# Patient Record
Sex: Male | Born: 1945
Health system: Southern US, Community
[De-identification: ages and names within clinical notes are randomized; demographics above are authoritative.]

## PROBLEM LIST (undated history)

## (undated) DIAGNOSIS — R51 Headache: Secondary | ICD-10-CM

## (undated) DIAGNOSIS — Z951 Presence of aortocoronary bypass graft: Secondary | ICD-10-CM

## (undated) DIAGNOSIS — I480 Paroxysmal atrial fibrillation: Secondary | ICD-10-CM

## (undated) DIAGNOSIS — R0602 Shortness of breath: Secondary | ICD-10-CM

## (undated) DIAGNOSIS — I5030 Unspecified diastolic (congestive) heart failure: Secondary | ICD-10-CM

## (undated) DIAGNOSIS — Z5189 Encounter for other specified aftercare: Secondary | ICD-10-CM

## (undated) DIAGNOSIS — J40 Bronchitis, not specified as acute or chronic: Secondary | ICD-10-CM

## (undated) DIAGNOSIS — IMO0001 Reserved for inherently not codable concepts without codable children: Secondary | ICD-10-CM

## (undated) DIAGNOSIS — IMO0002 Reserved for concepts with insufficient information to code with codable children: Secondary | ICD-10-CM

## (undated) DIAGNOSIS — K228 Other specified diseases of esophagus: Secondary | ICD-10-CM

## (undated) DIAGNOSIS — I4510 Unspecified right bundle-branch block: Secondary | ICD-10-CM

## (undated) DIAGNOSIS — I350 Nonrheumatic aortic (valve) stenosis: Secondary | ICD-10-CM

## (undated) DIAGNOSIS — I4891 Unspecified atrial fibrillation: Secondary | ICD-10-CM

## (undated) DIAGNOSIS — D649 Anemia, unspecified: Secondary | ICD-10-CM

## (undated) DIAGNOSIS — I499 Cardiac arrhythmia, unspecified: Secondary | ICD-10-CM

## (undated) DIAGNOSIS — K222 Esophageal obstruction: Secondary | ICD-10-CM

## (undated) DIAGNOSIS — G8929 Other chronic pain: Secondary | ICD-10-CM

## (undated) DIAGNOSIS — I219 Acute myocardial infarction, unspecified: Secondary | ICD-10-CM

## (undated) DIAGNOSIS — K449 Diaphragmatic hernia without obstruction or gangrene: Secondary | ICD-10-CM

## (undated) DIAGNOSIS — I2510 Atherosclerotic heart disease of native coronary artery without angina pectoris: Secondary | ICD-10-CM

## (undated) DIAGNOSIS — E785 Hyperlipidemia, unspecified: Secondary | ICD-10-CM

## (undated) DIAGNOSIS — K2289 Other specified disease of esophagus: Secondary | ICD-10-CM

## (undated) DIAGNOSIS — K219 Gastro-esophageal reflux disease without esophagitis: Secondary | ICD-10-CM

## (undated) DIAGNOSIS — T4145XA Adverse effect of unspecified anesthetic, initial encounter: Secondary | ICD-10-CM

## (undated) DIAGNOSIS — I701 Atherosclerosis of renal artery: Secondary | ICD-10-CM

## (undated) DIAGNOSIS — I771 Stricture of artery: Secondary | ICD-10-CM

## (undated) DIAGNOSIS — I10 Essential (primary) hypertension: Secondary | ICD-10-CM

## (undated) DIAGNOSIS — N189 Chronic kidney disease, unspecified: Secondary | ICD-10-CM

## (undated) DIAGNOSIS — K311 Adult hypertrophic pyloric stenosis: Secondary | ICD-10-CM

## (undated) DIAGNOSIS — K635 Polyp of colon: Secondary | ICD-10-CM

## (undated) DIAGNOSIS — R011 Cardiac murmur, unspecified: Secondary | ICD-10-CM

## (undated) DIAGNOSIS — M549 Dorsalgia, unspecified: Secondary | ICD-10-CM

## (undated) DIAGNOSIS — I509 Heart failure, unspecified: Secondary | ICD-10-CM

## (undated) DIAGNOSIS — I209 Angina pectoris, unspecified: Secondary | ICD-10-CM

## (undated) HISTORY — DX: Esophageal obstruction: K22.2

## (undated) HISTORY — DX: Paroxysmal atrial fibrillation: I48.0

## (undated) HISTORY — DX: Unspecified atrial fibrillation: I48.91

## (undated) HISTORY — DX: Polyp of colon: K63.5

## (undated) HISTORY — PX: ADENOIDECTOMY: SUR15

## (undated) HISTORY — DX: Atherosclerosis of renal artery: I70.1

## (undated) HISTORY — PX: OTHER SURGICAL HISTORY: SHX169

## (undated) HISTORY — DX: Presence of aortocoronary bypass graft: Z95.1

## (undated) HISTORY — DX: Unspecified right bundle-branch block: I45.10

## (undated) HISTORY — DX: Adult hypertrophic pyloric stenosis: K31.1

---
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
## 1970-03-07 HISTORY — PX: TONSILLECTOMY: SUR1361

## 1982-03-07 HISTORY — PX: CORONARY ARTERY BYPASS GRAFT: SHX141

## 1989-03-07 DIAGNOSIS — I219 Acute myocardial infarction, unspecified: Secondary | ICD-10-CM

## 1989-03-07 HISTORY — DX: Acute myocardial infarction, unspecified: I21.9

## 1994-03-07 HISTORY — PX: CAROTID ENDARTERECTOMY: SUR193

## 1994-03-07 HISTORY — PX: CORONARY ARTERY BYPASS GRAFT: SHX141

## 1996-02-13 ENCOUNTER — Encounter: Payer: Self-pay | Admitting: Internal Medicine

## 1998-03-09 ENCOUNTER — Encounter (INDEPENDENT_AMBULATORY_CARE_PROVIDER_SITE_OTHER): Payer: Self-pay | Admitting: *Deleted

## 1998-03-19 ENCOUNTER — Encounter (INDEPENDENT_AMBULATORY_CARE_PROVIDER_SITE_OTHER): Payer: Self-pay | Admitting: *Deleted

## 1998-04-07 ENCOUNTER — Encounter: Payer: Self-pay | Admitting: Internal Medicine

## 1998-04-07 ENCOUNTER — Ambulatory Visit (HOSPITAL_COMMUNITY): Admission: RE | Admit: 1998-04-07 | Discharge: 1998-04-07 | Payer: Self-pay | Admitting: Internal Medicine

## 1998-04-28 ENCOUNTER — Ambulatory Visit (HOSPITAL_COMMUNITY): Admission: RE | Admit: 1998-04-28 | Discharge: 1998-04-28 | Payer: Self-pay | Admitting: Internal Medicine

## 1998-04-28 ENCOUNTER — Encounter: Payer: Self-pay | Admitting: Internal Medicine

## 1998-05-26 ENCOUNTER — Encounter: Payer: Self-pay | Admitting: Internal Medicine

## 1998-05-26 ENCOUNTER — Ambulatory Visit (HOSPITAL_COMMUNITY): Admission: RE | Admit: 1998-05-26 | Discharge: 1998-05-26 | Payer: Self-pay | Admitting: Internal Medicine

## 1998-06-11 ENCOUNTER — Observation Stay (HOSPITAL_COMMUNITY): Admission: RE | Admit: 1998-06-11 | Discharge: 1998-06-12 | Payer: Self-pay | Admitting: Cardiovascular Disease

## 1998-06-11 ENCOUNTER — Encounter: Payer: Self-pay | Admitting: Cardiovascular Disease

## 1998-07-22 ENCOUNTER — Encounter: Payer: Self-pay | Admitting: Internal Medicine

## 1998-09-01 ENCOUNTER — Encounter: Payer: Self-pay | Admitting: Internal Medicine

## 1998-09-01 ENCOUNTER — Ambulatory Visit (HOSPITAL_COMMUNITY): Admission: RE | Admit: 1998-09-01 | Discharge: 1998-09-01 | Payer: Self-pay | Admitting: Internal Medicine

## 2001-12-24 ENCOUNTER — Encounter: Payer: Self-pay | Admitting: Internal Medicine

## 2002-01-02 ENCOUNTER — Encounter: Payer: Self-pay | Admitting: Internal Medicine

## 2002-01-14 ENCOUNTER — Encounter: Payer: Self-pay | Admitting: Internal Medicine

## 2002-01-14 ENCOUNTER — Ambulatory Visit (HOSPITAL_COMMUNITY): Admission: RE | Admit: 2002-01-14 | Discharge: 2002-01-14 | Payer: Self-pay | Admitting: Internal Medicine

## 2002-02-22 ENCOUNTER — Ambulatory Visit (HOSPITAL_COMMUNITY): Admission: RE | Admit: 2002-02-22 | Discharge: 2002-02-22 | Payer: Self-pay | Admitting: Internal Medicine

## 2002-02-22 ENCOUNTER — Encounter: Payer: Self-pay | Admitting: Internal Medicine

## 2003-06-10 ENCOUNTER — Encounter: Payer: Self-pay | Admitting: Internal Medicine

## 2003-06-23 ENCOUNTER — Ambulatory Visit (HOSPITAL_COMMUNITY): Admission: RE | Admit: 2003-06-23 | Discharge: 2003-06-23 | Payer: Self-pay | Admitting: Internal Medicine

## 2003-06-23 ENCOUNTER — Encounter: Payer: Self-pay | Admitting: Internal Medicine

## 2003-06-25 ENCOUNTER — Inpatient Hospital Stay (HOSPITAL_COMMUNITY): Admission: AD | Admit: 2003-06-25 | Discharge: 2003-06-26 | Payer: Self-pay | Admitting: Internal Medicine

## 2003-07-11 ENCOUNTER — Encounter (INDEPENDENT_AMBULATORY_CARE_PROVIDER_SITE_OTHER): Payer: Self-pay | Admitting: *Deleted

## 2003-07-11 ENCOUNTER — Ambulatory Visit (HOSPITAL_COMMUNITY): Admission: RE | Admit: 2003-07-11 | Discharge: 2003-07-11 | Payer: Self-pay | Admitting: Internal Medicine

## 2003-08-03 ENCOUNTER — Inpatient Hospital Stay (HOSPITAL_COMMUNITY): Admission: AD | Admit: 2003-08-03 | Discharge: 2003-08-06 | Payer: Self-pay | Admitting: Cardiology

## 2003-12-10 ENCOUNTER — Inpatient Hospital Stay (HOSPITAL_COMMUNITY): Admission: AD | Admit: 2003-12-10 | Discharge: 2003-12-18 | Payer: Self-pay | Admitting: Cardiology

## 2004-11-10 ENCOUNTER — Ambulatory Visit: Payer: Self-pay | Admitting: Internal Medicine

## 2005-12-29 ENCOUNTER — Ambulatory Visit: Payer: Self-pay | Admitting: Internal Medicine

## 2007-01-18 ENCOUNTER — Ambulatory Visit: Payer: Self-pay | Admitting: Internal Medicine

## 2007-03-08 HISTORY — PX: EYE SURGERY: SHX253

## 2007-04-08 HISTORY — PX: RETINAL DETACHMENT SURGERY: SHX105

## 2007-04-24 DIAGNOSIS — K219 Gastro-esophageal reflux disease without esophagitis: Secondary | ICD-10-CM

## 2007-04-24 DIAGNOSIS — I10 Essential (primary) hypertension: Secondary | ICD-10-CM

## 2007-04-24 DIAGNOSIS — K649 Unspecified hemorrhoids: Secondary | ICD-10-CM | POA: Insufficient documentation

## 2007-04-24 DIAGNOSIS — K319 Disease of stomach and duodenum, unspecified: Secondary | ICD-10-CM

## 2007-04-24 DIAGNOSIS — Z951 Presence of aortocoronary bypass graft: Secondary | ICD-10-CM

## 2007-04-24 DIAGNOSIS — K208 Other esophagitis: Secondary | ICD-10-CM

## 2007-04-24 DIAGNOSIS — E785 Hyperlipidemia, unspecified: Secondary | ICD-10-CM

## 2007-07-20 ENCOUNTER — Encounter: Payer: Self-pay | Admitting: Internal Medicine

## 2008-01-14 ENCOUNTER — Telehealth: Payer: Self-pay | Admitting: Internal Medicine

## 2008-01-30 ENCOUNTER — Ambulatory Visit: Payer: Self-pay | Admitting: Internal Medicine

## 2008-01-30 DIAGNOSIS — K2210 Ulcer of esophagus without bleeding: Secondary | ICD-10-CM | POA: Insufficient documentation

## 2008-01-30 DIAGNOSIS — K222 Esophageal obstruction: Secondary | ICD-10-CM

## 2008-06-26 ENCOUNTER — Telehealth: Payer: Self-pay | Admitting: Internal Medicine

## 2008-07-03 ENCOUNTER — Encounter: Payer: Self-pay | Admitting: Internal Medicine

## 2009-03-12 ENCOUNTER — Ambulatory Visit: Payer: Self-pay | Admitting: Internal Medicine

## 2009-03-12 DIAGNOSIS — R141 Gas pain: Secondary | ICD-10-CM

## 2009-03-12 DIAGNOSIS — R143 Flatulence: Secondary | ICD-10-CM

## 2009-03-12 DIAGNOSIS — R142 Eructation: Secondary | ICD-10-CM

## 2009-03-12 DIAGNOSIS — K4090 Unilateral inguinal hernia, without obstruction or gangrene, not specified as recurrent: Secondary | ICD-10-CM | POA: Insufficient documentation

## 2009-06-15 ENCOUNTER — Encounter: Payer: Self-pay | Admitting: Internal Medicine

## 2009-07-27 ENCOUNTER — Telehealth: Payer: Self-pay | Admitting: Internal Medicine

## 2009-07-29 ENCOUNTER — Encounter: Payer: Self-pay | Admitting: Internal Medicine

## 2009-08-06 ENCOUNTER — Telehealth: Payer: Self-pay | Admitting: Internal Medicine

## 2009-08-06 ENCOUNTER — Ambulatory Visit: Payer: Self-pay | Admitting: Internal Medicine

## 2009-08-06 DIAGNOSIS — R110 Nausea: Secondary | ICD-10-CM

## 2009-08-06 DIAGNOSIS — R1084 Generalized abdominal pain: Secondary | ICD-10-CM | POA: Insufficient documentation

## 2009-08-06 DIAGNOSIS — R109 Unspecified abdominal pain: Secondary | ICD-10-CM

## 2009-08-07 ENCOUNTER — Ambulatory Visit: Payer: Self-pay | Admitting: Cardiovascular Disease

## 2009-08-07 LAB — CONVERTED CEMR LAB
ALT: 17 units/L (ref 0–53)
AST: 19 units/L (ref 0–37)
Alkaline Phosphatase: 81 units/L (ref 39–117)
Amylase: 86 units/L (ref 27–131)
Basophils Absolute: 0 10*3/uL (ref 0.0–0.1)
Creatinine, Ser: 1.2 mg/dL (ref 0.4–1.5)
Eosinophils Absolute: 0.1 10*3/uL (ref 0.0–0.7)
Eosinophils Relative: 2.4 % (ref 0.0–5.0)
HCT: 39 % (ref 39.0–52.0)
Hemoglobin: 13.7 g/dL (ref 13.0–17.0)
Lipase: 10 units/L — ABNORMAL LOW (ref 11.0–59.0)
Lymphs Abs: 1.5 10*3/uL (ref 0.7–4.0)
MCV: 98.1 fL (ref 78.0–100.0)
Platelets: 155 10*3/uL (ref 150.0–400.0)
RDW: 13 % (ref 11.5–14.6)
Total Bilirubin: 0.6 mg/dL (ref 0.3–1.2)
Total Protein: 6.7 g/dL (ref 6.0–8.3)

## 2009-08-20 ENCOUNTER — Encounter: Payer: Self-pay | Admitting: Internal Medicine

## 2009-09-24 ENCOUNTER — Ambulatory Visit: Payer: Self-pay | Admitting: Internal Medicine

## 2010-01-19 ENCOUNTER — Telehealth: Payer: Self-pay | Admitting: Internal Medicine

## 2010-01-25 ENCOUNTER — Ambulatory Visit: Payer: Self-pay | Admitting: Internal Medicine

## 2010-01-25 DIAGNOSIS — K5900 Constipation, unspecified: Secondary | ICD-10-CM | POA: Insufficient documentation

## 2010-01-26 ENCOUNTER — Telehealth: Payer: Self-pay | Admitting: Internal Medicine

## 2010-01-27 ENCOUNTER — Encounter: Payer: Self-pay | Admitting: Internal Medicine

## 2010-02-04 ENCOUNTER — Ambulatory Visit: Payer: Self-pay | Admitting: Internal Medicine

## 2010-02-08 ENCOUNTER — Encounter: Payer: Self-pay | Admitting: Internal Medicine

## 2010-03-28 ENCOUNTER — Encounter: Payer: Self-pay | Admitting: Internal Medicine

## 2010-03-30 ENCOUNTER — Telehealth (INDEPENDENT_AMBULATORY_CARE_PROVIDER_SITE_OTHER): Payer: Self-pay | Admitting: *Deleted

## 2010-03-30 ENCOUNTER — Ambulatory Visit
Admission: RE | Admit: 2010-03-30 | Discharge: 2010-03-30 | Payer: Self-pay | Source: Home / Self Care | Attending: Internal Medicine | Admitting: Internal Medicine

## 2010-04-06 NOTE — Procedures (Signed)
Summary: [facility]  [facility]   Imported By: Phillis Knack 03/13/2009 08:32:33  _____________________________________________________________________  External Attachment:    Type:   Image     Comment:   External Document

## 2010-04-06 NOTE — Assessment & Plan Note (Signed)
Summary: Followup abd pain constipation   History of Present Illness Visit Type: Follow-up Visit Primary GI MD: Scarlette Shorts MD Primary Provider: Dewitt Hoes, MD Chief Complaint: abdominal pain is better, as is constipation.  Pt is having soft, formed stools. History of Present Illness:   65 year old white male with multiple medical problems including hyperlipidemia, hypertension, coronary artery disease status post CABG, status post enter coronary stent placement, GERD complicated by peptic stricture, erosive esophagitis, and hemorrhoids. He presents today after a recent evaluation for abdominal pain. As part of his workup he underwent multiple laboratories which were unremarkable. Also, a CT scan of the abdomen and pelvis which revealed vascular calcification and atheromatous changes as well as questionable narrowing at the takeoff of the celiac axis. He was also noted to have increased stool burden and complaint of constipation. Post CT scan 7 weeks ago, he has had no abdominal pain. He states he had multiple bowel movements after his CT contrast. He has been using MiraLax with good results. No other complaints or issues. His reflux is under good control with b.i.d. Nexium. No dysphagia.   GI Review of Systems    Reports abdominal pain.     Location of  Abdominal pain: lower abdomen.    Denies acid reflux, belching, bloating, chest pain, dysphagia with liquids, dysphagia with solids, heartburn, loss of appetite, nausea, vomiting, vomiting blood, weight loss, and  weight gain.      Reports constipation.     Denies anal fissure, black tarry stools, change in bowel habit, diarrhea, diverticulosis, fecal incontinence, heme positive stool, hemorrhoids, irritable bowel syndrome, jaundice, light color stool, liver problems, rectal bleeding, and  rectal pain.    Current Medications (verified): 1)  Nexium 40 Mg Cpdr (Esomeprazole Magnesium) .... Take 1 Tablet By Mouth Two Times A Day 2)  Atenolol 25  Mg Tabs (Atenolol) .... Take 1 Tablet Every Morning and 1/2 Tablet By Mouth Every Night. 3)  Nitro Pump Spray 0.4 Mg .... Take 1 Spray As Needed 4)  Plavix 75 Mg Tabs (Clopidogrel Bisulfate) .... Take 1 Tablet By Mouth Once A Day 5)  Aspirin 81 Mg Tbec (Aspirin) .... Take 1 Tablet By Mouth Once A Day 6)  Fiorinal 50-325-40 Mg Caps (Butalbital-Aspirin-Caffeine) .... Take 1 Tablet By Mouth As Needed 7)  Multivitamins  Tabs (Multiple Vitamin) .... Take 1 Tablet By Mouth Once A Day 8)  Vitamin E 200 Unit Caps (Vitamin E) .... Take 1 Tablet By Mouth Once A Day 9)  Lovastatin 40 Mg Tabs (Lovastatin) .... Take 1 Tablet By Mouth Once A Day 10)  Norvasc 2.5 Mg Tabs (Amlodipine Besylate) .... Take 1 Tablet By Mouth Two Times A Day 11)  Promethazine Hcl 25 Mg Tabs (Promethazine Hcl) .... Take 1/2 To 1 Tab  Every 6 Hours As Needed For Nausea 12)  Carafate 1 Gm/76ml Susp (Sucralfate) .... Take 1 Gram Between Meals and At Bedtime  Allergies (verified): 1)  ! Sulfa  Past History:  Past Medical History: Reviewed history from 08/06/2009 and no changes required. Current Problems:  HYPERLIPIDEMIA (ICD-272.4) HYPERTENSION (ICD-401.9) HEMORRHOIDS (ICD-455.6) CORONARY ARTERY DISEASE (ICD-414.00)-S/P CABG, AND MULTIPLE STENTS MYOCARDIAL INFARCTION, HX OF (ICD-412) PEPTIC STRICTURE (ICD-537.89) EROSIVE ESOPHAGITIS (ICD-530.19) GASTROESOPHAGEAL REFLUX DISEASE (ICD-530.81)  Past Surgical History: Reviewed history from 03/12/2009 and no changes required. coronary artery stenting (7 stents) quadruple bypass x 2 (1984, 1996) appendectomy pylondial cyst removal   Family History: No FH of Colon Cancer Family History of Heart Disease: father Family History of Diabetes: Sister  Social History: Married, 2 boys Retired Patient is a former smoker. -stopped 2006 Alcohol Use - no Illicit Drug Use - no Patient gets regular exercise.  Review of Systems  The patient denies allergy/sinus, anemia,  anxiety-new, arthritis/joint pain, back pain, blood in urine, breast changes/lumps, confusion, cough, coughing up blood, depression-new, fainting, fatigue, fever, headaches-new, hearing problems, heart murmur, heart rhythm changes, itching, muscle pains/cramps, night sweats, nosebleeds, shortness of breath, skin rash, sleeping problems, sore throat, swelling of feet/legs, swollen lymph glands, thirst - excessive, urination - excessive, urination changes/pain, urine leakage, vision changes, and voice change.    Vital Signs:  Patient profile:   65 year old male Height:      67 inches Weight:      152 pounds BMI:     23.89 Pulse rate:   56 / minute Pulse rhythm:   regular BP sitting:   120 / 64  (right arm) Cuff size:   regular  Vitals Entered By: Abelino Derrick CMA Deborra Medina) (September 24, 2009 9:47 AM)  Physical Exam  General:  Well developed, well nourished, no acute distress. Head:  Normocephalic and atraumatic. Eyes:  PERRLA, no icterus. Mouth:  no thrush Neck:  Supple; no masses or thyromegaly. Lungs:  Clear throughout to auscultation. Heart:  Regular rate and rhythm; no murmurs, rubs,  or bruits. Abdomen:  Soft, nontender and nondistended. No masses, hepatosplenomegaly or hernias noted. Normal bowel sounds. Pulses:  Normal pulses noted. Extremities:  no edema Neurologic:  alert and oriented Skin:  no jaundice Psych:  Alert and cooperative. Normal mood and affect.   Impression & Recommendations:  Problem # 1:  ABDOMINAL PAIN-MULTIPLE SITES (ICD-789.09) problems with abdominal pain seemingly secondary to constipation. Problem resolved with bowel regulation from MiraLax. Negative extensive workup otherwise.  Plan: #1. Continue MiraLax. Titrate to need #2. Followup p.r.n. recurrent pain  Problem # 2:  GASTROESOPHAGEAL REFLUX DISEASE (123456) GERD complicated by erosive esophagitis and peptic stricture. Asymptomatic post dilation on Nexium.   Plan: #1. Continue Nexium 40  mg b.i.d. #2. Repeat esophageal dilation p.r.n. recurrent dysphagia #3. Routine office followup in about one year  Patient Instructions: 1)  Please schedule a follow-up appointment in 1 year. 2)  The medication list was reviewed and reconciled.  All changed / newly prescribed medications were explained.  A complete medication list was provided to the patient / caregiver. 3)  Copy: Dr. Dewitt Hoes

## 2010-04-06 NOTE — Letter (Signed)
Summary: [facility]   Imported By: Phillis Knack 03/13/2009 08:55:56  _____________________________________________________________________  External Attachment:    Type:   Image     Comment:   External Document

## 2010-04-06 NOTE — Procedures (Signed)
Summary: [facility]  [facility]   Imported By: Phillis Knack 03/13/2009 08:35:19  _____________________________________________________________________  External Attachment:    Type:   Image     Comment:   External Document

## 2010-04-06 NOTE — Op Note (Signed)
Summary: [facility]   Imported By: Phillis Knack 03/13/2009 08:46:36  _____________________________________________________________________  External Attachment:    Type:   Image     Comment:   External Document

## 2010-04-06 NOTE — Assessment & Plan Note (Signed)
Summary: abdominal pain/constipation/GERD/[place]   History of Present Illness Visit Type: Follow-up Visit Primary GI MD: Scarlette Shorts MD Primary Provider: Dewitt Hoes, MD Chief Complaint: abdominal pain, constipation & GERD History of Present Illness:    PLEASANT 65 YO MALE  KNOWN TO DR. PERRY WITH HX OF CHRONIC GERD/ESOPHAGEAL STRICTURE. HE HAS HAD SEVERAL DILATIONS IN THE PAST. HE HAD A COLONOSCOPY  IN 2003 WHICH WAS NORMAL. HE HAS SIGNIFICANT VASCULAR DISEASE-IS S/P CABG, AND STENTS X 7-MAINTAINED ON ASA/PLAVIX. HE HAS RECENTLY SEEN DR. INGRAM FOR A RIGHT INGUINAL HERNIA,FOUND ALSO TO HAVE A SMALL LEFT INGUINAL HERNIA AND IS TO HAVE REPAIR SOON. HE COMES IN TODAY AFTER ONSET OF ABDOMINAL PAIN 9 DAYS AGO. THIS OCCURRED AFTER EATING-DEVELOPED A PAIN IN THE MID ABDOMEN, THEN DIARRHEA. HE HAD CHURNING ABDOMINAL DISCOMFORT AFTER THAT,DEVELOPED BURNING AND HES BEEN HAVING BURNING UPPER ABDOMINAL PAIN EVER SINCE WHICH IS REFRACTORY TO HIS two times a day NEXIUM,TUMS,MAALOX ETC. AFTER THAT EPISODE HE CONTINUED TO HURT, ALSO SORE IN THE LOWE ABDOMEN AND DID NOT HAVE A BM FOR SEVERAL DAYS. HE TOOK MAG CITRATE SUNDAY-HAD 7 BMS TO THE POINT OF DIARRHEA, AND HASNT HAD ANOTHER BM SINCE.HE FEELS WORSE ON AN EMPTY STOMACH OR AFTER EATING. NO FEVER/CHILLS, NO MELENA/HEME.FEELS PRESSURE UNDER HIS RIBS AS WELL.   GI Review of Systems    Reports abdominal pain, acid reflux, and  bloating.     Location of  Abdominal pain: epigastric area.    Denies belching, chest pain, dysphagia with liquids, dysphagia with solids, heartburn, loss of appetite, nausea, vomiting, vomiting blood, weight loss, and  weight gain.      Reports constipation.     Denies anal fissure, black tarry stools, change in bowel habit, diarrhea, diverticulosis, fecal incontinence, heme positive stool, hemorrhoids, irritable bowel syndrome, jaundice, light color stool, liver problems, rectal bleeding, and  rectal pain.    Current Medications  (verified): 1)  Nexium 40 Mg Cpdr (Esomeprazole Magnesium) .... Take 1 Tablet By Mouth Two Times A Day 2)  Atenolol 25 Mg Tabs (Atenolol) .... Take 1 Tablet Every Morning and 1/2 Tablet By Mouth Every Night. 3)  Nitro Pump Spray 0.4 Mg .... Take 1 Spray As Needed 4)  Plavix 75 Mg Tabs (Clopidogrel Bisulfate) .... Take 1 Tablet By Mouth Once A Day 5)  Aspirin 81 Mg Tbec (Aspirin) .... Take 1 Tablet By Mouth Once A Day 6)  Fiorinal 50-325-40 Mg Caps (Butalbital-Aspirin-Caffeine) .... Take 1 Tablet By Mouth As Needed 7)  Multivitamins  Tabs (Multiple Vitamin) .... Take 1 Tablet By Mouth Once A Day 8)  Vitamin E 200 Unit Caps (Vitamin E) .... Take 1 Tablet By Mouth Once A Day 9)  Lovastatin 40 Mg Tabs (Lovastatin) .... Take 1 Tablet By Mouth Once A Day 10)  Norvasc 2.5 Mg Tabs (Amlodipine Besylate) .... Take 1 Tablet By Mouth Two Times A Day  Allergies (verified): 1)  ! Sulfa  Past History:  Past Medical History: Current Problems:  HYPERLIPIDEMIA (ICD-272.4) HYPERTENSION (ICD-401.9) HEMORRHOIDS (ICD-455.6) CORONARY ARTERY DISEASE (ICD-414.00)-S/P CABG, AND MULTIPLE STENTS MYOCARDIAL INFARCTION, HX OF (ICD-412) PEPTIC STRICTURE (ICD-537.89) EROSIVE ESOPHAGITIS (ICD-530.19) GASTROESOPHAGEAL REFLUX DISEASE (ICD-530.81)  Past Surgical History: Reviewed history from 03/12/2009 and no changes required. coronary artery stenting (7 stents) quadruple bypass x 2 (1984, 1996) appendectomy pylondial cyst removal   Family History: Reviewed history from 03/12/2009 and no changes required. No FH of Colon Cancer Family History of Heart Disease: father  Social History: Reviewed history from 01/30/2008 and no changes  required. Patient is a former smoker. -stopped 2006 Alcohol Use - no Illicit Drug Use - no Patient gets regular exercise.  Review of Systems       The patient complains of allergy/sinus, back pain, cough, headaches-new, muscle pains/cramps, and swelling of feet/legs.  The  patient denies anemia, anxiety-new, arthritis/joint pain, blood in urine, breast changes/lumps, change in vision, confusion, coughing up blood, depression-new, fainting, fatigue, fever, hearing problems, heart murmur, heart rhythm changes, itching, menstrual pain, night sweats, nosebleeds, pregnancy symptoms, shortness of breath, skin rash, sleeping problems, sore throat, swollen lymph glands, thirst - excessive, urination - excessive, urination changes/pain, urine leakage, vision changes, and voice change.         ROS OTHERWISE AS IN HPI  Vital Signs:  Patient profile:   65 year old male Height:      67 inches Weight:      151.25 pounds BMI:     23.77 Pulse rate:   72 / minute Pulse rhythm:   regular BP sitting:   110 / 54  (right arm) Cuff size:   regular  Vitals Entered By: June McMurray [place] Deborra Medina) (August 06, 2009 3:21 PM)  Physical Exam  General:  Well developed, well nourished, no acute distress. Head:  Normocephalic and atraumatic. Eyes:  PERRLA, no icterus. Lungs:  Clear throughout to auscultation. Heart:  Regular rate and rhythm; no murmurs, rubs,  or bruits. Abdomen:  SOFT, PROMINENT AORTIC PULSATION IN UPPER ABDOMEN, TENDER ACROSS UPPER ABDOMEN,BS+, NO PALP MASS OR HSM, NN FOCAL TENDERNESS IN LOWER ABD. Rectal:  BROWN HEME NEGATIVE STOOL Extremities:  No clubbing, cyanosis, edema or deformities noted. Neurologic:  Alert and  oriented x4;  grossly normal neurologically. Psych:  Alert and cooperative. Normal mood and affect.   Impression & Recommendations:  Problem # 1:  ABDOMINAL PAIN, GENERALIZED (ICD-789.07) Assessment New 65 YO MALE WITH 9 DAYS HX OF BURNING UPPER ABDOMINAL PAIN, NAUSEA,ALTERED BOWEL HABITS AND LOWER ABDOMINAL PAIN  BILATERALLY ETIOLOGY UNCLEAR.,IN PT ON CHRONIC two times a day NEXIUM WITH VASCULAR DISEASE  LABS AS BELOW ADD CARAFATE LIQUID 1 GM BETWEEN MEALS AND BEDTIME CONTINUE NEXIUM 40 MG TWICE DAILY PHENERGAN 12.5-25 MG Q 6 HOURS AS NEEDED FOR  NAUSEA SCHEDULE FOR CT ABD/[facility]LVIS TOMORROW-FURTHER PLANS PENDING RESULTS OF ABOVE   Orders: TLB-CMP (Comprehensive Metabolic Pnl) (0000000) TLB-Amylase (82150-AMYL) TLB-CBC Platelet - w/Differential (85025-CBCD) TLB-Lipase (83690-LIPASE) CT Abdomen/Pelvis with Contrast (CT Abd/Pelvis w/con)  Problem # 2:  INGUINAL HERNIA (ICD-550.90) Assessment: Comment Only BILATERAL  Problem # 3:  CORONARY ARTERY DISEASE (ICD-414.00) Assessment: Comment Only  Problem # 4:  HYPERTENSION (ICD-401.9) Assessment: Comment Only  Patient Instructions: 1)  We sent a perscriptionf or Phenergan for nausea and [facility] [place]. 2)  We scheduled the CT scan at [place], [address] for tomorrow, 08-07-09 at [employer].  Directions and contrast given. 3)  cc: Dewitt Hoes MD 4)  The medication list was reviewed and reconciled.  All changed / newly prescribed medications were explained.  A complete medication list was provided to the patient / caregiver. Prescriptions: CARAFATE 1 GM/10ML SUSP (SUCRALFATE) Take 1 gram between meals and at bedtime  #40 grams x 1   Entered by:   Marisue Humble NCMA   Authorized by:   Alfredia Ferguson PA-c   Signed by:   Marisue Humble NCMA on 08/06/2009   Method used:   [facility]        [facility] [id number]  [address], [place]  [address]       Ph: [phone] or [phone]       Fax: [phone]   RxID:   [id number] CARAFATE 1 GM/10ML SUSP (SUCRALFATE) Take 1 gram between meals and at bedtime  #120 grams x 1   Entered by:   Marisue Humble NCMA   Authorized by:   Alfredia Ferguson PA-c   Signed by:   Marisue Humble NCMA on 08/06/2009   Method used:   [facility]        [facility] [id number]       [address], [place]  [address]       Ph: [phone] or [phone]       Fax: [phone]   RxID:   [id number] PROMETHAZINE HCL 25 MG TABS (PROMETHAZINE HCL) Take 1/2 to 1 tab   every 6 hours as needed for nausea  #45 x 0   Entered by:   Marisue Humble NCMA   Authorized by:   Alfredia Ferguson PA-c   Signed by:   Marisue Humble NCMA on 08/06/2009   Method used:   [facility]        [facility] [id number]       [address], [place]  [address]       Ph: [phone] or [phone]       Fax: [phone]   RxID:   [id number]  Revised the Carafate perscription for 10 days.

## 2010-04-06 NOTE — Letter (Signed)
Summary: [facility]   Imported By: Bubba Hales 09/04/2009 07:57:54  _____________________________________________________________________  External Attachment:    Type:   Image     Comment:   External Document

## 2010-04-06 NOTE — Progress Notes (Signed)
----   Converted from flag ---- ---- 01/25/2010 4:05 PM, Randye Lobo NCMA wrote:   ---- 01/25/2010 4:04 PM, Randye Lobo NCMA wrote: Check with [name] office regarding Plavix. Procedure is 02/04/10 ------------------------------  Phone Note Outgoing Call   Call placed by: Randye Lobo [place],  January 26, 2010 12:58 PM Call placed to: Patient Summary of Call: called Dr Lowella Fairy office to see if holding Plavix is ok prior to his procedure on 02/04/10.  The office will call me back with answer.  Initial call taken by: Randye Lobo [place],  January 26, 2010 12:59 PM  Follow-up for Phone Call        Pt. notified.   Follow-up by: Randye Lobo NCMA,  January 27, 2010 8:37 AM

## 2010-04-06 NOTE — Letter (Signed)
Summary: [facility]   Imported By: Phillis Knack 07/06/2009 11:51:09  _____________________________________________________________________  External Attachment:    Type:   Image     Comment:   External Document

## 2010-04-06 NOTE — Assessment & Plan Note (Signed)
Summary: Abdominal pain followup   History of Present Illness Visit Type: Follow-up Visit Primary GI MD: Scarlette Shorts MD Primary Yoshie Kosel: Garwin Brothers, MD Chief Complaint: abdominal pain since June History of Present Illness:   65 year old white male with multiple medical problems including hyperlipidemia, hypertension, coronary artery disease status post coronary artery bypass grafting, status post coronary artery stent placement for which she is on aspirin and Plavix therapy, GERD complicated by erosive esophagitis and peptic stricture for which he is on Nexium b.i.d., chronic intermittent abdominal complaints with tendency toward constipation, and hemorrhoids. He presents today with ongoing complaints of abdominal discomfort. He was evaluated by the extender in June and again by myself in July. At that time, his problem was seemingly related to constipation and improved with bowel regulation and MiraLax. More recently contacted the office complaining of mid to upper abdominal discomfort exacerbated by movements. He was seen in the emergency room at [facility]. Review of outside records shows unremarkable CBC, liver function tests, and lipase. Negative plain films. His discomfort was felt to be musculoskeletal. He follows up at this time. He seems to be complaining of bloating discomfort. Also discomfort with direct palpation. Not clear that his discomfort is exacerbated by meals. She does have some mesenteric vascular calcification. He denies food avoidance. His weight has been stable (down 2 pounds in 6 months). Still with some intermittent constipation. Last colonoscopy in 2003.   GI Review of Systems    Reports abdominal pain and  bloating.     Location of  Abdominal pain: generalized abdominal pain.    Denies belching, chest pain, dysphagia with liquids, dysphagia with solids, heartburn, loss of appetite, nausea, vomiting, vomiting blood, weight loss, and  weight gain.      Reports  constipation.     Denies anal fissure, black tarry stools, change in bowel habit, diarrhea, diverticulosis, fecal incontinence, heme positive stool, hemorrhoids, irritable bowel syndrome, jaundice, light color stool, liver problems, rectal bleeding, and  rectal pain.    Current Medications (verified): 1)  Nexium 40 Mg Cpdr (Esomeprazole Magnesium) .... Take 1 Tablet By Mouth Two Times A Day 2)  Atenolol 25 Mg Tabs (Atenolol) .... Take 1 Tablet Every Morning and 1/2 Tablet By Mouth Every Night. 3)  Nitro Pump Spray 0.4 Mg .... Take 1 Spray As Needed 4)  Plavix 75 Mg Tabs (Clopidogrel Bisulfate) .... Take 1 Tablet By Mouth Once A Day 5)  Aspirin 81 Mg Tbec (Aspirin) .... Take 1 Tablet By Mouth Once A Day 6)  Fiorinal 50-325-40 Mg Caps (Butalbital-Aspirin-Caffeine) .... Take 1 Tablet By Mouth As Needed 7)  Multivitamins  Tabs (Multiple Vitamin) .... Take 1 Tablet By Mouth Once A Day 8)  Vitamin E 200 Unit Caps (Vitamin E) .... Take 1 Tablet By Mouth Once A Day 9)  Lovastatin 40 Mg Tabs (Lovastatin) .... Take 1 Tablet By Mouth Once A Day 10)  Norvasc 2.5 Mg Tabs (Amlodipine Besylate) .... Take 1 Tablet By Mouth Two Times A Day 11)  Promethazine Hcl 25 Mg Tabs (Promethazine Hcl) .... Take 1/2 To 1 Tab  Every 6 Hours As Needed For Nausea  Allergies (verified): 1)  ! Sulfa  Past History:  Past Medical History: Reviewed history from 08/06/2009 and no changes required. Current Problems:  HYPERLIPIDEMIA (ICD-272.4) HYPERTENSION (ICD-401.9) HEMORRHOIDS (ICD-455.6) CORONARY ARTERY DISEASE (ICD-414.00)-S/P CABG, AND MULTIPLE STENTS MYOCARDIAL INFARCTION, HX OF (ICD-412) PEPTIC STRICTURE (ICD-537.89) EROSIVE ESOPHAGITIS (ICD-530.19) GASTROESOPHAGEAL REFLUX DISEASE (ICD-530.81)  Past Surgical History: Reviewed history from 03/12/2009 and  no changes required. coronary artery stenting (7 stents) quadruple bypass x 2 (1984, 1996) appendectomy pylondial cyst removal   Family History: Reviewed  history from 09/24/2009 and no changes required. No FH of Colon Cancer Family History of Heart Disease: father Family History of Diabetes: Sister  Social History: Reviewed history from 09/24/2009 and no changes required. Married, 2 boys Retired Patient is a former smoker. -stopped 2006 Alcohol Use - no Illicit Drug Use - no Patient gets regular exercise.  Review of Systems       The patient complains of back pain.  The patient denies allergy/sinus, anemia, anxiety-new, arthritis/joint pain, blood in urine, breast changes/lumps, change in vision, confusion, cough, coughing up blood, depression-new, fainting, fatigue, fever, headaches-new, hearing problems, heart murmur, heart rhythm changes, itching, menstrual pain, muscle pains/cramps, night sweats, nosebleeds, pregnancy symptoms, shortness of breath, skin rash, sleeping problems, sore throat, swelling of feet/legs, swollen lymph glands, thirst - excessive , urination - excessive , urination changes/pain, urine leakage, vision changes, and voice change.    Vital Signs:  Patient profile:   65 year old male Height:      67 inches Weight:      148 pounds BMI:     23.26 Pulse rate:   56 / minute Pulse rhythm:   regular BP sitting:   122 / 64  (right arm) Cuff size:   regular  Vitals Entered By: Abelino Derrick CMA Deborra Medina) (January 25, 2010 1:31 PM)  Physical Exam  General:  Well developed, well nourished, no acute distress. Head:  Normocephalic and atraumatic. Eyes:  anicteric Mouth:  No deformity or lesions. Neck:  Supple; no masses or thyromegaly. Lungs:  Clear throughout to auscultation. Heart:  Regular rate and rhythm; no murmurs, rubs,  or bruits. Abdomen:  Soft, nontender and nondistended. No masses, hepatosplenomegaly noted. Normal bowel sounds. Rectal:  deferred Msk:  Symmetrical with no gross deformities. Normal posture. Pulses:  Normal pulses noted. Extremities:  No clubbing, cyanosis, edema or deformities  noted. Neurologic:  Alert and  oriented x4. Skin:  Intact without significant lesions or rashes. Psych:  Alert and cooperative. Normal mood and affect.   Impression & Recommendations:  Problem # 1:  ABDOMINAL PAIN, GENERALIZED (ICD-789.07) ongoing complaints of abdominal pain/discomfort. Initially felt to be related to constipation. May have an element of irritable bowel. Story not great for intestinal angina, though he is at significant risk and has abnormal mesenteric vascular calcification noted on CT.  Plan: #1. Colonoscopy. The nature of the procedure as well as the risks, benefits, and alternatives were reviewed. He understood and agreed to proceed. #2. Movi prep prescribed. The patient instructed on its use #3. Upper endoscopy. #4. We will check with the patient's cardiologist, Dr. Rollene Fare, regarding the feasibility of holding Plavix therapy but maintaining aspirin therapy for the patient's procedures. Letter sent. Potential risks of continuing Plavix as well as holding Plavix for a short while discussed with the patient. He understands the issues. He prefers Dr. Rollene Fare decide on whether to hold therapy.  Problem # 2:  SCREENING COLORECTAL-CANCER (ICD-V76.51) last exam in 2003. Negative.  Problem # 3:  GASTROESOPHAGEAL REFLUX DISEASE (123456) GERD complicated by peptic stricture. No active GERD symptoms were recurrent dysphagia at this time. However, plan upper endoscopy, as noted, to evaluate ongoing pain  Problem # 4:  PEPTIC STRICTURE (ICD-537.89) Assessment: Comment Only  Problem # 5:  CORONARY ARTERY DISEASE (ICD-414.00) on Plavix therapy. Will discuss with Dr. Rollene Fare the feasibility of holding Plavix 5 days prior  to the exam.  Other Orders: Colon/Endo (Colon/Endo)  Patient Instructions: 1)  Colon/endo [place] 02/04/10 11:30 am  2)  Movi prep instructions given to patient.\par 3)  Movi prep Rx. sent to pharmacy. 4)  Colonoscopy and Flexible Sigmoidoscopy brochure  given.  5)  Upper Endoscopy brochure given.  6)  A Letter will be Sent to Dr. Rollene Fare for approval to hold your Plavix.  Our office will call you with instructions. 7)  Copy sent to : Garwin Brothers, MD 8)  The medication list was reviewed and reconciled.  All changed / newly prescribed medications were explained.  A complete medication list was provided to the patient / caregiver. Prescriptions: MOVIPREP 100 GM  SOLR (PEG-KCL-NACL-NASULF-NA ASC-C) As per prep instructions.  #1 x 0   Entered by:   Randye Lobo NCMA   Authorized by:   Irene Shipper MD   Signed by:   Randye Lobo NCMA on 01/25/2010   Method used:   [facility]ron[facility]        [id number]       [address], [place]  [address]       Ph: [phone] or [phone]       Fax: [phone]   RxID:   [id number]

## 2010-04-06 NOTE — Procedures (Signed)
Summary: [facility]  [facility]   Imported By: Phillis Knack 03/13/2009 08:44:30  _____________________________________________________________________  External Attachment:    Type:   Image     Comment:   External Document

## 2010-04-06 NOTE — Progress Notes (Signed)
Summary: Nexium prior auth  Phone Note Call from Patient Call back at [facility] Phone [phone]   Call For: Dr Henrene Pastor Reason for Call: Refill Medication Summary of Call: [facility] [address] [facility] [place] told him they have faxed [place] twice for the yearly pre auth for his Nexium and we have not responded. Initial call taken by: Irwin Brakeman [facility],  Jul 27, 2009 3:58 PM  Follow-up for Phone Call        Called patient and informed him that I received the P.A. fax on 07-27-09 and that I am now in process of getting this approved.  I will contact him when it is complete.  Randye Lobo NCMA  Jul 29, 2009 9:05 AM

## 2010-04-06 NOTE — Letter (Signed)
Summary: Patient Notice- Polyp Results  [facility]  [address] [place], [place] [address]   Phone: [phone]  Fax: [phone]        February 08, 2010 MRN: [id number]    [facility] [address] [place], [place]  [address]    Dear Mr. JUHASZ,  I am pleased to inform you that the colon polyp(s) removed during your recent colonoscopy was (were) found to be benign (no cancer detected) upon pathologic examination.  I recommend you have a repeat colonoscopy examination in 5 years to look for recurrent polyps, as having colon polyps increases your risk for having recurrent polyps or even colon cancer in the future.  Should you develop new or worsening symptoms of abdominal pain, bowel habit changes or bleeding from the rectum or bowels, please schedule an evaluation with either your primary care physician or with me.  Additional information/recommendations:  __ No further action with gastroenterology is needed at this time. Please      follow-up with your primary care physician for your other healthcare      needs.    Please call us if you are having persistent problems or have questions about your condition that have not been fully answered at this time.  Sincerely,  Irene Shipper MD  This letter has been electronically signed by your physician.  Appended Document: Patient Notice- Polyp Results Letter mailed

## 2010-04-06 NOTE — Progress Notes (Signed)
Summary: [name]   Imported By: Phillis Knack 03/13/2009 08:51:05  _____________________________________________________________________  External Attachment:    Type:   Image     Comment:   External Document

## 2010-04-06 NOTE — Assessment & Plan Note (Signed)
Summary: YEARLY F-UP, GAS, BULGE & NEXIUM REFILL   History of Present Illness Visit Type: Follow-up Visit Primary GI MD: Scarlette Shorts MD Primary Provider: Dewitt Hoes, MD Chief Complaint: Patient here for yearly follow up on his gerd, no complaints at this time.  History of Present Illness:   65 year old white male with multiple significant medical problems including coronary artery disease status post myocardial infarction, status post coronary artery bypass grafting, status post coronary artery stent placement, hypertension, hyperlipidemia, and erosive esophagitis with peptic stricture. He presents today for evaluation of multiple issues. First, routine followup regarding his GERD with esophagitis and peptic stricture. He continues to take Nexium 40 mg b.i.d. He requires this dosage to control symptoms as well as maintain esophageal healing. He occasionally has breakthrough symptoms with dietary indiscretion. He states that he only has mild intermittent dysphagia which is rare and has not changed over the past year. He does not feel that it warrants repeat dilation of present. He is on aspirin Plavix. Next, he has a new complaint of increased intestinal gas. He reports that when he awakes in the morning and stands up he passes flatus. Thereafter no additional problems. He denies any change in diet. No, pain or weight loss. No obvious relieving or exacerbating factors. Next, he complains about a bulge in the right lower quadrant the is occasionally uncomfortable but not painful. He wishes evaluation. Finally, he is checking him regarding followup screening colonoscopy. His initial colonoscopy was in October 2003. Examination was entirely normal. There is no family history of colon cancer. He denies lower GI complaints including bleeding.Marland Kitchen He reports that his chronic medical problems are all stable.   GI Review of Systems    Reports acid reflux and  dysphagia with solids.      Denies abdominal pain,  belching, bloating, chest pain, dysphagia with liquids, heartburn, loss of appetite, nausea, vomiting, vomiting blood, weight loss, and  weight gain.        Denies anal fissure, black tarry stools, change in bowel habit, constipation, diarrhea, diverticulosis, fecal incontinence, heme positive stool, hemorrhoids, irritable bowel syndrome, jaundice, light color stool, liver problems, rectal bleeding, and  rectal pain.    Current Medications (verified): 1)  Nexium 40 Mg Cpdr (Esomeprazole Magnesium) .... Take 1 Tablet By Mouth Two Times A Day 2)  Atenolol 25 Mg Tabs (Atenolol) .... Take 1 Tablet Every Morning and 1/2 Tablet By Mouth Every Night. 3)  Nitro Pump Spray 0.4 Mg .... Take 1 Spray As Needed 4)  Plavix 75 Mg Tabs (Clopidogrel Bisulfate) .... Take 1 Tablet By Mouth Once A Day 5)  Aspirin 81 Mg Tbec (Aspirin) .... Take 1 Tablet By Mouth Once A Day 6)  Fiorinal 50-325-40 Mg Caps (Butalbital-Aspirin-Caffeine) .... Take 1 Tablet By Mouth As Needed 7)  Multivitamins  Tabs (Multiple Vitamin) .... Take 1 Tablet By Mouth Once A Day 8)  Vitamin E 200 Unit Caps (Vitamin E) .... Take 1 Tablet By Mouth Once A Day 9)  Lovastatin 40 Mg Tabs (Lovastatin) .... Take 1 Tablet By Mouth Once A Day 10)  Norvasc 2.5 Mg Tabs (Amlodipine Besylate) .... Take 1 Tablet By Mouth Two Times A Day  Allergies (verified): 1)  ! Sulfa  Past History:  Past Medical History: Reviewed history from 04/24/2007 and no changes required. Current Problems:  HYPERLIPIDEMIA (ICD-272.4) HYPERTENSION (ICD-401.9) HEMORRHOIDS (ICD-455.6) CORONARY ARTERY DISEASE (ICD-414.00) MYOCARDIAL INFARCTION, HX OF (ICD-412) PEPTIC STRICTURE (ICD-537.89) EROSIVE ESOPHAGITIS (ICD-530.19) GASTROESOPHAGEAL REFLUX DISEASE (ICD-530.81)  Past Surgical History: coronary  artery stenting (7 stents) quadruple bypass x 2 (1984, 1996) appendectomy pylondial cyst removal   Family History: No FH of Colon Cancer Family History of Heart Disease:  father  Social History: Reviewed history from 01/30/2008 and no changes required. Patient is a former smoker. -stopped 2006 Alcohol Use - no Illicit Drug Use - no Patient gets regular exercise.  Review of Systems       entire non-GI review of systems is negative.  Vital Signs:  Patient profile:   65 year old male Height:      67 inches Weight:      155.8 pounds BMI:     24.49 Pulse rate:   64 / minute Pulse rhythm:   regular BP sitting:   124 / 62  (right arm) Cuff size:   regular  Vitals Entered By: Bernita Buffy CMA Deborra Medina) (March 12, 2009 10:04 AM)  Physical Exam  General:  Well developed, well nourished, no acute distress. Head:  Normocephalic and atraumatic. Eyes:  PERRLA, no icterus. Nose:  No deformity, discharge,  or lesions. Mouth:  No deformity or lesions, poor dentition  Neck:  Supple; no masses or thyromegaly. Lungs:  Clear throughout to auscultation. Heart:  Regular rate and rhythm; no murmurs, rubs,  or bruits. Abdomen:  Soft, nontender and nondistended. No masses, hepatosplenomegaly or hernias noted. Normal bowel sounds. Msk:  Symmetrical with no gross deformities. Normal posture. Pulses:  Normal pulses noted. Extremities:  No clubbing, cyanosis, edema or deformities noted. Neurologic:  Alert and  oriented x4;  grossly normal neurologically. Skin:  Intact without significant lesions or rashes. Psych:  Alert and cooperative. Normal mood and affect.   Impression & Recommendations:  Problem # 1:  GASTROESOPHAGEAL REFLUX DISEASE (ICD-530.81) GERD. Symptoms fairly well controlled with b.i.d. Nexium. History of severe erosive esophagitis.  Plan: #1. Continue Nexium 40 mg b.i.d. Previous prescription with multiple refills electronically submitted #2. Reflux precautions #3. Antacids p.r.n. for breakthrough  Problem # 2:  ESOPHAGEAL STRICTURE (ICD-530.3) significant stricturing in past requiring dilation. Minimal dysphagia unchanged for at least one  year.  Plan: #1. Continue Nexium #2. Esophageal dilation as needed for worsening dysphagia #3. chew food well given poor dentition  Problem # 3:  INGUINAL HERNIA (ICD-550.90) physical exam reveals reducible right inguinal hernia.  Plan:  #1. Surgical referral for evaluation. The patient sees Dr. Sherald Hess in [place]. He prefers to make the appointment. He was advised to proceed to the emergency room if he were to develop severe pain.  Problem # 4:  SCREENING COLORECTAL-CANCER (ICD-V76.51) I reviewed the patient's previous colonoscopy. Exam was normal. He is at baseline risk. He is asymptomatic. No family history. As such, we have changed his colonoscopy anniversary date to 10 years from the index exam based on current guidelines. His followup is now scheduled for around November 2013 unless otherwise clinically indicated.  Problem # 5:  FLATULENCE-GAS-BLOATING (ICD-787.3) new complains of increased intestinal gas manifested by early morning flatus. No other features are worrisome aspects.  Plan: #1. Discussion on the pathophysiology of intestinal gas  #2. Provide a brochure on intestinal gas #3. Gas and flatulence dietary sheet #4. Prescribed align one p.o. q. day x2 weeks. Samples given. #5. Plan routine GI followup in one year  Patient Instructions: 1)  Samples of Align take 1 by mouth once daily x 2 weeks 2)  Gas Brochure given with prevention diet. 3)  Please schedule a follow-up appointment in 1 year. 4)  The medication list was reviewed and  reconciled.  All changed / newly prescribed medications were explained.  A complete medication list was provided to the patient / caregiver. 5)  Copy: Dr. Dewitt Hoes

## 2010-04-06 NOTE — Procedures (Signed)
Summary: [facility]  [facility]   Imported By: Phillis Knack 03/13/2009 08:42:12  _____________________________________________________________________  External Attachment:    Type:   Image     Comment:   External Document

## 2010-04-06 NOTE — Procedures (Signed)
Summary: Upper Endoscopy/[facility]  Upper Endoscopy/[facility]   Imported By: Phillis Knack 03/13/2009 08:30:48  _____________________________________________________________________  External Attachment:    Type:   Image     Comment:   External Document

## 2010-04-06 NOTE — Procedures (Signed)
Summary: [facility]  [facility]   Imported By: Phillis Knack 03/13/2009 08:38:12  _____________________________________________________________________  External Attachment:    Type:   Image     Comment:   External Document

## 2010-04-06 NOTE — Procedures (Signed)
Summary: [facility]   Imported By: Phillis Knack 03/13/2009 08:57:42  _____________________________________________________________________  External Attachment:    Type:   Image     Comment:   External Document

## 2010-04-06 NOTE — Letter (Signed)
Summary: EGD Instructions  [facility]  [place], [place] [address]   Phone: [phone]  Fax: [phone]       [name]    Apr 20, 1945    MRN: [id number]       Procedure Day /Date:FRIDAY, 04/09/10     Arrival Time: 7:30 AM     Procedure Time:8:00 AM     Location of Procedure:                    [facility] (4th Floor) REPARATION FOR ENDOSCOPY   On FRIDAY  04/09/10 THE DAY OF THE PROCEDURE:  1.   No solid foods, milk or milk products are allowed after midnight the night before your procedure.  2.   Do not drink anything colored red or purple.  Avoid juices with pulp.  No orange juice.  3.  You may drink clear liquids until 6:00 AM, which is 2 hours before your procedure.                                                                                                CLEAR LIQUIDS INCLUDE: Water Jello Ice Popsicles Tea (sugar ok, no milk/cream) Powdered fruit flavored drinks Coffee (sugar ok, no milk/cream) Gatorade Juice: apple, white grape, white cranberry  Lemonade Clear bullion, consomm, broth Carbonated beverages (any kind) Strained chicken noodle soup Hard Candy   MEDICATION INSTRUCTIONS  Unless otherwise instructed, you should take regular prescription medications with a small sip of water as early as possible the morning of your procedure.  STAY ON PLAVIX PER DR. PERRY.          OTHER INSTRUCTIONS  You will need a responsible adult at least 67 years of age to accompany you and drive you home.   This person must remain in the waiting room during your procedure.  Wear loose fitting clothing that is easily removed.  Leave jewelry and other valuables at home.  However, you may wish to bring a book to read or an iPod/MP3 player to listen to music as you wait for your procedure to start.  Remove all body piercing jewelry and leave at home.  Total time from sign-in until discharge is approximately 2-3 hours.  You  should go home directly after your procedure and rest.  You can resume normal activities the day after your procedure.  The day of your procedure you should not:   Drive   Make legal decisions   Operate machinery   Drink alcohol   Return to work  You will receive specific instructions about eating, activities and medications before you leave.    The above instructions have been reviewed and explained to me by   _______________________    I fully understand and can verbalize these instructions _____________________________ Date _________

## 2010-04-06 NOTE — Letter (Signed)
Summary: [facility] Instructions  [facility]  [place], [place] [address]   Phone: [phone]  Fax: [phone]       [name]    1945-10-25    MRN: [id number]        Procedure Day /Date:THURSDAY, 02/04/10     Arrival Time:10:30 AM     Procedure Time:11:30 AM      Location of Procedure:                    X  [facility] (4th Floor)          PREPARATION FOR COLONOSCOPY WITH MOVIPREP/ENDO   Starting 5 days prior to your procedure 11/*26/11do not eat nuts, seeds, popcorn, corn, beans, peas,  salads, or any raw vegetables.  Do not take any fiber supplements (e.g. Metamucil, Citrucel, and Benefiber).  THE DAY BEFORE YOUR PROCEDURE         DATE:02/03/10 DAY: WEDNESDAY  1.  Drink clear liquids the entire day-NO SOLID FOOD  2.  Do not drink anything colored red or purple.  Avoid juices with pulp.  No orange juice.  3.  Drink at least 64 oz. (8 glasses) of fluid/clear liquids during the day to prevent dehydration and help the prep work efficiently.  CLEAR LIQUIDS INCLUDE: Water Jello Ice Popsicles Tea (sugar ok, no milk/cream) Powdered fruit flavored drinks Coffee (sugar ok, no milk/cream) Gatorade Juice: apple, white grape, white cranberry  Lemonade Clear bullion, consomm, broth Carbonated beverages (any kind) Strained chicken noodle soup Hard Candy                             4.  In the morning, mix first dose of MoviPrep solution:    Empty 1 Pouch A and 1 Pouch B into the disposable container    Add lukewarm drinking water to the top line of the container. Mix to dissolve    Refrigerate (mixed solution should be used within 24 hrs)  5.  Begin drinking the prep at 5:00 p.m. The MoviPrep container is divided by 4 marks.   Every 15 minutes drink the solution down to the next mark (approximately 8 oz) until the full liter is complete.   6.  Follow completed prep with 16 oz of clear liquid of your choice (Nothing red or  purple).  Continue to drink clear liquids until bedtime.  7.  Before going to bed, mix second dose of MoviPrep solution:    Empty 1 Pouch A and 1 Pouch B into the disposable container    Add lukewarm drinking water to the top line of the container. Mix to dissolve    Refrigerate  THE DAY OF YOUR PROCEDURE      DATE:02/04/10 DAY: THURSDAY  Beginning at 6:30 a.m. (5 hours before procedure):         1. Every 15 minutes, drink the solution down to the next mark (approx 8 oz) until the full liter is complete.  2. Follow completed prep with 16 oz. of clear liquid of your choice.    3. You may drink clear liquids until 9:30 AM (2 HOURS BEFORE PROCEDURE).   MEDICATION INSTRUCTIONS  Unless otherwise instructed, you should take regular prescription medications with a small sip of water   as early as possible the morning of your procedure.   We will contact you regarding your Plavix.  A letter will be sent to Dr. Rollene Fare for  approval.       OTHER INSTRUCTIONS  You will need a responsible adult at least 67 years of age to accompany you and drive you home.   This person must remain in the waiting room during your procedure.  Wear loose fitting clothing that is easily removed.  Leave jewelry and other valuables at home.  However, you may wish to bring a book to read or  an iPod/MP3 player to listen to music as you wait for your procedure to start.  Remove all body piercing jewelry and leave at home.  Total time from sign-in until discharge is approximately 2-3 hours.  You should go home directly after your procedure and rest.  You can resume normal activities the  day after your procedure.  The day of your procedure you should not:   Drive   Make legal decisions   Operate machinery   Drink alcohol   Return to work  You will receive specific instructions about eating, activities and medications before you leave.    The above instructions have been reviewed and  explained to me by   _______________________    I fully understand and can verbalize these instructions _____________________________ Date _________

## 2010-04-06 NOTE — Procedures (Signed)
Summary: Colonoscopy  Patient: [name] Note: All result statuses are Final unless otherwise noted.  Tests: (1) Colonoscopy (COL)   COL Colonoscopy           [facility].     [place], [place]  [address]           COLONOSCOPY PROCEDURE REPORT           PATIENT:  [name], [name]  MR#:  [id number]     BIRTHDATE:  14-Jul-1945, 41 yrs. old  GENDER:  male     ENDOSCOPIST:  Docia Chuck. Geri Seminole, MD     REF. BY:  Office     PROCEDURE DATE:  02/04/2010     PROCEDURE:  Colonoscopy with snare polypectomy x 2     ASA CLASS:  Class III     INDICATIONS:  Abdominal pain, constipation, Routine risk screening     ; negative colonoscopy 2003     MEDICATIONS:   Fentanyl 50 mcg IV, Versed 7 mg IV           DESCRIPTION OF PROCEDURE:   After the risks benefits and     alternatives of the procedure were thoroughly explained, informed     consent was obtained.  Digital rectal exam was performed and     revealed no abnormalities.   The LB 180AL [id number] endoscope was     introduced through the anus and advanced to the cecum, which was     identified by both the appendix and ileocecal valve, without     limitations.Time to cecum = 2:38 min.  The quality of the prep was     good, using MoviPrep.  The instrument was then slowly withdrawn     (time = 16:45 min) as the colon was fully examined.     <<PROCEDUREIMAGES>>           FINDINGS:  Two 67mm polyps were found in the ascending colon.     Polyps were snared without cautery. Retrieval was successful.     Normal colonoscopy without polyps, masses, vascular ectasias, or     inflammatory changes.   Retroflexed views in the rectum revealed     no abnormalities.    The scope was then withdrawn from the patient     and the procedure completed.           COMPLICATIONS:  None           ENDOSCOPIC IMPRESSION:     1) Two polyps in the ascending colon - removed     2) Normal colonoscopy otherwise           RECOMMENDATIONS:     1)  Repeat colonoscopy in 5 years if polyp adenomatous; otherwise     10 years     2) EGD today           ______________________________     Docia Chuck. Geri Seminole, MD           CC:  The Patient; Zonia Kief           n.     eSIGNED:   Docia Chuck. Geri Seminole at 02/04/2010 01:01 PM           [place], [id number]  Note: An exclamation mark (!) indicates a result that was not dispersed into the flowsheet. Document Creation Date: 02/04/2010 1:01 PM _______________________________________________________________________  (1) Order result status: Final Collection or  observation date-time: 02/04/2010 12:53 Requested date-time:  Receipt date-time:  Reported date-time:  Referring Physician:   Ordering Physician: Lavena Bullion [id number]) Specimen Source:  Source: Tawanna Cooler Order Number: [id number] Lab site:   Appended Document: Colonoscopy recall 5 yrs     Procedures Next Due Date:    Colonoscopy: 02/2015

## 2010-04-06 NOTE — Procedures (Signed)
Summary: Upper Endoscopy  Patient: [name] Note: All result statuses are Final unless otherwise noted.  Tests: (1) Upper Endoscopy (EGD)   EGD Upper Endoscopy       [facility].     [place], [place]  [address]           ENDOSCOPY PROCEDURE REPORT           PATIENT:  [name], [name]  MR#:  [id number]     BIRTHDATE:  1946-02-26, 62 yrs. old  GENDER:  male           ENDOSCOPIST:  Docia Chuck. Geri Seminole, MD     Referred by:  Office           PROCEDURE DATE:  02/04/2010     PROCEDURE:  EGD with biopsy, [id number]     ASA CLASS:  Class III     INDICATIONS:  abdominal pain           MEDICATIONS:   There was residual sedation effect present from     prior procedure., Fentanyl 25 mcg IV, Versed 3 mg IV     TOPICAL ANESTHETIC:  Exactacain Spray           DESCRIPTION OF PROCEDURE:   After the risks benefits and     alternatives of the procedure were thoroughly explained, informed     consent was obtained.  The LB GIF-H180 [id number] endoscope was     introduced through the mouth and advanced to the second portion of     the duodenum, without limitations.  The instrument was slowly     withdrawn as the mucosa was fully examined.     <<PROCEDUREIMAGES>>           A benign large caliber stricture was found in the distal     esophagus.  A 59mm shallow but friable ulcer was found pyloric     channel. The stomach was otherwise normal.CLO Bx taken.  The     duodenal bulb was normal in appearance, as was the postbulbar     duodenum.    Retroflexed views revealed a small hiatal hernia.     The scope was then withdrawn from the patient and the procedure     completed.           COMPLICATIONS:  None           ENDOSCOPIC IMPRESSION:     1) Benign Stricture in the distal esophagus     2) Small Ulcer in the pyloric channel     3) Normal duodenum           RECOMMENDATIONS:     1) Rx CLO if positive     2) Continue PPI (NEXIUM TWICE DAILY0     3) AVOID NSAIDS (OTHER THN  ASA)     4) RESUME PLAVIX TODAY     5) RPEAT EGD IN 8 WEEKS TO ASSESS ULCER HEALING (STAY ON PLAVIX     FOR THAT EXAM)           ______________________________     Docia Chuck. Geri Seminole, MD           CC:  The Patient; Zonia Kief           n.     eSIGNED:   Docia Chuck. Geri Seminole at 02/04/2010 01:20 PM           [place], [place],  [id number]  Note: An exclamation mark (!) indicates a result that was not dispersed into the flowsheet. Document Creation Date: 02/04/2010 1:20 PM _______________________________________________________________________  (1) Order result status: Final Collection or observation date-time: 02/04/2010 13:08 Requested date-time:  Receipt date-time:  Reported date-time:  Referring Physician:   Ordering Physician: Lavena Bullion [phone]) Specimen Source:  Source: Tawanna Cooler Order Number: [id number] Lab site:

## 2010-04-06 NOTE — Letter (Signed)
Summary: Pre Visit Letter Revised  [facility]  [place], [place] [address]   Phone: [phone]  Fax: [phone]        02/04/2010 MRN: [id number] [facility] [address] [place], [place]  [address]             Procedure Date:  04/09/10  Welcome to the Gastroenterology Division at [facility].    You are scheduled to see a nurse for your pre-procedure visit on 04/02/09 at 8:00 AM on the 3rd floor at [facility], [place] [employer].  We ask that you try to arrive at our office 15 minutes prior to your appointment time to allow for check-in.  Please take a minute to review the attached form.  If you answer "Yes" to one or more of the questions on the first page, we ask that you call the person listed at your earliest opportunity.  If you answer "No" to all of the questions, please complete the rest of the form and bring it to your appointment.    Your nurse visit will consist of discussing your medical and surgical history, your immediate family medical history, and your medications.   If you are unable to list all of your medications on the form, please bring the medication bottles to your appointment and we will list them.  We will need to be aware of both prescribed and over the counter drugs.  We will need to know exact dosage information as well.    Please be prepared to read and sign documents such as consent forms, a financial agreement, and acknowledgement forms.  If necessary, and with your consent, a friend or relative is welcome to sit-in on the nurse visit with you.  Please bring your insurance card so that we may make a copy of it.  If your insurance requires a referral to see a specialist, please bring your referral form from your primary care physician.  No co-pay is required for this nurse visit.     If you cannot keep your appointment, please call [phone] to cancel or reschedule prior to your appointment date.  This allows [place] the  opportunity to schedule an appointment for another patient in need of care.    Thank you for choosing [facility] for your medical needs.  We appreciate the opportunity to care for you.  Please visit [place] at our website  to learn more about our practice.  Sincerely, The Gastroenterology Division

## 2010-04-06 NOTE — Procedures (Signed)
Summary: [facility]  [facility]   Imported By: Phillis Knack 03/13/2009 08:36:37  _____________________________________________________________________  External Attachment:    Type:   Image     Comment:   External Document

## 2010-04-06 NOTE — Progress Notes (Signed)
Summary: [place]  [address]   Imported By: Phillis Knack 03/13/2009 08:49:05  _____________________________________________________________________  External Attachment:    Type:   Image     Comment:   External Document

## 2010-04-06 NOTE — Medication Information (Signed)
Summary: Prior [employer] Approved for Nexium/[employer]  Prior [employer] Approved for Nexium/[employer]   Imported By: Phillis Knack 08/05/2009 14:24:51  _____________________________________________________________________  External Attachment:    Type:   Image     Comment:   External Document

## 2010-04-06 NOTE — Progress Notes (Signed)
Summary: ABD PAIN  Phone Note Call from Patient Call back at Home Phone [phone]   Call For: Dr Henrene Pastor Summary of Call: Extreme abd pain- would like to be seen sooner than next available on 09-09-09 Initial call taken by: Irwin Brakeman [facility],  August 06, 2009 10:59 AM  Follow-up for Phone Call        9 day hx of constipation and stomach burning. He has been taking Nexium two times a day and using OTC antacids with no relief.  Constipation is causing him pain and requiring laxatives.  He will come in and see Dr Henrene Pastor today at 3:30 Follow-up by: Barb Merino RN, CGRN,  August 06, 2009 11:08 AM

## 2010-04-06 NOTE — Procedures (Signed)
Summary: [facility]  [facility]   Imported By: Phillis Knack 03/13/2009 08:41:03  _____________________________________________________________________  External Attachment:    Type:   Image     Comment:   External Document

## 2010-04-06 NOTE — Letter (Signed)
Summary: Anticoag  Anticoag   Imported By: Bubba Hales 02/02/2010 11:27:57  _____________________________________________________________________  External Attachment:    Type:   Image     Comment:   External Document

## 2010-04-06 NOTE — Miscellaneous (Signed)
Summary: Orders Update  Clinical Lists Changes  Orders: Added new Test order of TLB-H Pylori Screen Gastric Biopsy (83013-CLOTEST) - Signed 

## 2010-04-06 NOTE — Letter (Signed)
Summary: Anticoagulation Modification Letter  [facility]  [place], [place] [address]   Phone: [phone]  Fax: [phone]    January 25, 2010  Re:    [name] DOB:    Nov 27, 1945 MRN:    [id number]    Dear Rollene Fare:  We have scheduled the above patient for an Endo/Colon procedure. Our records show that  he is on anticoagulation therapy. Please advise as to how long the patient may come off their therapy of Plavix prior to the scheduled procedure(s) on 02/04/10.   Please fax back/or route the completed form to Randye Lobo, [place] at [phone].  Thank you for your help with this matter.  Sincerely,    Docia Chuck. Henrene Pastor, MD   Physician Recommendation:  Hold Plavix 7 days prior ________________  Stay on Aspirin

## 2010-04-06 NOTE — Progress Notes (Signed)
Summary: Triage / acute abdominal pain  Phone Note Call from Patient Call back at Home Phone [phone]   Caller: Patient Call For: Dr. Henrene Pastor Reason for Call: Talk to Nurse Summary of Call: wants to be worked in for abd pain Initial call taken by: Lucien Mons,  January 19, 2010 11:37 AM  Follow-up for Phone Call        Onset of new type of pain 2-3 inches above umbilicus since Sunday.Pain level is a  7 but can get some relief with positioing as pain increases with movement.Has had prior burning in that area in the past.Is on Nexium b.i.d.Occ. mild dysphagia with liquids or solids  but no increase since last r.o.v.Has not used Carafate all summer. Follow-up by: Abel Presto RN,  January 19, 2010 12:35 PM  Additional Follow-up for Phone Call Additional follow up Details #1::        with worsening pain to that level, probably best if he is seen at the emergency room where they can do more advanced testing. Also, he has a number of other significant medical problems including cardiac disease. Additional Follow-up by: Irene Shipper MD,  January 19, 2010 1:34 PM    Additional Follow-up for Phone Call Additional follow up Details #2::    Message left for pt. to call back.  Abel Presto RN  January 19, 2010 2:25 PM Message left again for pt. to call back.Marland KitchenMarland KitchenAbel Presto RN  January 19, 2010 2:42 PM Pt.ret'd. my call and he will have his wife bring him to [facility]. for evaluation as she gets home at 5:20 p.m. and the pain level currently is mild.Will go to [facility] if pain becomes intense. Follow-up by: Abel Presto RN,  January 19, 2010 4:28 PM  Additional Follow-up for Phone Call Additional follow up Details #3:: Details for Additional Follow-up Action Taken: ok good. followup with him after his evaluation so we stay in the loop if we're needed. Additional Follow-up by: Irene Shipper MD,  January 19, 2010 7:04 PM  Pt. returned call and went to [facility] ER last night was  treated. No cardiac issues. Would like to discuss. Webb Laws  January 20, 2010 3:10 PM    Patient went to [facility] ER they rlued out Cardiac causes of pain.  Advised him they thought he most likely had an abdominal wall muscle strain.  Dr Henrene Pastor he is c/o continued pain.  Mostly at the "base of my esophagus".  Dr Henrene Pastor please advise. Barb Merino RN, [facility]  January 20, 2010 3:41 PM   Appended Document: Triage / acute abdominal pain Have him see me next week  Appended Document: Triage / acute abdominal pain patient is scheduled for REV 11/21/114:00

## 2010-04-06 NOTE — Progress Notes (Signed)
Summary: [place]  [place]   Imported By: Phillis Knack 03/13/2009 08:52:54  _____________________________________________________________________  External Attachment:    Type:   Image     Comment:   External Document

## 2010-04-08 ENCOUNTER — Encounter: Payer: Self-pay | Admitting: Internal Medicine

## 2010-04-08 NOTE — Progress Notes (Signed)
Summary: Need for OV?  Phone Note Call from Patient   Summary of Call: Pt is scheduled for EGD for 2/3 to recheck pyloric ulcer.  His wife is recovering from surgery and will not be able to be w/ pt and drive him home.  He has no one else that can come w/ him.  Pt wants to cancel EGD until wife will be able to drive (in about a month).  Pt says that he is feeling better and is symptom free at this time.   I have cancelled EGD for 2/3.  Do you need to see him in the office  or do you want him to call and reschedule EGD when wife is able to be w/ pt? Initial call taken by: Ulice Dash RN,  March 30, 2010 11:22 AM  Follow-up for Phone Call        HE JUST NEEDS TO CALL AND RESCHEDULE EGD Follow-up by: Irene Shipper MD,  March 30, 2010 11:30 AM  Additional Follow-up for Phone Call Additional follow up Details #1::        Will call pt. and let him know to reshcedule EGD when wife can come w/ him Additional Follow-up by: Ulice Dash RN,  March 30, 2010 11:33 AM

## 2010-04-08 NOTE — Miscellaneous (Signed)
Summary: [facility]  Clinical Lists Changes   Pt does not have ride for procedure.  Wants to reschedule when ride is available.  Phone note sent to Dr. Henrene Pastor.

## 2010-04-09 ENCOUNTER — Ambulatory Visit: Admit: 2010-04-09 | Payer: Self-pay | Admitting: Internal Medicine

## 2010-04-09 ENCOUNTER — Other Ambulatory Visit: Payer: Self-pay | Admitting: Internal Medicine

## 2010-05-04 NOTE — Letter (Signed)
Summary: [facility] By: Rise Patience 04/27/2010 14:03:05  _____________________________________________________________________  External Attachment:    Type:   Image     Comment:   External Document

## 2010-07-20 NOTE — Assessment & Plan Note (Signed)
[name]                         GASTROENTEROLOGY OFFICE NOTE   [name], [name]                         MRN:          [id number]  DATE:01/18/2007                            DOB:          1945/12/15    HISTORY:  This is a 65 year old gentleman with a history of  gastroesophageal reflux disease complicated by erosive esophagitis and  peptic stricture which has required prior esophageal dilation.  He also  has coronary artery disease with prior myocardial infarction and  coronary artery stenting.  He was last evaluated in this office on  December 29, 2005.  See that dictation for details.  He has continued on  Nexium 40 mg b.i.d.  On that dosage of medication, he does well; with  any lesser dosage of medication, he has significant recurrent reflux  symptoms.  Fortunately, he has had no recurrent dysphagia since his last  dilation in 2005.  He also inquires about colon cancer screening.  His  last colonoscopy in October 2003, was normal.  However, his preparation  was noted to be fair and followup in 5 years recommended.  He has no  active lower GI complaints such as pain, change in bowel habits, or  bleeding.  He does think he might have had a hemorrhoid and wishes for  his buttock to be evaluated.   CURRENT MEDICATIONS:  Nexium, atenolol, Plavix, aspirin, fiorinal ,  multivitamin, lovastatin, and Norvasc.   ALLERGIES:  SULFA.   PHYSICAL EXAMINATION:  GENERAL:  Finds a well-appearing male in no acute  distress.  He is alert and oriented .  VITAL SIGNS:  Blood pressure is 110/60, heart rate 64, weight is 162.6  pounds.  HEENT:  Sclerae are anicteric.  Conjunctivae are pink.  Oral mucosa  intact.  LUNGS:  Clear.  HEART:  Regular.  ABDOMEN:  Soft without tenderness, mass, or hernia.  RECTAL:  Reveals external hemorrhoidal skin, otherwise normal.   IMPRESSION:  1. Gastroesophageal reflux disease complicated by erosive esophagitis      and  peptic stricture, currently asymptomatic post dilation on      Nexium 40 mg twice a day.  2. Colon cancer screening.  The patient currently has no symptoms.      Limited examination 5 years ago is discussed.  He is an appropriate      candidate for a repeat colonoscopy at his convenience.  The nature      of the procedure as well as the risks, benefits, and alternatives      were reviewed.  He understood and agrees to proceed.  He should      perform the examination on aspirin and Plavix given his cardiac      history.  3. Incidental hemorrhoidal tissue.  4. Significant cardiac disease.   RECOMMENDATIONS:  1. Refill Nexium.  2. The patient will schedule colonoscopy at his convenience.  3. Return to the general medical care of Dr. Keenan [name].     [name]. [name] Pastor, MD  Electronically Signed    [name]  DD: 01/18/2007  DT: 01/19/2007  Job #: [id number]   cc:  [name], M.D.

## 2010-07-23 NOTE — Op Note (Signed)
NAMEBASIM, STEMM                            ACCOUNT NO.:  [id number]   MEDICAL RECORD NO.:  [id number]                   PATIENT TYPE:  INP   LOCATION:  2040                                 FACILITY:  [facility]   PHYSICIAN:  Richard A. Rollene Fare, M.D.          DATE OF BIRTH:  1945/04/21   DATE OF PROCEDURE:  08/05/2003  DATE OF DISCHARGE:                                 OPERATIVE REPORT   PROCEDURE:  Retrograde abdominal aortic catheterization, selective right  renal angiogram by Judkins technique with limited dye, cutting balloon  atherectomy, high-grade right renal stent, in-stent restenosis (ISR),  subsequent upgraded 5.0-mm balloon dilatation.   OPERATOR:  Richard A. Rollene Fare, M.D.   DESCRIPTION OF PROCEDURE:  Please refer to complete PCI report of 08/05/2003  for details.   This patient had had renovascular hypertension and prior right renal artery  stenting 06/11/1998.  He has diffuse cardiovascular with peripheral and  cardiac disease.  He underwent staged PCI for medically refractory angina  and had culprit lesion, protected main left, GES, coronary stenting and  proximal-mid LAD PTCA earlier today.  He has known high-grade in-stent  restenosis of the right renal artery from his recent angiogram of  07/31/2003.  Because of significant hypertension with blood pressures in the  160-170 range, on maximal medical therapy and with recent successful  coronary intervention, in the setting of this patient who has difficult  access because of peripheral vascular disease, it was elected to proceed  with PTA.  He was on Aggrastat bolus plus infusion,  ACT was therapeutic, he  had received weight-adjusted heparin and is on chronic aspirin and Plavix.   Informed consent was obtained to proceed.   The right renal artery was intubated with a 6-French JR-4, short Cordis  guiding catheter through the previously placed left femoral artery 6-French,  short side-arm sheath.  The high-grade  in-stent restenosis of the right  renal artery was crossed with a stabilizer 0.014-inch guide wire.  The  lesion was then crossed with a 4 .0 x 12 coronary cutting balloon and the  high-grade, 90-95%, in-stent ostial and proximal restenosis was dilated at  10-31 and 8-23.  It was then, using exchange technique, dilated with a 5-mm  x 15-mm Cordis Aviator balloon at 11-30 and 12-20.  There was some elastic  recoil, so the lesion was re-crossed with the cutting balloon and the ostial  portion was dilated at 12-20.  Using exchange technique, the ISR was dilated  with the 5.0 x 15 Aviator balloon at 14-40.  The balloon was pulled back.  There was complete elimination of the transstenotic gradient from 50 mmHg to  0 with the guiding catheter across the lesion.  A final angiogram showed  stenosis reduction of 90-95% to less than 50% with no dissection, smooth,  with good flow.  The procedure was done with an additional 25-30 cc of dye  following the patient's  recent coronary intervention, as outlined.   CATHETERIZATION DIAGNOSES:  1. Prior right renal artery stenting 06/11/1998, for renovascular     hypertension.  2. Recurrent hypertension, high-grade right renal artery ostial and proximal     in-stent restenosis.  3. Successful cutting balloon atherectomy and percutaneous transluminal     angioplasty, right renal artery in-stent     restenosis.  4. Please see percutaneous coronary intervention report of 08/05/2003 and     catheterization report of 07/31/2003 for further diagnoses.                                               Richard A. Rollene Fare, M.D.    RAW/MEDQ  D:  08/05/2003  T:  08/05/2003  Job:  [id number]   cc:   Dewitt Hoes  [address].  [place]  [place] [address]  Fax: [phone]   Quay Burow, M.D.  FaxOZ:[phone]   Peripheral Vascular Lab

## 2010-07-23 NOTE — Assessment & Plan Note (Signed)
[facility]                           GASTROENTEROLOGY OFFICE NOTE   [name], [name]                         MRN:          [id number]  DATE:12/29/2005                            DOB:          [birth year]    HISTORY:  This is a 65 year old white male with a history of  gastroesophageal reflux disease, complicated by erosive esophagitis and  peptic stricture for which he has undergone prior esophageal dilation.  He  is on twice daily Nexium which is needed to control symptoms.  He also has a  history of coronary artery disease with prior myocardial infarction for  which he has undergone prior coronary artery stenting.  He was last  evaluated November 10, 2004 for abdominal bloating, discomfort, reflux  disease and dysphagia.  He presents now for follow up.  On Nexium twice  daily he is having no indigestion or heartburn.  No recurrent solid food  dysphagia.  He does describe a sensation of difficulty of swallowing in the  pharyngeal region with cold liquids on the first swallow only.   CHIEF COMPLAINT:  Is that of increasing intestinal gas as manifested by  rumbling in the abdomen and flatus.  His last upper endoscopy was performed  in April 2005.  His last colonoscopy, which was unremarkable, in October  2003.  Patient denies nausea, vomiting or bleeding.  His bowel habits are  unchanged. His weight is stable.  He also requests refill of Nexium.   CURRENT MEDICATIONS:  1. Multivitamin daily.  2. Aspirin 81 mg daily.  3. Nexium 40 mg b.i.d.  4. Vitamin E.  5. Atenolol 37.5 mg daily.  6. Plavix 75 mg daily.  7. Lovastatin 40 mg daily.  8. Norvasc 2.5 mg b.i.d.  9. Nitroglycerin pump spray as needed.   PHYSICAL EXAMINATION:  Finds a well-appearing male in no acute distress.  Blood pressure 120/66.  Heart rate 64.  Weight 154.4 pounds.  HEENT:  Sclerae anicteric.  Conjunctivae pink.  Oral mucosa intact.  No  adenopathy.  LUNGS:  Are clear.  HEART:  Is regular.  ABDOMEN:  Soft without tenderness, mass or hernia. Good bowel sounds heard.   IMPRESSION:  1. Gastroesophageal reflux disease, complicated by erosive esophagitis and      peptic stricture.  Stable on Nexium.  2. Probable esophageal spasm with cold liquids.  3. Increased intestinal gas.  Nonspecific and chronic.   RECOMMENDATIONS:  1. Refill Nexium 40 mg b.i.d.  2. Reflux precautions.  3. Discussion today regarding increased intestinal gas.  As well,      extensive literature provided on the topic.  4. Trial of Flora-Q 1 p.o. b.i.d. for 1 month.  5. Office follow up in 1 year unless interval questions or problems.    ______________________________  Docia Chuck. Geri Seminole., MD    JNP/MedQ  DD: 12/29/2005  DT: 12/30/2005  Job #: [id number]   cc:   Dewitt Hoes  Richard A. Rollene Fare, M.D.

## 2010-07-23 NOTE — Cardiovascular Report (Signed)
NAMEKONSTANTIN, HEIGES                            ACCOUNT NO.:  [id number]   MEDICAL RECORD NO.:  [id number]                   PATIENT TYPE:  INP   LOCATION:  6526                                 FACILITY:  [facility]   PHYSICIAN:  Richard A. Rollene Fare, M.D.          DATE OF BIRTH:  10-13-1945   DATE OF PROCEDURE:  08/05/2003  DATE OF DISCHARGE:                              CARDIAC CATHETERIZATION   PROCEDURES:  Retrograde central aortic catheterization, left coronary  angiography, pre- and post IC nitroglycerin administration, cutting balloon  atherectomy, high-grade culprit lesion protected main left coronary  stenosis, PTCA of high-grade mid left anterior descending stenosis, DES  stent, 2.5 x 13 DES CYPHER stent, post-dilatation 2.75 balloon and 3.0  balloon at ostia, Aggrastat bolus plus infusion, continued aspirin and  Plavix therapy, weight-adjusted heparin 4800 units.   BRIEF HISTORY:  Please refer to detailed cath report of 07/31/2003, for this  patient's history.  He has a long history of severe coronary artery disease  and peripheral vascular disease.  He has known total left subclavian  occlusion and moderately severe, mildly symptomatic right subclavian  stenosis.  Remote CABG in 1984 with all vein grafts occluded and re-do  CABG/[facility] 04/05/1994, PAD with bilateral SFA occlusion and right external  iliac stenosis, right renal artery PTA and stent for renovascular  hypertension 06/11/1998 with in-stent restenosis, status post RCA in 1996,  esophageal stricture with recent dilatation, hyperlipidemia intolerant to  all statins, continued cigarette abuse.   The patient underwent repeat catheterization for chronic, severe, medically  refractory angina with two recent hospitalizations on 07/31/2003.  He had a  patent free LIMA to the large DX-3 with some retrograde filling of the LAD  but compromised by an 85% LAD stenosis after a large septal perforator, at  high grade, greater than  95% (daylight) main left diffuse coronary stenosis  and proximal 85% LAD stenosis before DX-1 and DX-2.  DX-1 circumflex was  occluded just after PAVG branch.  The right was occluded in the midportion  and there were collaterals to the PDA through the DX-3.  The vein graft to  the OM was patent with 50% proximal segmental smooth narrowing and 40-50%  proximal-mid narrowing.  EF 45-42%.   At that time, we attempted to continue medical therapy, reserving any main  left intervention for recurrent angina.  We were going to consider EECP,  although he would be a poor candidate because of bilateral SFA occlusion and  Fontaine IIb claudication and peripheral vascular disease.   He was re-admitted to [facility] with unstable angina on 08/03/2003 without  myocardial infarction.   We were still going to continue medical therapy; however, he had recurrent  chest pain in the hospital.  The cineangiograms were reviewed with Christy Sartorius, M.D.  We felt that this patient was a candidate for culprit lesion  protected main left intervention, and possibly LAD, because of jeopardized  flow to the DX-1 and DX-2 as a possible cause for his medically refractory  angina.  Informed consent was obtained from the patient to proceed with  this.   The patient had Lovenox approximately five hours prior to the procedure.  He  was brought to the second floor CP lab in a post-absorptive state.  Five  milligrams of Valium p.o. was given as premedication, and he was given  intermittent Nubain and Versed for sedation in the lab.  Creatinine was 1.0.  He was hydrated preoperatively.   The main left was out of plane and it required a CLS 3.0, 6-French guiding  catheter for co-axial intubation.  Left coronary angiography was done pre  and post intracoronary nitroglycerin administration.  This revealed 95-99%  segmental, tandem-daylight, left main coronary stenosis ending before DX-1.  There was, however, disease between DX-1  and DX-2 and 90-95% stenosis of the  LAD beyond the large septal perforator and free LIMA graft at DX-3.  Initially we crossed with an [employer]ahi light 0.014-inch guide wire.  However, in  trying to manipulate this into the LAD, it pulled back.  We then were not  able to re-cross with this wire.  The wire was changed to a Choice PT 0.014-  inch guide wire and we were able to cross through the high-grade left main  stenosis and maintain guiding catheter position.  A 1.5 x 12-mm Voyager  balloon was used to dilate the main left at 12-29 at 14-17.  The balloon was  then upgraded to a 2.0 x 12 Voyager, and the main left was dilated at 14-47  and 15-27.  Cutting balloon atherectomy was then done of the left main with  a 2.5 x 10 cutting balloon at 6-50, 9-40 and 5-30.  We were then able to  manipulate the wire down across the LAD lesion into the distal LAD.  The LAD  lesion was dilated with a 2.5 x 12 Voyager (7-36).  This was then upgraded  to a 2.75 x 12 Voyager with inflations done from 7-9 atmospheres for 30-50  seconds.  Initial attempts at crossing the main left and into the proximal  LAD with a 2.5 CYPHER stent was unsuccessful.  The Voyager balloon was then  used to dilate between the DX-1 and DX-2 at 5-6 atmospheres with good  balloon inflation and the LAD just beyond the DX-3.  Despite this, we were  not able to pass either a cutting balloon or a CYPHER or a 2.5 chromium  cobalt Vision ACS stent to the LAD lesion.  The LAD lesion was then  redilated with a 2.75 balloon at 8-25.  This was felt to be an adequate PTCA  result of this area.   We then stented the left main with a 2.5 x 13 CYPHER which ended before the  DX-1 and covered the ostia protruding approximately 1-2 mm to the root.  It  was deployed at 13-33, post-dilated 18-49.  The balloon was then upgraded using the exchange technique to a 2.75 x 12 SciMed Quantum balloon and the  stent was dilated at 14-41 and 14-30.  The ostia was  then dilated using  exchange technique with the 3.0 x 8 SciMed Quantum balloon at 12-34.   Final injections were performed, which demonstrated good position of the  left main stent ending before DX-1.  There was some haziness near DX-1 and  DX-2 but good residual lumen.  The main left stenosis was reduced from 95-  99% to less than 10%.  The ostium appeared to be covered, and there was no  dissection and good flow to the diagonals.  The LAD lesion beyond the DX-3,  treated with PTCA alone, was reduced from 90-95% to approximately 40.  There  was some haziness but good flow into the LAD, which was smaller than the  bifurcating graft at DX-3.   The patient tolerated this procedure well.  He was pain-free, no EKG  changes, and making good urine.  Omnipaque dye was used throughout the  procedure.  The procedure was done through the [facility] which was entered using  modified Seldinger technique with an 18 thin-wall needle and done through a  6-French short Daig side-arm sheath via the left groin approach.  ACT's were  monitored.  The patient was given a total of 4500 units of heparin,  Aggrastat bolus plus infusion, and he was continued on aspirin and Plavix  which he had been on chronically.  His final ACT was 264 seconds.   CATHETERIZATION DIAGNOSES:  1. Successful cutting balloon atherectomy and subsequent drug-eluting     stenting with upgraded balloon dilatation of the main left coronary     artery and proximal left anterior descending artery for medically     refractory angina with multiple recent hospital admissions.  2. Successful percutaneous transluminal coronary angioplasty (POBA),     proximal-mid left anterior descending artery beyond diagonal-3 and large     septal perforator branch.  3. See above history and recent catheterization report 07/31/2003, for     further diagnoses.                                               Richard A. Rollene Fare, M.D.    RAW/MEDQ  D:   08/05/2003  T:  08/05/2003  Job:  [id number]   cc:   Quay Burow, M.D.  Fax: [place]. Rollene Fare, M.D.  [phone], [place] [address]  Fax: [phone]   CP Lab   Dewitt Hoes  [address]  [place] [address]  Fax: [phone]   [facility], [name] office

## 2010-07-23 NOTE — Discharge Summary (Signed)
NAMEDEMETRIAS, [name]                            ACCOUNT NO.:  [id number]   MEDICAL RECORD NO.:  [id number]                   PATIENT TYPE:  INP   LOCATION:  6526                                 FACILITY:  [facility]   PHYSICIAN:  Richard A. Rollene Fare, M.D.          DATE OF BIRTH:  16-Feb-1946   DATE OF ADMISSION:  08/03/2003  DATE OF DISCHARGE:  08/06/2003                                 DISCHARGE SUMMARY   SUMMARY:  [name] is a 65 year old white male patient of Dr. Terance Ice who presented to [facility] with chest pain.  He was then  transferred to [facility].  He was seen by Dr. Glade Lloyd on  admission.  It was decided that he should undergo cardiac catheterization  and this was performed on Aug 05, 2003.  He was found to have high-grade  blockage to his left main, his free LIMA to his diagonal 3 was patent.  It  was decided that he should undergo PCI.  He also had a 95% LAD stenosis.  He  underwent PTCA and CYPHER stenting to his left main 2.5 x 13 and a PTCA to  his LAD reduced from 95 to 40%.  He also was found to have 90-95% right  renal artery stenosis, he underwent cutting balloon PCI reduced from 95 to  50%, his left renal artery had about 20% blockage.  He was seen by Dr.  Terance Ice on August 06, 2003.  Left and right groins were okay.  He was  considered stable to be discharged home.   LABORATORIES:  Hemoglobin was 12, hematocrit 34.8, WBC 5.7, platelets 168.  Sodium 141, potassium 3.7, BUN was 8, creatinine 1, glucose 92.  CK-MB's  were negative.  He did have elevated troponins at 0.73, 0.77, and 0.54.  These were from Aug 03, 2003 through Aug 05, 2003.  Postprocedure CK-MB was  negative.  No chest x-ray in the chart at the time of this dictation.   VITALS:  His blood pressure was 128/58, his pulse was 60, respirations 22,  O2 saturations were 94% at the time of discharge.   DISCHARGE MEDICATIONS:  1. Nitroglycerin patch 0.2 mg/hr on in the  morning off at bedtime.  2. Tenormin 37.5 one and one half tabs a day.  3. Zetia 10 mg once per day.  4. Mevacor 40 mg at bedtime.  5. Nexium 40 mg twice per day.  6. Plavix 75 mg once per day.  7. Enteric-coated aspirin 81 mg once per day.  8. Nitroglycerin p.r.n.   ACTIVITIES:  No strenuous activity/lifting x4 days.  No driving x2 days.   DIET:  He should be on a low saturated fat diet.   FOLLOW UP:  He will follow up with Cecilie Kicks on August 18, 2003 at 9  o'clock with Dr. Rollene Fare.   DISCHARGE DIAGNOSES:  1. Unstable angina/acute coronary syndrome.  2. Status  post cardiac catheterization with a CYPHER stent placed to his     left main 2.5 x 13 and a cutting balloon to his left anterior descending.  3. Peripheral vascular disease with a cutting balloon percutaneous coronary     intervention to his right renal artery stenosis this admission reduced     from 95 to less than 50.  4. History of coronary artery bypass grafting 1984 with a redo in 1996 by     Dr. Redmond Pulling.  5. Hyperlipidemia - intolerance to multiple statins, we are trying Mevacor,     discharged home on Mevacor.  6. Tobacco abuse.  Smoking cessation again recommended.  7. Chronic obstructive pulmonary disease.  8. Gastroesophageal reflux disease.  9. History of right carotid endarterectomy 1996.      Cyndia Bent, N.P.                        Richard A. Rollene Fare, M.D.    BB/MEDQ  D:  08/06/2003  T:  08/06/2003  Job:  [id number]   cc:   Dewitt Hoes  [address]  [place] [address]  Fax: [phone]

## 2010-07-23 NOTE — Cardiovascular Report (Signed)
[name], [name]                  ACCOUNT NO.:  [id number]   MEDICAL RECORD NO.:  [id number]          PATIENT TYPE:  INP   LOCATION:  2923                         FACILITY:  [facility]   PHYSICIAN:  Richard A. Rollene Fare, M.D.DATE OF BIRTH:  10-10-1945   DATE OF PROCEDURE:  12/16/2003  DATE OF DISCHARGE:                              CARDIAC CATHETERIZATION   PROCEDURES:  1.  Retrograde central aortic catheterization, selective vein graft      angiography, pre- and post IC nitroglycerin administration, selective      left coronary angiography pre and post IC nitroglycerin administration,      PTA, high grade mid LAD stenosis post diagonal III (POBA) unable to      cross with DES or acromion cobalt stent.  2.  Right coronary angiogram, PTCA and subsequent DES 2.5/18 TAXUS stent,      mid high grade stenosis native vessel.  3.  Continued Integrillin, aspirin and Plavix.  4.  Intermittent bolus heparin monitoring ACT, total 5900 units.   COMPLICATIONS:  None.   BRIEF HISTORY:  Please refer to complete PCI report of December 11, 2003, for  this patient's complex history of severe chronic cardiovascular disease.  Essentially he is a 65 year old disabled married father of two with severe  diffuse cardiovascular disease, hyperlipidemia, intolerance to statins in  the past, currently on lovastatin, continued cigarette abuse, CABG,  multivessel [place], [place], 1984, subsequent all grafts  occlusion.  Redo CABG Dr. Redmond Pulling 1996, [place]. [facility].  He  subsequently had remote protected left main GR stenting in June of 1997.  He  presented.  He has also had right coronary artery PS stenting in June of  1997.  He developed progression of his coronary disease as well as his  peripheral arterial disease including RCA in 1996, total occlusion of his  left subclavian artery,  high grade right subclavian stenosis, severe right  renal artery stenosis with severe systemic  hypertension, requiring prior  renal artery stenting in April of 2000 and redilatation for in-stent  restenosis May 2005.   He had unstable angina, rehospitalized May 2005 and had protected left main  sandwiched DES stenting with a patent free RAMA to the large twin diagonal.  He had PTCA of the mid LAD lesion because of difficulty axis and inability  to cross with a stent.  He had residual high grade mid right coronary  disease before the total occlusion supplying a large RV branch which  supplied collaterals to the left.   The patient had recurrent chest pain as an outpatient, was continued on  medical therapy and had ECP with initially some benefit and no significant  long-term benefit.  He tolerated this despite known bilateral SFA occlusion.   He was admitted with unstable angina on December 10, 2003, after transfer from  [facility] and he underwent catheterization and PCI on December 11, 2003, with a new 95% ulcerative and tandem 90% lesion of the SVG to the OM  which supplied retrograde filling of the distal circumflex.  He also had 60  to 70% ostial stenosis.  DES stenting of the mid portion of the graft with a  3.0 20 TAXUS stent and DES stenting of the ostial portion of the graft with  a 3.0 12 TAXUS stent.  This was successful.  We elected to continue medical  therapy, however, the patient had recurrent episodes of chest pain requiring  him to stay in the CCU and requiring reinitiation of 2b3a inhibitor with  Integrillin, heparin and nitroglycerin.  There were no acute EKG changes and  initial elevated CPK post procedure which was related to his graft occlusion  before starting the PCI, was about 428 with an MB of 52 and that and  troponins came back to normal.  It was elected to bring him back to the  catheterization lab for evaluation of his recent PCI and consider  intervention for his LAD and possible right coronary with continued chest  pain.   Informed consent was  obtained from the patient and his wife to proceed with  the procedure.  He was known to be a high risk for any procedure because of  his severe diffuse cardiovascular disease and clinical history and limited  options as outlined above.  He was felt to ot be a candidate for a second  redo CABG because of comorbid conditions, lack of conduits, and severe  peripheral vascular disease.  He has asymptomatic high grade left carotid  stenosis as well and has had a remote RCEA without restenosis in 1996.   The patient brought to the second floor CP lab in the post absorptive state.  The right groin had a residual small hematoma and was not used.  There was  no oozing.  The left groin prepped, draped in the usual manner.  Xylocaine  1% was used for local anesthesia and the LCFA was entered with single  anterior puncture using an 18 thin wall needle and the 6 French short side  arm sheath Daig was inserted.  Because of prior experiences with different  guiding and diagnostic catheters, a right 3.5 JR catheter was used for  selective vein graft angiography of the SVG to the OM.  The stents were  visualized fluoroscopically.  Arterial pressures were monitored throughout  the procedure as were EKGs and he remained in sinus rhythm with blood  pressures ranging 150 to 150 mmHg treated with intermittent IV and IC  nitroglycerin.   Saphenous vein graft to the OM was widely patent with 0% ostial and proximal  stenosis and 0% mid stenosis.  There was TIMI-3 flow throughout the graft to  the OM and retrograde to the circumflex.  This was felt to be excellent  short term result.  We felt that the culprit lesion was probably either his  LAD or the high grade RCA before the large RV branch.  There were  collaterals from the circumflex to the distal RCA as well.   The left coronary was then intubated with a FL3.5 6 [place] guiding catheter.  An Asahi 0.01 four inch guidewire was used to cross the high grade  LAD stenosis  and position this beyond the diagonal III into the distal LAD.  We  were able to initially cross with 2.0/15 Voyager balloon and dilatation of  the high grade 90 to 95% mid LAD stenosis beyond the diagonal III and across  the large septal perforator branch was done at 5-40.  The associated area  was dilated at 8/20 and 10/20.  The balloon was pulled back.  It was then  upgraded to a 2.25 Maverick balloon and inflations at 6 to 7 atmospheres for  25 seconds were done beyond the diagonal II and across the diagonal III side  branch and covering the LAD lesion.  After this, we then tried to cross this  lesion with a 2.5/16 TAXUS stent, however, it would not cross through the  previously placed main left coronary stents.  The patient had a prior  Gianturco-Rubin stent of the main left from 1997 for protected high grade  disease and then a sandwich DES stent of the proximal main left placed May  2005.   Because of rigidity, calcification, tortuosity across the lesion and through  the stents, the stent was not able to pass so it was pulled back.  The LAD  was then reinflated with a 2.25/15 balloon from 4 to 8 atmospheres and 25 to  40 seconds.  We tried to cross with a CYPHER DES stent but this was also  unsuccessful for similar reasons and we were not able to essentially get  through the proximal LAD stent to even address approaching the lesion  beyond.  This was pulled back and dilatations were then done with a 2.5  Voyager balloon to try to further try to gain access.  We then tried a  chromium cobalt Guidant mini-vision 2.5 stent and this was likewise unable  to cross.  Actually there was some accordion and crimping of the stent  probably related to previous placed stent on pulling it back but we were  able to remove the stent intact with no adverse effects.  We then redilated  the LAD lesion with a 2.5 balloon at 6 and 8 atmospheres for 63 and 57  seconds.  The balloon was  pulled back and final injection showed stenosis  reduction from 90-95% to approximately 50% with last degree coil and  localized dissection.  There was, however, TIMI-3 flow to the distal LAD.  There was good flow to the large septal perforator branch and there was  flashback from the graft that supplied the diagonal III.  The diagonal I and  II remained intact and there was good flow through the main left coronary  stent.  It should also be noted that we did use a buddy wire with a more  supportive Asahi 0.014 inch Prowater wire across the LAD lesion and despite  this, we were still unable to cross with the stents.   The patient was stable and the right coronary was intubated with a JR-4 side  hole 6 [place] guide.  The right coronary lesion which was high grade 90 to  95% segmental in the mid portion with direct continuation to the large RV  marginal branch which supplied collaterals to the distal right.  It was  crossed with the Asahi 0.14 inch Prowater wire.  The right was dilated with a 2.5/15 Voyager balloon at 6-30 and 11-26.  It was then stented with the  previously opened TAXUS Express II 2.5/16 DES stent which was deployed at 34-  59 and post dilated at 18-42.  Final injection showed excellent angiographic  result with stenosis reduction down to essentially less than 10%.  Good  stent deployment and tapering into the RV branch and excellent improved  flow.  There was approximately 40% narrowing of the proximal right after the  ostium and it was elected not to treat this since it was noncritical and  there was excellent flow.   Dilatation system  was then removed.  The final ACT after receiving  intermittent bolus heparin (see log) during the procedure was 290 seconds.  He received IC nitroglycerin 200 mcg x3 during the procedure and received a  total of 5900 units of heparin, 4 mg of Versed and 8 mg of Nubain for  sedation.  The patient had some back discomfort, musculoskeletal  and chest  pain with initial LAD inflations but otherwise had no chest pain or  significant problems and tolerated complex procedure well.   He now has successful DES stenting of his right coronary to protect a  jeopardized large RV branch which also supplies collaterals to the distal  right.  He has widely patent SVG to the OM with proximal and ostial and mid  stents open from PCI of December 11, 2003.   He has widely patent main left and proximal LAD (protected) with remote GR  stent in 1997 and sandwiched DES stent May 2005, with no restenosis.  He has  had a suboptimal PTCA (POBA) result of his mid LAD and is at risk of another  restenosis of this area.  He does however, have a patent RIMA to the twin  diagonal III which does supply some flow retrograde to the LAD.  I do not  believe there are any good options left for treating this mid LAD other than  medical therapy.  I think the interventional options have been exhausted  outside of possible culprit lesion PTCA if that became necessary.  Continued  medical therapy, hopefully will now discontinue smoking and we can further  adjust his statins.   Renal function will be watched carefully postoperatively and he will be  continued on Integrillin for 18 hours with heparin discontinued at present.   CATHETERIZATION DIAGNOSES:  1.  Recurrent angina, status post complex percutaneous coronary intervention      December 11, 2003, with ostial and mid DES  stenting of saphenous vein      graft to obtuse marginal.  2.  High grade mid left anterior descending restenotic lesion from prior      percutaneous transluminal coronary angioplasty May 2005, treated with      percutaneous transluminal coronary angioplasty and unsuccessful attempt      at stenting at passing a stent, suboptimal but adequate result with good      flow.  3.  Successful DES stenting high grade segmental mid native right coronary      artery stenosis into large right  ventricular branch.  4.  Free right internal mammary artery to large twin diagonal III. 5.  Patent diagonal I and diagonal II and large septal perforator.  6.  Left to right collaterals to distal right coronary artery intact from      grafted diagonal III and left anterior descending and grafted obtuse      marginal.  7.  Ejection fraction 40 to 45% segmental wall motion abnormalities.  8.  Right renal artery stenosis treated with stenting 2000, redilatation May      2005 and redilatation October 2005, for second in-stent restenosis and      significant hypertension with normal renal function.  9.  Gastroesophageal reflux disease, past dilatations.  10. Continued cigarette abuse.  11. Severe hyperlipidemia, intolerant to statins currently tolerating      [place].  12. Remote protected main left GR stent 1997 and right coronary artery stent      1997.  63. Coronary artery bypass grafting [address], [place].  All      grafts occluded with redo coronary artery bypass grafting in [facility], Dr. Redmond Pulling.  14. Stable claudication with bilateral superficial femoral artery occlusion.  15. Status post right coronary artery 1996, no restenosis on Dopplers, mild      to moderate left internal carotid artery asymptomatic stenosis.  79. Old total left internal carotid artery stenosis occlusion and moderately      severe right internal carotid artery occlusion treated medically.       RAW/MEDQ  D:  12/16/2003  T:  12/16/2003  Job:  [id number]   cc:   Bryson Dames, M.D.  [address], [address] [place] [address]  Fax: [place]  [address]  [place] [address]  Fax: [phone]

## 2010-07-23 NOTE — H&P (Signed)
NAME:  [name], [name]                            ACCOUNT NO.:  [id number]   MEDICAL RECORD NO.:  [id number]                   PATIENT TYPE:  INP   LOCATION:  3729                                 FACILITY:  [facility]   PHYSICIAN:  Quay Burow, M.D.                DATE OF BIRTH:  1945-08-07   DATE OF ADMISSION:  06/25/2003  DATE OF DISCHARGE:                                HISTORY & PHYSICAL   CHIEF COMPLAINT:  Chest pain.   HISTORY OF PRESENT ILLNESS:  Mr. Shed is a 65 year old male with a long  history of coronary disease and peripheral vascular disease.  He had bypass  surgery at [facility] in 1984.  He had re-do bypass surgery in January of  1996 by Dr. Merleen Nicely.  In 1997, he had a stent placed to his native  RCA.  His last catheterization was in May of 1999, and, at that time, he had  a patent free LIMA to the third diagonal, a patent SVG to the OM, a patent  free RIMA to the PDA and PLA.  He had a Cardiolite study in December of this  year that showed no significant ischemic with an EF of 70%.  He has wide-  spread peripheral vascular disease, and has had previous right renal artery  PTA and stenting in April of 2000.  This has been stable since then.  He has  bilateral SFA occlusion with three vessel run-off.  Surgery has been  discussed, but the patient says his symptoms are not bad enough at this  time.  He also has had a right carotid endarterectomy in 1996, and left  carotid artery stenosis, which we are following by Doppler.  He has a left  subclavian occlusion, but has declined surgery.  He has actually done fairly  well from a cardiac standpoint.  This past Monday, he saw Dr. Henrene Pastor, his  gastroenterologist, for an endoscopy.  He says he did not have an esophageal  dilatation.  The day after his endoscopy, he developed substernal chest  burning and tachycardia at home.  He said his heart rate was over 120.  He  went to the emergency room at [facility], was  admitted, and is  transferred now for further evaluation.  His EKG there showed sinus rhythm.  His enzymes were negative x3 at [facility].  He says that  nitroglycerin did help his symptoms.   PAST MEDICAL HISTORY:  Remarkable for hyperlipidemia.  He has been  intolerant to statins in the past.  He has had a report appendectomy.  He  has esophageal reflux and esophageal stricture, and has had esophageal  dilatation 6 times in the past.   CURRENT MEDICATIONS:  1. Atenolol 12.5 mg daily.  2. Nexium 40 mg b.i.d.  3. Aspirin 81 mg daily.  4. Multivitamin one daily.  5. Zetia 10 mg daily.  6. Plavix  75 mg daily.   ALLERGIES:  1. He is allergic to SULFA.  2. Intolerant of STATINS, as noted above.   SOCIAL HISTORY:  He is married.  He has 2 children.  He is a half pack a day  smoker.   FAMILY HISTORY:  His mother died at 71 of a stroke.  Father died at 51 of an  abdominal aneurysm.  He has 1 sister with a history of diabetes.   REVIEW OF SYSTEMS:  Remarkable for claudication when he walks up hill or at  a fast pace.  Review of systems otherwise unremarkable, except as noted  above.   PHYSICAL EXAMINATION:  VITAL SIGNS:  Blood pressure 128/67, pulse 68,  temperature 99.  GENERAL:  He is a well-developed, well-nourished male in no acute distress.  HEENT:  Normocephalic.  Extraocular movements are intact.  Sclerae are  anicteric.  Conjunctivae within normal limits.  NECK:  Without JVD.  He has bilateral carotid bruits.  CHEST:  Clear to auscultation and percussion.  CARDIAC:  Regular rate and rhythm, with a 2/6 systolic murmur at the aortic  __________ and left sternal border with a normal S1 and S2.  ABDOMEN:  Nontender.  No hepatosplenomegaly was appreciated.  EXTREMITIES:  No edema.  He has bilateral femoral artery bruits and  diminished distal pulses in his left upper extremity and bilateral lower  extremities.  NEUROLOGIC:  __________ intact.  He is awake, alert,  oriented, cooperative,  and moves all extremities without obvious deficit.  SKIN:  Warm and dry.   EKG shows sinus rhythm with a right bundle branch block.  Chest x-ray shows  COPD.   LABORATORY DATA:  Sodium 141, potassium 3.9, BUN 13, creatinine 1.0, INR  1.1.  CK and troponins are negative x3.  White count 5.8, hemoglobin 14.1,  hematocrit 40.2, platelets 203.  His TSH is 0.19.   IMPRESSION:  1. Unstable angina.  2. Coronary disease, coronary artery bypass grafting in 1994, with re-do     surgery in 1996, and native right coronary artery stenting in 1997, and     negative Cardiolite study in December of 2004.  3. Good left ventricular function.  4. Peripheral vascular disease with prior right carotid endarterectomy,     prior right renal artery stenting, known occluded superficial femoral     arteries bilaterally, being treated medically, and known left subclavian     artery stenosis.  5. Hyperlipidemia, statin intolerant.  6. Gastroesophageal reflux disease with stricture.  The patient recently had     an endoscopy this past Monday.  7. Smoking and chronic obstructive pulmonary disease.  The patient continues     to smoke about a half a pack per day.  8. Palpitations with a low TSH, question of hyperthyroidism, and arrhythmia.   PLAN:  He will be admitted to telemetry.  Will continue to monitor.  Will go  ahead and check a TSH, free T4, and T3.  At this point, Dr. Gwenlyn Found does not  feel he needs to proceed to catheterization.      Erlene Quan, P.A.                      Quay Burow, M.D.    Meryl Dare  D:  06/25/2003  T:  06/25/2003  Job:  [id number]   cc:   Delfino Lovett A. Rollene Fare, M.D.  [phone] [address] [place] [address]  Fax: [phone]   Dewitt Hoes  [place] [address]  Fax: [phone]

## 2010-07-23 NOTE — Discharge Summary (Signed)
NAME:  [name], [name]                            ACCOUNT NO.:  [id number]   MEDICAL RECORD NO.:  [id number]                   PATIENT TYPE:  INP   LOCATION:  3729                                 FACILITY:  [facility]   PHYSICIAN:  Quay Burow, M.D.                DATE OF BIRTH:  1946-02-25   DATE OF ADMISSION:  06/25/2003  DATE OF DISCHARGE:  06/26/2003                                 DISCHARGE SUMMARY   DISCHARGE DIAGNOSES:  1. Chest burning, negative myocardial infarction.  2. Coronary artery disease, status post coronary artery bypass graft and     redo coronary artery bypass graft.  Negative Cardiolite December 2004.  3. Gastroesophageal reflux disease and esophageal stricture with recent     esophagogastroduodenoscopy by Dr. Henrene Pastor, on June 23, 2003, with     stricture.  4. Peripheral vascular disease with a right renal artery stent and left SCA     stenosis.  5. Hyperlipidemia with intolerance to statins.  6. History of tobacco, chronic obstructive pulmonary disease.  7. Tachycardia with palpitations.  8. Dizziness.   CONDITION ON DISCHARGE:  Improved.   PROCEDURES:  None.   DISCHARGE MEDICATIONS:  1. Enteric coated aspirin 81 mg daily.  2. Tenormin 25 mg one daily, up from 12.5 day.  3. Zetia 10 mg daily.  4. Nexium 40 mg one twice a day.  5. Plavix 75 mg daily.  6. Meclizine 25 mg one every eight hours x 2 days and as needed.  7. Guaifenesin 1200 mg twice a day.  8. Nitroglycerin 1:150 sublingual p.r.n. chest pain.   DISCHARGE INSTRUCTIONS:  1. No strenuous activity.  2. Low-fat, low-salt diet.  3. You will need a Persantine Cardiolite.  The office will call with date     and time.  You will followup with Dr. Rollene Fare, and again the office     will call with date and time.   HISTORY OF PRESENT ILLNESS:  A 65 year old white, married male with a  history of coronary disease and peripheral vascular disease with bypass  surgery at [facility] in 1984, a bypass  grafting and a redo bypass surgery,  in January 1996, by Dr. Redmond Pulling.  In 1997, the patient had a stent placed to  his native RCA, and his last cath was May 1999, and he had a patent free  LIMA to the third diagonal and a patent saphenous vein graft to the OM, a  patent free LIMA to the PDA and PLA.  Recently, a Cardiolite study was done,  in December 2004, that showed no significant ischemia, EF of 70%.  He was  recently admitted to [facility], after having chest burning.  He  had been having some discomfort in his chest and had an endoscopy with Dr.  Henrene Pastor, here at [facility].  He does have a stricture and did not  have dilatation.  Then he developed substernal chest burning and  tachycardia.  He states his heart rate was over 120.  He went to the  emergency room at [facility] and ruled out for myocardial infarction.  Pain was nitrate responsive.  He was brought to [facility] for  further evaluation.   PAST MEDICAL HISTORY:  1. Cardiac as stated.  2. Additionally, he has peripheral vascular disease with right renal artery     PTA and stenting in April 2000.  3. He has bilateral SFA occlusion with three vessel runoff.  Surgery has     been discussed, but the patient says his symptoms are not bad enough at     this time.  4. He also has had a history of right carotid endarterectomy in 1996, and he     still has left carotid stenosis which is followed by Doppler.  5. He has a history of left subclavian occlusion but has declined surgery.  Other history:  1. Hyperlipidemia, intolerant to statins.  2. He has esophageal strictures as stated.  He has a history of esophageal     dilatations x 6 in the past.   OUTPATIENT MEDICINES:  1. Atenolol 12.5 daily.  2. Nexium 40 b.i.d.  3. Aspirin 81 daily.  4. Multivitamin daily.  5. Zetia 10.  6. Plavix 75.   ALLERGIES:  1. SULFA.  2. STATINS.   Social history, family history, review of systems:  See  H&P.   PHYSICAL EXAMINATION:  At discharge:  VITAL SIGNS:  Blood pressure 100/52, on recheck lying 126/70, sitting  116/68, and standing 130/74.  Pulse 52 with respirations of 20, and temp  97.5.  Oxygen saturation room air 97%.  HEART:  S1 S2, regular, rate and rhythm.  LUNGS:  Clear.  ABDOMEN:  Soft, nontender.  Positive bowel sounds.  EXTREMITIES:  Without edema.   LABORATORY DATA:  Cardiac enzymes were negative for any infarction.  Hemoglobin 12.7, hematocrit 37, WBCs 6, platelets 179.  Sodium 141,  potassium 3.5.  He was given supplementation prior to discharge.  BUN 12,  creatinine 1.0, and glucose 102.  LFTs were normal and troponin I was 0.01,  CK was 110, MB 2.1.  TSH was slightly low at 0.193.  Free T3 was normal as  well as free T4.   HOSPITAL COURSE:  The patient was admitted from [facility] to [facility] for further evaluation.  He had no further discomfort, or  pain, or tachycardia.  He as monitored overnight.  Cardiac enzymes remained  negative.  His only complaint was dizziness when standing, felt to be  related to allergies.  The dizziness was clearly head positional, and he did  have some bilateral nystagmus.  The patient was seen by both Dr. Rex [name] and  Dr. Rollene Fare prior to discharge, felt okay to be discharged but there could  be some possible ischemia; therefore, we will repeat a Persantine Cardiolite  in the next 1-2 weeks and followup with Dr. Rollene Fare.  Symptoms certainly  could be related to the EGD and then now that they have resolved with the  negative enzymes, but we will certainly follow this up.   Please note:  The patient will need a TSH in one month for further followup.      Otilio Carpen. Dorene Ar, N.P.                     Quay Burow, M.D.    LRI/MEDQ  D:  06/26/2003  T:  06/29/2003  Job:  [id number]   cc:   Delfino Lovett A. Rollene Fare, M.D.  [phone] [address] [place] [address]  Fax: [place]  [address]  [place] [address]  Fax: [place] Henrene Pastor, M.D. [facility]

## 2010-07-23 NOTE — Discharge Summary (Signed)
[name], [name]                  ACCOUNT NO.:  [id number]   MEDICAL RECORD NO.:  [id number]          PATIENT TYPE:  INP   LOCATION:  3739                         FACILITY:  [facility]   PHYSICIAN:  [name], M.D.DATE OF BIRTH:  1945-10-04   DATE OF ADMISSION:  12/10/2003  DATE OF DISCHARGE:  12/18/2003                                 DISCHARGE SUMMARY   PRIMARY CARE PHYSICIAN:  Dr. Dewitt [name]   ADMITTING DIAGNOSES:  1.  Unstable angina.  2.  History of coronary artery disease.      1.  History of coronary artery bypass graft 1984, [place].      2.  Status post redo coronary artery bypass graft January 1996.      3.  Stent to the right coronary artery 1997.      4.  Stent to the left main Aug 05, 2003.      5.  Stent to the left anterior descending Aug 05, 2003.  3.  Peripheral vascular disease.      1.  PTA to the right renal artery April 2000.      2.  PTA right renal artery Aug 05, 2003.      3.  Left subclavian occlusion.      4.  History of right carotid endarterectomy.      5.  Known left internal carotid artery disease.      6.  Known bilateral superficial femoral artery occlusion with three          vessel runoff.  4.  Gastroesophageal reflux disease with history of EGD and dilatation by      Dr. Henrene [name].  5.  Chronic obstructive pulmonary disease with ongoing tobacco use.  6.  Hyperlipidemia with multiple Statin intolerances.  7.  History of thyroid disease.  8.  Last catheterization in May 2005 with PTCA and stent to the left main      and the proximal left anterior descending.  There was also PTCA of the      mid left anterior descending and large septal perforator.  9.  Status post EECP therapy without relief.   DISCHARGE DIAGNOSES:  1.  Unstable angina.  2.  History of coronary artery disease.      1.  History of coronary artery bypass graft 1984, [place].      2.  Status post redo coronary artery bypass graft January 1996.      3.  Stent to  the right coronary artery 1997.      4.  Stent to the left main Aug 05, 2003.      5.  Stent to the left anterior descending Aug 05, 2003.  3.  Peripheral vascular disease.      1.  PTA to the right renal artery April 2000.      2.  PTA right renal artery Aug 05, 2003.      3.  Left subclavian occlusion.      4.  History of right carotid endarterectomy.      5.  Known left internal carotid  artery disease.      6.  Known bilateral superficial femoral artery occlusion with three          vessel runoff.  4.  Gastroesophageal reflux disease with history of EGD and dilatation by      Dr. Henrene [name].  5.  Chronic obstructive pulmonary disease with ongoing tobacco use.  6.  Hyperlipidemia with multiple Statin intolerances.  7.  History of thyroid disease.  8.  Last catheterization May 2005 with PTCA and stent to the left main and      the proximal left anterior descending.  There was also PTCA of the mid      left anterior descending and large septal perforator.  9.  Status post EECP therapy without relief.  10. Status post cardiac catheterization by Dr. Terance [name] December 11, 2003.  He did intervention with intervention to the vein graft to the      obtuse marginal vessel.  See the dictated report for details.  11. On December 11, 2003 he underwent intervention to the right renal artery      for in-stent restenosis.  Again, see the dictated report.  12. Status post staged intervention on December 16, 2003 secondary to      recurrent chest pain.  At that point he underwent intervention to the      left anterior descending after the diagonal 3.  As well, he performed      intervention to the right coronary artery.  Again, see the report      dictation for details.   HISTORY OF PRESENT ILLNESS:  [name] is a 65 year old white male with an  extensive cardiovascular history.  He presented with complaints of chest  pain.  He had chest pain on the night prior to admission around 10:30 p.m.   It was relieved with a single spray of nitroglycerin.  He then had a second  episode at about 12:30 a.m.  He used three nitroglycerin sprays spaced five  minutes apart without any relief.  He called EMS and was taken to [facility].  The pain resolved about 20 minutes after the third spray of  nitroglycerin.  As well, he had experienced shortness of breath and  diaphoresis associated with the chest pain, but no nausea.  These episodes  were similar to his previous anginal attacks.  He had had no further pain  since he arrived to the ER at [facility].   In general, he reported that he had been experiencing chest pain with  activity, especially when his nitroglycerin patch resolves or near the end  of the time.  He would frequently use a second patch in the evening if he  had activities planned.  Most of his pain had recurred after Norvasc had  been discontinued.  Norvasc had been stopped and atenolol was decreased  because of hypotension.  He was evaluated at [facility] and then  transferred to [facility] for further evaluation and possible  cardiac catheterization.  At the time of our evaluation he was stable.   At that point his blood pressure was 130/50.  Saturations are good.  No  significant abnormalities on examination.  At this point he is seen and  evaluated by Dr. Quay [name].  Planned to admit him to telemetry, check  enzymes, rule out MI.  He is currently pain-free.  EKG is without changes.  Examination shows no significant problems.  He is planned for  IV heparin, IV  nitroglycerin, and probable catheterization in the morning.   HOSPITAL COURSE:  On December 11, 2003 [name] underwent cardiac  catheterization by Dr. Terance [name].  See his dictation for details.  He ultimately performed PTCA stent to the saphenous vein graft to the OM.  As well, on December 11, 2003 he performed intervention to the right renal artery secondary to in-stent  restenosis.  Again, see the dictation for the  findings.  It was noted that he did have significant residual disease of the  LAD and RCA.  He planned for medical therapy at this time.  We would do a  staged intervention if his symptoms recurred.   On December 12, 2003 he was seen by Dr. Rollene [name].  His groin site was stable,  __________ stable.  At that point he planned to ambulate, have smoking  cessation consult, adjust medications, and plan for discharge in the morning  if stable.  If he had any recurrent chest pain then we would need to do  staged intervention to the LAD.  As well, it was noted he would need  outpatient renal Dopplers given his renal artery intervention.   Later that day in the morning of December 12, 2003 he developed chest pain  2/10,  just with repositioning himself while trying to eat breakfast.  This  was about 30 minutes after the nitroglycerin had been turned off.  At that  point we resumed nitroglycerin.  It was felt that he would need repeat  catheterization and staged intervention on Monday.   The following morning on December 13, 2003 patient told Dr. Melvern Banker that he had  had some recurrent chest pain that morning at 4 a.m., but was without chest  pain at the time of our evaluation.  At that point he restarted heparin and  Aggrastat and planned for repeat catheterization Monday.   On December 14, 2003 groin site slightly swollen.  By the 10th it seemed to  have small hematoma and a bruit.  We obtained Doppler scan which shows no  evidence of pseudoaneurysm or AV fistula.   He was continued on medications through the weekend and was planned for  intervention on Monday.   On December 16, 2003 he underwent complex intervention by Dr. Rollene [name].  Please see the dictation for detail.  The saphenous vein graft to OM stent  was widely patent.  However, he has 95% segmental LAD after the diagonal 3  which was grafted.  He performed intervention to this area.  He also  had a  90% RCA which he then angioplastied.  He tolerated procedure well without  complications.  At that point it was felt by Dr. Rollene [name] that there were  no further interventional options available, given his anatomy.  We plan to  continue medical therapy for remainder of his disease.  It was felt that if  he developed LAD occlusion post PCI then this will be medically treated.   On December 17, 2003 Mr. Cubero is stable.  Left groin site stable.  The right  groin has a small hematoma.  At that point he was evaluated by Dr.  Rollene [name].  He plans to transfer to telemetry floor, ambulate, and plan for  discharge home in the morning if stable.   On December 18, 2003 Mr. Traw is doing well.  He is without complaints.  He  is afebrile at 98.1, pulse 83, blood pressure 132/82.  Laboratories are  stable.  Groin site stable.  At this point he is seen and evaluated by Dr.  Adrian Prows, who deems him stable for discharge home.   CONSULTS:  None.   PROCEDURES: 1.  Cardiac catheterization on December 11, 2003 by Dr. Terance [name].      See the dictated report for detail.  At that time he performed      intervention to the saphenous vein graft to obtuse marginal vessel.  As      well, he performed intervention to the right renal artery secondary to      in-stent restenosis.  2.  Catheterization and intervention by Dr. Rollene [name] on December 16, 2003.      At that time he performed intervention to the left anterior descending      after the diagonal 3 and he also performed intervention to the right      coronary artery.  Again, see the dictated report for detail.   LABORATORIES:  Upon admission on December 10, 2003 CBC at that point shows  white count 7.5, hemoglobin 13.3, hematocrit 38.2, platelet 160.  Cardiac  enzymes at that point show first set CK 109, MB 5.7, troponin 0.32.  TSH low  at 0.189.  Lipid profile shows total cholesterol 159, triglyceride 117, HDL  43, LDL 93.  Second set of enzymes  shows CK 97, MB 3.7, troponin 0.28.  Next  set of enzymes on October 8 shows CK 185, MB 5.8, troponin 2.85.  Next set  CK 196, MB 6.0, troponin 2.38.  Following set on October 9 showed CK 140, MB  3.8, troponin 2.43.  Magnesium is normal at 1.9.  By time of discharge home  on October 13 electrolytes showed sodium 137, potassium 4.1, glucose 91, BUN  12, creatinine 1.2.  CBC from December 17, 2003 with white count 7.3,  hemoglobin 11.9, hematocrit 32.0, platelet 150.   DISCHARGE MEDICATIONS:  1.  Aspirin 81 mg two tablets daily.  2.  Plavix 75 mg daily.  3.  Atenolol 25 mg daily.  4.  Colace 100 mg daily.  5.  Nitroglycerin patch 0.4 mg q.1h.  6.  Lovastatin 40 mg at night.  7.  Zetia 10 mg daily.  8.  Humibid LA 600 mg b.i.d.  9.  Norvasc 2.5 mg daily.  10. Nicotine patch 14 mg.  11. Nexium 40 mg daily.  12. Flomax 0.4 mg daily.   ACTIVITY:  No strenuous activity or lifting over 5 pounds, no sexual  activity for three days.   DISCHARGE DIET:  Low cholesterol diet.   WOUND CARE:  May gently wash groin site.   Call [phone] if any bleeding, __________ in the groin site.   FOLLOWUP:  Prior to him going home, I will schedule him for follow-up renal  Doppler and also an appointment to see Dr. Rollene [name].       MBE/MEDQ  D:  12/18/2003  T:  12/18/2003  Job:  [id number]   cc:   [name]  [address]  [place] [address]  Fax: [phone]

## 2010-07-23 NOTE — Cardiovascular Report (Signed)
[name], [name]                  ACCOUNT NO.:  [id number]   MEDICAL RECORD NO.:  [id number]          PATIENT TYPE:  INP   LOCATION:  2923                         FACILITY:  [facility]   PHYSICIAN:  Richard A. Rollene Fare, M.D.DATE OF BIRTH:  01-29-1946   DATE OF PROCEDURE:  12/11/2003  DATE OF DISCHARGE:                              CARDIAC CATHETERIZATION   PROCEDURES PERFORMED:  1.  Retrograde central aortic catheterization.  2.  Selective coronary angiography by Judkins technique.  3.  Left ventricular angiogram, right anterior oblique projection.  4.  Saphenous vein graft angiography.  5.  Selective right internal mammary artery, free graft.  6.  Abdominal aortic angiogram, midstream posterior anterior projection.  7.  Bilateral selective renal angiogram by hand injection.  8.  Aggrastat double bolus plus infusion.  9.  Continued Plavix and aspirin.  10. Percutaneous transluminal angioplasty and drug-eluting stent, high-grade      95%, tandem, midbody saphenous vein graft-obtuse marginal.  11. Percutaneous transluminal angioplasty and drug-eluting stent high-grade      ostial and proximal saphenous vein graft to obtuse marginal in the      setting of unstable angina, acute coronary syndrome with subtotal      stenosis and graft occlusion in laboratory.  12. Right renal artery cutting balloon atherectomy and subsequent      percutaneous transluminal angioplasty for high-grade in-stent restenosis      with jeopardized kidney, and severe systemic hypertension with bilateral      renal artery stenosis.   CARDIOLOGIST:  Richard A. Rollene Fare, M.D.   BRIEF HISTORY:  Please refer to the outpatient catheterization of Jul 31, 2003 and patient's last intervention of Aug 05, 2003 for further complete  details.   Essentially, [name] is a married father of two with severe diffuse  cardiovascular disease, continued cigarette abuse and intolerance to all  statins with associated  hyperlipidemia, CAD, CABG, redo CABG, multiple past  PCIs, and right renal stent.  He has a very complex anatomy and essentially  has bilateral iliac disease, moderately severe, with bilateral known  superficial femoral artery occlusion.  In addition, he has severe essential  hypertension, but we are unable to get accurate blood pressure recordings  because he has total left subclavian occlusion and high grade 70-80% right  subclavian stenosis redocumented at last catheterization.  He has also had  remote right renal artery PTA and stenting for severe renovascular  hypertension in April 2000 with redilatation for severe in-stent restenosis  in May 2005, with moderate left renal artery stenosis.  Ejection fraction  was approximately 45%.   The patient had remote CABG in 1985 at [facility].  All grafts were  occluded and he had redo CABG, April 05, 1994, at [facility] by Dr. Redmond Pulling with  free RIMA to the twin diagonal, SVG to obtuse marginal, and sequential SVG  to right coronary artery posterior descending artery.  Subsequent to that he  required PCI for unstable angina and had left main coronary artery protected  stenting with a GR-3.0/20 stent (August 22, 1995).  He had a  staged right  coronary artery PS stent after documented graft occlusion (September 04, 1995).  He has chronic stable angina and chronic claudication, which has been  stable.  He has been intolerant to all statins and continues to smoke.   The patient's last intervention was in the setting of positive Cardiolite,  recurrent unstable angina on Aug 05 2003.  He had a sandwiched DES, 2.5 x 13  Cypher stent, placed across the high grade restenosis of the main left and  proximal LAD because of jeopardized diagonal-1 and diagonal-2.  He had  balloon dilatation with a 2.75 balloon of the LAD beyond the diagonal-3 and  large septal perforator branch.  The free RIMA was patent to the diagonal-3  and there was some retrograde filling to the  LAD; and, there were Grade 3  collaterals from the diagonal-3 to the distal right coronary artery  posterior descending artery, total occlusion of the circumflex after a small  marginal and total occlusion of the right after a right ventricular branch  with high grade 80-90% segmental proximal RCA stenosis.   The patient improved clinically after that procedure, but then developed  recurrent angina several months later and had EECP therapy along with  continued medical therapy.  His angina was chronic, but stable, until he was  recently admitted to [facility] with unstable angina on December 09, 2003, and then subsequently transferred to our hospital on medical therapy  with myocardial infarction ruled out by serial enzymes and EKGs.  He was  referred for cardiac catheterization today in this setting.  He was on  heparin.  He has been on chronic aspirin and Plavix as an outpatient, but  once again has been intolerant to statins in the past, and was recently  tried on Pravachol, which he has thus far tolerated.  Most of this has been  from lower extremity muscular aches and pains that appear to be separate  from his chronic claudication.   DESCRIPTION OF PROCEDURE:  Informed consent was obtained to proceed with the  procedure.  We knew that ___________ because of multiple prior procedures  and past severe aortoiliac disease.   The right groin was prepped and draped on the usual manner.  One percent  Xylocaine was used for local anesthesia and the common right femoral artery  was entered with an anterior puncture using an 18 thin-wall needle.  We were  able to put a 6 [place] Daig sidearm sheath in.  Diagnostic coronary  angiography was done with a 6 French 4 cm taper right coronary catheter and  a 3.5 cm taper left coronary catheter because of the _________ aortic root.  Vein graft on CRMA catheterization was done with the right coronary catheter.  LV angiogram was done in the RAO  projection with 25 mL at 20 mL  per second with pullback pressure in the central aorta and no gradient  across the aortic valve.   The patient had severe central arterial hypertension with systolic blood  pressures ranging from [id number] and diastolic approximately 123456.  IC  nitroglycerin was instituted and intracoronary nitroglycerin was given in  the laboratory.  Selective right internal mammary artery free graft and  saphenous vein graft were done with the right coronary catheter.  The right  renal artery stent was easily visualized and because of severe hypertension  and access problems single hand injections were done with the right coronary  catheter of the right and left renal arteries.   Abdominal angiogram  was done above the iliac bifurcation with 25 mL at 20 mL  per second with runoff to the proximal superficial femoral arteries  bilaterally.  There was no gradient across the aortic valve on catheter  pullback.   The main left coronary artery extending into the proximal left anterior  descending showed a widely patent previously placed drug-eluting stent (Aug 05, 2003) with no significant restenosis (less than 20%).  There was good  flow to both the moderate-sized diagonal-1 and diagonal-2.  The diagonal-3  had competitive flow on antegrade injection, and the LAD beyond the diagonal-  3 had 95% segmental stenosis in the area of prior PTCA.  The LAD had diffuse  disease, but was patent to the apex with no single high-grade stenosis.  There was a large septal perforator proximal to the 90% LAD stenosis in the  midportion.   The circumflex artery was totally occluded after a small obtuse marginal-1  branch.   The right coronary artery showed 85-90% tandem segmental stenosis in the  proximal third.  It was totally occluded beyond a large RV branch that had  antegrade flow.  There was no antegrade flow to the distal right coronary  artery.  The distal right coronary artery  posterior descending artery filled  via grade 3 collaterals through the [place] to the diagonal-3.  It also filled  via collaterals from the saphenous vein graft to the obtuse marginal.  The  SVG to the right coronary artery was totally occluded, which is chronic.  The RIMA to the large twin diagonal-3 was widely patent and smooth with an  excellent anastomosis and good antegrade flow; and, there was some  retrograde filling of the left anterior descending; and, as mentioned grade  3 collaterals to the distal RCA through the twin third diagonal.   The saphenous vein graft to the obtuse marginal showed 70-80% ostial  stenosis and 60% segmental proximal stenosis.  There was a high-grade  complex 95-99% stenosis of the midbody of the graft with an associated  tandem 90% stenosis.  There was TIMI II-III flow initially, but there was  damping of the diagnostic and subsequent guiding catheters near the ostium.  LV angiogram demonstrated hypokinesis of the mid anterolateral wall and  akinesis of the mid and distal third of the inferior wall.  Estimated EF was  approximately 45%.  There was +1 mitral regurgitation.  LAO projection was  not performed.   The right renal artery had 90-95% ostial with in-stent restenosis and 30-40%  stenosis of the remainder.  The left renal artery had approximately 50% to  60% ostial and proximal narrowing.  There was a 70-80 mmHg gradient across  the right renal artery stenosis.   The lower abdominal aorta showed mild dilatation before the iliac  bifurcation.  There was diffuse disease of the iliacs bilaterally with some  mild aneurysmal dilatation of the left common iliac.  The hypogastrics were  intact bilaterally.  There was diffuse disease of the external iliacs of  about 40-50% bilaterally, but good flow.  The profunda were intact  bilaterally and the superficial femoral arteries were occluded proximally  bilaterally with no antegrade filling.   Right after  diagnostic angiography the patient began having chest pain and  PVCs, and reinjection of the SVG to the OM showed that this was occluded.  We then proceeded to urgent PCI in this setting.   Initially the saphenous vein graft was intubated with a JR-4 6 [place] Sci-  Med  guiding catheter and the lesion was crossed with a 0.014 inch Asahi soft  guidewire.  The patient had been given weight-adjusted heparin and Aggrastat  bolus plus infusion (25 mcg/kg) and was begun monitoring ACTs (see log sheet  for heparin dosages).  We tried to cross the wire initially with this and  then we tried to pass a Sci-Med EZ filter wire across the lesion, but  because of poor backup because of the ostial stenosis we were not able to  cross with the filter wire despite the buddy wire.  In order to restore flow  the mid SVG was dilated with a 2.0/15 Maverick balloon and then upgraded to  a 3.0/15 Maverick balloon.  Reattempts to cross both were done at seven to  eight atmospheres for 25-28 seconds.  Attempt to recross with the filter  wire was again unsuccessful because the backups of this was abandoned.  We  then initially tried to cross with a TAXUS Express-2 3.0 stent; however,  this would not cross because of poor backup, so this was removed.  Dilatation system had to be removed and we had to use several guiding  catheters in order to finally get one that would provide backup and seat  properly.  After several 6 [place] guides were tried, including AL-2,  multipurpose, hockey stick, and Cordis JR-4 we then went to a Sci-Med AL-  0.75 6 [place] guide.  There was damping at the ostium, but this was  controlled with catheter manipulation.  The lesion was recrossed with a  0.014 inch Asahi soft wire, which was free in the distal vessel.  We were  then able to stent the mid SVG body with a 3.0/20 TAXUS drug-eluting stent,  which was deployed at 12-28 and post dilated at 18-31.  The balloon was pulled back and  injections showed excellent results in the mid graft body  with 0% residual stenosis and good flow to the distal vessel with no  evidence of no reflow or distal emboli.  The ostium, however, was severely  disease at 70-80% with 60% proximal narrowing seen on multiple orthogonal  views.  It was elected to proceed with stenting of this, which was done with  a 3.0/12 drug-eluting stent TAXUS stent precisely positioned to just overlap  the ostium and cover it, deployed at 12-35 and post dilated at 20-33.  The  balloon was pulled back and final injections showed excellent angiographic  result.  There was about 20% residual narrowing near the ostium; beyond this  was zero and there was good TIMI III flow to the distal marginal and  retrograde distal circumflex branch.   Approximately 300-325 mL of dye had been used at that time.  Blood pressure  was still 200 despite high-dose IV nitroglycerin, so it was elected to  proceed with right renal artery intervention.  We felt that this could be  done with minimal dye and indications were the patient's severe  hypertension, severe atherosclerotic disease and access problems as outlined  above.   A short IMA catheter was used to intubate the right renal artery stent under  fluoroscopic control.  The lesion was crossed with a stabilizer, 0.014 inch  Cordis wire.  It was then crossed with a 4 mm x 10 mm cutting balloon and  inflations were done at eight to 10 atmospheres in the ostium and proximal  portion of the stent.  The balloon was then exchanged for a 6 mm x 15 Cordis  Aviator balloon  and inflation was done at 7-30 with good balloon contour.  The balloon was removed and final injection showed stenosis from  approximately 85-90% to 50-60% secondary to elastic recoil.  We did not want  to proceed any further at this time in view of dye constraints and limited  options.  This will need to be followed up and he may require further  stenting if he has  another restenosis and/or compassion to use drug-eluting  stent.   The patient had chest pain prior to opening up his SVG to the OM.  This was  relieved with opening this up, which was done promptly in the laboratory  initially with balloon dilatation and then subsequent  stenting as outlined  above.  He had no further chest pain during the procedure.  He was given  intermittent Nubain for sedation.   The dilatation systems were removed.  Sidearm sheath was flushed and the  final ACT was 264 seconds.  Hemoglobin was 10.9 and will be followed  closely; and, type and screen was sent to the laboratory.   The patient was transferred to the CCU for postoperative care, sheath  removal and pressure hemostasis.  His right groin was stable at the end of  the procedure.   The patient has severe atherosclerotic disease as outlined above with severe  diffuse cardiovascular disease, peripheral and coronary.  He had a successful culprit lesion intervention for a new mid SVG to OM tandem  subtotal stenosis and progression of the proximal SVG and ostial disease  since cath of May [id number], which was certainly concerning for rapid  progression.  This was clearly his culprit lesion for his angina, but he  also had restenosis of his LAD beyond the diagonal-3, and no restenosis of  his sandwiched left main coronary stent.  If he has recurrent pain he is a  potential candidate for redilatation of his mid LAD, possible stenting if  technically able and possible PCI of his RCA, which supplies a large RV  branch.  Continued medical therapy and absolute cigarette abstinence if  possible.   CATHETERIZATION DIAGNOSES:  1.  Unstable angina - high-grade saphenous vein graft to obtuse marginal      midbody stenosis with intraprocedure occlusion, post diagnostic study      and subsequent urgent percutaneous coronary intervention as outlined      above with percutaneous transluminal coronary angioplasty and  subsequent      drug-eluting stent  stenting of the mid and ostial lesions.  2.  Patent free right internal mammary artery to the large diagonal-3 with      collaterals to the posterior descending artery and some flow to the left      anterior descending.  3.  Ninety percent restenosis, mid left anterior descending beyond diagonal-      3.  4.  No restenosis, sandwiched drug-eluting stent, main lest and proximal      left anterior descending stent from Aug 05, 2003.  5.  Occluded saphenous vein graft to right coronary artery, old.  6.  Occluded circumflex beyond the obtuse marginal and occluded right      coronary artery beyond the right ventricle with proximal right coronary      artery disease.  7.  Ejection fraction approximately 45% with segmental wall motion      abnormalities.  8.  Severe renovascular hypertension with successful cutting balloon      atherectomy and percutaneous transluminal renal angioplasty of right  renal artery in-stent restenosis as outlined above.  9.  Fifty to sixty percent left renal artery stenosis.  10. Moderate left carotid stenosis, asymptomatic.  11. Old total occlusion, LSCA.  12. Moderately severe stenosis RSCA.  13. Remote coronary artery bypass graft, [facility], 1984; all grafts      occluded with redo coronary artery bypass graft, Dr. Redmond Pulling, April 05, 1994 [facility].  30. Known bilateral superficial femoral artery occlusion with stable      claudication.  15. Status post RCEA, 1996; no restenosis on Dopplers.  16. Esophageal stricture and gastroesophageal reflux disease.  71. Hyperlipidemia with intolerance to statins; currently on lovastatin and      tolerating.  18. Renovascular hypertension.  19. Continued cigarette abuse.  20. Remote protected left main GR stenting, August 22, 1995, subsequent      sandwiched drug-eluting stent new protected left main stenting, Aug 05, 2003. 21. Remote right coronary  artery stent, September 04, 1995 (P-S).       RAW/MEDQ  D:  12/11/2003  T:  12/12/2003  Job:  [id number]   cc:   [facility]   [address]  [address]  [place]  [place] [address]  Fax: [phone]   [facility]. Einar Gip, [place] [address], [address] [place] [address]  Fax: [place]. Rollene Fare, M.D.  [place]. Rollene Fare, M.D.  [employer]

## 2010-07-23 NOTE — H&P (Signed)
[name], STANDARD                  ACCOUNT NO.:  [id number]   MEDICAL RECORD NO.:  [id number]          PATIENT TYPE:  INP   LOCATION:  2908                         FACILITY:  [place]   PHYSICIAN:  Richard A. Rollene Fare, M.D.DATE OF BIRTH:  09/21/1945   DATE OF ADMISSION:  12/10/2003  DATE OF DISCHARGE:                                HISTORY & PHYSICAL   REFERRING PHYSICIAN:  Dr. Dewitt Hoes in [place].   PRIMARY CARDIOLOGIST:  Dr. Terance Ice.   CHIEF COMPLAINT:  Chest pain.   BRIEF HISTORY:  The patient is a 65 year old white male well known to  [facility].  He had chest pain last night around 10:30  p.m.  It was relieved with a single spray of nitroglycerin.  He then had a  second episode around 12:30 a.m.  He used three nitroglycerin sprays spaced  5 minutes apart without any relief.  He called EMS and was taken to [facility].  The pain resolved about 20 minutes after his third spray of  nitroglycerin.  He has shortness of breath and diaphoresis with his chest  pain; no nausea.  This episode was like his previous anginal attacks.  He  has had no further pain since he got to the ER at [facility].   The patient has chest pain with activity, especially if his nitroglycerin  patch is off or near the end of his time.  The patient will frequently use a  second patch in the p.m. if he has activities planned.  Most of the pain has  reoccurred after Norvasc was discontinued.  Norvasc was stopped and atenolol  was decreased secondary to hypotension.  The patient was transferred to  [facility] today with a plan for recatheterization tomorrow to see  if his grafts and stents are still patent.   PAST MEDICAL HISTORY:  1.  Coronary artery disease status post coronary bypass grafting in 1984 in      [place], redo coronary bypass grafting in January 1996 - Dr. Merleen Nicely at [facility].  History of stent to the RCA in  1997.      Aug 05, 2003 - cardiac catheterization, PCA, and stenting to the left      main and LAD after the third diagonal.  2.  Peripheral vascular occlusive disease.  PTA of the right renal artery      with stent - April 2000.  PTA right renal artery cutting balloon - Aug 05, 2003.  Left subclavian occlusion.  Status post right carotid      endarterectomy with left ICA disease.  Bilateral superficial femoral      artery occlusion with three-vessel distal runoff and history of      claudication.  3.  Gastroesophageal reflux disease status post EGD and dilatation by Dr.      Henrene Pastor.  4.  COPD with ongoing tobacco use, down to three cigarettes per day.  5.  Hyperlipidemia, statin intolerant.  6.  History of thyroid disease; will  check his TSH.   HOME MEDICATIONS:  1.  Atenolol 25 mg daily.  2.  Nexium 40 mg b.i.d.  3.  Zetia 10 mg daily.  4.  Plavix 75 mg daily.  5.  Aspirin 81 mg daily.  6.  Multivitamin one daily.  7.  Vitamin E one daily.   SOCIAL HISTORY:  He is married, two children.  His cigarettes are half-a-  pack a day reported in June 2005; he is down to three cigarettes a day now.  He smoked one-and-a-half packs a day on and off for the better part of 37  years.  He denies alcohol or drug use.   FAMILY HISTORY:  Mother died at 76 with a stroke.  Father died at 33 with an  abdominal aortic aneurysm.  He has one sister living with diabetes.   REVIEW OF SYSTEMS:  He complains of lightheadedness and balance problems  over a year.  PULMONARY:  He has shortness of breath with exertion.  He  sleeps in a recliner secondary to his GERD.  No PND.  He has chronic cough  and recent bronchitis, treated with doxycycline, Avelox, and Mucinex.  GI:  He has significant gastroesophageal reflux disease followed by Dr. Henrene Pastor.  No diarrhea or constipation.  GU:  Normal.  LOWER EXTREMITIES:  Positive  claudication.  He has occasional lower extremity edema which resolves   overnight.   PHYSICAL EXAMINATION:  GENERAL:  Well-nourished, well-developed white male  in no acute distress.  VITAL SIGNS:  Blood pressure is 130/50, saturations 99% on 2 L nasal  cannula, temperature is 98.4.  HEENT:  Head:  Normocephalic.  Eyes:  PERLA, EOMs intact.  Fundi not  visualized.  Ears, nose, throat, mouth:  Grossly within normal limits.  NECK:  Positive bruits, left greater than right.  There is no JVD and no  thyromegaly.  CHEST:  Clear to auscultation and percussion.  CARDIAC:  No murmurs or rubs.  ABDOMEN:  Soft, positive bowel sounds.  No hepatosplenomegaly, no bruits.  GENITOURINARY/RECTAL:  Not medically indicated.  EXTREMITIES:  No clubbing, cyanosis, or edema.  NEUROLOGIC:  No focal deficits.   IMPRESSION:  1.  Recurrent chest pain status post coronary artery bypass grafting in      1984, redo in 1996; status post percutaneous transluminal coronary      angioplasty and stent to the right coronary artery in 1997, percutaneous      transluminal coronary angioplasty and stent to the left main and left      anterior descending coronary artery Aug 05, 2003.  2.  Peripheral vascular occlusive disease status post percutaneous      transluminal angioplasty and stent to the right renal in April 2000,      percutaneous transluminal angioplasty right renal with cutting balloon      on Aug 05, 2003.  Bilateral carotid disease status post right carotid      endarterectomy, left subclavian occlusion, and bilateral superficial      femoral artery occlusion.  3.  Chronic obstructive pulmonary disease with ongoing tobacco use, recent      history of bronchitis.  4.  Gastroesophageal reflux disease.  5.  Hyperlipidemia.   PLAN:  1.  Rule out MI.  2.  Recatheterize tomorrow.       WDJ/MEDQ  D:  12/10/2003  T:  12/10/2003  Job:  [id number]   cc:   Dewitt Hoes  [address]  [place] [address]  Fax: [phone]

## 2010-08-26 ENCOUNTER — Other Ambulatory Visit: Payer: Self-pay | Admitting: Internal Medicine

## 2010-08-27 NOTE — Telephone Encounter (Signed)
Nexium approved until 08/26/2011.  Pharmacy and patient notified.

## 2010-11-09 ENCOUNTER — Other Ambulatory Visit: Payer: Self-pay | Admitting: Internal Medicine

## 2010-11-10 MED ORDER — ESOMEPRAZOLE MAGNESIUM 40 MG PO CPDR: 40.0000 mg | DELAYED_RELEASE_CAPSULE | Freq: Every day | ORAL | Status: DC

## 2010-11-10 NOTE — Telephone Encounter (Signed)
Refill of Nexium sent to pharmacy.

## 2010-11-22 ENCOUNTER — Telehealth: Payer: Self-pay | Admitting: Internal Medicine

## 2010-11-22 MED ORDER — ESOMEPRAZOLE MAGNESIUM 40 MG PO CPDR: 40.0000 mg | DELAYED_RELEASE_CAPSULE | Freq: Two times a day (BID) | ORAL | Status: DC

## 2010-11-22 NOTE — Telephone Encounter (Signed)
Rx. Sent for bid

## 2011-01-21 ENCOUNTER — Encounter (HOSPITAL_COMMUNITY): Payer: Self-pay | Admitting: Pharmacy Technician

## 2011-01-24 ENCOUNTER — Encounter (HOSPITAL_COMMUNITY): Payer: Self-pay | Admitting: General Practice

## 2011-01-24 ENCOUNTER — Encounter (HOSPITAL_COMMUNITY)
Admission: RE | Disposition: A | Discharge status: Home / Self Care | Payer: Self-pay | Source: Ambulatory Visit | Attending: Cardiovascular Disease

## 2011-01-24 ENCOUNTER — Other Ambulatory Visit: Payer: Self-pay

## 2011-01-24 ENCOUNTER — Inpatient Hospital Stay (HOSPITAL_COMMUNITY)
Admission: RE | Admit: 2011-01-24 | Discharge: 2011-01-26 | DRG: 854 | Disposition: A | Discharge status: Home / Self Care | Payer: BC Managed Care – PPO | Source: Ambulatory Visit | Attending: Cardiovascular Disease | Admitting: Cardiovascular Disease

## 2011-01-24 DIAGNOSIS — E785 Hyperlipidemia, unspecified: Secondary | ICD-10-CM | POA: Diagnosis present

## 2011-01-24 DIAGNOSIS — I10 Essential (primary) hypertension: Secondary | ICD-10-CM | POA: Diagnosis present

## 2011-01-24 DIAGNOSIS — F430 Acute stress reaction: Secondary | ICD-10-CM | POA: Diagnosis present

## 2011-01-24 DIAGNOSIS — Z95828 Presence of other vascular implants and grafts: Secondary | ICD-10-CM

## 2011-01-24 DIAGNOSIS — I452 Bifascicular block: Secondary | ICD-10-CM

## 2011-01-24 DIAGNOSIS — I25810 Atherosclerosis of coronary artery bypass graft(s) without angina pectoris: Principal | ICD-10-CM | POA: Diagnosis present

## 2011-01-24 DIAGNOSIS — I9589 Other hypotension: Secondary | ICD-10-CM | POA: Diagnosis present

## 2011-01-24 DIAGNOSIS — D649 Anemia, unspecified: Secondary | ICD-10-CM | POA: Diagnosis present

## 2011-01-24 DIAGNOSIS — I2510 Atherosclerotic heart disease of native coronary artery without angina pectoris: Secondary | ICD-10-CM

## 2011-01-24 DIAGNOSIS — I739 Peripheral vascular disease, unspecified: Secondary | ICD-10-CM | POA: Diagnosis present

## 2011-01-24 DIAGNOSIS — I4510 Unspecified right bundle-branch block: Secondary | ICD-10-CM | POA: Diagnosis present

## 2011-01-24 DIAGNOSIS — Z951 Presence of aortocoronary bypass graft: Secondary | ICD-10-CM | POA: Diagnosis present

## 2011-01-24 DIAGNOSIS — I200 Unstable angina: Secondary | ICD-10-CM | POA: Diagnosis present

## 2011-01-24 HISTORY — DX: Other specified diseases of esophagus: K22.8

## 2011-01-24 HISTORY — DX: Other specified disease of esophagus: K22.89

## 2011-01-24 HISTORY — DX: Other chronic pain: G89.29

## 2011-01-24 HISTORY — DX: Reserved for inherently not codable concepts without codable children: IMO0001

## 2011-01-24 HISTORY — DX: Angina pectoris, unspecified: I20.9

## 2011-01-24 HISTORY — DX: Cardiac arrhythmia, unspecified: I49.9

## 2011-01-24 HISTORY — DX: Atherosclerotic heart disease of native coronary artery without angina pectoris: I25.10

## 2011-01-24 HISTORY — DX: Acute myocardial infarction, unspecified: I21.9

## 2011-01-24 HISTORY — DX: Bronchitis, not specified as acute or chronic: J40

## 2011-01-24 HISTORY — DX: Reserved for concepts with insufficient information to code with codable children: IMO0002

## 2011-01-24 HISTORY — PX: LEFT HEART CATHETERIZATION WITH CORONARY/GRAFT ANGIOGRAM: SHX5450

## 2011-01-24 HISTORY — DX: Shortness of breath: R06.02

## 2011-01-24 HISTORY — DX: Headache: R51

## 2011-01-24 HISTORY — DX: Anemia, unspecified: D64.9

## 2011-01-24 HISTORY — DX: Diaphragmatic hernia without obstruction or gangrene: K44.9

## 2011-01-24 HISTORY — DX: Gastro-esophageal reflux disease without esophagitis: K21.9

## 2011-01-24 HISTORY — PX: CORONARY ANGIOPLASTY WITH STENT PLACEMENT: SHX49

## 2011-01-24 HISTORY — DX: Stricture of artery: I77.1

## 2011-01-24 HISTORY — DX: Adverse effect of unspecified anesthetic, initial encounter: T41.45XA

## 2011-01-24 HISTORY — DX: Encounter for other specified aftercare: Z51.89

## 2011-01-24 HISTORY — DX: Dorsalgia, unspecified: M54.9

## 2011-01-24 HISTORY — DX: Hyperlipidemia, unspecified: E78.5

## 2011-01-24 LAB — PROTIME-INR
INR: 1.08 (ref 0.00–1.49)
Prothrombin Time: 14.2 seconds (ref 11.6–15.2)

## 2011-01-24 LAB — POCT ACTIVATED CLOTTING TIME
Activated Clotting Time: 342 seconds
Activated Clotting Time: 138 seconds
Activated Clotting Time: 342 seconds

## 2011-01-24 SURGERY — LEFT HEART CATHETERIZATION WITH CORONARY/GRAFT ANGIOGRAM
Anesthesia: LOCAL

## 2011-01-24 MED ORDER — METOPROLOL TARTRATE 1 MG/ML IV SOLN
5.0000 mg | Freq: Once | INTRAVENOUS | Status: AC
  Administered 2011-01-24: 5 mg via INTRAVENOUS

## 2011-01-24 MED ORDER — METOPROLOL TARTRATE 1 MG/ML IV SOLN
INTRAVENOUS | Status: AC
  Filled 2011-01-24: qty 5

## 2011-01-24 MED ORDER — ATENOLOL 25 MG PO TABS
25.0000 mg | ORAL_TABLET | Freq: Two times a day (BID) | ORAL | Status: DC
  Administered 2011-01-24: 25 mg via ORAL
  Filled 2011-01-24 (×3): qty 1

## 2011-01-24 MED ORDER — BUTALBITAL-ASPIRIN-CAFFEINE 50-325-40 MG PO CAPS: 1.0000 | ORAL_CAPSULE | ORAL | Status: DC | PRN

## 2011-01-24 MED ORDER — ROSUVASTATIN CALCIUM 40 MG PO TABS
40.0000 mg | ORAL_TABLET | Freq: Every day | ORAL | Status: DC
  Administered 2011-01-25: 40 mg via ORAL
  Filled 2011-01-24 (×2): qty 1

## 2011-01-24 MED ORDER — ATROPINE SULFATE 0.1 MG/ML IJ SOLN
1.0000 mg | Freq: Once | INTRAMUSCULAR | Status: AC
  Administered 2011-01-24: 1 mg via INTRAVENOUS

## 2011-01-24 MED ORDER — SODIUM CHLORIDE 0.9 % IV SOLN
1.0000 mL/kg/h | INTRAVENOUS | Status: DC
  Administered 2011-01-24 (×2): 1 mL/kg/h via INTRAVENOUS

## 2011-01-24 MED ORDER — BUTALBITAL-APAP-CAFFEINE 50-325-40 MG PO TABS
1.0000 | ORAL_TABLET | ORAL | Status: DC | PRN
  Administered 2011-01-24: 1 via ORAL
  Filled 2011-01-24 (×3): qty 1

## 2011-01-24 MED ORDER — HEPARIN (PORCINE) IN NACL 2-0.9 UNIT/ML-% IJ SOLN
INTRAMUSCULAR | Status: AC
  Filled 2011-01-24: qty 2000

## 2011-01-24 MED ORDER — ACETAMINOPHEN 325 MG PO TABS: 650.0000 mg | ORAL_TABLET | ORAL | Status: DC | PRN

## 2011-01-24 MED ORDER — MORPHINE SULFATE 4 MG/ML IJ SOLN
3.0000 mg | Freq: Once | INTRAMUSCULAR | Status: AC
  Administered 2011-01-24: 3 mg via INTRAVENOUS

## 2011-01-24 MED ORDER — NITROGLYCERIN IN D5W 200-5 MCG/ML-% IV SOLN
INTRAVENOUS | Status: AC
  Filled 2011-01-24: qty 250

## 2011-01-24 MED ORDER — PANTOPRAZOLE SODIUM 40 MG PO TBEC
40.0000 mg | DELAYED_RELEASE_TABLET | Freq: Every day | ORAL | Status: DC
  Administered 2011-01-24: 40 mg via ORAL
  Filled 2011-01-24: qty 1

## 2011-01-24 MED ORDER — THERA M PLUS PO TABS
1.0000 | ORAL_TABLET | Freq: Every day | ORAL | Status: DC
  Administered 2011-01-25 – 2011-01-26 (×2): 1 via ORAL
  Filled 2011-01-24 (×2): qty 1

## 2011-01-24 MED ORDER — DIAZEPAM 5 MG PO TABS
5.0000 mg | ORAL_TABLET | ORAL | Status: AC
  Administered 2011-01-24: 5 mg via ORAL
  Filled 2011-01-24: qty 1

## 2011-01-24 MED ORDER — EPTIFIBATIDE 75 MG/100ML IV SOLN
2.0000 ug/kg/min | INTRAVENOUS | Status: DC
  Administered 2011-01-24: 2 ug/kg/min via INTRAVENOUS
  Filled 2011-01-24 (×2): qty 100

## 2011-01-24 MED ORDER — ATROPINE SULFATE 1 MG/ML IJ SOLN
INTRAMUSCULAR | Status: AC
  Filled 2011-01-24: qty 1

## 2011-01-24 MED ORDER — MORPHINE SULFATE 4 MG/ML IJ SOLN
INTRAMUSCULAR | Status: AC
  Filled 2011-01-24: qty 1

## 2011-01-24 MED ORDER — ASPIRIN EC 81 MG PO TBEC
81.0000 mg | DELAYED_RELEASE_TABLET | Freq: Every day | ORAL | Status: DC
  Administered 2011-01-24 – 2011-01-26 (×3): 81 mg via ORAL
  Filled 2011-01-24 (×3): qty 1

## 2011-01-24 MED ORDER — EPTIFIBATIDE 75 MG/100ML IV SOLN
INTRAVENOUS | Status: AC
  Filled 2011-01-24: qty 100

## 2011-01-24 MED ORDER — MORPHINE SULFATE 10 MG/ML IJ SOLN
INTRAMUSCULAR | Status: AC
  Filled 2011-01-24: qty 1

## 2011-01-24 MED ORDER — TICAGRELOR 90 MG PO TABS
ORAL_TABLET | ORAL | Status: AC
  Administered 2011-01-25: 90 mg via ORAL
  Filled 2011-01-24: qty 2

## 2011-01-24 MED ORDER — SODIUM CHLORIDE 0.9 % IV SOLN: INTRAVENOUS | Status: DC

## 2011-01-24 MED ORDER — MIDAZOLAM HCL 2 MG/2ML IJ SOLN
INTRAMUSCULAR | Status: AC
  Filled 2011-01-24: qty 2

## 2011-01-24 MED ORDER — SODIUM CHLORIDE 0.9 % IJ SOLN: 3.0000 mL | INTRAMUSCULAR | Status: DC | PRN

## 2011-01-24 MED ORDER — HEPARIN SODIUM (PORCINE) 1000 UNIT/ML IJ SOLN
INTRAMUSCULAR | Status: AC
  Filled 2011-01-24: qty 1

## 2011-01-24 MED ORDER — PHENOL 1.4 % MT LIQD
1.0000 | OROMUCOSAL | Status: DC | PRN
  Administered 2011-01-24: 1 via OROMUCOSAL
  Filled 2011-01-24: qty 177

## 2011-01-24 MED ORDER — NITROGLYCERIN 0.2 MG/ML ON CALL CATH LAB
INTRAVENOUS | Status: AC
  Filled 2011-01-24: qty 1

## 2011-01-24 MED ORDER — NITROGLYCERIN IN D5W 200-5 MCG/ML-% IV SOLN
2.0000 ug/min | INTRAVENOUS | Status: DC
  Filled 2011-01-24: qty 250

## 2011-01-24 MED ORDER — FENTANYL CITRATE 0.05 MG/ML IJ SOLN
INTRAMUSCULAR | Status: AC
  Filled 2011-01-24: qty 2

## 2011-01-24 MED ORDER — AMLODIPINE BESYLATE 5 MG PO TABS
5.0000 mg | ORAL_TABLET | Freq: Every day | ORAL | Status: DC
  Administered 2011-01-24 – 2011-01-26 (×2): 5 mg via ORAL
  Filled 2011-01-24 (×3): qty 1

## 2011-01-24 MED ORDER — BIVALIRUDIN 250 MG IV SOLR
INTRAVENOUS | Status: AC
  Filled 2011-01-24: qty 250

## 2011-01-24 MED ORDER — ASPIRIN 81 MG PO CHEW
324.0000 mg | CHEWABLE_TABLET | ORAL | Status: AC
  Administered 2011-01-24: 324 mg via ORAL
  Filled 2011-01-24: qty 4

## 2011-01-24 MED ORDER — ONDANSETRON HCL 4 MG/2ML IJ SOLN: 4.0000 mg | Freq: Four times a day (QID) | INTRAMUSCULAR | Status: DC | PRN

## 2011-01-24 MED ORDER — LIDOCAINE HCL (PF) 1 % IJ SOLN
INTRAMUSCULAR | Status: AC
  Filled 2011-01-24: qty 30

## 2011-01-24 MED ORDER — THERA M PLUS PO TABS
1.0000 | ORAL_TABLET | Freq: Every day | ORAL | Status: DC
  Administered 2011-01-24 – 2011-01-25 (×2): 1 via ORAL
  Filled 2011-01-24 (×3): qty 1

## 2011-01-24 MED ORDER — SODIUM CHLORIDE 0.9 % IV SOLN
INTRAVENOUS | Status: DC
  Administered 2011-01-24: 08:00:00 via INTRAVENOUS

## 2011-01-24 MED ORDER — SODIUM CHLORIDE 0.9 % IV SOLN
INTRAVENOUS | Status: DC
  Administered 2011-01-24: 12:00:00 via INTRAVENOUS

## 2011-01-24 MED ORDER — MORPHINE SULFATE 2 MG/ML IJ SOLN
2.0000 mg | INTRAMUSCULAR | Status: DC | PRN
  Administered 2011-01-24: 2 mg via INTRAVENOUS

## 2011-01-24 MED ORDER — TICAGRELOR 90 MG PO TABS
90.0000 mg | ORAL_TABLET | Freq: Two times a day (BID) | ORAL | Status: DC
  Administered 2011-01-24 – 2011-01-26 (×4): 90 mg via ORAL
  Filled 2011-01-24 (×5): qty 1

## 2011-01-24 MED ORDER — ONDANSETRON HCL 4 MG/2ML IJ SOLN
4.0000 mg | Freq: Four times a day (QID) | INTRAMUSCULAR | Status: DC | PRN
  Administered 2011-01-24: 4 mg via INTRAVENOUS

## 2011-01-24 MED ORDER — ZOLPIDEM TARTRATE 5 MG PO TABS: 5.0000 mg | ORAL_TABLET | Freq: Every evening | ORAL | Status: DC | PRN

## 2011-01-24 MED ORDER — ONDANSETRON HCL 4 MG/2ML IJ SOLN
INTRAMUSCULAR | Status: AC
  Filled 2011-01-24: qty 2

## 2011-01-24 NOTE — Progress Notes (Signed)
At Aproximately 1:45 pm his afternoon in the holding area, pt became vagal during sheath pull.  He became hypotensive requiring transient d/c of IV NTG.. Shortly, thereafter, HR increased to 125, and BP increased to 160/110 and he developed 9/10 chest pain.  ECG revealed ST with RBBB (old), but ST changes anterolaterally.  He was treated with increasing doses of IV NTG up to 50 Micrograms, and received IV Lopressor 5 mg x2.  HR normalized to 75.  Chest pain markeldly improved.  ECG showed T inversion in V5-6.  He was started on iv integrelin and was taken back to the cath lab for PCI of SVG. KELLY,THOMAS A 01/24/2011 3 pm

## 2011-01-24 NOTE — Brief Op Note (Signed)
Diagnostic cardiac catherization note:   SYMEON SCHEID, 65 y.o., male  DICTATION # [id number], [id number]  KELLY,THOMAS A, 01/24/2011, 6:23 PM

## 2011-01-24 NOTE — H&P (Signed)
Patient seen and examined. Agree with assessment and plan as outlined on [name] office note of 01/21/11.  Pt did experience mild recurrent cp this weekend while sweeping.  Plan cardiac catherization and possible PCI Sigmund Morera A 01/24/2011 9:41 AM

## 2011-01-24 NOTE — Progress Notes (Signed)
6 Fr sheath LFA. ACT 130. Sheath to be pulled. Level 0. Right groin. Level 0. Sammie Bench

## 2011-01-24 NOTE — Cardiovascular Report (Signed)
[name], [name]:  [id number]  MEDICAL RECORD NO.:  [id number]  LOCATION:  2501                         FACILITY:  [facility]  PHYSICIAN:  Shelva Majestic, M.D.     DATE OF BIRTH:  02/29/1946  DATE OF PROCEDURE:  01/24/2011 DATE OF DISCHARGE:                           CARDIAC CATHETERIZATION   TIME:  6 p.m.  INDICATIONS:  [name] is a 65 year old gentleman who is a patient of [name] and Dr. Lonell Grandchild.  He has a complex cardiovascular history.  Earlier today, he had undergone diagnostic cardiac catheterization.  Please refer to that catheterization report. Catheterization revealed a patent stent in the left main, evidence for occluded OM vessel at the origin of the circumflex with AV groove circumflex being patent, 80% proximal native right coronary artery with 90% stenosis for an anterior RV marginal branch followed by occlusion of the mid RCA at the marginal takeoff.  There was distal collateralization to the distal right coronary artery both via the native left injection as well as injections via the veins.  He had old occlusion of the graft to right coronary artery.  He had a free RIMA graft to the diagonal vessel which was patent.  He previously had undergone stenting to the vein graft of the circumflex vessel with a 3.0 x 12-mm TAXUS stent at the ostium and then a 3.0 x 20-mm TAXUS stent in the mid portion of the graft.  The patient now had diffuse 50% narrowing in the proximal stent followed by 80% stenosis and then had 95% stenosis in the portion of the vein graft between the 2 stents.  Initially, the plan was for the patient to be hydrated following his diagnostic cardiac catheterization due to dye load and also back discomfort.  He has significant peripheral vascular disease history and also has lumbar disk disease.  It was making it difficult for him to be on the back.  Consequently, the patient was in the holding area after his  diagnostic catheterization. Two hours later after Angiomax had been discontinued, the patient had a prolonged groin hold and developed a vagal response with bradycardia and hypotension.  This then followed with tachycardia with heart rates up to 125 beats per minute with blood pressure elevation at 160/110 since his nitroglycerin had been discontinued while he transiently became hypotensive.  He then developed 9/10 chest pain.  He was treated in the holding area with increasing doses of IV nitroglycerin titrated up to 50 mcg, Lopressor 5 mg IV x2.  Since this sheath had been removed prior to time to restart heparin, he was then given Integrilin.  ECG did show improvement with his heart rate from 125-75, but he did have anterolateral T-wave inversion that was new in V4, V6.  The patient, therefore, is now brought back to the catheterization laboratory for intervention to the vein graft later this afternoon.  PROCEDURE:  Upon arrival back to the catheterization suite, the patient was now pain free.  He had also received 3 of morphine in the holding area in addition to being on 50 mcg of IV nitroglycerin and having received a total of 10 mg of IV Lopressor.  His left groin was prepped and draped.  A 6-French sheath was inserted into the left femoral artery and this was able to be advanced much more easily than early in the morning when the right femoral artery was utilized.  Initially, a 6- [place] left bypass graft guide was used.  However, this resulted in significant catheter dampening with engagement.  This was then removed and exchanged for a 6-French bypass graft guide with side holes.  The patient received 3500 units of heparin and was still on Integrilin drip. ACT was documented to be therapeutic.  Initially, a filter wire was attempted to be advanced down into the vessel.  However, multiple attempts were made, but this was unable to cross the 95%-99% lesion. Consequently, a Prowater  wire was then inserted and this was able to cross the high-grade stenosis and advanced to the distal OM vessel. Dilatation was done initially with a 2.5 x 20-mm Emergent balloon with multiple dilatations made extending from the proximal portion of the mid stent to the ostium.  This was also used to help with sizing and it was felt that at least 40 mm were necessary to cover from the distal aspect of the proximal stent to the proximal portion of the mid stent, more was necessary for stent overlap.  At this point, a 3.0 x 38-mm Promus DES stent was attempted to be inserted, but backup was extremely poor and this was never able to get into the graft with it being hung up so that it touched the ostial stent.  The Luge wire was then inserted for buddy support.  However, this was still not successful when additional attempts were made to pass the stent.  At this point, a review of the old e-chart revealed that at least 17 guiding catheters had been used in the initial attempt when this vessel was stented.  An AL 0.75 guiding catheter was then inserted and used.  Unfortunately, there was not an AL 0.75 guiding catheter with side holes available.  Consequently, this did result in catheter dampening, but due to catheter manipulation, this was able to still be utilized throughout the intervention.  A ChoICE PT wire moderate support was then inserted into the graft.  Another attempt also was made to pass the stent over this, but again this proved unsuccessful.  A Luge was then inserted for buddy wire support with careful manipulation, balancing catheter dampening and coaxial ability. Ultimately, the 3.0 x 38-mm stent was able to be advanced into the graft and was positioned very carefully to just overlap the proximal portion of the previously placed mid TAXUS stent.  However, this proved to be several millimeters still short of overlapping the distal aspect of the proximal stent.  The buddy wire Luge  was then removed with the stent over the ChoICE PT wire.  The stent was then successfully deployed x2 up to 14 atmospheres.  The balloon dilatation catheter was then removed and a 3.0 x 8-mm Promus DES stent was then inserted to cover the gap and to overlap with the previous proximal stent and the stent that had just been inserted.  This was dilated x2 up to 14 atmospheres.  A 3.25 x 21- mm noncompliant Trek balloon was then used for post-stent dilatation. The entire area was dilated up to 3.25 mm.  There was an excellent angiographic result.  There was no evidence for dissection.  There was brisk TIMI-3 flow distally.  The patient tolerated the procedure well. The arterial sheath was  sutured in place with plans for continuation of Integrilin for 18 hours.  The patient did also receive 180 mg of Brilinta during the procedure and his Plavix will be discontinued.  He left the catheterization suite in stable condition with stable hemodynamics, chest pain free.  HEMODYNAMIC DATA:  Central aortic pressure 153/74.  ANGIOGRAPHIC DATA:  Please refer to the diagnostic catheterization from earlier today.  At the start of this intervention, the vein graft which supplies the circumflex marginal vessel seemed to be progressively more narrowed from earlier this morning.  There was now diffuse 50% in-stent restenosis in the proximal previously placed stent followed by an area of 80% stenosis.  In the gap between the proximal and mid previously placed stents, there now appeared to have 95%-99% stenosis.  Following successful PTCA and a very difficult, but ultimately successful interventional procedure with insertion of tandem 3.0 x 30 mm and 3.0 x 8-mm Promus Element DES stents placed in tandem extending from the proximal portion of the remotely placed mid TAXUS stent extending to the proximal TAXUS stent, the entirely stenotic region was reduced to 0%. There was brisk TIMI-3 flow, but there was no  evidence for dissection.  IMPRESSION:  Successful, but difficult complex percutaneous coronary intervention involving long segmental stenosis in the vein graft supplying the circumflex marginal vessel.  The graft which had previously been stented proximally with a 3.0 x 12-mm TAXUS stent in its mid segment 3.0 x 20-mm TAXUS stent, now with additional tandem stents of 3.0 x 38 mm and 3.0 x 8 mm placed with the segmental 50, 80, and 99% stenoses being reduced to 0%.  Integrilin/heparin/Brilinta/IC and IV nitroglycerin.          ______________________________ Shelva Majestic, M.D.     TK/MEDQ  D:  01/24/2011  T:  01/24/2011  Job:  [id number]  cc:   Delfino Lovett A. Rollene Fare, M.D. Dr. Alexandria Lodge

## 2011-01-24 NOTE — Brief Op Note (Signed)
PCI NOTE  Difficult but successful urgent PCI/stent to SVG to OM of LCX.  [name], 65 y.o., male  DICTATION # [id number], [id number]  KELLY,THOMAS A, 01/24/2011, 6:35 PM

## 2011-01-24 NOTE — Cardiovascular Report (Signed)
[name], [name] NO.:  [id number]  MEDICAL RECORD NO.:  [id number]  LOCATION:  2501                         FACILITY:  [facility]  PHYSICIAN:  Shelva Majestic, M.D.     DATE OF BIRTH:  1945/12/13  DATE OF PROCEDURE:  01/24/2011 DATE OF DISCHARGE:                           CARDIAC CATHETERIZATION   INDICATIONS:  [name] is a 65 year old gentleman who has a history of complex cardiovascular disease.  He apparently had initially undergone CABG revascularization surgery in [place] in 1984.  In 1996, he required redo CABG revascularization surgery after all grafts were found to be occluded.  He does have significant peripheral vascular disease and underwent right carotid endarterectomy at that time.  In 1998, he apparently underwent stenting of his left main coronary artery. He also has had stenting done to the vein graft supplying the marginal vessel.  The patient has a history of right renal artery stenosis and underwent initial stenting with re-dilatation in 2005.  He has documented chronic right bundle branch block.  He has significant peripheral vascular disease with bilateral SFA occlusion.  He has also known left subclavian artery occlusion with right subclavian stenosis. There was a remote tobacco history, history of hyperlipidemia as well as a history of hypertension.  Apparently, recently, he developed progressive episodes of chest pain.  He was seen by Dr. Rollene Fare on January 21, 2011, in the light of increasing symptomatology and repeat catheterization was recommended.  PROCEDURE:  After premedication with Versed 1 mg plus fentanyl 25 mcg initially, the patient was prepped and draped in usual fashion.  His right femoral artery was punctured anteriorly and a 5-French sheath was inserted.  A Glidewire was necessary to navigate up the iliac system. Diagnostic catheterization was done utilizing 5-French Judkins 4 left and right coronary catheters  into the native coronary arteries.  The right catheter was also used for selective angiography into the free RIMA graft which supplies the diagonal vessel, as well as in the vein graft which supplies a marginal vessel.  A right bypass graft catheter was necessary for selective angiography into the vein graft which had supplied the right coronary artery which was occluded.  This was also used and the left subclavian artery was again documented to be totally occluded.  A pigtail catheter was then inserted and biplane cine left ventriculography was performed.  Distal aortography was performed to evaluate his peripheral vascular disease.  At this time, I broke scrub. I reviewed the angiographic results with Dr. Rollene Fare by telephone.  I discussed options with the patient.  At this point, with the demonstration of high-grade graft disease in the graft supplying the marginal vessel with in-stent narrowing proximally followed by 95-99% stenosis in the proximal third of the graft before the mid placed stent, the decision was made to attempt potential intervention.  At this time, the patient had already received approximately 200 mL of contrast.  He also has significant lumbar back disease.  He was starting to complain of some back discomfort.  Initially, the patient preferred that attempt performing the intervention at this setting.  At this time, the right femoral artery 5-French sheath was upgraded  to a 6-French sheath. Bivalirudin was administered in anticipation of his intervention. However, it became very difficult for the Glidewire to be passed up the iliac system without the wire folding on itself.  Initially, a 6-French left bypass guide catheter was used for support, then this was changed to the diagnostic right catheter.  During this time, the patient continued to experience more back discomfort which he felt was due to his lumbar back disease.  He was not having chest pain.   Multiple additional attempts were made to pass the Glidewire up into the iliac system.  At this point, the decision was made in light of the patient's contrast load, his back discomfort, and potential prolonged additional time to do the intervention, it was decided to not perform the intervention at this setting with ultimate plans for stabilization, hydration, and stage intervention.  Consequently, the intervention was not done at this setting.  The patient left the catheterization laboratory with stable hemodynamics and painfree.  HEMODYNAMIC DATA:  Central aortic pressure was 176/75, left ventricular pressure 176/17, post A-wave 27.  ANGIOGRAPHIC DATA:  Left main coronary artery had a previously placed stent, which seemed to go beyond the circumflex origin, however, it was difficult to see beyond this to exactly where the stent stopped.  The stent however, was patent.  The remaining portion of the LAD was free of significant disease and extended to the apex.  It gave rise to diagonal septal perforating arteries.  The circumflex vessel visualized only in the AV groove portion, and the marginal vessel was not visualized, which had been supplied by the graft.  The native right coronary artery had ostial 80% stenosis extending proximally, and there was 90% stenosis before giving rise to an anterior RV marginal branch.  Just after the anterior RV marginal branch, the RCA was occluded.  There was extensive collateralization to the distal RCA via the left system both via the native injection and also via the grafts.  The most superior graft was the graft which had supplied the marginal vessel.  There were 2 stents placed in this graft, 1 stent proximally which is a 3.0 x12 mm Taxus stent and an additional 3.0 x 20 mm PROMUS stent in the mid portion.  There was in-stent narrowing in the proximal stent of approximately 50%.  There was 95% body stenosis in the portion between the 2  stents.  This graft supplied the obtuse marginal vessel. Distal to the anastomosis was free of significant disease.  The free RIMA graft was tortuous but patent and supplied the diagonal vessel.  The graft which had previously supplied the right coronary artery was totally occluded at its origin.  Selective angiography of the proximal subclavian artery revealed this to be totally occluded shortly after arising from the aorta.  Biplane cine ventriculography revealed mild LV dysfunction with mild anterolateral hypocontractility and severe hypo to akinesis in the mid to basal inferior wall on the RAO projection and on the LAO projection, there was a moderate hypocontractility involving the low to mid posterolateral wall.  Ejection fraction was approximately 45%.  Distal aortography revealed a stent present in the right renal artery, however, visualization was suboptimal.  There did appear to be diffuse narrowing in the right iliac artery.  IMPRESSION: 1. Significant native coronary artery disease with evidence for a     patent left main stent apparently inserted in 1998 by Dr.     Rollene Fare. 2. Occluded circumflex marginal vessels from the atrioventricular  groove circumflex. 3. High-grade 80% ostial right coronary artery stenosis with 90%     stenosis in the mid right coronary artery proximal to the right     ventricular marginal branch and total occlusion of the mid right     coronary artery. 4. Occluded vein graft supplying the right coronary artery, but     evidence for collateralization to the distal right coronary artery     via the left coronary injection and the injection of the grafts. 5. Vein graft supplying the circumflex marginal vessel with previously     placed ostial proximal 3.0 x 12 mm Taxus stent with in-stent     narrowing of 50%, followed by 95% stenosis in the body of the     proximal third of the vessel graft prior to the previously placed     mid graft  3.0 x 20 mm Taxus stent. 6. Free right internal mammary artery graft supplying a diagonal     vessel. 7. Occluded left subclavian artery. 8. Right renal artery stent question patency with evidence for diffuse     right iliac stenosis.  The patient will be hydrated following the     catheterizations with plans for heparinization, increased nitrate     therapy, increased medical therapy and planned percutaneous     coronary intervention to the vein graft to the circumflex vessel.          ______________________________ Shelva Majestic, M.D.     TK/MEDQ  D:  01/24/2011  T:  01/24/2011  Job:  [id number]  cc:   Delfino Lovett A. Rollene Fare, M.D. Dr. Reesa Chew

## 2011-01-24 NOTE — Progress Notes (Signed)
Received patient from Cath Lab. 6 Fr sheath in LFA. Level 0. Right groin level 0. Denies CP or any discomforts. 100% Sp02 on 2L/min O2 [place].  RR 12. HR 69. BP 126/67. Integrelin gtt at 57mcg/kg/min. NTG gtt at 47mcg/min. Lungs CTA. Sammie Bench

## 2011-01-25 ENCOUNTER — Encounter (HOSPITAL_COMMUNITY): Payer: Self-pay

## 2011-01-25 ENCOUNTER — Other Ambulatory Visit: Payer: Self-pay

## 2011-01-25 DIAGNOSIS — Z95828 Presence of other vascular implants and grafts: Secondary | ICD-10-CM

## 2011-01-25 DIAGNOSIS — I200 Unstable angina: Secondary | ICD-10-CM | POA: Diagnosis present

## 2011-01-25 LAB — CBC
HCT: 26 % — ABNORMAL LOW (ref 39.0–52.0)
MCH: 32.6 pg (ref 26.0–34.0)
MCV: 93.2 fL (ref 78.0–100.0)
RBC: 2.79 MIL/uL — ABNORMAL LOW (ref 4.22–5.81)
WBC: 6.8 10*3/uL (ref 4.0–10.5)

## 2011-01-25 LAB — BASIC METABOLIC PANEL
BUN: 14 mg/dL (ref 6–23)
CO2: 23 mEq/L (ref 19–32)
Calcium: 8 mg/dL — ABNORMAL LOW (ref 8.4–10.5)
Chloride: 106 mEq/L (ref 96–112)
Creatinine, Ser: 0.97 mg/dL (ref 0.50–1.35)
Glucose, Bld: 129 mg/dL — ABNORMAL HIGH (ref 70–99)

## 2011-01-25 LAB — CARDIAC PANEL(CRET KIN+CKTOT+MB+TROPI)
CK, MB: 4.7 ng/mL — ABNORMAL HIGH (ref 0.3–4.0)
Relative Index: 3.8 — ABNORMAL HIGH (ref 0.0–2.5)
Relative Index: 2.7 — ABNORMAL HIGH (ref 0.0–2.5)
Total CK: 148 U/L (ref 7–232)
Troponin I: 0.54 ng/mL (ref <0.30)
Troponin I: 0.43 ng/mL (ref <0.30)

## 2011-01-25 MED ORDER — GUAIFENESIN-DM 100-10 MG/5ML PO SYRP: 15.0000 mL | ORAL_SOLUTION | ORAL | Status: DC | PRN

## 2011-01-25 MED ORDER — MAGNESIUM HYDROXIDE 400 MG/5ML PO SUSP: 30.0000 mL | Freq: Every day | ORAL | Status: DC | PRN

## 2011-01-25 MED ORDER — PANTOPRAZOLE SODIUM 40 MG PO TBEC
40.0000 mg | DELAYED_RELEASE_TABLET | Freq: Two times a day (BID) | ORAL | Status: DC
  Administered 2011-01-25 – 2011-01-26 (×3): 40 mg via ORAL
  Filled 2011-01-25 (×4): qty 1

## 2011-01-25 MED ORDER — ALUM & MAG HYDROXIDE-SIMETH 200-200-20 MG/5ML PO SUSP
15.0000 mL | ORAL | Status: DC | PRN
  Administered 2011-01-25 (×2): 30 mL via ORAL
  Filled 2011-01-25 (×2): qty 30

## 2011-01-25 MED ORDER — SODIUM CHLORIDE 0.9 % IV SOLN
Freq: Once | INTRAVENOUS | Status: AC
  Administered 2011-01-25: 250 mL via INTRAVENOUS

## 2011-01-25 MED ORDER — ATENOLOL 25 MG PO TABS
25.0000 mg | ORAL_TABLET | Freq: Two times a day (BID) | ORAL | Status: DC
  Administered 2011-01-25 – 2011-01-26 (×2): 25 mg via ORAL
  Filled 2011-01-25 (×4): qty 1

## 2011-01-25 MED FILL — Dextrose Inj 5%: INTRAVENOUS | Qty: 50 | Status: AC

## 2011-01-25 NOTE — Progress Notes (Signed)
UR Completed.   [name] Jane 01/25/2011  

## 2011-01-25 NOTE — Progress Notes (Signed)
Subjective: "I do feel dizzy a little, no chest pain."  Also he c/o'd of epigastric pain and headache.  Objective: Vital signs in last 24 hours:  Temp:  [97.9 F (36.6 C)-98.1 F (36.7 C)] 97.9 F (36.6 C) (11/20 0353) Pulse Rate:  [59-77] 72  (11/20 0353) Resp:  [12-17] 17  (11/20 0353) BP: (96-115)/(50-71) 97/55 mmHg (11/20 0353) SpO2:  [98 %-100 %] 100 % (11/20 0353) Weight:  [66 kg (145 lb 8.1 oz)] 145 lb 8.1 oz (66 kg) (11/20 0000) Weight change:  Last BM Date: 01/23/11 Intake/Output from previous day:  -500 11/19 0701 - 11/20 0700 In: -  Out: 1500 [Urine:1500] Intake/Output this shift:    PE: GENERAL:  Alert, oriented X3.  Pale but [name] & d LUNGS:  Clear without rales, rhonchi or wheezes. ABD: soft, + bs, non tender except epigastric region. No masses palpated. EXT: groins at cath sites without hematoma, sl. Oozing from lt. Groin. Lt. Foot with mild edema.  Pedal pulses + with doppler.     Lab Results:  [facility] 01/25/11 0535  WBC 6.8  HGB 9.1*  HCT 26.0*  PLT 127*   BMET  Basename 01/25/11 0535  NA 135  K 3.7  CL 106  CO2 23  GLUCOSE 129*  BUN 14  CREATININE 0.97  CALCIUM 8.0*   No results found for this basename: TROPONINI:2,CK,MB:2 in the last 72 hours  No results found for this basename: CHOL, HDL, LDLCALC, LDLDIRECT, TRIG, CHOLHDL   No results found for this basename: HGBA1C     No results found for this basename: TSH    Hepatic Function Panel No results found for this basename: PROT,ALBUMIN,AST,ALT,ALKPHOS,BILITOT,BILIDIR,IBILI in the last 72 hours No results found for this basename: CHOL in the last 72 hours No results found for this basename: PROTIME in the last 72 hours    EKG: Orders placed during the hospital encounter of 01/24/11  . EKG 12-LEAD  . EKG 12-LEAD    Studies/Results: No results found.  Medications: I have reviewed the patient's current medications.  Assessment/Plan: Patient Active Problem List  Diagnoses  .  HYPERLIPIDEMIA  . HYPERTENSION  . MYOCARDIAL INFARCTION, HX OF  . CORONARY ARTERY DISEASE  . HEMORRHOIDS  . EROSIVE ESOPHAGITIS  . ESOPHAGEAL STRICTURE  . GASTROESOPHAGEAL REFLUX DISEASE  . PEPTIC STRICTURE  . INGUINAL HERNIA  . CONSTIPATION  . NAUSEA  . FLATULENCE-GAS-BLOATING  . ABDOMINAL PAIN, GENERALIZED  . ABDOMINAL PAIN-MULTIPLE SITES  . Crescendo angina  . Presence of stent of bypass graft   PLAN:  Pt electively brought in for cardiac cath for crescendo angina.Orignial plan was to do staged intervention but S/P cath pt. Had BP drop which lead to d/c NTG,  THEN pt developed significant chest pain and hypertension-went back to the cath lab & underwent PTCA/STENT DES X 2 to the VG to the LCX.  This am after walking, BP down to 85 syst. . H/H also with 4 gm drop.  D/C NTG, Integrelin check H/H now. hemoccult stools. No obvious source of blood loss on exam.    May transfer to tele once labs are back.   Will keep in hosp. Today to monitor plan d/c home tomorrow.  LOS: 1 day   INGOLD,[name] [name] 01/25/2011, 8:32 AM  I have seen and examined the patient along with [name] R, NP.  I have reviewed the chart, notes and new data.  I agree with [name]'s note.  Key new complaints: Hypotension overnite / this AM. No chest  pain after long SVG PCI procedure Key examination changes: Agree with above Key new findings / data: H/H drop as noted above  PLAN: Agree with plan to monitor o/n and follow H/H. If stable in AM, anticipate d/c - to f/u with Dr. Corky Downs  [name] [name] 01/25/2011, 5:35 PM

## 2011-01-25 NOTE — Progress Notes (Signed)
Cardiac Rehab 1450 - 1530 Pt education complete. Ok to refer to cardiac rehab @ [place].   [name]

## 2011-01-25 NOTE — Progress Notes (Signed)
Notified PA Cecilie Kicks for positive AM troponin she has ordered more labs for to continue to follow. Pt at this time denies any chest pain and is stable will continue to monitor.

## 2011-01-25 NOTE — Progress Notes (Signed)
CARDIAC REHAB PHASE I   PRE:  Rate/Rhythm: 72 sr  BP:  Supine:   Sitting: 95/47  Standing:    SaO2: 97 % ra  MODE:  Ambulation: 340 ft   POST:  Rate/Rhythem: 81  BP:  Supine:   Sitting: 86/40  Standing:    SaO2: 100 % ra  Pt ambulated unit with walker steady c/o chronic back pain. Pt states he goes to out pt rehab at [facility] and would like to return there when released by MD to do so.   [name]

## 2011-01-25 NOTE — Progress Notes (Signed)
Site area: right groin  Site Prior to Removal:  Level 0  Pressure Applied For 25 minutes  Minutes Beginning at 1945  Manual:   yes  Patient Status During Pull:  stable  Post Pull Groin Site:  Level 0  Post Pull Instructions Given:  yes  Post Pull Pulses Present:  yes  Dressing Applied:  yes    Comments:  Patient tolerated procedure well. Sammie Bench

## 2011-01-26 ENCOUNTER — Encounter (HOSPITAL_COMMUNITY)
Admission: RE | Disposition: A | Discharge status: Home / Self Care | Payer: Self-pay | Source: Ambulatory Visit | Attending: Cardiovascular Disease

## 2011-01-26 DIAGNOSIS — D649 Anemia, unspecified: Secondary | ICD-10-CM | POA: Diagnosis present

## 2011-01-26 DIAGNOSIS — I452 Bifascicular block: Secondary | ICD-10-CM

## 2011-01-26 DIAGNOSIS — I739 Peripheral vascular disease, unspecified: Secondary | ICD-10-CM | POA: Diagnosis present

## 2011-01-26 LAB — BASIC METABOLIC PANEL
CO2: 26 mEq/L (ref 19–32)
Chloride: 105 mEq/L (ref 96–112)
Creatinine, Ser: 1.25 mg/dL (ref 0.50–1.35)
GFR calc Af Amer: 68 mL/min — ABNORMAL LOW (ref >90)
Potassium: 4.4 mEq/L (ref 3.5–5.1)

## 2011-01-26 LAB — CBC
HCT: 28 % — ABNORMAL LOW (ref 39.0–52.0)
Hemoglobin: 9.8 g/dL — ABNORMAL LOW (ref 13.0–17.0)
MCV: 94 fL (ref 78.0–100.0)
RDW: 12.5 % (ref 11.5–15.5)
WBC: 7.4 10*3/uL (ref 4.0–10.5)

## 2011-01-26 LAB — CARDIAC PANEL(CRET KIN+CKTOT+MB+TROPI): Relative Index: 2.1 (ref 0.0–2.5)

## 2011-01-26 SURGERY — PERCUTANEOUS CORONARY STENT INTERVENTION (PCI-S)
Anesthesia: LOCAL

## 2011-01-26 MED ORDER — TICAGRELOR 90 MG PO TABS: 90.0000 mg | ORAL_TABLET | Freq: Two times a day (BID) | ORAL | Status: DC

## 2011-01-26 MED ORDER — ACETAMINOPHEN 325 MG PO TABS: 650.0000 mg | ORAL_TABLET | ORAL | Status: AC | PRN

## 2011-01-26 MED ORDER — NITROGLYCERIN 0.4 MG SL SUBL: 0.4000 mg | SUBLINGUAL_TABLET | SUBLINGUAL | Status: DC | PRN

## 2011-01-26 MED ORDER — ROSUVASTATIN CALCIUM 40 MG PO TABS: 40.0000 mg | ORAL_TABLET | Freq: Every day | ORAL | Status: DC

## 2011-01-26 MED ORDER — ASPIRIN 81 MG PO TBEC: 81.0000 mg | DELAYED_RELEASE_TABLET | Freq: Every day | ORAL | Status: AC

## 2011-01-26 NOTE — Progress Notes (Signed)
CARDIAC REHAB PHASE I   PRE:  Rate/Rhythm: SB, IVCD 54  BP:  Supine:   Sitting: 118/60  Standing:    SaO2: 98%  MODE:  Ambulation: 550 ft   POST:  Rate/Rhythem: 56 SB  BP:  Supine:   Sitting: 100/64  Standing:    SaO2: 99%  Pt ambulated without difficulty.  Steady gait.  Denies cp or dyspnea.  Pt education reinforced.  Pt requests to continue outpatient cardiac rehab at [facility].  Referral will be sent.   [id number]  Carolyne Littles

## 2011-01-26 NOTE — Progress Notes (Signed)
Discharge instructions, prescriptions, and follow up appointments reviewed with patient and wife, no questions or concerns.  Patient and wife discharged with 2 bags of belongings to short stay with NT Alice via wheelchair. [name]

## 2011-01-26 NOTE — Discharge Summary (Signed)
Patient ID: [name],  MRN: [id number], DOB/AGE: 09-15-1945 65 y.o.  Admit date: 01/24/2011 Discharge date: 01/26/2011   Primary Cardiologist: Dr Rollene Fare  Discharge Diagnoses Principal Problem:  *Crescendo angina Active Problems:  CORONARY ARTERY DISEASE  Placement of stent to SVG -OM  bypass graft this admission  HYPERLIPIDEMIA  HYPERTENSION  Anemia  PVD (peripheral vascular disease)  RBBB (right bundle branch block with left anterior fascicular block)    Procedures Cardiac Cath 01/24/2011                        PCI 01/24/2011   Hospital Course [name] is a 65 year old male known to Dr. Rollene Fare with a long history of complicated coronary disease. He's had previous bypass grafting in [place] in 1982. He had a redo bypass graft because all his grafts were occluded in 1996. At that time he also had a right carotid endarterectomy. He's had multiple percutaneous coronary interventions. The left main and LAD  Were stented in 1998. He had an RCA and LAD stent in October 2005. Recently he was seen as an outpatient and has been having accelerating angina. He was set up for admission and diagnostic catheterization. This was done by Dr. Claiborne Billings on 01/24/2011. Apparently the catheterization showed disease in the vein graft to the circumflex. I believe the initial plan had been for medical therapy but later in the day the patient became unstable with recurrent chest pain and EKG changes and was taken back to the cath lab for urgent intervention to the SVG to OM. Please see [name] Note for complete details. Patient tolerated this well with only a minor elevation in his troponins. He was transfered to telemetry we feel he can be discharged 01/26/2011. He'll follow up with Dr. Rollene Fare as an outpatient.  Discharge Vitals:  Blood pressure 105/61, pulse 63, temperature 98 F (36.7 C), temperature source Oral, resp. rate 18, height 5\' 5"  (1.651 m), weight 66 kg (145 lb 8.1 oz), SpO2  99.00%.    Labs: Results for orders placed during the hospital encounter of 01/24/11 (from the past 48 hour(s))  CBC     Status: Abnormal   Collection Time   01/25/11  5:35 AM      Component Value Range Comment   WBC 6.8  4.0 - 10.5 (K/uL)    RBC 2.79 (*) 4.22 - 5.81 (MIL/uL)    Hemoglobin 9.1 (*) 13.0 - 17.0 (g/dL)    HCT 26.0 (*) 39.0 - 52.0 (%)    MCV 93.2  78.0 - 100.0 (fL)    MCH 32.6  26.0 - 34.0 (pg)    MCHC 35.0  30.0 - 36.0 (g/dL)    RDW 12.3  11.5 - 15.5 (%)    Platelets 127 (*) 150 - 400 (K/uL)   BASIC METABOLIC PANEL     Status: Abnormal   Collection Time   01/25/11  5:35 AM      Component Value Range Comment   Sodium 135  135 - 145 (mEq/L)    Potassium 3.7  3.5 - 5.1 (mEq/L)    Chloride 106  96 - 112 (mEq/L)    CO2 23  19 - 32 (mEq/L)    Glucose, Bld 129 (*) 70 - 99 (mg/dL)    BUN 14  6 - 23 (mg/dL)    Creatinine, Ser 0.97  0.50 - 1.35 (mg/dL)    Calcium 8.0 (*) 8.4 - 10.5 (mg/dL)    GFR calc non Af  Amer 84 (*) >90 (mL/min)    GFR calc Af Amer >90  >90 (mL/min)   HEMOGLOBIN AND HEMATOCRIT, BLOOD     Status: Abnormal   Collection Time   01/25/11  8:49 AM      Component Value Range Comment   Hemoglobin 10.1 (*) 13.0 - 17.0 (g/dL)    HCT 28.5 (*) 39.0 - 52.0 (%)   CARDIAC PANEL(CRET KIN+CKTOT+MB+TROPI)     Status: Abnormal   Collection Time   01/25/11  8:49 AM      Component Value Range Comment   Total CK 124  7 - 232 (U/L)    CK, MB 4.7 (*) 0.3 - 4.0 (ng/mL)    Troponin I 0.54 (*) <0.30 (ng/mL)    Relative Index 3.8 (*) 0.0 - 2.5    CARDIAC PANEL(CRET KIN+CKTOT+MB+TROPI)     Status: Abnormal   Collection Time   01/25/11  3:45 PM      Component Value Range Comment   Total CK 148  7 - 232 (U/L)    CK, MB 4.0  0.3 - 4.0 (ng/mL)    Troponin I 0.43 (*) <0.30 (ng/mL)    Relative Index 2.7 (*) 0.0 - 2.5    CARDIAC PANEL(CRET KIN+CKTOT+MB+TROPI)     Status: Abnormal   Collection Time   01/25/11 11:56 PM      Component Value Range Comment   Total CK 179  7  - 232 (U/L)    CK, MB 3.8  0.3 - 4.0 (ng/mL)    Troponin I 0.39 (*) <0.30 (ng/mL)    Relative Index 2.1  0.0 - 2.5    BASIC METABOLIC PANEL     Status: Abnormal   Collection Time   01/25/11 11:57 PM      Component Value Range Comment   Sodium 137  135 - 145 (mEq/L)    Potassium 4.4  3.5 - 5.1 (mEq/L)    Chloride 105  96 - 112 (mEq/L)    CO2 26  19 - 32 (mEq/L)    Glucose, Bld 97  70 - 99 (mg/dL)    BUN 16  6 - 23 (mg/dL)    Creatinine, Ser 1.25  0.50 - 1.35 (mg/dL)    Calcium 8.4  8.4 - 10.5 (mg/dL)    GFR calc non Af Amer 58 (*) >90 (mL/min)    GFR calc Af Amer 68 (*) >90 (mL/min)   CBC     Status: Abnormal   Collection Time   01/25/11 11:57 PM      Component Value Range Comment   WBC 7.4  4.0 - 10.5 (K/uL)    RBC 2.98 (*) 4.22 - 5.81 (MIL/uL)    Hemoglobin 9.8 (*) 13.0 - 17.0 (g/dL)    HCT 28.0 (*) 39.0 - 52.0 (%)    MCV 94.0  78.0 - 100.0 (fL)    MCH 32.9  26.0 - 34.0 (pg)    MCHC 35.0  30.0 - 36.0 (g/dL)    RDW 12.5  11.5 - 15.5 (%)    Platelets 120 (*) 150 - 400 (K/uL)     Disposition:  Follow-up Information    Follow up with Rebecca Eaton, MD. (office will call)    Contact information:   [place] [place] [address] [place] [phone]          Discharge Medications:  Current Discharge Medication List    START taking these medications   Details  acetaminophen (TYLENOL) 325 MG  tablet Take 2 tablets (650 mg total) by mouth every 4 (four) hours as needed. Qty: 30 tablet    aspirin EC 81 MG EC tablet Take 1 tablet (81 mg total) by mouth daily.    nitroGLYCERIN (NITROSTAT) 0.4 MG SL tablet Place 1 tablet (0.4 mg total) under the tongue every 5 (five) minutes as needed for chest pain. Qty: 25 tablet, Refills: 2    rosuvastatin (CRESTOR) 40 MG tablet Take 1 tablet (40 mg total) by mouth daily at 6 PM. Qty: 30 tablet, Refills: 5    Ticagrelor (BRILINTA) 90 MG TABS tablet Take 1 tablet (90 mg total) by mouth 2 (two)  times daily. Qty: 60 tablet, Refills: 5      CONTINUE these medications which have NOT CHANGED   Details  amLODipine (NORVASC) 2.5 MG tablet Take 2.5 mg by mouth 2 (two) times daily.      atenolol (TENORMIN) 25 MG tablet Take 25 mg by mouth 2 (two) times daily.      butalbital-aspirin-caffeine (FIORINAL) 50-325-40 MG per capsule Take 1 capsule by mouth every 4 (four) hours as needed. FOR HEADACHE.     esomeprazole (NEXIUM) 40 MG capsule Take 40 mg by mouth 2 (two) times daily.      isosorbide mononitrate (IMDUR) 30 MG 24 hr tablet Take 30 mg by mouth daily.      Multiple Vitamins-Minerals (MULTIVITAMINS THER. W/MINERALS) TABS Take 1 tablet by mouth daily.      vitamin E 200 UNIT capsule Take 200 Units by mouth daily.        STOP taking these medications     clopidogrel (PLAVIX) 75 MG tablet      lovastatin (MEVACOR) 40 MG tablet          Duration of Discharge Encounter: Greater than 30 minutes including physician time.  Angelena Form PA-C 01/26/2011 4:11 PM

## 2011-01-26 NOTE — Progress Notes (Signed)
Patient ID: [name], male   DOB: 05-19-1945, 65 y.o.   MRN: [id number]  [facility]  Subjective: Feeling better.  Objective: Vital signs in last 24 hours: Temp:  [98.3 F (36.8 C)-99.3 F (37.4 C)] 98.3 F (36.8 C) (11/21 [id number]) Pulse Rate:  [64-77] 64  (11/21 0633) Resp:  [15-20] 15  (11/21 [id number]) BP: (90-114)/(41-60) 99/46 mmHg (11/21 0633) SpO2:  [97 %-100 %] 97 % (11/21 [id number]) Last BM Date: 01/23/11  Intake/Output from previous day: 11/20 0701 - 11/21 0700 In: 600 [P.O.:600] Out: 1050 [Urine:1050] Intake/Output this shift:    Medications Current Facility-Administered Medications  Medication Dose Route Frequency Provider Last Rate Last Dose  . acetaminophen (TYLENOL) tablet 650 mg  650 mg Oral Q4H PRN Troy Sine, MD      . alum & mag hydroxide-simeth (MAALOX/MYLANTA) 200-200-20 MG/5ML suspension 15-30 mL  15-30 mL Oral Q2H PRN Troy Sine, MD   30 mL at 01/25/11 1756  . amLODipine (NORVASC) tablet 5 mg  5 mg Oral Daily Isaiah Serge, NP   5 mg at 01/24/11 2157  . aspirin EC tablet 81 mg  81 mg Oral Daily Troy Sine, MD   81 mg at 01/25/11 1000  . atenolol (TENORMIN) tablet 25 mg  25 mg Oral BID Isaiah Serge, NP   25 mg at 01/25/11 2149  . butalbital-acetaminophen-caffeine (FIORICET, ESGIC) 50-325-40 MG per tablet 1 tablet  1 tablet Oral Q4H PRN Sandford Craze, PHARMD   1 tablet at 01/24/11 2307  . guaiFENesin-dextromethorphan (ROBITUSSIN DM) 100-10 MG/5ML syrup 15 mL  15 mL Oral Q4H PRN Troy Sine, MD      . magnesium hydroxide (MILK OF MAGNESIA) suspension 30 mL  30 mL Oral Daily PRN Troy Sine, MD      . morphine 2 MG/ML injection 2 mg  2 mg Intravenous Q2H PRN Troy Sine, MD   2 mg at 01/24/11 1345  . multivitamins ther. w/minerals tablet 1 tablet  1 tablet Oral Daily Troy Sine, MD   1 tablet at 01/25/11 1003  . multivitamins ther. w/minerals tablet 1 tablet  1 tablet Oral Daily Troy Sine, MD   1  tablet at 01/25/11 1001  . nitroGLYCERIN 0.2 mg/mL in dextrose 5 % infusion  2-200 mcg/min Intravenous Continuous Troy Sine, MD      . ondansetron [facility]) injection 4 mg  4 mg Intravenous Q6H PRN Troy Sine, MD      . pantoprazole (PROTONIX) EC tablet 40 mg  40 mg Oral BID AC Isaiah Serge, NP   40 mg at 01/26/11 [id number]  . phenol (CHLORASEPTIC) mouth spray 1 spray  1 spray Mouth/Throat PRN Laverle Hobby, PA   1 spray at 01/24/11 2309  . rosuvastatin (CRESTOR) tablet 40 mg  40 mg Oral q1800 Troy Sine, MD   40 mg at 01/25/11 1756  . Ticagrelor (BRILINTA) tablet 90 mg  90 mg Oral BID Troy Sine, MD   90 mg at 01/25/11 2149  . zolpidem (AMBIEN) tablet 5 mg  5 mg Oral QHS PRN Troy Sine, MD      . DISCONTD: 0.9 %  sodium chloride infusion  1 mL/kg/hr Intravenous Continuous Troy Sine, MD 62.6 mL/hr at 01/24/11 2010 1 mL/kg/hr at 01/24/11 2010    PE: General appearance: alert, cooperative and no distress Lungs: rhonchi RLL Heart: regular rate and rhythm, S1, S2 normal, no  murmur, click, rub or gallop Extremities: No edema Pulses: 2+ radials  Lab Results:   Basename 01/25/11 2357 01/25/11 0849 01/25/11 0535  WBC 7.4 -- 6.8  HGB 9.8* 10.1* 9.1*  HCT 28.0* 28.5* 26.0*  PLT 120* -- 127*   BMET  Basename 01/25/11 2357 01/25/11 0535  NA 137 135  K 4.4 3.7  CL 105 106  CO2 26 23  GLUCOSE 97 129*  BUN 16 14  CREATININE 1.25 0.97  CALCIUM 8.4 8.0*   PT/INR  Basename 01/24/11 0805  LABPROT 14.2  INR 1.08   Cholesterol No results found for this basename: CHOL in the last 72 hours Cardiac Enzymes No components found with this basename: TROPONIN:3, CKMB:3  TELE: SR 69 bpm.   Assessment/Plan  Principal Problem:   NSTEMI:  Peak Troponin 0.54   Active Problems:  Hypotension   Anemia  CORONARY ARTERY DISEASE   S/P stent to SVG  Presence of stent of bypass graft  Plan:  BP improved.  It is still a little soft, however, baseline SBP around 104 mmHg.    Hgb improved.  ASA, ticagrelor, crestor, atenolol.  No room for ACE.   Likely home today with follow up Hgb.  Also due for Endoscopy with Dr. Henrene Pastor.  The Pt was supposed to have it last February.   LOS: 2 days    HAGER,BRYAN W 01/26/2011 9:05 AM Patient seen and examined. Agree with assessment and plan. Feels much better post PCI.  Will start ACE-I/ARB as outpt as BP allows. KELLY,THOMAS A 01/26/2011 2:47 PM

## 2011-01-26 NOTE — Progress Notes (Signed)
   CARE MANAGEMENT NOTE 01/26/2011  Patient:  [name], [name]   Account Number:  [id number]  Date Initiated:  01/25/2011  Documentation initiated by:  [facility]  Subjective/Objective Assessment:   Post planned cath - hypotension - EKG with changes - urgent PCI     Action/Plan:   Anticipated DC Date:  01/28/2011   Anticipated DC Plan:  [place]  CM consult      Choice offered to / List presented to:             Status of service:  Completed, signed off Medicare Important Message given?   (If response is "NO", the following Medicare IM given date fields will be blank) Date Medicare IM given:   Date Additional Medicare IM given:    Discharge Disposition:  HOME/SELF CARE  Per UR Regulation:  Reviewed for med. necessity/level of care/duration of stay  Comments:  01-26-11 1645 Jacqlyn Krauss, RN,BSN [phone] CM spoke to pt and provided him with 30 day free card and [facility] [place]. They can order medications, however he will have to purchase the medication at [facility] on [address] before leaving [place]. 01-25-11 [place] [facility] Completed.

## 2011-01-31 ENCOUNTER — Telehealth: Payer: Self-pay | Admitting: Internal Medicine

## 2011-01-31 NOTE — Telephone Encounter (Signed)
IS HE HAVING DYSPHAGIA? PUT CHART ON MY DESK. I WILL LET YOU KNOW THIS WEEK

## 2011-01-31 NOTE — Telephone Encounter (Signed)
Pt is not having problems swallowing. He is only complaining of stomach discomfort right behind his navel. Chart ordered for Dr. Henrene Pastor to review.

## 2011-01-31 NOTE — Telephone Encounter (Signed)
Pt was supposed to have EGD in Feb but had to cancel the appt. Pt states he spoke with Dr. Henrene Pastor in Oct when his wife was having a procedure and Dr. Henrene Pastor had him call the office to see about rescheduling the procedure. Pt reports having stomach pain. Dr. Henrene Pastor does the pt need an OV or just a direct EGD......Marland KitchenPlease advise.

## 2011-02-01 NOTE — Telephone Encounter (Signed)
SET HIM UP FOR DIAGNOSTIC EGD IN [place]

## 2011-02-01 NOTE — Telephone Encounter (Signed)
NOT CLEAR WHY HE IS HAVING THE PAIN. GET AN ABDOMINAL US IF HE HAS NOT HAD ONE FOR THIS PAIN

## 2011-02-01 NOTE — Telephone Encounter (Signed)
Chart on Dr. Blanch Media desk for review.

## 2011-02-01 NOTE — Telephone Encounter (Signed)
ACTUALLY, HE CAN STAY ON PLAVIX

## 2011-02-01 NOTE — Telephone Encounter (Signed)
Pt scheduled for previsit 02/10/11@9am , EGD scheduled in the [place] 02/17/11@11 :30am. Pt states he is currently taking Plavix. Letter faxed to [name] office for instruction regarding the plavix.   Pt wants to know if there is anything he can take for the stomach pain he is having behind his navel. Pt is currently taking Nexium 40mg  BID. Dr. Henrene Pastor please advise.

## 2011-02-01 NOTE — Telephone Encounter (Signed)
Pt wants to know if there is anything he can take for the stomach pain he is having behind his navel. Pt is currently taking Nexium 40mg  BID. Dr. Henrene Pastor please advise.  Pt knows he may stay on Plavix.

## 2011-02-02 ENCOUNTER — Other Ambulatory Visit: Payer: Self-pay | Admitting: Internal Medicine

## 2011-02-02 NOTE — Telephone Encounter (Signed)
Pt scheduled for [place] of abdomen at [facility] 02/03/11 arrival time 9:15am, appt time 9:30am. Pt aware of appt date and time. Pt to be NPO after midnight.

## 2011-02-03 ENCOUNTER — Telehealth: Payer: Self-pay

## 2011-02-03 ENCOUNTER — Ambulatory Visit (HOSPITAL_COMMUNITY)
Admission: RE | Admit: 2011-02-03 | Discharge: 2011-02-03 | Disposition: A | Discharge status: Home / Self Care | Payer: BC Managed Care – PPO | Source: Ambulatory Visit | Attending: Internal Medicine | Admitting: Internal Medicine

## 2011-02-03 DIAGNOSIS — R109 Unspecified abdominal pain: Secondary | ICD-10-CM | POA: Insufficient documentation

## 2011-02-03 DIAGNOSIS — K8689 Other specified diseases of pancreas: Secondary | ICD-10-CM | POA: Insufficient documentation

## 2011-02-03 MED ORDER — HYOSCYAMINE SULFATE 0.125 MG SL SUBL: SUBLINGUAL_TABLET | SUBLINGUAL | Status: DC

## 2011-02-03 NOTE — Telephone Encounter (Signed)
Message copied by Faythe Casa on Thu Feb 03, 2011 10:42 AM ------      Message from: Irene Shipper      Created: Thu Feb 03, 2011 10:20 AM       LET [facility] KNOW THAT Korea WAS NORMAL EXCEPT FOR MILD DILATION OF PANCREAS DUCT. GET LFTS, LIPSAE, AND KEEP EGD APPT. UNTIL THEN TRY LEVSIN SL 0.125MG  , 1-2 SL Q4 HRS PRN PAIN

## 2011-02-03 NOTE — Telephone Encounter (Signed)
Pt aware and rx sent to the pharmacy. Pt wants to have labs drawn at [facility]. Orders faxed to [phone] for labs. Pt states he will probably go tomorrow for the labs.

## 2011-02-04 ENCOUNTER — Telehealth: Payer: Self-pay | Admitting: Internal Medicine

## 2011-02-04 NOTE — Telephone Encounter (Signed)
Let pt know we have not received the lab results yet.

## 2011-02-08 ENCOUNTER — Telehealth: Payer: Self-pay | Admitting: Gastroenterology

## 2011-02-08 NOTE — Telephone Encounter (Signed)
Pt wanted to make sure labs were faxed from [place] were placed on Dr. Blanch Media desk. Pt states he is still having bad abdominal pain. He is taking his Levsin but still having pain. Pt scheduled to see Dr. Henrene Pastor 02/10/11@8 :30am. Pt instructed to go to the ER or Urgent care if the pain became severe. Pt verbalized understanding.

## 2011-02-10 ENCOUNTER — Ambulatory Visit (AMBULATORY_SURGERY_CENTER): Payer: BC Managed Care – PPO | Admitting: *Deleted

## 2011-02-10 ENCOUNTER — Encounter: Payer: Self-pay | Admitting: Internal Medicine

## 2011-02-10 ENCOUNTER — Ambulatory Visit (INDEPENDENT_AMBULATORY_CARE_PROVIDER_SITE_OTHER): Payer: BC Managed Care – PPO | Admitting: Internal Medicine

## 2011-02-10 VITALS — BP 124/52 | HR 64 | Ht 65.0 in | Wt 139.4 lb

## 2011-02-10 VITALS — Ht 65.0 in | Wt 139.6 lb

## 2011-02-10 DIAGNOSIS — K259 Gastric ulcer, unspecified as acute or chronic, without hemorrhage or perforation: Secondary | ICD-10-CM

## 2011-02-10 DIAGNOSIS — R1084 Generalized abdominal pain: Secondary | ICD-10-CM

## 2011-02-10 DIAGNOSIS — R933 Abnormal findings on diagnostic imaging of other parts of digestive tract: Secondary | ICD-10-CM

## 2011-02-10 DIAGNOSIS — K219 Gastro-esophageal reflux disease without esophagitis: Secondary | ICD-10-CM

## 2011-02-10 NOTE — Progress Notes (Addendum)
HISTORY OF PRESENT ILLNESS:  [name] is a 65 y.o. male with multiple medical problems including hypertension, hyperlipidemia, coronary artery disease status post coronary artery bypass grafting, subsequent coronary artery stent placement for which he is on aspirin and Plavix, GERD complicated by erosive esophagitis and peptic stricture, peptic ulcer, adenomatous colon polyps, intermittent abdominal complaints with tendency toward constipation, and hemorrhoids. He was last evaluated in the office November 2011 regarding abdominal pain. See the dictation. On 02/04/2010 he subsequently underwent colonoscopy and upper endoscopy. Colonoscopy revealed 2 diminutive adenomas which were removed. Otherwise normal. Upper endoscopy revealed a benign stricture of the esophagus (asymptomatic) and a small ulcer in the pyloric channel. Helicobacter pylori testing negative. He was asked to avoid NSAIDs other than aspirin, continue PPI twice a day, and relook endoscopy in 8 weeks. He has not had that relook exam due to 2 other medical problems occurring in the antrum. He has had intermittent problems with abdominal pain over the past several months. Problem worsened in mid-November. He actually had cardiac stents placed around that time. Pain, however, occurred prior to intervention. He describes it as periumbilical and sharp. Some bandlike characteristics with bilateral radiation. No nausea, vomiting, or bleeding. She tells me that he has had 9 pound weight loss over the past 6 months, unintentionally. I did order an abdominal ultrasound which was performed 02/03/2011. No gallstones. The common bile duct was 6.7 mm and the pancreatic duct said to be mildly dilated at 4 mm. He did have a CT scan in June of 2011 without acute findings. The patient also reported worsening reflux symptoms around the time of pain. Minimal, acceptable, dysphagia.  REVIEW OF SYSTEMS:  All non-GI ROS negative except for back pain, muscle  cramps  Past Medical History  Diagnosis Date  . Coronary artery disease   . Hyperlipidemia   . Angina   . Myocardial infarction 1991  . Myocardial infarction 2005    "had 3 heart attacks this year"  . Dysrhythmia     "PAC's and PVC's"  . Bronchitis   . Shortness of breath     "when I had the heart attacks"  . Hiatal hernia   . GERD (gastroesophageal reflux disease)   . Ulcer     "small; Dr. Henrene Pastor found it 02/2010"  . Headache   . Chronic back pain   . DDD (degenerative disc disease)   . Bulging discs     "8 of them; thoracic, lumbar, sacral area"  . Blood transfusion   . Anemia   . Complication of anesthesia   . Esophageal dilatation     "have had it done 7 times; last time ~ 2001"  . Subclavian artery stenosis, left   . Colon polyps     Past Surgical History  Procedure Date  . Coronary angioplasty with stent placement 01/24/11    "today makes a total of 9 stents"  . Coronary artery bypass graft 1984    CABG X 4  . Coronary artery bypass graft 1996    CABG X 4  . Appendectomy 1953  . Tonsillectomy 1972  . Adenoidectomy     "when I was an infant"  . Retinal detachment surgery 04/2007    right    Social History [name]  reports that he quit smoking about 6 years ago. His smoking use included Cigarettes. He has a 38 pack-year smoking history. He has never used smokeless tobacco. He reports that he does not drink alcohol or use illicit  drugs.  family history includes Breast cancer in his paternal aunt; Diabetes in his sister; and Ulcers in his father.  There is no history of Colon cancer.  Allergies  Allergen Reactions  . Sulfonamide Derivatives     REACTION: rash       PHYSICAL EXAMINATION: Vital signs: BP 124/52  Pulse 64  Ht 5\' 5"  (1.651 m)  Wt 139 lb 6.4 oz (63.231 kg)  BMI 23.20 kg/m2 General: Well-developed, well-nourished, no acute distress HEENT: Sclerae are anicteric, conjunctiva pink. Oral mucosa intact Lungs: Clear Heart:  Regular Abdomen: soft, nontender, nondistended, no obvious ascites, no peritoneal signs, normal bowel sounds. No organomegaly. Extremities: No edema Psychiatric: alert and oriented x3. Cooperative    ASSESSMENT:  #1. Abdominal pain. Possible causes include breakthrough reflux, persistent gastric ulcer, or functional pain. Given the findings of his ultrasound and unexplained weight loss, rule out occult pancreatic lesion. #2. History of gastric ulcer as described #3. History of adenomatous colon polyps December 2011 #4. Multiple significant medical problems as outlined   PLAN:  #1. Upper endoscopy. The patient is high-risk given his comorbidities. Stay on Plavix.The nature of the procedure, as well as the risks, benefits, and alternatives were carefully and thoroughly reviewed with the patient. Ample time for discussion and questions allowed. The patient understood, was satisfied, and agreed to proceed. CRNA monitor propofol sedation would be best. #2. Continue twice a day PPI and reflux precautions #3. If upper endoscopy unrevealing then proceed with CT scan of the abdomen and pelvis with pancreatic protocol #4. Surveillance colonoscopy 2016  ADDENDUM. The patient had normal liver function tests and lipase level November 29,2012

## 2011-02-10 NOTE — Patient Instructions (Signed)
Make sure you keep your appointment for your endoscopy.  Instructions will be given at your previsit appointment

## 2011-02-10 NOTE — Progress Notes (Signed)
Addended by: Julieanne Cotton K on: 02/10/2011 11:23 AM   Modules accepted: Orders

## 2011-02-17 ENCOUNTER — Encounter: Payer: Self-pay | Admitting: Internal Medicine

## 2011-02-17 ENCOUNTER — Ambulatory Visit (AMBULATORY_SURGERY_CENTER): Payer: BC Managed Care – PPO | Admitting: Internal Medicine

## 2011-02-17 VITALS — BP 130/61 | HR 64 | Temp 96.7°F | Resp 18 | Ht 65.0 in | Wt 139.0 lb

## 2011-02-17 DIAGNOSIS — R1084 Generalized abdominal pain: Secondary | ICD-10-CM

## 2011-02-17 DIAGNOSIS — K222 Esophageal obstruction: Secondary | ICD-10-CM

## 2011-02-17 DIAGNOSIS — K259 Gastric ulcer, unspecified as acute or chronic, without hemorrhage or perforation: Secondary | ICD-10-CM

## 2011-02-17 MED ORDER — SODIUM CHLORIDE 0.9 % IV SOLN: 500.0000 mL | INTRAVENOUS | Status: DC

## 2011-02-17 NOTE — Op Note (Signed)
[facility] [address] [facility] [address] [place], [place]  [address]  ENDOSCOPY PROCEDURE REPORT  PATIENT:  [name], [name]  MR#:  [id number] BIRTHDATE:  October 31, 1945, 80 yrs. old  GENDER:  male  ENDOSCOPIST:  Docia Chuck. Geri Seminole, MD Referred by:  Office  PROCEDURE DATE:  02/17/2011 PROCEDURE:  EGD with biopsy, [id number] ASA CLASS:  Class III INDICATIONS:  abdominal pain (reports feeling better past 10 days), follow-up of gastric ulcer  MEDICATIONS:   MAC sedation, administered by CRNA, propofol (Diprivan) 120 mg IV TOPICAL ANESTHETIC:  none  DESCRIPTION OF PROCEDURE:   After the risks benefits and alternatives of the procedure were thoroughly explained, informed consent was obtained.  The LB [id number] endoscope was introduced through the mouth and advanced to the third portion of the duodenum, without limitations.  The instrument was slowly withdrawn as the mucosa was fully examined. <<PROCEDUREIMAGES>>  A benign stricture was found in the distal esophagus.  An tiny residual  ulcer was found at the pylorus with associated friability and mild but definite stenosis at the pylorus. Otherwise the examination was normal.    Retroflexed views revealed a hiatal hernia.    The scope was then withdrawn from the patient and the procedure completed.  COMPLICATIONS:  None  ENDOSCOPIC IMPRESSION: 1) Stricture in the distal esophagus 2) Tiny Ulcer at the pylorus (overall much better) 3) Stenosis at the pylorus 4) Otherwise normal examination 5) A hiatal hernia  RECOMMENDATIONS: 1) Continue PPI 2) Rx CLO if positive 3) Call office next 2-3 days to schedule an office appointment for 6-8 weeks (will consider CT if sx return)  ______________________________ Docia Chuck. Geri Seminole, MD  CC:  Garwin Brothers MD;  The Patient  n. eSIGNED:   Docia Chuck. Geri Seminole at 02/17/2011 12:28 PM  [place], [place], [id number]

## 2011-02-17 NOTE — Progress Notes (Signed)
Pt states that he has a blockage in his left subclavian.  His cardiologist suggests that he always have his BP checked in the right arm.  IV started in left hand.

## 2011-02-17 NOTE — Patient Instructions (Signed)
Resume all medications. Information given on Gerd,stricture. Call office to schedule follow up appointment for6-8 weeks. D/C Information completed.

## 2011-02-17 NOTE — Progress Notes (Signed)
Patient did not have preoperative order for IV antibiotic SSI prophylaxis. (G8918)  Patient did not experience any of the following events: a burn prior to discharge; a fall within the facility; wrong site/side/patient/procedure/implant event; or a hospital transfer or hospital admission upon discharge from the facility. (G8907)  

## 2011-02-18 ENCOUNTER — Telehealth: Payer: Self-pay | Admitting: *Deleted

## 2011-02-18 NOTE — Telephone Encounter (Signed)

## 2011-02-21 LAB — HELICOBACTER PYLORI SCREEN-BIOPSY: UREASE: NEGATIVE

## 2011-03-25 ENCOUNTER — Ambulatory Visit: Payer: BC Managed Care – PPO | Admitting: Internal Medicine

## 2011-04-14 ENCOUNTER — Ambulatory Visit (INDEPENDENT_AMBULATORY_CARE_PROVIDER_SITE_OTHER): Payer: BC Managed Care – PPO | Admitting: Internal Medicine

## 2011-04-14 ENCOUNTER — Encounter: Payer: Self-pay | Admitting: Internal Medicine

## 2011-04-14 VITALS — BP 112/62 | HR 68 | Ht 67.0 in | Wt 141.8 lb

## 2011-04-14 DIAGNOSIS — Z86010 Personal history of colonic polyps: Secondary | ICD-10-CM

## 2011-04-14 DIAGNOSIS — K259 Gastric ulcer, unspecified as acute or chronic, without hemorrhage or perforation: Secondary | ICD-10-CM

## 2011-04-14 DIAGNOSIS — K219 Gastro-esophageal reflux disease without esophagitis: Secondary | ICD-10-CM

## 2011-04-14 DIAGNOSIS — R1013 Epigastric pain: Secondary | ICD-10-CM

## 2011-04-14 MED ORDER — ESOMEPRAZOLE MAGNESIUM 40 MG PO CPDR: 40.0000 mg | DELAYED_RELEASE_CAPSULE | Freq: Two times a day (BID) | ORAL | Status: DC

## 2011-04-14 NOTE — Patient Instructions (Signed)
Your Nexium has been refilled with a year of refills. Follow up as needed.

## 2011-04-14 NOTE — Progress Notes (Signed)
HISTORY OF PRESENT ILLNESS:  [name] is a 66 y.o. male with multiple medical problems including hypertension, hyperlipidemia, coronary artery disease status post coronary artery bypass grafting, subsequent coronary artery stent placement for which he is on aspirin and Plavix, GERD complicated by erosive esophagitis and peptic stricture, peptic ulcer, adenomatous colon polyps, intermittent abdominal complaints with tendency toward constipation, and hemorrhoids. He was evaluated in the office November 2011 regarding abdominal pain. See the dictation. On 02/04/2010 he underwent colonoscopy and upper endoscopy. Colonoscopy revealed 2 diminutive adenomas which were removed. Otherwise normal. Upper endoscopy revealed a benign stricture of the esophagus (asymptomatic) and a small ulcer in the pyloric channel. Helicobacter pylori testing negative. He was asked to avoid NSAIDs other than aspirin, continue PPI twice a day, and relook endoscopy in 8 weeks. He has not had that relook exam due to other medical problems occurring in the interim. In mid November 2012 he began having problems with abdominal pain. Abdominal ultrasound raises a question of a mildly dilated pancreatic duct. Previous CT scan from June of 2011 was negative. He subsequently underwent upper endoscopy 02/17/2011. At that time he reported improvement in his pain. He was found to have a distal esophageal stricture, type pyloric ulcer, and pyloric stenosis. He has continued on PPI. He presents today for followup. He is glad to report that his abdominal pain remains absent. He did have one occasion where his right inguinal hernia was tender and upon palpation led to referred abdominal pain. He remains concerned about weight loss. Our scale shows he has gained 2 pounds in the past 2 months. He eats well. He also complains about muscle weakness. He brings with him the laboratory report demonstrating hemoglobin 11.9 (12-15 normal range). This is being  followed by his cardiologist. For chronic constipation, GI review of systems is negative.Marland Kitchen   REVIEW OF SYSTEMS:  All non-GI ROS negative except for generalized weakness, muscle weakness, chronic back pain  Past Medical History  Diagnosis Date  . Coronary artery disease   . Hyperlipidemia   . Angina   . Myocardial infarction 1991  . Myocardial infarction 2005    "had 3 heart attacks this year"  . Dysrhythmia     "PAC's and PVC's"  . Bronchitis   . Shortness of breath     "when I had the heart attacks"  . Hiatal hernia   . GERD (gastroesophageal reflux disease)   . Ulcer     "small; Dr. Henrene Pastor found it 02/2010"  . Headache   . Chronic back pain   . DDD (degenerative disc disease)   . Bulging discs     "8 of them; thoracic, lumbar, sacral area"  . Blood transfusion   . Anemia   . Complication of anesthesia   . Esophageal dilatation     "have had it done 7 times; last time ~ 2001"  . Subclavian artery stenosis, left   . Colon polyps   . Esophageal stricture   . Pyloric stenosis     Past Surgical History  Procedure Date  . Coronary angioplasty with stent placement 01/24/11    "today makes a total of 9 stents"  . Coronary artery bypass graft 1984    CABG X 4  . Coronary artery bypass graft 1996    CABG X 4  . Appendectomy 1953  . Tonsillectomy 1972  . Adenoidectomy     "when I was an infant"  . Retinal detachment surgery 04/2007    right  . Pylondial  cyst removal     Social History EDER STROME  reports that he quit smoking about 6 years ago. His smoking use included Cigarettes. He has a 38 pack-year smoking history. He has never used smokeless tobacco. He reports that he does not drink alcohol or use illicit drugs.  family history includes Breast cancer in his paternal aunt; Diabetes in his sister; Heart disease in his father; and Ulcers in his father.  There is no history of Colon cancer.  Allergies  Allergen Reactions  . Sulfonamide Derivatives      REACTION: rash       PHYSICAL EXAMINATION: Vital signs: BP 112/62  Pulse 68  Ht 5\' 7"  (1.702 m)  Wt 141 lb 12.8 oz (64.32 kg)  BMI 22.21 kg/m2  SpO2 98% General: Well-developed, well-nourished, no acute distress HEENT: Sclerae are anicteric, conjunctiva pink. Oral mucosa intact Lungs: Clear Heart: Regular Abdomen: soft, nontender, nondistended, no obvious ascites, no peritoneal signs, normal bowel sounds. No organomegaly. Extremities: No edema Psychiatric: alert and oriented x3. Cooperative    ASSESSMENT:  #1. Abdominal pain. Resolve. No problems for the past 8 weeks #2. GERD complicated by peptic stricture. Currently asymptomatic post dilation on PPI #3 History of gastric ulcer. Improved on recent endoscopy. Some evidence of pyloric stenosis. #4. History of adenomatous colon polyps on colonoscopy December 2011 #5. Multiple medical problems #6. Non-GI complaints including muscle weakness, wasting, and gradual weight loss   PLAN:  #1. Continue PPI therapy. #2. Nexium refilled at his request. Multiple refills. #3. Continue cardiac followup with her cardiologist #4. See your primary care provider regarding non-GI complaints listed  #5. Surveillance colonoscopy around December 2016 #6. GI followup as needed

## 2011-07-12 ENCOUNTER — Other Ambulatory Visit: Payer: Self-pay | Admitting: Internal Medicine

## 2011-09-14 ENCOUNTER — Telehealth: Payer: Self-pay | Admitting: Internal Medicine

## 2011-09-14 MED ORDER — ESOMEPRAZOLE MAGNESIUM 40 MG PO CPDR: 40.0000 mg | DELAYED_RELEASE_CAPSULE | Freq: Two times a day (BID) | ORAL | Status: DC

## 2011-09-14 NOTE — Telephone Encounter (Signed)
Spoke to [place] with [employer] and got authorization to renew prescription;  Called patient to let him know; left message

## 2012-02-24 ENCOUNTER — Other Ambulatory Visit (HOSPITAL_COMMUNITY): Payer: Self-pay | Admitting: Cardiovascular Disease

## 2012-02-24 DIAGNOSIS — R0989 Other specified symptoms and signs involving the circulatory and respiratory systems: Secondary | ICD-10-CM

## 2012-02-24 DIAGNOSIS — I519 Heart disease, unspecified: Secondary | ICD-10-CM

## 2012-02-24 DIAGNOSIS — I739 Peripheral vascular disease, unspecified: Secondary | ICD-10-CM

## 2012-02-24 DIAGNOSIS — I4510 Unspecified right bundle-branch block: Secondary | ICD-10-CM

## 2012-03-08 ENCOUNTER — Ambulatory Visit (HOSPITAL_COMMUNITY)
Admission: RE | Admit: 2012-03-08 | Discharge: 2012-03-08 | Disposition: A | Discharge status: Home / Self Care | Payer: BC Managed Care – PPO | Source: Ambulatory Visit | Attending: Cardiovascular Disease | Admitting: Cardiovascular Disease

## 2012-03-08 ENCOUNTER — Ambulatory Visit (HOSPITAL_COMMUNITY): Payer: BC Managed Care – PPO

## 2012-03-08 DIAGNOSIS — I519 Heart disease, unspecified: Secondary | ICD-10-CM

## 2012-03-08 DIAGNOSIS — I2510 Atherosclerotic heart disease of native coronary artery without angina pectoris: Secondary | ICD-10-CM | POA: Insufficient documentation

## 2012-03-08 DIAGNOSIS — I4510 Unspecified right bundle-branch block: Secondary | ICD-10-CM | POA: Insufficient documentation

## 2012-03-08 NOTE — Progress Notes (Signed)
[facility]   2D echo completed 03/08/2012.   Jamison Neighbor, RDCS

## 2012-03-09 ENCOUNTER — Telehealth: Payer: Self-pay | Admitting: Internal Medicine

## 2012-03-12 NOTE — Telephone Encounter (Signed)
Called [facility] to initial a prior authorization.  Was told that he still had authorization for his Rx for Nexium but there had been a misunderstanding because pt's insurance had changed.  Representative at [facility] got it worked out and told me to call the pharmacy to tell them to rerun it.  I called the pt and relayed this information.  I then called [facility] and told them to rerun the prescription

## 2012-03-28 ENCOUNTER — Encounter (HOSPITAL_COMMUNITY): Payer: BC Managed Care – PPO

## 2012-04-20 ENCOUNTER — Encounter (HOSPITAL_COMMUNITY): Payer: BC Managed Care – PPO

## 2012-04-28 ENCOUNTER — Encounter: Payer: Self-pay | Admitting: *Deleted

## 2012-05-10 ENCOUNTER — Ambulatory Visit (HOSPITAL_COMMUNITY)
Admission: RE | Admit: 2012-05-10 | Discharge: 2012-05-10 | Disposition: A | Discharge status: Home / Self Care | Payer: BC Managed Care – PPO | Source: Ambulatory Visit | Attending: Cardiovascular Disease | Admitting: Cardiovascular Disease

## 2012-05-10 DIAGNOSIS — I739 Peripheral vascular disease, unspecified: Secondary | ICD-10-CM | POA: Insufficient documentation

## 2012-05-10 DIAGNOSIS — R0989 Other specified symptoms and signs involving the circulatory and respiratory systems: Secondary | ICD-10-CM

## 2012-05-10 NOTE — Progress Notes (Signed)
BLE Arterial Duplex Completed. [name]

## 2012-05-10 NOTE — Progress Notes (Signed)
Carotid Duplex Completed. [name]  

## 2012-09-05 ENCOUNTER — Other Ambulatory Visit: Payer: Self-pay | Admitting: Internal Medicine

## 2012-10-05 ENCOUNTER — Telehealth: Payer: Self-pay

## 2012-10-05 NOTE — Telephone Encounter (Signed)
Faxed prior authorization for Nexium to [facility].  Awaiting response

## 2012-10-08 ENCOUNTER — Other Ambulatory Visit: Payer: Self-pay | Admitting: *Deleted

## 2012-10-08 DIAGNOSIS — I701 Atherosclerosis of renal artery: Secondary | ICD-10-CM

## 2012-10-09 ENCOUNTER — Other Ambulatory Visit: Payer: Self-pay | Admitting: *Deleted

## 2012-10-09 DIAGNOSIS — R0989 Other specified symptoms and signs involving the circulatory and respiratory systems: Secondary | ICD-10-CM

## 2012-10-09 DIAGNOSIS — Z01810 Encounter for preprocedural cardiovascular examination: Secondary | ICD-10-CM

## 2012-10-10 ENCOUNTER — Other Ambulatory Visit: Payer: Self-pay | Admitting: *Deleted

## 2012-10-10 ENCOUNTER — Encounter: Payer: Self-pay | Admitting: Cardiovascular Disease

## 2012-10-10 DIAGNOSIS — I701 Atherosclerosis of renal artery: Secondary | ICD-10-CM

## 2012-10-16 ENCOUNTER — Ambulatory Visit (HOSPITAL_COMMUNITY)
Admission: RE | Admit: 2012-10-16 | Discharge: 2012-10-16 | Disposition: A | Discharge status: Home / Self Care | Payer: BC Managed Care – PPO | Source: Ambulatory Visit | Attending: Cardiovascular Disease | Admitting: Cardiovascular Disease

## 2012-10-16 ENCOUNTER — Other Ambulatory Visit: Payer: Self-pay | Admitting: *Deleted

## 2012-10-16 DIAGNOSIS — R0989 Other specified symptoms and signs involving the circulatory and respiratory systems: Secondary | ICD-10-CM

## 2012-10-16 DIAGNOSIS — I701 Atherosclerosis of renal artery: Secondary | ICD-10-CM | POA: Insufficient documentation

## 2012-10-16 NOTE — Progress Notes (Signed)
Renal Artery Duplex Completed. °Brianna L Mazza ° °

## 2012-11-22 ENCOUNTER — Telehealth: Payer: Self-pay | Admitting: Cardiovascular Disease

## 2012-11-22 NOTE — Telephone Encounter (Signed)
Since his medical condition is so complicated he would like Dr Rollene Fare to recommend him a doctor here.

## 2012-11-22 NOTE — Telephone Encounter (Signed)
Question deferred to Dr. Rollene Fare and Lavella Hammock

## 2012-11-28 ENCOUNTER — Other Ambulatory Visit: Payer: Self-pay | Admitting: Cardiovascular Disease

## 2012-11-28 MED ORDER — NITROGLYCERIN 0.4 MG/SPRAY TL SOLN: 1.0000 | Status: DC | PRN

## 2012-11-28 NOTE — Telephone Encounter (Signed)
Returned call.  Pt not in.  Left message w/ pt's wife, Pamala Hurry, that refill sent to pharmacy.  Agreed to inform pt.

## 2012-11-28 NOTE — Telephone Encounter (Signed)
Did we call or fax refill for [facility] to [facility] [address] in [place].  Patient states that he spoke with JC on Friday of last week and he said that we could take care of it.

## 2012-12-03 ENCOUNTER — Encounter: Payer: Self-pay | Admitting: Vascular Surgery

## 2012-12-04 ENCOUNTER — Ambulatory Visit (HOSPITAL_COMMUNITY)
Admission: RE | Admit: 2012-12-04 | Discharge: 2012-12-04 | Disposition: A | Discharge status: Home / Self Care | Payer: BC Managed Care – PPO | Source: Ambulatory Visit | Attending: Vascular Surgery | Admitting: Vascular Surgery

## 2012-12-04 ENCOUNTER — Encounter: Payer: Self-pay | Admitting: Vascular Surgery

## 2012-12-04 ENCOUNTER — Ambulatory Visit (INDEPENDENT_AMBULATORY_CARE_PROVIDER_SITE_OTHER): Payer: BC Managed Care – PPO | Admitting: Vascular Surgery

## 2012-12-04 VITALS — BP 93/35 | HR 52 | Ht 67.0 in | Wt 137.4 lb

## 2012-12-04 DIAGNOSIS — Z48812 Encounter for surgical aftercare following surgery on the circulatory system: Secondary | ICD-10-CM

## 2012-12-04 DIAGNOSIS — I63239 Cerebral infarction due to unspecified occlusion or stenosis of unspecified carotid arteries: Secondary | ICD-10-CM

## 2012-12-04 DIAGNOSIS — R0989 Other specified symptoms and signs involving the circulatory and respiratory systems: Secondary | ICD-10-CM

## 2012-12-04 DIAGNOSIS — I6529 Occlusion and stenosis of unspecified carotid artery: Secondary | ICD-10-CM

## 2012-12-04 NOTE — Progress Notes (Signed)
[facility]    Patient name: [name] MRN: [id number] DOB: 07/24/1945 Sex: male   Referred by: Rollene Fare  Reason for referral:  Chief Complaint  Patient presents with  . New Evaluation    R CEA by tfe 1996 referred back for close f/u and assess need for L CEA    HISTORY OF PRESENT ILLNESS: Patient presents today for followup of peripheral vascular occlusive disease. He is known to me from a prior right carotid endarterectomy by myself in 1996. He did well since that time. He does have a known moderate left carotid stenosis and is being followed with serial ultrasounds. He does have history of significant coronary disease with multiple prior stenting procedures and myocardial infarctions. He is extremely active and walking program and is comfortable with this. He does have known superficial femoral artery occlusive disease  Past Medical History  Diagnosis Date  . Coronary artery disease 01/24/11    Successful PCI long segmental stensois  vein graft to the CX marginal vessel-graft previously stented prox. 3.0x92mm TAXUS stent mid seg 3.0x54mm TAXUS stent now tandem stens of 3.0x68mm & 3.0x30mm placed with the seg. 50,80 & 99% stenosis reduced to 0%  . Hyperlipidemia   . Angina   . Myocardial infarction 1991  . Myocardial infarction 2005    "had 3 heart attacks this year"  . Dysrhythmia     "PAC's and PVC's"  . Bronchitis   . Shortness of breath     "when I had the heart attacks"  . Hiatal hernia   . GERD (gastroesophageal reflux disease)   . Ulcer     "small; Dr. Henrene Pastor found it 02/2010"  . Headache(784.0)   . Chronic back pain   . DDD (degenerative disc disease)   . Bulging discs     "8 of them; thoracic, lumbar, sacral area"  . Blood transfusion   . Anemia   . Complication of anesthesia   . Esophageal dilatation     "have had it done 7 times; last time ~ 2001"  . Subclavian artery stenosis, left   . Colon polyps   . Esophageal stricture    . Pyloric stenosis   . S/P CABG (coronary artery bypass graft) [place]  . Renal artery stenosis   . RBBB     Past Surgical History  Procedure Laterality Date  . Coronary angioplasty with stent placement  01/24/11    "today makes a total of 9 stents"  . Coronary artery bypass graft  1984    CABG X 4  . Coronary artery bypass graft  1996    CABG X 4  . Appendectomy  1953  . Tonsillectomy  1972  . Adenoidectomy      "when I was an infant"  . Retinal detachment surgery  04/2007    right  . Pylondial cyst removal      History   Social History  . Marital Status: Married    Spouse Name: N/A    Number of Children: N/A  . Years of Education: N/A   Occupational History  . Not on file.   Social History Main Topics  . Smoking status: Former Smoker -- 1.00 packs/day for 38 years    Types: Cigarettes    Quit date: 02/09/2005  . Smokeless tobacco: Never Used     Comment: "quit smoking cigarrettes 2006"  . Alcohol Use: No  . Drug Use: No  .  Sexual Activity: Not on file   Other Topics Concern  . Not on file   Social History Narrative  . No narrative on file    Family History  Problem Relation Age of Onset  . Colon cancer Neg Hx   . Breast cancer Paternal Aunt   . Diabetes Sister   . Ulcers Father   . Heart disease Father   . Heart failure Father   . Heart attack Father   . AAA (abdominal aortic aneurysm) Father   . Stroke Mother   . Hypertension Mother     Allergies as of 12/04/2012 - Review Complete 12/04/2012  Allergen Reaction Noted  . Sulfonamide derivatives  01/30/2008    Current Outpatient Prescriptions on File Prior to Visit  Medication Sig Dispense Refill  . amLODipine (NORVASC) 2.5 MG tablet Take 2.5 mg by mouth 2 (two) times daily.        Marland Kitchen aspirin 81 MG tablet Take 81 mg by mouth daily.      Marland Kitchen atenolol (TENORMIN) 25 MG tablet Take 25 mg by mouth 2 (two) times daily.        . butalbital-aspirin-caffeine  (FIORINAL) 50-325-40 MG per capsule Take 1 capsule by mouth every 4 (four) hours as needed. FOR HEADACHE.       Marland Kitchen clopidogrel (PLAVIX) 75 MG tablet Take 75 mg by mouth daily.       Marland Kitchen FOLIC ACID PO Take by mouth.        . hyoscyamine (LEVSIN/SL) 0.125 MG SL tablet Take 1-2 po every 4 hours as needed for pain  60 tablet  0  . IRON PO Take by mouth.        . isosorbide mononitrate (IMDUR) 30 MG 24 hr tablet Take 30 mg by mouth daily.        Marland Kitchen lovastatin (MEVACOR) 40 MG tablet       . Multiple Vitamins-Minerals (MULTIVITAMINS THER. W/MINERALS) TABS Take 1 tablet by mouth daily.        Marland Kitchen NEXIUM 40 MG capsule TAKE ONE CAPSULE TWICE A DAY  60 capsule  6  . nitroGLYCERIN (NITROLINGUAL) 0.4 MG/SPRAY spray Place 1 spray under the tongue every 5 (five) minutes as needed (up to 3 sprays in 15 mins).  4.9 g  0  . torsemide (DEMADEX) 20 MG tablet Take 20 mg by mouth as needed.      . vitamin E 200 UNIT capsule Take 200 Units by mouth daily.         No current facility-administered medications on file prior to visit.     REVIEW OF SYSTEMS:  Positives indicated with an "X"  CARDIOVASCULAR:  [ ]  chest pain   [ ]  chest pressure   [ ]  palpitations   [ ]  orthopnea   [ ]  dyspnea on exertion   [ ]  claudication   [ ]  rest pain   [ ]  DVT   [ ]  phlebitis PULMONARY:   [ ]  productive cough   [ ]  asthma   [ ]  wheezing NEUROLOGIC:   [ ]  weakness  [ ]  paresthesias  [ ]  aphasia  [ ]  amaurosis  [ ]  dizziness HEMATOLOGIC:   [ ]  bleeding problems   [ ]  clotting disorders MUSCULOSKELETAL:  [ ]  joint pain   [ ]  joint swelling GASTROINTESTINAL: [ ]   blood in stool  [ ]   hematemesis GENITOURINARY:  [ ]   dysuria  [ ]   hematuria PSYCHIATRIC:  [ ]  history of major depression INTEGUMENTARY:  [ ]   rashes  [ ]  ulcers CONSTITUTIONAL:  [ ]  fever   [ ]  chills  PHYSICAL EXAMINATION:  General: The patient is a well-nourished male, in no acute distress. Vital signs are BP 93/35  Pulse 52  Ht 5\' 7"  (1.702 m)  Wt 137 lb 6.4 oz  (62.324 kg)  BMI 21.51 kg/m2  SpO2 100% Pulmonary: There is a good air exchange bilaterally without wheezing or rales. Abdomen: Soft and non-tender with normal pitch bowel sounds. Musculoskeletal: There are no major deformities.  There is no significant extremity pain. Neurologic: No focal weakness or paresthesias are detected, Skin: There are no ulcer or rashes noted. Psychiatric: The patient has normal affect. Cardiovascular: There is a regular rate and rhythm without significant murmur appreciated. Pulse status 2+ femoral pulses bilaterally 1+ dorsalis pedis pulses bilaterally and 2+ radial pulses bilaterally  [facility] Vascular Lab Studies:  Ordered and Independently Reviewed carotid duplex today reveals patent endarterectomy on the right with moderate restenosis in the 4059% range. Also 40% left carotid stenoses.  Impression and Plan:  Stable asymptomatic carotid disease. Discussed symptoms of carotid disease and sent and the patient will notify should he should this occur. Otherwise we'll see him in one year with a carotid duplex. He will continue his walking program (large and the peripheral disease    Yuriko Portales [facility] [place] Office: [phone]

## 2012-12-05 NOTE — Addendum Note (Signed)
Addended by: Mena Goes on: 12/05/2012 08:02 AM   Modules accepted: Orders

## 2012-12-19 NOTE — Telephone Encounter (Signed)
See approval under "media" dated 10/26/12.

## 2013-01-10 ENCOUNTER — Other Ambulatory Visit: Payer: Self-pay

## 2013-03-04 ENCOUNTER — Ambulatory Visit (INDEPENDENT_AMBULATORY_CARE_PROVIDER_SITE_OTHER): Payer: Medicare Other | Admitting: Cardiovascular Disease

## 2013-03-04 ENCOUNTER — Encounter: Payer: Self-pay | Admitting: Cardiovascular Disease

## 2013-03-04 VITALS — BP 124/68 | Ht 65.0 in | Wt 141.7 lb

## 2013-03-04 DIAGNOSIS — R5381 Other malaise: Secondary | ICD-10-CM

## 2013-03-04 DIAGNOSIS — E785 Hyperlipidemia, unspecified: Secondary | ICD-10-CM

## 2013-03-04 DIAGNOSIS — Z9889 Other specified postprocedural states: Secondary | ICD-10-CM

## 2013-03-04 DIAGNOSIS — I452 Bifascicular block: Secondary | ICD-10-CM

## 2013-03-04 DIAGNOSIS — I10 Essential (primary) hypertension: Secondary | ICD-10-CM

## 2013-03-04 DIAGNOSIS — I739 Peripheral vascular disease, unspecified: Secondary | ICD-10-CM

## 2013-03-04 DIAGNOSIS — I2510 Atherosclerotic heart disease of native coronary artery without angina pectoris: Secondary | ICD-10-CM

## 2013-03-04 DIAGNOSIS — I209 Angina pectoris, unspecified: Secondary | ICD-10-CM

## 2013-03-04 DIAGNOSIS — Z79899 Other long term (current) drug therapy: Secondary | ICD-10-CM

## 2013-03-04 DIAGNOSIS — Z95828 Presence of other vascular implants and grafts: Secondary | ICD-10-CM

## 2013-03-04 DIAGNOSIS — I779 Disorder of arteries and arterioles, unspecified: Secondary | ICD-10-CM

## 2013-03-04 DIAGNOSIS — E782 Mixed hyperlipidemia: Secondary | ICD-10-CM

## 2013-03-04 DIAGNOSIS — I25810 Atherosclerosis of coronary artery bypass graft(s) without angina pectoris: Secondary | ICD-10-CM

## 2013-03-04 MED ORDER — RANOLAZINE ER 500 MG PO TB12: 500.0000 mg | ORAL_TABLET | Freq: Two times a day (BID) | ORAL | Status: DC

## 2013-03-04 NOTE — Patient Instructions (Signed)
Labs CMP,CBC ,TSH ,NMR with LIPIDS  STARE RANEXA 500 MG TWICE A DAY -- ONE TABLET   Your physician wants you to follow-up in [place]. You will receive a reminder letter in the mail two months in advance. If you don't receive a letter, please call our office to schedule the follow-up appointment.

## 2013-03-04 NOTE — Progress Notes (Signed)
Patient ID: [name], male   DOB: 1946/01/15, 67 y.o.   MRN: [id number]     HPI: [name] is a 67 y.o. male who is a former patient of Dr. Rollene Fare. He presents to the office now to establish cardiology care with me.  [name] has an extensive cardiac and peripheral vascular history. He underwent initial CBG revascularization surgery in [place] in 1984 and in1996 he required re-do CABG revascularization surgery after all grafts were found to be occluded. At that time he also underwent right carotid endarterectomy. In 1998 he underwent stenting of his left main coronary artery and also had stenting done to the vein graft supplying the marginal vessel. Has history of right renal artery stenosis and underwent initial stenting with predilatation 2005. He has documented chronic right bundle branch block. He also significant peripheral vascular disease with bilateral SFA occlusion. He has known left subclavian artery occlusion with right subclavian stenosis. Has a history of hypertension, hyperlipidemia, and remotely smoked cigarettes. His last catheterization was done in November 2012 which showed significant native CAD with evidence for pain left main stent. He had an occluded circumflex marginal vessel from the AV groove circumflex. There was 80% ostial RCA stenosis with 90% stenosis in the mid right coronary artery proximal to the RV marginal branch and total occlusion of the mid RCA. An occluded vein graft supplying the right coronary artery but evidence for collateralization to the distal right artery the left coronary injection and injection of the grafts. The vein supplying the cervix marginal vessel with the previously placed ostial proximal 0.0x12 mm Taxus stent in-stent narrowing of 50% followed by 95% stenosis in the body of the proximal third of the vessel graft prior to the previously placed mid grafts 3.0x20 mm Taxus stent. At that time, he underwent difficult but successful  intervention involving long segmental stenosis of the vein graft supplying the circumflex marginal vessel. Additional tandem 3.0x38 mm and 3.0x8 mm Taxus stents were inserted with excellent angiographic result. Since then, he has noticed significant improvement of his chest pain but he still notes occasional episodes of angina typically with the initiation of activity and oftentimes this improves after he "warms up." He is participating in cardiac rehabilitation at [facility].  He last saw Dr. Rollene Fare in August 2014 per at that time due to concern for progressive carotid stenoses repeat Doppler studies were done and he also saw Dr. Sherren Mocha early for followup evaluation. Repeat imaging was performed which showed a patent right carotid endarterectomy site the Doppler velocity suggestive of 40 given ice percent stenosis of the bilateral proximal internal carotid arteries. There was left subclavian steal noted with retrograde left vertebral artery flow, monophasic left subclavian artery flow and significant difference in the bilateral brachial pressures.  Patient now presents for evaluation.  Past Medical History  Diagnosis Date  . Coronary artery disease 01/24/11    Successful PCI long segmental stensois  vein graft to the CX marginal vessel-graft previously stented prox. 3.0x9mm TAXUS stent mid seg 3.0x49mm TAXUS stent now tandem stens of 3.0x6mm & 3.0x76mm placed with the seg. 50,80 & 99% stenosis reduced to 0%  . Hyperlipidemia   . Angina   . Myocardial infarction 1991  . Myocardial infarction 2005    "had 3 heart attacks this year"  . Dysrhythmia     "PAC's and PVC's"  . Bronchitis   . Shortness of breath     "when I had the heart attacks"  . Hiatal  hernia   . GERD (gastroesophageal reflux disease)   . Ulcer     "small; Dr. Henrene Pastor found it 02/2010"  . Headache(784.0)   . Chronic back pain   . DDD (degenerative disc disease)   . Bulging discs     "8 of them; thoracic, lumbar, sacral  area"  . Blood transfusion   . Anemia   . Complication of anesthesia   . Esophageal dilatation     "have had it done 7 times; last time ~ 2001"  . Subclavian artery stenosis, left   . Colon polyps   . Esophageal stricture   . Pyloric stenosis   . S/P CABG (coronary artery bypass graft) [place]  . Renal artery stenosis   . RBBB     Past Surgical History  Procedure Laterality Date  . Coronary angioplasty with stent placement  01/24/11    "today makes a total of 9 stents"  . Coronary artery bypass graft  1984    CABG X 4  . Coronary artery bypass graft  1996    CABG X 4  . Appendectomy  1953  . Tonsillectomy  1972  . Adenoidectomy      "when I was an infant"  . Retinal detachment surgery  04/2007    right  . Pylondial cyst removal      Allergies  Allergen Reactions  . Sulfonamide Derivatives     REACTION: rash    Current Outpatient Prescriptions  Medication Sig Dispense Refill  . amLODipine (NORVASC) 2.5 MG tablet Take 2.5 mg by mouth 2 (two) times daily.        Marland Kitchen aspirin 81 MG tablet Take 81 mg by mouth daily.      Marland Kitchen atenolol (TENORMIN) 25 MG tablet Take 25 mg by mouth 2 (two) times daily.        . butalbital-aspirin-caffeine (FIORINAL) 50-325-40 MG per capsule Take 1 capsule by mouth every 4 (four) hours as needed. FOR HEADACHE.       Marland Kitchen clopidogrel (PLAVIX) 75 MG tablet Take 75 mg by mouth daily.       . Coenzyme Q10 (CO Q-10) 100 MG CAPS Take by mouth.      . losartan (COZAAR) 50 MG tablet Take 50 mg by mouth daily.      Marland Kitchen lovastatin (MEVACOR) 40 MG tablet       . Multiple Vitamins-Minerals (MULTIVITAMINS THER. W/MINERALS) TABS Take 1 tablet by mouth daily.        Marland Kitchen NEXIUM 40 MG capsule TAKE ONE CAPSULE TWICE A DAY  60 capsule  6  . nitroGLYCERIN (NITROLINGUAL) 0.4 MG/SPRAY spray Place 1 spray under the tongue every 5 (five) minutes as needed (up to 3 sprays in 15 mins).  4.9 g  0  . torsemide (DEMADEX) 20 MG tablet Take 20 mg  by mouth as needed.      . vitamin E 200 UNIT capsule Take 200 Units by mouth daily.        . ranolazine (RANEXA) 500 MG 12 hr tablet Take 1 tablet (500 mg total) by mouth 2 (two) times daily.  60 tablet  11   No current facility-administered medications for this visit.    History   Social History  . Marital Status: Married    Spouse Name: N/A    Number of Children: N/A  . Years of Education: N/A   Occupational History  . Not on file.   Social History  Main Topics  . Smoking status: Former Smoker -- 1.00 packs/day for 38 years    Types: Cigarettes    Quit date: 02/09/2005  . Smokeless tobacco: Never Used     Comment: "quit smoking cigarrettes 2006"  . Alcohol Use: No  . Drug Use: No  . Sexual Activity: Not on file   Other Topics Concern  . Not on file   Social History Narrative  . No narrative on file   Socially he is married has 2 children 2 stepchildren. He quit tobacco in 2000.  Family History  Problem Relation Age of Onset  . Colon cancer Neg Hx   . Breast cancer Paternal Aunt   . Diabetes Sister   . Ulcers Father   . Heart disease Father   . Heart failure Father   . Heart attack Father   . AAA (abdominal aortic aneurysm) Father   . Stroke Mother   . Hypertension Mother     ROS is negative for fevers, chills or night sweats. He denies headaches. He denies significant change in weight. He has a history of a detached retina in the right eye. He denies change in hearing. There is no awareness of lymphadenopathy.  He denies cough, wheezing, or increased sputum production or purulence. There is no presyncope or syncope. He still notes occasional episodes of mild chest burning when he starts activity. Typically also he will experience angina if he eats before he exercises. He denies nausea vomiting or diarrhea. He denies blood in stool or urine. He denies change in claudication. He does note some mild ankle swelling. He denies tremor. He denies neurologic symptoms.  There is no diabetes. Is no history of thyroid disease. He is unaware of sleep-disordered breathing. Other comprehensive 14 point system review is negative.  PE BP 124/68  Ht 5\' 5"  (1.651 m)  Wt 141 lb 11.2 oz (64.275 kg)  BMI 23.58 kg/m2  General: Alert, oriented, no distress.  Skin: normal turgor, no rashes HEENT: Normocephalic, atraumatic. Pupils round and reactive; sclera anicteric;no lid lag.  Nose without nasal septal hypertrophy Mouth/Parynx benign; Mallinpatti scale 3 Neck: No JVD, no carotid briuts Lungs: clear to ausculatation and percussion; no wheezing or rales Chest wall: no tenderness to palpitation Heart: RRR, s1 s2 normal with a 2/6 systolic murmur along the left sternal border. No S3 or S4 gallop. No diastolic murmurs.  Abdomen: soft, nontender; no hepatosplenomehaly, BS+; abdominal aorta nontender and not dilated by palpation. Back: no CVA tenderness Pulses 2+ Extremities:  Trace ankle edema bilaterally;no clubbing cyanosis, Homan's sign negative  Neurologic: grossly nonfocal Psychologic: normal affect and mood.  ECG: Sinus rhythm with sinus bradycardia 55 beats per minute. Right bundle branch block.  LABS:  BMET    Component Value Date/Time   NA 137 01/25/2011 2357   K 4.4 01/25/2011 2357   CL 105 01/25/2011 2357   CO2 26 01/25/2011 2357   GLUCOSE 97 01/25/2011 2357   BUN 16 01/25/2011 2357   CREATININE 1.25 01/25/2011 2357   CALCIUM 8.4 01/25/2011 2357   GFRNONAA 58* 01/25/2011 2357   GFRAA 68* 01/25/2011 2357     Hepatic Function Panel     Component Value Date/Time   PROT 6.7 08/06/2009 1601   ALBUMIN 4.1 08/06/2009 1601   AST 19 08/06/2009 1601   ALT 17 08/06/2009 1601   ALKPHOS 81 08/06/2009 1601   BILITOT 0.6 08/06/2009 1601     CBC    Component Value Date/Time   WBC 7.4 01/25/2011 2357  RBC 2.98* 01/25/2011 2357   HGB 9.8* 01/25/2011 2357   HCT 28.0* 01/25/2011 2357   PLT 120* 01/25/2011 2357   MCV 94.0 01/25/2011 2357   MCH 32.9  01/25/2011 2357   MCHC 35.0 01/25/2011 2357   RDW 12.5 01/25/2011 2357   LYMPHSABS 1.5 08/06/2009 1601   MONOABS 0.6 08/06/2009 1601   EOSABS 0.1 08/06/2009 1601   BASOSABS 0.0 08/06/2009 1601     BNP No results found for this basename: probnp    Lipid Panel  No results found for this basename: chol, trig, hdl, cholhdl, vldl, ldlcalc     RADIOLOGY: No results found.    ASSESSMENT AND PLAN: A long discussion with [name] reviewed his extensive medical history. He has 4 volumes of charts.  He is 30 years status post his initial CABG revascularization surgery done in [place] and 18 years status post his second CABG surgery. He's had multiple peripheral vascular procedures including carotid endarterectomy, renal stenting, and is documented SFA occlusion. His last catheterization intervention was done in 2012 at which time he had diffuse disease in the vein graft supplying the marginal vessel and had insertion of 2 additional Taxus stents ostially extending to the proximal portion of the recently placed mid prior stent. He hasn't known occluded graft to the right carotid artery with a mid RCA occlusion with left to right collaterals. He also has subclavian steal. He does have mild hypokinesis inferolaterally in the basal inferior wall with an EF of 45% at that catheterization. He does have chronic right bundle branch block. He has not been demonstrated to have an abdominal aortic aneurysm. He does note mild chest anginal symptomatology typically when he starts exercise and particularly after eating. He does have potential areas of myocardium which may be responsible for ischemia. I am electing to add Ranexa 500 mg twice a day to his medical regimen and if symptoms persist this could further be titrated to 1000 mg daily. His blood pressure is currently controlled on his losartan and atenolol amlodipine combination. He is on Mevacor for hyperlipidemia. He continues to be dual antiplatelet therapy  for his stents and peripheral vascular disease. I am recommending laboratory be checked including a CBC, CMP, TSH, and NMR profile. Adjustments will be made to his medical regimen if necessary. I will see him in 4 months for followup evaluation.   Time spent:45 min  Troy Sine, MD, [facility]  03/06/2013 2:52 PM

## 2013-03-06 ENCOUNTER — Encounter: Payer: Self-pay | Admitting: Cardiovascular Disease

## 2013-03-06 DIAGNOSIS — I779 Disorder of arteries and arterioles, unspecified: Secondary | ICD-10-CM | POA: Insufficient documentation

## 2013-03-13 ENCOUNTER — Encounter: Payer: Self-pay | Admitting: Cardiovascular Disease

## 2013-03-14 ENCOUNTER — Encounter: Payer: Self-pay | Admitting: Cardiovascular Disease

## 2013-04-01 ENCOUNTER — Other Ambulatory Visit: Payer: Self-pay | Admitting: Internal Medicine

## 2013-04-03 ENCOUNTER — Other Ambulatory Visit: Payer: Self-pay | Admitting: Internal Medicine

## 2013-04-08 ENCOUNTER — Telehealth: Payer: Self-pay | Admitting: *Deleted

## 2013-04-08 NOTE — Telephone Encounter (Signed)
Pt was wanting a copy of his blood work from December.   TK

## 2013-04-11 NOTE — Telephone Encounter (Signed)
Returned call and pt verified x 2.  Pt informed message received and results were released in [place].  Pt stated he was not able to view them or comments.  Pt informed, per result note, that labs looked good.  Pt also informed Medical Records will be notified to mail him a copy of his results for his records.  Pt verbalized understanding and agreed w/ plan.  Pt informed paper copies are not automatically sent and he must request them each time.  Pt verbalized understanding and agreed w/ plan.  Message forwarded to Medical Records to mail results.

## 2013-04-11 NOTE — Telephone Encounter (Signed)
Please call or mail him his lab results from December.

## 2013-06-11 ENCOUNTER — Other Ambulatory Visit: Payer: Self-pay | Admitting: *Deleted

## 2013-06-11 ENCOUNTER — Telehealth: Payer: Self-pay | Admitting: Cardiovascular Disease

## 2013-06-11 MED ORDER — LOSARTAN POTASSIUM 50 MG PO TABS: 50.0000 mg | ORAL_TABLET | Freq: Every day | ORAL | Status: DC

## 2013-06-11 NOTE — Telephone Encounter (Signed)
Refill(s) sent to pharmacy w/ message that RW retired and to send hard copy fax for refills.

## 2013-06-11 NOTE — Telephone Encounter (Signed)
Calling because they have fax over a refill for the losartan and have not gotten it back .Marland Kitchen They have refaxed the request again . [name] is completely out of medication.. Please Call   Thanks

## 2013-06-11 NOTE — Telephone Encounter (Signed)
Rx refill sent to patients pharmacy  

## 2013-06-11 NOTE — Telephone Encounter (Signed)
No refill request received

## 2013-06-17 ENCOUNTER — Other Ambulatory Visit: Payer: Self-pay

## 2013-06-17 MED ORDER — CLOPIDOGREL BISULFATE 75 MG PO TABS: 75.0000 mg | ORAL_TABLET | Freq: Every day | ORAL | Status: DC

## 2013-06-17 NOTE — Telephone Encounter (Signed)
Rx was sent to pharmacy electronically. 

## 2013-06-26 ENCOUNTER — Telehealth: Payer: Self-pay | Admitting: Cardiovascular Disease

## 2013-06-26 MED ORDER — AMLODIPINE BESYLATE 2.5 MG PO TABS: 2.5000 mg | ORAL_TABLET | Freq: Two times a day (BID) | ORAL | Status: DC

## 2013-06-26 MED ORDER — ATENOLOL 25 MG PO TABS: 25.0000 mg | ORAL_TABLET | Freq: Two times a day (BID) | ORAL | Status: DC

## 2013-06-26 NOTE — Telephone Encounter (Signed)
Rx was sent to pharmacy electronically. 

## 2013-06-26 NOTE — Telephone Encounter (Signed)
Pt still have not gotten his Atenolol and Norvasc. Would you please call it in today. Please call to [phone]

## 2013-07-09 ENCOUNTER — Other Ambulatory Visit: Payer: Self-pay | Admitting: *Deleted

## 2013-07-09 MED ORDER — LOVASTATIN 40 MG PO TABS: 40.0000 mg | ORAL_TABLET | Freq: Every day | ORAL | Status: DC

## 2013-07-09 NOTE — Telephone Encounter (Signed)
Rx refill sent to patient pharmacy   

## 2013-07-15 ENCOUNTER — Telehealth: Payer: Self-pay | Admitting: Cardiovascular Disease

## 2013-07-17 ENCOUNTER — Ambulatory Visit (INDEPENDENT_AMBULATORY_CARE_PROVIDER_SITE_OTHER): Payer: 59 | Admitting: Cardiovascular Disease

## 2013-07-17 ENCOUNTER — Encounter: Payer: Self-pay | Admitting: Cardiovascular Disease

## 2013-07-17 VITALS — BP 126/60 | HR 58 | Ht 65.0 in | Wt 142.3 lb

## 2013-07-17 DIAGNOSIS — I2510 Atherosclerotic heart disease of native coronary artery without angina pectoris: Secondary | ICD-10-CM

## 2013-07-17 DIAGNOSIS — E785 Hyperlipidemia, unspecified: Secondary | ICD-10-CM

## 2013-07-17 DIAGNOSIS — D649 Anemia, unspecified: Secondary | ICD-10-CM

## 2013-07-17 DIAGNOSIS — I10 Essential (primary) hypertension: Secondary | ICD-10-CM

## 2013-07-17 DIAGNOSIS — I739 Peripheral vascular disease, unspecified: Secondary | ICD-10-CM

## 2013-07-17 DIAGNOSIS — I452 Bifascicular block: Secondary | ICD-10-CM

## 2013-07-17 DIAGNOSIS — I779 Disorder of arteries and arterioles, unspecified: Secondary | ICD-10-CM

## 2013-07-17 MED ORDER — LOSARTAN POTASSIUM-HCTZ 50-12.5 MG PO TABS: 1.0000 | ORAL_TABLET | Freq: Every day | ORAL | Status: DC

## 2013-07-17 MED ORDER — TORSEMIDE 20 MG PO TABS: 20.0000 mg | ORAL_TABLET | ORAL | Status: DC | PRN

## 2013-07-17 NOTE — Progress Notes (Signed)
Patient ID: [name], male   DOB: 1945/11/11, 68 y.o.   MRN: [id number]     HPI: [name] is a 68 y.o. male who is a former patient of Dr. Rollene Fare. He does care with me in December 2014 and presents to the office today for six-month cardiology followup evaluation.  [name] has an extensive cardiac and peripheral vascular history. He underwent initial CABG revascularization surgery in [place] in 1984. In 1996 he required re-do CABG revascularization surgery after all grafts were found to be occluded. At that time he also underwent right carotid endarterectomy. In 1998 he underwent stenting of his left main coronary artery and also had stenting done to the vein graft supplying the marginal vessel. He has history of right renal artery stenosis and underwent initial stenting with predilatation 2005. He has documented chronic right bundle branch block. He also significant peripheral vascular disease with bilateral SFA occlusion. He has known left subclavian artery occlusion with right subclavian stenosis. Has a history of hypertension, hyperlipidemia, and remotely smoked cigarettes. His last catheterization was done in November 2012 which showed significant native CAD with evidence for pain left main stent. He had an occluded circumflex marginal vessel from the AV groove circumflex. There was 80% ostial RCA stenosis with 90% stenosis in the mid right coronary artery proximal to the RV marginal branch and total occlusion of the mid RCA. An occluded vein graft supplying the right coronary artery but evidence for collateralization to the distal right artery the left coronary injection and injection of the grafts. The vein supplying the marginal vessel with the previously placed ostial proximal Taxus stent had in-stent narrowing of 50% followed by 95% stenosis in the body of the proximal third of the vessel graft prior to the previously placed mid grafts 3.0x20 mm Taxus stent. At that time, he  underwent difficult but successful intervention involving long segmental stenosis of the vein graft supplying the circumflex marginal vessel. Additional tandem 3.0x38 mm and 3.0x8 mm Taxus stents were inserted with excellent angiographic result. Since then, he has noticed significant improvement of his chest pain but he still notes occasional episodes of angina typically with the initiation of activity and oftentimes this improves after he "warms up." He is participating in cardiac rehabilitation at [facility].  He saw Dr. Rollene Fare in August 2014 per at that time due to concern for progressive carotid stenoses repeat Doppler studies were done and he also saw Dr. Sherren Mocha early for followup evaluation. Repeat imaging was performed which showed a patent right carotid endarterectomy site the Doppler velocity suggestive of 40 given ice percent stenosis of the bilateral proximal internal carotid arteries. There was left subclavian steal noted with retrograde left vertebral artery flow, monophasic left subclavian artery flow and significant difference in the bilateral brachial pressures.  Since I saw him in December, he has continued to remain stable from a cardiac standpoint.  He is participating in cardiac rehabilitation at [facility] on Monday, Tuesday, and Wednesdays, and has been doing so for 9-1/2 years.  He does note some vague palpitations when he is on a stationary bike for long periods of time and gets his heart rate above 92.  He denies any chest tightness.  He does note some mild ankle swelling.  His last renal Doppler study was in August 2014.  He tells me he will be seeing Dr. early for one year of evaluation in the fall and a repeat carotid Doppler study will be done at  that time.  He has not had a stress test since 2011 and his his last cardiac intervention was in 2012.  Past Medical History  Diagnosis Date  . Coronary artery disease 01/24/11    Successful PCI long segmental stensois   vein graft to the CX marginal vessel-graft previously stented prox. 3.0x48mm TAXUS stent mid seg 3.0x57mm TAXUS stent now tandem stens of 3.0x55mm & 3.0x26mm placed with the seg. 50,80 & 99% stenosis reduced to 0%  . Hyperlipidemia   . Angina   . Myocardial infarction 1991  . Myocardial infarction 2005    "had 3 heart attacks this year"  . Dysrhythmia     "PAC's and PVC's"  . Bronchitis   . Shortness of breath     "when I had the heart attacks"  . Hiatal hernia   . GERD (gastroesophageal reflux disease)   . Ulcer     "small; Dr. Henrene Pastor found it 02/2010"  . Headache(784.0)   . Chronic back pain   . DDD (degenerative disc disease)   . Bulging discs     "8 of them; thoracic, lumbar, sacral area"  . Blood transfusion   . Anemia   . Complication of anesthesia   . Esophageal dilatation     "have had it done 7 times; last time ~ 2001"  . Subclavian artery stenosis, left   . Colon polyps   . Esophageal stricture   . Pyloric stenosis   . S/P CABG (coronary artery bypass graft) [place]  . Renal artery stenosis   . RBBB     Past Surgical History  Procedure Laterality Date  . Coronary angioplasty with stent placement  01/24/11    "today makes a total of 9 stents"  . Coronary artery bypass graft  1984    CABG X 4  . Coronary artery bypass graft  1996    CABG X 4  . Appendectomy  1953  . Tonsillectomy  1972  . Adenoidectomy      "when I was an infant"  . Retinal detachment surgery  04/2007    right  . Pylondial cyst removal      Allergies  Allergen Reactions  . Sulfonamide Derivatives     REACTION: rash    Current Outpatient Prescriptions  Medication Sig Dispense Refill  . amLODipine (NORVASC) 2.5 MG tablet Take 1 tablet (2.5 mg total) by mouth 2 (two) times daily.  60 tablet  7  . aspirin 81 MG tablet Take 81 mg by mouth daily.      Marland Kitchen atenolol (TENORMIN) 25 MG tablet Take 1 tablet (25 mg total) by mouth 2 (two) times daily.  60  tablet  7  . butalbital-aspirin-caffeine (FIORINAL) 50-325-40 MG per capsule Take 1 capsule by mouth every 4 (four) hours as needed. FOR HEADACHE.       Marland Kitchen clopidogrel (PLAVIX) 75 MG tablet Take 1 tablet (75 mg total) by mouth daily.  30 tablet  8  . Coenzyme Q10 (CO Q-10) 100 MG CAPS Take by mouth.      . lovastatin (MEVACOR) 40 MG tablet Take 1 tablet (40 mg total) by mouth at bedtime.  30 tablet  6  . Multiple Vitamins-Minerals (MULTIVITAMINS THER. W/MINERALS) TABS Take 1 tablet by mouth daily.        Marland Kitchen NEXIUM 40 MG capsule TAKE ONE CAPSULE TWICE A DAY  60 capsule  6  . nitroGLYCERIN (NITROLINGUAL) 0.4 MG/SPRAY spray Place 1 spray  under the tongue every 5 (five) minutes as needed (up to 3 sprays in 15 mins).  4.9 g  0  . ranolazine (RANEXA) 500 MG 12 hr tablet Take 1 tablet (500 mg total) by mouth 2 (two) times daily.  60 tablet  11  . torsemide (DEMADEX) 20 MG tablet Take 1 tablet (20 mg total) by mouth as needed.  30 tablet  6  . vitamin E 200 UNIT capsule Take 200 Units by mouth daily.        Marland Kitchen losartan-hydrochlorothiazide (HYZAAR) 50-12.5 MG per tablet Take 1 tablet by mouth daily.  30 tablet  6   No current facility-administered medications for this visit.    History   Social History  . Marital Status: Married    Spouse Name: N/A    Number of Children: N/A  . Years of Education: N/A   Occupational History  . Not on file.   Social History Main Topics  . Smoking status: Former Smoker -- 1.00 packs/day for 38 years    Types: Cigarettes    Quit date: 02/09/2005  . Smokeless tobacco: Never Used     Comment: "quit smoking cigarrettes 2006"  . Alcohol Use: No  . Drug Use: No  . Sexual Activity: Not on file   Other Topics Concern  . Not on file   Social History Narrative  . No narrative on file   Socially he is married has 2 children 2 stepchildren. He quit tobacco in 2000.  Family History  Problem Relation Age of Onset  . Colon cancer Neg Hx   . Breast cancer Paternal  Aunt   . Diabetes Sister   . Ulcers Father   . Heart disease Father   . Heart failure Father   . Heart attack Father   . AAA (abdominal aortic aneurysm) Father   . Stroke Mother   . Hypertension Mother    ROS General: Negative; No fevers, chills, or night sweats;  HEENT: History of a detached retina in his right eye; No changes in  hearing, sinus congestion, difficulty swallowing Pulmonary: Negative; No cough, wheezing, shortness of breath, hemoptysis Cardiovascular: see HPI; rare palpitations at increased heart rate while on a stationary bike;No chest pain, presyncope, syncope, GI: Negative; No nausea, vomiting, diarrhea, or abdominal pain GU: Negative; No dysuria, hematuria, or difficulty voiding Musculoskeletal: Negative; no myalgias, joint pain, or weakness Hematologic/Oncology: Negative; no easy bruising, bleeding Endocrine: Negative; no heat/cold intolerance; no diabetes Neuro: Negative; no changes in balance, headaches Skin: Negative; No rashes or skin lesions Psychiatric: Negative; No behavioral problems, depression Sleep: Negative; No snoring, daytime sleepiness, hypersomnolence, bruxism, restless legs, hypnogognic hallucinations, no cataplexy Other comprehensive 14 point system review is negative.  PE BP 126/60  Pulse 58  Ht 5\' 5"  (1.651 m)  Wt 142 lb 4.8 oz (64.547 kg)  BMI 23.68 kg/m2  General: Alert, oriented, no distress.  Skin: normal turgor, no rashes HEENT: Normocephalic, atraumatic. Pupils round and reactive; sclera anicteric;no lid lag.  Nose without nasal septal hypertrophy Mouth/Parynx benign; Mallinpatti scale 3 Neck: No JVD, bilateral carotid bruits, right greater than left  Lungs: clear to ausculatation and percussion; no wheezing or rales Chest wall: no tenderness to palpitation Heart: RRR, s1 s2 normal with a 2/6 systolic murmur along the left sternal border. No S3 or S4 gallop. No diastolic murmurs.  No S3 or S4 gallop Abdomen: soft, nontender; no  hepatosplenomehaly, BS+; abdominal aorta nontender and not dilated by palpation. Back: no CVA tenderness Pulses 2+ Extremities:  Trace ankle edema bilaterally;no clubbing cyanosis, Homan's sign negative  Neurologic: grossly nonfocal Psychologic: normal affect and mood.  ECG (independently read by me in (sinus rhythm at 58 beats per minute.  Right bundle branch block.  Prior December 2014 ECG: Sinus rhythm with sinus bradycardia 55 beats per minute. Right bundle branch block.  LABS:  BMET    Component Value Date/Time   NA 137 01/25/2011 2357   K 4.4 01/25/2011 2357   CL 105 01/25/2011 2357   CO2 26 01/25/2011 2357   GLUCOSE 97 01/25/2011 2357   BUN 16 01/25/2011 2357   CREATININE 1.25 01/25/2011 2357   CALCIUM 8.4 01/25/2011 2357   GFRNONAA 58* 01/25/2011 2357   GFRAA 68* 01/25/2011 2357     Hepatic Function Panel     Component Value Date/Time   PROT 6.7 08/06/2009 1601   ALBUMIN 4.1 08/06/2009 1601   AST 19 08/06/2009 1601   ALT 17 08/06/2009 1601   ALKPHOS 81 08/06/2009 1601   BILITOT 0.6 08/06/2009 1601     CBC    Component Value Date/Time   WBC 7.4 01/25/2011 2357   RBC 2.98* 01/25/2011 2357   HGB 9.8* 01/25/2011 2357   HCT 28.0* 01/25/2011 2357   PLT 120* 01/25/2011 2357   MCV 94.0 01/25/2011 2357   MCH 32.9 01/25/2011 2357   MCHC 35.0 01/25/2011 2357   RDW 12.5 01/25/2011 2357   LYMPHSABS 1.5 08/06/2009 1601   MONOABS 0.6 08/06/2009 1601   EOSABS 0.1 08/06/2009 1601   BASOSABS 0.0 08/06/2009 1601     BNP No results found for this basename: probnp    Lipid Panel  No results found for this basename: chol,  trig,  hdl,  cholhdl,  vldl,  ldlcalc     RADIOLOGY: No results found.    ASSESSMENT AND PLAN: [name] is a 68 year old gentleman with extensive artery and peripheral vascular disease history, who is now 31 years status post his initial CABG revascularization surgery done in [place] and 19 years status post his second CABG surgery. He's had  multiple peripheral vascular procedures including carotid endarterectomy, renal stenting, and is documented SFA occlusion. His last catheterization intervention was done in 2012 at which time he had diffuse disease in the vein graft supplying the marginal vessel and had insertion of 2 additional Taxus stents ostially extending to the proximal portion of the recently placed mid prior stent. He has a known occluded graft to the RCA with a mid RCA occlusion with left to right collaterals. He also has subclavian steal. He does have mild hypokinesis inferolaterally in the basal inferior wall with an EF of 45% at that catheterization. He does have chronic right bundle branch block. He has not been demonstrated to have an abdominal aortic aneurysm.  I last saw him, he was .  The mild chest anginal symptomatology typically when he starts exercise and particularly after eating. He does have potential areas of myocardium which may be responsible for ischemia.  At that time, I added  Ranexa 500 mg twice a day to his medical regimen.  This has resulted in resolution of his prior chest pain.  He is still at the present dose of 500 twice a day.  He underwent a complete set of laboratory and I reviewed this in detail with him.  His lipid status is excellent with an LDL particle number of 723, LDL cholesterol 67, total cholesterol 132, triglycerides 50 HDL 55.  He does have mild ankle edema, which  he states, is present almost every day.  I am changing his losartan 50 mg to losartan HCT 50/12.5 mg to be helpful both for blood pressure control, as well as ankle edema.  He also has a prescription for torsemide which has run out, but he only rarely takes this in the event  ignificant further edema develops.  We did renew this today.  He will be seeing Dr. early, later this year, and carotid studies will be repeated.  In 2016, I am recommending he undergo followup renal duplex imaging.  Also, obtain a followup nuclear perfusion study to  further evaluate.  Scar/ischemia.  I will see him back in the office in January/February 2016 for followup evaluation. Time spent:45 min  Troy Sine, MD, [facility]  07/17/2013 2:01 PM

## 2013-07-17 NOTE — Patient Instructions (Signed)
Your physician has requested that you have a renal artery duplex. During this test, an ultrasound is used to evaluate blood flow to the kidneys. Allow one hour for this exam. Do not eat after midnight the day before and avoid carbonated beverages. Take your medications as you usually do. This will be done in 6 months.  Your physician has requested that you have a lexiscan myoview. For further information please visit [email or web address]. Please follow instruction sheet, as given. This will be done in 6 months.  Your physician has recommended you make the following change in your medication: STOP the losartan. Begin new prescription for losartan-hct.  This has already been sent to the pharmacy.  Your physician recommends that you return for lab work in: 1 week.  Your physician recommends that you schedule a follow-up appointment in: 6 months.

## 2013-07-25 ENCOUNTER — Telehealth (HOSPITAL_COMMUNITY): Payer: Self-pay

## 2013-07-25 NOTE — Telephone Encounter (Signed)
Closed encounter °

## 2013-07-30 ENCOUNTER — Ambulatory Visit (HOSPITAL_COMMUNITY)
Admission: RE | Admit: 2013-07-30 | Discharge: 2013-07-30 | Disposition: A | Discharge status: Home / Self Care | Payer: Medicare Other | Source: Ambulatory Visit | Attending: Cardiovascular Disease | Admitting: Cardiovascular Disease

## 2013-07-30 DIAGNOSIS — R0609 Other forms of dyspnea: Secondary | ICD-10-CM | POA: Diagnosis not present

## 2013-07-30 DIAGNOSIS — R002 Palpitations: Secondary | ICD-10-CM | POA: Diagnosis not present

## 2013-07-30 DIAGNOSIS — R0602 Shortness of breath: Secondary | ICD-10-CM | POA: Diagnosis not present

## 2013-07-30 DIAGNOSIS — R0989 Other specified symptoms and signs involving the circulatory and respiratory systems: Principal | ICD-10-CM | POA: Insufficient documentation

## 2013-07-30 DIAGNOSIS — I2510 Atherosclerotic heart disease of native coronary artery without angina pectoris: Secondary | ICD-10-CM

## 2013-07-30 MED ORDER — TECHNETIUM TC 99M SESTAMIBI GENERIC - CARDIOLITE
30.7000 | Freq: Once | INTRAVENOUS | Status: AC | PRN
  Administered 2013-07-30: 30.7 via INTRAVENOUS

## 2013-07-30 MED ORDER — AMINOPHYLLINE 25 MG/ML IV SOLN
75.0000 mg | Freq: Once | INTRAVENOUS | Status: AC
  Administered 2013-07-30: 75 mg via INTRAVENOUS

## 2013-07-30 MED ORDER — REGADENOSON 0.4 MG/5ML IV SOLN
0.4000 mg | Freq: Once | INTRAVENOUS | Status: AC
  Administered 2013-07-30: 0.4 mg via INTRAVENOUS

## 2013-07-30 MED ORDER — TECHNETIUM TC 99M SESTAMIBI GENERIC - CARDIOLITE
10.3000 | Freq: Once | INTRAVENOUS | Status: AC | PRN
  Administered 2013-07-30: 10 via INTRAVENOUS

## 2013-07-30 NOTE — Procedures (Addendum)
[facility] [address] [place] [id number]  Cardiology Nuclear Med Study  [name] is a 68 y.o. male     MRN : [id number]     DOB: Jul 06, 1945  Procedure Date: 07/30/2013  Nuclear Med Background Indication for Stress Test:  Graft Patency and Stent Patency History:  COPD and chronic bronchitis;CAD;MI-1991,2005;CABG [id number] AND 1996;STENT/PTCA-2012;Last NUC MPI on 06/11/2009-nonischemic;EF=64% Cardiac Risk Factors: Carotid Disease, Family History - CAD, History of Smoking, Hypertension, Lipids, PVD and RBBB  Symptoms:  DOE, Palpitations and SOB   Nuclear Pre-Procedure Caffeine/Decaff Intake:  9:00pm NPO After: 7:00am   IV Site: R Forearm  IV 0.9% NS with Angio Cath:  22g  Chest Size (in):  36"  IV Started by: Rolene Course, RN  Height: 5\' 5"  (1.651 m)  Cup Size: n/a  BMI:  Body mass index is 23.63 kg/(m^2). Weight:  142 lb (64.411 kg)   Tech Comments:  n/a    Nuclear Med Study 1 or 2 day study: 1 day  Stress Test Type:  [place] Provider:  Shelva Majestic, MD   Resting Radionuclide: Technetium 25m Sestamibi  Resting Radionuclide Dose: 10.3 mCi   Stress Radionuclide:  Technetium 71m Sestamibi  Stress Radionuclide Dose: 30.7 mCi           Stress Protocol Rest HR: 59 Stress HR: 69  Rest BP: 132/68 Stress BP: 134/73  Exercise Time (min): n/a METS: n/a   Predicted Max HR: 151 bpm % Max HR: 49.67 bpm Rate Pressure Product: 10500  Dose of Adenosine (mg):  n/a Dose of Lexiscan: 0.4 mg  Dose of Atropine (mg): n/a Dose of Dobutamine: n/a mcg/kg/min (at max HR)  Stress Test Technologist: Leane Para, CCT Nuclear Technologist: Imagene Riches, CNMT   Rest Procedure:  Myocardial perfusion imaging was performed at rest 45 minutes following the intravenous administration of Technetium 70m Sestamibi. Stress Procedure:  The patient received IV Lexiscan 0.4 mg over 15-seconds.  Technetium 65m  Sestamibi injected Iv at 30-seconds.  Patient experienced SOB and 75 mg Aminophylline IV was administered at 5 minutes. There were no significant changes with Lexiscan.  Quantitative spect images were obtained after a 45 minute delay.  Transient Ischemic Dilatation (Normal <1.22):  1.03 Lung/Heart Ratio (Normal <0.45):  0.33 QGS EDV:  80 ml QGS ESV:  32 ml LV Ejection Fraction: 60%       Rest ECG: NSR - Normal EKG  Stress ECG: No significant change from baseline ECG  QPS Raw Data Images:  Normal; no motion artifact; normal heart/lung ratio. Stress Images:  There is decreased uptake in the inferior wall. Rest Images:  Normal homogeneous uptake in all areas of the myocardium. Subtraction (SDS):  These findings are consistent with ischemia.  Impression Exercise Capacity:  Lexiscan with no exercise. BP Response:  Normal blood pressure response. Clinical Symptoms:  No significant symptoms noted. ECG Impression:  No significant ST segment change suggestive of ischemia. Comparison with Prior Nuclear Study: Ischemia new since prior study   Overall Impression:  Intermediate risk stress nuclear study Mild inferobasal ischemia. ROV with Dr. Claiborne Billings.  LV Wall Motion:  NL LV Function; NL Wall Motion   [name], MD  07/30/2013 5:20 PM

## 2013-08-01 ENCOUNTER — Ambulatory Visit (HOSPITAL_COMMUNITY)
Admission: RE | Admit: 2013-08-01 | Discharge: 2013-08-01 | Disposition: A | Discharge status: Home / Self Care | Payer: Medicare Other | Source: Ambulatory Visit | Attending: Cardiovascular Disease | Admitting: Cardiovascular Disease

## 2013-08-01 ENCOUNTER — Telehealth: Payer: Self-pay | Admitting: *Deleted

## 2013-08-01 ENCOUNTER — Encounter (HOSPITAL_COMMUNITY): Payer: 59

## 2013-08-01 DIAGNOSIS — I701 Atherosclerosis of renal artery: Secondary | ICD-10-CM | POA: Insufficient documentation

## 2013-08-01 DIAGNOSIS — I10 Essential (primary) hypertension: Secondary | ICD-10-CM | POA: Diagnosis not present

## 2013-08-01 NOTE — Telephone Encounter (Signed)
Patient is here at the office,after having a doppler testing done.Patient states he left a message on Monday when he was here for another test but has not heard anything back. He states he wanted to know what Dr Claiborne Billings wanted him to do in regards his medication LOSARTAN-HCTZ ,since he lab work drawn last week. Patient states since he has been taking Losartan- hctz and it seems to increase his heart rate ,cause fatigue and excessive sweating.Patient states he would like to switch back to plain Losartan.  RN apologized and informed patient that Dr Claiborne Billings has not present in the office this week

## 2013-08-01 NOTE — Telephone Encounter (Signed)
Continuation. RN spoke with patient and wife. RN asked patient what was his heart rate is running. He states it had been running around pulse 57 prior to the medication change and now pulse 67-69.He states this is what he gets at the maintenance cardiac rehab. Blood pressure 107-127/50's. Patient states he stopped taking Losartan-hctz about 7 days ago and restarted taking plain Losartan. Patient states since he switch he feels better,does not fill as fatigue,and not sweating as much.  RN ask what has heart rate been,patient states pulse 62.  Patient asked if Dr Claiborne Billings received lab work from last week. RN informed patient ,she was not sure , but if patient had signed up for [facility] should be able to view the results. Patient states he is but unable to see the labs. Patient states he gets his lab drawn at [facility]. RN informed patient that the labs are scanned into computer for Dr Claiborne Billings to comment on them. RN reviewed CHL CHART.Results were not present. RN informed patient.  RN called [facility] to have them fax a copy. Copy faxed. Patient request a copy. Copy printed an given to patient. Copy scan into computer for Dr Claiborne Billings review and patient aware.  RN informed patient,unable to tell him to anything different with medication , continue until Dr Claiborne Billings has a decision. Defer to DR Claiborne Billings,

## 2013-08-01 NOTE — Progress Notes (Signed)
Renal Duplex Completed. Kassandra Meriweather, BS, RDMS, RVT  

## 2013-08-08 ENCOUNTER — Ambulatory Visit: Payer: Medicare Other | Admitting: Cardiovascular Disease

## 2013-08-09 NOTE — Telephone Encounter (Signed)
Have not seen labs / were they scanned?

## 2013-08-12 NOTE — Telephone Encounter (Signed)
FORWARD TO DR Claiborne Billings TO REVIEW NOTES CONCERNING MEDICATION

## 2013-08-12 NOTE — Telephone Encounter (Signed)
FORWARD TO Dr Arnette Norris CMA  LAB RESULTS FROM 07/24/13 HAVE BEEN SCANNED FOR YOYR REVIEW

## 2013-08-12 NOTE — Telephone Encounter (Signed)
Cr 1.4; NMR reviewed good

## 2013-08-20 NOTE — Telephone Encounter (Signed)
Dr. Ria Bush is waiting for you to address the medication portion of this message.

## 2013-08-27 NOTE — Telephone Encounter (Signed)
Reviewed with Dr. Claiborne Billings, Labs ok, would like to repeat BMET in 3 months.  Pt has RAS, on losartan

## 2013-08-30 ENCOUNTER — Other Ambulatory Visit: Payer: Self-pay | Admitting: *Deleted

## 2013-08-30 DIAGNOSIS — Z79899 Other long term (current) drug therapy: Secondary | ICD-10-CM

## 2013-09-02 ENCOUNTER — Other Ambulatory Visit: Payer: Self-pay | Admitting: *Deleted

## 2013-09-02 MED ORDER — LOSARTAN POTASSIUM 50 MG PO TABS: 50.0000 mg | ORAL_TABLET | Freq: Every day | ORAL | Status: DC

## 2013-09-02 NOTE — Telephone Encounter (Signed)
Spoke with [name] patient about patient changing Losartan-HCTZ back to Losartan, patient states he feels much better without the HCTZ and goes to Cardiac rehab 3 days a week where they check his pressure. Kristin revied labs and stated that would be fine as long as he monitors BP and calls [place] if there are any major changes. Patient voiced understanding and Losartan was refilled to pharmacy.

## 2013-09-19 ENCOUNTER — Other Ambulatory Visit: Payer: Self-pay | Admitting: *Deleted

## 2013-09-19 DIAGNOSIS — I739 Peripheral vascular disease, unspecified: Secondary | ICD-10-CM

## 2013-09-19 DIAGNOSIS — R5381 Other malaise: Secondary | ICD-10-CM

## 2013-09-19 DIAGNOSIS — E782 Mixed hyperlipidemia: Secondary | ICD-10-CM

## 2013-09-19 DIAGNOSIS — Z95828 Presence of other vascular implants and grafts: Secondary | ICD-10-CM

## 2013-09-19 DIAGNOSIS — Z79899 Other long term (current) drug therapy: Secondary | ICD-10-CM

## 2013-09-19 DIAGNOSIS — I209 Angina pectoris, unspecified: Secondary | ICD-10-CM

## 2013-09-19 DIAGNOSIS — R5383 Other fatigue: Secondary | ICD-10-CM

## 2013-09-19 MED ORDER — CLOPIDOGREL BISULFATE 75 MG PO TABS
75.0000 mg | ORAL_TABLET | Freq: Every day | ORAL | Status: DC
Start: 2013-09-19 — End: 2014-03-12

## 2013-09-19 MED ORDER — AMLODIPINE BESYLATE 2.5 MG PO TABS: 2.5000 mg | ORAL_TABLET | Freq: Two times a day (BID) | ORAL | Status: DC

## 2013-09-19 MED ORDER — RANOLAZINE ER 500 MG PO TB12: 500.0000 mg | ORAL_TABLET | Freq: Two times a day (BID) | ORAL | Status: DC

## 2013-09-19 MED ORDER — LOVASTATIN 40 MG PO TABS: 40.0000 mg | ORAL_TABLET | Freq: Every day | ORAL | Status: DC

## 2013-09-19 MED ORDER — TORSEMIDE 20 MG PO TABS: 20.0000 mg | ORAL_TABLET | ORAL | Status: DC | PRN

## 2013-09-19 MED ORDER — ATENOLOL 25 MG PO TABS
25.0000 mg | ORAL_TABLET | Freq: Two times a day (BID) | ORAL | Status: DC
Start: 2013-09-19 — End: 2014-01-14

## 2013-09-19 NOTE — Telephone Encounter (Signed)
Rx refills sent in for 90 day supply per patient request.

## 2013-09-19 NOTE — Telephone Encounter (Signed)
Encounter complete. 

## 2013-09-25 ENCOUNTER — Telehealth: Payer: Self-pay

## 2013-09-25 MED ORDER — ESOMEPRAZOLE MAGNESIUM 40 MG PO CPDR: 40.0000 mg | DELAYED_RELEASE_CAPSULE | Freq: Two times a day (BID) | ORAL | Status: DC

## 2013-09-25 NOTE — Telephone Encounter (Signed)
Sent Nexium 90 day supply.  Patient needs office visit for further refills

## 2013-10-17 ENCOUNTER — Encounter: Payer: Self-pay | Admitting: *Deleted

## 2013-10-17 ENCOUNTER — Other Ambulatory Visit: Payer: Self-pay | Admitting: *Deleted

## 2013-10-17 DIAGNOSIS — Z79899 Other long term (current) drug therapy: Secondary | ICD-10-CM

## 2013-11-05 ENCOUNTER — Encounter: Payer: Self-pay | Admitting: *Deleted

## 2013-11-06 ENCOUNTER — Encounter: Payer: Self-pay | Admitting: *Deleted

## 2013-11-19 ENCOUNTER — Encounter: Payer: Self-pay | Admitting: Internal Medicine

## 2013-12-09 ENCOUNTER — Encounter: Payer: Self-pay | Admitting: Family

## 2013-12-10 ENCOUNTER — Ambulatory Visit (HOSPITAL_COMMUNITY)
Admission: RE | Admit: 2013-12-10 | Discharge: 2013-12-10 | Disposition: A | Discharge status: Home / Self Care | Payer: Medicare Other | Source: Ambulatory Visit | Attending: Family | Admitting: Family

## 2013-12-10 ENCOUNTER — Encounter: Payer: Self-pay | Admitting: Family

## 2013-12-10 ENCOUNTER — Ambulatory Visit (INDEPENDENT_AMBULATORY_CARE_PROVIDER_SITE_OTHER): Payer: Medicare Other | Admitting: Family

## 2013-12-10 VITALS — BP 90/62 | HR 54 | Resp 14 | Ht 65.0 in | Wt 143.0 lb

## 2013-12-10 DIAGNOSIS — I63239 Cerebral infarction due to unspecified occlusion or stenosis of unspecified carotid arteries: Secondary | ICD-10-CM | POA: Insufficient documentation

## 2013-12-10 DIAGNOSIS — I6521 Occlusion and stenosis of right carotid artery: Secondary | ICD-10-CM

## 2013-12-10 DIAGNOSIS — I6529 Occlusion and stenosis of unspecified carotid artery: Secondary | ICD-10-CM | POA: Insufficient documentation

## 2013-12-10 DIAGNOSIS — I639 Cerebral infarction, unspecified: Secondary | ICD-10-CM

## 2013-12-10 DIAGNOSIS — Z48812 Encounter for surgical aftercare following surgery on the circulatory system: Secondary | ICD-10-CM

## 2013-12-10 NOTE — Progress Notes (Signed)
Established Carotid Patient   History of Present Illness  [name] is a 68 y.o. male patient of Dr. Donnetta Hutching who is s/p right carotid endarterectomy by Dr. Donnetta Hutching in 1996. He did well since that time. He returns today for carotid artery surveillance and discussion.  He does have a known moderate left carotid stenosis and is being followed with serial ultrasounds. He does have history of significant coronary disease with multiple prior stenting procedures and myocardial infarctions. He is extremely active in a walking program and is comfortable with this. He does have known superficial femoral artery occlusive disease  He denies any history of stroke or TIA, specifically he denies amaurosis fugax or monocular blindness,  denies facial drooping, denies hemiplegia, denies receptive or expressive aphasia.   He reports that when he bends forward slightly he has loss of muscle function of anterior legs that starts in his abdomen accompanied by bilateral pain that starts in both hips and shoots toward his feet. This rarely happens, started 3-4 years ago, is not worsening nor improving.  He also reports that he has known sacral, lumbar, and thoracic "bulging discs", degenerative disc disease, found on CT of his back, per patient. He also did years of heavy lifting as an EMT. He also reports that he has scoliosis.  He does cardiac rehab 3-4 days/week for 10 years, states this has helped his PAD, he no longer has claudication symptoms in his legs with walking.   Pt denies New Medical or Surgical History.  Pt Diabetic: No Pt smoker: former smoker, quit in 2006  Pt meds include: Statin : Yes ASA: Yes Other anticoagulants/antiplatelets: Plavix   Past Medical History  Diagnosis Date  . Coronary artery disease 01/24/11    Successful PCI long segmental stensois  vein graft to the CX marginal vessel-graft previously stented prox. 3.0x82mm TAXUS stent mid seg 3.0x81mm TAXUS stent now tandem stens of  3.0x12mm & 3.0x29mm placed with the seg. 50,80 & 99% stenosis reduced to 0%  . Hyperlipidemia   . Angina   . Myocardial infarction 1991  . Myocardial infarction 2005    "had 3 heart attacks this year"  . Dysrhythmia     "PAC's and PVC's"  . Bronchitis   . Shortness of breath     "when I had the heart attacks"  . Hiatal hernia   . GERD (gastroesophageal reflux disease)   . Ulcer     "small; Dr. Henrene Pastor found it 02/2010"  . Headache(784.0)   . Chronic back pain   . DDD (degenerative disc disease)   . Bulging discs     "8 of them; thoracic, lumbar, sacral area"  . Blood transfusion   . Anemia   . Complication of anesthesia   . Esophageal dilatation     "have had it done 7 times; last time ~ 2001"  . Subclavian artery stenosis, left   . Colon polyps   . Esophageal stricture   . Pyloric stenosis   . S/P CABG (coronary artery bypass graft) [place]  . Renal artery stenosis   . RBBB     Social History History  Substance Use Topics  . Smoking status: Former Smoker -- 1.00 packs/day for 38 years    Types: Cigarettes    Quit date: 02/09/2005  . Smokeless tobacco: Never Used     Comment: "quit smoking cigarrettes 2006"  . Alcohol Use: No    Family History Family History  Problem Relation Age of Onset  . Colon cancer Neg Hx   . Breast cancer Paternal Aunt   . Diabetes Sister   . Ulcers Father   . Heart disease Father   . Heart failure Father   . Heart attack Father   . AAA (abdominal aortic aneurysm) Father   . Stroke Mother   . Hypertension Mother     Surgical History Past Surgical History  Procedure Laterality Date  . Coronary angioplasty with stent placement  01/24/11    "today makes a total of 9 stents"  . Coronary artery bypass graft  1984    CABG X 4  . Coronary artery bypass graft  1996    CABG X 4  . Appendectomy  1953  . Tonsillectomy  1972  . Adenoidectomy      "when I was an infant"  . Retinal detachment  surgery  04/2007    right  . Pylondial cyst removal    . Carotid endarterectomy Right 1996    CE    Allergies  Allergen Reactions  . Sulfonamide Derivatives     REACTION: rash    Current Outpatient Prescriptions  Medication Sig Dispense Refill  . amLODipine (NORVASC) 2.5 MG tablet Take 1 tablet (2.5 mg total) by mouth 2 (two) times daily.  180 tablet  1  . aspirin 81 MG tablet Take 81 mg by mouth daily.      Marland Kitchen atenolol (TENORMIN) 25 MG tablet Take 1 tablet (25 mg total) by mouth 2 (two) times daily.  180 tablet  1  . butalbital-aspirin-caffeine (FIORINAL) 50-325-40 MG per capsule Take 1 capsule by mouth every 4 (four) hours as needed. FOR HEADACHE.       Marland Kitchen clopidogrel (PLAVIX) 75 MG tablet Take 1 tablet (75 mg total) by mouth daily.  90 tablet  1  . Coenzyme Q10 (CO Q-10) 100 MG CAPS Take by mouth.      . esomeprazole (NEXIUM) 40 MG capsule Take 1 capsule (40 mg total) by mouth 2 (two) times daily.  180 capsule  0  . losartan (COZAAR) 50 MG tablet Take 1 tablet (50 mg total) by mouth daily.  90 tablet  1  . lovastatin (MEVACOR) 40 MG tablet Take 1 tablet (40 mg total) by mouth at bedtime.  90 tablet  1  . Multiple Vitamins-Minerals (MULTIVITAMINS THER. W/MINERALS) TABS Take 1 tablet by mouth daily.        . nitroGLYCERIN (NITROLINGUAL) 0.4 MG/SPRAY spray Place 1 spray under the tongue every 5 (five) minutes as needed (up to 3 sprays in 15 mins).  4.9 g  0  . ranolazine (RANEXA) 500 MG 12 hr tablet Take 1 tablet (500 mg total) by mouth 2 (two) times daily.  180 tablet  1  . torsemide (DEMADEX) 20 MG tablet Take 1 tablet (20 mg total) by mouth as needed.  90 tablet  1  . vitamin E 200 UNIT capsule Take 200 Units by mouth daily.         No current facility-administered medications for this visit.    Review of Systems : See HPI for pertinent positives and negatives.  Physical Examination  Filed Vitals:   12/10/13 1400 12/10/13 1402  BP: 107/59 90/62  Pulse: 54 54  Resp:  14   Height:  5\' 5"  (1.651 m)  Weight:  143 lb (64.864 kg)  SpO2:  100%   Body mass index is 23.8 kg/(m^2).  General: WDWN male in NAD GAIT: normal Eyes: Pupils  equal Pulmonary:  Non-labored, CTAB, Negative  Rales, Negative rhonchi, & Negative wheezing.  Cardiac: regular Rhythm ,  Negative detected murmur.  VASCULAR EXAM Carotid Bruits Right Left   Positive Positive    Aorta is palpable. Radial pulses are 2+ right and 1+ left palpable.                                                                                                                            LE Pulses Right Left       FEMORAL  1+ palpable  2+ palpable        POPLITEAL  not palpable   not palpable       POSTERIOR TIBIAL  not palpable   not palpable        DORSALIS PEDIS      ANTERIOR TIBIAL 1+ palpable  2+ palpable     Gastrointestinal: soft, nontender, BS WNL, no r/g,  negative masses palpated.  Musculoskeletal: Negative muscle atrophy/wasting. M/S 5/5 throughout, Extremities without ischemic changes. Trace right and 1+ left pretibial pitting edema.  Neurologic: A&O X 3; Appropriate Affect ; SENSATION ;normal;  Speech is normal CN 2-12 intact, Pain and light touch intact in extremities, Motor exam as listed above.   Non-Invasive Vascular Imaging CAROTID DUPLEX 12/10/2013   CEREBROVASCULAR DUPLEX EVALUATION    INDICATION: Carotid artery disease    PREVIOUS INTERVENTION(S): Right carotid endarterectomy 1996 by Dr. Donnetta Hutching Known left subclavian  occlusive disease    DUPLEX EXAM: Carotid duplex    RIGHT  LEFT  Peak Systolic Velocities (cm/s) End Diastolic Velocities (cm/s) Plaque LOCATION Peak Systolic Velocities (cm/s) End Diastolic Velocities (cm/s) Plaque  199 14 - CCA PROXIMAL 140 17 HT  135 20  - CCA MID 152 22 HT  139 16 HT CCA DISTAL 313 25 HT  167 11 - ECA 239 4 HT  302 59 HT ICA PROXIMAL 273 58 HT  194 39 - ICA MID 120 29 -  108 23 - ICA DISTAL 93 26 -    N/A ICA / CCA Ratio (PSV) 1.7   Antegrade Vertebral Flow Retrograde  [id number] Brachial Systolic Pressure (mmHg) 123XX123  Triphasic Brachial Artery Waveforms M    Plaque Morphology:  HM = Homogeneous, HT = Heterogeneous, CP = Calcific Plaque, SP = Smooth Plaque, IP = Irregular Plaque     ADDITIONAL FINDINGS: Diffuse plaque in the left common carotid artery with increased velocity in the distal common carotid artery    IMPRESSION: 1. Patent right carotid endarterectomy site with velocity in the 40 - 59% stenosis range. 2. Left external carotid artery stenosis. 3. 40 - 59% left internal carotid artery stenosis. 4. Known left subclavian artery steal.    Compared to the previous exam:  No significant change     Assessment: ALEJANDRA LEMMO is a 68 y.o. male who is s/p right carotid endarterectomy in 1996 and has known left subclavian artery steal.  He has no remote nor recent  history of stroke or TIA, he has no steal symptoms. Today's carotid Duplex demonstrates a patent right carotid endarterectomy site with velocity in the 40 - 59% stenosis range, 40 - 59% left internal carotid artery stenosis, left external carotid artery stenosis, and known left subclavian artery steal. The  ICA stenosis is  Unchanged from previous exam.  Pedal pulses are palpable, no tissue loss.  Plan: Follow-up in 1 year with Carotid Duplex scan.   I discussed in depth with the patient the nature of atherosclerosis, and emphasized the importance of maximal medical management including strict control of blood pressure, blood glucose, and lipid levels, obtaining regular exercise, and continued cessation of smoking.  The patient is aware that without maximal medical management the underlying atherosclerotic disease process will progress, limiting the benefit of any interventions. The patient was given information about stroke prevention and what symptoms should prompt the patient to seek immediate medical care. Thank you for allowing [place] to participate in this  patient's care.  Clemon Chambers, RN, MSN, FNP-C [facility] Office: [phone]  Clinic Physician: Early  12/10/2013 2:07 PM

## 2013-12-10 NOTE — Patient Instructions (Addendum)

## 2013-12-11 ENCOUNTER — Other Ambulatory Visit: Payer: Self-pay | Admitting: *Deleted

## 2013-12-11 DIAGNOSIS — I739 Peripheral vascular disease, unspecified: Principal | ICD-10-CM

## 2013-12-11 DIAGNOSIS — I779 Disorder of arteries and arterioles, unspecified: Secondary | ICD-10-CM

## 2013-12-20 ENCOUNTER — Other Ambulatory Visit: Payer: Self-pay

## 2014-01-01 ENCOUNTER — Other Ambulatory Visit (HOSPITAL_COMMUNITY): Payer: Self-pay | Admitting: Cardiovascular Disease

## 2014-01-01 ENCOUNTER — Other Ambulatory Visit: Payer: Self-pay | Admitting: Internal Medicine

## 2014-01-01 DIAGNOSIS — I159 Secondary hypertension, unspecified: Secondary | ICD-10-CM

## 2014-01-03 ENCOUNTER — Encounter (HOSPITAL_COMMUNITY): Payer: Self-pay | Admitting: *Deleted

## 2014-01-14 ENCOUNTER — Ambulatory Visit (INDEPENDENT_AMBULATORY_CARE_PROVIDER_SITE_OTHER): Payer: Medicare Other | Admitting: Cardiovascular Disease

## 2014-01-14 ENCOUNTER — Encounter: Payer: Self-pay | Admitting: Cardiovascular Disease

## 2014-01-14 VITALS — BP 124/60 | HR 62 | Ht 65.0 in | Wt 142.8 lb

## 2014-01-14 DIAGNOSIS — I739 Peripheral vascular disease, unspecified: Secondary | ICD-10-CM

## 2014-01-14 DIAGNOSIS — I2510 Atherosclerotic heart disease of native coronary artery without angina pectoris: Secondary | ICD-10-CM

## 2014-01-14 DIAGNOSIS — I10 Essential (primary) hypertension: Secondary | ICD-10-CM

## 2014-01-14 DIAGNOSIS — I25118 Atherosclerotic heart disease of native coronary artery with other forms of angina pectoris: Secondary | ICD-10-CM

## 2014-01-14 DIAGNOSIS — E785 Hyperlipidemia, unspecified: Secondary | ICD-10-CM

## 2014-01-14 DIAGNOSIS — I6523 Occlusion and stenosis of bilateral carotid arteries: Secondary | ICD-10-CM

## 2014-01-14 MED ORDER — ATENOLOL 25 MG PO TABS
ORAL_TABLET | ORAL | Status: DC
Start: 2014-01-14 — End: 2014-03-12

## 2014-01-14 MED ORDER — RANOLAZINE ER 1000 MG PO TB12
1000.0000 mg | ORAL_TABLET | Freq: Two times a day (BID) | ORAL | Status: DC
Start: 2014-01-14 — End: 2014-01-14

## 2014-01-14 MED ORDER — ATENOLOL 25 MG PO TABS: ORAL_TABLET | ORAL | Status: DC

## 2014-01-14 MED ORDER — RANOLAZINE ER 1000 MG PO TB12
1000.0000 mg | ORAL_TABLET | Freq: Two times a day (BID) | ORAL | Status: DC
Start: 2014-01-14 — End: 2015-02-23

## 2014-01-14 MED ORDER — NITROGLYCERIN 0.4 MG/SPRAY TL SOLN: 1.0000 | Status: DC | PRN

## 2014-01-14 NOTE — Progress Notes (Signed)
Patient ID: [name], male   DOB: [birth year], 68 y.o.   MRN: [id number]     HPI: [name] is a 68 y.o. male who is a former patient of Dr. Rollene Fare. He presents now for a 6 month cardiology evaluation, having last seen me in May 2015.  [name] has an extensive cardiac and peripheral vascular history. He underwent initial CABG revascularization surgery in [place] in 1984. In 1996 he required re-do CABG revascularization surgery after all grafts were found to be occluded. At that time he also underwent right carotid endarterectomy. In 1998 he underwent stenting of his left main coronary artery and also had stenting done to the vein graft supplying the marginal vessel. He has history of right renal artery stenosis and underwent initial stenting with predilatation 2005. He has documented chronic right bundle branch block. He also significant peripheral vascular disease with bilateral SFA occlusion. He has known left subclavian artery occlusion with right subclavian stenosis. Has a history of hypertension, hyperlipidemia, and remotely smoked cigarettes. His last catheterization was done in November 2012 which showed significant native CAD with evidence for patent left main stent. He had an occluded circumflex marginal vessel from the AV groove circumflex. There was 80% ostial RCA stenosis with 90% stenosis in the mid right coronary artery proximal to the RV marginal branch and total occlusion of the mid RCA. An occluded vein graft supplying the right coronary artery but evidence for collateralization to the distal right artery the left coronary injection and injection of the grafts. The vein supplying the marginal vessel with the previously placed ostial proximal Taxus stent had in-stent narrowing of 50% followed by 95% stenosis in the body of the proximal third of the vessel graft prior to the previously placed mid grafts 3.0x20 mm Taxus stent. At that time, he underwent difficult but successful  intervention involving long segmental stenosis of the vein graft supplying the circumflex marginal vessel. Additional tandem 3.0x38 mm and 3.0x8 mm Taxus stents were inserted with excellent angiographic result. Since then, he has noticed significant improvement of his chest pain but he still notes occasional episodes of angina typically with the initiation of activity and oftentimes this improves after he "warms up." He is participating in cardiac [facility] at [facility].  He saw Dr. Rollene Fare in August 2014 per at that time due to concern for progressive carotid stenoses repeat Doppler studies were done and he also saw Dr. Sherren Mocha early for followup evaluation. Repeat imaging was performed which showed a patent right carotid endarterectomy site the Doppler velocity suggestive of 40  percent stenosis of the bilateral proximal internal carotid arteries. There was left subclavian steal noted with retrograde left vertebral artery flow, monophasic left subclavian artery flow and significant difference in the bilateral brachial pressures.  He established cardiology care with me in December 2014 December, he has continued to remain stable from a cardiac standpoint.  He is participating in cardiac rehabilitation at [facility] on Monday, Tuesday, and Wednesdays, and has been doing so for 9-1/2 years.  He underwent a nuclear perfusion study in May 2015.  This demonstrated mild inferobasal ischemia, most likely secondary to RCA distribution.  He admits to occasional episodes of palpitations particularly for his heart rate reaches 90 bpm.  He denies associated presyncope or syncope.  He denies any awareness of sustained runs of tachycardia.  He presents for evaluation.  Past Medical History  Diagnosis Date  . Coronary artery disease 01/24/11    Successful PCI  long segmental stensois  vein graft to the CX marginal vessel-graft previously stented prox. 3.0x52mm TAXUS stent mid seg 3.0x42mm TAXUS stent  now tandem stens of 3.0x35mm & 3.0x47mm placed with the seg. 50,80 & 99% stenosis reduced to 0%  . Hyperlipidemia   . Angina   . Myocardial infarction 1991  . Myocardial infarction 2005    "had 3 heart attacks this year"  . Dysrhythmia     "PAC's and PVC's"  . Bronchitis   . Shortness of breath     "when I had the heart attacks"  . Hiatal hernia   . GERD (gastroesophageal reflux disease)   . Ulcer     "small; Dr. Henrene Pastor found it 02/2010"  . Headache(784.0)   . Chronic back pain   . DDD (degenerative disc disease)   . Bulging discs     "8 of them; thoracic, lumbar, sacral area"  . Blood transfusion   . Anemia   . Complication of anesthesia   . Esophageal dilatation     "have had it done 7 times; last time ~ 2001"  . Subclavian artery stenosis, left   . Colon polyps   . Esophageal stricture   . Pyloric stenosis   . S/P CABG (coronary artery bypass graft) [place]  . Renal artery stenosis   . RBBB     Past Surgical History  Procedure Laterality Date  . Coronary angioplasty with stent placement  01/24/11    "today makes a total of 9 stents"  . Coronary artery bypass graft  1984    CABG X 4  . Coronary artery bypass graft  1996    CABG X 4  . Appendectomy  1953  . Tonsillectomy  1972  . Adenoidectomy      "when I was an infant"  . Retinal detachment surgery  04/2007    right  . Pylondial cyst removal    . Carotid endarterectomy Right 1996    CE    Allergies  Allergen Reactions  . Sulfonamide Derivatives     REACTION: rash    Current Outpatient Prescriptions  Medication Sig Dispense Refill  . amLODipine (NORVASC) 2.5 MG tablet Take 1 tablet (2.5 mg total) by mouth 2 (two) times daily. 180 tablet 1  . aspirin 81 MG tablet Take 81 mg by mouth daily.    Marland Kitchen atenolol (TENORMIN) 25 MG tablet Take 1 & 1/2 tablet in the morning and 1 tablet  At night 225 tablet 3  . butalbital-aspirin-caffeine (FIORINAL) 50-325-40 MG per capsule  Take 1 capsule by mouth every 4 (four) hours as needed. FOR HEADACHE.     Marland Kitchen clopidogrel (PLAVIX) 75 MG tablet Take 1 tablet (75 mg total) by mouth daily. 90 tablet 1  . Coenzyme Q10 (CO Q-10) 100 MG CAPS Take by mouth.    . esomeprazole (NEXIUM) 40 MG capsule TAKE 1 CAPSULE TWICE DAILY 180 capsule 0  . loratadine (CLARITIN) 10 MG tablet Take 1 tablet by mouth daily as needed.  3  . losartan (COZAAR) 50 MG tablet Take 1 tablet (50 mg total) by mouth daily. 90 tablet 1  . lovastatin (MEVACOR) 40 MG tablet Take 1 tablet (40 mg total) by mouth at bedtime. 90 tablet 1  . Multiple Vitamins-Minerals (MULTIVITAMINS THER. W/MINERALS) TABS Take 1 tablet by mouth daily.      . nitroGLYCERIN (NITROLINGUAL) 0.4 MG/SPRAY spray Place 1 spray under the tongue every 5 (five) minutes as needed (  up to 3 sprays in 15 mins). 4.9 g 0  . torsemide (DEMADEX) 20 MG tablet Take 1 tablet (20 mg total) by mouth as needed. 90 tablet 1  . vitamin E 200 UNIT capsule Take 200 Units by mouth daily.      . ranolazine (RANEXA) 1000 MG SR tablet Take 1 tablet (1,000 mg total) by mouth 2 (two) times daily. 180 tablet 3   No current facility-administered medications for this visit.    History   Social History  . Marital Status: Married    Spouse Name: N/A    Number of Children: N/A  . Years of Education: N/A   Occupational History  . Not on file.   Social History Main Topics  . Smoking status: Former Smoker -- 1.00 packs/day for 38 years    Types: Cigarettes    Quit date: 02/09/2005  . Smokeless tobacco: Never Used     Comment: "quit smoking cigarrettes 2006"  . Alcohol Use: No  . Drug Use: No  . Sexual Activity: Not on file   Other Topics Concern  . Not on file   Social History Narrative   Socially he is married has 2 children 2 stepchildren. He quit tobacco in 2000.  Family History  Problem Relation Age of Onset  . Colon cancer Neg Hx   . Breast cancer Paternal Aunt   . Diabetes Sister   . Ulcers Father    . Heart disease Father   . Heart failure Father   . Heart attack Father   . AAA (abdominal aortic aneurysm) Father   . Stroke Mother   . Hypertension Mother    ROS General: Negative; No fevers, chills, or night sweats;  HEENT: History of a detached retina in his right eye; No changes in  hearing, sinus congestion, difficulty swallowing Pulmonary: Negative; No cough, wheezing, shortness of breath, hemoptysis Cardiovascular: see HPI; positive for palpitations at increased heart rate while on a stationary bike;No chest pain, presyncope, syncope, GI: Negative; No nausea, vomiting, diarrhea, or abdominal pain GU: Negative; No dysuria, hematuria, or difficulty voiding Musculoskeletal: Negative; no myalgias, joint pain, or weakness Hematologic/Oncology: Negative; no easy bruising, bleeding Endocrine: Negative; no heat/cold intolerance; no diabetes Neuro: Negative; no changes in balance, headaches Skin: Negative; No rashes or skin lesions Psychiatric: Negative; No behavioral problems, depression Sleep: Negative; No snoring, daytime sleepiness, hypersomnolence, bruxism, restless legs, hypnogognic hallucinations, no cataplexy Other comprehensive 14 point system review is negative.  PE BP 124/60 mmHg  Pulse 62  Ht 5\' 5"  (1.651 m)  Wt 142 lb 12.8 oz (64.774 kg)  BMI 23.76 kg/m2  General: Alert, oriented, no distress.  Skin: normal turgor, no rashes HEENT: Normocephalic, atraumatic. Pupils round and reactive; sclera anicteric;no lid lag.  Nose without nasal septal hypertrophy Mouth/Parynx benign; Mallinpatti scale 3 Neck: No JVD, bilateral carotid bruits, right greater than left  Lungs: clear to ausculatation and percussion; no wheezing or rales Chest wall: no tenderness to palpitation Heart: RRR, s1 s2 normal with a 2/6 systolic murmur along the left sternal border. No S3 or S4 gallop. No diastolic murmurs.  No S3 or S4 gallop Abdomen: soft, nontender; no hepatosplenomehaly, BS+;  abdominal aorta nontender and not dilated by palpation. Back: no CVA tenderness Pulses 2+ Extremities:  Trace ankle edema bilaterally;no clubbing cyanosis, Homan's sign negative  Neurologic: grossly nonfocal Psychologic: normal affect and mood.  ECG (independently read by me): Normal sinus rhythm at 62 bpm.  Right bundle branch block.  Small nondiagnostic inferior Q  waves.  Prior May 2015ECG : sinus rhythm at 58 beats per minute.  Right bundle branch block.  Prior December 2014 ECG: Sinus rhythm with sinus bradycardia 55 beats per minute. Right bundle branch block.  LABS:  BMET    Component Value Date/Time   NA 137 01/25/2011 2357   K 4.4 01/25/2011 2357   CL 105 01/25/2011 2357   CO2 26 01/25/2011 2357   GLUCOSE 97 01/25/2011 2357   BUN 16 01/25/2011 2357   CREATININE 1.25 01/25/2011 2357   CALCIUM 8.4 01/25/2011 2357   GFRNONAA 58* 01/25/2011 2357   GFRAA 68* 01/25/2011 2357     Hepatic Function Panel     Component Value Date/Time   PROT 6.7 08/06/2009 1601   ALBUMIN 4.1 08/06/2009 1601   AST 19 08/06/2009 1601   ALT 17 08/06/2009 1601   ALKPHOS 81 08/06/2009 1601   BILITOT 0.6 08/06/2009 1601     CBC    Component Value Date/Time   WBC 7.4 01/25/2011 2357   RBC 2.98* 01/25/2011 2357   HGB 9.8* 01/25/2011 2357   HCT 28.0* 01/25/2011 2357   PLT 120* 01/25/2011 2357   MCV 94.0 01/25/2011 2357   MCH 32.9 01/25/2011 2357   MCHC 35.0 01/25/2011 2357   RDW 12.5 01/25/2011 2357   LYMPHSABS 1.5 08/06/2009 1601   MONOABS 0.6 08/06/2009 1601   EOSABS 0.1 08/06/2009 1601   BASOSABS 0.0 08/06/2009 1601     BNP No results found for: PROBNP  Lipid Panel  No results found for: CHOL   RADIOLOGY: No results found.    ASSESSMENT AND PLAN: [name] is a 68 year old gentleman with extensive artery and peripheral vascular disease history, who is now 31 years status post his initial CABG revascularization surgery done in [place] and 19 years status post  his second CABG surgery. He's had multiple peripheral vascular procedures including carotid endarterectomy, renal stenting, and is documented SFA occlusion. His last catheterization intervention was done in 2012 which demonstrated diffuse disease in the vein graft supplying the marginal vessel and 2 additional Taxus stents inserted commencing ostially and extending to the proximal portion of the recently placed mid prior stent. He has a known occluded graft to the RCA with a mid RCA occlusion with left to right collaterals. He also has subclavian steal. He does have mild hypokinesis inferolaterally in the basal inferior wall with an EF of 45% at that catheterization. He does have chronic right bundle branch block. He has not been demonstrated to have an abdominal aortic aneurysm. When I had seen him previously he had complained of mild chest anginal symptomatology typically when he starts exercise and particularly after eating. He does have potential areas of myocardium which may be responsible for ischemia.  At that time, I added  Ranexa 500 mg twice a day to his medical regimen.  His chest pain symptoms have improved.  However, he presently develops palpitations when he exceeds heart rates greater than 90.  His most recent nuclear perfusion study which was done in May 2015 showed an EF of 60% and was interpreted as intermediate risk revealing mild inferobasal ischemia.  Presently, I'm recommending further titration of his Ranexa to 1000 mg twice a day.  In light of his palpitations and increased heart rates.  I will further titrate his atenolol to 37.5 mg daily.  He has been taking amlodipine 2.5 mg twice a day and I suggested that he change distal 5 mg pill and take this just once  a day.  I will see him in 3 months for follow-up cartilaginous evaluation or sooner if problems arise.  Time spent:30 min  Troy Sine, MD, [facility]  01/16/2014 8:51 PM

## 2014-01-14 NOTE — Patient Instructions (Signed)
Your physician has recommended you make the following change in your medication: increase the Ranexa to 1000 mg twice a day. Increase the atenolol to 1 & 1/2 tablets in the morning and 1 tablet at night.  New prescriptions have been given to you today to reflect these changes.  Your physician recommends that you schedule a follow-up appointment in: 3 months with Dr. Claiborne Billings.

## 2014-01-16 DIAGNOSIS — E785 Hyperlipidemia, unspecified: Secondary | ICD-10-CM | POA: Insufficient documentation

## 2014-01-21 ENCOUNTER — Telehealth: Payer: Self-pay | Admitting: Internal Medicine

## 2014-01-21 NOTE — Telephone Encounter (Signed)
Pt states he is not able to have BM's without a laxative. Reports there has been some blood and mucous in his stool. Pt scheduled to see Nicoletta Ba PA tomorrow at 2:30pm. Pt aware of appt.

## 2014-01-22 ENCOUNTER — Ambulatory Visit (INDEPENDENT_AMBULATORY_CARE_PROVIDER_SITE_OTHER): Payer: Medicare Other | Admitting: Gastroenterology

## 2014-01-22 ENCOUNTER — Encounter: Payer: Self-pay | Admitting: Gastroenterology

## 2014-01-22 ENCOUNTER — Telehealth: Payer: Self-pay | Admitting: *Deleted

## 2014-01-22 VITALS — BP 112/60 | HR 64 | Ht 65.0 in | Wt 139.8 lb

## 2014-01-22 DIAGNOSIS — K625 Hemorrhage of anus and rectum: Secondary | ICD-10-CM

## 2014-01-22 DIAGNOSIS — K5900 Constipation, unspecified: Secondary | ICD-10-CM

## 2014-01-22 DIAGNOSIS — R194 Change in bowel habit: Secondary | ICD-10-CM

## 2014-01-22 MED ORDER — MOVIPREP 100 G PO SOLR: 1.0000 | Freq: Once | ORAL | Status: DC

## 2014-01-22 NOTE — Telephone Encounter (Signed)
01/22/2014   RE: SAMBA MADEIRA DOB: 09-26-1945 MRN: [id number]   Dear Dr. Claiborne Billings,    We have scheduled the above patient for an Colonoscopy. Our records show that he is on anticoagulation therapy.   Please advise as to how long the patient may come off his therapy of Plavix prior to the procedure, which is scheduled for 03-11-2014.  Please route the completed form to Evette Georges., CMA   Sincerely,    Hope Pigeon

## 2014-01-22 NOTE — Patient Instructions (Signed)
You have been scheduled for a colonoscopy. Please follow written instructions given to you at your visit today.  Please pick up your prep kit at the pharmacy within the next 1-3 days. If you use inhalers (even only as needed), please bring them with you on the day of your procedure. Your physician has requested that you go to www.startemmi.com and enter the access code given to you at your visit today.( SENT TO YOUR E-MAIL) This web site gives a general overview about your procedure. However, you should still follow specific instructions given to you by our office regarding your preparation for the procedure.  

## 2014-01-23 ENCOUNTER — Ambulatory Visit: Payer: Medicare Other | Admitting: Physician Assistant

## 2014-01-27 ENCOUNTER — Ambulatory Visit (HOSPITAL_COMMUNITY)
Admission: RE | Admit: 2014-01-27 | Discharge: 2014-01-27 | Disposition: A | Discharge status: Home / Self Care | Payer: Medicare Other | Source: Ambulatory Visit | Attending: Cardiology | Admitting: Cardiology

## 2014-01-27 DIAGNOSIS — I701 Atherosclerosis of renal artery: Secondary | ICD-10-CM | POA: Diagnosis present

## 2014-01-27 DIAGNOSIS — I159 Secondary hypertension, unspecified: Secondary | ICD-10-CM

## 2014-01-27 NOTE — Progress Notes (Signed)
Renal Duplex Completed. Gabriana Wilmott, BS, RDMS, RVT  

## 2014-02-03 ENCOUNTER — Encounter: Payer: Self-pay | Admitting: Gastroenterology

## 2014-02-03 DIAGNOSIS — K625 Hemorrhage of anus and rectum: Secondary | ICD-10-CM | POA: Insufficient documentation

## 2014-02-03 DIAGNOSIS — R194 Change in bowel habit: Secondary | ICD-10-CM | POA: Insufficient documentation

## 2014-02-03 DIAGNOSIS — K5900 Constipation, unspecified: Secondary | ICD-10-CM | POA: Insufficient documentation

## 2014-02-03 NOTE — Progress Notes (Signed)
     02/03/2014 [name] [id number] 30-Nov-1945   History of Present Illness:  This is a 68 year old male who is known to Dr. Henrene Pastor.  He is followed for GERD and history of esophageal stricture requiring dilation on several occasions.  He has history of multiple other medical problems including CAD s/p CABG x 4 on two occasions and carotid artery disease, which are all currently stable, but is maintained on Plavix.  He presents to our office today with complaints of change in bowel habits with constipation and rectal bleeding.  This is been a new issue with the past several weeks, but he is very concerned.  Says that's only had 4 bowel movements with the past 2 weeks and that was with the help of Miralax on two occasions and magnesium citrate once as well.  Has seen small amounts of bright red blood with BM's.  Prior to the onset of the constipation he had experienced an episode of diarrhea with mucus and some bright red blood (this only occurred once and then was followed by the onset of the constipation).  His last colonoscopy was performed by Dr. Henrene Pastor in 02/2010 at which time he had two polyps that were removed from the ascending colon and were tubular adenomas, therefore, it was recommended that he have a repeat colonoscopy in 5 years from that time.  Current Medications, Allergies, Past Medical History, Past Surgical History, Family History and Social History were reviewed in [employer] record.   Physical Exam: BP 112/60 mmHg  Pulse 64  Ht 5\' 5"  (1.651 m)  Wt 139 lb 12.8 oz (63.413 kg)  BMI 23.26 kg/m2 General: Well developed white male in no acute distress Head: Normocephalic and atraumatic Eyes:  Sclerae anicteric, conjunctiva pink  Ears: Normal auditory acuity Lungs: Clear throughout to auscultation Heart: Regular rate and rhythm Abdomen: Soft, non-distended.  Normal bowel sounds.  Non-tender. Musculoskeletal: Symmetrical with no gross deformities    Extremities: No edema  Neurological: Alert oriented x 4, grossly non-focal Psychological:  Alert and cooperative. Normal mood and affect  Assessment and Recommendations: -Change in bowel habits with constipation and rectal bleeding:  This is a recent change within the past several weeks.  Relieved with Miralax.  Bleeding was likely secondary to straining/outlet bleeding.  He will begin taking Miralax daily.  Patient is very concerned about this issue.  Will schedule for colonoscopy with Dr. Henrene Pastor as well.  The risks, benefits, and alternatives were discussed with the patient and he consents to proceed.  The risks benefits and alternatives to a temporary hold of anti-coagulants/anti-platelets for the procedure were discussed with the patient he consents to proceed. Obtain clearance from Dr. Claiborne Billings for ok to hold Plavix.

## 2014-02-03 NOTE — Progress Notes (Signed)
Agree with initial assessment and plans. Management of antiplatelet therapy per cardiology

## 2014-02-07 ENCOUNTER — Encounter: Payer: Self-pay | Admitting: Internal Medicine

## 2014-02-11 ENCOUNTER — Telehealth: Payer: Self-pay | Admitting: Cardiovascular Disease

## 2014-02-11 NOTE — Telephone Encounter (Signed)
New message     Pt needs to be cleared to have a colonoscopy.  He is on plavix.  Please respond to msg in epic

## 2014-02-11 NOTE — Telephone Encounter (Signed)
OK to hold plavix and ASA for 5 days. Resume when OK per GI.

## 2014-02-12 ENCOUNTER — Encounter (HOSPITAL_COMMUNITY): Payer: Self-pay | Admitting: Cardiovascular Disease

## 2014-02-12 NOTE — Telephone Encounter (Signed)
Patient notified to hold Plavix 5 days prior to procedure Patient verbalized understanding

## 2014-02-21 NOTE — Telephone Encounter (Signed)
Dr. Claiborne Billings completed this in the epic system.

## 2014-02-23 ENCOUNTER — Other Ambulatory Visit: Payer: Self-pay | Admitting: Cardiovascular Disease

## 2014-02-24 NOTE — Telephone Encounter (Signed)
Rx(s) sent to pharmacy electronically.  

## 2014-03-10 ENCOUNTER — Telehealth: Payer: Self-pay | Admitting: Internal Medicine

## 2014-03-10 NOTE — Telephone Encounter (Signed)
Pt states he has a sinus infection and was told by the doctor at urgent care that he should reschedule his colonoscopy due to possible respiratory problems. Pts colon rescheduled to 04/07/14@2 :30pm. Pt aware of appt.

## 2014-03-11 ENCOUNTER — Other Ambulatory Visit: Payer: Self-pay | Admitting: Cardiovascular Disease

## 2014-03-11 ENCOUNTER — Encounter: Payer: Medicare Other | Admitting: Internal Medicine

## 2014-03-12 ENCOUNTER — Other Ambulatory Visit: Payer: Self-pay | Admitting: Cardiovascular Disease

## 2014-03-12 NOTE — Telephone Encounter (Signed)
Rx(s) sent to pharmacy electronically.  

## 2014-03-24 ENCOUNTER — Telehealth: Payer: Self-pay | Admitting: Cardiovascular Disease

## 2014-03-24 NOTE — Telephone Encounter (Signed)
Received a call from [place].He stated he saw patient today.Stated EKG revealed atrial fib with rate 110 to 130 bpm.Stated he would like to [facility] to [facility].Advised Dr.Kelly out of office.Stated he needs to speak to a Dr in office.Advised DOD Dr.Crenshaw with a patient will have him return your call at # [phone].

## 2014-03-24 NOTE — Telephone Encounter (Signed)
DOD Dr.Crenshaw returned call to [place].Advised to increase beta blocker.Appointment scheduled with DOD Dr.Jordan tomorrow 03/25/14 at 11:45 am.

## 2014-03-24 NOTE — Telephone Encounter (Signed)
Dr.Amin would like to speak to a nurse about the pt because he is currently in his care and having a elevated heart rate. Please call  Thanks

## 2014-03-24 NOTE — Telephone Encounter (Signed)
Contacted by Dr. Reesa Chew; patient saw him today with palpitations. By his report electrocardiogram shows atrial fibrillation with heart rate between 110 and 130. Patient denies dyspnea or chest pain. I instructed them to increase atenolol to 50 mg by mouth twice a day for improved rate control. Discontinue Norvasc. I will arrange for him to be seen in the office tomorrow to make sure that his rate is controlled and he may also need anticoagulation. [name]

## 2014-03-25 ENCOUNTER — Encounter: Payer: Self-pay | Admitting: Cardiology

## 2014-03-25 ENCOUNTER — Ambulatory Visit (INDEPENDENT_AMBULATORY_CARE_PROVIDER_SITE_OTHER): Payer: Medicare Other | Admitting: Cardiology

## 2014-03-25 VITALS — BP 140/62 | HR 61 | Ht 65.0 in | Wt 143.9 lb

## 2014-03-25 DIAGNOSIS — I10 Essential (primary) hypertension: Secondary | ICD-10-CM

## 2014-03-25 DIAGNOSIS — I779 Disorder of arteries and arterioles, unspecified: Secondary | ICD-10-CM

## 2014-03-25 DIAGNOSIS — I739 Peripheral vascular disease, unspecified: Secondary | ICD-10-CM

## 2014-03-25 DIAGNOSIS — I25118 Atherosclerotic heart disease of native coronary artery with other forms of angina pectoris: Secondary | ICD-10-CM

## 2014-03-25 DIAGNOSIS — I480 Paroxysmal atrial fibrillation: Secondary | ICD-10-CM | POA: Insufficient documentation

## 2014-03-25 DIAGNOSIS — I452 Bifascicular block: Secondary | ICD-10-CM

## 2014-03-25 MED ORDER — ATENOLOL 50 MG PO TABS: 50.0000 mg | ORAL_TABLET | Freq: Two times a day (BID) | ORAL | Status: DC

## 2014-03-25 NOTE — Patient Instructions (Signed)
Continue your current therapy  We will schedule you for an echocardiogram  We will get a copy of your lab work and Ecg from Dr. Reesa Chew  Keep your appointment with Dr. Claiborne Billings

## 2014-03-25 NOTE — Progress Notes (Signed)
[name] Date of Birth: [birth year] Medical Record #[id number]  History of Present Illness: [name] is seen today as a work in for evaluation of atrial fibrillation. He is a 69 yo WM with a complex cardiac history. He has a history of initial CABG in 1984 and redo CABG in 1996. He is s/p stenting of the left main coronary and the SVG to the OM x 2. He is on chronic ASA and Plavix. He has severe PAD and HTN. Yesterday am he awoke at 2 am with a rapid heart beat. HR was about 130. He stayed in bed but later in the day went to Cardiac rehab. Rhythm strip there showed Afib/flutter. He was seen in Dr. Latina [name] office and Ecg showed atrial fibrillation with rate 120. His atenolol was increased to 50 mg bid. He subsequently converted after about 18 hours. His only complaint was of mild SOB. No chest pain. In the past he states he has had episodes of tachycardia lasting only 30 seconds but never this long. He feels fine today. No history of TIA/CVA or bleeding problems.     Medication List       This list is accurate as of: 03/25/14  1:06 PM.  Always use your most recent med list.               amLODipine 2.5 MG tablet  Commonly known as:  NORVASC  Take 1 tablet (2.5 mg total) by mouth 2 (two) times daily.     aspirin 81 MG tablet  Take 81 mg by mouth daily.     atenolol 50 MG tablet  Commonly known as:  TENORMIN  Take 1 tablet (50 mg total) by mouth 2 (two) times daily.     butalbital-aspirin-caffeine 50-325-40 MG per capsule  Commonly known as:  FIORINAL  Take 1 capsule by mouth every 4 (four) hours as needed. FOR HEADACHE.     clopidogrel 75 MG tablet  Commonly known as:  PLAVIX  Take 1 tablet (75 mg total) by mouth daily.     Co Q-10 100 MG Caps  Take by mouth.     dutasteride 0.5 MG capsule  Commonly known as:  AVODART  Take 0.5 mg by mouth daily.     esomeprazole 40 MG capsule  Commonly known as:  NEXIUM  TAKE 1 CAPSULE TWICE DAILY     loratadine 10 MG tablet    Commonly known as:  CLARITIN  Take 1 tablet by mouth daily as needed.     losartan 50 MG tablet  Commonly known as:  COZAAR  TAKE 1 TABLET (50 MG TOTAL) BY MOUTH DAILY.     lovastatin 40 MG tablet  Commonly known as:  MEVACOR  Take 1 tablet (40 mg total) by mouth at bedtime.     MOVIPREP 100 G Solr  Generic drug:  peg 3350 powder  Take 1 kit (200 g total) by mouth once.     multivitamins ther. w/minerals Tabs tablet  Take 1 tablet by mouth daily.     nitroGLYCERIN 0.4 MG/SPRAY spray  Commonly known as:  NITROLINGUAL  Place 1 spray under the tongue every 5 (five) minutes x 3 doses as needed for chest pain.     ranolazine 1000 MG SR tablet  Commonly known as:  RANEXA  Take 1 tablet (1,000 mg total) by mouth 2 (two) times daily.     torsemide 20 MG tablet  Commonly known as:  DEMADEX  Take 1 tablet (20  mg total) by mouth as needed.     vitamin E 200 UNIT capsule  Take 200 Units by mouth daily.         Allergies  Allergen Reactions  . Codeine Nausea And Vomiting  . Sulfonamide Derivatives     REACTION: rash    Past Medical History  Diagnosis Date  . Coronary artery disease 01/24/11    Successful PCI long segmental stensois  vein graft to the CX marginal vessel-graft previously stented prox. 3.0x41m TAXUS stent mid seg 3.0x241mTAXUS stent now tandem stens of 3.0x3873m 3.0x8mm2maced with the seg. 50,80 & 99% stenosis reduced to 0%  . Hyperlipidemia   . Angina   . Myocardial infarction 1991  . Myocardial infarction 2005    "had 3 heart attacks this year"  . Dysrhythmia     "PAC's and PVC's"  . Bronchitis   . Shortness of breath     "when I had the heart attacks"  . Hiatal hernia   . GERD (gastroesophageal reflux disease)   . Ulcer     "small; Dr. PerrHenrene Pastornd it 02/2010"  . Headache(784.0)   . Chronic back pain   . DDD (degenerative disc disease)   . Bulging discs     "8 of them; thoracic, lumbar, sacral area"  . Blood transfusion   . Anemia   .  Complication of anesthesia   . Esophageal dilatation     "have had it done 7 times; last time ~ 2001"  . Subclavian artery stenosis, left   . Colon polyps   . Esophageal stricture   . Pyloric stenosis   . S/P CABG (coronary artery bypass graft) 1984MarthaRenal artery stenosis   . RBBB   . Paroxysmal atrial fibrillation     Past Surgical History  Procedure Laterality Date  . Coronary angioplasty with stent placement  01/24/11    "today makes a total of 9 stents"  . Coronary artery bypass graft  1984    CABG X 4  . Coronary artery bypass graft  1996    CABG X 4  . Appendectomy  1953  . Tonsillectomy  1972  . Adenoidectomy      "when I was an infant"  . Retinal detachment surgery  04/2007    right  . Pylondial cyst removal    . Carotid endarterectomy Right 1996    CE  . Left heart catheterization with coronary/graft angiogram N/A 01/24/2011    Procedure: LEFT HEART CATHETERIZATION WITH COROBeatrix Fettersurgeon: [name];  [facility];  Service: Cardiovascular;  Laterality: N/A;    History   Social History  . Marital Status: Married    Spouse Name: N/A    Number of Children: N/A  . Years of Education: N/A   Social History Main Topics  . Smoking status: Former Smoker -- 1.00 packs/day for 38 years    Types: Cigarettes    Quit date: 02/09/2005  . Smokeless tobacco: Never Used     Comment: "quit smoking cigarrettes 2006"  . Alcohol Use: No  . Drug Use: No  . Sexual Activity: None   Other Topics Concern  . None   Social History Narrative    Family History  Problem Relation Age of Onset  . Colon cancer Neg Hx   . Breast cancer Paternal Aunt   . Diabetes Sister   . Ulcers Father   .  Heart disease Father   . Heart failure Father   . Heart attack Father   . AAA (abdominal aortic aneurysm) Father   . Stroke Mother   . Hypertension Mother     Review of Systems: As noted in HPI.  All other  systems were reviewed and are negative.  Physical Exam: BP 140/62 mmHg  Pulse 61  Ht 5' 5"  (1.651 m)  Wt 143 lb 14.4 oz (65.273 kg)  BMI 23.95 kg/m2 Filed Weights   03/25/14 1133  Weight: 143 lb 14.4 oz (65.273 kg)  GENERAL:  Well appearing WM in NAD HEENT:  PERRL, EOMI, sclera are clear. Oropharynx is clear. NECK:  No jugular venous distention, carotid upstroke brisk and symmetric, loud bilateral bruits, no thyromegaly or adenopathy LUNGS:  Clear to auscultation bilaterally CHEST:  Unremarkable HEART:  RRR,  PMI not displaced or sustained,S1 and S2 within normal limits, no S3, no S4: no clicks, no rubs. Gr 2/6 systolic murmur LSB ABD:  Soft, nontender. BS +, no masses or bruits. No hepatomegaly, no splenomegaly EXT:  2 + pulses throughout, no edema, no cyanosis no clubbing SKIN:  Warm and dry.  No rashes NEURO:  Alert and oriented x 3. Cranial nerves II through XII intact. PSYCH:  Cognitively intact    LABORATORY DATA: Ecg today shows NSR with a chronic RBBB. I have personally reviewed and interpreted this study.   Assessment / Plan: 1. Paroxysmal atrial fibrillation. Now converted to NSR. No known prior arrhythmia but history suggestive of brief episodes. Will continue higher atenolol dose. Will update Echo. I have requested lab work and Ecg from Dr. Reesa Chew. Patient's [place] vasc score is 3 with HASBLED score of 2. The question is whether we should anticoagulate him to reduce his risk of CVA. He is on long term DAPT for complex CAD with stents. He also takes Fiornal (with ASA) for chronic HAs. Options include stopping ASA products and adding anticoagulant to Plavix ?Coumadin vs NOAC. Clearly long term bleeding risk will be higher. Will defer to Dr. Claiborne Billings who knows him better. Patient has follow up with Dr. Claiborne Billings soon.   2. CAD s/p Redo CABG and multiple stent procedures.  3. PAD with carotid and renal artery disease. Chronic SFA occlusion.  4. HTN controlled.  5. RBBB chronic

## 2014-04-01 ENCOUNTER — Ambulatory Visit (HOSPITAL_COMMUNITY)
Admission: RE | Admit: 2014-04-01 | Discharge: 2014-04-01 | Disposition: A | Discharge status: Home / Self Care | Payer: Medicare Other | Source: Ambulatory Visit | Attending: Cardiology | Admitting: Cardiology

## 2014-04-01 DIAGNOSIS — I480 Paroxysmal atrial fibrillation: Secondary | ICD-10-CM | POA: Diagnosis present

## 2014-04-01 DIAGNOSIS — I779 Disorder of arteries and arterioles, unspecified: Secondary | ICD-10-CM | POA: Diagnosis not present

## 2014-04-01 DIAGNOSIS — I452 Bifascicular block: Secondary | ICD-10-CM

## 2014-04-01 DIAGNOSIS — I10 Essential (primary) hypertension: Secondary | ICD-10-CM | POA: Diagnosis not present

## 2014-04-01 DIAGNOSIS — I739 Peripheral vascular disease, unspecified: Secondary | ICD-10-CM

## 2014-04-01 DIAGNOSIS — I25118 Atherosclerotic heart disease of native coronary artery with other forms of angina pectoris: Secondary | ICD-10-CM | POA: Diagnosis not present

## 2014-04-01 DIAGNOSIS — I481 Persistent atrial fibrillation: Secondary | ICD-10-CM

## 2014-04-01 NOTE — Progress Notes (Signed)
2D Echo Performed 04/01/2014    Marygrace Drought, RCS

## 2014-04-02 ENCOUNTER — Other Ambulatory Visit: Payer: Self-pay | Admitting: Internal Medicine

## 2014-04-07 ENCOUNTER — Ambulatory Visit (AMBULATORY_SURGERY_CENTER): Payer: Medicare Other | Admitting: Internal Medicine

## 2014-04-07 ENCOUNTER — Encounter: Payer: Self-pay | Admitting: Internal Medicine

## 2014-04-07 VITALS — BP 125/77 | HR 61 | Temp 96.0°F | Resp 21 | Ht 65.0 in | Wt 139.0 lb

## 2014-04-07 DIAGNOSIS — K5521 Angiodysplasia of colon with hemorrhage: Secondary | ICD-10-CM | POA: Diagnosis not present

## 2014-04-07 DIAGNOSIS — R194 Change in bowel habit: Secondary | ICD-10-CM | POA: Diagnosis not present

## 2014-04-07 DIAGNOSIS — D122 Benign neoplasm of ascending colon: Secondary | ICD-10-CM

## 2014-04-07 DIAGNOSIS — K625 Hemorrhage of anus and rectum: Secondary | ICD-10-CM | POA: Diagnosis not present

## 2014-04-07 DIAGNOSIS — D125 Benign neoplasm of sigmoid colon: Secondary | ICD-10-CM | POA: Diagnosis not present

## 2014-04-07 DIAGNOSIS — D120 Benign neoplasm of cecum: Secondary | ICD-10-CM | POA: Diagnosis not present

## 2014-04-07 MED ORDER — SODIUM CHLORIDE 0.9 % IV SOLN: 500.0000 mL | INTRAVENOUS | Status: DC

## 2014-04-07 NOTE — Progress Notes (Signed)
Report to PACU, RN, vss, BBS= Clear.  

## 2014-04-07 NOTE — Op Note (Signed)
[place]  [address] [place], [address]   COLONOSCOPY PROCEDURE REPORT  PATIENT: [name], [name]  MR#: [id number] BIRTHDATE: [birth year] , 68  yrs. old GENDER: male ENDOSCOPIST: Eustace Quail, MD REFERRED BY:.  Self / Office PROCEDURE DATE:  04/07/2014 PROCEDURE:   Colonoscopy with snare polypectomy x 3 and Colonoscopy with control of bleeding (cecal AVM) First Screening Colonoscopy - Avg.  risk and is 50 yrs.  old or older - No.  Prior Negative Screening - Now for repeat screening. N/A  History of Adenoma - Now for follow-up colonoscopy & has been > or = to 3 yrs.  Yes hx of adenoma.  Has been 3 or more years since last colonoscopy.  Polyps Removed Today? Yes. ASA CLASS:   Class III INDICATIONS:rectal bleeding, change in bowel habits, and surveillance colonoscopy based on a history of adenomatous colonic polyp(s). Last colonoscopy December 2011 with small adenomas. MEDICATIONS: Monitored anesthesia care and Propofol 200 mg IV DESCRIPTION OF PROCEDURE:   After the risks benefits and alternatives of the procedure were thoroughly explained, informed consent was obtained.  The digital rectal exam revealed no abnormalities of the rectum.   The LB [id number]  endoscope was introduced through the anus and advanced to the cecum, which was identified by both the appendix and ileocecal valve. No adverse events experienced.   The quality of the prep was adequate, using MoviPrep  The instrument was then slowly withdrawn as the colon was fully examined.  COLON FINDINGS: Three polyps ranging between 3-30mm in size were found in the sigmoid , ascending, and  cecum.  A polypectomy was performed with a cold snare.  The resection was complete, the polyp tissue was completely retrieved and sent to histology.   A 1.5 cm angiodysplastic lesion was found at the cecum with oozing. Cautery the of the snare tip was used to ablate the lesion with hemostasis achieved   The  examination was otherwise normal.  Retroflexed views revealed no abnormalities. The time to cecum=5 minutes 36 seconds. Withdrawal time=19 minutes 21 seconds.  The scope was withdrawn and the procedure completed. COMPLICATIONS: There were no immediate complications.  ENDOSCOPIC IMPRESSION: 1.   Three polyps were found in the colon, and cecum; polypectomy was performed with a cold snare 2.   Angiodysplastic lesion at the cecum with oozing, status post cauterization 3.   The examination was otherwise normal  RECOMMENDATIONS: 1.  Resume Plavix tomorrow 2.  Recommend iron sulfate 325 mg once daily to reduce the risk of anemia 3.  Repeat Colonoscopy in 5 years.  eSigned:  Eustace Quail, MD 04/07/2014 2:46 PM   cc: The Patient    ; Zonia Kief

## 2014-04-07 NOTE — Patient Instructions (Addendum)
YOU HAD AN ENDOSCOPIC PROCEDURE TODAY AT THE [facility]: Refer to the procedure report that was given to you for any specific questions about what was found during the examination.  If the procedure report does not answer your questions, please call your gastroenterologist to clarify.  If you requested that your care partner not be given the details of your procedure findings, then the procedure report has been included in a sealed envelope for you to review at your convenience later.  YOU SHOULD EXPECT: Some feelings of bloating in the abdomen. Passage of more gas than usual.  Walking can help get rid of the air that was put into your GI tract during the procedure and reduce the bloating. If you had a lower endoscopy (such as a colonoscopy or flexible sigmoidoscopy) you may notice spotting of blood in your stool or on the toilet paper. If you underwent a bowel prep for your procedure, then you may not have a normal bowel movement for a few days.  DIET: Your first meal following the procedure should be a light meal and then it is ok to progress to your normal diet.  A half-sandwich or bowl of soup is an example of a good first meal.  Heavy or fried foods are harder to digest and may make you feel nauseous or bloated.  Likewise meals heavy in dairy and vegetables can cause extra gas to form and this can also increase the bloating.  Drink plenty of fluids but you should avoid alcoholic beverages for 24 hours.  ACTIVITY: Your care partner should take you home directly after the procedure.  You should plan to take it easy, moving slowly for the rest of the day.  You can resume normal activity the day after the procedure however you should NOT DRIVE or use heavy machinery for 24 hours (because of the sedation medicines used during the test).    SYMPTOMS TO REPORT IMMEDIATELY: A gastroenterologist can be reached at any hour.  During normal business hours, 8:30 AM to 5:00 PM Monday through Friday,  call [phone].  After hours and on weekends, please call the GI answering service at [phone] who will take a message and have the physician on call contact you.   Following lower endoscopy (colonoscopy or flexible sigmoidoscopy):  Excessive amounts of blood in the stool  Significant tenderness or worsening of abdominal pains  Swelling of the abdomen that is new, acute  Fever of 100F or higher  FOLLOW UP: If any biopsies were taken you will be contacted by phone or by letter within the next 1-3 weeks.  Call your gastroenterologist if you have not heard about the biopsies in 3 weeks.  Our staff will call the home number listed on your records the next business day following your procedure to check on you and address any questions or concerns that you may have at that time regarding the information given to you following your procedure. This is a courtesy call and so if there is no answer at the home number and we have not heard from you through the emergency physician on call, we will assume that you have returned to your regular daily activities without incident.  SIGNATURES/CONFIDENTIALITY: You and/or your care partner have signed paperwork which will be entered into your electronic medical record.  These signatures attest to the fact that that the information above on your After Visit Summary has been reviewed and is understood.  Full responsibility of the confidentiality of this   discharge information lies with you and/or your care-partner.  Recommendations Resume Plavix tomorrow. Await pathology results;next colonoscopy in 5 years. Discharge instructions given to patient and/or care partner. Polyp handout provided. Iron Sulfate 325 mg once daily.

## 2014-04-07 NOTE — Progress Notes (Addendum)
Called to room to assist during endoscopic procedure.  Patient ID and intended procedure confirmed with present staff. Received instructions for my participation in the procedure from the performing physician. Dr. Henrene Pastor coagulated an avm near the cecum.

## 2014-04-08 ENCOUNTER — Telehealth: Payer: Self-pay

## 2014-04-08 NOTE — Telephone Encounter (Signed)
  Follow up Call-  Call back number 04/07/2014  Post procedure Call Back phone  # [phone]  Permission to leave phone message Yes     Patient questions:  Do you have a fever, pain , or abdominal swelling? No. Pain Score  0 *  Have you tolerated food without any problems? Yes.    Have you been able to return to your normal activities? Yes.    Do you have any questions about your discharge instructions: Diet   No. Medications  No. Follow up visit  No.  Do you have questions or concerns about your Care? No.  Actions: * If pain score is 4 or above: No action needed, pain <4.

## 2014-04-10 ENCOUNTER — Telehealth: Payer: Self-pay | Admitting: Cardiovascular Disease

## 2014-04-10 NOTE — Telephone Encounter (Signed)
[name] patient. Hx: CAD, HTN, PAF  Pt had episode of elevated heart rates in PCP office on 1/18, this was brought to our attention.  Dr. Stanford Breed advised increase on pt's atenolol and to d/c Norvasc.  Pt was seen by Dr. [place] the following day.  Pt states since then he notes mild burning in his chest that is neither continual nor necessarily bothersome/painful.  He does, however, associate this w/ moderate activity.  I asked if he still had Nitro SL spray, he states does have this on hand but he doesn't think symptoms warrant it.  Pt wants to know if d/c of Norvasc may have something to do with the burning sensation.  Noted hx of GERD and PPI use.  Pt has f/u w/ Dr. Claiborne Billings, his primary cardiologist, on 2/24.  Will route to [facility] to advise.

## 2014-04-10 NOTE — Telephone Encounter (Signed)
Please call,question about his Norvasc.

## 2014-04-10 NOTE — Telephone Encounter (Signed)
Norvasc may have been helping him if he has some esophageal spasm. May need to go back on low dose, like 2.5 mg daily. Would defer to Dr. Claiborne Billings.  Dr. Lemmie Evens

## 2014-04-11 NOTE — Telephone Encounter (Signed)
Communicated [name] recommendation to pt. He will start back at low dose... 2.5 mg daily to see if helps his symptoms.

## 2014-04-11 NOTE — Telephone Encounter (Signed)
As long as BP is not low, can try to re-intitute norvasc at low dose

## 2014-04-15 ENCOUNTER — Encounter: Payer: Self-pay | Admitting: Internal Medicine

## 2014-04-23 ENCOUNTER — Ambulatory Visit: Payer: Medicare Other | Admitting: Cardiovascular Disease

## 2014-04-28 ENCOUNTER — Other Ambulatory Visit: Payer: Self-pay | Admitting: Cardiovascular Disease

## 2014-04-28 NOTE — Telephone Encounter (Signed)
Rx(s) sent to pharmacy electronically.  

## 2014-04-30 ENCOUNTER — Encounter: Payer: Self-pay | Admitting: Cardiovascular Disease

## 2014-04-30 ENCOUNTER — Ambulatory Visit (INDEPENDENT_AMBULATORY_CARE_PROVIDER_SITE_OTHER): Payer: Medicare Other | Admitting: Cardiovascular Disease

## 2014-04-30 VITALS — BP 120/66 | HR 63 | Ht 65.0 in | Wt 144.0 lb

## 2014-04-30 DIAGNOSIS — I452 Bifascicular block: Secondary | ICD-10-CM

## 2014-04-30 DIAGNOSIS — I739 Peripheral vascular disease, unspecified: Secondary | ICD-10-CM

## 2014-04-30 DIAGNOSIS — I480 Paroxysmal atrial fibrillation: Secondary | ICD-10-CM

## 2014-04-30 DIAGNOSIS — K625 Hemorrhage of anus and rectum: Secondary | ICD-10-CM

## 2014-04-30 DIAGNOSIS — I2510 Atherosclerotic heart disease of native coronary artery without angina pectoris: Secondary | ICD-10-CM

## 2014-04-30 DIAGNOSIS — I10 Essential (primary) hypertension: Secondary | ICD-10-CM

## 2014-04-30 DIAGNOSIS — E785 Hyperlipidemia, unspecified: Secondary | ICD-10-CM

## 2014-04-30 DIAGNOSIS — I779 Disorder of arteries and arterioles, unspecified: Secondary | ICD-10-CM

## 2014-04-30 MED ORDER — DILTIAZEM HCL ER COATED BEADS 120 MG PO CP24
120.0000 mg | ORAL_CAPSULE | Freq: Every day | ORAL | Status: DC
Start: 2014-04-30 — End: 2014-07-03

## 2014-04-30 NOTE — Patient Instructions (Signed)
Your physician has recommended you make the following change in your medication: STOP the amlodipine. Start new prescription for diltiazem. This has already been sent to your pharmacy.  Your physician recommends that you return for lab work and return office visit in 3 months. Lab slips will be mailed to you.

## 2014-04-30 NOTE — Progress Notes (Signed)
Patient ID: [name], male   DOB: Sep 25, 1945, 69 y.o.   MRN: [id number]     HPI: [name] is a 69 y.o. male who is a former patient of Dr. Rollene Fare. He presents now for a follow-up cardiology evaluation after experiencing a recent episode of paroxysmal atrial fibrillation.  [name] has an extensive cardiac and peripheral vascular history. He underwent initial CABG revascularization surgery in [place] in 1984. In 1996 he required re-do CABG revascularization surgery after all grafts were found to be occluded. At that time he also underwent right carotid endarterectomy. In 1998 he underwent stenting of his left main coronary artery and also had stenting done to the vein graft supplying the marginal vessel. He has history of right renal artery stenosis and underwent initial stenting with predilatation 2005. He has documented chronic right bundle branch block. He also significant peripheral vascular disease with bilateral SFA occlusion. He has known left subclavian artery occlusion with right subclavian stenosis. Has a history of hypertension, hyperlipidemia, and remotely smoked cigarettes. His last catheterization was done in November 2012 which showed significant native CAD with evidence for patent left main stent. He had an occluded circumflex marginal vessel from the AV groove circumflex. There was 80% ostial RCA stenosis with 90% stenosis in the mid right coronary artery proximal to the RV marginal branch and total occlusion of the mid RCA. An occluded vein graft supplying the right coronary artery but evidence for collateralization to the distal right artery the left coronary injection and injection of the grafts. The vein supplying the marginal vessel with the previously placed ostial proximal Taxus stent had in-stent narrowing of 50% followed by 95% stenosis in the body of the proximal third of the vessel graft prior to the previously placed mid grafts 3.0x20 mm Taxus stent. At that  time, he underwent difficult but successful intervention involving long segmental stenosis of the vein graft supplying the circumflex marginal vessel. Additional tandem 3.0x38 mm and 3.0x8 mm Taxus stents were inserted with excellent angiographic result. Since then, he has noticed significant improvement of his chest pain but he still notes occasional episodes of angina typically with the initiation of activity and oftentimes this improves after he "warms up." He is participating in cardiac rehabilitation at [facility].  He saw Dr. Rollene Fare in August 2014 per at that time due to concern for progressive carotid stenoses repeat Doppler studies were done and he also saw Dr. Sherren Mocha early for followup evaluation. Repeat imaging showed a patent right carotid endarterectomy site the Doppler velocity suggestive of 40  percent stenosis of the bilateral proximal internal carotid arteries. There was left subclavian steal noted with retrograde left vertebral artery flow, monophasic left subclavian artery flow and significant difference in the bilateral brachial pressures.  He established cardiology care with me in December 2014 December, he has continued to remain stable from a cardiac standpoint.  He is participating in cardiac rehabilitation at [facility] on Monday, Tuesday, and Wednesdays, and has been doing so for 10 years.  A nuclear perfusion study in May 2015 demonstrated mild inferobasal ischemia, most likely secondary to RCA distribution.  He admits to occasional episodes of palpitations particularly for his heart rate reaches 90 bpm.  He denies associated presyncope or syncope.  He denies any awareness of sustained runs of tachycardia.   I last saw him in November 2015.  On 03/25/2014.  He was seen by Dr. Martinique after he developed an episode of atrial fibrillation the day  before.  An ECG in Dr. Latina Craver  office showed atrial fibrillation with rate of 120.  His atenolol was increased to 5o mg twice a  day, and he converted to sinus rhythm after 18 hours.  When he saw Dr. Martinique the following day he was in sinus rhythm.  On the increased dose of beta blocker he denies recurrent palpitations.  He tells me he recently underwent a colonoscopy and had 3 polyps removed, but also required cauterization for another site that was oozing blood.  He presents for evaluation.  Past Medical History  Diagnosis Date  . Coronary artery disease 01/24/11    Successful PCI long segmental stensois  vein graft to the CX marginal vessel-graft previously stented prox. 3.0x20mm TAXUS stent mid seg 3.0x82mm TAXUS stent now tandem stens of 3.0x3mm & 3.0x92mm placed with the seg. 50,80 & 99% stenosis reduced to 0%  . Hyperlipidemia   . Angina   . Myocardial infarction 1991  . Myocardial infarction 2005    "had 3 heart attacks this year"  . Dysrhythmia     "PAC's and PVC's"  . Bronchitis   . Shortness of breath     "when I had the heart attacks"  . Hiatal hernia   . GERD (gastroesophageal reflux disease)   . Ulcer     "small; Dr. Henrene Pastor found it 02/2010"  . Headache(784.0)   . Chronic back pain   . DDD (degenerative disc disease)   . Bulging discs     "8 of them; thoracic, lumbar, sacral area"  . Blood transfusion   . Anemia   . Complication of anesthesia   . Esophageal dilatation     "have had it done 7 times; last time ~ 2001"  . Subclavian artery stenosis, left   . Colon polyps   . Esophageal stricture   . Pyloric stenosis   . S/P CABG (coronary artery bypass graft) [place]  . Renal artery stenosis   . RBBB   . Paroxysmal atrial fibrillation     Past Surgical History  Procedure Laterality Date  . Coronary angioplasty with stent placement  01/24/11    "today makes a total of 9 stents"  . Coronary artery bypass graft  1984    CABG X 4  . Coronary artery bypass graft  1996    CABG X 4  . Appendectomy  1953  . Tonsillectomy  1972  . Adenoidectomy       "when I was an infant"  . Retinal detachment surgery  04/2007    right  . Pylondial cyst removal    . Carotid endarterectomy Right 1996    CE  . Left heart catheterization with coronary/graft angiogram N/A 01/24/2011    Procedure: LEFT HEART CATHETERIZATION WITH Beatrix Fetters;  Surgeon: Troy Sine, MD;  Location: [facility] CATH LAB;  Service: Cardiovascular;  Laterality: N/A;    Allergies  Allergen Reactions  . Codeine Nausea And Vomiting  . Sulfonamide Derivatives     REACTION: rash    Current Outpatient Prescriptions  Medication Sig Dispense Refill  . aspirin 81 MG tablet Take 81 mg by mouth daily.    Marland Kitchen atenolol (TENORMIN) 50 MG tablet Take 1 tablet (50 mg total) by mouth 2 (two) times daily. 60 tablet 6  . butalbital-aspirin-caffeine (FIORINAL) 50-325-40 MG per capsule Take 1 capsule by mouth every 4 (four) hours as needed. FOR HEADACHE.     Marland Kitchen clopidogrel (PLAVIX) 75 MG  tablet Take 1 tablet (75 mg total) by mouth daily. 90 tablet 3  . Coenzyme Q10 (CO Q-10) 100 MG CAPS Take by mouth.    . dutasteride (AVODART) 0.5 MG capsule Take 0.5 mg by mouth daily.     Marland Kitchen esomeprazole (NEXIUM) 40 MG capsule TAKE 1 CAPSULE TWICE DAILY 180 capsule 0  . loratadine (CLARITIN) 10 MG tablet Take 1 tablet by mouth daily as needed.  3  . losartan (COZAAR) 50 MG tablet TAKE 1 TABLET (50 MG TOTAL) BY MOUTH DAILY. 90 tablet 3  . lovastatin (MEVACOR) 40 MG tablet Take 1 tablet (40 mg total) by mouth at bedtime. 90 tablet 3  . Multiple Vitamins-Minerals (MULTIVITAMINS THER. W/MINERALS) TABS Take 1 tablet by mouth daily.      . nitroGLYCERIN (NITROLINGUAL) 0.4 MG/SPRAY spray Place 1 spray under the tongue every 5 (five) minutes x 3 doses as needed for chest pain. 0.4 g 3  . ranolazine (RANEXA) 1000 MG SR tablet Take 1 tablet (1,000 mg total) by mouth 2 (two) times daily. 180 tablet 3  . torsemide (DEMADEX) 20 MG tablet Take 1 tablet (20 mg total) by mouth as needed. 90 tablet 1  . vitamin E 200 UNIT  capsule Take 200 Units by mouth daily.       No current facility-administered medications for this visit.    History   Social History  . Marital Status: Married    Spouse Name: N/A  . Number of Children: N/A  . Years of Education: N/A   Occupational History  . Not on file.   Social History Main Topics  . Smoking status: Former Smoker -- 1.00 packs/day for 38 years    Types: Cigarettes    Quit date: 02/09/2005  . Smokeless tobacco: Never Used     Comment: "quit smoking cigarrettes 2006"  . Alcohol Use: No  . Drug Use: No  . Sexual Activity: Not on file   Other Topics Concern  . Not on file   Social History Narrative   Socially he is married has 2 children 2 stepchildren. He quit tobacco in 2000.  Family History  Problem Relation Age of Onset  . Colon cancer Neg Hx   . Breast cancer Paternal Aunt   . Diabetes Sister   . Ulcers Father   . Heart disease Father   . Heart failure Father   . Heart attack Father   . AAA (abdominal aortic aneurysm) Father   . Stroke Mother   . Hypertension Mother    ROS General: Negative; No fevers, chills, or night sweats;  HEENT: History of a detached retina in his right eye; No changes in  hearing, sinus congestion, difficulty swallowing Pulmonary: Negative; No cough, wheezing, shortness of breath, hemoptysis Cardiovascular: see HPI;  GI: Negative; No nausea, vomiting, diarrhea, or abdominal pain GU: Negative; No dysuria, hematuria, or difficulty voiding Musculoskeletal: Negative; no myalgias, joint pain, or weakness Hematologic/Oncology: Negative; no easy bruising, bleeding Endocrine: Negative; no heat/cold intolerance; no diabetes Neuro: Negative; no changes in balance, headaches Skin: Negative; No rashes or skin lesions Psychiatric: Negative; No behavioral problems, depression Sleep: Negative; No snoring, daytime sleepiness, hypersomnolence, bruxism, restless legs, hypnogognic hallucinations, no cataplexy Other comprehensive  14 point system review is negative.   PE BP 120/66 mmHg  Pulse 63  Ht 5\' 5"  (1.651 m)  Wt 144 lb (65.318 kg)  BMI 23.96 kg/m2  General: Alert, oriented, no distress.  Skin: normal turgor, no rashes HEENT: Normocephalic, atraumatic. Pupils round and reactive;  sclera anicteric;no lid lag.  Nose without nasal septal hypertrophy Mouth/Parynx benign; Mallinpatti scale 3 Neck: No JVD, bilateral carotid bruits, right greater than left  Lungs: clear to ausculatation and percussion; no wheezing or rales Chest wall: no tenderness to palpitation Heart: RRR, s1 s2 normal with a 2/6 systolic murmur along the left sternal border. No S3 or S4 gallop. No diastolic murmurs.  No S3 or S4 gallop Abdomen: soft, nontender; no hepatosplenomehaly, BS+; abdominal aorta nontender and not dilated by palpation. Back: no CVA tenderness Pulses 2+ Extremities:  Trace ankle edema bilaterally;no clubbing cyanosis, Homan's sign negative  Neurologic: grossly nonfocal Psychologic: normal affect and mood.  ECG (independently read by me) normal sinus rhythm at 63 bpm.  Right bundle-branch block.  Probable left posterior fascicular block.  PR interval 170 ms.  QTC interval 480 ms.  November 2015 ECG (independently read by me): Normal sinus rhythm at 62 bpm.  Right bundle branch block.  Small nondiagnostic inferior Q waves.  Prior May 2015ECG : sinus rhythm at 58 beats per minute.  Right bundle branch block.  Prior December 2014 ECG: Sinus rhythm with sinus bradycardia 55 beats per minute. Right bundle branch block.  LABS:  BMET    Component Value Date/Time   NA 137 01/25/2011 2357   K 4.4 01/25/2011 2357   CL 105 01/25/2011 2357   CO2 26 01/25/2011 2357   GLUCOSE 97 01/25/2011 2357   BUN 16 01/25/2011 2357   CREATININE 1.25 01/25/2011 2357   CALCIUM 8.4 01/25/2011 2357   GFRNONAA 58* 01/25/2011 2357   GFRAA 68* 01/25/2011 2357     Hepatic Function Panel     Component Value Date/Time   PROT 6.7  08/06/2009 1601   ALBUMIN 4.1 08/06/2009 1601   AST 19 08/06/2009 1601   ALT 17 08/06/2009 1601   ALKPHOS 81 08/06/2009 1601   BILITOT 0.6 08/06/2009 1601     CBC    Component Value Date/Time   WBC 7.4 01/25/2011 2357   RBC 2.98* 01/25/2011 2357   HGB 9.8* 01/25/2011 2357   HCT 28.0* 01/25/2011 2357   PLT 120* 01/25/2011 2357   MCV 94.0 01/25/2011 2357   MCH 32.9 01/25/2011 2357   MCHC 35.0 01/25/2011 2357   RDW 12.5 01/25/2011 2357   LYMPHSABS 1.5 08/06/2009 1601   MONOABS 0.6 08/06/2009 1601   EOSABS 0.1 08/06/2009 1601   BASOSABS 0.0 08/06/2009 1601     BNP No results found for: PROBNP  Lipid Panel  No results found for: CHOL   RADIOLOGY: No results found.    ASSESSMENT AND PLAN: [name] is a 69 year old gentleman with extensive artery and peripheral vascular disease history, who is 32 years status post his initial CABG revascularization surgery done in [place] and 20 years status post his second CABG surgery. He's had multiple peripheral vascular procedures including carotid endarterectomy, renal stenting, and is documented SFA occlusion. His last catheterization intervention was done in 2012 which demonstrated diffuse disease in the vein graft supplying the marginal vessel and 2 additional Taxus stents inserted commencing ostially and extending to the proximal portion of the recently placed mid prior stent. He has a known occluded graft to the RCA with a mid RCA occlusion with left to right collaterals. He also has subclavian steal. He does have mild hypokinesis inferolaterally in the basal inferior wall with an EF of 45% at that catheterization. He does have chronic right bundle branch block. He has not been demonstrated to have an abdominal  aortic aneurysm. When I had seen him previously he had complained of mild chest anginal symptomatology typically when he starts exercise and particularly after eating. He does have potential areas of myocardium which may  be responsible for ischemia.  At that time, I added  Ranexa 500 mg twice a day to his medical regimen and subsequently this was further titrated to 1000 mg twice a day.  He recently was found to have an episode of paroxysmal atrial fibrillation which responded to increased beta blocker therapy and lasted for proximally 18 hours.  He is now on an increased beta blocker regimen consisting of atenolol 50 mg twice a day.  His chads 2 Vasc score is 3 and  is a candidate for long-term oral anticoagulation to reduce potential thromboembolic risk.  However, he recently  underwent colonoscopy and had 3 polyps removed, but also required cauterization at another site due to bleeding.  For this reason, I will not start him on oral anticoagulation presently.  He will continue to take aspirin and Plavix and increased beta blocker dose.  He will also continue with Nola seen 1000 g twice a day, which may be helpful to reduce recurrent AF.  His blood pressure today is controlled on losartan 50 mg in addition to his beta blocker therapy.  I have recommended he discontinue amlodipine which he currently is taking a 2.5 mg in attempt to reduce potential further episodes of AF.  I will start him on Cardizem CD 120 mg.  I will see him in 3 months for follow up evaluation or sooner if recurrent problems arise.   Time spent: 25 minutes   Troy Sine, MD, [facility]  04/30/2014 7:26 PM

## 2014-07-01 ENCOUNTER — Other Ambulatory Visit: Payer: Self-pay | Admitting: Cardiovascular Disease

## 2014-07-01 ENCOUNTER — Telehealth: Payer: Self-pay | Admitting: Cardiovascular Disease

## 2014-07-01 NOTE — Telephone Encounter (Signed)
Re: Cardizem SE's  Spoke to patient. He notes switched from Norvasc to cardizem at last OV (04/30/14)  No problems w/ Norvasc, he notes was switched to cardizem to better manage AF  Symptoms began shortly after starting new med. Notes some dizziness, nausea, HA, slight dyspnea, stomach burning, slight swelling in ankles & feet.  Pt notes no syncopal events. He has been compliant w/ physical activity including cardiac rehab.  Reports BPs typically 0000000 systolic, [id number] HR.  Today's VS - 132/62, HR 62  Advised pt that since this problem has been ongoing for a while, thought it best to send to Dr. Claiborne Billings to address decision on medication management/other advice.  Pt would prefer to return to Norvasc rather than starting a new med - will defer to [name] guidance on this.

## 2014-07-01 NOTE — Telephone Encounter (Signed)
It seems that HR and BP are stable. Not certain if all symptoms are related to cardizem. Can try it longer if able to tolerate; otherwise switch back to norvasc

## 2014-07-01 NOTE — Telephone Encounter (Signed)
Please call,thinks he is having some effects from Cardizem.

## 2014-07-02 ENCOUNTER — Other Ambulatory Visit: Payer: Self-pay | Admitting: Internal Medicine

## 2014-07-03 NOTE — Telephone Encounter (Signed)
Spoke with patient and communicated advise per Dr. Claiborne Billings - patient would like to change to amlodipine and d/c diltiazem.  Rx(s) sent to pharmacy electronically.

## 2014-07-03 NOTE — Telephone Encounter (Signed)
Rx(s) sent to pharmacy electronically. Per triage call, Dr. Claiborne Billings said OK to change from diltiazem back to amlodipine r/t SE

## 2014-08-01 ENCOUNTER — Ambulatory Visit (INDEPENDENT_AMBULATORY_CARE_PROVIDER_SITE_OTHER): Payer: Medicare Other | Admitting: Cardiovascular Disease

## 2014-08-01 ENCOUNTER — Encounter: Payer: Self-pay | Admitting: Cardiovascular Disease

## 2014-08-01 VITALS — BP 114/60 | HR 59 | Ht 65.0 in | Wt 139.0 lb

## 2014-08-01 DIAGNOSIS — I10 Essential (primary) hypertension: Secondary | ICD-10-CM | POA: Diagnosis not present

## 2014-08-01 DIAGNOSIS — I480 Paroxysmal atrial fibrillation: Secondary | ICD-10-CM

## 2014-08-01 DIAGNOSIS — I452 Bifascicular block: Secondary | ICD-10-CM

## 2014-08-01 DIAGNOSIS — D649 Anemia, unspecified: Secondary | ICD-10-CM

## 2014-08-01 DIAGNOSIS — I2510 Atherosclerotic heart disease of native coronary artery without angina pectoris: Secondary | ICD-10-CM

## 2014-08-01 DIAGNOSIS — R5381 Other malaise: Secondary | ICD-10-CM

## 2014-08-01 DIAGNOSIS — R5383 Other fatigue: Secondary | ICD-10-CM

## 2014-08-01 DIAGNOSIS — K625 Hemorrhage of anus and rectum: Secondary | ICD-10-CM

## 2014-08-01 DIAGNOSIS — E785 Hyperlipidemia, unspecified: Secondary | ICD-10-CM | POA: Diagnosis not present

## 2014-08-01 LAB — COMPREHENSIVE METABOLIC PANEL
ALT: 12 U/L (ref 0–53)
AST: 16 U/L (ref 0–37)
Albumin: 3.9 g/dL (ref 3.5–5.2)
Alkaline Phosphatase: 69 U/L (ref 39–117)
BILIRUBIN TOTAL: 0.4 mg/dL (ref 0.2–1.2)
BUN: 26 mg/dL — AB (ref 6–23)
CALCIUM: 9.2 mg/dL (ref 8.4–10.5)
CO2: 25 meq/L (ref 19–32)
CREATININE: 1.21 mg/dL (ref 0.50–1.35)
Chloride: 108 mEq/L (ref 96–112)
Glucose, Bld: 96 mg/dL (ref 70–99)
POTASSIUM: 4.6 meq/L (ref 3.5–5.3)
Sodium: 140 mEq/L (ref 135–145)
TOTAL PROTEIN: 6.2 g/dL (ref 6.0–8.3)

## 2014-08-01 LAB — FOLATE: Folate: >20 ng/mL

## 2014-08-01 LAB — TSH: TSH: 0.636 u[IU]/mL (ref 0.350–4.500)

## 2014-08-01 LAB — IRON AND TIBC
%SAT: 33 % (ref 20–55)
IRON: 86 ug/dL (ref 42–165)
TIBC: 264 ug/dL (ref 215–435)
UIBC: 178 ug/dL (ref 125–400)

## 2014-08-01 LAB — LIPID PANEL
CHOL/HDL RATIO: 2.6 ratio
Cholesterol: 172 mg/dL (ref 0–200)
HDL: 66 mg/dL (ref >=40)
LDL Cholesterol: 88 mg/dL (ref 0–99)
Triglycerides: 90 mg/dL (ref <150)
VLDL: 18 mg/dL (ref 0–40)

## 2014-08-01 LAB — FERRITIN: FERRITIN: 61 ng/mL (ref 22–322)

## 2014-08-01 LAB — VITAMIN B12: Vitamin B-12: 683 pg/mL (ref 211–911)

## 2014-08-01 LAB — MAGNESIUM: Magnesium: 2 mg/dL (ref 1.5–2.5)

## 2014-08-01 NOTE — Progress Notes (Signed)
Patient ID: [name], male   DOB: [birth year], 69 y.o.   MRN: [id number]     HPI: [name] is a 69 y.o. male who is a former patient of Dr. Rollene Fare.  I last saw him in February 2016 after he had experienced an episode of paroxysmal atrial fibrillation.  He presents for follow up evaluation.  illation.  [name] has an extensive cardiac and peripheral vascular history. He underwent initial CABG revascularization surgery in [place] in 1984. In 1996 he required re-do CABG revascularization surgery after all grafts were found to be occluded. At that time he also underwent right carotid endarterectomy. In 1998 he underwent stenting of his left main coronary artery and also had stenting done to the vein graft supplying the marginal vessel. He has history of right renal artery stenosis and underwent initial stenting with predilatation 2005. He has documented chronic right bundle branch block. He also significant peripheral vascular disease with bilateral SFA occlusion. He has known left subclavian artery occlusion with right subclavian stenosis. Has a history of hypertension, hyperlipidemia, and remotely smoked cigarettes. His last catheterization was done in November 2012 which showed significant native CAD with evidence for patent left main stent. He had an occluded circumflex marginal vessel from the AV groove circumflex. There was 80% ostial RCA stenosis with 90% stenosis in the mid right coronary artery proximal to the RV marginal branch and total occlusion of the mid RCA. An occluded vein graft supplying the right coronary artery but evidence for collateralization to the distal right artery the left coronary injection and injection of the grafts. The vein supplying the marginal vessel with the previously placed ostial proximal Taxus stent had in-stent narrowing of 50% followed by 95% stenosis in the body of the proximal third of the vessel graft prior to the previously placed mid grafts 3.0x20  mm Taxus stent. At that time, he underwent difficult but successful intervention involving long segmental stenosis of the vein graft supplying the circumflex marginal vessel. Additional tandem 3.0x38 mm and 3.0x8 mm Taxus stents were inserted with excellent angiographic result. Since then, he has noticed significant improvement of his chest pain but he still notes occasional episodes of angina typically with the initiation of activity and oftentimes this improves after he "warms up." He is participating in cardiac rehabilitation at [facility].  He saw Dr. Rollene Fare in August 2014 per at that time due to concern for progressive carotid stenoses repeat Doppler studies were done and he also saw Dr. Sherren Mocha early for followup evaluation. Repeat imaging showed a patent right carotid endarterectomy site the Doppler velocity suggestive of 40  percent stenosis of the bilateral proximal internal carotid arteries. There was left subclavian steal noted with retrograde left vertebral artery flow, monophasic left subclavian artery flow and significant difference in the bilateral brachial pressures.  He established cardiology care with me in December 2014 December, he has continued to remain stable from a cardiac standpoint.  He is participating in cardiac rehabilitation at [facility] on Monday, Tuesday, and Wednesdays, and has been doing so for 10 years.  A nuclear perfusion study in May 2015 demonstrated mild inferobasal ischemia, most likely secondary to RCA distribution.    On 03/25/2014 he was seen by Dr. Martinique after he developed an episode of atrial fibrillation the day before.  An ECG in Dr. Latina Craver  office showed atrial fibrillation with rate of 120.  His atenolol was increased to 50 mg twice a day, and he converted to  sinus rhythm after 18 hours.  When he saw Dr. Martinique the following day he was in sinus rhythm.  On the increased dose of beta blocker he denies recurrent palpitations.  He tells me he  recently underwent a colonoscopy and had 3 polyps removed, but also required cauterization for another site that was oozing blood.  He presents for evaluation.  When I last saw him, he was continuing to do well and maintaining sinus rhythm.  We discussed potential anti-coagulation therapy particularly with his chads 2 vas score of at least 3 but due to his recent GI bleed issues and polypectomy.  This was not started at that time and he was told to continue take aspirin and Plavix.  Over the past several months, he has felt well.  He is unaware of any recurrent episodes of breakthrough atrial fibrillation but at times he has noted some premature beats with heart rates in the 80s.  He still notes occasional blood in his stool.  He was told by Dr. Henrene Pastor to empirically start iron.  He denies chest pain, PND orthopnea.  He denies orthostatic symptoms.  He presents for reevaluation.  Past Medical History  Diagnosis Date  . Coronary artery disease 01/24/11    Successful PCI long segmental stensois  vein graft to the CX marginal vessel-graft previously stented prox. 3.0x71m TAXUS stent mid seg 3.0x241mTAXUS stent now tandem stens of 3.0x3864m 3.0x8mm61maced with the seg. 50,80 & 99% stenosis reduced to 0%  . Hyperlipidemia   . Angina   . Myocardial infarction 1991  . Myocardial infarction 2005    "had 3 heart attacks this year"  . Dysrhythmia     "PAC's and PVC's"  . Bronchitis   . Shortness of breath     "when I had the heart attacks"  . Hiatal hernia   . GERD (gastroesophageal reflux disease)   . Ulcer     "small; Dr. PerrHenrene Pastornd it 02/2010"  . Headache(784.0)   . Chronic back pain   . DDD (degenerative disc disease)   . Bulging discs     "8 of them; thoracic, lumbar, sacral area"  . Blood transfusion   . Anemia   . Complication of anesthesia   . Esophageal dilatation     "have had it done 7 times; last time ~ 2001"  . Subclavian artery stenosis, left   . Colon polyps   . Esophageal  stricture   . Pyloric stenosis   . S/P CABG (coronary artery bypass graft) 1984[place] artery stenosis   . RBBB   . Paroxysmal atrial fibrillation     Past Surgical History  Procedure Laterality Date  . Coronary angioplasty with stent placement  01/24/11    "today makes a total of 9 stents"  . Coronary artery bypass graft  1984    CABG X 4  . Coronary artery bypass graft  1996    CABG X 4  . Appendectomy  1953  . Tonsillectomy  1972  . Adenoidectomy      "when I was an infant"  . Retinal detachment surgery  04/2007    right  . Pylondial cyst removal    . Carotid endarterectomy Right 1996    CE  . Left heart catheterization with coronary/graft angiogram N/A 01/24/2011    Procedure: LEFT HEART CATHETERIZATION WITH COROBeatrix Fettersurgeon: ThomTroy Sine;  [facility];  Service:  Cardiovascular;  Laterality: N/A;    Allergies  Allergen Reactions  . Codeine Nausea And Vomiting  . Sulfonamide Derivatives     REACTION: rash    Current Outpatient Prescriptions  Medication Sig Dispense Refill  . amLODipine (NORVASC) 2.5 MG tablet Take 1 tablet (2.5 mg total) by mouth daily. 90 tablet 0  . aspirin 81 MG tablet Take 81 mg by mouth daily.    Marland Kitchen atenolol (TENORMIN) 50 MG tablet Take 1 tablet (50 mg total) by mouth 2 (two) times daily. 60 tablet 6  . butalbital-aspirin-caffeine (FIORINAL) 50-325-40 MG per capsule Take 1 capsule by mouth every 4 (four) hours as needed. FOR HEADACHE.     Marland Kitchen clopidogrel (PLAVIX) 75 MG tablet Take 1 tablet (75 mg total) by mouth daily. 90 tablet 3  . Coenzyme Q10 (CO Q-10) 100 MG CAPS Take by mouth.    . dutasteride (AVODART) 0.5 MG capsule Take 0.5 mg by mouth daily.     Marland Kitchen esomeprazole (NEXIUM) 40 MG capsule TAKE 1 CAPSULE TWICE DAILY 180 capsule 1  . loratadine (CLARITIN) 10 MG tablet Take 1 tablet by mouth daily as needed.  3  . losartan (COZAAR) 50 MG tablet TAKE 1 TABLET (50 MG  TOTAL) BY MOUTH DAILY. 90 tablet 3  . lovastatin (MEVACOR) 40 MG tablet Take 1 tablet (40 mg total) by mouth at bedtime. 90 tablet 3  . Multiple Vitamins-Minerals (MULTIVITAMINS THER. W/MINERALS) TABS Take 1 tablet by mouth daily.      . nitroGLYCERIN (NITROLINGUAL) 0.4 MG/SPRAY spray Place 1 spray under the tongue every 5 (five) minutes x 3 doses as needed for chest pain. 0.4 g 3  . ranolazine (RANEXA) 1000 MG SR tablet Take 1 tablet (1,000 mg total) by mouth 2 (two) times daily. 180 tablet 3  . torsemide (DEMADEX) 20 MG tablet Take 1 tablet (20 mg total) by mouth as needed. 90 tablet 1  . vitamin E 200 UNIT capsule Take 200 Units by mouth daily.       No current facility-administered medications for this visit.    History   Social History  . Marital Status: Married    Spouse Name: N/A  . Number of Children: N/A  . Years of Education: N/A   Occupational History  . Not on file.   Social History Main Topics  . Smoking status: Former Smoker -- 1.00 packs/day for 38 years    Types: Cigarettes    Quit date: 02/09/2005  . Smokeless tobacco: Never Used     Comment: "quit smoking cigarrettes 2006"  . Alcohol Use: No  . Drug Use: No  . Sexual Activity: Not on file   Other Topics Concern  . Not on file   Social History Narrative   Socially he is married has 2 children 2 stepchildren. He quit tobacco in 2000.  Family History  Problem Relation Age of Onset  . Colon cancer Neg Hx   . Breast cancer Paternal Aunt   . Diabetes Sister   . Ulcers Father   . Heart disease Father   . Heart failure Father   . Heart attack Father   . AAA (abdominal aortic aneurysm) Father   . Stroke Mother   . Hypertension Mother    ROS General: Negative; No fevers, chills, or night sweats;  HEENT: History of a detached retina in his right eye; No changes in  hearing, sinus congestion, difficulty swallowing Pulmonary: Negative; No cough, wheezing, shortness of breath, hemoptysis Cardiovascular: see  HPI;  GI:  Negative; No nausea, vomiting, diarrhea, or abdominal pain GU: Negative; No dysuria, hematuria, or difficulty voiding Musculoskeletal: Negative; no myalgias, joint pain, or weakness Hematologic/Oncology: Negative; no easy bruising, bleeding Endocrine: Negative; no heat/cold intolerance; no diabetes Neuro: Negative; no changes in balance, headaches Skin: Negative; No rashes or skin lesions Psychiatric: Negative; No behavioral problems, depression Sleep: Negative; No snoring, daytime sleepiness, hypersomnolence, bruxism, restless legs, hypnogognic hallucinations, no cataplexy Other comprehensive 14 point system review is negative.   PE BP 114/60 mmHg  Pulse 59  Ht 5' 5"  (1.651 m)  Wt 139 lb (63.05 kg)  BMI 23.13 kg/m2  Wt Readings from Last 3 Encounters:  08/01/14 139 lb (63.05 kg)  04/30/14 144 lb (65.318 kg)  04/07/14 139 lb (63.05 kg)   General: Alert, oriented, no distress.  Skin: normal turgor, no rashes HEENT: Normocephalic, atraumatic. Pupils round and reactive; sclera anicteric;no lid lag.  Nose without nasal septal hypertrophy Mouth/Parynx benign; Mallinpatti scale 3 Neck: No JVD, bilateral carotid bruits, right greater than left  Lungs: clear to ausculatation and percussion; no wheezing or rales Chest wall: no tenderness to palpitation Heart: RRR, s1 s2 normal with a 2/6 systolic murmur along the left sternal border. No S3 or S4 gallop. No diastolic murmurs.  No S3 or S4 gallop Abdomen: soft, nontender; no hepatosplenomehaly, BS+; abdominal aorta nontender and not dilated by palpation. Back: no CVA tenderness Pulses 2+ Extremities:  Trace ankle edema bilaterally;no clubbing cyanosis, Homan's sign negative  Neurologic: grossly nonfocal Psychologic: normal affect and mood.  ECG (independently read by me): Sinus bradycardia 59 bpm with right bundle branch block.I ST-T changes.  QTc interval 475 ms.  PR interval 182 ms.  No ectopy.  February 2016 ECG  (independently read by me) normal sinus rhythm at 63 bpm.  Right bundle-branch block.  Probable left posterior fascicular block.  PR interval 170 ms.  QTC interval 480 ms.  November 2015 ECG (independently read by me): Normal sinus rhythm at 62 bpm.  Right bundle branch block.  Small nondiagnostic inferior Q waves.  Prior May 2015ECG : sinus rhythm at 58 beats per minute.  Right bundle branch block.  Prior December 2014 ECG: Sinus rhythm with sinus bradycardia 55 beats per minute. Right bundle branch block.  LABS: BMP Latest Ref Rng 01/25/2011 01/25/2011 08/06/2009  Glucose 70 - 99 mg/dL 97 129(H) 99  BUN 6 - 23 mg/dL 16 14 20   Creatinine 0.50 - 1.35 mg/dL 1.25 0.97 1.2  Sodium 135 - 145 mEq/L 137 135 145  Potassium 3.5 - 5.1 mEq/L 4.4 3.7 4.7  Chloride 96 - 112 mEq/L 105 106 107  CO2 19 - 32 mEq/L 26 23 31   Calcium 8.4 - 10.5 mg/dL 8.4 8.0(L) 9.2   Hepatic Function Latest Ref Rng 08/06/2009  Total Protein 6.0-8.3 g/dL 6.7  Albumin 3.5-5.2 g/dL 4.1  AST 0-37 units/L 19  ALT 0-53 units/L 17  Alk Phosphatase 39-117 units/L 81  Total Bilirubin 0.3-1.2 mg/dL 0.6   CBC Latest Ref Rng 01/25/2011 01/25/2011 01/25/2011  WBC 4.0 - 10.5 K/uL 7.4 - 6.8  Hemoglobin 13.0 - 17.0 g/dL 9.8(L) 10.1(L) 9.1(L)  Hematocrit 39.0 - 52.0 % 28.0(L) 28.5(L) 26.0(L)  Platelets 150 - 400 K/uL 120(L) - 127(L)   Lab Results  Component Value Date   MCV 94.0 01/25/2011   MCV 93.2 01/25/2011   MCV 98.1 08/06/2009   No results found for: TSH  Lipid Panel  No results found for: CHOL, TRIG, HDL, CHOLHDL, VLDL, LDLCALC, LDLDIRECT   RADIOLOGY:  No results found.    ASSESSMENT AND PLAN: [name] is a 69 year old gentleman with extensive artery and peripheral vascular disease history, who is 32 years status post his initial CABG revascularization surgery done in [place] and 20 years status post his second CABG surgery. He's had multiple peripheral vascular procedures including carotid endarterectomy,  renal stenting, and is documented SFA occlusion. His last catheterization intervention was done in 2012 which demonstrated diffuse disease in the vein graft supplying the marginal vessel and 2 additional Taxus stents inserted commencing ostially and extending to the proximal portion of the recently placed mid prior stent. He has a known occluded graft to the RCA with a mid RCA occlusion with left to right collaterals. He also has subclavian steal. He does have mild hypokinesis inferolaterally in the basal inferior wall with an EF of 45% at that catheterization. He does have chronic right bundle branch block. He has not been demonstrated to have an abdominal aortic aneurysm. When I had seen him previously he had complained of mild chest anginal symptomatology typically when he starts exercise and particularly after eating. He does have potential areas of myocardium which may be responsible for ischemia.  At that time, I added  Ranexa 500 mg twice a day to his medical regimen and subsequently this was further titrated to 1000 mg twice a day.  In January 2016.  He was found to have an episode of paroxysmal atrial fibrillation which responded to increased beta blocker therapy and lasted for ~18 hours.  He is now on an increased beta blocker regimen consisting of atenolol 50 mg twice a day.  His chads 2 Vasc score is 3 and  is a candidate for long-term oral anticoagulation to reduce potential thromboembolic risk.  He has been on aspirin and Plavix.  When I last saw him, he had previously completed cauterization of polyps.  He continues to still notes some occasional bright red blood per rectum and is being followed by Dr. Henrene Pastor.  Again discussed anticoagulation versus his current dual platelet therapy.  He is unaware of any breakthrough arrhythmia.  Presently.  However, with his recent episode of some mild increased palpitations.  I am further titrating his atenolol to 75 mg in the morning and 50 mg in the evening.   Complete set of blood work will be obtained in the fasting state consisting of a CBC, chemistry profile, iron studies, lipid studies, TSH, and magnesium level.  As long as he remains stable I will see him in 4-6 months for reevaluation.  Time spent: 25 minutes   Troy Sine, MD, [facility]  08/01/2014 11:56 AM

## 2014-08-01 NOTE — Patient Instructions (Signed)
Your physician recommends that you return for lab work fasting.  Your physician recommends that you schedule a follow-up appointment in: 4-6 months with Dr. Claiborne Billings.

## 2014-09-01 ENCOUNTER — Other Ambulatory Visit: Payer: Self-pay

## 2014-09-24 ENCOUNTER — Other Ambulatory Visit: Payer: Self-pay | Admitting: Cardiovascular Disease

## 2014-09-24 NOTE — Telephone Encounter (Signed)
REFILL 

## 2014-10-15 ENCOUNTER — Telehealth: Payer: Self-pay | Admitting: Cardiovascular Disease

## 2014-10-15 MED ORDER — ATENOLOL 50 MG PO TABS: 50.0000 mg | ORAL_TABLET | Freq: Two times a day (BID) | ORAL | Status: DC

## 2014-10-15 NOTE — Telephone Encounter (Signed)
Refill submitted to patient's preferred pharmacy. Informed patient via VM.

## 2014-10-15 NOTE — Telephone Encounter (Signed)
°  1. Which medications need to be refilled? Atenolol( 2 1/2 tabs po a day)   2. Which pharmacy is medication to be sent to? [facility] on [address] in [place]  3. Do they need a 30 day or 90 day supply? 90  4. Would they like a call back once the medication has been sent to the pharmacy? Yes

## 2014-10-23 ENCOUNTER — Telehealth: Payer: Self-pay | Admitting: Cardiovascular Disease

## 2014-10-23 NOTE — Telephone Encounter (Signed)
Returned call to patient.He stated when he finished cardiac rehab yesterday his heart went out of rhythm.Rate 91 Stated heart goes in and out of AFib,at present pulse 69 B/P 95/55.Stated he takes atenolol 50 mg 1&1/2 tablets in am and 1 tablet in pm.Stated he wanted to ask Dr.Kelly if he needs to increase atenolol.I will speak to [facility] this afternoon and call you back.

## 2014-10-23 NOTE — Telephone Encounter (Signed)
[name] is calling because at rest his heart rate is in the 90"s and irregular heart beat . Please call    Thanks

## 2014-10-23 NOTE — Telephone Encounter (Signed)
Returned call to patient Dr.Kelly advised since B/P low and pulse 69 stay on current medications.Advised to monitor and call back if heart rate fast.Appointment scheduled with Dr.Kelly 11/25/14 at 9:00 am.

## 2014-11-25 ENCOUNTER — Ambulatory Visit (INDEPENDENT_AMBULATORY_CARE_PROVIDER_SITE_OTHER): Payer: Medicare Other | Admitting: Cardiovascular Disease

## 2014-11-25 VITALS — BP 110/52 | HR 58 | Ht 65.0 in | Wt 142.8 lb

## 2014-11-25 DIAGNOSIS — R8299 Other abnormal findings in urine: Secondary | ICD-10-CM | POA: Diagnosis not present

## 2014-11-25 DIAGNOSIS — I480 Paroxysmal atrial fibrillation: Secondary | ICD-10-CM | POA: Diagnosis not present

## 2014-11-25 DIAGNOSIS — E785 Hyperlipidemia, unspecified: Secondary | ICD-10-CM

## 2014-11-25 DIAGNOSIS — I779 Disorder of arteries and arterioles, unspecified: Secondary | ICD-10-CM

## 2014-11-25 DIAGNOSIS — I739 Peripheral vascular disease, unspecified: Secondary | ICD-10-CM

## 2014-11-25 DIAGNOSIS — R82998 Other abnormal findings in urine: Secondary | ICD-10-CM

## 2014-11-25 DIAGNOSIS — I452 Bifascicular block: Secondary | ICD-10-CM

## 2014-11-25 DIAGNOSIS — Z95828 Presence of other vascular implants and grafts: Secondary | ICD-10-CM

## 2014-11-25 LAB — COMPREHENSIVE METABOLIC PANEL
ALT: 10 U/L (ref 9–46)
AST: 15 U/L (ref 10–35)
Albumin: 3.9 g/dL (ref 3.6–5.1)
Alkaline Phosphatase: 75 U/L (ref 40–115)
BUN: 26 mg/dL — ABNORMAL HIGH (ref 7–25)
CO2: 26 mmol/L (ref 20–31)
CREATININE: 1.06 mg/dL (ref 0.70–1.18)
Calcium: 8.7 mg/dL (ref 8.6–10.3)
Chloride: 106 mmol/L (ref 98–110)
Glucose, Bld: 89 mg/dL (ref 65–99)
Potassium: 4.8 mmol/L (ref 3.5–5.3)
Sodium: 138 mmol/L (ref 135–146)
TOTAL PROTEIN: 6 g/dL — AB (ref 6.1–8.1)
Total Bilirubin: 0.3 mg/dL (ref 0.2–1.2)

## 2014-11-25 LAB — CBC
HCT: 38.3 % — ABNORMAL LOW (ref 39.0–52.0)
Hemoglobin: 13.1 g/dL (ref 13.0–17.0)
MCH: 33.2 pg (ref 26.0–34.0)
MCHC: 34.2 g/dL (ref 30.0–36.0)
MCV: 97 fL (ref 78.0–100.0)
MPV: 10.3 fL (ref 8.6–12.4)
Platelets: 188 10*3/uL (ref 150–400)
RBC: 3.95 MIL/uL — AB (ref 4.22–5.81)
RDW: 13.6 % (ref 11.5–15.5)
WBC: 7.1 10*3/uL (ref 4.0–10.5)

## 2014-11-25 MED ORDER — ATENOLOL 50 MG PO TABS: ORAL_TABLET | ORAL | Status: DC

## 2014-11-25 NOTE — Patient Instructions (Signed)
Your physician has recommended you make the following change in your medication: STOP the amlodipine.  Your physician recommends that you return for lab work and urinalysis today.  Your physician recommends that you schedule a follow-up appointment in: 4 months with Dr. Claiborne Billings.

## 2014-11-26 LAB — URINALYSIS
BILIRUBIN URINE: NEGATIVE
Glucose, UA: NEGATIVE
Hgb urine dipstick: NEGATIVE
KETONES UR: NEGATIVE
Leukocytes, UA: NEGATIVE
Nitrite: NEGATIVE
Protein, ur: NEGATIVE
SPECIFIC GRAVITY, URINE: 1.023 (ref 1.001–1.035)
pH: 5.5 (ref 5.0–8.0)

## 2014-11-27 ENCOUNTER — Encounter: Payer: Self-pay | Admitting: Cardiovascular Disease

## 2014-11-27 NOTE — Progress Notes (Signed)
Patient ID: [name], male   DOB: 09/17/1945, 69 y.o.   MRN: [id number]     HPI: [name] is a 69 y.o. male who is a former patient of Dr. Rollene Fare.  He presents for a 3 month follow-up cardiology evaluation.  Mr. Donny Heffern has an extensive cardiac and peripheral vascular history. He underwent initial CABG revascularization surgery in [place] in 1984. In 1996 he required re-do CABG revascularization surgery after all grafts were found to be occluded. At that time he also underwent right carotid endarterectomy. In 1998 he underwent stenting of his left main coronary artery and also had stenting done to the vein graft supplying the marginal vessel. He has history of right renal artery stenosis and underwent initial stenting with predilatation 2005. He has documented chronic right bundle branch block. He also significant peripheral vascular disease with bilateral SFA occlusion. He has known left subclavian artery occlusion with right subclavian stenosis. Has a history of hypertension, hyperlipidemia, and remotely smoked cigarettes. His last catheterization was done in November 2012 which showed significant native CAD with evidence for patent left main stent. He had an occluded circumflex marginal vessel from the AV groove circumflex. There was 80% ostial RCA stenosis with 90% stenosis in the mid right coronary artery proximal to the RV marginal branch and total occlusion of the mid RCA. An occluded vein graft supplying the right coronary artery but evidence for collateralization to the distal right artery the left coronary injection and injection of the grafts. The vein supplying the marginal vessel with the previously placed ostial proximal Taxus stent had in-stent narrowing of 50% followed by 95% stenosis in the body of the proximal third of the vessel graft prior to the previously placed mid grafts 3.0x20 mm Taxus stent. At that time, he underwent difficult but successful intervention involving  long segmental stenosis of the vein graft supplying the circumflex marginal vessel. Additional tandem 3.0x38 mm and 3.0x8 mm Taxus stents were inserted with excellent angiographic result. Since then, he has noticed significant improvement of his chest pain but he still notes occasional episodes of angina typically with the initiation of activity and oftentimes this improves after he "warms up." He is participating in cardiac rehabilitation at [facility].  He saw Dr. Rollene Fare in August 2014 per at that time due to concern for progressive carotid stenoses repeat Doppler studies were done and he also saw Dr. Sherren Mocha early for followup evaluation. Repeat imaging showed a patent right carotid endarterectomy site the Doppler velocity suggestive of 40  percent stenosis of the bilateral proximal internal carotid arteries. There was left subclavian steal noted with retrograde left vertebral artery flow, monophasic left subclavian artery flow and significant difference in the bilateral brachial pressures.  He established cardiology care with me in December 2014 December, he has continued to remain stable from a cardiac standpoint.  He is participating in cardiac rehabilitation at [facility] on Monday, Tuesday, and Wednesdays, and has been doing so for 10 years.  A nuclear perfusion study in May 2015 demonstrated mild inferobasal ischemia, most likely secondary to RCA distribution.    On 03/25/2014 he was seen by Dr. Martinique after he developed an episode of atrial fibrillation the day before.  An ECG in Dr. Latina Craver  office showed atrial fibrillation with rate of 120.  His atenolol was increased to 50 mg twice a day, and he converted to sinus rhythm after 18 hours.  When he saw Dr. Martinique the following day he was in  sinus rhythm.  On the increased dose of beta blocker he denies recurrent palpitations.  He tells me he recently underwent a colonoscopy and had 3 polyps removed, but also required cauterization for  another site that was oozing blood.  This reason, he was not restarted on anticoagulation but has been maintained on low-dose aspirin and Plavix.  Since I last saw him 3 minutes ago.  He admits to having an episode of fast heartbeat proximally 3 weeks ago.  He still notes rare bright red blood per rectum and sees Dr. Henrene Pastor.  He denies any chest pain.  He has been taking atenolol 75 mg in the morning and 50 mg at night, losartan 50 mg daily.  He is taking Ranexa 1000 g twice a day.  He rarely takes torsemide.  He is on lovastatin for hyperlipidemia.  He continues to take Nexium twice a day and amlodipine 2.5 mg daily.  Past Medical History  Diagnosis Date  . Coronary artery disease 01/24/11    Successful PCI long segmental stensois  vein graft to the CX marginal vessel-graft previously stented prox. 3.0x11m TAXUS stent mid seg 3.0x226mTAXUS stent now tandem stens of 3.0x3879m 3.0x8mm67maced with the seg. 50,80 & 99% stenosis reduced to 0%  . Hyperlipidemia   . Angina   . Myocardial infarction 1991  . Myocardial infarction 2005    "had 3 heart attacks this year"  . Dysrhythmia     "PAC's and PVC's"  . Bronchitis   . Shortness of breath     "when I had the heart attacks"  . Hiatal hernia   . GERD (gastroesophageal reflux disease)   . Ulcer     "small; Dr. PerrHenrene Pastornd it 02/2010"  . Headache(784.0)   . Chronic back pain   . DDD (degenerative disc disease)   . Bulging discs     "8 of them; thoracic, lumbar, sacral area"  . Blood transfusion   . Anemia   . Complication of anesthesia   . Esophageal dilatation     "have had it done 7 times; last time ~ 2001"  . Subclavian artery stenosis, left   . Colon polyps   . Esophageal stricture   . Pyloric stenosis   . S/P CABG (coronary artery bypass graft) 1984[place] artery stenosis   . RBBB   . Paroxysmal atrial fibrillation     Past Surgical History  Procedure Laterality Date  . Coronary  angioplasty with stent placement  01/24/11    "today makes a total of 9 stents"  . Coronary artery bypass graft  1984    CABG X 4  . Coronary artery bypass graft  1996    CABG X 4  . Appendectomy  1953  . Tonsillectomy  1972  . Adenoidectomy      "when I was an infant"  . Retinal detachment surgery  04/2007    right  . Pylondial cyst removal    . Carotid endarterectomy Right 1996    CE  . Left heart catheterization with coronary/graft angiogram N/A 01/24/2011    Procedure: LEFT HEART CATHETERIZATION WITH COROBeatrix Fettersurgeon: ThomTroy Sine;  [facility];  Service: Cardiovascular;  Laterality: N/A;    Allergies  Allergen Reactions  . Codeine Nausea And Vomiting  . Sulfonamide Derivatives     REACTION: rash    Current Outpatient Prescriptions  Medication Sig Dispense Refill  .  aspirin 81 MG tablet Take 81 mg by mouth daily.    Marland Kitchen atenolol (TENORMIN) 50 MG tablet Take 1 .5 tablets in the morning and 1 tablet at night 75 tablet 6  . butalbital-aspirin-caffeine (FIORINAL) 50-325-40 MG per capsule Take 1 capsule by mouth every 4 (four) hours as needed. FOR HEADACHE.     Marland Kitchen clopidogrel (PLAVIX) 75 MG tablet Take 1 tablet (75 mg total) by mouth daily. 90 tablet 3  . Coenzyme Q10 (CO Q-10) 100 MG CAPS Take by mouth.    . dutasteride (AVODART) 0.5 MG capsule Take 0.5 mg by mouth daily.     Marland Kitchen esomeprazole (NEXIUM) 40 MG capsule TAKE 1 CAPSULE TWICE DAILY 180 capsule 1  . loratadine (CLARITIN) 10 MG tablet Take 1 tablet by mouth daily as needed.  3  . losartan (COZAAR) 50 MG tablet TAKE 1 TABLET (50 MG TOTAL) BY MOUTH DAILY. 90 tablet 3  . lovastatin (MEVACOR) 40 MG tablet Take 1 tablet (40 mg total) by mouth at bedtime. 90 tablet 3  . Multiple Vitamins-Minerals (MULTIVITAMINS THER. W/MINERALS) TABS Take 1 tablet by mouth daily.      . nitroGLYCERIN (NITROLINGUAL) 0.4 MG/SPRAY spray Place 1 spray under the tongue every 5 (five) minutes x 3 doses as needed for  chest pain. 0.4 g 3  . ranolazine (RANEXA) 1000 MG SR tablet Take 1 tablet (1,000 mg total) by mouth 2 (two) times daily. 180 tablet 3  . torsemide (DEMADEX) 20 MG tablet Take 1 tablet (20 mg total) by mouth as needed. 90 tablet 1  . vitamin E 200 UNIT capsule Take 200 Units by mouth daily.       No current facility-administered medications for this visit.    Social History   Social History  . Marital Status: Married    Spouse Name: N/A  . Number of Children: N/A  . Years of Education: N/A   Occupational History  . Not on file.   Social History Main Topics  . Smoking status: Former Smoker -- 1.00 packs/day for 38 years    Types: Cigarettes    Quit date: 02/09/2005  . Smokeless tobacco: Never Used     Comment: "quit smoking cigarrettes 2006"  . Alcohol Use: No  . Drug Use: No  . Sexual Activity: Not on file   Other Topics Concern  . Not on file   Social History Narrative   Socially he is married has 2 children 2 stepchildren. He quit tobacco in 2000.  Family History  Problem Relation Age of Onset  . Colon cancer Neg Hx   . Breast cancer Paternal Aunt   . Diabetes Sister   . Ulcers Father   . Heart disease Father   . Heart failure Father   . Heart attack Father   . AAA (abdominal aortic aneurysm) Father   . Stroke Mother   . Hypertension Mother    ROS General: Negative; No fevers, chills, or night sweats;  HEENT: History of a detached retina in his right eye; No changes in  hearing, sinus congestion, difficulty swallowing Pulmonary: Negative; No cough, wheezing, shortness of breath, hemoptysis Cardiovascular: see HPI;  GI: s/p polypectomy and rare red blood per rectum followed by Dr. Henrene Pastor. GU: Occasional concentrated or  dark urine without blood Musculoskeletal: Negative; no myalgias, joint pain, or weakness Hematologic/Oncology: Negative; no easy bruising, bleeding Endocrine: Negative; no heat/cold intolerance; no diabetes Neuro: Negative; no changes in  balance, headaches Skin: Negative; No rashes or skin lesions Psychiatric: Negative;  No behavioral problems, depression Sleep: Negative; No snoring, daytime sleepiness, hypersomnolence, bruxism, restless legs, hypnogognic hallucinations, no cataplexy Other comprehensive 14 point system review is negative.   PE BP 110/52 mmHg  Pulse 58  Ht 5' 5"  (1.651 m)  Wt 142 lb 12.8 oz (64.774 kg)  BMI 23.76 kg/m2  Wt Readings from Last 3 Encounters:  11/25/14 142 lb 12.8 oz (64.774 kg)  08/01/14 139 lb (63.05 kg)  04/30/14 144 lb (65.318 kg)   General: Alert, oriented, no distress.  Skin: normal turgor, no rashes HEENT: Normocephalic, atraumatic. Pupils round and reactive; sclera anicteric;no lid lag.  Nose without nasal septal hypertrophy Mouth/Parynx benign; Mallinpatti scale 3 Neck: No JVD, bilateral carotid bruits, right greater than left  Lungs: clear to ausculatation and percussion; no wheezing or rales Chest wall: no tenderness to palpitation Heart: RRR, s1 s2 normal with a 2/6 systolic murmur along the left sternal border. No S3 or S4 gallop. No diastolic murmurs.  No S3 or S4 gallop Abdomen: soft, nontender; no hepatosplenomehaly, BS+; abdominal aorta nontender and not dilated by palpation. Back: no CVA tenderness Pulses 2+ Extremities:  Trace ankle edema bilaterally;no clubbing cyanosis, Homan's sign negative  Neurologic: grossly nonfocal Psychologic: normal affect and mood.  ECG (independently read by me): Sinus bradycardia 58 bpm.  Increased QTc interval 473 ms.  Right bundle-branch block.  May 2016 ECG (independently read by me): Sinus bradycardia 59 bpm with right bundle branch block.I ST-T changes.  QTc interval 475 ms.  PR interval 182 ms.  No ectopy.  February 2016 ECG (independently read by me) normal sinus rhythm at 63 bpm.  Right bundle-branch block.  Probable left posterior fascicular block.  PR interval 170 ms.  QTC interval 480 ms.  November 2015 ECG (independently  read by me): Normal sinus rhythm at 62 bpm.  Right bundle branch block.  Small nondiagnostic inferior Q waves.  Prior May 2015ECG : sinus rhythm at 58 beats per minute.  Right bundle branch block.  Prior December 2014 ECG: Sinus rhythm with sinus bradycardia 55 beats per minute. Right bundle branch block.  LABS: BMP Latest Ref Rng 11/25/2014 08/01/2014 01/25/2011  Glucose 65 - 99 mg/dL 89 96 97  BUN 7 - 25 mg/dL 26(H) 26(H) 16  Creatinine 0.70 - 1.18 mg/dL 1.06 1.21 1.25  Sodium 135 - 146 mmol/L 138 140 137  Potassium 3.5 - 5.3 mmol/L 4.8 4.6 4.4  Chloride 98 - 110 mmol/L 106 108 105  CO2 20 - 31 mmol/L 26 25 26   Calcium 8.6 - 10.3 mg/dL 8.7 9.2 8.4   Hepatic Function Latest Ref Rng 11/25/2014 08/01/2014 08/06/2009  Total Protein 6.1 - 8.1 g/dL 6.0(L) 6.2 6.7  Albumin 3.6 - 5.1 g/dL 3.9 3.9 4.1  AST 10 - 35 U/L 15 16 19   ALT 9 - 46 U/L 10 12 17   Alk Phosphatase 40 - 115 U/L 75 69 81  Total Bilirubin 0.2 - 1.2 mg/dL 0.3 0.4 0.6   CBC Latest Ref Rng 11/25/2014 01/25/2011 01/25/2011  WBC 4.0 - 10.5 K/uL 7.1 7.4 -  Hemoglobin 13.0 - 17.0 g/dL 13.1 9.8(L) 10.1(L)  Hematocrit 39.0 - 52.0 % 38.3(L) 28.0(L) 28.5(L)  Platelets 150 - 400 K/uL 188 120(L) -   Lab Results  Component Value Date   MCV 97.0 11/25/2014   MCV 94.0 01/25/2011   MCV 93.2 01/25/2011   Lab Results  Component Value Date   TSH 0.636 08/01/2014    Lipid Panel     Component Value Date/Time  CHOL 172 08/01/2014 0852   TRIG 90 08/01/2014 0852   HDL 66 08/01/2014 0852   CHOLHDL 2.6 08/01/2014 0852   VLDL 18 08/01/2014 0852   LDLCALC 88 08/01/2014 0852     RADIOLOGY: No results found.    ASSESSMENT AND PLAN: Mr. Tyke Outman is a 69 year old gentleman who has an extensive artery and peripheral vascular disease history, and is 32 years status post his initial CABG revascularization surgery done in [place] and 20 years status post his second CABG surgery. He has had multiple peripheral vascular procedures  including carotid endarterectomy, renal stenting, and is documented SFA occlusion. His last catheterization intervention was done in 2012 which demonstrated diffuse disease in the vein graft supplying the marginal vessel and 2 additional Taxus stents inserted commencing ostially and extending to the proximal portion of the recently placed mid prior stent. He has a known occluded graft to the RCA with a mid RCA occlusion with left to right collaterals. He also has subclavian steal. He does have mild hypokinesis inferolaterally in the basal inferior wall with an EF of 45% at that catheterization. He does have chronic right bundle branch block. He has not been demonstrated to have an abdominal aortic aneurysm. When I had seen him previously he had complained of mild chest anginal symptomatology typically when he starts exercise and particularly after eating. He had potential areas of myocardium which may be responsible for ischemia.  At that time, I added  Ranexa 500 mg twice a day to his medical regimen and subsequently this was further titrated to 1000 mg twice a day , which has resolved his symptomatology.  In January 2016 he was documented to have paroxysmal atrial fibrillation which responded to increased beta blocker therapy and lasted for ~18 hours.  He is now on an increased beta blocker regimen consisting of atenolol 75 mg in the morning and 50 mg at night, which was increased at his last office visit.  Because of GI issues.  He's not on anticoagulation, but continues to be on aspirin and Plavix.  He admits to ankle swelling.  His blood pressure today is stable, but he states at times his blood pressure is low.  I am recommending he discontinue amlodipine.  This will also help his ankle swelling.  He can take an extra half of atenolol if he notes recurrent tachycardia or palpitations.  Follow-up laboratory will be obtained.  I am also checking a urinalysis with his complaint of somewhat dark urine.  I will  contact him regarding the results.  I will see him in 4 months for cardiology evaluation.  Time spent: 25 minutes   Troy Sine, MD, [facility]  11/27/2014 6:11 PM

## 2014-12-16 ENCOUNTER — Encounter (HOSPITAL_COMMUNITY): Payer: Medicare Other

## 2014-12-16 ENCOUNTER — Encounter: Payer: Self-pay | Admitting: Family

## 2014-12-16 ENCOUNTER — Ambulatory Visit: Payer: Medicare Other | Admitting: Family

## 2014-12-17 ENCOUNTER — Ambulatory Visit (HOSPITAL_COMMUNITY)
Admission: RE | Admit: 2014-12-17 | Discharge: 2014-12-17 | Disposition: A | Discharge status: Home / Self Care | Payer: Medicare Other | Source: Ambulatory Visit | Attending: Family | Admitting: Family

## 2014-12-17 ENCOUNTER — Encounter: Payer: Self-pay | Admitting: Family

## 2014-12-17 ENCOUNTER — Ambulatory Visit (INDEPENDENT_AMBULATORY_CARE_PROVIDER_SITE_OTHER): Payer: Medicare Other | Admitting: Family

## 2014-12-17 VITALS — BP 87/59 | HR 57 | Ht 65.0 in | Wt 142.0 lb

## 2014-12-17 DIAGNOSIS — E785 Hyperlipidemia, unspecified: Secondary | ICD-10-CM | POA: Insufficient documentation

## 2014-12-17 DIAGNOSIS — I779 Disorder of arteries and arterioles, unspecified: Secondary | ICD-10-CM | POA: Insufficient documentation

## 2014-12-17 DIAGNOSIS — G458 Other transient cerebral ischemic attacks and related syndromes: Secondary | ICD-10-CM | POA: Diagnosis not present

## 2014-12-17 DIAGNOSIS — Z9889 Other specified postprocedural states: Secondary | ICD-10-CM | POA: Diagnosis not present

## 2014-12-17 DIAGNOSIS — Z48812 Encounter for surgical aftercare following surgery on the circulatory system: Secondary | ICD-10-CM

## 2014-12-17 DIAGNOSIS — I739 Peripheral vascular disease, unspecified: Secondary | ICD-10-CM

## 2014-12-17 NOTE — Progress Notes (Signed)
Established Carotid Patient   History of Present Illness  [name] is a 69 y.o. male patient of Dr. Donnetta Hutching who is s/p right carotid endarterectomy by Dr. Donnetta Hutching in 1996. He did well since that time. He returns today for carotid artery surveillance and evaluaion. He does have a known moderate left carotid stenosis and is being followed with serial ultrasounds. He does have history of significant coronary disease with multiple prior stenting procedures and myocardial infarctions. He is extremely active in a walking program and is comfortable with this. He does have known superficial femoral artery occlusive disease, but he no longer has claudication symptoms in his legs since he has been exercising so much.  He denies any history of stroke or TIA, specifically he denies amaurosis fugax or monocular blindness, denies facial drooping, denies hemiplegia, denies receptive or expressive aphasia.   He denies any steal symptoms in either upper extremity, denies cold sensation in either upper extremity, denies pain in either extremity. He denies feeling dizzy, but does feel off balance if he turns quickly; states he has tinnitus.   He also reports that he has known sacral, lumbar, and thoracic "bulging discs", degenerative disc disease, found on CT of his back, per patient. He also did years of heavy lifting as an EMT. He also reports that he has scoliosis.  He does cardiac rehab/wellness program 3 days/week since 2005, states this has helped his PAD, he no longer has claudication symptoms in his legs with walking.  Pt states that his cardiologist is monitoring his aorta, iliac arteries, renal arteries, and checks ABI's regularly.   Pt denies any recent chest pain, last chest pain episode was in 2012, had a cardiac stent placed then; states in 1996 his LIMA was used as a bypass vessel. He had 4 vessel CABG in the 1980's and again in 1996.  Pt reports New Medical or Surgical History: newly  diagnosed atrial fib, metoprolol dose increased by Dr. Claiborne Billings; he was not started on an anticoagulant since it was found that he has a small GI bleed.  Pt Diabetic: No Pt smoker: former smoker, quit in 2006  Pt meds include: Statin : Yes ASA: Yes Other anticoagulants/antiplatelets: Plavix   Past Medical History  Diagnosis Date  . Coronary artery disease 01/24/11    Successful PCI long segmental stensois  vein graft to the CX marginal vessel-graft previously stented prox. 3.0x75mm TAXUS stent mid seg 3.0x48mm TAXUS stent now tandem stens of 3.0x54mm & 3.0x81mm placed with the seg. 50,80 & 99% stenosis reduced to 0%  . Hyperlipidemia   . Angina   . Myocardial infarction ([facility] [place]) 1991  . Myocardial infarction [facility]) 2005    "had 3 heart attacks this year"  . Dysrhythmia     "PAC's and PVC's"  . Bronchitis   . Shortness of breath     "when I had the heart attacks"  . Hiatal hernia   . GERD (gastroesophageal reflux disease)   . Ulcer     "small; Dr. Henrene Pastor found it 02/2010"  . Headache(784.0)   . Chronic back pain   . DDD (degenerative disc disease)   . Bulging discs     "8 of them; thoracic, lumbar, sacral area"  . Blood transfusion   . Anemia   . Complication of anesthesia   . Esophageal dilatation     "have had it done 7 times; last time ~ 2001"  . Subclavian artery stenosis, left   . Colon polyps   . Esophageal  stricture   . Pyloric stenosis   . S/P CABG (coronary artery bypass graft) [place]  . Renal artery stenosis ([place])   . RBBB   . Paroxysmal atrial fibrillation (HCC)   . Atrial fibrillation [facility])     Social History Social History  Substance Use Topics  . Smoking status: Former Smoker -- 1.00 packs/day for 38 years    Types: Cigarettes    Quit date: 02/09/2005  . Smokeless tobacco: Never Used     Comment: "quit smoking cigarrettes 2006"  . Alcohol Use: No    Family History Family History  Problem Relation Age of Onset   . Colon cancer Neg Hx   . Breast cancer Paternal Aunt   . Diabetes Sister   . Ulcers Father   . Heart disease Father   . Heart failure Father   . Heart attack Father   . AAA (abdominal aortic aneurysm) Father   . Stroke Mother   . Hypertension Mother     Surgical History Past Surgical History  Procedure Laterality Date  . Coronary angioplasty with stent placement  01/24/11    "today makes a total of 9 stents"  . Coronary artery bypass graft  1984    CABG X 4  . Coronary artery bypass graft  1996    CABG X 4  . Appendectomy  1953  . Tonsillectomy  1972  . Adenoidectomy      "when I was an infant"  . Retinal detachment surgery  04/2007    right  . Pylondial cyst removal    . Carotid endarterectomy Right 1996    CE  . Left heart catheterization with coronary/graft angiogram N/A 01/24/2011    Procedure: LEFT HEART CATHETERIZATION WITH Beatrix Fetters;  Surgeon: Troy Sine, MD;  Location: [facility] CATH LAB;  Service: Cardiovascular;  Laterality: N/A;    Allergies  Allergen Reactions  . Codeine Nausea And Vomiting  . Sulfonamide Derivatives     REACTION: rash    Current Outpatient Prescriptions  Medication Sig Dispense Refill  . aspirin 81 MG tablet Take 81 mg by mouth daily.    Marland Kitchen atenolol (TENORMIN) 50 MG tablet Take 1 .5 tablets in the morning and 1 tablet at night 75 tablet 6  . butalbital-aspirin-caffeine (FIORINAL) 50-325-40 MG per capsule Take 1 capsule by mouth every 4 (four) hours as needed. FOR HEADACHE.     Marland Kitchen clopidogrel (PLAVIX) 75 MG tablet Take 1 tablet (75 mg total) by mouth daily. 90 tablet 3  . Coenzyme Q10 (CO Q-10) 100 MG CAPS Take by mouth.    . dutasteride (AVODART) 0.5 MG capsule Take 0.5 mg by mouth daily.     Marland Kitchen esomeprazole (NEXIUM) 40 MG capsule TAKE 1 CAPSULE TWICE DAILY 180 capsule 1  . loratadine (CLARITIN) 10 MG tablet Take 1 tablet by mouth daily as needed.  3  . losartan (COZAAR) 50 MG tablet TAKE 1 TABLET (50 MG TOTAL) BY MOUTH  DAILY. 90 tablet 3  . lovastatin (MEVACOR) 40 MG tablet Take 1 tablet (40 mg total) by mouth at bedtime. 90 tablet 3  . Multiple Vitamins-Minerals (MULTIVITAMINS THER. W/MINERALS) TABS Take 1 tablet by mouth daily.      . nitroGLYCERIN (NITROLINGUAL) 0.4 MG/SPRAY spray Place 1 spray under the tongue every 5 (five) minutes x 3 doses as needed for chest pain. 0.4 g 3  . ranolazine (RANEXA) 1000 MG SR tablet Take 1 tablet (1,000 mg total)  by mouth 2 (two) times daily. 180 tablet 3  . torsemide (DEMADEX) 20 MG tablet Take 1 tablet (20 mg total) by mouth as needed. 90 tablet 1  . vitamin E 200 UNIT capsule Take 200 Units by mouth daily.       No current facility-administered medications for this visit.    Review of Systems : See HPI for pertinent positives and negatives.  Physical Examination  Filed Vitals:   12/17/14 1434 12/17/14 1435  BP: 130/62 87/59  Pulse: 57   Height: 5\' 5"  (1.651 m)   Weight: 142 lb (64.411 kg)   SpO2: 98%    Body mass index is 23.63 kg/(m^2).  General: WDWN male in NAD GAIT: normal Eyes: Pupils equal Pulmonary: Non-labored, CTAB, no rales, no rhonchi, & no wheezing.  Cardiac: regular rhythm, no detected murmur.  VASCULAR EXAM Carotid Bruits Right Left   Positive Positive   Aorta is palpable. Radial pulses are 2+ right and 1+ left palpable.      LE Pulses Right Left   FEMORAL 1+ palpable 2+ palpable    POPLITEAL not palpable  not palpable   POSTERIOR TIBIAL not palpable  not palpable    DORSALIS PEDIS  ANTERIOR TIBIAL not palpable  not palpable     Gastrointestinal: soft, nontender, BS WNL, no r/g,no palpable masses.   Musculoskeletal: Negative muscle atrophy/wasting. M/S 5/5 throughout, Extremities without ischemic changes. Trace right and 1+ left ankle pitting  edema.  Neurologic: A&O X 3; Appropriate Affect, Speech is normal CN 2-12 intact, Pain and light touch intact in extremities, Motor exam as listed above.            Non-Invasive Vascular Imaging CAROTID DUPLEX 12/17/2014   Right ICA: 40 - 59 % stenosis. Left ICA: 40 - 59 % stenosis.  No significant stenosis of either external carotid artery.  Monophasic left subclavian artery and retrograde left vertebral artery flow; greater than 20 mm Hg difference in the bilateral brachial pressures. No significant change compared to 12/10/13 exam.   Assessment: DARRELD DURRELL is a 69 y.o. male who is s/p right carotid endarterectomy in 1996 and has known left subclavian artery steal.  He has no remote nor recent history of stroke or TIA.  Today's carotid Duplex suggests 40-59% bilateral ICA stenoses.  No significant stenosis of either external carotid artery.  Monophasic left subclavian artery and retrograde left vertebral artery flow; greater than 20 mm Hg difference in the bilateral brachial pressures. No significant change compared to 12/10/13 exam.   Pt states that his cardiologist is monitoring his aorta, iliac arteries, renal arteries, and checks ABI's regularly.   I discussed with Dr. Scot Dock pt's feeling off balance only when he turns quickly, retrograde left vertebral artery, left subclavian stenosis, over 20 mmHg difference in brachial systolic pressures, no chest pain since 2012, and his 4 vessel CABG in the 1980's and 1990's, use of his LIMA in the 1990's.  Since he is not having chest pain the minimally symptomatic left subclavian steal does not need addressing at this time; this has been stable over time.   Plan: Follow-up in 1 year with Carotid Duplex scan.    I discussed in depth with the patient the nature of atherosclerosis, and emphasized the importance of maximal medical management including strict control of blood pressure, blood glucose, and lipid levels, obtaining  regular exercise, and continued cessation of smoking.  The patient is aware that without maximal medical management the underlying atherosclerotic disease process will progress,  limiting the benefit of any interventions. The patient was given information about stroke prevention and what symptoms should prompt the patient to seek immediate medical care. Thank you for allowing [place] to participate in this patient's care.  Clemon Chambers, RN, MSN, FNP-C [facility] Office: [phone]  Clinic Physician: Scot Dock  12/17/2014 2:38 PM

## 2014-12-17 NOTE — Patient Instructions (Signed)
Stroke Prevention Some medical conditions and behaviors are associated with an increased chance of having a stroke. You may prevent a stroke by making healthy choices and managing medical conditions. HOW CAN I REDUCE MY RISK OF HAVING A STROKE?   Stay physically active. Get at least 30 minutes of activity on most or all days.  Do not smoke. It may also be helpful to avoid exposure to secondhand smoke.  Limit alcohol use. Moderate alcohol use is considered to be:  No more than 2 drinks per day for men.  No more than 1 drink per day for nonpregnant women.  Eat healthy foods. This involves:  Eating 5 or more servings of fruits and vegetables a day.  Making dietary changes that address high blood pressure (hypertension), high cholesterol, diabetes, or obesity.  Manage your cholesterol levels.  Making food choices that are high in fiber and low in saturated fat, trans fat, and cholesterol may control cholesterol levels.  Take any prescribed medicines to control cholesterol as directed by your health care provider.  Manage your diabetes.  Controlling your carbohydrate and sugar intake is recommended to manage diabetes.  Take any prescribed medicines to control diabetes as directed by your health care provider.  Control your hypertension.  Making food choices that are low in salt (sodium), saturated fat, trans fat, and cholesterol is recommended to manage hypertension.  Ask your health care provider if you need treatment to lower your blood pressure. Take any prescribed medicines to control hypertension as directed by your health care provider.  If you are 18-39 years of age, have your blood pressure checked every 3-5 years. If you are 40 years of age or older, have your blood pressure checked every year.  Maintain a healthy weight.  Reducing calorie intake and making food choices that are low in sodium, saturated fat, trans fat, and cholesterol are recommended to manage  weight.  Stop drug abuse.  Avoid taking birth control pills.  Talk to your health care provider about the risks of taking birth control pills if you are over 35 years old, smoke, get migraines, or have ever had a blood clot.  Get evaluated for sleep disorders (sleep apnea).  Talk to your health care provider about getting a sleep evaluation if you snore a lot or have excessive sleepiness.  Take medicines only as directed by your health care provider.  For some people, aspirin or blood thinners (anticoagulants) are helpful in reducing the risk of forming abnormal blood clots that can lead to stroke. If you have the irregular heart rhythm of atrial fibrillation, you should be on a blood thinner unless there is a good reason you cannot take them.  Understand all your medicine instructions.  Make sure that other conditions (such as anemia or atherosclerosis) are addressed. SEEK IMMEDIATE MEDICAL CARE IF:   You have sudden weakness or numbness of the face, arm, or leg, especially on one side of the body.  Your face or eyelid droops to one side.  You have sudden confusion.  You have trouble speaking (aphasia) or understanding.  You have sudden trouble seeing in one or both eyes.  You have sudden trouble walking.  You have dizziness.  You have a loss of balance or coordination.  You have a sudden, severe headache with no known cause.  You have new chest pain or an irregular heartbeat. Any of these symptoms may represent a serious problem that is an emergency. Do not wait to see if the symptoms will   go away. Get medical help at once. Call your local emergency services (911 in [place] Do not drive yourself to the hospital.   This information is not intended to replace advice given to you by your health care provider. Make sure you discuss any questions you have with your health care provider.   Document Released: 03/31/2004 Document Revised: 03/14/2014 Document Reviewed:  08/24/2012 Elsevier Interactive Patient Education 2016 Elsevier Inc.  

## 2014-12-21 ENCOUNTER — Other Ambulatory Visit: Payer: Self-pay | Admitting: Internal Medicine

## 2014-12-29 ENCOUNTER — Other Ambulatory Visit: Payer: Self-pay | Admitting: Cardiovascular Disease

## 2015-02-02 ENCOUNTER — Other Ambulatory Visit: Payer: Self-pay | Admitting: Cardiovascular Disease

## 2015-02-02 DIAGNOSIS — I701 Atherosclerosis of renal artery: Secondary | ICD-10-CM

## 2015-02-12 ENCOUNTER — Ambulatory Visit (HOSPITAL_COMMUNITY)
Admission: RE | Admit: 2015-02-12 | Discharge: 2015-02-12 | Disposition: A | Discharge status: Home / Self Care | Payer: Medicare Other | Source: Ambulatory Visit | Attending: Cardiovascular Disease | Admitting: Cardiovascular Disease

## 2015-02-12 DIAGNOSIS — E785 Hyperlipidemia, unspecified: Secondary | ICD-10-CM | POA: Insufficient documentation

## 2015-02-12 DIAGNOSIS — I700 Atherosclerosis of aorta: Secondary | ICD-10-CM | POA: Insufficient documentation

## 2015-02-12 DIAGNOSIS — I708 Atherosclerosis of other arteries: Secondary | ICD-10-CM | POA: Insufficient documentation

## 2015-02-12 DIAGNOSIS — I774 Celiac artery compression syndrome: Secondary | ICD-10-CM | POA: Insufficient documentation

## 2015-02-12 DIAGNOSIS — I701 Atherosclerosis of renal artery: Secondary | ICD-10-CM | POA: Insufficient documentation

## 2015-02-12 DIAGNOSIS — I10 Essential (primary) hypertension: Secondary | ICD-10-CM | POA: Diagnosis not present

## 2015-02-13 ENCOUNTER — Other Ambulatory Visit: Payer: Self-pay | Admitting: Cardiovascular Disease

## 2015-02-19 ENCOUNTER — Telehealth: Payer: Self-pay | Admitting: *Deleted

## 2015-02-19 ENCOUNTER — Encounter: Payer: Self-pay | Admitting: Cardiovascular Disease

## 2015-02-19 ENCOUNTER — Ambulatory Visit (INDEPENDENT_AMBULATORY_CARE_PROVIDER_SITE_OTHER): Payer: Medicare Other | Admitting: Cardiovascular Disease

## 2015-02-19 VITALS — BP 124/70 | HR 63 | Ht 65.0 in | Wt 143.2 lb

## 2015-02-19 DIAGNOSIS — I701 Atherosclerosis of renal artery: Secondary | ICD-10-CM | POA: Diagnosis not present

## 2015-02-19 DIAGNOSIS — I739 Peripheral vascular disease, unspecified: Secondary | ICD-10-CM

## 2015-02-19 NOTE — Telephone Encounter (Signed)
Follow Up ° ° ° ° °Pt is returning call from earlier. Please call. °

## 2015-02-19 NOTE — Patient Instructions (Signed)
Your physician has requested that you have a renal artery duplex in 6 months. During this test, an ultrasound is used to evaluate blood flow to the kidneys. Allow one hour for this exam. Do not eat after midnight the day before and avoid carbonated beverages. Take your medications as you usually do.  Dr Gwenlyn Found recommends that you schedule a follow-up appointment in after your doppler. You will receive a reminder letter in the mail two months in advance. If you don't receive a letter, please call our office to schedule the follow-up appointment.  If you need a refill on your cardiac medications before your next appointment, please call your pharmacy.

## 2015-02-19 NOTE — Progress Notes (Signed)
02/19/2015 [name]   10-Feb-1946  [id number]  Primary Physician Garwin Brothers, MD Primary Cardiologist: [name] MD Renae Gloss   HPI:  [name] is a very pleasant 69 year old thin-appearing Caucasian male patient of [name], formally a patient [name]. He has a long extensive vascular history back to 1994 when he had coronary bypass grafting at [facility]. He had redo bypass grafting at [facility] January 1996 with simultaneous right carotid endarterectomy performed by Dr. Donnetta Hutching. His other problems include hypertension, hyperlipidemia, peripheral vascular disease with occluded SFAs although with cardiac rehabilitation he no longer has claudication. He had right renal artery stenting by myself and Dr. Rollene Fare approximately 15 years ago with repeat intervention several years thereafter because of what sounds like re-in-stent restenosis. He's had annual renal Doppler studies which have shown progressive left renal artery stenosis with slight decrease in the left kidney dimensions. His blood pressures have been well-controlled and his renal function is normal.   Current Outpatient Prescriptions  Medication Sig Dispense Refill  . aspirin 81 MG tablet Take 81 mg by mouth daily.    Marland Kitchen atenolol (TENORMIN) 50 MG tablet Take 1 .5 tablets in the morning and 1 tablet at night 75 tablet 6  . butalbital-aspirin-caffeine (FIORINAL) 50-325-40 MG per capsule Take 1 capsule by mouth every 4 (four) hours as needed. FOR HEADACHE.     Marland Kitchen clopidogrel (PLAVIX) 75 MG tablet Take 1 tablet (75 mg total) by mouth daily. 90 tablet 3  . Coenzyme Q10 (CO Q-10) 100 MG CAPS Take by mouth.    . dutasteride (AVODART) 0.5 MG capsule Take 0.5 mg by mouth daily.     Marland Kitchen esomeprazole (NEXIUM) 40 MG capsule TAKE 1 CAPSULE TWICE DAILY 180 capsule 1  . loratadine (CLARITIN) 10 MG tablet Take 1 tablet by mouth daily as needed.  3  . losartan (COZAAR) 50 MG tablet TAKE 1 TABLET (50 MG  TOTAL) BY MOUTH DAILY. 90 tablet 0  . lovastatin (MEVACOR) 40 MG tablet Take 1 tablet (40 mg total) by mouth at bedtime. 90 tablet 3  . Multiple Vitamins-Minerals (MULTIVITAMINS THER. W/MINERALS) TABS Take 1 tablet by mouth daily.      . nitroGLYCERIN (NITROLINGUAL) 0.4 MG/SPRAY spray Place 1 spray under the tongue every 5 (five) minutes x 3 doses as needed for chest pain. 0.4 g 3  . ranolazine (RANEXA) 1000 MG SR tablet Take 1 tablet (1,000 mg total) by mouth 2 (two) times daily. 180 tablet 3  . torsemide (DEMADEX) 20 MG tablet Take 1 tablet (20 mg total) by mouth as needed. 90 tablet 1  . vitamin E 200 UNIT capsule Take 200 Units by mouth daily.       No current facility-administered medications for this visit.    Allergies  Allergen Reactions  . Codeine Nausea And Vomiting  . Sulfonamide Derivatives     REACTION: rash    Social History   Social History  . Marital Status: Married    Spouse Name: N/A  . Number of Children: N/A  . Years of Education: N/A   Occupational History  . Not on file.   Social History Main Topics  . Smoking status: Former Smoker -- 1.00 packs/day for 38 years    Types: Cigarettes    Quit date: 02/09/2005  . Smokeless tobacco: Never Used     Comment: "quit smoking cigarrettes 2006"  . Alcohol Use: No  . Drug Use: No  . Sexual Activity:  Not on file   Other Topics Concern  . Not on file   Social History Narrative     Review of Systems: General: negative for chills, fever, night sweats or weight changes.  Cardiovascular: negative for chest pain, dyspnea on exertion, edema, orthopnea, palpitations, paroxysmal nocturnal dyspnea or shortness of breath Dermatological: negative for rash Respiratory: negative for cough or wheezing Urologic: negative for hematuria Abdominal: negative for nausea, vomiting, diarrhea, bright red blood per rectum, melena, or hematemesis Neurologic: negative for visual changes, syncope, or dizziness All other systems  reviewed and are otherwise negative except as noted above.    Blood pressure 124/70, pulse 63, height 5\' 5"  (1.651 m), weight 143 lb 3.2 oz (64.955 kg), SpO2 98 %.  General appearance: alert and no distress Neck: no adenopathy, no JVD, supple, symmetrical, trachea midline, thyroid not enlarged, symmetric, no tenderness/mass/nodules and loud bilateral carotid bruits Lungs: clear to auscultation bilaterally Heart: regular rate and rhythm, S1, S2 normal, no murmur, click, rub or gallop Extremities: extremities normal, atraumatic, no cyanosis or edema  EKG not performed today  ASSESSMENT AND PLAN:   PVD (peripheral vascular disease) History of prior right renal artery stenting by myself and Dr. Rollene Fare approximately 15 years ago with recent intervention sometime thereafter for "in-stent restenosis. We've been following his duplex ultrasound on annual basis. His most recent Dopplers performed 02/12/15 revealed stable right renal artery stenosis and progressive increase in velocities and slight decrease in left renal dimension suggesting progressive left renal artery stenosis. His renal function is stable as is his blood pressure. We will continue to follow hematocrits ultrasound semiannually and I will reserve  Intervention for renal preservation.      [name] MD FACP,FACC,FAHA, [facility] 02/19/2015 5:07 PM

## 2015-02-19 NOTE — Assessment & Plan Note (Signed)
History of prior right renal artery stenting by myself and Dr. Rollene Fare approximately 15 years ago with recent intervention sometime thereafter for "in-stent restenosis. We've been following his duplex ultrasound on annual basis. His most recent Dopplers performed 02/12/15 revealed stable right renal artery stenosis and progressive increase in velocities and slight decrease in left renal dimension suggesting progressive left renal artery stenosis. His renal function is stable as is his blood pressure. We will continue to follow hematocrits ultrasound semiannually and I will reserve  Intervention for renal preservation.

## 2015-02-19 NOTE — Telephone Encounter (Signed)
Returned a call to patient. Discussed renal doppler results and recommendations. Appointment given to see Dr Gwenlyn Found today for evaluation per Dr Claiborne Billings.

## 2015-02-19 NOTE — Telephone Encounter (Signed)
-----   Message from Troy Sine, MD sent at 02/16/2015  4:53 PM EST -----  Normal caliber abdominal aorta. Aorto-iliac atherosclerosis, without stenosis. >70% celiac and SMA stenosis.  Normal bilateral kidney size, with a decrease in size on the left from prior exam of 1.4 cm. >60% bilateral renal artery stenosis, s/r right renal artery stent. Patent IVC and bilateral renal veins. Consider PV eval with JB

## 2015-02-23 ENCOUNTER — Other Ambulatory Visit: Payer: Self-pay | Admitting: Cardiovascular Disease

## 2015-03-18 ENCOUNTER — Other Ambulatory Visit: Payer: Self-pay | Admitting: Cardiovascular Disease

## 2015-03-18 NOTE — Telephone Encounter (Signed)
Rx request sent to pharmacy.  

## 2015-03-28 ENCOUNTER — Other Ambulatory Visit: Payer: Self-pay | Admitting: Cardiovascular Disease

## 2015-03-30 NOTE — Telephone Encounter (Signed)
Rx(s) sent to pharmacy electronically.  

## 2015-04-10 ENCOUNTER — Other Ambulatory Visit: Payer: Self-pay | Admitting: Cardiovascular Disease

## 2015-04-10 NOTE — Telephone Encounter (Signed)
Rx request sent to pharmacy.  

## 2015-04-14 ENCOUNTER — Ambulatory Visit (INDEPENDENT_AMBULATORY_CARE_PROVIDER_SITE_OTHER): Payer: Medicare Other | Admitting: Cardiovascular Disease

## 2015-04-14 ENCOUNTER — Encounter: Payer: Self-pay | Admitting: Cardiovascular Disease

## 2015-04-14 VITALS — BP 110/60 | HR 61 | Ht 65.0 in | Wt 143.0 lb

## 2015-04-14 DIAGNOSIS — E785 Hyperlipidemia, unspecified: Secondary | ICD-10-CM | POA: Diagnosis not present

## 2015-04-14 DIAGNOSIS — I4891 Unspecified atrial fibrillation: Secondary | ICD-10-CM | POA: Diagnosis not present

## 2015-04-14 DIAGNOSIS — I10 Essential (primary) hypertension: Secondary | ICD-10-CM | POA: Diagnosis not present

## 2015-04-14 NOTE — Patient Instructions (Signed)
Your physician recommends that you return for lab work fasting.  Your physician recommends that you schedule a follow-up appointment in: September with Dr Claiborne Billings.

## 2015-04-16 ENCOUNTER — Encounter: Payer: Self-pay | Admitting: Cardiovascular Disease

## 2015-04-16 NOTE — Progress Notes (Signed)
Patient ID: [name], male   DOB: 1945/10/07, 70 y.o.   MRN: [id number]    Primary MD:  Dr. Garwin Brothers  HPI: [name] is a 70 y.o. male who is a former patient of Dr. Rollene [name].  He presents for a 5 month follow-up cardiology evaluation.  [name] has an extensive cardiac and peripheral vascular history. He underwent initial CABG revascularization surgery in [name] in 1984. In 1996 he required re-do CABG revascularization surgery after all grafts were [name] to be occluded. At that time he also underwent right carotid endarterectomy. In 1998 he underwent stenting of his left main coronary artery and also had stenting done to the vein graft supplying the marginal vessel. He has history of right renal artery stenosis and underwent initial stenting with predilatation 2005. He has documented chronic right bundle branch block. He also significant peripheral vascular disease with bilateral SFA occlusion. He has known left subclavian artery occlusion with right subclavian stenosis. Has a history of hypertension, hyperlipidemia, and remotely smoked cigarettes. His last catheterization was done in November 2012 which showed significant native CAD with evidence for patent left main stent. He had an occluded circumflex marginal vessel from the AV groove circumflex. There was 80% ostial RCA stenosis with 90% stenosis in the mid right coronary artery proximal to the RV marginal branch and total occlusion of the mid RCA. An occluded vein graft supplying the right coronary artery but evidence for collateralization to the distal right artery the left coronary injection and injection of the grafts. The vein supplying the marginal vessel with the previously placed ostial proximal Taxus stent had in-stent narrowing of 50% followed by 95% stenosis in the body of the proximal third of the vessel graft prior to the previously placed mid grafts 3.0x20 mm Taxus stent. At that time, he underwent difficult but  successful intervention involving long segmental stenosis of the vein graft supplying the circumflex marginal vessel. Additional tandem 3.0x38 mm and 3.0x8 mm Taxus stents were inserted with excellent angiographic result.   He saw Dr. Rollene [name] in August 2014 per at that time due to concern for progressive carotid stenoses repeat Doppler studies were done and he also saw Dr. Sherren [name] early for followup evaluation. Repeat imaging showed a patent right carotid endarterectomy site the Doppler velocity suggestive of 40  percent stenosis of the bilateral proximal internal carotid arteries. There was left subclavian steal noted with retrograde left vertebral artery flow, monophasic left subclavian artery flow and significant difference in the bilateral brachial pressures.  He established cardiology care with me in December 2014 December, he has continued to remain stable from a cardiac standpoint.  He is participating in cardiac rehabilitation at [name] on Monday, Tuesday, and Wednesdays, and has been doing so for 10 years.  A nuclear perfusion study in May 2015 demonstrated mild inferobasal ischemia, most likely secondary to RCA distribution.    On 03/25/2014 he was seen by Dr. Martinique after he developed an episode of atrial fibrillation the day before.  An ECG in Dr. Latina [name]  office showed atrial fibrillation with rate of 120.  His atenolol was increased to 50 mg twice a day, and he converted to sinus rhythm after 18 hours.  When he saw Dr. Martinique the following day he was in sinus rhythm.  On the increased dose of beta blocker he denies recurrent palpitations.  He tells me he recently underwent a colonoscopy and had 3 polyps removed, but also required cauterization for another site  that was oozing blood.  This reason, he was not restarted on anticoagulation but has been maintained on low-dose aspirin and Plavix.  Since I last saw him, he states that he has felt well from a cardiac standpoint.  He only notes  very rare midsternal burning.  If his heart rate gets above 120 bpm.  His palpitations have improved.  He says his heart rate typically runs in the 50s.  He continues to participate in cardiac rehabilitation 3 days per week at [name].  He had seen Dr. Gwenlyn [name] in follow-up of his PVD and undergoes periodic Doppler assessment of his renal arteries and carotids.  He is status post right carotid endarterectomy.  He presents for follow-up oncologic evaluation.  Past Medical History  Diagnosis Date  . Coronary artery disease 01/24/11    Successful PCI long segmental stensois  vein graft to the CX marginal vessel-graft previously stented prox. 3.0x2m TAXUS stent mid seg 3.0x254mTAXUS stent now tandem stens of 3.0x3861m 3.0x8mm30maced with the seg. 50,80 & 99% stenosis reduced to 0%  . Hyperlipidemia   . Angina   . Myocardial infarction (HCC)[id number]  [facility]    "had 3 heart attacks this year"  . Dysrhythmia     "PAC's and PVC's"  . Bronchitis   . Shortness of breath     "when I had the heart attacks"  . Hiatal hernia   . GERD (gastroesophageal reflux disease)   . Ulcer     "small; Dr. PerrHenrene Pastornd it 02/2010"  . Headache(784.0)   . Chronic back pain   . DDD (degenerative disc disease)   . Bulging discs     "8 of them; thoracic, lumbar, sacral area"  . Blood transfusion   . Anemia   . Complication of anesthesia   . Esophageal dilatation     "have had it done 7 times; last time ~ 2001"  . Subclavian artery stenosis, left   . Colon polyps   . Esophageal stricture   . Pyloric stenosis   . S/P CABG (coronary artery bypass graft) [place]     hhistory of right renal artery stenting and re-intervention for "in-stent restenosis.  . RBMarland KitchenB   . Paroxysmal atrial fibrillation (HCC)   . [facility]  Past Surgical History  Procedure Laterality Date  . Coronary angioplasty  with stent placement  01/24/11    "today makes a total of 9 stents"  . Coronary artery bypass graft  1984    CABG X 4  . Coronary artery bypass graft  1996    CABG X 4  . Appendectomy  1953  . Tonsillectomy  1972  . Adenoidectomy      "when I was an infant"  . Retinal detachment surgery  04/2007    right  . Pylondial cyst removal    . Carotid endarterectomy Right 1996    CE  . Left heart catheterization with coronary/graft angiogram N/A 01/24/2011    Procedure: LEFT HEART CATHETERIZATION WITH COROBeatrix Fettersurgeon: ThomTroy Sine;  [facility];  Service: Cardiovascular;  Laterality: N/A;    Allergies  Allergen Reactions  . Codeine Nausea And Vomiting  . Sulfonamide Derivatives     REACTION: rash    Current Outpatient Prescriptions  Medication Sig Dispense Refill  . amLODipine (NORVASC) 2.5 MG tablet TAKE 1 TABLET  EVERY DAY 90 tablet 0  . aspirin 81 MG tablet Take 81 mg by mouth daily.    Marland Kitchen atenolol (TENORMIN) 50 MG tablet Take 1 .5 tablets in the morning and 1 tablet at night 75 tablet 6  . atenolol (TENORMIN) 50 MG tablet TAKE 1 AND 1/2 TABLETS EVERY DAY IN THE MORNING AND TAKE 1 TABLET IN THE EVENING 225 tablet 1  . butalbital-aspirin-caffeine (FIORINAL) 50-325-40 MG per capsule Take 1 capsule by mouth every 4 (four) hours as needed. FOR HEADACHE.     Marland Kitchen clopidogrel (PLAVIX) 75 MG tablet TAKE 1 TABLET (75 MG TOTAL) BY MOUTH DAILY. 90 tablet 0  . Coenzyme Q10 (CO Q-10) 100 MG CAPS Take by mouth.    . dutasteride (AVODART) 0.5 MG capsule Take 0.5 mg by mouth daily.     Marland Kitchen esomeprazole (NEXIUM) 40 MG capsule TAKE 1 CAPSULE TWICE DAILY 180 capsule 1  . loratadine (CLARITIN) 10 MG tablet Take 1 tablet by mouth daily as needed.  3  . losartan (COZAAR) 50 MG tablet TAKE 1 TABLET (50 MG TOTAL) BY MOUTH DAILY. 90 tablet 0  . lovastatin (MEVACOR) 40 MG tablet TAKE 1 TABLET BY MOUTH AT BEDTIME 90 tablet 0  . Multiple Vitamins-Minerals (MULTIVITAMINS THER.  W/MINERALS) TABS Take 1 tablet by mouth daily.      . nitroGLYCERIN (NITROLINGUAL) 0.4 MG/SPRAY spray Place 1 spray under the tongue every 5 (five) minutes x 3 doses as needed for chest pain. 0.4 g 3  . RANEXA 1000 MG SR tablet TAKE 1 TABLET BY MOUTH TWICE DAILY 180 tablet 0  . torsemide (DEMADEX) 20 MG tablet Take 1 tablet (20 mg total) by mouth as needed. 90 tablet 1  . vitamin E 200 UNIT capsule Take 200 Units by mouth daily.       No current facility-administered medications for this visit.    Social History   Social History  . Marital Status: Married    Spouse Name: N/A  . Number of Children: N/A  . Years of Education: N/A   Occupational History  . Not on file.   Social History Main Topics  . Smoking status: Former Smoker -- 1.00 packs/day for 38 years    Types: Cigarettes    Quit date: 02/09/2005  . Smokeless tobacco: Never Used     Comment: "quit smoking cigarrettes 2006"  . Alcohol Use: No  . Drug Use: No  . Sexual Activity: Not on file   Other Topics Concern  . Not on file   Social History Narrative   Socially he is married has 2 children 2 stepchildren. He quit tobacco in 2000.  Family History  Problem Relation Age of Onset  . Colon cancer Neg Hx   . Breast cancer Paternal Aunt   . Diabetes Sister   . Ulcers Father   . Heart disease Father   . Heart failure Father   . Heart attack Father   . AAA (abdominal aortic aneurysm) Father   . Stroke Mother   . Hypertension Mother    ROS General: Negative; No fevers, chills, or night sweats;  HEENT: History of a detached retina in his right eye; No changes in  hearing, sinus congestion, difficulty swallowing Pulmonary: Negative; No cough, wheezing, shortness of breath, hemoptysis Cardiovascular: see HPI;  GI: s/p polypectomy and rare red blood per rectum followed by Dr. Henrene [name]. GU: Occasional concentrated or  dark urine without blood Musculoskeletal: Negative; no myalgias, joint pain, or  weakness Hematologic/Oncology: Negative; no easy  bruising, bleeding Endocrine: Negative; no heat/cold intolerance; no diabetes Neuro: Negative; no changes in balance, headaches Skin: Negative; No rashes or skin lesions Psychiatric: Negative; No behavioral problems, depression Sleep: Negative; No snoring, daytime sleepiness, hypersomnolence, bruxism, restless legs, hypnogognic hallucinations, no cataplexy Other comprehensive 14 point system review is negative.   PE BP 110/60 mmHg  Pulse 61  Ht 5' 5"  (1.651 m)  Wt 143 lb (64.864 kg)  BMI 23.80 kg/m2   Repeat BP 122/70  Wt Readings from Last 3 Encounters:  04/14/15 143 lb (64.864 kg)  02/19/15 143 lb 3.2 oz (64.955 kg)  12/17/14 142 lb (64.411 kg)   General: Alert, oriented, no distress.  Skin: normal turgor, no rashes HEENT: Normocephalic, atraumatic. Pupils round and reactive; sclera anicteric;no lid lag.  Nose without nasal septal hypertrophy Mouth/Parynx benign; Mallinpatti scale 3 Neck: No JVD, bilateral carotid bruits, right greater than left  Lungs: clear to ausculatation and percussion; no wheezing or rales Chest wall: no tenderness to palpitation Heart: RRR, s1 s2 normal with a 2/6 systolic murmur along the left sternal border. No S3 or S4 gallop. No diastolic murmurs.  No S3 or S4 gallop Abdomen: soft, nontender; no hepatosplenomehaly, BS+; abdominal aorta nontender and not dilated by palpation. Back: no CVA tenderness Pulses 2+ Extremities:  Trace ankle edema bilaterally;no clubbing cyanosis, Homan's sign negative  Neurologic: grossly nonfocal Psychologic: normal affect and mood.  ECG (independently read by me): normal sinus rhythm at 61 bpm. Probable left atrial enlargement. Right bundle branch block.  ECG (independently read by me): Sinus bradycardia 58 bpm.  Increased QTc interval 473 ms.  Right bundle-branch block.  May 2016 ECG (independently read by me): Sinus bradycardia 59 bpm with right bundle branch  block.I ST-T changes.  QTc interval 475 ms.  PR interval 182 ms.  No ectopy.  February 2016 ECG (independently read by me) normal sinus rhythm at 63 bpm.  Right bundle-branch block.  Probable left posterior fascicular block.  PR interval 170 ms.  QTC interval 480 ms.  November 2015 ECG (independently read by me): Normal sinus rhythm at 62 bpm.  Right bundle branch block.  Small nondiagnostic inferior Q waves.  Prior May 2015ECG : sinus rhythm at 58 beats per minute.  Right bundle branch block.  Prior December 2014 ECG: Sinus rhythm with sinus bradycardia 55 beats per minute. Right bundle branch block.  LABS: BMP Latest Ref Rng 11/25/2014 08/01/2014 01/25/2011  Glucose 65 - 99 mg/dL 89 96 97  BUN 7 - 25 mg/dL 26(H) 26(H) 16  Creatinine 0.70 - 1.18 mg/dL 1.06 1.21 1.25  Sodium 135 - 146 mmol/L 138 140 137  Potassium 3.5 - 5.3 mmol/L 4.8 4.6 4.4  Chloride 98 - 110 mmol/L 106 108 105  CO2 20 - 31 mmol/L 26 25 26   Calcium 8.6 - 10.3 mg/dL 8.7 9.2 8.4   Hepatic Function Latest Ref Rng 11/25/2014 08/01/2014 08/06/2009  Total Protein 6.1 - 8.1 g/dL 6.0(L) 6.2 6.7  Albumin 3.6 - 5.1 g/dL 3.9 3.9 4.1  AST 10 - 35 U/L 15 16 19   ALT 9 - 46 U/L 10 12 17   Alk Phosphatase 40 - 115 U/L 75 69 81  Total Bilirubin 0.2 - 1.2 mg/dL 0.3 0.4 0.6   CBC Latest Ref Rng 11/25/2014 01/25/2011 01/25/2011  WBC 4.0 - 10.5 K/uL 7.1 7.4 -  Hemoglobin 13.0 - 17.0 g/dL 13.1 9.8(L) 10.1(L)  Hematocrit 39.0 - 52.0 % 38.3(L) 28.0(L) 28.5(L)  Platelets 150 - 400 K/uL 188 120(L) -  Lab Results  Component Value Date   MCV 97.0 11/25/2014   MCV 94.0 01/25/2011   MCV 93.2 01/25/2011   Lab Results  Component Value Date   TSH 0.636 08/01/2014    Lipid Panel     Component Value Date/Time   CHOL 172 08/01/2014 0852   TRIG 90 08/01/2014 0852   HDL 66 08/01/2014 0852   CHOLHDL 2.6 08/01/2014 0852   VLDL 18 08/01/2014 0852   LDLCALC 88 08/01/2014 0852     RADIOLOGY: No results [name].    ASSESSMENT AND  PLAN: Mr. Travaughn Vue is a 70 year old gentleman who has an extensive artery and peripheral vascular disease history, and is 33 years status post his initial CABG revascularization surgery done in [place] and 21 years status post his second CABG surgery. He has had multiple peripheral vascular procedures including carotid endarterectomy, renal stenting, and is documented SFA occlusion. His last catheterization intervention was done in 2012 which demonstrated diffuse disease in the vein graft supplying the marginal vessel and 2 additional Taxus stents inserted commencing ostially and extending to the proximal portion of the recently placed mid prior stent. He has a known occluded graft to the RCA with a mid RCA occlusion with left to right collaterals. He also has subclavian steal. He does have mild hypokinesis inferolaterally in the basal inferior wall with an EF of 45% at that catheterization. He does have chronic right bundle branch block. He has not been demonstrated to have an abdominal aortic aneurysm. When I had seen him previously he had complained of mild chest anginal symptomatology typically when he starts exercise and particularly after eating. He had potential areas of myocardium which may be responsible for ischemia.  At that time, I added  Ranexa 500 mg twice a day to his medical regimen and subsequently this was further titrated to 1000 mg twice a day , which has resolved his symptomatology.  In January 2016 he was documented to have paroxysmal atrial fibrillation which responded to increased beta blocker therapy and lasted for ~18 hours.  Because of GI issues, he is not on full dose anticoagulation. He continues to be on aspirin and Plavix.  He is not having significant angina on Ranexa 1000 mg twice a day, losartan 50 mg, amlodipine 2.5 mg,and atenolol 51m in the morning and 50 mg in the evening.  Remotely, he has been on lovastatin for many years and actually did better with this than any of  the newer agents and for this reason was maintained on this per Dr. WRollene [name] I discussed with him that this is a week her statin but apparently he had issues with atorvastatin ands Crestor. I am recommended he follow-up laboratory be done to reassess the aggressiveness of therapy. If LDL is still not at target.  The addition of Zetia may be beneficial.  As always, he remains stable I will see him in 6 months for reevaluation.  Time spent: 25 minutes   TTroy Sine MD, [name] 04/16/2015 1:01 PM

## 2015-04-24 NOTE — Addendum Note (Signed)
Addended by: Janett Labella A on: 04/24/2015 11:47 AM   Modules accepted: Orders

## 2015-05-13 ENCOUNTER — Other Ambulatory Visit: Payer: Self-pay | Admitting: Cardiovascular Disease

## 2015-05-13 NOTE — Telephone Encounter (Signed)
REFILL 

## 2015-05-22 ENCOUNTER — Other Ambulatory Visit: Payer: Self-pay | Admitting: Cardiovascular Disease

## 2015-06-16 ENCOUNTER — Other Ambulatory Visit: Payer: Self-pay | Admitting: Cardiovascular Disease

## 2015-06-16 NOTE — Telephone Encounter (Signed)
Rx(s) sent to pharmacy electronically.  

## 2015-06-18 ENCOUNTER — Other Ambulatory Visit: Payer: Self-pay | Admitting: Internal Medicine

## 2015-07-06 ENCOUNTER — Other Ambulatory Visit: Payer: Self-pay | Admitting: Cardiovascular Disease

## 2015-07-07 NOTE — Telephone Encounter (Signed)
REFILL 

## 2015-08-20 ENCOUNTER — Other Ambulatory Visit: Payer: Self-pay | Admitting: Cardiovascular Disease

## 2015-08-20 NOTE — Telephone Encounter (Signed)
Rx(s) sent to pharmacy electronically.  

## 2015-08-25 ENCOUNTER — Ambulatory Visit (INDEPENDENT_AMBULATORY_CARE_PROVIDER_SITE_OTHER): Payer: Medicare Other | Admitting: Cardiovascular Disease

## 2015-08-25 ENCOUNTER — Encounter: Payer: Self-pay | Admitting: Cardiovascular Disease

## 2015-08-25 VITALS — BP 132/64 | HR 56 | Ht 65.0 in | Wt 143.6 lb

## 2015-08-25 DIAGNOSIS — I739 Peripheral vascular disease, unspecified: Secondary | ICD-10-CM | POA: Diagnosis not present

## 2015-08-25 MED ORDER — NITROGLYCERIN 0.4 MG/SPRAY TL SOLN: 1.0000 | Status: DC | PRN

## 2015-08-25 NOTE — Patient Instructions (Addendum)
Medication Instructions:  Your physician recommends that you continue on your current medications as directed. Please refer to the Current Medication list given to you today.   Labwork: none  Testing/Procedures: none  Follow-Up: Your physician recommends that you schedule a follow-up appointment ON AN AS NEEDED BASIS.   Your physician wants you to follow-up in September [place]. You will receive a reminder letter in the mail two months in advance. If you don't receive a letter, please call our office to schedule the follow-up appointment.    Any Other Special Instructions Will Be Listed Below (If Applicable).     If you need a refill on your cardiac medications before your next appointment, please call your pharmacy.

## 2015-08-25 NOTE — Progress Notes (Signed)
08/25/2015 [name]   1945/11/24  KU:[id number]  Primary Physician [name] Brothers, MD Primary Cardiologist: [name] Harp MD [name]  HPI:  [name] is a very pleasant 70 -year-old thin-appearing Caucasian male patient of [name], formally a patient [name]. I last saw him in the office 02/19/15. He has a long extensive vascular history back to 1994 when he had coronary bypass grafting at [facility]. He had redo bypass grafting at [facility] January 1996 with simultaneous right carotid endarterectomy performed by [name]. His other problems include hypertension, hyperlipidemia, peripheral vascular disease with occluded SFAs although with cardiac rehabilitation he no longer has claudication. He had right renal artery stenting by myself and [name] approximately 15 years ago with repeat intervention several years thereafter because of what sounds like re-in-stent restenosis. He's had annual renal Doppler studies which have shown progressive left renal artery stenosis with slight decrease in the left kidney dimensions. His blood pressures have been well-controlled and his renal function is normal.   Current Outpatient Prescriptions  Medication Sig Dispense Refill  . amLODipine (NORVASC) 2.5 MG tablet TAKE 1 TABLET EVERY DAY 90 tablet 0  . aspirin 81 MG tablet Take 81 mg by mouth daily.    Marland Kitchen atenolol (TENORMIN) 50 MG tablet Take 1 .5 tablets in the morning and 1 tablet at night 75 tablet 6  . atenolol (TENORMIN) 50 MG tablet TAKE 1 AND 1/2 TABLETS EVERY DAY IN THE MORNING AND TAKE 1 TABLET IN THE EVENING 225 tablet 1  . butalbital-aspirin-caffeine (FIORINAL) 50-325-40 MG per capsule Take 1 capsule by mouth every 4 (four) hours as needed. FOR HEADACHE.     Marland Kitchen clopidogrel (PLAVIX) 75 MG tablet Take 1 tablet (75 mg total) by mouth daily. 90 tablet 2  . Coenzyme Q10 (CO Q-10) 100 MG CAPS Take by mouth.    . dutasteride (AVODART) 0.5 MG capsule Take 0.5  mg by mouth daily.     Marland Kitchen esomeprazole (NEXIUM) 40 MG capsule TAKE 1 CAPSULE TWICE DAILY 180 capsule 1  . loratadine (CLARITIN) 10 MG tablet Take 1 tablet by mouth daily as needed.  3  . losartan (COZAAR) 50 MG tablet TAKE 1 TABLET (50 MG TOTAL) BY MOUTH DAILY. 90 tablet 2  . lovastatin (MEVACOR) 40 MG tablet TAKE 1 TABLET BY MOUTH AT BEDTIME 90 tablet 1  . Multiple Vitamins-Minerals (MULTIVITAMINS THER. W/MINERALS) TABS Take 1 tablet by mouth daily.      . nitroGLYCERIN (NITROLINGUAL) 0.4 MG/SPRAY spray Place 1 spray under the tongue every 5 (five) minutes x 3 doses as needed for chest pain. 0.4 g 3  . RANEXA 1000 MG SR tablet TAKE 1 TABLET BY MOUTH TWICE DAILY 180 tablet 2  . torsemide (DEMADEX) 20 MG tablet Take 1 tablet (20 mg total) by mouth as needed. 90 tablet 1  . vitamin E 200 UNIT capsule Take 200 Units by mouth daily.       No current facility-administered medications for this visit.    Allergies  Allergen Reactions  . Codeine Nausea And Vomiting  . Sulfonamide Derivatives     REACTION: rash    Social History   Social History  . Marital Status: Married    Spouse Name: N/A  . Number of Children: N/A  . Years of Education: N/A   Occupational History  . Not on file.   Social History Main Topics  . Smoking status: Former Smoker -- 1.00 packs/day  for 38 years    Types: Cigarettes    Quit date: 02/09/2005  . Smokeless tobacco: Never Used     Comment: "quit smoking cigarrettes 2006"  . Alcohol Use: No  . Drug Use: No  . Sexual Activity: Not on file   Other Topics Concern  . Not on file   Social History Narrative     Review of Systems: General: negative for chills, fever, night sweats or weight changes.  Cardiovascular: negative for chest pain, dyspnea on exertion, edema, orthopnea, palpitations, paroxysmal nocturnal dyspnea or shortness of breath Dermatological: negative for rash Respiratory: negative for cough or wheezing Urologic: negative for  hematuria Abdominal: negative for nausea, vomiting, diarrhea, bright red blood per rectum, melena, or hematemesis Neurologic: negative for visual changes, syncope, or dizziness All other systems reviewed and are otherwise negative except as noted above.    Blood pressure 132/64, pulse 56, height 5\' 5"  (1.651 m), weight 143 lb 9.6 oz (65.137 kg), SpO2 99 %.  General appearance: alert and no distress Neck: no adenopathy, no JVD, supple, symmetrical, trachea midline, thyroid not enlarged, symmetric, no tenderness/mass/nodules and loud bilateral carotid bruits Lungs: clear to auscultation bilaterally Heart: regular rate and rhythm, S1, S2 normal, no murmur, click, rub or gallop Extremities: extremities normal, atraumatic, no cyanosis or edema  EKG not performed today  ASSESSMENT AND PLAN:   PVD (peripheral vascular disease) history of peripheral arterial disease with known occluded SFAs bilaterally not limited by claudication. I have performed right renal artery PTA and stenting 15 or 16 years ago which we have been following by duplex ultrasound on annual basis.his most recent Dopplers performed 02/12/15 revealed a right renal aortic ratio 5.95 and a left 5.7. This has remained fairly stable compared to the prior year with some mild progression of disease on the right and mild decrease in left renal dimension. We'll continue to follow this noninvasively.      [name] Harp MD FACP,FACC,FAHA, [facility] 08/25/2015 11:18 AM

## 2015-08-25 NOTE — Assessment & Plan Note (Signed)
history of peripheral arterial disease with known occluded SFAs bilaterally not limited by claudication. I have performed right renal artery PTA and stenting 15 or 16 years ago which we have been following by duplex ultrasound on annual basis.his most recent Dopplers performed 02/12/15 revealed a right renal aortic ratio 5.95 and a left 5.7. This has remained fairly stable compared to the prior year with some mild progression of disease on the right and mild decrease in left renal dimension. We'll continue to follow this noninvasively.

## 2015-09-22 ENCOUNTER — Other Ambulatory Visit: Payer: Self-pay | Admitting: Internal Medicine

## 2015-09-22 ENCOUNTER — Other Ambulatory Visit: Payer: Self-pay | Admitting: Cardiovascular Disease

## 2015-09-23 NOTE — Telephone Encounter (Signed)
Rx(s) sent to pharmacy electronically.  

## 2015-10-14 ENCOUNTER — Encounter: Payer: Self-pay | Admitting: Cardiology

## 2015-10-14 ENCOUNTER — Ambulatory Visit (INDEPENDENT_AMBULATORY_CARE_PROVIDER_SITE_OTHER): Payer: Medicare Other | Admitting: Cardiology

## 2015-10-14 VITALS — BP 120/60 | HR 94 | Ht 65.0 in | Wt 140.8 lb

## 2015-10-14 DIAGNOSIS — I480 Paroxysmal atrial fibrillation: Secondary | ICD-10-CM | POA: Diagnosis not present

## 2015-10-14 DIAGNOSIS — I739 Peripheral vascular disease, unspecified: Secondary | ICD-10-CM | POA: Diagnosis not present

## 2015-10-14 DIAGNOSIS — I10 Essential (primary) hypertension: Secondary | ICD-10-CM

## 2015-10-14 DIAGNOSIS — Z951 Presence of aortocoronary bypass graft: Secondary | ICD-10-CM

## 2015-10-14 DIAGNOSIS — I452 Bifascicular block: Secondary | ICD-10-CM

## 2015-10-14 DIAGNOSIS — I779 Disorder of arteries and arterioles, unspecified: Secondary | ICD-10-CM

## 2015-10-14 LAB — TSH: TSH: 0.83 mIU/L (ref 0.40–4.50)

## 2015-10-14 MED ORDER — AMIODARONE HCL 200 MG PO TABS: 200.0000 mg | ORAL_TABLET | Freq: Two times a day (BID) | ORAL | 3 refills | Status: DC

## 2015-10-14 MED ORDER — ATENOLOL 50 MG PO TABS: 25.0000 mg | ORAL_TABLET | Freq: Two times a day (BID) | ORAL | 0 refills | Status: DC

## 2015-10-14 NOTE — Patient Instructions (Signed)
Medication Instructions:  1.DECREASE Atenolol to Half tablet twice a day 2. START TOMORROW Amiorodone 200mg ; Take 1 tab by mouth twice a day for 7 days;  3. then DECREASE to Amiorodone 200mg  once a day  Labwork: Your physician recommends that you return for lab work today: CMET and TSH  Testing/Procedures: None Ordered  Follow-Up: Your physician recommends that you schedule a follow-up appointment in: 2 [employer] [facility]   Any Other Special Instructions Will Be Listed Below (If Applicable).     If you need a refill on your cardiac medications before your next appointment, please call your pharmacy.

## 2015-10-14 NOTE — Assessment & Plan Note (Signed)
Controlled.  

## 2015-10-14 NOTE — Progress Notes (Signed)
10/14/2015 [name]   19-Dec-1945  [id number]  Primary Physician Garwin Brothers, MD Primary Cardiologist: Dr Claiborne Billings, Dr Gwenlyn Found (PV)  HPI:  70 y/o male followed by Dr Rollene Fare in the past, now Dr Claiborne Billings, with a history of CAD and PVD. The pt had CABG at [facility] in '84, redo at [facility] in '96 with combined RCEA (DrEarly), LM PCI in '98, and PCI in '05 ans 2012. Myoview in 2015 was low risk. Echo in Jan 2016 (when in NSR) showed his EF to be 55% with mild LAE. The pt had brief PAF documented in jan 2016 which seemed to resolve with increased Atenolol. He declined anticoagulation as he had had some GI bleeding issues (Dr Henrene Pastor follows). He sees Dr Gwenlyn Found for bilateral SFA disease without claudication and renal artery disease, s/p remote Rt RA stenting in the past and known Lt RAS. His SCr and B/P have been stable and Dr Gwenlyn Found is following his Lt RAS. The pt is in the office today after he was noted to be in AF at cardiac rehab a week ago. The pt tells me if his HR gets over 110 he starts to have chest pain. In the offcie today he is in AF with VR 94.   Current Outpatient Prescriptions  Medication Sig Dispense Refill  . aspirin 81 MG tablet Take 81 mg by mouth daily.    Marland Kitchen atenolol (TENORMIN) 50 MG tablet Take 0.5 tablets (25 mg total) by mouth 2 (two) times daily. 225 tablet 0  . butalbital-aspirin-caffeine (FIORINAL) 50-325-40 MG per capsule Take 1 capsule by mouth every 4 (four) hours as needed. FOR HEADACHE.     Marland Kitchen clopidogrel (PLAVIX) 75 MG tablet Take 1 tablet (75 mg total) by mouth daily. 90 tablet 2  . Coenzyme Q10 (CO Q-10) 100 MG CAPS Take 1 tablet by mouth daily.     Marland Kitchen dutasteride (AVODART) 0.5 MG capsule Take 0.5 mg by mouth daily.     Marland Kitchen esomeprazole (NEXIUM) 40 MG capsule TAKE 1 CAPSULE TWICE DAILY 180 capsule 1  . loratadine (CLARITIN) 10 MG tablet Take 1 tablet by mouth daily as needed for allergies, rhinitis or itching.   3  . losartan (COZAAR) 50 MG tablet TAKE 1 TABLET (50 MG TOTAL) BY MOUTH  DAILY. 90 tablet 2  . lovastatin (MEVACOR) 40 MG tablet TAKE 1 TABLET BY MOUTH AT BEDTIME 90 tablet 1  . Multiple Vitamins-Minerals (MULTIVITAMINS THER. W/MINERALS) TABS Take 1 tablet by mouth daily.      . nitroGLYCERIN (NITROLINGUAL) 0.4 MG/SPRAY spray Place 1 spray under the tongue every 5 (five) minutes x 3 doses as needed for chest pain. 0.4 g 3  . RANEXA 1000 MG SR tablet TAKE 1 TABLET BY MOUTH TWICE DAILY 180 tablet 2  . torsemide (DEMADEX) 20 MG tablet Take 20 mg by mouth as needed (EDEMA).    . vitamin E 200 UNIT capsule Take 200 Units by mouth daily.      Marland Kitchen amiodarone (PACERONE) 200 MG tablet Take 1 tablet (200 mg total) by mouth 2 (two) times daily. Take 1 tab by mouth twice a day for 7 days; then take 1 tab daily 30 tablet 3   No current facility-administered medications for this visit.     Allergies  Allergen Reactions  . Codeine Nausea And Vomiting  . Sulfonamide Derivatives Other (See Comments)    REACTION: rash    Social History   Social History  . Marital status: Married  Spouse name: N/A  . Number of children: N/A  . Years of education: N/A   Occupational History  . Not on file.   Social History Main Topics  . Smoking status: Former Smoker    Packs/day: 1.00    Years: 38.00    Types: Cigarettes    Quit date: 02/09/2005  . Smokeless tobacco: Never Used     Comment: "quit smoking cigarrettes 2006"  . Alcohol use No  . Drug use: No  . Sexual activity: Not on file   Other Topics Concern  . Not on file   Social History Narrative  . No narrative on file     Review of Systems: General: negative for chills, fever, night sweats or weight changes.  Cardiovascular: negative for chest pain, dyspnea on exertion, edema, orthopnea, palpitations, paroxysmal nocturnal dyspnea or shortness of breath Dermatological: negative for rash Respiratory: negative for cough or wheezing Urologic: negative for hematuria Abdominal: negative for nausea, vomiting, diarrhea,  bright red blood per rectum, melena, or hematemesis Neurologic: negative for visual changes, syncope, or dizziness All other systems reviewed and are otherwise negative except as noted above.    Blood pressure 120/60, pulse 94, height 5\' 5"  (1.651 m), weight 140 lb 12.8 oz (63.9 kg).  General appearance: alert, cooperative and no distress Neck: no JVD and bilateral CA bruits with RCE scar Lungs: clear to auscultation bilaterally Heart: irregularly irregular rhythm Extremities: extremities normal, atraumatic, no cyanosis or edema Skin: Skin color, texture, turgor normal. No rashes or lesions Neurologic: Grossly normal  EKG AF with VR 94, RBBB, LAFB  ASSESSMENT AND PLAN:   PAF (paroxysmal atrial fibrillation) (HCC) Recurrent PAF with chest pain at higher rates.  Hx of CABG '84, '96, last PCI 2012 Hx. CABG 1982 at [facility] redo CABG 1996. LM stent 1998, LAD, RCA Stent 2005, last PCI 2012  Carotid disease, bilateral (HCC) S/P RCEA, known LCA bruit, Dr Donnetta Hutching follows  PVD (peripheral vascular disease) ([place]) Know bilat LE PVD, prior Rt Renal Stent, known Lt RA stenosis- Dr Gwenlyn Found following  Essential hypertension Controlled   PLAN  Discussed with DOD-Dr Percival Spanish. Add Amiodarone 200 mg BID for a week, then decrease to 200 mg daily. I also cut his Atenolol back to 25 mg BID and ordered CMET and TSH. He should f/u with Dr Evette Georges APP in two weeks. I discussed anticoagulation and the pt is not interested at this time., his CHADS VASC=3.   Kerin Ransom PA-C 10/14/2015 10:57 AM

## 2015-10-14 NOTE — Assessment & Plan Note (Signed)
[facility]x. [facility]. LM stent 1998, LAD, RCA Stent 2005, last PCI 2012 

## 2015-10-14 NOTE — Assessment & Plan Note (Signed)
Recurrent PAF with chest pain at higher rates.

## 2015-10-14 NOTE — Assessment & Plan Note (Signed)
S/P RCEA, known LCA bruit, Dr Donnetta Hutching follows

## 2015-10-14 NOTE — Assessment & Plan Note (Signed)
Know bilat LE PVD, prior Rt Renal Stent, known Lt RA stenosis- Dr Gwenlyn Found following

## 2015-10-15 LAB — COMPREHENSIVE METABOLIC PANEL
ALT: 11 U/L (ref 9–46)
AST: 17 U/L (ref 10–35)
Albumin: 3.7 g/dL (ref 3.6–5.1)
Alkaline Phosphatase: 64 U/L (ref 40–115)
BUN: 26 mg/dL — ABNORMAL HIGH (ref 7–25)
CO2: 23 mmol/L (ref 20–31)
Calcium: 8.7 mg/dL (ref 8.6–10.3)
Chloride: 110 mmol/L (ref 98–110)
Creat: 1.12 mg/dL (ref 0.70–1.18)
Glucose, Bld: 96 mg/dL (ref 65–99)
Potassium: 5.1 mmol/L (ref 3.5–5.3)
Sodium: 140 mmol/L (ref 135–146)
Total Bilirubin: 0.4 mg/dL (ref 0.2–1.2)
Total Protein: 5.8 g/dL — ABNORMAL LOW (ref 6.1–8.1)

## 2015-10-23 ENCOUNTER — Ambulatory Visit (HOSPITAL_COMMUNITY)
Admission: RE | Admit: 2015-10-23 | Discharge: 2015-10-23 | Disposition: A | Discharge status: Home / Self Care | Payer: Medicare Other | Source: Ambulatory Visit | Attending: Cardiovascular Disease | Admitting: Cardiovascular Disease

## 2015-10-23 DIAGNOSIS — I2510 Atherosclerotic heart disease of native coronary artery without angina pectoris: Secondary | ICD-10-CM | POA: Insufficient documentation

## 2015-10-23 DIAGNOSIS — I701 Atherosclerosis of renal artery: Secondary | ICD-10-CM | POA: Diagnosis not present

## 2015-10-23 DIAGNOSIS — E785 Hyperlipidemia, unspecified: Secondary | ICD-10-CM | POA: Insufficient documentation

## 2015-10-23 DIAGNOSIS — K219 Gastro-esophageal reflux disease without esophagitis: Secondary | ICD-10-CM | POA: Diagnosis not present

## 2015-10-29 ENCOUNTER — Telehealth: Payer: Self-pay | Admitting: *Deleted

## 2015-10-29 ENCOUNTER — Encounter: Payer: Self-pay | Admitting: Cardiology

## 2015-10-29 ENCOUNTER — Ambulatory Visit (INDEPENDENT_AMBULATORY_CARE_PROVIDER_SITE_OTHER): Payer: Medicare Other | Admitting: Cardiology

## 2015-10-29 VITALS — BP 132/60 | HR 64 | Ht 65.0 in | Wt 141.8 lb

## 2015-10-29 DIAGNOSIS — I480 Paroxysmal atrial fibrillation: Secondary | ICD-10-CM

## 2015-10-29 DIAGNOSIS — I739 Peripheral vascular disease, unspecified: Secondary | ICD-10-CM

## 2015-10-29 NOTE — Progress Notes (Signed)
10/29/2015 [name]   [birth year]  [id number]  Primary Physician Garwin Brothers, MD Primary Cardiologist: Dr. Claiborne Billings Dr. Gwenlyn Found   Reason for Visit/CC: Paroxysmal Atrial Fibrillation   HPI:  70 y/o male followed by Dr Rollene Fare in the past, now Dr Claiborne Billings, with a history of CAD and PVD. The pt had CABG at [facility] in '84, redo at [facility] in '96 with combined RCEA (DrEarly), LM PCI in '98, and PCI in '05 ans 2012. Myoview in 2015 was low risk. Echo in Jan 2016 (when in NSR) showed his EF to be 55% with mild LAE. The pt had brief PAF documented in jan 2016 which seemed to resolve with increased Atenolol. He declined anticoagulation as he had had some GI bleeding issues (Dr Henrene Pastor follows). He sees Dr Gwenlyn Found for bilateral SFA disease without claudication and renal artery disease, s/p remote Rt RA stenting in the past and known Lt RAS. His SCr and B/P have been stable and Dr Gwenlyn Found is following his Lt RAS.   He was seen by Kerin Ransom, PA-C, in clinic on 10/14/2015 after he was referred by cardiac rehabilitation due to recurrence of atrial fibrillation. His EKG on August 8 demonstrated atrial fibrillation with ventricular response of 94 bpm.  Decision was made to add amiodarone 200 mg twice a day for one week then decrease to 200 mg daily. His atenolol was reduced to 25 mg twice a day. TSH and CMP were obtained. TSH was normal. K was stable at 5.1. Creatinine was 1.12.  He reports that he has done well. He is tolerating amiodarone without any side effects. No dyspnea. No nausea/vomiting. He feels that he went back into a normal heart rhythm several days ago. He denies any palpitations currently. No chest discomfort. No dyspnea. We discussed his stroke risk however he continues to refuse anticoagulation due to history of GI bleeding in the past.    Current Outpatient Prescriptions  Medication Sig Dispense Refill  . amiodarone (PACERONE) 200 MG tablet Take 200 mg by mouth daily.    Marland Kitchen aspirin 81 MG tablet Take 81 mg  by mouth daily.    Marland Kitchen atenolol (TENORMIN) 50 MG tablet Take 0.5 tablets (25 mg total) by mouth 2 (two) times daily. 225 tablet 0  . butalbital-aspirin-caffeine (FIORINAL) 50-325-40 MG per capsule Take 1 capsule by mouth every 4 (four) hours as needed. FOR HEADACHE.     Marland Kitchen clopidogrel (PLAVIX) 75 MG tablet Take 1 tablet (75 mg total) by mouth daily. 90 tablet 2  . Coenzyme Q10 (CO Q-10) 100 MG CAPS Take 1 tablet by mouth daily.     Marland Kitchen dutasteride (AVODART) 0.5 MG capsule Take 0.5 mg by mouth daily.     Marland Kitchen esomeprazole (NEXIUM) 40 MG capsule TAKE 1 CAPSULE TWICE DAILY 180 capsule 1  . loratadine (CLARITIN) 10 MG tablet Take 1 tablet by mouth daily as needed for allergies, rhinitis or itching.   3  . losartan (COZAAR) 50 MG tablet TAKE 1 TABLET (50 MG TOTAL) BY MOUTH DAILY. 90 tablet 2  . lovastatin (MEVACOR) 40 MG tablet TAKE 1 TABLET BY MOUTH AT BEDTIME 90 tablet 1  . Multiple Vitamins-Minerals (MULTIVITAMINS THER. W/MINERALS) TABS Take 1 tablet by mouth daily.      . nitroGLYCERIN (NITROLINGUAL) 0.4 MG/SPRAY spray Place 1 spray under the tongue every 5 (five) minutes x 3 doses as needed for chest pain. 0.4 g 3  . RANEXA 1000 MG SR tablet TAKE 1 TABLET BY MOUTH TWICE DAILY 180  tablet 2  . torsemide (DEMADEX) 20 MG tablet Take 20 mg by mouth as needed (EDEMA).    . vitamin E 200 UNIT capsule Take 200 Units by mouth daily.       No current facility-administered medications for this visit.     Allergies  Allergen Reactions  . Codeine Nausea And Vomiting  . Sulfonamide Derivatives Other (See Comments)    REACTION: rash    Social History   Social History  . Marital status: Married    Spouse name: N/A  . Number of children: N/A  . Years of education: N/A   Occupational History  . Not on file.   Social History Main Topics  . Smoking status: Former Smoker    Packs/day: 1.00    Years: 38.00    Types: Cigarettes    Quit date: 02/09/2005  . Smokeless tobacco: Never Used     Comment: "quit  smoking cigarrettes 2006"  . Alcohol use No  . Drug use: No  . Sexual activity: Not on file   Other Topics Concern  . Not on file   Social History Narrative  . No narrative on file     Review of Systems: General: negative for chills, fever, night sweats or weight changes.  Cardiovascular: negative for chest pain, dyspnea on exertion, edema, orthopnea, palpitations, paroxysmal nocturnal dyspnea or shortness of breath Dermatological: negative for rash Respiratory: negative for cough or wheezing Urologic: negative for hematuria Abdominal: negative for nausea, vomiting, diarrhea, bright red blood per rectum, melena, or hematemesis Neurologic: negative for visual changes, syncope, or dizziness All other systems reviewed and are otherwise negative except as noted above.    Blood pressure 132/60, pulse 64, height 5\' 5"  (1.651 m), weight 141 lb 12.8 oz (64.3 kg), SpO2 98 %.  General appearance: alert, cooperative and no distress Neck: no carotid bruit and no JVD Lungs: clear to auscultation bilaterally Heart: regular rate and rhythm, S1, S2 normal, no murmur, click, rub or gallop Extremities: no LEE Pulses: 2+ and symmetric Skin: warm and dry Neurologic: Grossly normal  EKG NSR with PACs. 58 bpm.   ASSESSMENT AND PLAN:   1. PAF: EKG shows that he is back in sinus rhythm. Heart rate is 58 bpm. Continue amiodarone 200 mg daily. Recommend rechecking TSH and hepatic function test at his next follow-up appointment for medication monitoring. Patient instructed to notify our office if any developments of dyspnea, wheezing, recurrent palpitations or chest pain. His CHA2DS2 VASc score is 3, however he continues to decline anticoagulation as he has had some GI bleeding issues in the past. Continue ASA + Plavix.    2. CAD: s/p  CABG at [facility] in '84, redo at [facility] in '96 with combined RCEA (DrEarly), LM PCI in '98, and PCI in '05 ans 2012. Myoview in 2015 was low risk. Echo in Jan 2016 (when in NSR)  showed his EF to be 55% with mild LAE. Stable w/o anginal symptomatology. Continue aspirin, statin, beta blocker, Ranexa and ARB.   PLAN  F/u in 3 months with Dr. Claiborne Billings or his APP for repeat assessment. Recommend f/u labs, TSH + HFTs for medication monitoring given treatment with amiodarone.   Brittainy Simmons PA-C 10/29/2015 10:14 AM

## 2015-10-29 NOTE — Telephone Encounter (Signed)
-----   Message from [name], MD sent at 10/24/2015 10:38 AM EDT ----- No change from prior study. Repeat in 12 months.

## 2015-10-29 NOTE — Patient Instructions (Signed)
Medication Instructions:  CONTINUE AMIODARONE 200 MG DAILY;  CONTINUE ALL OTHER MEDICATIONS AS PRESCRIBED  Labwork: NONE  Testing/Procedures: NONE  Follow-Up: 3 MONTHS WITH DR. Claiborne Billings OR TEAM MEMBER  Any Other Special Instructions Will Be Listed Below (If Applicable).     If you need a refill on your cardiac medications before your next appointment, please call your pharmacy.

## 2015-10-29 NOTE — Telephone Encounter (Signed)
Repeat order entered into EPIC.

## 2015-12-17 ENCOUNTER — Encounter: Payer: Self-pay | Admitting: Family

## 2015-12-21 ENCOUNTER — Other Ambulatory Visit: Payer: Self-pay | Admitting: *Deleted

## 2015-12-21 DIAGNOSIS — I6523 Occlusion and stenosis of bilateral carotid arteries: Secondary | ICD-10-CM

## 2015-12-21 DIAGNOSIS — Z48812 Encounter for surgical aftercare following surgery on the circulatory system: Secondary | ICD-10-CM

## 2015-12-22 ENCOUNTER — Ambulatory Visit (INDEPENDENT_AMBULATORY_CARE_PROVIDER_SITE_OTHER): Payer: Medicare Other | Admitting: Family

## 2015-12-22 ENCOUNTER — Ambulatory Visit (HOSPITAL_COMMUNITY)
Admission: RE | Admit: 2015-12-22 | Discharge: 2015-12-22 | Disposition: A | Discharge status: Home / Self Care | Payer: Medicare Other | Source: Ambulatory Visit | Attending: Family | Admitting: Family

## 2015-12-22 ENCOUNTER — Encounter: Payer: Self-pay | Admitting: Family

## 2015-12-22 VITALS — BP 86/52 | HR 58 | Temp 97.0°F | Resp 14 | Ht 65.0 in | Wt 144.0 lb

## 2015-12-22 DIAGNOSIS — Z9889 Other specified postprocedural states: Secondary | ICD-10-CM

## 2015-12-22 DIAGNOSIS — Z48812 Encounter for surgical aftercare following surgery on the circulatory system: Secondary | ICD-10-CM | POA: Diagnosis not present

## 2015-12-22 DIAGNOSIS — I771 Stricture of artery: Secondary | ICD-10-CM | POA: Diagnosis not present

## 2015-12-22 DIAGNOSIS — I6523 Occlusion and stenosis of bilateral carotid arteries: Secondary | ICD-10-CM | POA: Insufficient documentation

## 2015-12-22 NOTE — Patient Instructions (Signed)
Stroke Prevention Some medical conditions and behaviors are associated with an increased chance of having a stroke. You may prevent a stroke by making healthy choices and managing medical conditions. HOW CAN I REDUCE MY RISK OF HAVING A STROKE?   Stay physically active. Get at least 30 minutes of activity on most or all days.  Do not smoke. It may also be helpful to avoid exposure to secondhand smoke.  Limit alcohol use. Moderate alcohol use is considered to be:  No more than 2 drinks per day for men.  No more than 1 drink per day for nonpregnant women.  Eat healthy foods. This involves:  Eating 5 or more servings of fruits and vegetables a day.  Making dietary changes that address high blood pressure (hypertension), high cholesterol, diabetes, or obesity.  Manage your cholesterol levels.  Making food choices that are high in fiber and low in saturated fat, trans fat, and cholesterol may control cholesterol levels.  Take any prescribed medicines to control cholesterol as directed by your health care provider.  Manage your diabetes.  Controlling your carbohydrate and sugar intake is recommended to manage diabetes.  Take any prescribed medicines to control diabetes as directed by your health care provider.  Control your hypertension.  Making food choices that are low in salt (sodium), saturated fat, trans fat, and cholesterol is recommended to manage hypertension.  Ask your health care provider if you need treatment to lower your blood pressure. Take any prescribed medicines to control hypertension as directed by your health care provider.  If you are 18-39 years of age, have your blood pressure checked every 3-5 years. If you are 40 years of age or older, have your blood pressure checked every year.  Maintain a healthy weight.  Reducing calorie intake and making food choices that are low in sodium, saturated fat, trans fat, and cholesterol are recommended to manage  weight.  Stop drug abuse.  Avoid taking birth control pills.  Talk to your health care provider about the risks of taking birth control pills if you are over 35 years old, smoke, get migraines, or have ever had a blood clot.  Get evaluated for sleep disorders (sleep apnea).  Talk to your health care provider about getting a sleep evaluation if you snore a lot or have excessive sleepiness.  Take medicines only as directed by your health care provider.  For some people, aspirin or blood thinners (anticoagulants) are helpful in reducing the risk of forming abnormal blood clots that can lead to stroke. If you have the irregular heart rhythm of atrial fibrillation, you should be on a blood thinner unless there is a good reason you cannot take them.  Understand all your medicine instructions.  Make sure that other conditions (such as anemia or atherosclerosis) are addressed. SEEK IMMEDIATE MEDICAL CARE IF:   You have sudden weakness or numbness of the face, arm, or leg, especially on one side of the body.  Your face or eyelid droops to one side.  You have sudden confusion.  You have trouble speaking (aphasia) or understanding.  You have sudden trouble seeing in one or both eyes.  You have sudden trouble walking.  You have dizziness.  You have a loss of balance or coordination.  You have a sudden, severe headache with no known cause.  You have new chest pain or an irregular heartbeat. Any of these symptoms may represent a serious problem that is an emergency. Do not wait to see if the symptoms will   go away. Get medical help at once. Call your local emergency services (911 in [place] Do not drive yourself to the hospital.   This information is not intended to replace advice given to you by your health care provider. Make sure you discuss any questions you have with your health care provider.   Document Released: 03/31/2004 Document Revised: 03/14/2014 Document Reviewed:  08/24/2012 Elsevier Interactive Patient Education 2016 Elsevier Inc.  

## 2015-12-22 NOTE — Progress Notes (Signed)
Chief Complaint: Follow up Extracranial Carotid Artery Stenosis   History of Present Illness  [name] is a 70 y.o. male patient of Dr. Donnetta Hutching who is s/p right carotid endarterectomy by Dr. Donnetta Hutching in 1996. He did well since that time. He returns today for carotid artery surveillance and evaluaion. He does have a known moderate left carotid stenosis and is being followed with serial ultrasounds. He does have history of significant coronary disease with multiple prior stenting procedures and myocardial infarctions. He is extremely active in a walking program and is comfortable with this. He does have known superficial femoral artery occlusive disease, but he no longer has claudication symptoms in his legs since he has been exercising so much.  He denies any history of stroke or TIA, specifically he denies a history of amaurosis fugax or monocular blindness,unilateral facial drooping, hemiplegia, or receptive or expressive aphasia.   He denies any steal symptoms in either upper extremity, denies cold sensation in either upper extremity, denies pain in either extremity. He denies feeling dizzy, but does feel off balance if he turns quickly; states he has tinnitus. He has been evaluated by an ENT in [facility] re this.   He also reports that he has known sacral, lumbar, and thoracic "bulging discs", degenerative disc disease, found on CT of his spine, per patient. He also did years of heavy lifting as an EMT. He also reports that he has scoliosis.  He does cardiac rehab/wellness program at [facility] 3 days/week since 2005, states this has helped his PAD, he no longer has claudication symptoms in his legs with walking.  Pt states that his cardiologist is monitoring his aorta, iliac arteries, renal arteries, and checks ABI's regularly.   Pt denies any recent chest pain, last chest pain episode was in 2012, had a cardiac stent placed then; states in 1996 his LIMA was used as a  bypass vessel. He had 4 vessel CABG in the 1980's and again in 1996.  He was diagnosed paroxysmal atrial fib, takes amiodarone and metoprolol prescribed by Dr. Claiborne Billings; he was not started on an anticoagulant since it was found that he has a small GI bleed.  Pt Diabetic: No Pt smoker: former smoker, quit in 2006  Pt meds include: Statin : Yes ASA: Yes Other anticoagulants/antiplatelets: Plavix   Past Medical History:  Diagnosis Date  . Anemia   . Angina   . Atrial fibrillation ([place])   . Blood transfusion   . Bronchitis   . Bulging discs    "8 of them; thoracic, lumbar, sacral area"  . Chronic back pain   . Colon polyps   . Complication of anesthesia   . Coronary artery disease 01/24/11   Successful PCI long segmental stensois  vein graft to the CX marginal vessel-graft previously stented prox. 3.0x64mm TAXUS stent mid seg 3.0x76mm TAXUS stent now tandem stens of 3.0x56mm & 3.0x51mm placed with the seg. 50,80 & 99% stenosis reduced to 0%  . DDD (degenerative disc disease)   . Dysrhythmia    "PAC's and PVC's"  . Esophageal dilatation    "have had it done 7 times; last time ~ 2001"  . Esophageal stricture   . GERD (gastroesophageal reflux disease)   . Headache(784.0)   . Hiatal hernia   . Hyperlipidemia   . Myocardial infarction 1991  . Myocardial infarction 2005   "had 3 heart attacks this year"  . Paroxysmal atrial fibrillation (HCC)   . Pyloric stenosis   . RBBB   .  Renal artery stenosis (HCC)    hhistory of right renal artery stenting and re-intervention for "in-stent restenosis.  . S/P CABG (coronary artery bypass graft) [place]  . Shortness of breath    "when I had the heart attacks"  . Subclavian artery stenosis, left ([place])   . Ulcer ([place])    "small; Dr. Henrene Pastor found it 02/2010"    Social History Social History  Substance Use Topics  . Smoking status: Former Smoker    Packs/day: 1.00    Years: 38.00    Types:  Cigarettes    Quit date: 02/09/2005  . Smokeless tobacco: Never Used     Comment: "quit smoking cigarrettes 2006"  . Alcohol use No    Family History Family History  Problem Relation Age of Onset  . Breast cancer Paternal Aunt   . Diabetes Sister   . Ulcers Father   . Heart disease Father   . Heart failure Father   . Heart attack Father   . AAA (abdominal aortic aneurysm) Father   . Stroke Mother   . Hypertension Mother   . Colon cancer Neg Hx     Surgical History Past Surgical History:  Procedure Laterality Date  . ADENOIDECTOMY     "when I was an infant"  . APPENDECTOMY  1953  . CAROTID ENDARTERECTOMY Right 1996   CE  . CORONARY ANGIOPLASTY WITH STENT PLACEMENT  01/24/11   "today makes a total of 9 stents"  . CORONARY ARTERY BYPASS GRAFT  1984   CABG X 4  . CORONARY ARTERY BYPASS GRAFT  1996   CABG X 4  . LEFT HEART CATHETERIZATION WITH CORONARY/GRAFT ANGIOGRAM N/A 01/24/2011   Procedure: LEFT HEART CATHETERIZATION WITH Beatrix Fetters;  Surgeon: Troy Sine, MD;  Location[facility] CATH LAB;  Service: Cardiovascular;  Laterality: N/A;  . pylondial cyst removal    . RETINAL DETACHMENT SURGERY  04/2007   right  . TONSILLECTOMY  1972    Allergies  Allergen Reactions  . Codeine Nausea And Vomiting  . Sulfonamide Derivatives Other (See Comments)    REACTION: rash    Current Outpatient Prescriptions  Medication Sig Dispense Refill  . amiodarone (PACERONE) 200 MG tablet Take 200 mg by mouth daily.    Marland Kitchen aspirin 81 MG tablet Take 81 mg by mouth daily.    Marland Kitchen atenolol (TENORMIN) 50 MG tablet Take 0.5 tablets (25 mg total) by mouth 2 (two) times daily. 225 tablet 0  . butalbital-aspirin-caffeine (FIORINAL) 50-325-40 MG per capsule Take 1 capsule by mouth every 4 (four) hours as needed. FOR HEADACHE.     Marland Kitchen clopidogrel (PLAVIX) 75 MG tablet Take 1 tablet (75 mg total) by mouth daily. 90 tablet 2  . Coenzyme Q10 (CO Q-10) 100 MG CAPS Take 1 tablet by mouth daily.      Marland Kitchen dutasteride (AVODART) 0.5 MG capsule Take 0.5 mg by mouth daily.     Marland Kitchen esomeprazole (NEXIUM) 40 MG capsule TAKE 1 CAPSULE TWICE DAILY 180 capsule 1  . loratadine (CLARITIN) 10 MG tablet Take 1 tablet by mouth daily as needed for allergies, rhinitis or itching.   3  . losartan (COZAAR) 50 MG tablet TAKE 1 TABLET (50 MG TOTAL) BY MOUTH DAILY. 90 tablet 2  . lovastatin (MEVACOR) 40 MG tablet TAKE 1 TABLET BY MOUTH AT BEDTIME 90 tablet 1  . Multiple Vitamins-Minerals (MULTIVITAMINS THER. W/MINERALS) TABS Take 1 tablet by mouth daily.      Marland Kitchen  nitroGLYCERIN (NITROLINGUAL) 0.4 MG/SPRAY spray Place 1 spray under the tongue every 5 (five) minutes x 3 doses as needed for chest pain. 0.4 g 3  . RANEXA 1000 MG SR tablet TAKE 1 TABLET BY MOUTH TWICE DAILY 180 tablet 2  . torsemide (DEMADEX) 20 MG tablet Take 20 mg by mouth as needed (EDEMA).    . vitamin E 200 UNIT capsule Take 200 Units by mouth daily.       No current facility-administered medications for this visit.     Review of Systems : See HPI for pertinent positives and negatives.  Physical Examination  Vitals:   12/22/15 1449 12/22/15 1451  BP: (!) 133/55 (!) 86/52  Pulse: 60 (!) 58  Resp:  14  Temp:  97 F (36.1 C)  TempSrc:  Oral  SpO2:  98%  Weight:  144 lb (65.3 kg)  Height:  5\' 5"  (1.651 m)   Body mass index is 23.96 kg/m.  General: WDWN male in NAD GAIT: normal Eyes: PERRLA Pulmonary: Respirations are non-labored, CTAB, good air movement in all fields, no rales,  rhonchi, or wheezing.  Cardiac: regular rhythm and rate, no detected murmur.  VASCULAR EXAM Carotid Bruits Right Left   Positive Positive   Aorta is palpable. Radial pulses are 2+ right and 1+ left palpable.      LE Pulses Right Left   FEMORAL 2+ palpable 1+ palpable    POPLITEAL not palpable   not palpable   POSTERIOR TIBIAL not palpable  not palpable    DORSALIS PEDIS  ANTERIOR TIBIAL not palpable  not palpable     Gastrointestinal: soft, nontender, BS WNL, no r/g,no palpable masses.   Musculoskeletal: No muscle atrophy/wasting. M/S 5/5 throughout, Extremities without ischemic changes. 1+ right and 2+ left ankle pitting edema.  Neurologic: A&O X 3; Appropriate Affect, Speech is normal CN 2-12 intact, Pain and light touch intact in extremities, Motor exam as listed above.     Assessment: [name] MATTHIS is a 70 y.o. male who is s/p right carotid endarterectomy in 1996 and has known left subclavian artery stenosis. He has no remote nor recent history of stroke or TIA. He has no steal symptoms in either upper extremity.  I discussed with Dr. Donnetta Hutching the right CEA site with increased stenosis up to 60-79%, pt remains aymptomatic, see Plan.  Pt states that his cardiologist is monitoring his aorta, iliac arteries, renal arteries, and checks ABI's regularly.   DATA Today's carotid Duplex suggests 60-79% right ICA stenoses (CEA site), and 40-59% left ICA stenosis.  Monophasic left subclavian artery and retrograde left vertebral artery flow; greater than 20 mm Hg difference in the bilateral brachial pressures. Disease progression of the right internal carotid artery when compared to the exam on 12/17/14.    Plan: Follow-up in 6 months with Carotid Duplex scan.   I discussed in depth with the patient the nature of atherosclerosis, and emphasized the importance of maximal medical management including strict control of blood pressure, blood glucose, and lipid levels, obtaining regular exercise, and continued cessation of smoking.  The patient is aware that without maximal medical management the underlying atherosclerotic disease process will progress, limiting the benefit of any interventions. The patient was given information about stroke prevention  and what symptoms should prompt the patient to seek immediate medical care. Thank you for allowing [place] to participate in this patient's care.  Clemon Chambers, RN, MSN, FNP-C [facility] Office: [phone]  Clinic Physician: Early  12/22/15 2:58 PM

## 2015-12-30 ENCOUNTER — Other Ambulatory Visit: Payer: Self-pay | Admitting: Cardiovascular Disease

## 2016-01-08 ENCOUNTER — Other Ambulatory Visit: Payer: Self-pay | Admitting: Cardiovascular Disease

## 2016-01-14 ENCOUNTER — Other Ambulatory Visit: Payer: Self-pay | Admitting: Cardiology

## 2016-01-26 ENCOUNTER — Other Ambulatory Visit: Payer: Self-pay | Admitting: Cardiovascular Disease

## 2016-02-02 ENCOUNTER — Ambulatory Visit (INDEPENDENT_AMBULATORY_CARE_PROVIDER_SITE_OTHER): Payer: Medicare Other | Admitting: Cardiovascular Disease

## 2016-02-02 ENCOUNTER — Encounter: Payer: Self-pay | Admitting: Cardiovascular Disease

## 2016-02-02 VITALS — BP 148/68 | HR 62 | Ht 65.5 in | Wt 146.8 lb

## 2016-02-02 DIAGNOSIS — I6523 Occlusion and stenosis of bilateral carotid arteries: Secondary | ICD-10-CM

## 2016-02-02 DIAGNOSIS — I10 Essential (primary) hypertension: Secondary | ICD-10-CM

## 2016-02-02 DIAGNOSIS — R600 Localized edema: Secondary | ICD-10-CM

## 2016-02-02 DIAGNOSIS — I701 Atherosclerosis of renal artery: Secondary | ICD-10-CM | POA: Diagnosis not present

## 2016-02-02 DIAGNOSIS — I739 Peripheral vascular disease, unspecified: Secondary | ICD-10-CM

## 2016-02-02 DIAGNOSIS — I452 Bifascicular block: Secondary | ICD-10-CM

## 2016-02-02 DIAGNOSIS — E785 Hyperlipidemia, unspecified: Secondary | ICD-10-CM

## 2016-02-02 DIAGNOSIS — I480 Paroxysmal atrial fibrillation: Secondary | ICD-10-CM

## 2016-02-02 MED ORDER — EZETIMIBE 10 MG PO TABS: 10.0000 mg | ORAL_TABLET | Freq: Every day | ORAL | 3 refills | Status: DC

## 2016-02-02 MED ORDER — LOVASTATIN 40 MG PO TABS: 20.0000 mg | ORAL_TABLET | Freq: Every day | ORAL | 2 refills | Status: DC

## 2016-02-02 NOTE — Patient Instructions (Signed)
Your physician has recommended you make the following change in your medication:   1.) the lovastatin has been changed to 20 mg daily ( 1/2 tablet of your 40 mg )  2.) the torsemide had been changed to take [place].  3.) start new prescription for generic zetia. This has been sent to your pharmacy.  Your physician recommends that you return for lab work in:  2-3 months.  Your physician wants you to follow-up in: 6 months or sooner if needed. You will receive a reminder letter in the mail two months in advance. If you don't receive a letter, please call our office to schedule the follow-up appointment.  If you need a refill on your cardiac medications before your next appointment, please call your pharmacy.

## 2016-02-03 NOTE — Progress Notes (Signed)
Patient ID: [name], male   DOB: Aug 17, 1945, 70 y.o.   MRN: [id number]    Primary MD:  Dr. Garwin Brothers  HPI: [name] is a 70 y.o. male who is a former patient of Dr. Rollene Fare.  He presents for a 9 month follow-up cardiology evaluation.  [name] has an extensive cardiac and peripheral vascular history. He underwent initial CABG revascularization surgery in [name] in 1984. In 1996 he required re-do CABG revascularization surgery after all grafts were found to be occluded. At that time he also underwent right carotid endarterectomy. In 1998 he underwent stenting of his left main coronary artery and also had stenting done to the vein graft supplying the marginal vessel. He has history of right renal artery stenosis and underwent initial stenting with predilatation 2005. He has documented chronic right bundle branch block. He also significant peripheral vascular disease with bilateral SFA occlusion. He has known left subclavian artery occlusion with right subclavian stenosis. Has a history of hypertension, hyperlipidemia, and remotely smoked cigarettes. His last catheterization was done in November 2012 which showed significant native CAD with evidence for patent left main stent. He had an occluded circumflex marginal vessel from the AV groove circumflex. There was 80% ostial RCA stenosis with 90% stenosis in the mid right coronary artery proximal to the RV marginal branch and total occlusion of the mid RCA. An occluded vein graft supplying the right coronary artery but evidence for collateralization to the distal right artery the left coronary injection and injection of the grafts. The vein supplying the marginal vessel with the previously placed ostial proximal Taxus stent had in-stent narrowing of 50% followed by 95% stenosis in the body of the proximal third of the vessel graft prior to the previously placed mid grafts 3.0x20 mm Taxus stent. At that time, he underwent difficult but  successful intervention involving long segmental stenosis of the vein graft supplying the circumflex marginal vessel. Additional tandem 3.0x38 mm and 3.0x8 mm Taxus stents were inserted with excellent angiographic result.   He saw Dr. Rollene Fare in August 2014 per at that time due to concern for progressive carotid stenoses repeat Doppler studies were done and he also saw Dr. Sherren Mocha early for followup evaluation. Repeat imaging showed a patent right carotid endarterectomy site the Doppler velocity suggestive of 40  percent stenosis of the bilateral proximal internal carotid arteries. There was left subclavian steal noted with retrograde left vertebral artery flow, monophasic left subclavian artery flow and significant difference in the bilateral brachial pressures.  He established cardiology care with me in December 2014 December, he has continued to remain stable from a cardiac standpoint.  He is participating in cardiac rehabilitation at [facility] on Monday, Tuesday, and Wednesdays, and has been doing so for 10 years.  A nuclear perfusion study in May 2015 demonstrated mild inferobasal ischemia, most likely secondary to RCA distribution.    On 03/25/2014 he was seen by Dr. Martinique after he developed an episode of atrial fibrillation the day before.  An ECG in Dr. Latina Craver  office showed atrial fibrillation with rate of 120.  His atenolol was increased to 50 mg twice a day, and he converted to sinus rhythm after 18 hours.  When he saw Dr. Martinique the following day he was in sinus rhythm.  On the increased dose of beta blocker he denies recurrent palpitations.  He tells me he recently underwent a colonoscopy and had 3 polyps removed, but also required cauterization for another site  that was oozing blood.  This reason, he was not restarted on anticoagulation but has been maintained on low-dose aspirin and Plavix.  Since I last saw him , he had seen Dr. Gwenlyn Found in follow-up of his PVD and is status post left  renal stenting.  He also is followed by Dr. Donnetta Hutching for his carotid disease.  In August 2017 he was seen by Kerin Ransom after he was found to be in atrial fibrillation 1 week previously at cardiac rehabilitation.  He was started on amiodarone 20 mg twice a day for 1 week and then 200 mg daily and his atenolol was reduced to 25 mg twice a day.  He was seen several weeks later on 10/29/2015 by Lesia Hausen and was back in sinus rhythm.  He underwent carotid duplex imaging.  On 12/23/2015.  The pain, right carotid endarterectomy site with Doppler velocities suggestive of a 60-79% stenosis.  The left internal carotid artery velocity suggested a 40-59% stenoses.  Renal duplex imaging to just a patent right renal artery stent.  There was stable greater than 60% left renal artery stenosis.  He denies any chest pain but admits to lower extremity swelling.  He presents for evaluation.    Past Medical History:  Diagnosis Date  . Anemia   . Angina   . Atrial fibrillation ([place])   . Blood transfusion   . Bronchitis   . Bulging discs    "8 of them; thoracic, lumbar, sacral area"  . Chronic back pain   . Colon polyps   . Complication of anesthesia   . Coronary artery disease 01/24/11   Successful PCI long segmental stensois  vein graft to the CX marginal vessel-graft previously stented prox. 3.0x29m TAXUS stent mid seg 3.0x21mTAXUS stent now tandem stens of 3.0x3822m 3.0x8mm25maced with the seg. 50,80 & 99% stenosis reduced to 0%  . DDD (degenerative disc disease)   . Dysrhythmia    "PAC's and PVC's"  . Esophageal dilatation    "have had it done 7 times; last time ~ 2001"  . Esophageal stricture   . GERD (gastroesophageal reflux disease)   . Headache(784.0)   . Hiatal hernia   . Hyperlipidemia   . Myocardial infarction 1991  . Myocardial infarction 2005   "had 3 heart attacks this year"  . Paroxysmal atrial fibrillation (HCC)   . Pyloric stenosis   . RBBB   . Renal artery stenosis (HCC)     hhistory of right renal artery stenting and re-intervention for "in-stent restenosis.  . [place]ary artery [place]    "when I had the heart attacks"  . Subclavian artery stenosis, left (HCC)[place]. Ulcer (HCC)[place] "small; Dr. PerrHenrene Pastornd it 02/2010"    Past Surgical History:  Procedure Laterality Date  . ADENOIDECTOMY     "when I was an infant"  . APPENDECTOMY  1953  . CAROTID ENDARTERECTOMY Right 1996   CE  . CORONARY ANGIOPLASTY WITH STENT PLACEMENT  01/24/11   "today makes a total of 9 stents"  . CORONARY ARTERY BYPASS GRAFT  1984   CABG X 4  . CORONARY ARTERY BYPASS GRAFT  1996   CABG X 4  . LEFT HEART CATHETERIZATION WITH CORONARY/GRAFT ANGIOGRAM N/A 01/24/2011   Procedure: LEFT HEART CATHETERIZATION WITH COROBeatrix Fettersurgeon: ThomTroy Sine;  [facility];  Service: Cardiovascular;  Laterality: N/A;  . pylondial cyst removal    . RETINAL DETACHMENT SURGERY  04/2007   right  . TONSILLECTOMY  1972    Allergies  Allergen Reactions  . Codeine Nausea And Vomiting  . Sulfonamide Derivatives Other (See Comments)    REACTION: rash    Current Outpatient Prescriptions  Medication Sig Dispense Refill  . amiodarone (PACERONE) 200 MG tablet Take 200 mg by mouth daily.    Marland Kitchen aspirin 81 MG tablet Take 81 mg by mouth daily.    Marland Kitchen atenolol (TENORMIN) 50 MG tablet Take 0.5 tablets (25 mg total) by mouth 2 (two) times daily. 225 tablet 0  . butalbital-aspirin-caffeine (FIORINAL) 50-325-40 MG per capsule Take 1 capsule by mouth every 4 (four) hours as needed. FOR HEADACHE.     Marland Kitchen clopidogrel (PLAVIX) 75 MG tablet Take 1 tablet (75 mg total) by mouth daily. 90 tablet 2  . Coenzyme Q10 (CO Q-10) 100 MG CAPS Take 1 tablet by mouth daily.     Marland Kitchen dutasteride (AVODART) 0.5 MG capsule Take 0.5 mg by mouth daily.     Marland Kitchen esomeprazole (NEXIUM) 40 MG capsule TAKE 1 CAPSULE TWICE DAILY 180  capsule 1  . loratadine (CLARITIN) 10 MG tablet Take 1 tablet by mouth daily as needed for allergies, rhinitis or itching.   3  . losartan (COZAAR) 50 MG tablet TAKE 1 TABLET (50 MG TOTAL) BY MOUTH DAILY. 90 tablet 2  . lovastatin (MEVACOR) 40 MG tablet Take 0.5 tablets (20 mg total) by mouth at bedtime. 90 tablet 2  . Multiple Vitamins-Minerals (MULTIVITAMINS THER. W/MINERALS) TABS Take 1 tablet by mouth daily.      . nitroGLYCERIN (NITROLINGUAL) 0.4 MG/SPRAY spray Place 1 spray under the tongue every 5 (five) minutes x 3 doses as needed for chest pain. 0.4 g 3  . RANEXA 1000 MG SR tablet TAKE 1 TABLET BY MOUTH TWICE DAILY 180 tablet 2  . torsemide (DEMADEX) 20 MG tablet Take 20 mg by mouth every other day.    . vitamin E 200 UNIT capsule Take 200 Units by mouth daily.      Marland Kitchen ezetimibe (ZETIA) 10 MG tablet Take 1 tablet (10 mg total) by mouth daily. 90 tablet 3   No current facility-administered medications for this visit.     Social History   Social History  . Marital status: Married    Spouse name: N/A  . Number of children: N/A  . Years of education: N/A   Occupational History  . Not on file.   Social History Main Topics  . Smoking status: Former Smoker    Packs/day: 1.00    Years: 38.00    Types: Cigarettes    Quit date: 02/09/2005  . Smokeless tobacco: Never Used     Comment: "quit smoking cigarrettes 2006"  . Alcohol use No  . Drug use: No  . Sexual activity: Not on file   Other Topics Concern  . Not on file   Social History Narrative  . No narrative on file   Socially he is married has 2 children 2 stepchildren. He quit tobacco in 2000.  Family History  Problem Relation Age of Onset  . Breast cancer Paternal Aunt   . Diabetes Sister   . Ulcers Father   . Heart disease Father   . Heart failure Father   . Heart attack Father   . AAA (abdominal aortic aneurysm) Father   . Stroke Mother   . Hypertension Mother   . Colon  cancer Neg Hx    ROS General:  Negative; No fevers, chills, or night sweats;  HEENT: History of a detached retina in his right eye; No changes in  hearing, sinus congestion, difficulty swallowing Pulmonary: Negative; No cough, wheezing, shortness of breath, hemoptysis Cardiovascular: see HPI;  GI: s/p polypectomy and rare red blood per rectum followed by Dr. Henrene Pastor. GU: Occasional concentrated or  dark urine without blood Musculoskeletal: Negative; no myalgias, joint pain, or weakness Hematologic/Oncology: Negative; no easy bruising, bleeding Endocrine: Negative; no heat/cold intolerance; no diabetes Neuro: Negative; no changes in balance, headaches Skin: Negative; No rashes or skin lesions Psychiatric: Negative; No behavioral problems, depression Sleep: Negative; No snoring, daytime sleepiness, hypersomnolence, bruxism, restless legs, hypnogognic hallucinations, no cataplexy Other comprehensive 14 point system review is negative.   PE BP (!) 148/68   Pulse 62   Ht 5' 5.5" (1.664 m)   Wt 146 lb 12.8 oz (66.6 kg)   BMI 24.06 kg/m    Repeat BP 134/70  Wt Readings from Last 3 Encounters:  02/02/16 146 lb 12.8 oz (66.6 kg)  12/22/15 144 lb (65.3 kg)  10/29/15 141 lb 12.8 oz (64.3 kg)   General: Alert, oriented, no distress.  Skin: normal turgor, no rashes HEENT: Normocephalic, atraumatic. Pupils round and reactive; sclera anicteric;no lid lag.  Nose without nasal septal hypertrophy Mouth/Parynx benign; Mallinpatti scale 3 Neck: No JVD, bilateral carotid bruits, right greater than left  Lungs: clear to ausculatation and percussion; no wheezing or rales Chest wall: no tenderness to palpitation Heart: RRR, s1 s2 normal with a 2/6 systolic murmur along the left sternal border. No S3 or S4 gallop. No diastolic murmurs.  No S3 or S4 gallop Abdomen: soft, nontender; no hepatosplenomehaly, BS+; abdominal aorta nontender and not dilated by palpation. Back: no CVA tenderness Pulses 2+ Extremities:  1+ bilateral ankle  edema; no clubbing cyanosis, Homan's sign negative  Neurologic: grossly nonfocal Psychologic: normal affect and mood.  ECG (independently read by me): Sinus rhythm with a PVC.  Right bundle branch block and probable left posterior hemiblock.  Inferior Q waves with preserved R waves.  February 2017 ECG (independently read by me): normal sinus rhythm at 61 bpm. Probable left atrial enlargement. Right bundle branch block.  ECG (independently read by me): Sinus bradycardia 58 bpm.  Increased QTc interval 473 ms.  Right bundle-branch block.  May 2016 ECG (independently read by me): Sinus bradycardia 59 bpm with right bundle branch block.I ST-T changes.  QTc interval 475 ms.  PR interval 182 ms.  No ectopy.  February 2016 ECG (independently read by me) normal sinus rhythm at 63 bpm.  Right bundle-branch block.  Probable left posterior fascicular block.  PR interval 170 ms.  QTC interval 480 ms.  November 2015 ECG (independently read by me): Normal sinus rhythm at 62 bpm.  Right bundle branch block.  Small nondiagnostic inferior Q waves.  Prior May 2015ECG : sinus rhythm at 58 beats per minute.  Right bundle branch block.  Prior December 2014 ECG: Sinus rhythm with sinus bradycardia 55 beats per minute. Right bundle branch block.  LABS: BMP Latest Ref Rng & Units 10/14/2015 11/25/2014 08/01/2014  Glucose 65 - 99 mg/dL 96 89 96  BUN 7 - 25 mg/dL 26(H) 26(H) 26(H)  Creatinine 0.70 - 1.18 mg/dL 1.12 1.06 1.21  Sodium 135 - 146 mmol/L 140 138 140  Potassium 3.5 - 5.3 mmol/L 5.1 4.8 4.6  Chloride 98 - 110 mmol/L 110 106 108  CO2 20 - 31  mmol/L 23 26 25   Calcium 8.6 - 10.3 mg/dL 8.7 8.7 9.2   Hepatic Function Latest Ref Rng & Units 10/14/2015 11/25/2014 08/01/2014  Total Protein 6.1 - 8.1 g/dL 5.8(L) 6.0(L) 6.2  Albumin 3.6 - 5.1 g/dL 3.7 3.9 3.9  AST 10 - 35 U/L 17 15 16   ALT 9 - 46 U/L 11 10 12   Alk Phosphatase 40 - 115 U/L 64 75 69  Total Bilirubin 0.2 - 1.2 mg/dL 0.4 0.3 0.4   CBC Latest Ref  Rng & Units 11/25/2014 01/25/2011 01/25/2011  WBC 4.0 - 10.5 K/uL 7.1 7.4 -  Hemoglobin 13.0 - 17.0 g/dL 13.1 9.8(L) 10.1(L)  Hematocrit 39.0 - 52.0 % 38.3(L) 28.0(L) 28.5(L)  Platelets 150 - 400 K/uL 188 120(L) -   Lab Results  Component Value Date   MCV 97.0 11/25/2014   MCV 94.0 01/25/2011   MCV 93.2 01/25/2011   Lab Results  Component Value Date   TSH 0.83 10/14/2015    Lipid Panel     Component Value Date/Time   CHOL 172 08/01/2014 0852   TRIG 90 08/01/2014 0852   HDL 66 08/01/2014 0852   CHOLHDL 2.6 08/01/2014 0852   VLDL 18 08/01/2014 0852   LDLCALC 88 08/01/2014 0852     RADIOLOGY: No results found.   ASSESSMENT AND PLAN: Mr. Volney Reierson is a 70 year old gentleman who has an extensive CAD/PVD history, and is 33 years status post his initial CABG revascularization surgery done in [place] in 1984 and 21 years status post his second CABG surgery in 1996. He has had multiple peripheral vascular procedures including carotid endarterectomy, renal stenting, and is documented SFA occlusion. His last catheterization intervention in 2012 demonstrated diffuse disease in the vein graft supplying the marginal vessel and 2 additional Taxus stents inserted commencing ostially and extending to the proximal portion of the recently placed mid prior stent. He has a known occluded graft to the RCA with a mid RCA occlusion with left to right collaterals. He also has subclavian steal. He does have mild hypokinesis inferolaterally in the basal inferior wall with an EF of 45% at that catheterization. He does have chronic right bundle branch block. He has not been demonstrated to have an abdominal aortic aneurysm. When I had seen him previously he had complained of mild chest anginal symptomatology typically when he starts exercise and particularly after eating. He had potential areas of myocardium which may be responsible for ischemia.  At that time, I added  Ranexa 500 mg twice a day to his  medical regimen and subsequently this was further titrated to 1000 mg twice a day , which has resolved his symptomatology.  In January 2016 he was documented to have paroxysmal atrial fibrillation which responded to increased beta blocker therapy and lasted for ~18 hours.  Because of GI issues, he is not on full dose anticoagulation. He continues to be on aspirin and Plavix.  He was found to have recurrent atrial fibrillation in August which responded to amiodarone therapy.  Presently, he is maintaining sinus rhythm.  When amiodarone was started, his lovastatin dose was not reduced.  Due to potential interactions at the higher dose.  I have recommended he reduce his lovastatin to 20 mg daily but will add Zetia 10 mg for continued very aggressive lipid management.  I reviewed recent lipid studies from October 2017 done by his primary physician, which showed a total cholesterol 155, HDL 73, triglycerides 49, and LDL 73 by Dr.Saad Amin.  He has  progressive lower extremity edema today and I have recommended he increase his torsemide and take 20 mg every other day instead of rarely taking this.  He will undergo repeat blood work in 2-3 months.  I will see him in 6 months for cardiology reevaluation.  Time spent: 25 minutes  Troy Sine, MD, [facility]  02/03/2016 5:49 PM

## 2016-02-17 ENCOUNTER — Other Ambulatory Visit: Payer: Self-pay | Admitting: Cardiovascular Disease

## 2016-03-17 ENCOUNTER — Other Ambulatory Visit: Payer: Self-pay | Admitting: Cardiovascular Disease

## 2016-03-17 NOTE — Telephone Encounter (Signed)
Rx(s) sent to pharmacy electronically.  

## 2016-05-04 ENCOUNTER — Encounter: Payer: Self-pay | Admitting: Cardiovascular Disease

## 2016-05-13 ENCOUNTER — Other Ambulatory Visit: Payer: Self-pay | Admitting: Cardiovascular Disease

## 2016-05-25 ENCOUNTER — Telehealth: Payer: Self-pay | Admitting: Cardiovascular Disease

## 2016-05-25 NOTE — Telephone Encounter (Signed)
Pt notified, he will cal PCP and have them change Antibiotic

## 2016-05-25 NOTE — Telephone Encounter (Signed)
Please have primary MD choose a different antibiotic.

## 2016-05-25 NOTE — Telephone Encounter (Signed)
New message   Pt c/o medication issue:  1. Name of Medication: amiodarone and z-pack 2. How are you currently taking this medication (dosage and times per day)?  amiodarone 200mg  & z-pack 250mg   3. Are you having a reaction (difficulty breathing--STAT)? no  4. What is your medication issue? The pharmacist told pt that the medication is a conflict to take them both

## 2016-06-09 ENCOUNTER — Encounter: Payer: Self-pay | Admitting: Family

## 2016-06-16 ENCOUNTER — Other Ambulatory Visit: Payer: Self-pay | Admitting: Internal Medicine

## 2016-06-16 DIAGNOSIS — R002 Palpitations: Secondary | ICD-10-CM | POA: Insufficient documentation

## 2016-06-17 ENCOUNTER — Ambulatory Visit (INDEPENDENT_AMBULATORY_CARE_PROVIDER_SITE_OTHER): Payer: Medicare Other

## 2016-06-17 DIAGNOSIS — R002 Palpitations: Secondary | ICD-10-CM

## 2016-06-20 ENCOUNTER — Other Ambulatory Visit: Payer: Self-pay | Admitting: *Deleted

## 2016-06-20 DIAGNOSIS — I6523 Occlusion and stenosis of bilateral carotid arteries: Secondary | ICD-10-CM

## 2016-06-21 ENCOUNTER — Encounter: Payer: Self-pay | Admitting: Family

## 2016-06-21 ENCOUNTER — Ambulatory Visit (HOSPITAL_COMMUNITY)
Admission: RE | Admit: 2016-06-21 | Discharge: 2016-06-21 | Disposition: A | Discharge status: Home / Self Care | Payer: Medicare Other | Source: Ambulatory Visit | Attending: Family | Admitting: Family

## 2016-06-21 ENCOUNTER — Ambulatory Visit (INDEPENDENT_AMBULATORY_CARE_PROVIDER_SITE_OTHER): Payer: Medicare Other | Admitting: Family

## 2016-06-21 VITALS — BP 91/59 | HR 90 | Temp 97.6°F | Resp 16 | Ht 65.0 in | Wt 148.0 lb

## 2016-06-21 DIAGNOSIS — Z9889 Other specified postprocedural states: Secondary | ICD-10-CM

## 2016-06-21 DIAGNOSIS — I6523 Occlusion and stenosis of bilateral carotid arteries: Secondary | ICD-10-CM | POA: Diagnosis present

## 2016-06-21 DIAGNOSIS — R609 Edema, unspecified: Secondary | ICD-10-CM | POA: Diagnosis not present

## 2016-06-21 DIAGNOSIS — I771 Stricture of artery: Secondary | ICD-10-CM | POA: Diagnosis not present

## 2016-06-21 NOTE — Patient Instructions (Addendum)
Stroke Prevention Some medical conditions and behaviors are associated with an increased chance of having a stroke. You may prevent a stroke by making healthy choices and managing medical conditions. How can I reduce my risk of having a stroke?  Stay physically active. Get at least 30 minutes of activity on most or all days.  Do not smoke. It may also be helpful to avoid exposure to secondhand smoke.  Limit alcohol use. Moderate alcohol use is considered to be:  No more than 2 drinks per day for men.  No more than 1 drink per day for nonpregnant women.  Eat healthy foods. This involves:  Eating 5 or more servings of fruits and vegetables a day.  Making dietary changes that address high blood pressure (hypertension), high cholesterol, diabetes, or obesity.  Manage your cholesterol levels.  Making food choices that are high in fiber and low in saturated fat, trans fat, and cholesterol may control cholesterol levels.  Take any prescribed medicines to control cholesterol as directed by your health care provider.  Manage your diabetes.  Controlling your carbohydrate and sugar intake is recommended to manage diabetes.  Take any prescribed medicines to control diabetes as directed by your health care provider.  Control your hypertension.  Making food choices that are low in salt (sodium), saturated fat, trans fat, and cholesterol is recommended to manage hypertension.  Ask your health care provider if you need treatment to lower your blood pressure. Take any prescribed medicines to control hypertension as directed by your health care provider.  If you are 18-39 years of age, have your blood pressure checked every 3-5 years. If you are 40 years of age or older, have your blood pressure checked every year.  Maintain a healthy weight.  Reducing calorie intake and making food choices that are low in sodium, saturated fat, trans fat, and cholesterol are recommended to manage  weight.  Stop drug abuse.  Avoid taking birth control pills.  Talk to your health care provider about the risks of taking birth control pills if you are over 35 years old, smoke, get migraines, or have ever had a blood clot.  Get evaluated for sleep disorders (sleep apnea).  Talk to your health care provider about getting a sleep evaluation if you snore a lot or have excessive sleepiness.  Take medicines only as directed by your health care provider.  For some people, aspirin or blood thinners (anticoagulants) are helpful in reducing the risk of forming abnormal blood clots that can lead to stroke. If you have the irregular heart rhythm of atrial fibrillation, you should be on a blood thinner unless there is a good reason you cannot take them.  Understand all your medicine instructions.  Make sure that other conditions (such as anemia or atherosclerosis) are addressed. Get help right away if:  You have sudden weakness or numbness of the face, arm, or leg, especially on one side of the body.  Your face or eyelid droops to one side.  You have sudden confusion.  You have trouble speaking (aphasia) or understanding.  You have sudden trouble seeing in one or both eyes.  You have sudden trouble walking.  You have dizziness.  You have a loss of balance or coordination.  You have a sudden, severe headache with no known cause.  You have new chest pain or an irregular heartbeat. Any of these symptoms may represent a serious problem that is an emergency. Do not wait to see if the symptoms will go away.   Get medical help at once. Call your local emergency services (911 in [place] Do not drive yourself to the hospital. This information is not intended to replace advice given to you by your health care provider. Make sure you discuss any questions you have with your health care provider. Document Released: 03/31/2004 Document Revised: 07/30/2015 Document Reviewed: 08/24/2012 Elsevier  Interactive Patient Education  2017 Elsevier Inc.      Preventing Cerebrovascular Disease Arteries are blood vessels that carry blood that contains oxygen from the heart to all parts of the body. Cerebrovascular disease affects arteries that supply the brain. Any condition that blocks or disrupts blood flow to the brain can cause cerebrovascular disease. Brain cells that lose blood supply start to die within minutes (stroke). Stroke is the main danger of cerebrovascular disease. Atherosclerosis and high blood pressure are common causes of cerebrovascular disease. Atherosclerosis is narrowing and hardening of an artery that results when fat, cholesterol, calcium, or other substances (plaque) build up inside an artery. Plaque reduces blood flow through the artery. High blood pressure increases the risk of bleeding inside the brain. Making diet and lifestyle changes to prevent atherosclerosis and high blood pressure lowers your risk of cerebrovascular disease. What nutrition changes can be made?  Eat more fruits, vegetables, and whole grains.  Reduce how much saturated fat you eat. To do this, eat less red meat and fewer full-fat dairy products.  Eat healthy proteins instead of red meat. Healthy proteins include:  Fish. Eat fish that contains heart-healthy omega-3 fatty acids, twice a week. Examples include salmon, albacore tuna, mackerel, and herring.  Chicken.  Nuts.  Low-fat or nonfat yogurt.  Avoid processed meats, like bacon and lunchmeat.  Avoid foods that contain:  A lot of sugar, such as sweets and drinks with added sugar.  A lot of salt (sodium). Avoid adding extra salt to your food, as told by your health care provider.  Trans fats, such as margarine and baked goods. Trans fats may be listed as "partially hydrogenated oils" on food labels.  Check food labels to see how much sodium, sugar, and trans fats are in foods.  Use vegetable oils that contain low amounts of  saturated fat, such as olive oil or canola oil. What lifestyle changes can be made?  Drink alcohol in moderation. This means no more than 1 drink a day for nonpregnant women and 2 drinks a day for men. One drink equals 12 oz of beer, 5 oz of wine, or 1 oz of hard liquor.  If you are overweight, ask your health care provider to recommend a weight-loss plan for you. Losing 5-10 lb (2.2-4.5 kg) can reduce your risk of diabetes, atherosclerosis, and high blood pressure.  Exercise for 30?60 minutes on most days, or as much as told by your health care provider.  Do moderate-intensity exercise, such as brisk walking, bicycling, and water aerobics. Ask your health care provider which activities are safe for you.  Do not use any products that contain nicotine or tobacco, such as cigarettes and e-cigarettes. If you need help quitting, ask your health care provider. Why are these changes important? Making these changes lowers your risk of many diseases that can cause cerebrovascular disease and stroke. Stroke is a leading cause of death and disability. Making these changes also improves your overall health and quality of life. What can I do to lower my risk? The following factors make you more likely to develop cerebrovascular disease:  Being overweight.  Smoking.  Being physically   inactive.  Eating a high-fat diet.  Having certain health conditions, such as:  Diabetes.  High blood pressure.  Heart disease.  Atherosclerosis.  High cholesterol.  Sickle cell disease. Talk with your health care provider about your risk for cerebrovascular disease. Work with your health care provider to control diseases that you have that may contribute to cerebrovascular disease. Your health care provider may prescribe medicines to help prevent major causes of cerebrovascular disease. Where to find more information: Learn more about preventing cerebrovascular disease from:  [place], Lung, and  [place]: MoAnalyst.de  Centers for Disease Control and Prevention: http://www.curry-wood.biz/ Summary  Cerebrovascular disease can lead to a stroke.  Atherosclerosis and high blood pressure are major causes of cerebrovascular disease.  Making diet and lifestyle changes can reduce your risk of cerebrovascular disease.  Work with your health care provider to get your risk factors under control to reduce your risk of cerebrovascular disease. This information is not intended to replace advice given to you by your health care provider. Make sure you discuss any questions you have with your health care provider. Document Released: 03/08/2015 Document Revised: 09/11/2015 Document Reviewed: 03/08/2015 Elsevier Interactive Patient Education  2017 Elsevier Inc.      To measure for knee high compression hose: Measure the length of calf, largest circumference of calf, and ankle circumference first thing in the morning before your legs have a chance to swell.  Take these 3 measurements with you to obtain 20-30 mm mercury graduated knee high compression hose.  Put the stockings on in the morning, remove at bedtime.

## 2016-06-21 NOTE — Progress Notes (Signed)
Chief Complaint: Follow up Extracranial Carotid Artery Stenosis   History of Present Illness  [name] is a 71 y.o. male who is s/p right carotid endarterectomy by Dr. Donnetta Hutching in 1996. He returns today for carotid artery surveillance and evaluaion. He does have a known moderate left carotid stenosis and left subclavian artery stenosis, and is being followed with serial ultrasounds.  He has a history of significant coronary disease with multiple prior stenting procedures and myocardial infarctions. He is extremely active in a walking program and is comfortable with this.  He does have known superficial femoral artery occlusive disease, but he no longer has claudication symptoms in his legs since he has been exercising so much.  He denies any history of stroke or TIA, specifically he denies a history of amaurosis fugax or monocular blindness,unilateral facial drooping, hemiplegia, or receptive or expressive aphasia.   He denies any steal symptoms in either upper extremity, denies cold sensation in either upper extremity, denies pain in either extremity. He denies feeling dizzy, but does feel off balance if he turns quickly; states he has tinnitus. He has been evaluated by an ENT in [facility] re this.   He also reports that he has known sacral, lumbar, and thoracic "bulging discs", degenerative disc disease, found on CT of his spine, per patient. He also did years of heavy lifting as an EMT. He also reports that he has scoliosis.  He had been doing cardiac rehab/wellness program at [facility] 3 days/week since 2005, states this has helped his PAD, he no longer has claudication symptoms in his legs with walking. But has has been sick with bronchitis lately and not done this until he feels better. His PCP has treated him for this with antibx, prednisone, and albuterol neb and MDI.   Pt states that his cardiologist is monitoring his aorta, iliac arteries, renal arteries, and  checks ABI's regularly.   Pt denies any recent chest pain, last chest pain episode was in 2012, had a cardiac stent placed then; states in 1996 his LIMA was used as a bypass vessel. He had 4 vessel CABG in the 1980's and again in 1996.  He was diagnosed with paroxysmal atrial fib, takes amiodarone and metoprolol prescribed by Dr. Claiborne Billings; he was not started on an anticoagulant since it was found that he has a small GI bleed.  He reports dizziness since about 2015 or 2016 that is stable.   Pt Diabetic: No Pt smoker: former smoker, quit in 2006  Pt meds include: Statin : Yes ASA: Yes Other anticoagulants/antiplatelets: Plavix  Past Medical History:  Diagnosis Date  . Anemia   . Angina   . Atrial fibrillation ([place])   . Blood transfusion   . Bronchitis   . Bulging discs    "8 of them; thoracic, lumbar, sacral area"  . Chronic back pain   . Colon polyps   . Complication of anesthesia   . Coronary artery disease 01/24/11   Successful PCI long segmental stensois  vein graft to the CX marginal vessel-graft previously stented prox. 3.0x7mm TAXUS stent mid seg 3.0x64mm TAXUS stent now tandem stens of 3.0x28mm & 3.0x72mm placed with the seg. 50,80 & 99% stenosis reduced to 0%  . DDD (degenerative disc disease)   . Dysrhythmia    "PAC's and PVC's"  . Esophageal dilatation    "have had it done 7 times; last time ~ 2001"  . Esophageal stricture   . GERD (gastroesophageal reflux disease)   .  Headache(784.0)   . Hiatal hernia   . Hyperlipidemia   . Myocardial infarction ([place]) 1991  . Myocardial infarction [facility]) 2005   "had 3 heart attacks this year"  . Paroxysmal atrial fibrillation ([facility])   . Pyloric stenosis   . RBBB   . Renal artery stenosis ([facility])    hhistory of right renal artery stenting and re-intervention for "in-stent restenosis.  . S/P CABG (coronary artery bypass graft) [place]  . Shortness of breath    "when I had the heart  attacks"  . Subclavian artery stenosis, left ([place])   . Ulcer    "small; Dr. Henrene Pastor found it 02/2010"    Social History Social History  Substance Use Topics  . Smoking status: Former Smoker    Packs/day: 1.00    Years: 38.00    Types: Cigarettes    Quit date: 02/09/2005  . Smokeless tobacco: Never Used     Comment: "quit smoking cigarrettes 2006"  . Alcohol use No    Family History Family History  Problem Relation Age of Onset  . Breast cancer Paternal Aunt   . Diabetes Sister   . Ulcers Father   . Heart disease Father   . Heart failure Father   . Heart attack Father   . AAA (abdominal aortic aneurysm) Father   . Stroke Mother   . Hypertension Mother   . Colon cancer Neg Hx     Surgical History Past Surgical History:  Procedure Laterality Date  . ADENOIDECTOMY     "when I was an infant"  . APPENDECTOMY  1953  . CAROTID ENDARTERECTOMY Right 1996   CE  . CORONARY ANGIOPLASTY WITH STENT PLACEMENT  01/24/11   "today makes a total of 9 stents"  . CORONARY ARTERY BYPASS GRAFT  1984   CABG X 4  . CORONARY ARTERY BYPASS GRAFT  1996   CABG X 4  . LEFT HEART CATHETERIZATION WITH CORONARY/GRAFT ANGIOGRAM N/A 01/24/2011   Procedure: LEFT HEART CATHETERIZATION WITH Beatrix Fetters;  Surgeon: Troy Sine, MD;  Location: [facility] C[facility] LAB;  Service: Cardiovascular;  Laterality: N/A;  . pylondial cyst removal    . RETINAL DETACHMENT SURGERY  04/2007   right  . TONSILLECTOMY  1972    Allergies  Allergen Reactions  . Codeine Nausea And Vomiting  . Sulfonamide Derivatives Other (See Comments)    REACTION: rash    Current Outpatient Prescriptions  Medication Sig Dispense Refill  . amiodarone (PACERONE) 200 MG tablet Take 200 mg by mouth daily.    Marland Kitchen aspirin 81 MG tablet Take 81 mg by mouth daily.    Marland Kitchen atenolol (TENORMIN) 50 MG tablet Take 0.5 tablets (25 mg total) by mouth 2 (two) times daily. 225 tablet 0  . butalbital-aspirin-caffeine (FIORINAL) 50-325-40 MG per  capsule Take 1 capsule by mouth every 4 (four) hours as needed. FOR HEADACHE.     Marland Kitchen clopidogrel (PLAVIX) 75 MG tablet TAKE 1 TABLET (75 MG TOTAL) BY MOUTH DAILY. 90 tablet 3  . Coenzyme Q10 (CO Q-10) 100 MG CAPS Take 1 tablet by mouth daily.     Marland Kitchen dutasteride (AVODART) 0.5 MG capsule Take 0.5 mg by mouth daily.     Marland Kitchen esomeprazole (NEXIUM) 40 MG capsule TAKE ONE CAPSULE BY MOUTH TWICE DAILY 180 capsule 0  . ezetimibe (ZETIA) 10 MG tablet Take 1 tablet (10 mg total) by mouth daily. 90 tablet 3  . loratadine (CLARITIN) 10 MG tablet Take  1 tablet by mouth daily as needed for allergies, rhinitis or itching.   3  . losartan (COZAAR) 50 MG tablet TAKE 1 TABLET (50 MG TOTAL) BY MOUTH DAILY. 90 tablet 2  . lovastatin (MEVACOR) 40 MG tablet Take 0.5 tablets (20 mg total) by mouth at bedtime. 90 tablet 2  . Multiple Vitamins-Minerals (MULTIVITAMINS THER. W/MINERALS) TABS Take 1 tablet by mouth daily.      . nitroGLYCERIN (NITROLINGUAL) 0.4 MG/SPRAY spray Place 1 spray under the tongue every 5 (five) minutes x 3 doses as needed for chest pain. 0.4 g 3  . RANEXA 1000 MG SR tablet TAKE 1 TABLET BY MOUTH TWICE DAILY 180 tablet 2  . rOPINIRole (REQUIP) 0.5 MG tablet TAKE 1 TABLET (0.5 MG) BY ORAL ROUTE 1-3 HOURS BEFORE BEDTIME AND IN AM  2  . torsemide (DEMADEX) 20 MG tablet Take 20 mg by mouth every other day.    . vitamin E 200 UNIT capsule Take 200 Units by mouth daily.       No current facility-administered medications for this visit.     Review of Systems : See HPI for pertinent positives and negatives.  Physical Examination  Vitals:   06/21/16 1444 06/21/16 1447  BP: 117/62 (!) 91/59  Pulse: 82 90  Resp: 16   Temp: 97.6 F (36.4 C)   TempSrc: Oral   SpO2: 98%   Weight: 148 lb (67.1 kg)   Height: 5\' 5"  (1.651 m)    Body mass index is 24.63 kg/m.  General: WDWN male in NAD GAIT:normal Eyes: PERRLA Pulmonary: Respirations are non-labored, limited air movement in all fields, no rales or  rhonchi, + inspiratory and expiratory wheezes in all fields.   Cardiac: Irregular rhythm, controlled rate, no detected murmur.  VASCULAR EXAM Carotid Bruits Right Left   Positive Positive   Aorta is palpable. Radial pulses are 2+ right and 1+ left palpable.      LE Pulses Right Left   FEMORAL 2+ palpable 1+ palpable    POPLITEAL not palpable  not palpable   POSTERIOR TIBIAL not palpable  not palpable    DORSALIS PEDIS  ANTERIOR TIBIAL not palpable  not palpable     Gastrointestinal: soft, nontender, BS WNL, no r/g,no palpable masses.   Musculoskeletal: No muscle atrophy/wasting. M/S 5/5 throughout, Extremities without ischemic changes. 1+ right and 2+ left ankle and pretibial pitting edema.  Neurologic: A&O X 3; Appropriate Affect, Speech is normal CN 2-12 intact, Pain and light touch intact in extremities, Motor exam as listed above.     Assessment: KHYREE CARILLO is a 72 y.o. male who is s/p right carotid endarterectomy in 1996 and has known left subclavian artery stenosis. He has no remote nor recent history of stroke or TIA. He has no steal symptoms in either upper extremity.   He denies claudication symptoms with walking. There are no signs of ischemia in his feet or legs.   He has dependent edema in his lower legs that mostly resolves with overnight elevation of his legs. Knee high graduated stocking advised; see pt instructions.   Pt states that his cardiologist is monitoring his aorta, iliac arteries, and renal arteries  DATA Today's carotid Duplex suggests 40-59% right ICA stenoses (CEA site), and <40% left ICA stenosis.  Monophasic left subclavian artery and retrograde left vertebral artery flow; greater than 20 mm Hg difference in the bilateral brachial pressures. Right  vertebral artery flow is antegrade, right subclavian artery waveforms are normal. Seems to be  less stenosis in bilateral internal carotid arteries today than the last exam on 12-22-15.    Plan: Follow-up in 9 months with Carotid Duplex scan.    I discussed in depth with the patient the nature of atherosclerosis, and emphasized the importance of maximal medical management including strict control of blood pressure, blood glucose, and lipid levels, obtaining regular exercise, and continued cessation of smoking.  The patient is aware that without maximal medical management the underlying atherosclerotic disease process will progress, limiting the benefit of any interventions. The patient was given information about stroke prevention and what symptoms should prompt the patient to seek immediate medical care. Thank you for allowing [place] to participate in this patient's care.  [name] Chambers, RN, MSN, FNP-C [facility] Office: [phone]  Clinic Physician: Bridgett Larsson  06/21/16 2:54 PM

## 2016-06-22 NOTE — Addendum Note (Signed)
Addended by: Lianne Cure A on: 06/22/2016 10:18 AM   Modules accepted: Orders

## 2016-06-24 ENCOUNTER — Telehealth: Payer: Self-pay | Admitting: Cardiovascular Disease

## 2016-06-24 NOTE — Telephone Encounter (Signed)
Patient aware that results are uploaded into our system but have not been reviewed by MD. He states his PCP ordered the study. Will send a message to K. Bowman to make sure that results will be sent to PCP as well.

## 2016-06-24 NOTE — Telephone Encounter (Signed)
New message    Pt would like to know if you received the results yet from the holter monitor

## 2016-06-29 ENCOUNTER — Telehealth: Payer: Self-pay | Admitting: Cardiovascular Disease

## 2016-06-29 NOTE — Telephone Encounter (Signed)
Spoke with Verdis Frederickson she states that this is already taken care of

## 2016-06-29 NOTE — Telephone Encounter (Signed)
New Message    [name] with [facility] needing holter monitor paperwork

## 2016-06-30 NOTE — Telephone Encounter (Signed)
Pt wants to know if Holter Monitor result is ready?

## 2016-07-05 NOTE — Telephone Encounter (Signed)
Sent message to [facility] in monitor room asking about monitor.

## 2016-07-08 NOTE — Telephone Encounter (Signed)
This monitor was applied 06/17/16, returned by patient 06/21/16, results were imported into [facility] 06/23/16, and assigned to Dr. Claiborne Billings to review.  I will forward this as a reminder.  Thanks, [employer]

## 2016-07-21 ENCOUNTER — Ambulatory Visit (INDEPENDENT_AMBULATORY_CARE_PROVIDER_SITE_OTHER): Payer: Medicare Other | Admitting: Cardiovascular Disease

## 2016-07-21 ENCOUNTER — Ambulatory Visit: Payer: Medicare Other | Admitting: Cardiovascular Disease

## 2016-07-21 ENCOUNTER — Encounter: Payer: Self-pay | Admitting: Cardiovascular Disease

## 2016-07-21 VITALS — BP 106/68 | HR 71 | Ht 65.5 in | Wt 143.0 lb

## 2016-07-21 DIAGNOSIS — I452 Bifascicular block: Secondary | ICD-10-CM

## 2016-07-21 DIAGNOSIS — Z5181 Encounter for therapeutic drug level monitoring: Secondary | ICD-10-CM

## 2016-07-21 DIAGNOSIS — Z7901 Long term (current) use of anticoagulants: Secondary | ICD-10-CM

## 2016-07-21 DIAGNOSIS — I739 Peripheral vascular disease, unspecified: Secondary | ICD-10-CM

## 2016-07-21 DIAGNOSIS — R600 Localized edema: Secondary | ICD-10-CM

## 2016-07-21 DIAGNOSIS — I481 Persistent atrial fibrillation: Secondary | ICD-10-CM | POA: Diagnosis not present

## 2016-07-21 DIAGNOSIS — I6523 Occlusion and stenosis of bilateral carotid arteries: Secondary | ICD-10-CM

## 2016-07-21 DIAGNOSIS — I10 Essential (primary) hypertension: Secondary | ICD-10-CM

## 2016-07-21 DIAGNOSIS — I4819 Other persistent atrial fibrillation: Secondary | ICD-10-CM

## 2016-07-21 MED ORDER — ATENOLOL 50 MG PO TABS: 25.0000 mg | ORAL_TABLET | Freq: Every day | ORAL | 0 refills | Status: DC

## 2016-07-21 MED ORDER — RANOLAZINE ER 1000 MG PO TB12: ORAL_TABLET | ORAL | 2 refills | Status: DC

## 2016-07-21 MED ORDER — AMIODARONE HCL 200 MG PO TABS: 200.0000 mg | ORAL_TABLET | Freq: Two times a day (BID) | ORAL | 3 refills | Status: DC

## 2016-07-21 MED ORDER — APIXABAN 5 MG PO TABS: 5.0000 mg | ORAL_TABLET | Freq: Two times a day (BID) | ORAL | 3 refills | Status: DC

## 2016-07-21 NOTE — Patient Instructions (Addendum)
Your physician has recommended you make the following change in your medication:   1.) STOP the aspirin and clopidogrel.  2.) the atenolol has been decreased to 1/2 tablet ONCE A DAY ( 25 mg).  3.) the Ranexa has been changed to 500 mg twice a day. ( 1/2 tablet of the 1000 mg)  4.) start new prescription for Eilquis 5 mg tablets  twice a day.  5.) the amiodarone has been increased to 200 mg twice a day.  Your physician recommends that you schedule a follow-up appointment in: 3 weeks with Dr Claiborne Billings.

## 2016-07-22 DIAGNOSIS — D649 Anemia, unspecified: Secondary | ICD-10-CM | POA: Diagnosis not present

## 2016-07-22 NOTE — Progress Notes (Signed)
Patient ID: [name], male   DOB: 02/09/1946, 71 y.o.   MRN: [id number]    Primary MD:  Dr. Garwin Brothers  HPI: [name] is a 71 y.o. male who is a former patient of Dr. Rollene Fare.  He presents for a 6 month follow-up cardiology evaluation.  [name] has an extensive cardiac and peripheral vascular history. He underwent initial CABG revascularization surgery in [place] in 1984. In 1996 he required re-do CABG revascularization surgery after all grafts were found to be occluded. At that time he also underwent right carotid endarterectomy. In 1998 he underwent stenting of his left main coronary artery and also had stenting done to the vein graft supplying the marginal vessel. He has history of right renal artery stenosis and underwent initial stenting with predilatation 2005. He has documented chronic right bundle branch block. He also significant peripheral vascular disease with bilateral SFA occlusion. He has known left subclavian artery occlusion with right subclavian stenosis. Has a history of hypertension, hyperlipidemia, and remotely smoked cigarettes. His last catheterization was done in November 2012 which showed significant native CAD with evidence for patent left main stent. He had an occluded circumflex marginal vessel from the AV groove circumflex. There was 80% ostial RCA stenosis with 90% stenosis in the mid right coronary artery proximal to the RV marginal branch and total occlusion of the mid RCA. An occluded vein graft supplying the right coronary artery but evidence for collateralization to the distal right artery the left coronary injection and injection of the grafts. The vein supplying the marginal vessel with the previously placed ostial proximal Taxus stent had in-stent narrowing of 50% followed by 95% stenosis in the body of the proximal third of the vessel graft prior to the previously placed mid grafts 3.0x20 mm Taxus stent. At that time, he underwent difficult but  successful intervention involving long segmental stenosis of the vein graft supplying the circumflex marginal vessel. Additional tandem 3.0x38 mm and 3.0x8 mm Taxus stents were inserted with excellent angiographic result.   He saw Dr. Rollene Fare in August 2014 per at that time due to concern for progressive carotid stenoses repeat Doppler studies were done and he also saw Dr. Sherren Mocha early for followup evaluation. Repeat imaging showed a patent right carotid endarterectomy site the Doppler velocity suggestive of 40  percent stenosis of the bilateral proximal internal carotid arteries. There was left subclavian steal noted with retrograde left vertebral artery flow, monophasic left subclavian artery flow and significant difference in the bilateral brachial pressures.  He established cardiology care with me in December 2014 December, he has continued to remain stable from a cardiac standpoint.  He is participating in cardiac rehabilitation at [facility] on Monday, Tuesday, and Wednesdays, and has been doing so for 10 years.  A nuclear perfusion study in May 2015 demonstrated mild inferobasal ischemia, most likely secondary to RCA distribution.    On 03/25/2014 he was seen by Dr. Martinique after he developed an episode of atrial fibrillation the day before.  An ECG in Dr. Latina Craver  office showed atrial fibrillation with rate of 120.  His atenolol was increased to 50 mg twice a day, and he converted to sinus rhythm after 18 hours.  When he saw Dr. Martinique the following day he was in sinus rhythm.  On the increased dose of beta blocker he denies recurrent palpitations.  He tells me he recently underwent a colonoscopy and had 3 polyps removed, but also required cauterization for another site  that was oozing blood.  This reason, he was not restarted on anticoagulation but has been maintained on low-dose aspirin and Plavix.  He has seenDr. Gwenlyn Found in follow-up of his PVD and is status post left renal stenting.  He also  is followed by Dr. Donnetta Hutching for his carotid disease.  In August 2017 he was seen by Kerin Ransom after he was found to be in atrial fibrillation 1 week previously at cardiac rehabilitation.  He was started on amiodarone 20 mg twice a day for 1 week and then 200 mg daily and his atenolol was reduced to 25 mg twice a day.  He was seen several weeks later on 10/29/2015 by Lesia Hausen and was back in sinus rhythm.  He underwent carotid duplex imaging on 12/23/2015.  The right carotid endarterectomy site had Doppler velocities suggestive of a 60-79% stenosis. The left internal carotid artery velocity suggested a 40-59% stenoses.  Renal duplex imaging to just a patent right renal artery stent.  There was stable greater than 60% left renal artery stenosis.    When I last saw him, since he was on amiodarone, I recommended he reduce lovastatin to 20 mg and added Zetia for continued aggressive lipid management.  He had progressive lower extremity edema and recommended he increase his torsemide.  Apparently, in March she developed episodes of bronchitis.  Since that time his rhythm has been irregular.  He wore a Holter monitor which showed atrial fibrillation with a minimum rate at 52 at 4:02 AM was sleeping and maximum rate at 119.  In the afternoon with an average rate of 82 bpm.  There are frequent PVCs, rare couplets, occasional episodes of bigeminy and trigeminy but no episodes of VT.  There were no pauses.  Previously, his blood pressure had become low and he's not been on losartan for the past 10 days.  He admits to leg swelling, left greater than right.  He had a chest x-ray with Dr. Reesa Chew.  On 06/21/2016.  He had a follow-up carotid Doppler study, which actually showed improvement in his velocities such that his right carotid endarterectomy was patent with velocities now suggestive of 40-59% stenosis.  The left internal carotid velocities suggested a 1-39% stenosis.  He presents for cardiology follow-up.  Past  Medical History:  Diagnosis Date  . Anemia   . Angina   . Atrial fibrillation ([place])   . Blood transfusion   . Bronchitis   . Bulging discs    "8 of them; thoracic, lumbar, sacral area"  . Chronic back pain   . Colon polyps   . Complication of anesthesia   . Coronary artery disease 01/24/11   Successful PCI long segmental stensois  vein graft to the CX marginal vessel-graft previously stented prox. 3.0x74m TAXUS stent mid seg 3.0x243mTAXUS stent now tandem stens of 3.0x3850m 3.0x8mm16maced with the seg. 50,80 & 99% stenosis reduced to 0%  . DDD (degenerative disc disease)   . Dysrhythmia    "PAC's and PVC's"  . Esophageal dilatation    "have had it done 7 times; last time ~ 2001"  . Esophageal stricture   . GERD (gastroesophageal reflux disease)   . Headache(784.0)   . Hiatal hernia   . Hyperlipidemia   . My[facility] ([facility]91  [facility]   "had 3 heart attacks this year"  . Paroxysmal atrial fibrillation (HCC)   . Pyloric stenosis   . RBBB   . Renal artery stenosis (HCC)[place]  hhistory of right renal artery stenting and re-intervention for "in-stent restenosis.  . S/P CABG (coronary artery bypass graft) [place]  . Shortness of breath    "when I had the heart attacks"  . Subclavian artery stenosis, left ([place])   . Ulcer    "small; Dr. Henrene Pastor found it 02/2010"    Past Surgical History:  Procedure Laterality Date  . ADENOIDECTOMY     "when I was an infant"  . APPENDECTOMY  1953  . CAROTID ENDARTERECTOMY Right 1996   CE  . CORONARY ANGIOPLASTY WITH STENT PLACEMENT  01/24/11   "today makes a total of 9 stents"  . CORONARY ARTERY BYPASS GRAFT  1984   CABG X 4  . CORONARY ARTERY BYPASS GRAFT  1996   CABG X 4  . LEFT HEART CATHETERIZATION WITH CORONARY/GRAFT ANGIOGRAM N/A 01/24/2011   Procedure: LEFT HEART CATHETERIZATION WITH Beatrix Fetters;  Surgeon: Troy Sine, MD;  Location: [facility]  CATH LAB;  Service: Cardiovascular;  Laterality: N/A;  . pylondial cyst removal    . RETINAL DETACHMENT SURGERY  04/2007   right  . TONSILLECTOMY  1972    Allergies  Allergen Reactions  . Codeine Nausea And Vomiting  . Sulfonamide Derivatives Other (See Comments)    REACTION: rash    Current Outpatient Prescriptions  Medication Sig Dispense Refill  . amiodarone (PACERONE) 200 MG tablet Take 1 tablet (200 mg total) by mouth 2 (two) times daily. 180 tablet 3  . atenolol (TENORMIN) 50 MG tablet Take 0.5 tablets (25 mg total) by mouth daily. 225 tablet 0  . butalbital-aspirin-caffeine (FIORINAL) 50-325-40 MG per capsule Take 1 capsule by mouth every 4 (four) hours as needed. FOR HEADACHE.     Marland Kitchen Coenzyme Q10 (CO Q-10) 100 MG CAPS Take 1 tablet by mouth daily.     Marland Kitchen dutasteride (AVODART) 0.5 MG capsule Take 0.5 mg by mouth daily.     Marland Kitchen esomeprazole (NEXIUM) 40 MG capsule TAKE ONE CAPSULE BY MOUTH TWICE DAILY 180 capsule 0  . ezetimibe (ZETIA) 10 MG tablet Take 1 tablet (10 mg total) by mouth daily. 90 tablet 3  . loratadine (CLARITIN) 10 MG tablet Take 1 tablet by mouth daily as needed for allergies, rhinitis or itching.   3  . losartan (COZAAR) 50 MG tablet TAKE 1 TABLET (50 MG TOTAL) BY MOUTH DAILY. 90 tablet 2  . lovastatin (MEVACOR) 40 MG tablet Take 0.5 tablets (20 mg total) by mouth at bedtime. 90 tablet 2  . Multiple Vitamins-Minerals (MULTIVITAMINS THER. W/MINERALS) TABS Take 1 tablet by mouth daily.      . nitroGLYCERIN (NITROLINGUAL) 0.4 MG/SPRAY spray Place 1 spray under the tongue every 5 (five) minutes x 3 doses as needed for chest pain. 0.4 g 3  . ranolazine (RANEXA) 1000 MG SR tablet Take 1/2 tablet twice a day 90 tablet 2  . rOPINIRole (REQUIP) 0.5 MG tablet TAKE 1 TABLET (0.5 MG) BY ORAL ROUTE 1-3 HOURS BEFORE BEDTIME AND IN AM  2  . torsemide (DEMADEX) 20 MG tablet Take 20 mg by mouth every other day.    . vitamin E 200 UNIT capsule Take 200 Units by mouth daily.      Marland Kitchen  apixaban (ELIQUIS) 5 MG TABS tablet Take 1 tablet (5 mg total) by mouth 2 (two) times daily. 60 tablet 3   No current facility-administered medications for this visit.     Social History   Social History  .  Marital status: Married    Spouse name: N/A  . Number of children: N/A  . Years of education: N/A   Occupational History  . Not on file.   Social History Main Topics  . Smoking status: Former Smoker    Packs/day: 1.00    Years: 38.00    Types: Cigarettes    Quit date: 02/09/2005  . Smokeless tobacco: Never Used     Comment: "quit smoking cigarrettes 2006"  . Alcohol use No  . Drug use: No  . Sexual activity: Not on file   Other Topics Concern  . Not on file   Social History Narrative  . No narrative on file   Socially he is married has 2 children 2 stepchildren. He quit tobacco in 2000.  Family History  Problem Relation Age of Onset  . Breast cancer Paternal Aunt   . Diabetes Sister   . Ulcers Father   . Heart disease Father   . Heart failure Father   . Heart attack Father   . AAA (abdominal aortic aneurysm) Father   . Stroke Mother   . Hypertension Mother   . 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 Henrene Pastor. GU: Occasional concentrated or  dark urine without blood Musculoskeletal: Negative; no myalgias, joint pain, or weakness Hematologic/Oncology: Negative; no easy bruising, bleeding Endocrine: Negative; no heat/cold intolerance; no diabetes Neuro: Negative; no changes in balance, headaches Skin: Negative; No rashes or skin lesions Psychiatric: Negative; No behavioral problems, depression Sleep: Negative; No snoring, daytime  sleepiness, hypersomnolence, bruxism, restless legs, hypnogognic hallucinations, no cataplexy Other comprehensive 14 point system review is negative.   PE BP 106/68   Pulse 71   Ht 5' 5.5" (1.664 m)   Wt 143 lb (64.9 kg)   BMI 23.43 kg/m    Repeat BP 110/66  Wt Readings from Last 3 Encounters:  07/21/16 143 lb (64.9 kg)  06/21/16 148 lb (67.1 kg)  02/02/16 146 lb 12.8 oz (66.6 kg)   General: Alert, oriented, no distress.  Skin: normal turgor, no rashes, warm and dry HEENT: Normocephalic, atraumatic. Pupils equal round and reactive to light; sclera anicteric; extraocular muscles intact;  Nose without nasal septal hypertrophy Mouth/Parynx benign; Mallinpatti scale 3 Neck: No JVD, no carotid bruits; normal carotid upstroke Lungs: clear to ausculatation and percussion; no wheezing or rales Chest wall: without tenderness to palpitation Heart: Irregularly irregular rhythm with ventricular rate in the 70s.  s1 s2 normal, 2/6 systolic murmur, no diastolic murmur, no rubs, gallops, thrills, or heaves Abdomen: soft, nontender; no hepatosplenomehaly, BS+; abdominal aorta nontender and not dilated by palpation. Back: no CVA tenderness Pulses 2+ Musculoskeletal: full range of motion, normal strength, no joint deformities Extremities: 2+ left lower extremity edema and 1+ right lower extremity edema; no clubbing cyanosis , Homan's sign negative  Neurologic: grossly nonfocal; Cranial nerves grossly wnl Psychologic: Normal mood and affect   ECG (independently read by me): atrial fibrillation with a controlled ventricular rate at 71 bpm.  Right bundle branch block and left posterior hemiblock.  Small inferior Q waves.  November 2017 ECG (independently read by me): Sinus rhythm with a PVC.  Right bundle branch block and probable left posterior  hemiblock.  Inferior Q waves with preserved R waves.  February 2017 ECG (independently read by me): normal sinus rhythm at 61 bpm. Probable left atrial  enlargement. Right bundle branch block.  ECG (independently read by me): Sinus bradycardia 58 bpm.  Increased QTc interval 473 ms.  Right bundle-branch block.  May 2016 ECG (independently read by me): Sinus bradycardia 59 bpm with right bundle branch block.I ST-T changes.  QTc interval 475 ms.  PR interval 182 ms.  No ectopy.  February 2016 ECG (independently read by me) normal sinus rhythm at 63 bpm.  Right bundle-branch block.  Probable left posterior fascicular block.  PR interval 170 ms.  QTC interval 480 ms.  November 2015 ECG (independently read by me): Normal sinus rhythm at 62 bpm.  Right bundle branch block.  Small nondiagnostic inferior Q waves.  Prior May 2015ECG : sinus rhythm at 58 beats per minute.  Right bundle branch block.  Prior December 2014 ECG: Sinus rhythm with sinus bradycardia 55 beats per minute. Right bundle branch block.  LABS: BMP Latest Ref Rng & Units 10/14/2015 11/25/2014 08/01/2014  Glucose 65 - 99 mg/dL 96 89 96  BUN 7 - 25 mg/dL 26(H) 26(H) 26(H)  Creatinine 0.70 - 1.18 mg/dL 1.12 1.06 1.21  Sodium 135 - 146 mmol/L 140 138 140  Potassium 3.5 - 5.3 mmol/L 5.1 4.8 4.6  Chloride 98 - 110 mmol/L 110 106 108  CO2 20 - 31 mmol/L _0 Calcium 8.6 - 10.3 mg/dL 8.7 8.7 9.2   Hepatic Function Latest Ref Rng & Units 10/14/2015 11/25/2014 08/01/2014  Total Protein 6.1 - 8.1 g/dL 5.8(L) 6.0(L) 6.2  Albumin 3.6 - 5.1 g/dL 3.7 3.9 3.9  AST 10 - 35 U/L _1 ALT 9 - 46 U/L _2 Alk Phosphatase 40 - 115 U/L 64 75 69  Total Bilirubin 0.2 - 1.2 mg/dL 0.4 0.3 0.4   CBC Latest Ref Rng & Units 11/25/2014 01/25/2011 01/25/2011  WBC 4.0 - 10.5 K/uL 7.1 7.4 -  Hemoglobin 13.0 - 17.0 g/dL 13.1 9.8(L) 10.1(L)  Hematocrit 39.0 - 52.0 % 38.3(L) 28.0(L) 28.5(L)  Platelets 150 - 400 K/uL 188 120(L) -   Lab Results  Component Value Date   MCV 97.0 11/25/2014   MCV 94.0 01/25/2011   MCV 93.2 01/25/2011   Lab Results  Component Value Date   TSH 0.83 10/14/2015      Lipid Panel     Component Value Date/Time   CHOL 172 08/01/2014 0852   TRIG 90 08/01/2014 0852   HDL 66 08/01/2014 0852   CHOLHDL 2.6 08/01/2014 0852   VLDL 18 08/01/2014 0852   LDLCALC 88 08/01/2014 0852     RADIOLOGY: No results found.  IMPRESSION:  1. Persistent atrial fibrillation ([place])   2. RBBB (right bundle branch block with left anterior fascicular block)   3. Essential hypertension   4. PVD (peripheral vascular disease) (HCC)   5. Carotid stenosis, bilateral   6. Lower extremity edema   7. Alteration in anticoagulation     ASSESSMENT AND PLAN: Mr. Ashawn Rinehart is a 71 year old gentleman who has an extensive CAD/PVD history, and is 34 years status post his initial CABG revascularization surgery done in [place] in 1984 and 22 years status post his second CABG surgery in 1996. He has had multiple peripheral vascular procedures including carotid endarterectomy, renal stenting, and is documented SFA occlusion. His last catheterization intervention in 2012 demonstrated diffuse disease in the vein  graft supplying the marginal vessel and 2 additional Taxus stents inserted commencing ostially and extending to the proximal portion of the recently placed mid prior stent. He has a known occluded graft to the RCA with a mid RCA occlusion with left to right collaterals. He also has subclavian steal. He does have mild hypokinesis inferolaterally in the basal inferior wall with an EF of 45% at that catheterization. He has chronic right bundle branch block. He has not been demonstrated to have an abdominal aortic aneurysm. When I had seen him previously he had complained of mild chest anginal symptomatology typically when he starts exercise and particularly after eating. He had potential areas of myocardium which may be responsible for ischemia.  At that time, I added  Ranexa 500 mg twice a day to his medical regimen and subsequently this was further titrated to 1000 mg twice a day , which has  resolved his symptomatology.  In January 2016 he was documented to have paroxysmal atrial fibrillation which responded to increased beta blocker therapy and lasted for ~18 hours.  Because of GI issues, he is not on full dose anticoagulation. And has been on aspirin and Plavix.  He was found to have recurrent atrial fibrillation in August 2017 which responded to amiodarone therapy.  When I last saw him he was in sinus rhythm.  Apparently since developing bronchitis in March.  His rhythm has been abnormal.  A Holter monitor study showed a predominant atrial fibrillation rhythm with frequent PVCs, rare couplets, occasional bigeminy and trigeminy but no episodes of DVT.  He has been off losartan for 10 days due to low blood pressure.  His blood pressure today is low normal and stable.  He continues to be on torsemide 20 mg every other day, Ranexa 1001 g twice a day, atenolol 25 mg twice a day and is on lovastatin 20 mg and Zetia 10 mg for his lipids.  With his recurrent AF.  I'm increasing amiodarone from 2 mg daily up to 200 mg twice a day.  He will reduce his atenolol to 25 mg daily.  With his persistent AF.  I have recommended discontinuance of Plavix and will start him on eloquence 5 mg twice a day.  He will also hold aspirin with his GI history.  Since I am increasing his amiodarone dose I will decrease Ranexa to 500 mg twice a day.  He had a Doppler study that was negative for DVT ordered by Dr.Amin 2 weeks ago due to his leg swelling.  I have recommended support stockings at 20-30 mm pressure.  I will see him in 3 weeks for reevaluation. Time spent: 25 minutes  Troy Sine, MD, [facility]  07/23/2016 10:38 PM

## 2016-07-29 DIAGNOSIS — D649 Anemia, unspecified: Secondary | ICD-10-CM | POA: Diagnosis not present

## 2016-08-01 NOTE — Telephone Encounter (Signed)
As was reviewed and discussed with patient

## 2016-08-11 ENCOUNTER — Ambulatory Visit (INDEPENDENT_AMBULATORY_CARE_PROVIDER_SITE_OTHER): Payer: Medicare Other | Admitting: Cardiovascular Disease

## 2016-08-11 ENCOUNTER — Encounter: Payer: Self-pay | Admitting: Cardiovascular Disease

## 2016-08-11 VITALS — BP 128/52 | HR 70 | Ht 65.5 in | Wt 147.8 lb

## 2016-08-11 DIAGNOSIS — I10 Essential (primary) hypertension: Secondary | ICD-10-CM

## 2016-08-11 DIAGNOSIS — R600 Localized edema: Secondary | ICD-10-CM

## 2016-08-11 DIAGNOSIS — I481 Persistent atrial fibrillation: Secondary | ICD-10-CM | POA: Diagnosis not present

## 2016-08-11 DIAGNOSIS — I2510 Atherosclerotic heart disease of native coronary artery without angina pectoris: Secondary | ICD-10-CM | POA: Diagnosis not present

## 2016-08-11 DIAGNOSIS — Z7901 Long term (current) use of anticoagulants: Secondary | ICD-10-CM

## 2016-08-11 DIAGNOSIS — I4819 Other persistent atrial fibrillation: Secondary | ICD-10-CM

## 2016-08-11 DIAGNOSIS — I452 Bifascicular block: Secondary | ICD-10-CM

## 2016-08-11 DIAGNOSIS — I6523 Occlusion and stenosis of bilateral carotid arteries: Secondary | ICD-10-CM

## 2016-08-11 NOTE — Patient Instructions (Addendum)
Your physician has requested that you have an echocardiogram. Echocardiography is a painless test that uses sound waves to create images of your heart. It provides your doctor with information about the size and shape of your heart and how well your heart's chambers and valves are working. This procedure takes approximately one hour. There are no restrictions for this procedure.  Your physician recommends that you schedule a follow-up appointment in: 4 weeks.

## 2016-08-11 NOTE — Progress Notes (Signed)
Patient ID: [name], male   DOB: 1945/05/31, 71 y.o.   MRN: [id number]    Primary MD:  Dr. Garwin Brothers  HPI: [name] is a 71 y.o. male who is a former patient of [name].  He presents for a one month follow-up cardiology evaluation.  [name] has an extensive cardiac and peripheral vascular history. He underwent initial CABG revascularization surgery in [place] in 1984. In 1996 he required re-do CABG revascularization surgery after all grafts were [name] to be occluded. At that time he also underwent right carotid endarterectomy. In 1998 he underwent stenting of his left main coronary artery and also had stenting done to the vein graft supplying the marginal vessel. He has history of right renal artery stenosis and underwent initial stenting with predilatation 2005. He has documented chronic right bundle branch block. He also significant peripheral vascular disease with bilateral SFA occlusion. He has known left subclavian artery occlusion with right subclavian stenosis. Has a history of hypertension, hyperlipidemia, and remotely smoked cigarettes. His last catheterization was done in November 2012 which showed significant native CAD with evidence for patent left main stent. He had an occluded circumflex marginal vessel from the AV groove circumflex. There was 80% ostial RCA stenosis with 90% stenosis in the mid right coronary artery proximal to the RV marginal branch and total occlusion of the mid RCA. An occluded vein graft supplying the right coronary artery but evidence for collateralization to the distal right artery the left coronary injection and injection of the grafts. The vein supplying the marginal vessel with the previously placed ostial proximal Taxus stent had in-stent narrowing of 50% followed by 95% stenosis in the body of the proximal third of the vessel graft prior to the previously placed mid grafts 3.0x20 mm Taxus stent. At that time, he underwent difficult but  successful intervention involving long segmental stenosis of the vein graft supplying the circumflex marginal vessel. Additional tandem 3.0x38 mm and 3.0x8 mm Taxus stents were inserted with excellent angiographic result.   He saw [name] in August 2014 per at that time due to concern for progressive carotid stenoses repeat Doppler studies were done and he also saw [name] early for followup evaluation. Repeat imaging showed a patent right carotid endarterectomy site the Doppler velocity suggestive of 40  percent stenosis of the bilateral proximal internal carotid arteries. There was left subclavian steal noted with retrograde left vertebral artery flow, monophasic left subclavian artery flow and significant difference in the bilateral brachial pressures.  He established cardiology care with me in December 2014.  A nuclear perfusion study in May 2015 demonstrated mild inferobasal ischemia, most likely secondary to RCA distribution.    On 03/25/2014 he was seen by [name] after he developed an episode of atrial fibrillation the day before.  An ECG in [name]  office showed atrial fibrillation with rate of 120.  His atenolol was increased to 50 mg twice a day, and he converted to sinus rhythm after 18 hours.  When he saw [name] the following day he was in sinus rhythm.  On the increased dose of beta blocker he denies recurrent palpitations.  He tells me he recently underwent a colonoscopy and had 3 polyps removed, but also required cauterization for another site that was oozing blood.  This reason, he was not restarted on anticoagulation but has been maintained on low-dose aspirin and Plavix.  He has seen [name] in follow-up of his PVD and  is status post left renal stenting.  He also is followed by [name] for his carotid disease.  In August 2017 he was seen by [name] after he was [name] to be in atrial fibrillation 1 week previously at cardiac rehabilitation.  He was started on  amiodarone 200 mg twice a day for 1 week and then 200 mg daily and his atenolol was reduced to 25 mg twice a day.  He was seen several weeks later on 10/29/2015 by [name] and was back in sinus rhythm.  He underwent carotid duplex imaging on 12/23/2015.  The right carotid endarterectomy site had Doppler velocities suggestive of a 60-79% stenosis. The left internal carotid artery velocity suggested a 40-59% stenoses.  Renal duplex imaging to just a patent right renal artery stent.  There was stable greater than 60% left renal artery stenosis.    When I  saw him in November 2017 he was maintaining sinus rhythm and  since he was on amiodarone, I recommended he reduce lovastatin to 20 mg and added Zetia for continued aggressive lipid management.  He had progressive lower extremity edema and recommended he increase his torsemide.  In March 2018 he developed episodes of bronchitis.  Since that time his rhythm has been irregular.  He wore a Holter monitor which showed atrial fibrillation with a minimum rate at 52 at 4:02 AM was sleeping and maximum rate at 119.  In the afternoon with an average rate of 82 bpm.  There are frequent PVCs, rare couplets, occasional episodes of bigeminy and trigeminy but no episodes of VT.  There were no pauses.  Previously, his blood pressure had become low and hehad been on losartan and I saw him one month ago there was leg swelling, left greater than right.  He had a chest x-ray with Dr. Reesa [name] on 06/21/2016.  A  follow-up carotid Doppler study in April 2018 showed improvement in his velocities such that his right carotid endarterectomy was patent with velocities now suggestive of 40-59% stenosis.  The left internal carotid velocities suggested a 1-39% stenosis.   When I saw him one month ago, he was in atrial fibrillation.  I discontinued Plavix and started him on eloquence 5 mg twice a day.  I recommended not to take aspirin with his GI history.  I increased amiodarone to 200 mg  twice a day and reduced his Ranexa dose to 500 mg twice a day with concomitant therapy.  He was having lower extremity edema.  A Doppler study was negative for DVT by his primary physician.  I recommended support stockings.  He presents now for follow-up evaluation.  Past Medical History:  Diagnosis Date  . Anemia   . Angina   . Atrial fibrillation ([place])   . Blood transfusion   . Bronchitis   . Bulging discs    "8 of them; thoracic, lumbar, sacral area"  . Chronic back pain   . Colon polyps   . Complication of anesthesia   . Coronary artery disease 01/24/11   Successful PCI long segmental stensois  vein graft to the CX marginal vessel-graft previously stented prox. 3.0x55m TAXUS stent mid seg 3.0x231mTAXUS stent now tandem stens of 3.0x3819m 3.0x8mm31maced with the seg. 50,80 & 99% stenosis reduced to 0%  . DDD (degenerative disc disease)   . Dysrhythmia    "PAC'[name] and PVC'[name]"  . Esophageal dilatation    "have had it done 7 times; last time ~ 2001"  . Esophageal stricture   .  GERD (gastroesophageal reflux disease)   . Headache(784.0)   . Hiatal hernia   . Hyperlipidemia   . Myocardial infarction ([place]) 1991  . Myocardial infarction [facility]) 2005   "had 3 heart attacks this year"  . Paroxysmal atrial fibrillation ([facility])   . Pyloric stenosis   . RBBB   . Renal artery stenosis ([facility])    hhistory of right renal artery stenting and re-intervention for "in-stent restenosis.  . [name]/P CABG (coronary artery bypass graft) [place]  . Shortness of breath    "when I had the heart attacks"  . Subclavian artery stenosis, left ([place])   . Ulcer    "small; [name] [name] it 02/2010"    Past Surgical History:  Procedure Laterality Date  . ADENOIDECTOMY     "when I was an infant"  . APPENDECTOMY  1953  . CAROTID ENDARTERECTOMY Right 1996   CE  . CORONARY ANGIOPLASTY WITH STENT PLACEMENT  01/24/11   "today makes a total of 9 stents"  . CORONARY ARTERY  BYPASS GRAFT  1984   CABG X 4  . CORONARY ARTERY BYPASS GRAFT  1996   CABG X 4  . LEFT HEART CATHETERIZATION WITH CORONARY/GRAFT ANGIOGRAM N/A 01/24/2011   Procedure: LEFT HEART CATHETERIZATION WITH Beatrix Fetters;  Surgeon: [name] Sine, MD;  Location: [facility] CATH LAB;  Service: Cardiovascular;  Laterality: N/A;  . pylondial cyst removal    . RETINAL DETACHMENT SURGERY  04/2007   right  . TONSILLECTOMY  1972    Allergies  Allergen Reactions  . Codeine Nausea And Vomiting  . Sulfonamide Derivatives Other (See Comments)    REACTION: rash    Current Outpatient Prescriptions  Medication Sig Dispense Refill  . amiodarone (PACERONE) 200 MG tablet Take 1 tablet (200 mg total) by mouth 2 (two) times daily. 180 tablet 3  . apixaban (ELIQUIS) 5 MG TABS tablet Take 1 tablet (5 mg total) by mouth 2 (two) times daily. 60 tablet 3  . atenolol (TENORMIN) 50 MG tablet Take 0.5 tablets (25 mg total) by mouth daily. 225 tablet 0  . butalbital-aspirin-caffeine (FIORINAL) 50-325-40 MG per capsule Take 1 capsule by mouth every 4 (four) hours as needed. FOR HEADACHE.     Marland Kitchen Coenzyme Q10 (CO Q-10) 100 MG CAPS Take 1 tablet by mouth daily.     Marland Kitchen dutasteride (AVODART) 0.5 MG capsule Take 0.5 mg by mouth daily.     Marland Kitchen esomeprazole (NEXIUM) 40 MG capsule TAKE ONE CAPSULE BY MOUTH TWICE DAILY 180 capsule 0  . loratadine (CLARITIN) 10 MG tablet Take 1 tablet by mouth daily as needed for allergies, rhinitis or itching.   3  . losartan (COZAAR) 50 MG tablet TAKE 1 TABLET (50 MG TOTAL) BY MOUTH DAILY. 90 tablet 2  . lovastatin (MEVACOR) 40 MG tablet Take 0.5 tablets (20 mg total) by mouth at bedtime. 90 tablet 2  . Multiple Vitamins-Minerals (MULTIVITAMINS THER. W/MINERALS) TABS Take 1 tablet by mouth daily.      . nitroGLYCERIN (NITROLINGUAL) 0.4 MG/SPRAY spray Place 1 spray under the tongue every 5 (five) minutes x 3 doses as needed for chest pain. 0.4 g 3  . ranolazine (RANEXA) 1000 MG SR tablet Take 1/2  tablet twice a day 90 tablet 2  . rOPINIRole (REQUIP) 0.5 MG tablet TAKE 1 TABLET (0.5 MG) BY ORAL ROUTE 1-3 HOURS BEFORE BEDTIME AND IN AM  2  . torsemide (DEMADEX) 20 MG tablet Take 20 mg  by mouth every other day.    . vitamin E 200 UNIT capsule Take 200 Units by mouth daily.      Marland Kitchen ezetimibe (ZETIA) 10 MG tablet Take 1 tablet by mouth daily.  3   No current facility-administered medications for this visit.     Social History   Social History  . Marital status: Married    Spouse name: N/A  . Number of children: N/A  . Years of education: N/A   Occupational History  . Not on file.   Social History Main Topics  . Smoking status: Former Smoker    Packs/day: 1.00    Years: 38.00    Types: Cigarettes    Quit date: 02/09/2005  . Smokeless tobacco: Never Used     Comment: "quit smoking cigarrettes 2006"  . Alcohol use No  . Drug use: No  . Sexual activity: Not on file   Other Topics Concern  . Not on file   Social History Narrative  . No narrative on file   Socially he is married has 2 children 2 stepchildren. He quit tobacco in 2000.  Family History  Problem Relation Age of Onset  . Breast cancer Paternal Aunt   . Diabetes Sister   . Ulcers Father   . Heart disease Father   . Heart failure Father   . Heart attack Father   . AAA (abdominal aortic aneurysm) Father   . Stroke Mother   . Hypertension Mother   . Colon cancer Neg Hx    ROS General: Negative; No fevers, chills, or night sweats;  HEENT: History of a detached retina in his right eye; No changes in  hearing, sinus congestion, difficulty swallowing Pulmonary: Negative; No cough, wheezing, shortness of breath, hemoptysis Cardiovascular: see HPI;  GI: [name]/p polypectomy and rare red blood per rectum followed by [name]. GU: Occasional concentrated or  dark urine without blood Musculoskeletal: Negative; no myalgias, joint pain, or weakness Hematologic/Oncology: Negative; no easy bruising, bleeding Endocrine:  Negative; no heat/cold intolerance; no diabetes Neuro: Negative; no changes in balance, headaches Skin: Negative; No rashes or skin lesions Psychiatric: Negative; No behavioral problems, depression Sleep: Negative; No snoring, daytime sleepiness, hypersomnolence, bruxism, restless legs, hypnogognic hallucinations, no cataplexy Other comprehensive 14 point system review is negative.   PE BP (!) 128/52   Pulse 70   Ht 5' 5.5" (1.664 m)   Wt 147 lb 12.8 oz (67 kg)   BMI 24.22 kg/m    Repeat BP 135/70  Wt Readings from Last 3 Encounters:  08/11/16 147 lb 12.8 oz (67 kg)  07/21/16 143 lb (64.9 kg)  06/21/16 148 lb (67.1 kg)    General: Alert, oriented, no distress.  Skin: normal turgor, no rashes, warm and dry HEENT: Normocephalic, atraumatic. Pupils equal round and reactive to light; sclera anicteric; extraocular muscles intact;  Nose without nasal septal hypertrophy Mouth/Parynx benign; Mallinpatti scale 3 Neck: No JVD, no carotid bruits; normal carotid upstroke Lungs: clear to ausculatation and percussion; no wheezing or rales Chest wall: without tenderness to palpitation Heart: PMI not displaced; irregularly irregular rhythm with rate in the 70s, s1 s2 normal, 1/6 systolic murmur, no diastolic murmur, no rubs, gallops, thrills, or heaves Abdomen: soft, nontender; no hepatosplenomehaly, BS+; abdominal aorta nontender and not dilated by palpation. Back: no CVA tenderness Pulses 2+ Musculoskeletal: full range of motion, normal strength, no joint deformities Extremities: 1.  A 2+ edema in the left lower leg and trace in the right.  no clubbing cyanosis  or Homan'[name] sign negative  Neurologic: grossly nonfocal; Cranial nerves grossly wnl Psychologic: Normal mood and affect   ECG (independently read by me): Atrial fibrillation with ventricular rate at 70 bpm.  Right bundle-branch block with repolarization changes.  Probable left posterior fascicular block.  07/21/2016 ECG  (independently read by me): atrial fibrillation with a controlled ventricular rate at 71 bpm.  Right bundle branch block and left posterior hemiblock.  Small inferior Q waves.  November 2017 ECG (independently read by me): Sinus rhythm with a PVC.  Right bundle branch block and probable left posterior hemiblock.  Inferior Q waves with preserved R waves.  February 2017 ECG (independently read by me): normal sinus rhythm at 61 bpm. Probable left atrial enlargement. Right bundle branch block.  ECG (independently read by me): Sinus bradycardia 58 bpm.  Increased QTc interval 473 ms.  Right bundle-branch block.  May 2016 ECG (independently read by me): Sinus bradycardia 59 bpm with right bundle branch block.I ST-T changes.  QTc interval 475 ms.  PR interval 182 ms.  No ectopy.  February 2016 ECG (independently read by me) normal sinus rhythm at 63 bpm.  Right bundle-branch block.  Probable left posterior fascicular block.  PR interval 170 ms.  QTC interval 480 ms.  November 2015 ECG (independently read by me): Normal sinus rhythm at 62 bpm.  Right bundle branch block.  Small nondiagnostic inferior Q waves.  Prior May 2015ECG : sinus rhythm at 58 beats per minute.  Right bundle branch block.  Prior December 2014 ECG: Sinus rhythm with sinus bradycardia 55 beats per minute. Right bundle branch block.  LABS: BMP Latest Ref Rng & Units 10/14/2015 11/25/2014 08/01/2014  Glucose 65 - 99 mg/dL 96 89 96  BUN 7 - 25 mg/dL 26(H) 26(H) 26(H)  Creatinine 0.70 - 1.18 mg/dL 1.12 1.06 1.21  Sodium 135 - 146 mmol/L 140 138 140  Potassium 3.5 - 5.3 mmol/L 5.1 4.8 4.6  Chloride 98 - 110 mmol/L 110 106 108  CO2 20 - 31 mmol/L _0 Calcium 8.6 - 10.3 mg/dL 8.7 8.7 9.2   Hepatic Function Latest Ref Rng & Units 10/14/2015 11/25/2014 08/01/2014  Total Protein 6.1 - 8.1 g/dL 5.8(L) 6.0(L) 6.2  Albumin 3.6 - 5.1 g/dL 3.7 3.9 3.9  AST 10 - 35 U/L _1 ALT 9 - 46 U/L _2 Alk Phosphatase 40 - 115 U/L 64 75  69  Total Bilirubin 0.2 - 1.2 mg/dL 0.4 0.3 0.4   CBC Latest Ref Rng & Units 11/25/2014 01/25/2011 01/25/2011  WBC 4.0 - 10.5 K/uL 7.1 7.4 -  Hemoglobin 13.0 - 17.0 g/dL 13.1 9.8(L) 10.1(L)  Hematocrit 39.0 - 52.0 % 38.3(L) 28.0(L) 28.5(L)  Platelets 150 - 400 K/uL 188 120(L) -   Lab Results  Component Value Date   MCV 97.0 11/25/2014   MCV 94.0 01/25/2011   MCV 93.2 01/25/2011   Lab Results  Component Value Date   TSH 0.83 10/14/2015    Lipid Panel     Component Value Date/Time   CHOL 172 08/01/2014 0852   TRIG 90 08/01/2014 0852   HDL 66 08/01/2014 0852   CHOLHDL 2.6 08/01/2014 0852   VLDL 18 08/01/2014 0852   LDLCALC 88 08/01/2014 0852     RADIOLOGY: No results [name].  IMPRESSION:  1. Persistent atrial fibrillation ([place])   2. Essential hypertension   3. Lower extremity edema   4. CAD in native artery   5. RBBB (right bundle  branch block with left anterior fascicular block)   6. Chronic anticoagulation   7. Carotid stenosis, bilateral     ASSESSMENT AND PLAN: Mr. Khayman Kirsch is a 71 year old gentleman who has an extensive CAD/PVD history, and is 34 years status post his initial CABG revascularization surgery done in [place] in 1984 and 22 years status post his second CABG surgery in 1996. He has had multiple peripheral vascular procedures including carotid endarterectomy, renal stenting, and is documented SFA occlusion. His last catheterization/ intervention was done in 2012 and demonstrated diffuse disease in the vein graft supplying the marginal vessel and 2 additional Taxus stents inserted commencing ostially and extending to the proximal portion of the recently placed mid prior stent. He has a known occluded graft to the RCA with a mid RCA occlusion with left to right collaterals. He also has subclavian steal. He does have mild hypokinesis inferolaterally in the basal inferior wall with an EF of 45% at that catheterization. He has chronic right bundle branch  block. He has not been demonstrated to have an abdominal aortic aneurysm. When I had seen him previously he had complained of mild chest anginal symptomatology typically when he starts exercise and particularly after eating. He had potential areas of myocardium which may be responsible for ischemia.  At that time, I added  Ranexa 500 mg twice a day to his medical regimen and subsequently this was further titrated to 1000 mg twice a day , which resolved his symptomatology.  In January 2016 he was documented to have paroxysmal atrial fibrillation which responded to increased beta blocker therapy and lasted for ~18 hours.  Because of GI issues, he was not on full dose anticoagulation and has been on aspirin and Plavix.  He was [name] to have recurrent atrial fibrillation in August 2017 which responded to amiodarone therapy.  When I last saw him in November 2017.  He was in sinus rhythm.  Apparently since developing bronchitis in March 2018 he has developed recurrent atrial fibrillation.  A Holter monitor study showed a predominant atrial fibrillation rhythm with frequent PVCs, rare couplets, occasional bigeminy and trigeminy but no episodes of NSVT.  When I last saw him I further increased his amiodarone and reduced his Ranexa to 500 twice a day.  He continues to be angina free.  His ECG today confirms persistent atrial fibrillation.  He has been sleeping in a recliner.  He is unaware of any issues with snoring or waking up gasping for breath and has never been evaluated for sleep apnea.  He states that when he was in sinus rhythm.  He definitely felt better.  We discussed potential DC cardioversion for restoration of sinus rhythm.  He recently developed an episode of shingles and was treated short-term with Valtrex.  He continues to have leg swelling.  I again have recommended compression stockings.  I am recommending a repeat echo Doppler study to reassess his atrial dimensions LV systolic and diastolic function.   Will see him in 4 weeks for reevaluation and if at that time, he still in atrial fibrillation and is does not have a markedly dilated atrium.  We will make plans for outpatient cardioversion.    Time spent: 25 minutes  [name] Sine, MD, [facility]  08/13/2016 2:12 PM

## 2016-08-24 ENCOUNTER — Other Ambulatory Visit: Payer: Self-pay

## 2016-08-24 ENCOUNTER — Ambulatory Visit (HOSPITAL_COMMUNITY): Payer: Medicare Other | Attending: Internal Medicine

## 2016-08-24 DIAGNOSIS — I081 Rheumatic disorders of both mitral and tricuspid valves: Secondary | ICD-10-CM | POA: Insufficient documentation

## 2016-08-24 DIAGNOSIS — I481 Persistent atrial fibrillation: Secondary | ICD-10-CM

## 2016-08-24 DIAGNOSIS — I4819 Other persistent atrial fibrillation: Secondary | ICD-10-CM

## 2016-08-28 ENCOUNTER — Other Ambulatory Visit: Payer: Self-pay | Admitting: Cardiovascular Disease

## 2016-08-29 NOTE — Telephone Encounter (Signed)
REFILL 

## 2016-09-01 ENCOUNTER — Ambulatory Visit (INDEPENDENT_AMBULATORY_CARE_PROVIDER_SITE_OTHER): Payer: Medicare Other | Admitting: Physician Assistant

## 2016-09-01 ENCOUNTER — Encounter: Payer: Self-pay | Admitting: Physician Assistant

## 2016-09-01 VITALS — BP 130/62 | HR 60 | Ht 65.0 in | Wt 148.0 lb

## 2016-09-01 DIAGNOSIS — I25810 Atherosclerosis of coronary artery bypass graft(s) without angina pectoris: Secondary | ICD-10-CM | POA: Diagnosis not present

## 2016-09-01 DIAGNOSIS — R600 Localized edema: Secondary | ICD-10-CM

## 2016-09-01 DIAGNOSIS — I10 Essential (primary) hypertension: Secondary | ICD-10-CM

## 2016-09-01 DIAGNOSIS — I739 Peripheral vascular disease, unspecified: Secondary | ICD-10-CM

## 2016-09-01 DIAGNOSIS — I480 Paroxysmal atrial fibrillation: Secondary | ICD-10-CM | POA: Diagnosis not present

## 2016-09-01 DIAGNOSIS — E785 Hyperlipidemia, unspecified: Secondary | ICD-10-CM | POA: Diagnosis not present

## 2016-09-01 NOTE — Progress Notes (Signed)
Cardiology Office Note    Date:  09/01/2016   ID:  [name], [name] Feb 05, 1946, MRN [id number]  PCP:  Garwin Brothers, MD  Cardiologist:  Dr. Claiborne Billings   Chief Complaint  Patient presents with  . Follow-up    f/u echo. Seen for Dr. Claiborne Billings    History of Present Illness:  [name] is a 71 y.o. male with PMH of HTN, HLD, RBBB, PAD, CAD s/p CABG and afib. He underwent initial CABG in [place] in 1984. He required redo CABG 1996 after grafts were occluded. He underwent R CEA. He underwent stenting of L main and SVG to marginal branch. He had R renal artery stenting with predilatation 2005. He has bilateral SFA occlusion, L subclavian occlusion, and right subclavian stenosis. Cardiac catheterization in November 2012 showed significant native CAD with patent left main stent. Occluded left circumflex vessel. 80% ostial RCA stenosis with 90% stenosis in mid RCA followed by total occlusion. There was occluded SVG to RCA, however collateral arteries to the distal RCA. He had severe stenosis in the marginal branch at the time, this was treated with DES. He had a Myoview in May 2015 demonstrating mildly inferobasal ischemia, most likely secondary to RCA distribution. He was diagnosed with atrial fibrillation in January 2016. Last carotid Doppler was in April 2018 which showed moderate disease on the right, mild disease on the left.  Previously, he was not started on systemic anticoagulation due to removal of polyps on colonoscopy, he was finally started on eliquis on 07/21/2016 after his Plavix was discontinued. His amiodarone was increased to 200 mg twice a day and the Ranexa reduced to 500 mg twice a day. He was evaluated for lower extremity edema, Doppler was negative for DVT. His recent echocardiogram obtained on 08/24/2016 showed EF 55-60%, mild MR, mild TR, PA peak pressure 48 mmHg. Based on previous office note, the plan is to set up for outpatient cardioversion. I obtained a EKG today, this shows patient  is still in atrial fibrillation. I discussed cardioversion with both the patient and his wife, he has not missed any doses of eliquis since initiating the medication on 5/17. He continued to have some degree of fatigue, I am not entirely clear if this is related to atrial fibrillation. Family is concerned the symptom is related to amiodarone. They wish to think about the outpatient cardioversion procedure for now. They are aware that it is option for him to stay atrial fibrillation, however the longer he stays in atrial fibrillation, the less likely he will be able to convert to sinus rhythm again. He will let us know he decides to pursue outpatient cardioversion.    Past Medical History:  Diagnosis Date  . Anemia   . Angina   . Atrial fibrillation ([place])   . Blood transfusion   . Bronchitis   . Bulging discs    "8 of them; thoracic, lumbar, sacral area"  . Chronic back pain   . Colon polyps   . Complication of anesthesia   . Coronary artery disease 01/24/11   Successful PCI long segmental stensois  vein graft to the CX marginal vessel-graft previously stented prox. 3.0x17mm TAXUS stent mid seg 3.0x71mm TAXUS stent now tandem stens of 3.0x78mm & 3.0x46mm placed with the seg. 50,80 & 99% stenosis reduced to 0%  . DDD (degenerative disc disease)   . Dysrhythmia    "PAC's and PVC's"  . Esophageal dilatation    "have had it done 7 times; last time ~  2001"  . Esophageal stricture   . GERD (gastroesophageal reflux disease)   . Headache(784.0)   . Hiatal hernia   . Hyperlipidemia   . Myocardial infarction ([place]) 1991  . Myocardial infarction [facility]) 2005   "had 3 heart attacks this year"  . Paroxysmal atrial fibrillation (HCC)   . Pyloric stenosis   . RBBB   . Renal artery stenosis (HCC)    hhistory of right renal artery stenting and re-intervention for "in-stent restenosis.  . S/P CABG (coronary artery bypass graft) [place]  . Shortness of breath     "when I had the heart attacks"  . Subclavian artery stenosis, left ([place])   . Ulcer    "small; Dr. Henrene Pastor found it 02/2010"    Past Surgical History:  Procedure Laterality Date  . ADENOIDECTOMY     "when I was an infant"  . APPENDECTOMY  1953  . CAROTID ENDARTERECTOMY Right 1996   CE  . CORONARY ANGIOPLASTY WITH STENT PLACEMENT  01/24/11   "today makes a total of 9 stents"  . CORONARY ARTERY BYPASS GRAFT  1984   CABG X 4  . CORONARY ARTERY BYPASS GRAFT  1996   CABG X 4  . LEFT HEART CATHETERIZATION WITH CORONARY/GRAFT ANGIOGRAM N/A 01/24/2011   Procedure: LEFT HEART CATHETERIZATION WITH Beatrix Fetters;  Surgeon: Troy Sine, MD;  Location[facility] CATH LAB;  Service: Cardiovascular;  Laterality: N/A;  . pylondial cyst removal    . RETINAL DETACHMENT SURGERY  04/2007   right  . TONSILLECTOMY  1972    Current Medications: Outpatient Medications Prior to Visit  Medication Sig Dispense Refill  . amiodarone (PACERONE) 200 MG tablet Take 1 tablet (200 mg total) by mouth 2 (two) times daily. 180 tablet 3  . apixaban (ELIQUIS) 5 MG TABS tablet Take 1 tablet (5 mg total) by mouth 2 (two) times daily. 60 tablet 3  . atenolol (TENORMIN) 50 MG tablet TAKE 1 AND 1/2 TABLETS EVERY DAY IN THE MORNING AND TAKE 1 TABLET IN THE EVENING 75 tablet 11  . butalbital-aspirin-caffeine (FIORINAL) 50-325-40 MG per capsule Take 1 capsule by mouth every 4 (four) hours as needed. FOR HEADACHE.     Marland Kitchen Coenzyme Q10 (CO Q-10) 100 MG CAPS Take 1 tablet by mouth daily.     Marland Kitchen dutasteride (AVODART) 0.5 MG capsule Take 0.5 mg by mouth daily.     Marland Kitchen esomeprazole (NEXIUM) 40 MG capsule TAKE ONE CAPSULE BY MOUTH TWICE DAILY 180 capsule 0  . ezetimibe (ZETIA) 10 MG tablet Take 1 tablet by mouth daily.  3  . loratadine (CLARITIN) 10 MG tablet Take 1 tablet by mouth daily as needed for allergies, rhinitis or itching.   3  . losartan (COZAAR) 50 MG tablet TAKE 1 TABLET (50 MG TOTAL) BY MOUTH DAILY. 90 tablet 2  .  lovastatin (MEVACOR) 40 MG tablet Take 0.5 tablets (20 mg total) by mouth at bedtime. 90 tablet 2  . Multiple Vitamins-Minerals (MULTIVITAMINS THER. W/MINERALS) TABS Take 1 tablet by mouth daily.      . nitroGLYCERIN (NITROLINGUAL) 0.4 MG/SPRAY spray Place 1 spray under the tongue every 5 (five) minutes x 3 doses as needed for chest pain. 0.4 g 3  . ranolazine (RANEXA) 1000 MG SR tablet Take 1/2 tablet twice a day 90 tablet 2  . rOPINIRole (REQUIP) 0.5 MG tablet TAKE 1 TABLET (0.5 MG) BY ORAL ROUTE 1-3 HOURS BEFORE BEDTIME AND IN AM  2  .  torsemide (DEMADEX) 20 MG tablet Take 20 mg by mouth every other day.    . vitamin E 200 UNIT capsule Take 200 Units by mouth daily.       No facility-administered medications prior to visit.      Allergies:   Codeine and Sulfonamide derivatives   Social History   Social History  . Marital status: Married    Spouse name: N/A  . Number of children: N/A  . Years of education: N/A   Social History Main Topics  . Smoking status: Former Smoker    Packs/day: 1.00    Years: 38.00    Types: Cigarettes    Quit date: 02/09/2005  . Smokeless tobacco: Never Used     Comment: "quit smoking cigarrettes 2006"  . Alcohol use No  . Drug use: No  . Sexual activity: Not Asked   Other Topics Concern  . None   Social History Narrative  . None     Family History:  The patient's family history includes AAA (abdominal aortic aneurysm) in his father; Breast cancer in his paternal aunt; Diabetes in his sister; Heart attack in his father; Heart disease in his father; Heart failure in his father; Hypertension in his mother; Stroke in his mother; Ulcers in his father.   ROS:   Please see the history of present illness.    ROS All other systems reviewed and are negative.   PHYSICAL EXAM:   VS:  BP 130/62 (BP Location: Right Arm, Patient Position: Sitting, Cuff Size: Normal)   Pulse 60   Ht 5\' 5"  (1.651 m)   Wt 148 lb (67.1 kg)   BMI 24.63 kg/m    GEN: Well  nourished, well developed, in no acute distress  HEENT: normal  Neck: no JVD, carotid bruits, or masses Cardiac: irregularly irregular; no murmurs, rubs, or gallops. 1-2+ edema  Respiratory:  clear to auscultation bilaterally, normal work of breathing GI: soft, nontender, nondistended, + BS MS: no deformity or atrophy  Skin: warm and dry, no rash Neuro:  Alert and Oriented x 3, Strength and sensation are intact Psych: euthymic mood, full affect  Wt Readings from Last 3 Encounters:  09/01/16 148 lb (67.1 kg)  08/11/16 147 lb 12.8 oz (67 kg)  07/21/16 143 lb (64.9 kg)      Studies/Labs Reviewed:   EKG:  EKG is ordered today.  The ekg ordered today demonstrates afib with right bundle branch block.  Recent Labs: 10/14/2015: ALT 11; BUN 26; Creat 1.12; Potassium 5.1; Sodium 140; TSH 0.83   Lipid Panel    Component Value Date/Time   CHOL 172 08/01/2014 0852   TRIG 90 08/01/2014 0852   HDL 66 08/01/2014 0852   CHOLHDL 2.6 08/01/2014 0852   VLDL 18 08/01/2014 0852   LDLCALC 88 08/01/2014 0852    Additional studies/ records that were reviewed today include:   Echo 08/24/2016 LV EF: 55% -   60%  ------------------------------------------------------------------- Study Conclusions  - Left ventricle: The cavity size was normal. Wall thickness was   increased in a pattern of moderate LVH. Systolic function was   normal. The estimated ejection fraction was in the range of 55%   to 60%. Wall motion was normal; there were no regional wall   motion abnormalities. The study is not technically sufficient to   allow evaluation of LV diastolic function. - Mitral valve: Calcified annulus. Mildly thickened leaflets .   There was mild regurgitation. - Left atrium: The atrium was normal in size. -  Right atrium: The atrium was mildly dilated. - Tricuspid valve: There was mild regurgitation. - Pulmonary arteries: PA peak pressure: 48 mm Hg (S). - Inferior vena cava: The vessel was dilated.  The respirophasic   diameter changes were blunted (< 50%), consistent with elevated   central venous pressure.  Impressions:  - Compared to a prior study in 2016, the LVEF is stable at 55-60%.    ASSESSMENT:    1. PAF (paroxysmal atrial fibrillation) ([place])   2. Essential hypertension   3. Coronary artery disease involving coronary bypass graft of native heart without angina pectoris   4. Hyperlipidemia, unspecified hyperlipidemia type   5. PAD (peripheral artery disease) (HCC)   6. Leg edema      PLAN:  In order of problems listed above:  1. Paroxysmal atrial fibrillation: On eliquis since 07/21/2016. He has been compliant with system and cooperation therapy and has not missed any doses. Recent echocardiogram showed normal left atrial size. His amiodarone was increased to 200 mg twice a day. Today's EKG shows he is still in atrial fibrillation. I recommended outpatient cardioversion, he wished to think about this for now. He is aware that staying in atrial fibrillation is option however the longer he is in atrial fibrillation, the less likely he will convert back to sinus rhythm. He does have some fatigue, I'm not entirely sure if this is related to atrial fibrillation, his wife seems to think it may be related to amiodarone. If he decides not to pursue outpatient cardioversion, I would still recommend decrease amiodarone.   2. LE edema: He does have at least 2+ bilateral lower extremity edema, I'm not entirely sure how much atrial fibrillation is contributing to this, he is on torsemide, however he does not take it every day. Even though is listed as the medication he take every other day, in fact he is actually taking it probably was every 3 days. He will get compression stockings.  3. CAD s/p CABG: No obvious angina  4. Hypertension: Blood pressure stable.  5. Hyperlipidemia: on lovastatin.   6. PAD: Known history of carotid artery disease and also occluded bilateral  SFAs.    Medication Adjustments/Labs and Tests Ordered: Current medicines are reviewed at length with the patient today.  Concerns regarding medicines are outlined above.  Medication changes, Labs and Tests ordered today are listed in the Patient Instructions below. Patient Instructions  Medication Instructions:  Your physician recommends that you continue on your current medications as directed. Please refer to the Current Medication list given to you today.  If you need a refill on your cardiac medications before your next appointment, please call your pharmacy.  Follow-Up: Your physician wants you to follow-up in: 2 MONTHS WITH DR Claiborne Billings   Thank you for choosing [facility] at [facility]!!       Signed, Almyra Deforest, [place]  09/01/2016 10:51 PM    [address], [place], [place]  [address] Phone: [phone]; Fax: [phone]

## 2016-09-01 NOTE — Patient Instructions (Signed)
Medication Instructions:  Your physician recommends that you continue on your current medications as directed. Please refer to the Current Medication list given to you today.  If you need a refill on your cardiac medications before your next appointment, please call your pharmacy.  Follow-Up: Your physician wants you to follow-up in: 2 [place]   Thank you for choosing [facility] at [facility]!!

## 2016-09-11 ENCOUNTER — Other Ambulatory Visit: Payer: Self-pay | Admitting: Internal Medicine

## 2016-09-13 ENCOUNTER — Other Ambulatory Visit: Payer: Self-pay | Admitting: Cardiovascular Disease

## 2016-09-18 ENCOUNTER — Other Ambulatory Visit: Payer: Self-pay | Admitting: Internal Medicine

## 2016-11-23 ENCOUNTER — Other Ambulatory Visit: Payer: Self-pay | Admitting: Cardiovascular Disease

## 2016-12-13 ENCOUNTER — Other Ambulatory Visit: Payer: Self-pay | Admitting: Cardiovascular Disease

## 2016-12-13 DIAGNOSIS — I701 Atherosclerosis of renal artery: Secondary | ICD-10-CM

## 2016-12-14 ENCOUNTER — Ambulatory Visit (HOSPITAL_COMMUNITY)
Admission: RE | Admit: 2016-12-14 | Discharge: 2016-12-14 | Disposition: A | Discharge status: Home / Self Care | Payer: Medicare Other | Source: Ambulatory Visit | Attending: Internal Medicine | Admitting: Internal Medicine

## 2016-12-14 ENCOUNTER — Other Ambulatory Visit: Payer: Self-pay | Admitting: Cardiovascular Disease

## 2016-12-14 DIAGNOSIS — I701 Atherosclerosis of renal artery: Secondary | ICD-10-CM | POA: Insufficient documentation

## 2016-12-14 DIAGNOSIS — J90 Pleural effusion, not elsewhere classified: Secondary | ICD-10-CM | POA: Diagnosis not present

## 2016-12-14 DIAGNOSIS — I708 Atherosclerosis of other arteries: Secondary | ICD-10-CM | POA: Diagnosis not present

## 2016-12-15 ENCOUNTER — Other Ambulatory Visit: Payer: Self-pay | Admitting: Internal Medicine

## 2016-12-16 ENCOUNTER — Other Ambulatory Visit: Payer: Self-pay | Admitting: Cardiovascular Disease

## 2016-12-16 DIAGNOSIS — I771 Stricture of artery: Principal | ICD-10-CM

## 2016-12-16 DIAGNOSIS — K551 Chronic vascular disorders of intestine: Secondary | ICD-10-CM

## 2017-01-15 ENCOUNTER — Other Ambulatory Visit: Payer: Self-pay | Admitting: Cardiovascular Disease

## 2017-01-16 NOTE — Telephone Encounter (Signed)
REFILL 

## 2017-01-30 DIAGNOSIS — D649 Anemia, unspecified: Secondary | ICD-10-CM | POA: Diagnosis not present

## 2017-02-02 ENCOUNTER — Encounter: Payer: Self-pay | Admitting: Cardiovascular Disease

## 2017-02-02 ENCOUNTER — Ambulatory Visit: Payer: Medicare Other | Admitting: Cardiovascular Disease

## 2017-02-02 VITALS — BP 128/70 | HR 90 | Ht 65.0 in | Wt 142.6 lb

## 2017-02-02 DIAGNOSIS — I452 Bifascicular block: Secondary | ICD-10-CM

## 2017-02-02 DIAGNOSIS — I481 Persistent atrial fibrillation: Secondary | ICD-10-CM

## 2017-02-02 DIAGNOSIS — R600 Localized edema: Secondary | ICD-10-CM

## 2017-02-02 DIAGNOSIS — I739 Peripheral vascular disease, unspecified: Secondary | ICD-10-CM

## 2017-02-02 DIAGNOSIS — I25810 Atherosclerosis of coronary artery bypass graft(s) without angina pectoris: Secondary | ICD-10-CM

## 2017-02-02 DIAGNOSIS — I4819 Other persistent atrial fibrillation: Secondary | ICD-10-CM

## 2017-02-02 DIAGNOSIS — K921 Melena: Secondary | ICD-10-CM

## 2017-02-02 MED ORDER — METOPROLOL TARTRATE 50 MG PO TABS: 50.0000 mg | ORAL_TABLET | Freq: Two times a day (BID) | ORAL | 3 refills | Status: DC

## 2017-02-02 NOTE — Progress Notes (Signed)
Patient ID: [name], male   DOB: 04-22-1945, 71 y.o.   MRN: [id number]    Primary MD:  Dr. Garwin Brothers  HPI: [name] is a 71 y.o. male who is a former patient of Dr. Rollene Fare.  He presents for a 5 month follow-up cardiology evaluation.  [name] has an extensive cardiac and peripheral vascular history. He underwent initial CABG revascularization surgery in [place] in 1984. In 1996 he required re-do CABG revascularization surgery after all grafts were found to be occluded. At that time he also underwent right carotid endarterectomy. In 1998 he underwent stenting of his left main coronary artery and also had stenting done to the vein graft supplying the marginal vessel. He has history of right renal artery stenosis and underwent initial stenting with predilatation 2005. He has documented chronic right bundle branch block. He also significant peripheral vascular disease with bilateral SFA occlusion. He has known left subclavian artery occlusion with right subclavian stenosis. Has a history of hypertension, hyperlipidemia, and remotely smoked cigarettes. His last catheterization was done in November 2012 which showed significant native CAD with evidence for patent left main stent. He had an occluded circumflex marginal vessel from the AV groove circumflex. There was 80% ostial RCA stenosis with 90% stenosis in the mid right coronary artery proximal to the RV marginal branch and total occlusion of the mid RCA. An occluded vein graft supplying the right coronary artery but evidence for collateralization to the distal right artery the left coronary injection and injection of the grafts. The vein supplying the marginal vessel with the previously placed ostial proximal Taxus stent had in-stent narrowing of 50% followed by 95% stenosis in the body of the proximal third of the vessel graft prior to the previously placed mid grafts 3.0x20 mm Taxus stent. At that time, he underwent difficult but  successful intervention involving long segmental stenosis of the vein graft supplying the circumflex marginal vessel. Additional tandem 3.0x38 mm and 3.0x8 mm Taxus stents were inserted with excellent angiographic result.   He saw Dr. Rollene Fare in August 2014 per at that time due to concern for progressive carotid stenoses repeat Doppler studies were done and he also saw Dr. Sherren Mocha early for followup evaluation. Repeat imaging showed a patent right carotid endarterectomy site the Doppler velocity suggestive of 40  percent stenosis of the bilateral proximal internal carotid arteries. There was left subclavian steal noted with retrograde left vertebral artery flow, monophasic left subclavian artery flow and significant difference in the bilateral brachial pressures.  He established cardiology care with me in December 2014.  A nuclear perfusion study in May 2015 demonstrated mild inferobasal ischemia, most likely secondary to RCA distribution.    On 03/25/2014 he was seen by Dr. Martinique after he developed an episode of atrial fibrillation the day before.  An ECG in Dr. Latina Craver  office showed atrial fibrillation with rate of 120.  His atenolol was increased to 50 mg twice a day, and he converted to sinus rhythm after 18 hours.  When he saw Dr. Martinique the following day he was in sinus rhythm.  On the increased dose of beta blocker he denies recurrent palpitations.  He tells me he recently underwent a colonoscopy and had 3 polyps removed, but also required cauterization for another site that was oozing blood.  This reason, he was not restarted on anticoagulation but has been maintained on low-dose aspirin and Plavix.  He has seen Dr. Gwenlyn Found in follow-up of his PVD and  is status post left renal stenting.  He also is followed by Dr. Donnetta Hutching for his carotid disease.  In August 2017 he was seen by Kerin Ransom after he was found to be in atrial fibrillation 1 week previously at cardiac rehabilitation.  He was started on  amiodarone 200 mg twice a day for 1 week and then 200 mg daily and his atenolol was reduced to 25 mg twice a day.  He was seen several weeks later on 10/29/2015 by Lesia Hausen and was back in sinus rhythm.  He underwent carotid duplex imaging on 12/23/2015.  The right carotid endarterectomy site had Doppler velocities suggestive of a 60-79% stenosis. The left internal carotid artery velocity suggested a 40-59% stenoses.  Renal duplex imaging to just a patent right renal artery stent.  There was stable greater than 60% left renal artery stenosis.    When I  saw him in November 2017 he was maintaining sinus rhythm and  since he was on amiodarone, I recommended he reduce lovastatin to 20 mg and added Zetia for continued aggressive lipid management.  He had progressive lower extremity edema and recommended he increase his torsemide.  In March 2018 he developed episodes of bronchitis.  Since that time his rhythm has been irregular.  He wore a Holter monitor which showed atrial fibrillation with a minimum rate at 52 at 4:02 AM was sleeping and maximum rate at 119.  In the afternoon with an average rate of 82 bpm.  There are frequent PVCs, rare couplets, occasional episodes of bigeminy and trigeminy but no episodes of VT.  There were no pauses.  Previously, his blood pressure had become low and hehad been on losartan and I saw him one month ago there was leg swelling, left greater than right.  He had a chest x-ray with Dr. Reesa Chew on 06/21/2016.  A  follow-up carotid Doppler study in April 2018 showed improvement in his velocities such that his right carotid endarterectomy was patent with velocities now suggestive of 40-59% stenosis.  The left internal carotid velocities suggested a 1-39% stenosis.   When I saw him in May 2018, he was in atrial fibrillation.  I discontinued Plavix and started him on eliquis 5 mg twice a day.  I recommended that he not to take aspirin with his GI history.  I increased amiodarone to  200 mg twice a day and reduced his Ranexa dose to 500 mg twice a day with concomitant therapy.  He was having lower extremity edema.  A Doppler study was negative for DVT by his primary physician.  I recommended support stockings.    I saw him 1 month later and he continue to be in atrial fibrillation.  At that time, we discussed potential DC cardioversion for restoration of sinus rhythm.  He underwent repeat echo on 08/24/2016 which showed an EF of 55-60%.  His left atrium was normal in size and his right atrium was mildly dilated.  PA pressure was increased at 48 mm.  There was moderate LVH. Marland Kitchen  He apparently was seen subsequent to that evaluation by Almyra Deforest and was having 2+ bilateral lower extremity edema.  Further discussion concerning cardioversion was undertaken but the patient initially deferred at that point and wanted to that it further.  He tells me 1 month ago he noticed some bright red blood per rectum.  As result, he self reduced his eliquis from 5 mg twice a day down to 5 mg daily.  Remotely, he has seen Dr.  Scarlette Shorts in the past.  His last colonoscopy was at least 5 years ago.  He had stopped taking amiodarone since he felt to contribute to fatigue, tremors, dizziness, and gait staggering.  The symptomshave improved off amiodarone antiarrhythmic therapy.  Her, he has noticed that at times his pulse can increase up to the low 100s with minimal activity.  He has now been using compression stockings for his ankle edema which has improved.  He presents for reevaluation.    Past Medical History:  Diagnosis Date  . Anemia   . Angina   . Atrial fibrillation ([place])   . Blood transfusion   . Bronchitis   . Bulging discs    "8 of them; thoracic, lumbar, sacral area"  . Chronic back pain   . Colon polyps   . Complication of anesthesia   . Coronary artery disease 01/24/11   Successful PCI long segmental stensois  vein graft to the CX marginal vessel-graft previously stented prox. 3.0x51m TAXUS  stent mid seg 3.0x258mTAXUS stent now tandem stens of 3.0x3878m 3.0x8mm20maced with the seg. 50,80 & 99% stenosis reduced to 0%  . DDD (degenerative disc disease)   . Dysrhythmia    "PAC's and PVC's"  . Esophageal dilatation    "have had it done 7 times; last time ~ 2001"  . Esophageal stricture   . GERD (gastroesophageal reflux disease)   . Headache(784.0)   . Hiatal hernia   . Hyperlipidemia   . Myocardial infarction ([facility]CC)[place]91  [facility]   "had 3 heart attacks this year"  . Paroxysmal atrial fibrillation (HCC)   . Pyloric stenosis   . RBBB   . Renal artery stenosis (HCC)    hhistory of right renal artery stenting and re-intervention for "in-stent restenosis.  . [place] CAB[place] (coronary artery [place] graft[place]    "when I had the heart attacks"  . Subclavian artery stenosis, left (HCC)[place]. Ulcer    "small; Dr. PerrHenrene Pastornd it 02/2010"    Past Surgical History:  Procedure Laterality Date  . ADENOIDECTOMY     "when I was an infant"  . APPENDECTOMY  1953  . CAROTID ENDARTERECTOMY Right 1996   CE  . CORONARY ANGIOPLASTY WITH STENT PLACEMENT  01/24/11   "today makes a total of 9 stents"  . CORONARY ARTERY BYPASS GRAFT  1984   CABG X 4  . CORONARY ARTERY BYPASS GRAFT  1996   CABG X 4  . LEFT HEART CATHETERIZATION WITH CORONARY/GRAFT ANGIOGRAM N/A 01/24/2011   Procedure: LEFT HEART CATHETERIZATION WITH COROBeatrix Fettersurgeon: [name];  [facility];  Service: Cardiovascular;  Laterality: N/A;  . pylondial cyst removal    . RETINAL DETACHMENT SURGERY  04/2007   right  . TONSILLECTOMY  1972    Allergies  Allergen Reactions  . Codeine Nausea And Vomiting  . Sulfonamide Derivatives Other (See Comments)    REACTION: rash    Current Outpatient Medications  Medication Sig Dispense Refill  . butalbital-aspirin-caffeine (FIORINAL) 50-325-40 MG per capsule  Take 1 capsule by mouth every 4 (four) hours as needed. FOR HEADACHE.     . CoMarland Kitchennzyme Q10 (CO Q-10) 100 MG CAPS Take 1 tablet by mouth daily.     . duMarland Kitchenasteride (AVODART) 0.5 MG capsule Take 0.5 mg by mouth daily.     . ELMarland KitchenQUIS 5 MG TABS  tablet TAKE 1 TABLET BY MOUTH TWICE A DAY 60 tablet 3  . esomeprazole (NEXIUM) 40 MG capsule TAKE ONE CAPSULE BY MOUTH TWICE DAILY 180 capsule 0  . ezetimibe (ZETIA) 10 MG tablet Take 1 tablet by mouth daily.  3  . ezetimibe (ZETIA) 10 MG tablet TAKE 1 TABLET (10 MG TOTAL) BY MOUTH DAILY. 90 tablet 3  . loratadine (CLARITIN) 10 MG tablet Take 1 tablet by mouth daily as needed for allergies, rhinitis or itching.   3  . losartan (COZAAR) 50 MG tablet TAKE 1 TABLET (50 MG TOTAL) BY MOUTH DAILY. 90 tablet 2  . lovastatin (MEVACOR) 40 MG tablet Take 0.5 tablets (20 mg total) by mouth at bedtime. 90 tablet 2  . lovastatin (MEVACOR) 40 MG tablet TAKE 1 TABLET BY MOUTH AT BEDTIME 90 tablet 1  . Multiple Vitamins-Minerals (MULTIVITAMINS THER. W/MINERALS) TABS Take 1 tablet by mouth daily.      . nitroGLYCERIN (NITROLINGUAL) 0.4 MG/SPRAY spray PLACE ONE SPRAY UNDER TONGUE EVERY 5 MINUITES FOR UP TO 3 DOSES AS NEEDED FOR CHEST PAIN 4.9 g 3  . ranolazine (RANEXA) 1000 MG SR tablet Take 1/2 tablet twice a day 90 tablet 2  . rOPINIRole (REQUIP) 0.5 MG tablet TAKE 1 TABLET (0.5 MG) BY ORAL ROUTE 1-3 HOURS BEFORE BEDTIME AND IN AM  2  . torsemide (DEMADEX) 20 MG tablet Take 20 mg by mouth every other day.    . vitamin E 200 UNIT capsule Take 200 Units by mouth daily.      . metoprolol tartrate (LOPRESSOR) 50 MG tablet Take 1 tablet (50 mg total) by mouth 2 (two) times daily. 180 tablet 3   No current facility-administered medications for this visit.     Social History   Socioeconomic History  . Marital status: Married    Spouse name: Not on file  . Number of children: Not on file  . Years of education: Not on file  . Highest education level: Not on file  Social Needs  .  Financial resource strain: Not on file  . Food insecurity - worry: Not on file  . Food insecurity - inability: Not on file  . Transportation needs - medical: Not on file  . Transportation needs - non-medical: Not on file  Occupational History  . Not on file  Tobacco Use  . Smoking status: Former Smoker    Packs/day: 1.00    Years: 38.00    Pack years: 38.00    Types: Cigarettes    Last attempt to quit: 02/09/2005    Years since quitting: 11.9  . Smokeless tobacco: Never Used  . Tobacco comment: "quit smoking cigarrettes 2006"  Substance and Sexual Activity  . Alcohol use: No    Alcohol/week: 0.0 oz  . Drug use: No  . Sexual activity: Not on file  Other Topics Concern  . Not on file  Social History Narrative  . Not on file   Socially he is married has 2 children 2 stepchildren. He quit tobacco in 2000.  Family History  Problem Relation Age of Onset  . Breast cancer Paternal Aunt   . Diabetes Sister   . Ulcers Father   . Heart disease Father   . Heart failure Father   . Heart attack Father   . AAA (abdominal aortic aneurysm) Father   . Stroke Mother   . Hypertension Mother   . 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 Henrene Pastor. GU: Occasional concentrated or  dark urine without blood Musculoskeletal: Negative; no myalgias, joint pain, or weakness Hematologic/Oncology: Negative; no easy bruising, bleeding Endocrine: Negative; no heat/cold intolerance; no diabetes Neuro: Negative; no changes in balance, headaches Skin: Negative; No rashes or skin lesions Psychiatric: Negative; No behavioral problems, depression Sleep: Negative; No snoring, daytime  sleepiness, hypersomnolence, bruxism, restless legs, hypnogognic hallucinations, no cataplexy Other comprehensive 14 point system review is negative.   PE BP 128/70   Pulse 90   Ht _0  (1.651 m)   Wt 142 lb 9.6 oz (64.7 kg)   BMI 23.73 kg/m    Repeat blood pressure by me was 132/74.  Wt Readings from Last 3 Encounters:  02/02/17 142 lb 9.6 oz (64.7 kg)  09/01/16 148 lb (67.1 kg)  08/11/16 147 lb 12.8 oz (67 kg)    General: Alert, oriented, no distress.  Skin: normal turgor, no rashes, warm and dry HEENT: Normocephalic, atraumatic. Pupils equal round and reactive to light; sclera anicteric; extraocular muscles intact;  Nose without nasal septal hypertrophy Mouth/Parynx benign; Mallinpatti scale 3 Neck: No JVD, no carotid bruits; normal carotid upstroke Lungs: clear to ausculatation and percussion; no wheezing or rales Chest wall: without tenderness to palpitation Heart: PMI not displaced, irregularly irregular, s1 s2 normal, 1/6 systolic murmur, no diastolic murmur, no rubs, gallops, thrills, or heaves Abdomen: soft, nontender; no hepatosplenomehaly, BS+; abdominal aorta nontender and not dilated by palpation. Back: no CVA tenderness Pulses 2+ Musculoskeletal: full range of motion, normal strength, no joint deformities Extremities: Support stockings in place with trivial edema; no clubbing cyanosis, Homan's sign negative  Neurologic: grossly nonfocal; Cranial nerves grossly wnl Psychologic: Normal mood and affect   ECG (independently read by me): Atrial flutter with variable block.  PVC.  Left posterior hemiblock.  Right bundle branch block.  June 2018 ECG (independently read by me): Atrial fibrillation with ventricular rate at 70 bpm.  Right bundle-branch block with repolarization changes.  Probable left posterior fascicular block.  07/21/2016 ECG (independently read by me): atrial fibrillation with a controlled ventricular rate at 71 bpm.  Right bundle branch block and  left posterior hemiblock.  Small inferior Q waves.  November 2017 ECG (independently read by me): Sinus rhythm with a PVC.  Right bundle branch block and probable left posterior hemiblock.  Inferior Q waves with preserved R waves.  February 2017 ECG (independently read by me): normal sinus rhythm at 61 bpm. Probable left atrial enlargement. Right bundle branch block.  ECG (independently read by me): Sinus bradycardia 58 bpm.  Increased QTc interval 473 ms.  Right bundle-branch block.  May 2016 ECG (independently read by me): Sinus bradycardia 59 bpm with right bundle branch block.I ST-T changes.  QTc interval 475 ms.  PR interval 182 ms.  No ectopy.  February 2016 ECG (independently read by me) normal sinus rhythm at 63 bpm.  Right bundle-branch block.  Probable left posterior fascicular block.  PR interval 170 ms.  QTC interval 480 ms.  November 2015 ECG (independently read by me): Normal sinus rhythm at 62 bpm.  Right bundle branch block.  Small nondiagnostic inferior Q waves.  Prior May 2015ECG : sinus rhythm at 58 beats per minute.  Right bundle branch block.  Prior  December 2014 ECG: Sinus rhythm with sinus bradycardia 55 beats per minute. Right bundle branch block.  LABS: BMP Latest Ref Rng & Units 10/14/2015 11/25/2014 08/01/2014  Glucose 65 - 99 mg/dL 96 89 96  BUN 7 - 25 mg/dL 26(H) 26(H) 26(H)  Creatinine 0.70 - 1.18 mg/dL 1.12 1.06 1.21  Sodium 135 - 146 mmol/L 140 138 140  Potassium 3.5 - 5.3 mmol/L 5.1 4.8 4.6  Chloride 98 - 110 mmol/L 110 106 108  CO2 20 - 31 mmol/L _0 Calcium 8.6 - 10.3 mg/dL 8.7 8.7 9.2   Hepatic Function Latest Ref Rng & Units 10/14/2015 11/25/2014 08/01/2014  Total Protein 6.1 - 8.1 g/dL 5.8(L) 6.0(L) 6.2  Albumin 3.6 - 5.1 g/dL 3.7 3.9 3.9  AST 10 - 35 U/L _1 ALT 9 - 46 U/L _2 Alk Phosphatase 40 - 115 U/L 64 75 69  Total Bilirubin 0.2 - 1.2 mg/dL 0.4 0.3 0.4   CBC Latest Ref Rng & Units 11/25/2014 01/25/2011 01/25/2011  WBC 4.0 -  10.5 K/uL 7.1 7.4 -  Hemoglobin 13.0 - 17.0 g/dL 13.1 9.8(L) 10.1(L)  Hematocrit 39.0 - 52.0 % 38.3(L) 28.0(L) 28.5(L)  Platelets 150 - 400 K/uL 188 120(L) -   Lab Results  Component Value Date   MCV 97.0 11/25/2014   MCV 94.0 01/25/2011   MCV 93.2 01/25/2011   Lab Results  Component Value Date   TSH 0.83 10/14/2015    Lipid Panel     Component Value Date/Time   CHOL 172 08/01/2014 0852   TRIG 90 08/01/2014 0852   HDL 66 08/01/2014 0852   CHOLHDL 2.6 08/01/2014 0852   VLDL 18 08/01/2014 0852   LDLCALC 88 08/01/2014 0852     RADIOLOGY: No results found.  IMPRESSION:  1. Persistent atrial fibrillation ([place])   2. Blood in stool   3. Coronary artery disease involving coronary bypass graft of native heart without angina pectoris   4. PAD (peripheral artery disease) ([place])   5. RBBB (right bundle branch block with left anterior fascicular block)   6. Leg edema     ASSESSMENT AND PLAN: [name] is a 71 year old gentleman who has an extensive CAD/PVD history, and is 34 years status post his initial CABG revascularization surgery done in [place] in 1984 and 22 years status post his second CABG surgery in 1996. He has had multiple peripheral vascular procedures including carotid endarterectomy, renal stenting, and is documented SFA occlusion. His last catheterization/ intervention was done in 2012 and demonstrated diffuse disease in the vein graft supplying the marginal vessel and 2 additional Taxus stents inserted commencing ostially and extending to the proximal portion of the recently placed mid prior stent. He has a known occluded graft to the RCA with a mid RCA occlusion with left to right collaterals. He also has subclavian steal. He does have mild hypokinesis inferolaterally in the basal inferior wall with an EF of 45% at that catheterization. He has chronic right bundle branch block. He has not been demonstrated to have an abdominal aortic aneurysm. When I had seen him  previously he had complained of mild chest anginal symptomatology typically when he starts exercise and particularly after eating. He had potential areas of myocardium which may be responsible for ischemia.  At that time, I added  Ranexa 500 mg twice a day to his medical regimen and subsequently this was further titrated to 1000 mg twice a day , which resolved his  symptomatology.  In January 2016 he was documented to have paroxysmal atrial fibrillation which responded to increased beta blocker therapy and lasted for ~18 hours.  Because of GI issues, he was not on full dose anticoagulation and has been on aspirin and Plavix.  He was found to have recurrent atrial fibrillation in August 2017 which responded to amiodarone therapy.  When I last saw him in November 2017 he was in sinus rhythm.  Apparently since developing bronchitis in March 2018 he developed recurrent atrial fibrillation.  A Holter monitor study showed a predominant atrial fibrillation rhythm with frequent PVCs, rare couplets, occasional bigeminy and trigeminy but no episodes of NSVT.  Amiodarone was added to his medical regimen and is Ranexa dose was reduced due to concomitant treatment.  Unfortunately, he did not tolerate amiodarone and developed fatigue, tremors, staggering gait which led to self discontinuance.  He has noticed increased heart rates with minimal activity.  Since discontinuing amiodarone.  He also had self reduced his eliquis dose one month ago when he again noted some bright red blood per rectum which has resolved on the one daily dosing.  However, explain to him that he is not therapeutically anticoagulated.  His renal function is normal with a creatinine of 23.70 is 71 years old and requires a 5 twice a day dosing for full anticoagulation.  I recommended  a follow-up appointment to see Dr. Scarlette Shorts for GI evaluation.  It is 5 years since his last colonoscopy.  I'm starting him on metoprolol 50 mg twice a day for improved rate  control, particularly since he is not on amiodarone.  I discussed options concerning continuing him in permanent AF, initiation of possible Tikosyn or consideration for atrial fibrillation ablation.  On his most recent echo Doppler study both atrium were normal size and not dilated.  I will refer him for EP evaluation.  I will see him in 4 months for follow-up assessment.  Time spent: 25 minutes  Troy Sine, MD, [facility]  02/03/2017 8:21 AM

## 2017-02-02 NOTE — Patient Instructions (Addendum)
Medication Instructions:  STOP atenolol   START metoprolol tartrate (Lopressor) 50 mg two times daily  Follow-Up: You have been referred to: Dr. Perry-Gastroenterologist  You have been referred to: Electrophysiologist at our [place] physician recommends that you schedule a follow-up appointment in: 4 months with Dr. Claiborne Billings.   Any Other Special Instructions Will Be Listed Below (If Applicable).     If you need a refill on your cardiac medications before your next appointment, please call your pharmacy.

## 2017-02-03 ENCOUNTER — Encounter: Payer: Self-pay | Admitting: Cardiovascular Disease

## 2017-02-06 ENCOUNTER — Ambulatory Visit: Payer: Medicare Other | Admitting: Internal Medicine

## 2017-02-24 ENCOUNTER — Telehealth: Payer: Self-pay | Admitting: Cardiovascular Disease

## 2017-02-24 NOTE — Telephone Encounter (Signed)
Patient called w/MD recommendations He voiced understanding Advised to notify our office if HR has not improved by end of next week  He checks his BP about 6x/daily. He reports BP is generally 120s/70s, has gotten up to 947M systolic when he felt bad/HR was up.

## 2017-02-24 NOTE — Telephone Encounter (Signed)
As long as blood pressure stable, can try increasing metoprolol to 75 mg twice a day

## 2017-02-24 NOTE — Telephone Encounter (Signed)
New MEssage  Pt c/o medication issue:  1. Name of Medication: Metoprolol    2. How are you currently taking this medication (dosage and times per day)? 50mg   3. Are you having a reaction (difficulty breathing--STAT)? Heart rate high per pt ranging from 92-122  4. What is your medication issue? Pt would like to discus being taken off this medication. He states the Atenolol worked better for him stabilizing his heart rate. Please call back to discuss

## 2017-02-24 NOTE — Telephone Encounter (Signed)
Returned call to patient of Dr. Claiborne Billings. He was seen end of Nov 2018 and atenolol was stopped and he was started on lopressor 50mg  BID. He reports this B-blocker is not controlling his HR. He states his HR is typically in the 90s on the med (has only been in the 70s on occasion, highest 122). He states when his HR is too high, he gets angina. He cannot get out and do much when his HR is elevated. He would like to switch back to atenolol. Of note, he reports he was only taking atenolol 25mg  daily, when he was prescribed to take atenolol 75mg  in the morning and 50mg  in the evening after he stopped amiodarone but he states he failed to increase the atenolol to this dose. He would like Dr. Claiborne Billings to review this request and notify him if change is OK. He uses [facility] in [place] on [address]

## 2017-03-01 DIAGNOSIS — I771 Stricture of artery: Secondary | ICD-10-CM | POA: Insufficient documentation

## 2017-03-01 DIAGNOSIS — M549 Dorsalgia, unspecified: Secondary | ICD-10-CM

## 2017-03-01 DIAGNOSIS — I499 Cardiac arrhythmia, unspecified: Secondary | ICD-10-CM | POA: Insufficient documentation

## 2017-03-01 DIAGNOSIS — Z951 Presence of aortocoronary bypass graft: Secondary | ICD-10-CM | POA: Insufficient documentation

## 2017-03-01 DIAGNOSIS — G8929 Other chronic pain: Secondary | ICD-10-CM | POA: Insufficient documentation

## 2017-03-01 DIAGNOSIS — T8859XA Other complications of anesthesia, initial encounter: Secondary | ICD-10-CM | POA: Insufficient documentation

## 2017-03-01 DIAGNOSIS — T4145XA Adverse effect of unspecified anesthetic, initial encounter: Secondary | ICD-10-CM | POA: Insufficient documentation

## 2017-03-01 DIAGNOSIS — J40 Bronchitis, not specified as acute or chronic: Secondary | ICD-10-CM | POA: Insufficient documentation

## 2017-03-01 DIAGNOSIS — K635 Polyp of colon: Secondary | ICD-10-CM | POA: Insufficient documentation

## 2017-03-01 DIAGNOSIS — K219 Gastro-esophageal reflux disease without esophagitis: Secondary | ICD-10-CM | POA: Insufficient documentation

## 2017-03-01 DIAGNOSIS — K2289 Other specified disease of esophagus: Secondary | ICD-10-CM | POA: Insufficient documentation

## 2017-03-01 DIAGNOSIS — I4510 Unspecified right bundle-branch block: Secondary | ICD-10-CM | POA: Insufficient documentation

## 2017-03-01 DIAGNOSIS — K449 Diaphragmatic hernia without obstruction or gangrene: Secondary | ICD-10-CM | POA: Insufficient documentation

## 2017-03-01 DIAGNOSIS — K222 Esophageal obstruction: Secondary | ICD-10-CM | POA: Insufficient documentation

## 2017-03-01 DIAGNOSIS — I480 Paroxysmal atrial fibrillation: Secondary | ICD-10-CM | POA: Insufficient documentation

## 2017-03-01 DIAGNOSIS — K228 Other specified diseases of esophagus: Secondary | ICD-10-CM | POA: Insufficient documentation

## 2017-03-01 DIAGNOSIS — K311 Adult hypertrophic pyloric stenosis: Secondary | ICD-10-CM | POA: Insufficient documentation

## 2017-03-01 DIAGNOSIS — I4891 Unspecified atrial fibrillation: Secondary | ICD-10-CM | POA: Insufficient documentation

## 2017-03-01 DIAGNOSIS — E785 Hyperlipidemia, unspecified: Secondary | ICD-10-CM | POA: Insufficient documentation

## 2017-03-01 DIAGNOSIS — I701 Atherosclerosis of renal artery: Secondary | ICD-10-CM | POA: Insufficient documentation

## 2017-03-01 DIAGNOSIS — R0600 Dyspnea, unspecified: Secondary | ICD-10-CM | POA: Insufficient documentation

## 2017-03-16 ENCOUNTER — Other Ambulatory Visit: Payer: Self-pay | Admitting: Cardiovascular Disease

## 2017-03-16 ENCOUNTER — Ambulatory Visit: Payer: Medicare Other | Admitting: Cardiology

## 2017-03-16 ENCOUNTER — Encounter: Payer: Self-pay | Admitting: Cardiology

## 2017-03-16 ENCOUNTER — Other Ambulatory Visit: Payer: Self-pay | Admitting: Internal Medicine

## 2017-03-16 VITALS — BP 122/58 | HR 70 | Ht 65.5 in | Wt 139.4 lb

## 2017-03-16 DIAGNOSIS — I25708 Atherosclerosis of coronary artery bypass graft(s), unspecified, with other forms of angina pectoris: Secondary | ICD-10-CM

## 2017-03-16 DIAGNOSIS — I481 Persistent atrial fibrillation: Secondary | ICD-10-CM | POA: Diagnosis not present

## 2017-03-16 DIAGNOSIS — I4819 Other persistent atrial fibrillation: Secondary | ICD-10-CM

## 2017-03-16 MED ORDER — AMIODARONE HCL 200 MG PO TABS: 200.0000 mg | ORAL_TABLET | Freq: Every day | ORAL | 3 refills | Status: DC

## 2017-03-16 NOTE — Patient Instructions (Addendum)
Medication Instructions:  Your physician has recommended you make the following change in your medication:  1. START Amiodarone 200 mg daily  * If you need a refill on your cardiac medications before your next appointment, please call your pharmacy. *  Labwork: None ordered  Testing/Procedures: Your physician has recommended that you have a Cardioversion (DCCV) in one month. Electrical Cardioversion uses a jolt of electricity to your heart either through paddles or wired patches attached to your chest. This is a controlled, usually prescheduled, procedure. Defibrillation is done under light anesthesia in the hospital, and you usually go home the day of the procedure. This is done to get your heart back into a normal rhythm. You are not awake for the procedure.   Jocee Kissick, RN will call you to arrange this procedure.  Follow-Up: Will schedule follow up with our PA/NP based upon cardioversion date.  We will schedule follow up when nurse calls you to arrange cardioversion.  Your physician recommends that you schedule a follow-up appointment in: 3 months with Dr. Curt Bears.  Thank you for choosing [facility]!!   Trinidad Curet, RN [phone]  Any Other Special Instructions Will Be Listed Below (If Applicable).   Amiodarone tablets What is this medicine? AMIODARONE (a MEE oh da rone) is an antiarrhythmic drug. It helps make your heart beat regularly. Because of the side effects caused by this medicine, it is only used when other medicines have not worked. It is usually used for heartbeat problems that may be life threatening. This medicine may be used for other purposes; ask your health care provider or pharmacist if you have questions. COMMON BRAND NAME(S): Cordarone, Pacerone What should I tell my health care provider before I take this medicine? They need to know if you have any of these conditions: -liver disease -lung disease -other heart problems -thyroid disease -an unusual  or allergic reaction to amiodarone, iodine, other medicines, foods, dyes, or preservatives -pregnant or trying to get pregnant -breast-feeding How should I use this medicine? Take this medicine by mouth with a glass of water. Follow the directions on the prescription label. You can take this medicine with or without food. However, you should always take it the same way each time. Take your doses at regular intervals. Do not take your medicine more often than directed. Do not stop taking except on the advice of your doctor or health care professional. A special MedGuide will be given to you by the pharmacist with each prescription and refill. Be sure to read this information carefully each time. Talk to your pediatrician regarding the use of this medicine in children. Special care may be needed. Overdosage: If you think you have taken too much of this medicine contact a poison control center or emergency room at once. NOTE: This medicine is only for you. Do not share this medicine with others. What if I miss a dose? If you miss a dose, take it as soon as you can. If it is almost time for your next dose, take only that dose. Do not take double or extra doses. What may interact with this medicine? Do not take this medicine with any of the following medications: -abarelix -apomorphine -arsenic trioxide -certain antibiotics like erythromycin, gemifloxacin, levofloxacin, pentamidine -certain medicines for depression like amoxapine, tricyclic antidepressants -certain medicines for fungal infections like fluconazole, itraconazole, ketoconazole, posaconazole, voriconazole -certain medicines for irregular heart beat like disopyramide, dofetilide, dronedarone, ibutilide, propafenone, sotalol -certain medicines for malaria like chloroquine, halofantrine -cisapride -droperidol -haloperidol -hawthorn -maprotiline -  methadone -phenothiazines like chlorpromazine, mesoridazine,  thioridazine -pimozide -ranolazine -red yeast rice -vardenafil -ziprasidone This medicine may also interact with the following medications: -antiviral medicines for HIV or AIDS -certain medicines for blood pressure, heart disease, irregular heart beat -certain medicines for cholesterol like atorvastatin, cerivastatin, lovastatin, simvastatin -certain medicines for hepatitis C like sofosbuvir and ledipasvir; sofosbuvir -certain medicines for seizures like phenytoin -certain medicines for thyroid problems -certain medicines that treat or prevent blood clots like warfarin -cholestyramine -cimetidine -clopidogrel -cyclosporine -dextromethorphan -diuretics -fentanyl -general anesthetics -grapefruit juice -lidocaine -loratadine -methotrexate -other medicines that prolong the QT interval (cause an abnormal heart rhythm) -procainamide -quinidine -rifabutin, rifampin, or rifapentine -St. John's Wort -trazodone This list may not describe all possible interactions. Give your health care provider a list of all the medicines, herbs, non-prescription drugs, or dietary supplements you use. Also tell them if you smoke, drink alcohol, or use illegal drugs. Some items may interact with your medicine. What should I watch for while using this medicine? Your condition will be monitored closely when you first begin therapy. Often, this drug is first started in a hospital or other monitored health care setting. Once you are on maintenance therapy, visit your doctor or health care professional for regular checks on your progress. Because your condition and use of this medicine carry some risk, it is a good idea to carry an identification card, necklace or bracelet with details of your condition, medications, and doctor or health care professional. Dennis Bast may get drowsy or dizzy. Do not drive, use machinery, or do anything that needs mental alertness until you know how this medicine affects you. Do not stand  or sit up quickly, especially if you are an older patient. This reduces the risk of dizzy or fainting spells. This medicine can make you more sensitive to the sun. Keep out of the sun. If you cannot avoid being in the sun, wear protective clothing and use sunscreen. Do not use sun lamps or tanning beds/booths. You should have regular eye exams before and during treatment. Call your doctor if you have blurred vision, see halos, or your eyes become sensitive to light. Your eyes may get dry. It may be helpful to use a lubricating eye solution or artificial tears solution. If you are going to have surgery or a procedure that requires contrast dyes, tell your doctor or health care professional that you are taking this medicine. What side effects may I notice from receiving this medicine? Side effects that you should report to your doctor or health care professional as soon as possible: -allergic reactions like skin rash, itching or hives, swelling of the face, lips, or tongue -blue-gray coloring of the skin -blurred vision, seeing blue green halos, increased sensitivity of the eyes to light -breathing problems -chest pain -dark urine -fast, irregular heartbeat -feeling faint or light-headed -intolerance to heat or cold -nausea or vomiting -pain and swelling of the scrotum -pain, tingling, numbness in feet, hands -redness, blistering, peeling or loosening of the skin, including inside the mouth -spitting up blood -stomach pain -sweating -unusual or uncontrolled movements of body -unusually weak or tired -weight gain or loss -yellowing of the eyes or skin Side effects that usually do not require medical attention (report to your doctor or health care professional if they continue or are bothersome): -change in sex drive or performance -constipation -dizziness -headache -loss of appetite -trouble sleeping This list may not describe all possible side effects. Call your doctor for medical  advice about side effects. You may  report side effects to FDA at 1-800-FDA-1088. Where should I keep my medicine? Keep out of the reach of children. Store at room temperature between 20 and 25 degrees C (68 and 77 degrees F). Protect from light. Keep container tightly closed. Throw away any unused medicine after the expiration date. NOTE: This sheet is a summary. It may not cover all possible information. If you have questions about this medicine, talk to your doctor, pharmacist, or health care provider.  2018 Elsevier/Gold Standard (2013-05-27 19:48:11)   Electrical Cardioversion Electrical cardioversion is the delivery of a jolt of electricity to restore a normal rhythm to the heart. A rhythm that is too fast or is not regular keeps the heart from pumping well. In this procedure, sticky patches or metal paddles are placed on the chest to deliver electricity to the heart from a device. This procedure may be done in an emergency if:  There is low or no blood pressure as a result of the heart rhythm.  Normal rhythm must be restored as fast as possible to protect the brain and heart from further damage.  It may save a life.  This procedure may also be done for irregular or fast heart rhythms that are not immediately life-threatening. Tell a health care provider about:  Any allergies you have.  All medicines you are taking, including vitamins, herbs, eye drops, creams, and over-the-counter medicines.  Any problems you or family members have had with anesthetic medicines.  Any blood disorders you have.  Any surgeries you have had.  Any medical conditions you have.  Whether you are pregnant or may be pregnant. What are the risks? Generally, this is a safe procedure. However, problems may occur, including:  Allergic reactions to medicines.  A blood clot that breaks free and travels to other parts of your body.  The possible return of an abnormal heart rhythm within hours or days  after the procedure.  Your heart stopping (cardiac arrest). This is rare.  What happens before the procedure? Medicines  Your health care provider may have you start taking: ? Blood-thinning medicines (anticoagulants) so your blood does not clot as easily. ? Medicines may be given to help stabilize your heart rate and rhythm.  Ask your health care provider about changing or stopping your regular medicines. This is especially important if you are taking diabetes medicines or blood thinners. General instructions  Plan to have someone take you home from the hospital or clinic.  If you will be going home right after the procedure, plan to have someone with you for 24 hours.  Follow instructions from your health care provider about eating or drinking restrictions. What happens during the procedure?  To lower your risk of infection: ? Your health care team will wash or sanitize their hands. ? Your skin will be washed with soap.  An IV tube will be inserted into one of your veins.  You will be given a medicine to help you relax (sedative).  Sticky patches (electrodes) or metal paddles may be placed on your chest.  An electrical shock will be delivered. The procedure may vary among health care providers and hospitals. What happens after the procedure?  Your blood pressure, heart rate, breathing rate, and blood oxygen level will be monitored until the medicines you were given have worn off.  Do not drive for 24 hours if you were given a sedative.  Your heart rhythm will be watched to make sure it does not change. This information is not  intended to replace advice given to you by your health care provider. Make sure you discuss any questions you have with your health care provider. Document Released: 02/11/2002 Document Revised: 10/21/2015 Document Reviewed: 08/28/2015 Elsevier Interactive Patient Education  2017 [employer].

## 2017-03-16 NOTE — Progress Notes (Signed)
Electrophysiology Office Note   Date:  03/16/2017   ID:  [name], [name] 05/22/1945, MRN [id number]  PCP:  Garwin Brothers, MD  Cardiologist:  Claiborne Billings Primary Electrophysiologist:  Will Meredith Leeds, MD    Chief Complaint  Patient presents with  . New Patient (Initial Visit)    Persistent Afib     History of Present Illness: [name] is a 72 y.o. male who is being seen today for the evaluation of atrial fibrillation at the request of [name]. Presenting today for electrophysiology evaluation.  He underwent CABG in [place] in 1984.  In 1996 he required redo CABG.  He also had a carotid endarterectomy at the time.  In 1998 he underwent stenting of the left main.  He has renal artery stenosis and underwent stenting in [place] in 2005.  He has a documented right bundle branch block.  He has significant peripheral vascular disease with bilateral SFA occlusions.  He has a subclavian artery occlusion with right subclavian stenosis.  He also has hypertension, hyperlipidemia, and remote smoking history.  Cath in 2012 showed significant coronary artery disease.  He has had a long history of atrial fibrillation with cardioversion.  He has been on amiodarone, but had to stop this due to fatigue, tremors, dizziness, and gait staggering.  He has noticed that his heart rate goes up in the low 100s with minimal activity.   Today, he denies symptoms of palpitations,  orthopnea, PND, lower extremity edema, claudication, dizziness, presyncope, syncope, bleeding, or neurologic sequela. The patient is tolerating medications without difficulties.  He is having them fatigue and minimal shortness of breath.  Feels that this is due to his atrial fibrillation.  He was not short of breath or fatigue prior to his diagnosis.  He is also been having chest pain.  He has chronic stable angina.  His chest pain occurs a few times a day.     Past Medical History:  Diagnosis Date  . Anemia   . Angina   . Atrial  fibrillation ([place])   . Blood transfusion   . Bronchitis   . Bulging discs    "8 of them; thoracic, lumbar, sacral area"  . Chronic back pain   . Colon polyps   . Complication of anesthesia   . Coronary artery disease 01/24/11   Successful PCI long segmental stensois  vein graft to the CX marginal vessel-graft previously stented prox. 3.0x42mm TAXUS stent mid seg 3.0x9mm TAXUS stent now tandem stens of 3.0x2mm & 3.0x2mm placed with the seg. 50,80 & 99% stenosis reduced to 0%  . DDD (degenerative disc disease)   . Dysrhythmia    "PAC's and PVC's"  . Esophageal dilatation    "have had it done 7 times; last time ~ 2001"  . Esophageal stricture   . GERD (gastroesophageal reflux disease)   . Headache(784.0)   . Hiatal hernia   . Hyperlipidemia   . Myocardial infarction ([place]) 1991  . Myocardial infarction [name]) 2005   "had 3 heart attacks this year"  . Paroxysmal atrial fibrillation ([facility])   . Pyloric stenosis   . RBBB   . Renal artery stenosis ([facility])    hhistory of right renal artery stenting and re-intervention for "in-stent restenosis.  . S/P CABG (coronary artery bypass graft) [name]  . Shortness of breath    "when I had the heart attacks"  . Subclavian artery stenosis, left ([place])   .  Ulcer    "small; Dr. Henrene Pastor found it 02/2010"   Past Surgical History:  Procedure Laterality Date  . ADENOIDECTOMY     "when I was an infant"  . APPENDECTOMY  1953  . CAROTID ENDARTERECTOMY Right 1996   CE  . CORONARY ANGIOPLASTY WITH STENT PLACEMENT  01/24/11   "today makes a total of 9 stents"  . CORONARY ARTERY BYPASS GRAFT  1984   CABG X 4  . CORONARY ARTERY BYPASS GRAFT  1996   CABG X 4  . LEFT HEART CATHETERIZATION WITH CORONARY/GRAFT ANGIOGRAM N/A 01/24/2011   Procedure: LEFT HEART CATHETERIZATION WITH Beatrix Fetters;  Surgeon: Troy Sine, MD;  Location[facility] C[facility] LAB;  Service: Cardiovascular;  Laterality: N/A;  . pylondial cyst  removal    . RETINAL DETACHMENT SURGERY  04/2007   right  . TONSILLECTOMY  1972     Current Outpatient Medications  Medication Sig Dispense Refill  . butalbital-aspirin-caffeine (FIORINAL) 50-325-40 MG per capsule Take 1 capsule by mouth every 4 (four) hours as needed. FOR HEADACHE.     Marland Kitchen Coenzyme Q10 (CO Q-10) 100 MG CAPS Take 1 tablet by mouth daily.     Marland Kitchen dutasteride (AVODART) 0.5 MG capsule Take 0.5 mg by mouth daily.     Marland Kitchen ELIQUIS 5 MG TABS tablet TAKE 1 TABLET BY MOUTH TWICE A DAY 60 tablet 3  . esomeprazole (NEXIUM) 40 MG capsule TAKE ONE CAPSULE BY MOUTH TWICE DAILY 180 capsule 0  . loratadine (CLARITIN) 10 MG tablet Take 1 tablet by mouth daily as needed for allergies, rhinitis or itching.   3  . lovastatin (MEVACOR) 40 MG tablet TAKE 1 TABLET BY MOUTH AT BEDTIME 90 tablet 1  . metoprolol tartrate (LOPRESSOR) 50 MG tablet Take 75 mg by mouth 2 (two) times daily.    . Multiple Vitamins-Minerals (MULTIVITAMINS THER. W/MINERALS) TABS Take 1 tablet by mouth daily.      . nitroGLYCERIN (NITROLINGUAL) 0.4 MG/SPRAY spray PLACE ONE SPRAY UNDER TONGUE EVERY 5 MINUITES FOR UP TO 3 DOSES AS NEEDED FOR CHEST PAIN 4.9 g 3  . ranolazine (RANEXA) 1000 MG SR tablet Take 1/2 tablet twice a day 90 tablet 2  . torsemide (DEMADEX) 20 MG tablet Take 20 mg by mouth daily as needed (EDEMA).     . vitamin E 200 UNIT capsule Take 200 Units by mouth daily.      Marland Kitchen ezetimibe (ZETIA) 10 MG tablet TAKE 1 TABLET (10 MG TOTAL) BY MOUTH DAILY. 90 tablet 3  . losartan (COZAAR) 50 MG tablet TAKE 1 TABLET (50 MG TOTAL) BY MOUTH DAILY. (Patient not taking: Reported on 03/16/2017) 90 tablet 2   No current facility-administered medications for this visit.     Allergies:   Codeine and Sulfonamide derivatives   Social History:  The patient  reports that he quit smoking about 12 years ago. His smoking use included cigarettes. He has a 38.00 pack-year smoking history. he has never used smokeless tobacco. He reports that  he does not drink alcohol or use drugs.   Family History:  The patient's family history includes AAA (abdominal aortic aneurysm) in his father; Breast cancer in his paternal aunt; Diabetes in his sister; Heart attack in his father; Heart disease in his father; Heart failure in his father; Hypertension in his mother; Stroke in his mother; Ulcers in his father.    ROS:  Please see the history of present illness.   Otherwise, review of systems is positive for fatigue, chest pain,  shortness of breath.   All other systems are reviewed and negative.    PHYSICAL EXAM: VS:  BP (!) 122/58   Pulse 70   Ht 5' 5.5" (1.664 m)   Wt 139 lb 6.4 oz (63.2 kg)   SpO2 99%   BMI 22.84 kg/m  , BMI Body mass index is 22.84 kg/m. GEN: Well nourished, well developed, in no acute distress  HEENT: normal  Neck: no JVD, carotid bruits, or masses Cardiac: iRRR; no murmurs, rubs, or gallops,no edema  Respiratory:  clear to auscultation bilaterally, normal work of breathing GI: soft, nontender, nondistended, + BS MS: no deformity or atrophy  Skin: warm and dry Neuro:  Strength and sensation are intact Psych: euthymic mood, full affect  EKG:  EKG is ordered today. Personal review of the ekg ordered shows AF, RBBB, LPFB, rate 70  Recent Labs: No results found for requested labs within last 8760 hours.    Lipid Panel     Component Value Date/Time   CHOL 172 08/01/2014 0852   TRIG 90 08/01/2014 0852   HDL 66 08/01/2014 0852   CHOLHDL 2.6 08/01/2014 0852   VLDL 18 08/01/2014 0852   LDLCALC 88 08/01/2014 0852     Wt Readings from Last 3 Encounters:  03/16/17 139 lb 6.4 oz (63.2 kg)  02/02/17 142 lb 9.6 oz (64.7 kg)  09/01/16 148 lb (67.1 kg)      Other studies Reviewed: Additional studies/ records that were reviewed today include: TTE 08/24/16  Review of the above records today demonstrates:  - Left ventricle: The cavity size was normal. Wall thickness was   increased in a pattern of moderate  LVH. Systolic function was   normal. The estimated ejection fraction was in the range of 55%   to 60%. Wall motion was normal; there were no regional wall   motion abnormalities. The study is not technically sufficient to   allow evaluation of LV diastolic function. - Mitral valve: Calcified annulus. Mildly thickened leaflets .   There was mild regurgitation. - Left atrium: The atrium was normal in size. - Right atrium: The atrium was mildly dilated. - Tricuspid valve: There was mild regurgitation. - Pulmonary arteries: PA peak pressure: 48 mm Hg (S). - Inferior vena cava: The vessel was dilated. The respirophasic   diameter changes were blunted (< 50%), consistent with elevated   central venous pressure.  Holter 06/17/16 - personally reviewed The predominant rhythm is atrial fibrillation.  The average heart rate is 82 bpm with a minimum heart rate of 50 to a maximum heart rate of 119 bpm.  There are frequent PVCs with several episodes of bigeminy and trigeminy and a very rare ventricular couplet.  There were no episodes of ventricular tachycardia.  There were no prolonged causes.  ASSESSMENT AND PLAN:  1.  Persistent atrial fibrillation: Currently on Eliquis, but has been on 5 mg daily in the past.  This is recently been increased to 5 mg twice a day.  He has been on amiodarone 200 mg twice a day and had side effects.  Unfortunately, his QTC has been prolonged and thus he would not tolerate dofetilide or sotalol.  I am concerned about his ablation risk due to the fact that he has significant coronary disease and his chronic stable angina.  I would like to discuss this with his primary cardiologist.  In the interim, it does appear that he had side effects on higher dose of amiodarone.  He may tolerate 200 mg  a day.  We will start him on amiodarone today and plan for cardioversion in 1 month after he has had long enough for a load of the drug.  This patients CHA2DS2-VASc Score and unadjusted  Ischemic Stroke Rate (% per year) is equal to 2.2 % stroke rate/year from a score of 2  Above score calculated as 1 point each if present [CHF, HTN, DM, Vascular=MI/PAD/Aortic Plaque, Age if 65-74, or Male] Above score calculated as 2 points each if present [Age > 75, or Stroke/TIA/TE]  2.  Coronary artery disease status post coronary bypass with chronic angina: Currently he has having chest pain mainly when he exerts himself.  This is been a chronic thing.  He is currently on optimal therapy.  No changes at this time.  Will defer to Dr. Claiborne Billings.    Current medicines are reviewed at length with the patient today.   The patient does not have concerns regarding his medicines.  The following changes were made today:  amiodarone  Labs/ tests ordered today include:  Orders Placed This Encounter  Procedures  . EKG 12-Lead     Disposition:   FU with Will Camnitz 3 months  Signed, Will Meredith Leeds, MD  03/16/2017 10:39 AM     [facility] [address] [place] [address] [phone] (office) [phone] (fax)

## 2017-03-21 ENCOUNTER — Other Ambulatory Visit: Payer: Self-pay | Admitting: Internal Medicine

## 2017-03-22 ENCOUNTER — Telehealth: Payer: Self-pay | Admitting: Cardiology

## 2017-03-22 NOTE — Telephone Encounter (Signed)
New message    Patient calling with concerns of taking amiodarone (PACERONE) 200 MG tablet and RANEXA 1000 MG SR tablet.   Pt c/o medication issue:  1. Name of Medication: amiodarone (PACERONE) 200 MG tablet  2. How are you currently taking this medication (dosage and times per day)?as prescribed  3. Are you having a reaction (difficulty breathing--STAT)? No  4. What is your medication issue? States the pharmacy would not fill medication until confirmation is given that it is safe for patient to take both medications.

## 2017-03-22 NOTE — Telephone Encounter (Signed)
Advised patient ok to take both medications.   Informed pt that I would call pharmacy and let them know ok to fill. Pt appreciative of my call and explanation.

## 2017-03-23 ENCOUNTER — Telehealth: Payer: Self-pay | Admitting: Internal Medicine

## 2017-03-24 MED ORDER — ESOMEPRAZOLE MAGNESIUM 40 MG PO CPDR: 40.0000 mg | DELAYED_RELEASE_CAPSULE | Freq: Two times a day (BID) | ORAL | 1 refills | Status: DC

## 2017-03-24 NOTE — Telephone Encounter (Signed)
Esomeprazole refilled. 

## 2017-03-28 ENCOUNTER — Encounter: Payer: Self-pay | Admitting: Family

## 2017-03-28 ENCOUNTER — Ambulatory Visit: Payer: Medicare Other | Admitting: Family

## 2017-03-28 ENCOUNTER — Ambulatory Visit (HOSPITAL_COMMUNITY)
Admission: RE | Admit: 2017-03-28 | Discharge: 2017-03-28 | Disposition: A | Discharge status: Home / Self Care | Payer: Medicare Other | Source: Ambulatory Visit | Attending: Family | Admitting: Family

## 2017-03-28 VITALS — BP 134/68 | HR 64 | Temp 97.0°F | Resp 16 | Ht 65.5 in | Wt 144.9 lb

## 2017-03-28 DIAGNOSIS — R609 Edema, unspecified: Secondary | ICD-10-CM | POA: Diagnosis not present

## 2017-03-28 DIAGNOSIS — I6523 Occlusion and stenosis of bilateral carotid arteries: Secondary | ICD-10-CM | POA: Insufficient documentation

## 2017-03-28 DIAGNOSIS — I771 Stricture of artery: Secondary | ICD-10-CM | POA: Diagnosis present

## 2017-03-28 DIAGNOSIS — Z9889 Other specified postprocedural states: Secondary | ICD-10-CM

## 2017-03-28 LAB — VAS US CAROTID
LCCADDIAS: -30 cm/s
LCCAPDIAS: 19 cm/s
LCCAPSYS: 120 cm/s
LEFT ECA DIAS: -16 cm/s
LEFT VERTEBRAL DIAS: 0 cm/s
LICADDIAS: -30 cm/s
LICAPSYS: 132 cm/s
Left CCA dist sys: -152 cm/s
Left ICA dist sys: -82 cm/s
Left ICA prox dias: 33 cm/s
RCCADSYS: -75 cm/s
RCCAPSYS: 202 cm/s
RIGHT CCA MID DIAS: 30 cm/s
RIGHT ECA DIAS: -26 cm/s
RIGHT VERTEBRAL DIAS: -18 cm/s
Right CCA prox dias: 37 cm/s

## 2017-03-28 NOTE — Progress Notes (Signed)
Chief Complaint: Follow up Extracranial Carotid Artery Stenosis   History of Present Illness  [name] is a 72 y.o. male who is s/p right carotid endarterectomy by Dr. Donnetta Hutching in 1996. He returns today for carotid artery surveillance and evaluaion. He does have a known moderate left carotid stenosis and left subclavian artery stenosis, and is being followed with serial ultrasounds.  He has a history of significant coronary disease with multiple prior stenting procedures and myocardial infarctions. He is extremely active in a walking program and is comfortable with this.  He does have known superficial femoral artery occlusive disease, but he no longer has claudication symptoms in his legs since he has been exercising so much.  He denies any history of stroke or TIA, specifically he denies a history of amaurosis fugax or monocular blindness,unilateralfacial drooping, hemiplegia, orreceptive or expressive aphasia.   He denies any steal symptoms in either upper extremity, denies cold sensation in either upper extremity, denies pain in either extremity. He denies feeling dizzy, but does feel off balance if he turns quickly; states he has tinnitus. He has been evaluated by an ENT in [facility] re this.   He also reports that he has known sacral, lumbar, and thoracic "bulging discs", degenerative disc disease, found on CT of his spine, per patient. He also did years of heavy lifting as an EMT. He also reports that he has scoliosis.  He had been doing cardiac rehab/wellness program at [facility] days/week since 2005, states this has helped his PAD, he no longer has claudication symptoms in his legs with walking. He stopped cardiac rehab due to chest pain with increased heart rate.  But he states his cardiologist said he could resume but not exercise hard enough to elicit chest pain.  Pt states that his cardiologist is monitoring his aorta, iliac arteries, renal arteries, and  checks ABI's regularly.   Pt states last chest pain episode was several days ago, had a cardiac stent placed in 2012; states in 1996 his LIMA was used as a bypass vessel. He had 4 vessel CABG in the 1980's and again in 1996.  He was diagnosed with paroxysmal atrial fib,takes amiodarone and metoprolol prescribed by Dr. Claiborne Billings; he is being considered for cardioversion by Dr. Curt Bears.   He reports dizziness since about 2015 or 2016 that is stable.   Pt Diabetic: No Pt smoker: former smoker, quit in 2006  Pt meds include: Statin : Yes ASA: no Other anticoagulants/antiplatelets: Eliquis for atrial fib   Past Medical History:  Diagnosis Date  . Anemia   . Angina   . Atrial fibrillation ([place])   . Blood transfusion   . Bronchitis   . Bulging discs    "8 of them; thoracic, lumbar, sacral area"  . Chronic back pain   . Colon polyps   . Complication of anesthesia   . Coronary artery disease 01/24/11   Successful PCI long segmental stensois  vein graft to the CX marginal vessel-graft previously stented prox. 3.0x24mm TAXUS stent mid seg 3.0x56mm TAXUS stent now tandem stens of 3.0x67mm & 3.0x1mm placed with the seg. 50,80 & 99% stenosis reduced to 0%  . DDD (degenerative disc disease)   . Dysrhythmia    "PAC's and PVC's"  . Esophageal dilatation    "have had it done 7 times; last time ~ 2001"  . Esophageal stricture   . GERD (gastroesophageal reflux disease)   . Headache(784.0)   . Hiatal hernia   . Hyperlipidemia   .  Myocardial infarction ([place]) 1991  . Myocardial infarction [facility]) 2005   "had 3 heart attacks this year"  . Paroxysmal atrial fibrillation (HCC)   . Pyloric stenosis   . RBBB   . Renal artery stenosis (H[facility])    hhistory of right renal artery stenting and re-intervention for "in-stent restenosis.  . S/P CABG (coronary artery bypass graft) [place]  . Shortness of breath    "when I had the heart attacks"  . Subclavian  artery stenosis, left ([place])   . Ulcer    "small; Dr. Henrene Pastor found it 02/2010"    Social History Social History   Tobacco Use  . Smoking status: Former Smoker    Packs/day: 1.00    Years: 38.00    Pack years: 38.00    Types: Cigarettes    Last attempt to quit: 02/09/2005    Years since quitting: 12.1  . Smokeless tobacco: Never Used  . Tobacco comment: "quit smoking cigarrettes 2006"  Substance Use Topics  . Alcohol use: No    Alcohol/week: 0.0 oz  . Drug use: No    Family History Family History  Problem Relation Age of Onset  . Breast cancer Paternal Aunt   . Diabetes Sister   . Ulcers Father   . Heart disease Father   . Heart failure Father   . Heart attack Father   . AAA (abdominal aortic aneurysm) Father   . Stroke Mother   . Hypertension Mother   . Colon cancer Neg Hx     Surgical History Past Surgical History:  Procedure Laterality Date  . ADENOIDECTOMY     "when I was an infant"  . APPENDECTOMY  1953  . CAROTID ENDARTERECTOMY Right 1996   CE  . CORONARY ANGIOPLASTY WITH STENT PLACEMENT  01/24/11   "today makes a total of 9 stents"  . CORONARY ARTERY BYPASS GRAFT  1984   CABG X 4  . CORONARY ARTERY BYPASS GRAFT  1996   CABG X 4  . LEFT HEART CATHETERIZATION WITH CORONARY/GRAFT ANGIOGRAM N/A 01/24/2011   Procedure: LEFT HEART CATHETERIZATION WITH Beatrix Fetters;  Surgeon: Troy Sine, MD;  Location[facility] C[facility] LAB;  Service: Cardiovascular;  Laterality: N/A;  . pylondial cyst removal    . RETINAL DETACHMENT SURGERY  04/2007   right  . TONSILLECTOMY  1972    Allergies  Allergen Reactions  . Codeine Nausea And Vomiting  . Sulfonamide Derivatives Other (See Comments)    REACTION: rash    Current Outpatient Medications  Medication Sig Dispense Refill  . amiodarone (PACERONE) 200 MG tablet Take 1 tablet (200 mg total) by mouth daily. 30 tablet 3  . butalbital-aspirin-caffeine (FIORINAL) 50-325-40 MG per capsule Take 1 capsule by mouth every  4 (four) hours as needed. FOR HEADACHE.     Marland Kitchen Coenzyme Q10 (CO Q-10) 100 MG CAPS Take 1 tablet by mouth daily.     Marland Kitchen dutasteride (AVODART) 0.5 MG capsule Take 0.5 mg by mouth daily.     Marland Kitchen ELIQUIS 5 MG TABS tablet TAKE 1 TABLET BY MOUTH TWICE A DAY 60 tablet 3  . esomeprazole (NEXIUM) 40 MG capsule Take 1 capsule (40 mg total) by mouth 2 (two) times daily. 180 capsule 1  . loratadine (CLARITIN) 10 MG tablet Take 1 tablet by mouth daily as needed for allergies, rhinitis or itching.   3  . lovastatin (MEVACOR) 40 MG tablet TAKE 1 TABLET BY MOUTH AT BEDTIME 90 tablet  1  . metoprolol tartrate (LOPRESSOR) 50 MG tablet Take 75 mg by mouth 2 (two) times daily.    . Multiple Vitamins-Minerals (MULTIVITAMINS THER. W/MINERALS) TABS Take 1 tablet by mouth daily.      . nitroGLYCERIN (NITROLINGUAL) 0.4 MG/SPRAY spray PLACE ONE SPRAY UNDER TONGUE EVERY 5 MINUITES FOR UP TO 3 DOSES AS NEEDED FOR CHEST PAIN 4.9 g 3  . RANEXA 1000 MG SR tablet TAKE 1 TABLET BY MOUTH TWICE DAILY 180 tablet 1  . torsemide (DEMADEX) 20 MG tablet Take 20 mg by mouth daily as needed (EDEMA).     . vitamin E 200 UNIT capsule Take 200 Units by mouth daily.      Marland Kitchen ezetimibe (ZETIA) 10 MG tablet TAKE 1 TABLET (10 MG TOTAL) BY MOUTH DAILY. 90 tablet 3  . losartan (COZAAR) 50 MG tablet TAKE 1 TABLET (50 MG TOTAL) BY MOUTH DAILY. (Patient not taking: Reported on 03/28/2017) 90 tablet 2   No current facility-administered medications for this visit.     Review of Systems : See HPI for pertinent positives and negatives.  Physical Examination  Vitals:   03/28/17 1451 03/28/17 1454  BP: 90/67 134/68  Pulse: 64   Resp: 16   Temp: (!) 97 F (36.1 C)   TempSrc: Oral   SpO2: 98%   Weight: 144 lb 14.4 oz (65.7 kg)   Height: 5' 5.5" (1.664 m)    Body mass index is 23.75 kg/m.  General: WDWN male in NAD GAIT:normal Eyes: PERRLA HENT: No gross abnormalities  Pulmonary: Respirations are non-labored, limited air movement in all  fields, no rales, rhonchi, or wheezes.  Cardiac: Irregular rhythm, controlled rate, no detected murmur.  VASCULAR EXAM Carotid Bruits Right Left   Positive Positive    Abdominal aortic pulse is moderatly palpable. Radial pulses are 2+ right and 1+ left palpable.      LE Pulses Right Left   FEMORAL 2+ palpable 1+ palpable    POPLITEAL not palpable  not palpable   POSTERIOR TIBIAL not palpable  not palpable    DORSALIS PEDIS  ANTERIOR TIBIAL not palpable  not palpable     Gastrointestinal: soft, nontender, BS WNL, no r/g,no palpable masses.  Musculoskeletal: Nomuscle atrophy/wasting. M/S 5/5 throughout, Extremities without ischemic changes. Moderate cervical/thoracic kyphosis. 1+right and 2+ left ankle and pretibial pitting edema. Skin: No rash, no cellulitis, no ulcers noted.  Neurologic: A&O X 3; appropriate affect, speech is normal, CN 2-12 intact, Pain and light touch intact in extremities, Motor exam as listed above      Psychiatric: Normal thought content, mood appropriate to clinical situation.       Assessment: BRAY VICKERMAN is a 72 y.o. male who is s/p right carotid endarterectomy in 1996 and has known left subclavian artery stenosis. He has no remote nor recent history of stroke or TIA. He has no steal symptoms in either upper extremity.   He denies claudication symptoms with walking. There are no signs of ischemia in his feet or legs.   He has mild dependent edema in his lower legs that mostly resolves with overnight elevation of his legs. He wears knee high graduated stockings as advised.  Pt states that his cardiologist is monitoring his aorta, iliac arteries, and renal arteries.  DATA Carotid Duplex (03/28/17): 40-59% rightICA stenoses (CEA site), and <40% left ICA  stenosis.  Disturbed waveforms left subclavian artery and retrograde left vertebral artery flow; greater than 20 mm Hg difference in the bilateral brachial pressures. Right  vertebral artery flow is antegrade, right subclavian artery waveforms are normal. No significant change from exam on 06-21-16.    Plan: Follow-up in 1 yearwith Carotid Duplex scan.   I discussed in depth with the patient the nature of atherosclerosis, and emphasized the importance of maximal medical management including strict control of blood pressure, blood glucose, and lipid levels, obtaining regular exercise, and continued cessation of smoking.  The patient is aware that without maximal medical management the underlying atherosclerotic disease process will progress, limiting the benefit of any interventions. The patient was given information about stroke prevention and what symptoms should prompt the patient to seek immediate medical care. Thank you for allowing [place] to participate in this patient's care.  Clemon Chambers, RN, MSN, FNP-C [facility] Office: [phone]  Clinic Physician: Scot Dock  03/28/17 2:58 PM

## 2017-03-28 NOTE — Patient Instructions (Signed)
Stroke Prevention Some health problems and behaviors may make it more likely for you to have a stroke. Below are ways to lessen your risk of having a stroke.  Be active for at least 30 minutes on most or all days.  Do not smoke. Try not to be around others who smoke.  Do not drink too much alcohol. ? Do not have more than 2 drinks a day if you are a man. ? Do not have more than 1 drink a day if you are a woman and are not pregnant.  Eat healthy foods, such as fruits and vegetables. If you were put on a specific diet, follow the diet as told.  Keep your cholesterol levels under control through diet and medicines. Look for foods that are low in saturated fat, trans fat, cholesterol, and are high in fiber.  If you have diabetes, follow all diet plans and take your medicine as told.  Ask your doctor if you need treatment to lower your blood pressure. If you have high blood pressure (hypertension), follow all diet plans and take your medicine as told by your doctor.  If you are 60-48 years old, have your blood pressure checked every 3-5 years. If you are age 24 or older, have your blood pressure checked every year.  Keep a healthy weight. Eat foods that are low in calories, salt, saturated fat, trans fat, and cholesterol.  Do not take drugs.  Avoid birth control pills, if this applies. Talk to your doctor about the risks of taking birth control pills.  Talk to your doctor if you have sleep problems (sleep apnea).  Take all medicine as told by your doctor. ? You may be told to take aspirin or blood thinner medicine. Take this medicine as told by your doctor. ? Understand your medicine instructions.  Make sure any other conditions you have are being taken care of.  Get help right away if:  You suddenly lose feeling (you feel numb) or have weakness in your face, arm, or leg.  Your face or eyelid hangs down to one side.  You suddenly feel confused.  You have trouble talking  (aphasia) or understanding what people are saying.  You suddenly have trouble seeing in one or both eyes.  You suddenly have trouble walking.  You are dizzy.  You lose your balance or your movements are clumsy (uncoordinated).  You suddenly have a very bad headache and you do not know the cause.  You have new chest pain.  Your heart feels like it is fluttering or skipping a beat (irregular heartbeat). Do not wait to see if the symptoms above go away. Get help right away. Call your local emergency services (911 in [place] Do not drive yourself to the hospital. This information is not intended to replace advice given to you by your health care provider. Make sure you discuss any questions you have with your health care provider. Document Released: 08/23/2011 Document Revised: 07/30/2015 Document Reviewed: 08/24/2012 Elsevier Interactive Patient Education  2018 [employer].     Edema Edema is when you have too much fluid in your body or under your skin. Edema may make your legs, feet, and ankles swell up. Swelling is also common in looser tissues, like around your eyes. This is a common condition. It gets more common as you get older. There are many possible causes of edema. Eating too much salt (sodium) and being on your feet or sitting for a long time can cause edema in  your legs, feet, and ankles. Hot weather may make edema worse. Edema is usually painless. Your skin may look swollen or shiny. Follow these instructions at home:  Keep the swollen body part raised (elevated) above the level of your heart when you are sitting or lying down.  Do not sit still or stand for a long time.  Do not wear tight clothes. Do not wear garters on your upper legs.  Exercise your legs. This can help the swelling go down.  Wear elastic bandages or support stockings as told by your doctor.  Eat a low-salt (low-sodium) diet to reduce fluid as told by your doctor.  Depending on the cause of your  swelling, you may need to limit how much fluid you drink (fluid restriction).  Take over-the-counter and prescription medicines only as told by your doctor. Contact a doctor if:  Treatment is not working.  You have heart, liver, or kidney disease and have symptoms of edema.  You have sudden and unexplained weight gain. Get help right away if:  You have shortness of breath or chest pain.  You cannot breathe when you lie down.  You have pain, redness, or warmth in the swollen areas.  You have heart, liver, or kidney disease and get edema all of a sudden.  You have a fever and your symptoms get worse all of a sudden. Summary  Edema is when you have too much fluid in your body or under your skin.  Edema may make your legs, feet, and ankles swell up. Swelling is also common in looser tissues, like around your eyes.  Raise (elevate) the swollen body part above the level of your heart when you are sitting or lying down.  Follow your doctor's instructions about diet and how much fluid you can drink (fluid restriction). This information is not intended to replace advice given to you by your health care provider. Make sure you discuss any questions you have with your health care provider. Document Released: 08/10/2007 Document Revised: 03/11/2016 Document Reviewed: 03/11/2016 Elsevier Interactive Patient Education  2017 [employer].

## 2017-03-28 NOTE — H&P (View-Only) (Signed)
Chief Complaint: Follow up Extracranial Carotid Artery Stenosis   History of Present Illness  [name] is a 72 y.o. male who is s/p right carotid endarterectomy by Dr. Donnetta Hutching in 1996. He returns today for carotid artery surveillance and evaluaion. He does have a known moderate left carotid stenosis and left subclavian artery stenosis, and is being followed with serial ultrasounds.  He has a history of significant coronary disease with multiple prior stenting procedures and myocardial infarctions. He is extremely active in a walking program and is comfortable with this.  He does have known superficial femoral artery occlusive disease, but he no longer has claudication symptoms in his legs since he has been exercising so much.  He denies any history of stroke or TIA, specifically he denies a history of amaurosis fugax or monocular blindness,unilateralfacial drooping, hemiplegia, orreceptive or expressive aphasia.   He denies any steal symptoms in either upper extremity, denies cold sensation in either upper extremity, denies pain in either extremity. He denies feeling dizzy, but does feel off balance if he turns quickly; states he has tinnitus. He has been evaluated by an ENT in [facility] re this.   He also reports that he has known sacral, lumbar, and thoracic "bulging discs", degenerative disc disease, found on CT of his spine, per patient. He also did years of heavy lifting as an EMT. He also reports that he has scoliosis.  He had been doing cardiac rehab/wellness program at [facility] days/week since 2005, states this has helped his PAD, he no longer has claudication symptoms in his legs with walking. He stopped cardiac rehab due to chest pain with increased heart rate.  But he states his cardiologist said he could resume but not exercise hard enough to elicit chest pain.  Pt states that his cardiologist is monitoring his aorta, iliac arteries, renal arteries, and  checks ABI's regularly.   Pt states last chest pain episode was several days ago, had a cardiac stent placed in 2012; states in 1996 his LIMA was used as a bypass vessel. He had 4 vessel CABG in the 1980's and again in 1996.  He was diagnosed with paroxysmal atrial fib,takes amiodarone and metoprolol prescribed by Dr. Claiborne Billings; he is being considered for cardioversion by Dr. Curt Bears.   He reports dizziness since about 2015 or 2016 that is stable.   Pt Diabetic: No Pt smoker: former smoker, quit in 2006  Pt meds include: Statin : Yes ASA: no Other anticoagulants/antiplatelets: Eliquis for atrial fib   Past Medical History:  Diagnosis Date  . Anemia   . Angina   . Atrial fibrillation ([place])   . Blood transfusion   . Bronchitis   . Bulging discs    "8 of them; thoracic, lumbar, sacral area"  . Chronic back pain   . Colon polyps   . Complication of anesthesia   . Coronary artery disease 01/24/11   Successful PCI long segmental stensois  vein graft to the CX marginal vessel-graft previously stented prox. 3.0x51mm TAXUS stent mid seg 3.0x22mm TAXUS stent now tandem stens of 3.0x20mm & 3.0x23mm placed with the seg. 50,80 & 99% stenosis reduced to 0%  . DDD (degenerative disc disease)   . Dysrhythmia    "PAC's and PVC's"  . Esophageal dilatation    "have had it done 7 times; last time ~ 2001"  . Esophageal stricture   . GERD (gastroesophageal reflux disease)   . Headache(784.0)   . Hiatal hernia   . Hyperlipidemia   .  Myocardial infarction ([place]) 1991  . Myocardial infarction [facility]) 2005   "had 3 heart attacks this year"  . Paroxysmal atrial fibrillation (HCC)   . Pyloric stenosis   . RBBB   . Renal artery stenosis (H[facility])    hhistory of right renal artery stenting and re-intervention for "in-stent restenosis.  . S/P CABG (coronary artery bypass graft) [place]  . Shortness of breath    "when I had the heart attacks"  . Subclavian  artery stenosis, left ([place])   . Ulcer    "small; Dr. Henrene Pastor found it 02/2010"    Social History Social History   Tobacco Use  . Smoking status: Former Smoker    Packs/day: 1.00    Years: 38.00    Pack years: 38.00    Types: Cigarettes    Last attempt to quit: 02/09/2005    Years since quitting: 12.1  . Smokeless tobacco: Never Used  . Tobacco comment: "quit smoking cigarrettes 2006"  Substance Use Topics  . Alcohol use: No    Alcohol/week: 0.0 oz  . Drug use: No    Family History Family History  Problem Relation Age of Onset  . Breast cancer Paternal Aunt   . Diabetes Sister   . Ulcers Father   . Heart disease Father   . Heart failure Father   . Heart attack Father   . AAA (abdominal aortic aneurysm) Father   . Stroke Mother   . Hypertension Mother   . Colon cancer Neg Hx     Surgical History Past Surgical History:  Procedure Laterality Date  . ADENOIDECTOMY     "when I was an infant"  . APPENDECTOMY  1953  . CAROTID ENDARTERECTOMY Right 1996   CE  . CORONARY ANGIOPLASTY WITH STENT PLACEMENT  01/24/11   "today makes a total of 9 stents"  . CORONARY ARTERY BYPASS GRAFT  1984   CABG X 4  . CORONARY ARTERY BYPASS GRAFT  1996   CABG X 4  . LEFT HEART CATHETERIZATION WITH CORONARY/GRAFT ANGIOGRAM N/A 01/24/2011   Procedure: LEFT HEART CATHETERIZATION WITH Beatrix Fetters;  Surgeon: Troy Sine, MD;  Location: [facility] CATH LAB;  Service: Cardiovascular;  Laterality: N/A;  . pylondial cyst removal    . RETINAL DETACHMENT SURGERY  04/2007   right  . TONSILLECTOMY  1972    Allergies  Allergen Reactions  . Codeine Nausea And Vomiting  . Sulfonamide Derivatives Other (See Comments)    REACTION: rash    Current Outpatient Medications  Medication Sig Dispense Refill  . amiodarone (PACERONE) 200 MG tablet Take 1 tablet (200 mg total) by mouth daily. 30 tablet 3  . butalbital-aspirin-caffeine (FIORINAL) 50-325-40 MG per capsule Take 1 capsule by mouth every  4 (four) hours as needed. FOR HEADACHE.     Marland Kitchen Coenzyme Q10 (CO Q-10) 100 MG CAPS Take 1 tablet by mouth daily.     Marland Kitchen dutasteride (AVODART) 0.5 MG capsule Take 0.5 mg by mouth daily.     Marland Kitchen ELIQUIS 5 MG TABS tablet TAKE 1 TABLET BY MOUTH TWICE A DAY 60 tablet 3  . esomeprazole (NEXIUM) 40 MG capsule Take 1 capsule (40 mg total) by mouth 2 (two) times daily. 180 capsule 1  . loratadine (CLARITIN) 10 MG tablet Take 1 tablet by mouth daily as needed for allergies, rhinitis or itching.   3  . lovastatin (MEVACOR) 40 MG tablet TAKE 1 TABLET BY MOUTH AT BEDTIME 90 tablet  1  . metoprolol tartrate (LOPRESSOR) 50 MG tablet Take 75 mg by mouth 2 (two) times daily.    . Multiple Vitamins-Minerals (MULTIVITAMINS THER. W/MINERALS) TABS Take 1 tablet by mouth daily.      . nitroGLYCERIN (NITROLINGUAL) 0.4 MG/SPRAY spray PLACE ONE SPRAY UNDER TONGUE EVERY 5 MINUITES FOR UP TO 3 DOSES AS NEEDED FOR CHEST PAIN 4.9 g 3  . RANEXA 1000 MG SR tablet TAKE 1 TABLET BY MOUTH TWICE DAILY 180 tablet 1  . torsemide (DEMADEX) 20 MG tablet Take 20 mg by mouth daily as needed (EDEMA).     . vitamin E 200 UNIT capsule Take 200 Units by mouth daily.      Marland Kitchen ezetimibe (ZETIA) 10 MG tablet TAKE 1 TABLET (10 MG TOTAL) BY MOUTH DAILY. 90 tablet 3  . losartan (COZAAR) 50 MG tablet TAKE 1 TABLET (50 MG TOTAL) BY MOUTH DAILY. (Patient not taking: Reported on 03/28/2017) 90 tablet 2   No current facility-administered medications for this visit.     Review of Systems : See HPI for pertinent positives and negatives.  Physical Examination  Vitals:   03/28/17 1451 03/28/17 1454  BP: 90/67 134/68  Pulse: 64   Resp: 16   Temp: (!) 97 F (36.1 C)   TempSrc: Oral   SpO2: 98%   Weight: 144 lb 14.4 oz (65.7 kg)   Height: 5' 5.5" (1.664 m)    Body mass index is 23.75 kg/m.  General: WDWN male in NAD GAIT:normal Eyes: PERRLA HENT: No gross abnormalities  Pulmonary: Respirations are non-labored, limited air movement in all  fields, no rales, rhonchi, or wheezes.  Cardiac: Irregular rhythm, controlled rate, no detected murmur.  VASCULAR EXAM Carotid Bruits Right Left   Positive Positive    Abdominal aortic pulse is moderatly palpable. Radial pulses are 2+ right and 1+ left palpable.      LE Pulses Right Left   FEMORAL 2+ palpable 1+ palpable    POPLITEAL not palpable  not palpable   POSTERIOR TIBIAL not palpable  not palpable    DORSALIS PEDIS  ANTERIOR TIBIAL not palpable  not palpable     Gastrointestinal: soft, nontender, BS WNL, no r/g,no palpable masses.  Musculoskeletal: Nomuscle atrophy/wasting. M/S 5/5 throughout, Extremities without ischemic changes. Moderate cervical/thoracic kyphosis. 1+right and 2+ left ankle and pretibial pitting edema. Skin: No rash, no cellulitis, no ulcers noted.  Neurologic: A&O X 3; appropriate affect, speech is normal, CN 2-12 intact, Pain and light touch intact in extremities, Motor exam as listed above      Psychiatric: Normal thought content, mood appropriate to clinical situation.       Assessment: [name] is a 72 y.o. male who is s/p right carotid endarterectomy in 1996 and has known left subclavian artery stenosis. He has no remote nor recent history of stroke or TIA. He has no steal symptoms in either upper extremity.   He denies claudication symptoms with walking. There are no signs of ischemia in his feet or legs.   He has mild dependent edema in his lower legs that mostly resolves with overnight elevation of his legs. He wears knee high graduated stockings as advised.  Pt states that his cardiologist is monitoring his aorta, iliac arteries, and renal arteries.  DATA Carotid Duplex (03/28/17): 40-59% rightICA stenoses (CEA site), and <40% left ICA  stenosis.  Disturbed waveforms left subclavian artery and retrograde left vertebral artery flow; greater than 20 mm Hg difference in the bilateral brachial pressures. Right  vertebral artery flow is antegrade, right subclavian artery waveforms are normal. No significant change from exam on 06-21-16.    Plan: Follow-up in 1 yearwith Carotid Duplex scan.   I discussed in depth with the patient the nature of atherosclerosis, and emphasized the importance of maximal medical management including strict control of blood pressure, blood glucose, and lipid levels, obtaining regular exercise, and continued cessation of smoking.  The patient is aware that without maximal medical management the underlying atherosclerotic disease process will progress, limiting the benefit of any interventions. The patient was given information about stroke prevention and what symptoms should prompt the patient to seek immediate medical care. Thank you for allowing [place] to participate in this patient's care.  Clemon Chambers, RN, MSN, FNP-C [facility] Office: [phone]  Clinic Physician: Scot Dock  03/28/17 2:58 PM

## 2017-04-10 ENCOUNTER — Telehealth: Payer: Self-pay | Admitting: *Deleted

## 2017-04-10 MED ORDER — AMIODARONE HCL 200 MG PO TABS: 100.0000 mg | ORAL_TABLET | Freq: Every day | ORAL | 3 refills | Status: DC

## 2017-04-10 NOTE — Telephone Encounter (Signed)
Spoke to wife on 2/1 @ 5:30 pm about scheduling DCCV post re-initation of Amiodarone. She reports pt is still having tremors, falls and SOB on 200 mg dosing.  Will discuss with Camnitz next week and let them know recommendation/s. Also pt ok for DCCV on 2/11 if can arrange.

## 2017-04-10 NOTE — Telephone Encounter (Signed)
Spoke to husband this evening. He will decrease Amiodarone to 100 mg tonight. He is agreeable to DCCV on 2/11. He is aware I will make arrangements and call by Wednesday/Thursday.

## 2017-04-12 NOTE — Telephone Encounter (Signed)
lmtcb  to review DCCV instructions

## 2017-04-13 ENCOUNTER — Encounter: Payer: Self-pay | Admitting: *Deleted

## 2017-04-13 ENCOUNTER — Other Ambulatory Visit: Payer: Self-pay | Admitting: Cardiology

## 2017-04-13 NOTE — Telephone Encounter (Signed)
F/U call: ° °Patient returning your call  °

## 2017-04-13 NOTE — Telephone Encounter (Signed)
DCCV instructions reviewed w/ patient and letter sent via [facility]. Follow up appts made. Patient verbalized understanding and agreeable to plan.

## 2017-04-17 ENCOUNTER — Encounter (HOSPITAL_COMMUNITY)
Admission: RE | Disposition: A | Discharge status: Home / Self Care | Payer: Self-pay | Source: Ambulatory Visit | Attending: Cardiology

## 2017-04-17 ENCOUNTER — Ambulatory Visit (HOSPITAL_COMMUNITY): Payer: Medicare Other | Admitting: Anesthesiology

## 2017-04-17 ENCOUNTER — Encounter (HOSPITAL_COMMUNITY): Payer: Self-pay | Admitting: *Deleted

## 2017-04-17 ENCOUNTER — Ambulatory Visit (HOSPITAL_COMMUNITY)
Admission: RE | Admit: 2017-04-17 | Discharge: 2017-04-17 | Disposition: A | Discharge status: Home / Self Care | Payer: Medicare Other | Source: Ambulatory Visit | Attending: Cardiology | Admitting: Cardiology

## 2017-04-17 DIAGNOSIS — Z87891 Personal history of nicotine dependence: Secondary | ICD-10-CM | POA: Insufficient documentation

## 2017-04-17 DIAGNOSIS — I480 Paroxysmal atrial fibrillation: Secondary | ICD-10-CM | POA: Diagnosis not present

## 2017-04-17 DIAGNOSIS — I10 Essential (primary) hypertension: Secondary | ICD-10-CM | POA: Insufficient documentation

## 2017-04-17 DIAGNOSIS — Z79899 Other long term (current) drug therapy: Secondary | ICD-10-CM | POA: Insufficient documentation

## 2017-04-17 DIAGNOSIS — Z955 Presence of coronary angioplasty implant and graft: Secondary | ICD-10-CM | POA: Diagnosis not present

## 2017-04-17 DIAGNOSIS — Z951 Presence of aortocoronary bypass graft: Secondary | ICD-10-CM | POA: Insufficient documentation

## 2017-04-17 DIAGNOSIS — K219 Gastro-esophageal reflux disease without esophagitis: Secondary | ICD-10-CM | POA: Diagnosis not present

## 2017-04-17 DIAGNOSIS — I252 Old myocardial infarction: Secondary | ICD-10-CM | POA: Insufficient documentation

## 2017-04-17 DIAGNOSIS — E785 Hyperlipidemia, unspecified: Secondary | ICD-10-CM | POA: Diagnosis not present

## 2017-04-17 DIAGNOSIS — Z8249 Family history of ischemic heart disease and other diseases of the circulatory system: Secondary | ICD-10-CM | POA: Diagnosis not present

## 2017-04-17 DIAGNOSIS — I2510 Atherosclerotic heart disease of native coronary artery without angina pectoris: Secondary | ICD-10-CM | POA: Insufficient documentation

## 2017-04-17 DIAGNOSIS — I739 Peripheral vascular disease, unspecified: Secondary | ICD-10-CM | POA: Diagnosis not present

## 2017-04-17 DIAGNOSIS — Z7901 Long term (current) use of anticoagulants: Secondary | ICD-10-CM | POA: Diagnosis not present

## 2017-04-17 DIAGNOSIS — Z823 Family history of stroke: Secondary | ICD-10-CM | POA: Diagnosis not present

## 2017-04-17 DIAGNOSIS — I6522 Occlusion and stenosis of left carotid artery: Secondary | ICD-10-CM | POA: Insufficient documentation

## 2017-04-17 DIAGNOSIS — I4510 Unspecified right bundle-branch block: Secondary | ICD-10-CM | POA: Insufficient documentation

## 2017-04-17 HISTORY — PX: CARDIOVERSION: SHX1299

## 2017-04-17 LAB — POCT I-STAT, CHEM 8
BUN: 26 mg/dL — ABNORMAL HIGH (ref 6–20)
CHLORIDE: 108 mmol/L (ref 101–111)
CREATININE: 1 mg/dL (ref 0.61–1.24)
Calcium, Ion: 1.14 mmol/L — ABNORMAL LOW (ref 1.15–1.40)
GLUCOSE: 96 mg/dL (ref 65–99)
HCT: 34 % — ABNORMAL LOW (ref 39.0–52.0)
Hemoglobin: 11.6 g/dL — ABNORMAL LOW (ref 13.0–17.0)
Potassium: 4.5 mmol/L (ref 3.5–5.1)
Sodium: 140 mmol/L (ref 135–145)
TCO2: 22 mmol/L (ref 22–32)

## 2017-04-17 SURGERY — CARDIOVERSION
Anesthesia: General

## 2017-04-17 MED ORDER — SODIUM CHLORIDE 0.9 % IV SOLN
INTRAVENOUS | Status: DC | PRN
  Administered 2017-04-17: 12:00:00 via INTRAVENOUS

## 2017-04-17 MED ORDER — PROPOFOL 10 MG/ML IV BOLUS
INTRAVENOUS | Status: DC | PRN
  Administered 2017-04-17: 60 mg via INTRAVENOUS

## 2017-04-17 MED ORDER — LIDOCAINE 2% (20 MG/ML) 5 ML SYRINGE
INTRAMUSCULAR | Status: DC | PRN
  Administered 2017-04-17: 80 mg via INTRAVENOUS

## 2017-04-17 NOTE — Anesthesia Preprocedure Evaluation (Signed)
Anesthesia Evaluation  Patient identified by MRN, date of birth, ID band Patient awake    Reviewed: Allergy & Precautions, H&P , NPO status , Patient's Chart, lab work & pertinent test results  Airway Mallampati: III  TM Distance: >3 FB Neck ROM: Full    Dental no notable dental hx. (+) Edentulous Upper, Partial Lower, Dental Advisory Given   Pulmonary neg pulmonary ROS, former smoker,    Pulmonary exam normal breath sounds clear to auscultation       Cardiovascular hypertension, Pt. on medications and Pt. on home beta blockers + CAD, + Past MI, + Cardiac Stents, + CABG and + Peripheral Vascular Disease  + dysrhythmias Atrial Fibrillation  Rhythm:Irregular Rate:Normal     Neuro/Psych  Headaches, negative psych ROS   GI/Hepatic Neg liver ROS, GERD  Medicated and Controlled,  Endo/Other  negative endocrine ROS  Renal/GU negative Renal ROS  negative genitourinary   Musculoskeletal  (+) Arthritis , Osteoarthritis,    Abdominal   Peds  Hematology negative hematology ROS (+) anemia ,   Anesthesia Other Findings   Reproductive/Obstetrics negative OB ROS                             Anesthesia Physical Anesthesia Plan  ASA: III  Anesthesia Plan: General   Post-op Pain Management:    Induction: Intravenous  PONV Risk Score and Plan: 2 and Propofol infusion and Treatment may vary due to age or medical condition  Airway Management Planned: Nasal Cannula  Additional Equipment:   Intra-op Plan:   Post-operative Plan:   Informed Consent: I have reviewed the patients History and Physical, chart, labs and discussed the procedure including the risks, benefits and alternatives for the proposed anesthesia with the patient or authorized representative who has indicated his/her understanding and acceptance.   Dental advisory given  Plan Discussed with: CRNA  Anesthesia Plan Comments:          Anesthesia Quick Evaluation

## 2017-04-17 NOTE — Interval H&P Note (Signed)
History and Physical Interval Note:  04/17/2017 10:21 AM  [name]  has presented today for surgery, with the diagnosis of AFIB  The various methods of treatment have been discussed with the patient and family. After consideration of risks, benefits and other options for treatment, the patient has consented to  Procedure(s): CARDIOVERSION (N/A) as a surgical intervention .  The patient's history has been reviewed, patient examined, no change in status, stable for surgery.  I have reviewed the patient's chart and labs.  Questions were answered to the patient's satisfaction.     [employer]

## 2017-04-17 NOTE — Transfer of Care (Signed)
Immediate Anesthesia Transfer of Care Note  Patient: [name]  Procedure(s) Performed: CARDIOVERSION (N/A )  Patient Location: Endoscopy Unit  Anesthesia Type:General  Level of Consciousness: awake and patient cooperative  Airway & Oxygen Therapy: Patient Spontanous Breathing  Post-op Assessment: Report given to RN and Post -op Vital signs reviewed and stable  Post vital signs: Reviewed and stable  Last Vitals:  Vitals:   04/17/17 1135 04/17/17 1136  BP:  (!) 128/52  Pulse: (!) 58 (!) 59  Resp: 17 17  Temp:    SpO2: 96% 93%    Last Pain:  Vitals:   04/17/17 1024  TempSrc: Oral         Complications: No apparent anesthesia complications

## 2017-04-17 NOTE — CV Procedure (Signed)
    Electrical Cardioversion Procedure Note [name] [id number] May 10, 1945  Procedure: Electrical Cardioversion Indications:  Atrial Fibrillation  Time Out: Verified patient identification, verified procedure,medications/allergies/relevent history reviewed, required imaging and test results available.  Performed  Procedure Details  The patient was NPO after midnight. Anesthesia was administered at the beside  by Dr.Miller with 60mg  of propofol.  Cardioversion was performed with synchronized biphasic defibrillation via AP pads with 120 joules.  1 attempt(s) were performed.  The patient converted to normal sinus rhythm. The patient tolerated the procedure well   IMPRESSION:  Successful cardioversion of atrial fibrillation on low dose amiodarone.     [name] 04/17/2017, 11:39 AM

## 2017-04-17 NOTE — Anesthesia Procedure Notes (Signed)
Procedure Name: General with mask airway Date/Time: 04/17/2017 11:32 AM Performed by: Lance Coon, CRNA Pre-anesthesia Checklist: Patient identified, Emergency Drugs available, Suction available, Patient being monitored and Timeout performed Patient Re-evaluated:Patient Re-evaluated prior to induction Oxygen Delivery Method: Ambu bag Preoxygenation: Pre-oxygenation with 100% oxygen Induction Type: IV induction Ventilation: Mask ventilation without difficulty

## 2017-04-17 NOTE — Anesthesia Postprocedure Evaluation (Signed)
Anesthesia Post Note  Patient: [name]  Procedure(s) Performed: CARDIOVERSION (N/A )     Patient location during evaluation: PACU Anesthesia Type: General Level of consciousness: awake and alert Pain management: pain level controlled Vital Signs Assessment: post-procedure vital signs reviewed and stable Respiratory status: spontaneous breathing, nonlabored ventilation and respiratory function stable Cardiovascular status: blood pressure returned to baseline and stable Postop Assessment: no apparent nausea or vomiting Anesthetic complications: no    Last Vitals:  Vitals:   04/17/17 1136 04/17/17 1140  BP: (!) 128/52 (!) 112/49  Pulse: (!) 59 60  Resp: 17 20  Temp:    SpO2: 93% 93%    Last Pain:  Vitals:   04/17/17 1024  TempSrc: Oral                 Anup Brigham,W. EDMOND

## 2017-04-17 NOTE — Discharge Instructions (Signed)
Electrical Cardioversion, Care After °This sheet gives you information about how to care for yourself after your procedure. Your health care provider may also give you more specific instructions. If you have problems or questions, contact your health care provider. °What can I expect after the procedure? °After the procedure, it is common to have: °· Some redness on the skin where the shocks were given. ° °Follow these instructions at home: °· Do not drive for 24 hours if you were given a medicine to help you relax (sedative). °· Take over-the-counter and prescription medicines only as told by your health care provider. °· Ask your health care provider how to check your pulse. Check it often. °· Rest for 48 hours after the procedure or as told by your health care provider. °· Avoid or limit your caffeine use as told by your health care provider. °Contact a health care provider if: °· You feel like your heart is beating too quickly or your pulse is not regular. °· You have a serious muscle cramp that does not go away. °Get help right away if: °· You have discomfort in your chest. °· You are dizzy or you feel faint. °· You have trouble breathing or you are short of breath. °· Your speech is slurred. °· You have trouble moving an arm or leg on one side of your body. °· Your fingers or toes turn cold or blue. °This information is not intended to replace advice given to you by your health care provider. Make sure you discuss any questions you have with your health care provider. °Document Released: 12/12/2012 Document Revised: 09/25/2015 Document Reviewed: 08/28/2015 °Elsevier Interactive Patient Education © 2018 [employer] Inc[employer] ° °

## 2017-04-18 ENCOUNTER — Telehealth: Payer: Self-pay | Admitting: Cardiology

## 2017-04-18 ENCOUNTER — Encounter (HOSPITAL_COMMUNITY): Payer: Self-pay | Admitting: Cardiology

## 2017-04-18 NOTE — Telephone Encounter (Signed)
Spoke with pt, his SOB started yesterday when he got home from the [place]. He has had SOB prior to the DCCV but now it seems to be a lot worse. He had trouble sleeping lying flat and slept in the recliner. He has swelling but no more than normal. His bp is 128/59 and pulse is 68. He feels he is still in rhythm. Denies chest pain, dizziness or other symptoms. Will forward for camnitz review and advise

## 2017-04-18 NOTE — Telephone Encounter (Signed)
Pt c/o Shortness Of Breath: STAT if SOB developed within the last 24 hours or pt is noticeably SOB on the phone  1. Are you currently SOB (can you hear that pt is SOB on the phone)? YES   2. How long have you been experiencing SOB? 1 Yesterday afternoon    3. Are you SOB when sitting or when up moving around? Constantly   4. Are you currently experiencing any other symptoms? Nothing else.

## 2017-04-18 NOTE — Telephone Encounter (Signed)
Pt c/o Shortness Of Breath: STAT if SOB developed within the last 24 hours or pt is noticeably SOB on the phone  1. Are you currently SOB (can you hear that pt is SOB on the phone)? YES      2. How long have you been experiencing SOB? 1 yesterday afternoon  3. Are you SOB when sitting or when up moving around? Constantly   4. Are you currently experiencing any other symptoms? Nothing else

## 2017-04-18 NOTE — Telephone Encounter (Signed)
Call transferred into triage for worsening SOB. DCCV yesterday.  Shocked to NSR per pt. Had SOB prior to cardioversion which worsened when he was prescribed amiodarone.  Subsided after cardioversion until yesterday afternoon.  Now SOB is constant and has progressively worsened since yesterday.  No nausea, vomiting or chest pain.  HR is regular.  Has been 68 bpm, most recent BP is 128/59. Reviewed all of above with Dr. Curt Bears who recommends patient follow up with an APP either EP or one with Dr. Claiborne Billings.  Appointment made for tomorrow am.  Pt is aware.  Advised he should go to ER if SOB conitnues to worsen and he feels he cannot wait until tomorrow am.

## 2017-04-19 ENCOUNTER — Ambulatory Visit: Payer: Medicare Other | Admitting: Cardiology

## 2017-04-19 ENCOUNTER — Encounter: Payer: Self-pay | Admitting: Cardiology

## 2017-04-19 DIAGNOSIS — Z7901 Long term (current) use of anticoagulants: Secondary | ICD-10-CM | POA: Insufficient documentation

## 2017-04-19 DIAGNOSIS — I491 Atrial premature depolarization: Secondary | ICD-10-CM | POA: Diagnosis not present

## 2017-04-19 DIAGNOSIS — I10 Essential (primary) hypertension: Secondary | ICD-10-CM | POA: Diagnosis not present

## 2017-04-19 DIAGNOSIS — R0600 Dyspnea, unspecified: Secondary | ICD-10-CM

## 2017-04-19 DIAGNOSIS — R0609 Other forms of dyspnea: Secondary | ICD-10-CM

## 2017-04-19 DIAGNOSIS — I5030 Unspecified diastolic (congestive) heart failure: Secondary | ICD-10-CM | POA: Insufficient documentation

## 2017-04-19 DIAGNOSIS — I5031 Acute diastolic (congestive) heart failure: Secondary | ICD-10-CM

## 2017-04-19 DIAGNOSIS — Z951 Presence of aortocoronary bypass graft: Secondary | ICD-10-CM | POA: Diagnosis not present

## 2017-04-19 DIAGNOSIS — I480 Paroxysmal atrial fibrillation: Secondary | ICD-10-CM | POA: Diagnosis not present

## 2017-04-19 MED ORDER — APIXABAN 5 MG PO TABS: 5.0000 mg | ORAL_TABLET | Freq: Two times a day (BID) | ORAL | 3 refills | Status: DC

## 2017-04-19 MED ORDER — METOPROLOL TARTRATE 50 MG PO TABS: 75.0000 mg | ORAL_TABLET | Freq: Two times a day (BID) | ORAL | 3 refills | Status: DC

## 2017-04-19 NOTE — Progress Notes (Signed)
04/19/2017 [name] [facility]   1946/02/15  [id number]  Primary Physician Garwin Brothers, MD Primary Cardiologist: Dr Kelly/ Dr Curt Bears  HPI:  72 y/o male followed by Dr Claiborne Billings, with a history of CAD and PVD. The pt had CABG at [facility] in '84, redo at [facility] in '96 with combined RCEA (DrEarly), LM PCI in '98, and PCI in '05 ans 2012. Myoview in 2015 was low risk. Echo June 2018 showed EF 55-60% with PA pressure of 48 mmHg. The pt has had issues with poorly tolerated PAF. He could not tolerate Amiodarone in the past secondary to SOB. He recently saw Dr Curt Bears for recurrent symptomatic PAF. He was placed on amiodarone 200 mg daily and had DCCV 04/17/17. Unfortunately, his QTC has been prolonged and Dr Curt Bears feels he would not tolerate dofetilide or sotalol.  He was also concerned about his ablation risk due to the fact that he has significant coronary disease and his chronic stable angina.  The pt is in the office today for follow up. He did well initially but has noticed increasing DOE and mild orthopnea the last few days. He attributed it to recurrent side effects from Amiodarone. His home wgt is unchanged. In the office he is in NSR with frequent PACs.    Current Outpatient Medications  Medication Sig Dispense Refill  . albuterol (PROVENTIL HFA;VENTOLIN HFA) 108 (90 Base) MCG/ACT inhaler Inhale 1 puff into the lungs every 6 (six) hours as needed for wheezing or shortness of breath.    Marland Kitchen amiodarone (PACERONE) 200 MG tablet Take 0.5 tablets (100 mg total) by mouth daily. 45 tablet 3  . apixaban (ELIQUIS) 5 MG TABS tablet Take 1 tablet (5 mg total) by mouth 2 (two) times daily. 180 tablet 3  . butalbital-aspirin-caffeine (FIORINAL) 50-325-40 MG per capsule Take 1 capsule by mouth every 4 (four) hours as needed for headache.     . dutasteride (AVODART) 0.5 MG capsule Take 0.5 mg by mouth daily.     Marland Kitchen esomeprazole (NEXIUM) 40 MG capsule Take 1 capsule (40 mg total) by mouth 2 (two) times daily. 180 capsule 1  .  Ketotifen Fumarate (ALAWAY OP) Place 1 drop into the right eye daily.    Marland Kitchen loratadine (CLARITIN) 10 MG tablet Take 10 mg by mouth daily as needed for allergies, rhinitis or itching.   3  . lovastatin (MEVACOR) 40 MG tablet Take 20 mg by mouth at bedtime.    . metoprolol tartrate (LOPRESSOR) 50 MG tablet Take 1.5 tablets (75 mg total) by mouth 2 (two) times daily. 270 tablet 3  . Multiple Vitamins-Minerals (MULTIVITAMINS THER. W/MINERALS) TABS Take 1 tablet by mouth daily.      . nitroGLYCERIN (NITROLINGUAL) 0.4 MG/SPRAY spray PLACE ONE SPRAY UNDER TONGUE EVERY 5 MINUITES FOR UP TO 3 DOSES AS NEEDED FOR CHEST PAIN 4.9 g 3  . ranolazine (RANEXA) 1000 MG SR tablet Take 500 mg by mouth 2 (two) times daily.    Marland Kitchen torsemide (DEMADEX) 20 MG tablet Take 20 mg by mouth daily as needed (for fluid).     . vitamin E 400 UNIT capsule Take 400 Units by mouth daily.     Marland Kitchen ezetimibe (ZETIA) 10 MG tablet TAKE 1 TABLET (10 MG TOTAL) BY MOUTH DAILY. 90 tablet 3   No current facility-administered medications for this visit.     Allergies  Allergen Reactions  . Codeine Nausea And Vomiting  . Sulfonamide Derivatives Rash    Past Medical History:  Diagnosis Date  .  Anemia   . Angina   . Atrial fibrillation ([place])   . Blood transfusion   . Bronchitis   . Bulging discs    "8 of them; thoracic, lumbar, sacral area"  . Chronic back pain   . Colon polyps   . Complication of anesthesia   . Coronary artery disease 01/24/11   Successful PCI long segmental stensois  vein graft to the CX marginal vessel-graft previously stented prox. 3.0x42mm TAXUS stent mid seg 3.0x50mm TAXUS stent now tandem stens of 3.0x25mm & 3.0x60mm placed with the seg. 50,80 & 99% stenosis reduced to 0%  . DDD (degenerative disc disease)   . Dysrhythmia    "PAC's and PVC's"  . Esophageal dilatation    "have had it done 7 times; last time ~ 2001"  . Esophageal stricture   . GERD (gastroesophageal reflux disease)   . Headache(784.0)   .  Hiatal hernia   . Hyperlipidemia   . Myocardial infarction ([place]) 1991  . Myocardial infarction [facility]) 2005   "had 3 heart attacks this year"  . Paroxysmal atrial fibrillation (HCC)   . Pyloric stenosis   . RBBB   . Renal artery stenosis (HCC)    hhistory of right renal artery stenting and re-intervention for "in-stent restenosis.  . S/P CABG (coronary artery bypass graft) [place]  . Shortness of breath    "when I had the heart attacks"  . Subclavian artery stenosis, left ([place])   . Ulcer    "small; Dr. Henrene Pastor found it 02/2010"    Social History   Socioeconomic History  . Marital status: Married    Spouse name: Not on file  . Number of children: Not on file  . Years of education: Not on file  . Highest education level: Not on file  Social Needs  . Financial resource strain: Not on file  . Food insecurity - worry: Not on file  . Food insecurity - inability: Not on file  . Transportation needs - medical: Not on file  . Transportation needs - non-medical: Not on file  Occupational History  . Not on file  Tobacco Use  . Smoking status: Former Smoker    Packs/day: 1.00    Years: 38.00    Pack years: 38.00    Types: Cigarettes    Last attempt to quit: 02/09/2005    Years since quitting: 12.1  . Smokeless tobacco: Never Used  . Tobacco comment: "quit smoking cigarrettes 2006"  Substance and Sexual Activity  . Alcohol use: No    Alcohol/week: 0.0 oz  . Drug use: No  . Sexual activity: Not on file  Other Topics Concern  . Not on file  Social History Narrative  . Not on file     Family History  Problem Relation Age of Onset  . Breast cancer Paternal Aunt   . Diabetes Sister   . Ulcers Father   . Heart disease Father   . Heart failure Father   . Heart attack Father   . AAA (abdominal aortic aneurysm) Father   . Stroke Mother   . Hypertension Mother   . Colon cancer Neg Hx      Review of Systems: General: negative for  chills, fever, night sweats or weight changes.  Cardiovascular: negative for chest pain, dyspnea on exertion, edema, orthopnea, palpitations, paroxysmal nocturnal dyspnea or shortness of breath Dermatological: negative for rash Respiratory: negative for cough or wheezing Urologic: negative for hematuria Abdominal: negative  for nausea, vomiting, diarrhea, bright red blood per rectum, melena, or hematemesis Neurologic: negative for visual changes, syncope, or dizziness All other systems reviewed and are otherwise negative except as noted above.    Blood pressure 120/60, pulse 65, height 5' 5.5" (1.664 m), weight 144 lb (65.3 kg), SpO2 98 %.  General appearance: alert, cooperative, appears stated age, no distress and thin Neck: RCE scar, JVD noted when pt recumbant Lungs: basilar crackles Heart: regular rate and rhythm and frequent extra systole, 1-2/6 MR nmurmur Extremities: 1+ bilateral edema  Skin: Skin color, texture, turgor normal. No rashes or lesions Neurologic: Grossly normal  EKG NSR, PACs, RBBB, QTc 503  ASSESSMENT AND PLAN:   Dyspnea Pt c/o dyspnea with exertion, mild orthopnea for last 3 days. He thinks its from the Amiodarone as he had similar symptoms in the past.  Diastolic CHF ([place]) Suspect he may have mild diastolic CHF. He has known pulmonary HTN and has a soft MR murmur on exam.  Atrial fibrillation ([place]) Long history of PAF that he doesn't tolerate. S/P re initiation of low dose Amiodarone and OP DCCV 04/17/17  Dysrhythmia Frequent PACs today but in NSR  Hx of CABG '84, '96, last PCI 2012 Hx. CABG [facility] at [facility] redo CABG 1996. LM stent 1998, LAD, RCA Stent 2005, last PCI 2012  Essential hypertension Controlled  Chronic anticoagulation Eliquis 5 mg BID   PLAN  I suggested Mr [place] take his Demadex 3 times a week (he has not taken this anytime recently-it was ordered PRN). He is on Amiodarone 100 mg daily and I encouraged him to continue this and follow up  with EP in two weeks as scheduled.   If his SOB improves with the Demadex he probably needs another echo- no MR mentioned on his echo June 2018.   If he remains SOB we may want to consider a pulmonary consult. He is a former smoker and may have underlying pulmonary disease that more significant than we think. PA pressure was elevated-48 mmHg in June.   Kerin Ransom PA-C 04/19/2017 10:54 AM

## 2017-04-19 NOTE — Assessment & Plan Note (Signed)
Suspect he may have mild diastolic CHF. He has known pulmonary HTN and has a soft MR murmur on exam.

## 2017-04-19 NOTE — Assessment & Plan Note (Signed)
Long history of PAF that he doesn't tolerate. S/P re initiation of low dose Amiodarone and OP DCCV 04/17/17

## 2017-04-19 NOTE — Assessment & Plan Note (Signed)
Pt c/o dyspnea with exertion, mild orthopnea for last 3 days. He thinks its from the Amiodarone as he had similar symptoms in the past.

## 2017-04-19 NOTE — Assessment & Plan Note (Signed)
Controlled.  

## 2017-04-19 NOTE — Assessment & Plan Note (Signed)
Frequent PACs today but in NSR

## 2017-04-19 NOTE — Assessment & Plan Note (Signed)
Hx. CABG 1982 at [facility] redo CABG 1996. LM stent 1998, LAD, RCA Stent 2005, last PCI 2012

## 2017-04-19 NOTE — Telephone Encounter (Signed)
Acknowledges, patient was seen today in office by APP

## 2017-04-19 NOTE — Assessment & Plan Note (Signed)
Eliquis 5 mg BID 

## 2017-04-19 NOTE — Patient Instructions (Signed)
Medication Instructions: Your physician recommends that you continue on your current medications as directed. Please refer to the Current Medication list given to you today.  Take Demadex 3 times weekly.   Follow-Up: Keep follow-up appointments with Tommye Standard, PA and Dr. Curt Bears. They are listed below.

## 2017-04-24 NOTE — Addendum Note (Signed)
Addended by: Zebedee Iba on: 04/24/2017 03:25 PM   Modules accepted: Orders

## 2017-04-24 NOTE — Addendum Note (Signed)
Addended by: Zebedee Iba on: 04/24/2017 03:23 PM   Modules accepted: Orders

## 2017-04-25 ENCOUNTER — Encounter: Payer: Self-pay | Admitting: Internal Medicine

## 2017-04-25 ENCOUNTER — Ambulatory Visit: Payer: Medicare Other | Admitting: Internal Medicine

## 2017-04-25 VITALS — BP 124/60 | HR 68 | Ht 64.25 in | Wt 136.1 lb

## 2017-04-25 DIAGNOSIS — K5901 Slow transit constipation: Secondary | ICD-10-CM | POA: Diagnosis not present

## 2017-04-25 DIAGNOSIS — K4090 Unilateral inguinal hernia, without obstruction or gangrene, not specified as recurrent: Secondary | ICD-10-CM | POA: Diagnosis not present

## 2017-04-25 DIAGNOSIS — K219 Gastro-esophageal reflux disease without esophagitis: Secondary | ICD-10-CM | POA: Diagnosis not present

## 2017-04-25 DIAGNOSIS — K5520 Angiodysplasia of colon without hemorrhage: Secondary | ICD-10-CM | POA: Diagnosis not present

## 2017-04-25 DIAGNOSIS — K222 Esophageal obstruction: Secondary | ICD-10-CM

## 2017-04-25 DIAGNOSIS — G8929 Other chronic pain: Secondary | ICD-10-CM

## 2017-04-25 DIAGNOSIS — R1031 Right lower quadrant pain: Secondary | ICD-10-CM

## 2017-04-25 MED ORDER — ESOMEPRAZOLE MAGNESIUM 40 MG PO CPDR: 40.0000 mg | DELAYED_RELEASE_CAPSULE | Freq: Two times a day (BID) | ORAL | 3 refills | Status: DC

## 2017-04-25 NOTE — Progress Notes (Signed)
HISTORY OF PRESENT ILLNESS:  [name] is a 72 y.o. male with MULTIPLE SIGNIFICANT medical problems including hypertension, hyperlipidemia, coronary artery disease status post coronary artery bypass grafting, intra-coronary stent placement, GERD complicated by erosive esophagitis and peptic stricture, peptic ulcer disease, adenomatous colon polyps, atrial fibrillation on oral anticoagulants, cecal AVM status post ablation therapy, and chronic constipation. He presents today for follow-up regarding his. His GI diagnosis. As well new complaint of lower abdominal discomfort which she describes as "gas pain". Patient has not been seen in a little over 3 years. Colonoscopy 04/07/2014 with diminutive adenomas and oozing cecal AVM treated with cautery. Follow-up in 5 years if medically fit to be considered. For his GERD he continues on Nexium 40 mg twice daily. No active reflux symptoms on medication. He does have some very mild stable dysphagia. He does not feel that he needs repeat dilation at present. Continues with chronic constipation for which she requests additional advice. Finally, lower abdominal discomfort, principally in the right inguinal region. Typically last 24-48 hours. Has been going on for over one year. In the area of an inguinal hernia. Pressure-like fullness sensation in that area during these episodes. The complaint is not affected by bowel movements, meals, or passing flatus. No nausea or vomiting. Review of outside laboratories from 04/17/2017 reveals unremarkable basic metabolic panel. Hemoglobin 11.6.. Recent cardioversion for atrial fibrillation. He is hopeful to get back to cardiac rehabilitation which he has missed over the past year  REVIEW OF SYSTEMS:  All non-GI ROS negative unless otherwise stated in the history of present illness except for sinus allergy, back pain, fatigue, irregular heart rate, ankle swelling, shortness of breath  Past Medical History:  Diagnosis Date  .  Anemia   . Angina   . Atrial fibrillation ([place])   . Blood transfusion   . Bronchitis   . Bulging discs    "8 of them; thoracic, lumbar, sacral area"  . Chronic back pain   . Colon polyps   . Complication of anesthesia   . Coronary artery disease 01/24/11   Successful PCI long segmental stensois  vein graft to the CX marginal vessel-graft previously stented prox. 3.0x53mm TAXUS stent mid seg 3.0x35mm TAXUS stent now tandem stens of 3.0x67mm & 3.0x42mm placed with the seg. 50,80 & 99% stenosis reduced to 0%  . DDD (degenerative disc disease)   . Dysrhythmia    "PAC's and PVC's"  . Esophageal dilatation    "have had it done 7 times; last time ~ 2001"  . Esophageal stricture   . GERD (gastroesophageal reflux disease)   . Headache(784.0)   . Hiatal hernia   . Hyperlipidemia   . Myocardial infarction ([place]) 1991  . Myocardial infarction [facility]) 2005   "had 3 heart attacks this year"  . Paroxysmal atrial fibrillation (HCC)   . Pyloric stenosis   . RBBB   . Renal artery stenosis (HCC)    hhistory of right renal artery stenting and re-intervention for "in-stent restenosis.  . S/P CABG (coronary artery bypass graft) [place]  . Shortness of breath    "when I had the heart attacks"  . Subclavian artery stenosis, left ([place])   . Ulcer    "small; Dr. Henrene Pastor found it 02/2010"    Past Surgical History:  Procedure Laterality Date  . ADENOIDECTOMY     "when I was an infant"  . APPENDECTOMY  1953  . CARDIOVERSION N/A 04/17/2017   Procedure: CARDIOVERSION;  Surgeon: Jerline Pain, MD;  Location[facility] ENDOSCOPY;  Service: Cardiovascular;  Laterality: N/A;  . CAROTID ENDARTERECTOMY Right [place]  01/24/11   "today makes a total of 9 stents"  . CORONARY ARTERY BYPASS GRAFT  1984   CABG X 4  . CORONARY ARTERY BYPASS GRAFT  1996   CABG X 4  . LEFT HEART CATHETERIZATION WITH CORONARY/GRAFT ANGIOGRAM N/A  01/24/2011   Procedure: LEFT HEART CATHETERIZATION WITH Beatrix Fetters;  Surgeon: Troy Sine, MD;  Location: [facility] CATH LAB;  Service: Cardiovascular;  Laterality: N/A;  . pylondial cyst removal    . RETINAL DETACHMENT SURGERY  04/2007   right  . TONSILLECTOMY  1972    Social History [name]  reports that he quit smoking about 12 years ago. His smoking use included cigarettes. He has a 38.00 pack-year smoking history. he has never used smokeless tobacco. He reports that he does not drink alcohol or use drugs.  family history includes AAA (abdominal aortic aneurysm) in his father; Breast cancer in his paternal aunt; Diabetes in his sister; Heart attack in his father; Heart disease in his father; Heart failure in his father; Hypertension in his mother; Stroke in his mother; Ulcers in his father.  Allergies  Allergen Reactions  . Codeine Nausea And Vomiting  . Sulfonamide Derivatives Rash       PHYSICAL EXAMINATION: Vital signs: BP 124/60 (BP Location: Right Arm, Patient Position: Sitting, Cuff Size: Normal)   Pulse 68 Comment: irregular  Ht 5' 4.25" (1.632 m) Comment: height measured without shoes  Wt 136 lb 2 oz (61.7 kg)   BMI 23.18 kg/m   Constitutional: Thin, generally well-appearing, no acute distress Psychiatric: alert and oriented x3, cooperative Eyes: extraocular movements intact, anicteric, conjunctiva pink Mouth: oral pharynx moist, no lesions Neck: supple no lymphadenopathy Cardiovascular: heart regular rate and rhythm, no murmur Lungs: clear to auscultation bilaterally Abdomen: soft, nontender, nondistended, no obvious ascites, no peritoneal signs, normal bowel sounds, no organomegaly Groin: Large right inguinal hernia which can be manually reduced  Rectal: Omitted Extremities: no lower extremity edema bilaterally Skin: no lesions on visible extremities Neuro: No focal deficits. Cranial nerves intact. No asterixis.   ASSESSMENT:  #1. Right sided  lower abdominal discomfort secondary to large right inguinal hernia. #2. GERD, complicated by peptic stricture. Classic symptoms controlled with PPI. #3. History of esophageal stricture. Mild stable dysphagia. No indication for dilation at present #4. History of adenomatous colon polyps. Last colonoscopy 3 years ago #5. History of cecal AVM status post ablation #6. Functional constipation. Ongoing #7. Multiple significant medical problems. Ongoing  PLAN:  #1. Reflux precautions #2. Continue Nexium 40 mg twice daily. Prescription refilled #3. Corset for hernia. #4. Surgical evaluation for inguinal hernia should worsen. Patient declined surgical evaluation at this time stating his cardiac issues and concerns by his cardiologist. I did strongly advise him to go to the emergency room should he develop significant or unremitting pain as this may represent incarceration. He understands and agrees #5. MiraLAX for constipation. Advised how to titrate #6. Routine office follow-up 2 years. Contact the office in the interim for questions or problems #7. Could consider empiric iron therapy with history of AVM on chronic oral anticoagulants #8. Consider surveillance colonoscopy in 2 years, pending health status  45 has been face-to-face with the patient. Greater than 50% a time use for counseling regarding management of his chronic GERD with esophageal stricture, constipation, symptomatic  right inguinal hernia, colonic AVMs requiring ablation now on anticoagulants

## 2017-04-25 NOTE — Patient Instructions (Signed)
We have sent the following medications to your pharmacy for you to pick up at your convenience:  Nexium  Per Dr. Henrene Pastor, you are not a surgical candidate for a corset for your inguinal hernia.  Please follow up in 2 years

## 2017-05-01 NOTE — Progress Notes (Signed)
Cardiology Office Note Date:  05/02/2017  Patient ID:  [name], [name] [birth year], MRN [id number] PCP:  Garwin Brothers, MD  Cardiologist:  Dr. Claiborne Billings Electrophysiologist: Dr. Curt Bears    Chief Complaint: f/u on AF   History of Present Illness: [name] is a 72 y.o. male with history of severe CAD, underwent CABG in [place] in 1984.  In 1996 he required redo CABG, most recently cath in 2012 with PCI to SVG.  He also has severe PVD with a a carotid endarterectomy at the time of hs re-do CABG it seems..  In 1998 he underwent stenting of the left main.  He has renal artery stenosis and underwent stenting in predilation in 2005, also known bilateral SFA occlusions.  He has a subclavian artery occlusion with right subclavian stenosis.  HTN, HLD, number of esophageal dilations, long hx of AFib with DCCV in the past, RBBB, chronic diastolic CHF  He comes today to be seen for Dr. Curt Bears, he saw him in consult Jan 2019, at that time, was started on 200mg  daily of amiodarone in hopes he would tolerate without side effects given now good other AAD options and not felt to be a good ablation candidate with severe CAD and chronic stablle angina.  He underwent DCCV 04/17/17, had f/u with L.Kilroy, PA-C 04/19/17 was in SR with PVCs, felt to have some fluid OL and his demadex started at 3x/week rather then PRN.  Thoughts were if SOB not improved to repeat echo to revisit MR and perhaps pulm consult given hx of smoking.  He is accompanied by his wife.  He is acutely aware of his heart rhythm, reports able to feel just about every skipped or extra beat, has not felt like he has had any AFib.  He will get some chest discomfort when his HR is "fast", mentions this occurred a few night prior, woke noting his HR in the 80's, that settled on it's own, he reports this is not new in years, when he goes out in the very cold and breaths cold air his HR will increase and feel the same chest discomfort.  This pattern is  unchanged for years.  Is not exertional. Denies any CP otherwise.  No near syncope or syncope.  He has noticed a slight tremor of his hands has started back, balance remains OK back on the amiodarone, he feels this is tolerable.  He feels better with the 3x week demadex, swelling is improved, SOB much better.  No bleeding or signs of bleeding, taking his Eliquis   AFib Hx: AAD Hx Amiodarone 200-mg BID stopped 2/2 gait disturbance, fatigue, tremors >>  Jan 2018 resumed at 200mg  daily >> 100mg  Prolonged QT for dofetilide or sotalol use  Past Medical History:  Diagnosis Date  . Anemia   . Angina   . Atrial fibrillation ([place])   . Blood transfusion   . Bronchitis   . Bulging discs    "8 of them; thoracic, lumbar, sacral area"  . Chronic back pain   . Colon polyps   . Complication of anesthesia   . Coronary artery disease 01/24/11   Successful PCI long segmental stensois  vein graft to the CX marginal vessel-graft previously stented prox. 3.0x89mm TAXUS stent mid seg 3.0x22mm TAXUS stent now tandem stens of 3.0x40mm & 3.0x59mm placed with the seg. 50,80 & 99% stenosis reduced to 0%  . DDD (degenerative disc disease)   . Dysrhythmia    "PAC's and PVC's"  . Esophageal  dilatation    "have had it done 7 times; last time ~ 2001"  . Esophageal stricture   . GERD (gastroesophageal reflux disease)   . Headache(784.0)   . Hiatal hernia   . Hyperlipidemia   . Myocardial infarction ([place]) 1991  . Myocardial infarction [facility]) 2005   "had 3 heart attacks this year"  . Paroxysmal atrial fibrillation (HCC)   . Pyloric stenosis   . RBBB   . Renal artery stenosis (H[facility])    hhistory of right renal artery stenting and re-intervention for "in-stent restenosis.  . S/P CABG (coronary artery bypass graft) [place]  . Shortness of breath    "when I had the heart attacks"  . Subclavian artery stenosis, left ([place])   . Ulcer    "small; Dr. Henrene Pastor found it 02/2010"     Past Surgical History:  Procedure Laterality Date  . ADENOIDECTOMY     "when I was an infant"  . APPENDECTOMY  1953  . CARDIOVERSION N/A 04/17/2017   Procedure: CARDIOVERSION;  Surgeon: Jerline Pain, MD;  Location: [facility] ENDOSCOPY;  Service: Cardiovascular;  Laterality: N/A;  . CAROTID ENDARTERECTOMY Right [place]  01/24/11   "today makes a total of 9 stents"  . CORONARY ARTERY BYPASS GRAFT  1984   CABG X 4  . CORONARY ARTERY BYPASS GRAFT  1996   CABG X 4  . LEFT HEART CATHETERIZATION WITH CORONARY/GRAFT ANGIOGRAM N/A 01/24/2011   Procedure: LEFT HEART CATHETERIZATION WITH Beatrix Fetters;  Surgeon: Troy Sine, MD;  Location: [facility] CATH LAB;  Service: Cardiovascular;  Laterality: N/A;  . pylondial cyst removal    . RETINAL DETACHMENT SURGERY  04/2007   right  . TONSILLECTOMY  1972    Current Outpatient Medications  Medication Sig Dispense Refill  . albuterol (PROVENTIL HFA;VENTOLIN HFA) 108 (90 Base) MCG/ACT inhaler Inhale 1 puff into the lungs every 6 (six) hours as needed for wheezing or shortness of breath.    Marland Kitchen amiodarone (PACERONE) 200 MG tablet Take 0.5 tablets (100 mg total) by mouth every other day. 30 tablet 6  . apixaban (ELIQUIS) 5 MG TABS tablet Take 1 tablet (5 mg total) by mouth 2 (two) times daily. 180 tablet 3  . butalbital-aspirin-caffeine (FIORINAL) 50-325-40 MG per capsule Take 1 capsule by mouth every 4 (four) hours as needed for headache.     . dutasteride (AVODART) 0.5 MG capsule Take 0.5 mg by mouth daily.     Marland Kitchen esomeprazole (NEXIUM) 40 MG capsule Take 1 capsule (40 mg total) by mouth 2 (two) times daily. 180 capsule 3  . Ketotifen Fumarate (ALAWAY OP) Place 1 drop into the right eye daily.    Marland Kitchen loratadine (CLARITIN) 10 MG tablet Take 10 mg by mouth daily as needed for allergies, rhinitis or itching.   3  . lovastatin (MEVACOR) 40 MG tablet Take 20 mg by mouth at bedtime.    . metoprolol tartrate  (LOPRESSOR) 50 MG tablet Take 1.5 tablets (75 mg total) by mouth 2 (two) times daily. 270 tablet 3  . Multiple Vitamins-Minerals (MULTIVITAMINS THER. W/MINERALS) TABS Take 1 tablet by mouth daily.      . nitroGLYCERIN (NITROLINGUAL) 0.4 MG/SPRAY spray PLACE ONE SPRAY UNDER TONGUE EVERY 5 MINUITES FOR UP TO 3 DOSES AS NEEDED FOR CHEST PAIN 4.9 g 3  . ranolazine (RANEXA) 1000 MG SR tablet Take 500 mg by mouth 2 (two) times  daily.    . torsemide (DEMADEX) 20 MG tablet Take 20 mg by mouth 3 (three) times a week.     . vitamin E 400 UNIT capsule Take 400 Units by mouth daily.     Marland Kitchen ezetimibe (ZETIA) 10 MG tablet TAKE 1 TABLET (10 MG TOTAL) BY MOUTH DAILY. 90 tablet 3   No current facility-administered medications for this visit.     Allergies:   Codeine and Sulfonamide derivatives   Social History:  The patient  reports that he quit smoking about 12 years ago. His smoking use included cigarettes. He has a 38.00 pack-year smoking history. he has never used smokeless tobacco. He reports that he does not drink alcohol or use drugs.   Family History:  The patient's family history includes AAA (abdominal aortic aneurysm) in his father; Breast cancer in his paternal aunt; Diabetes in his sister; Heart attack in his father; Heart disease in his father; Heart failure in his father; Hypertension in his mother; Stroke in his mother; Ulcers in his father.  ROS:  Please see the history of present illness.  All other systems are reviewed and otherwise negative.   PHYSICAL EXAM:  VS:  BP 140/70   Pulse 64   Ht 5' 4.25" (1.632 m)   Wt 135 lb 12.8 oz (61.6 kg)   SpO2 97%   BMI 23.13 kg/m  BMI: Body mass index is 23.13 kg/m. Well nourished, well developed, in no acute distress  HEENT: normocephalic, atraumatic  Neck: no JVD, R carotid bruit noted, no masses Cardiac:  RRR; no significant murmurs, no rubs, or gallops Lungs:  CTA b/l, no wheezing, rhonchi or rales  Abd: soft, nontender MS: no deformity,  age appropriate atrophy Ext: trace edema  Skin: warm and dry, no rash Neuro:  No gross deficits appreciated Psych: euthymic mood, full affect   EKG:  Done today and reviewed by myself is SR, RBBB, LPFB, appears similar to previous  08/24/16: TTE Study Conclusions - Left ventricle: The cavity size was normal. Wall thickness was   increased in a pattern of moderate LVH. Systolic function was   normal. The estimated ejection fraction was in the range of 55%   to 60%. Wall motion was normal; there were no regional wall   motion abnormalities. The study is not technically sufficient to   allow evaluation of LV diastolic function. - Mitral valve: Calcified annulus. Mildly thickened leaflets .   There was mild regurgitation. - Left atrium: The atrium was normal in size. - Right atrium: The atrium was mildly dilated. - Tricuspid valve: There was mild regurgitation. - Pulmonary arteries: PA peak pressure: 48 mm Hg (S). - Inferior vena cava: The vessel was dilated. The respirophasic   diameter changes were blunted (< 50%), consistent with elevated   central venous pressure. Impressions: - Compared to a prior study in 2016, the LVEF is stable at 55-60%.  Holter 06/17/16 The predominant rhythm is atrial fibrillation. The average heart rate is 82 bpm with a minimum heart rate of 50 to a maximum heart rate of 119 bpm. There are frequent PVCs with several episodes of bigeminy and trigeminy and a very rare ventricular couplet. There were no episodes of ventricular tachycardia. There were no prolonged causes.    Recent Labs: 04/17/2017: BUN 26; Creatinine, Ser 1.00; Hemoglobin 11.6; Potassium 4.5; Sodium 140  No results found for requested labs within last 8760 hours.   Estimated Creatinine Clearance: 55.6 mL/min (by C-G formula based on SCr of 1  mg/dL).   Wt Readings from Last 3 Encounters:  05/02/17 135 lb 12.8 oz (61.6 kg)  04/25/17 136 lb 2 oz (61.7 kg)  04/19/17 144 lb (65.3 kg)      Other studies reviewed: Additional studies/records reviewed today include: summarized above  ASSESSMENT AND PLAN:  1. Persistent AFib     CHA2DS2Vasc is 2, on eliquis, appropriately dosed     Will further reduce amiodarone to 100mg  QOD, follow, hopefully slight tremor will resolve     LFT/sTSH today  2. CAD      No new complaints, reports some chest discomfort, pattern/behavior is unchanged for years     He sees Dr. Claiborne Billings in a few weeks  3. PVD     Follows with Dr. Donnetta Hutching, reports he saw him only a month or 2 ago  4. HFpEF     Weight is down, less SOB,          Continue same, BMET today to f/u  5. HLD     Pt reports monitored and managed with his PMD   Disposition:  as above, keep appy with dr. Claiborne Billings next month, f/u EP in 6-8 weeks, sooner if needed   Current medicines are reviewed at length with the patient today.  The patient did not have any concerns regarding medicines.  Venetia Night, PA-C 05/02/2017 1:03 PM     [address]  [id number] [phone] (office)  [phone] (fax)

## 2017-05-02 ENCOUNTER — Ambulatory Visit: Payer: Medicare Other | Admitting: Physician Assistant

## 2017-05-02 ENCOUNTER — Encounter: Payer: Self-pay | Admitting: Physician Assistant

## 2017-05-02 VITALS — BP 140/70 | HR 64 | Ht 64.25 in | Wt 135.8 lb

## 2017-05-02 DIAGNOSIS — I739 Peripheral vascular disease, unspecified: Secondary | ICD-10-CM | POA: Diagnosis not present

## 2017-05-02 DIAGNOSIS — I4819 Other persistent atrial fibrillation: Secondary | ICD-10-CM

## 2017-05-02 DIAGNOSIS — I2510 Atherosclerotic heart disease of native coronary artery without angina pectoris: Secondary | ICD-10-CM | POA: Diagnosis not present

## 2017-05-02 DIAGNOSIS — I5030 Unspecified diastolic (congestive) heart failure: Secondary | ICD-10-CM

## 2017-05-02 DIAGNOSIS — Z79899 Other long term (current) drug therapy: Secondary | ICD-10-CM | POA: Diagnosis not present

## 2017-05-02 DIAGNOSIS — I481 Persistent atrial fibrillation: Secondary | ICD-10-CM

## 2017-05-02 LAB — HEPATIC FUNCTION PANEL
ALBUMIN: 3.8 g/dL (ref 3.5–4.8)
ALK PHOS: 92 IU/L (ref 39–117)
ALT: 12 IU/L (ref 0–44)
AST: 17 IU/L (ref 0–40)
Bilirubin Total: 0.2 mg/dL (ref 0.0–1.2)
Bilirubin, Direct: 0.11 mg/dL (ref 0.00–0.40)
TOTAL PROTEIN: 6.2 g/dL (ref 6.0–8.5)

## 2017-05-02 LAB — BASIC METABOLIC PANEL
BUN/Creatinine Ratio: 20 (ref 10–24)
BUN: 26 mg/dL (ref 8–27)
CALCIUM: 8.9 mg/dL (ref 8.6–10.2)
CHLORIDE: 104 mmol/L (ref 96–106)
CO2: 23 mmol/L (ref 20–29)
Creatinine, Ser: 1.31 mg/dL — ABNORMAL HIGH (ref 0.76–1.27)
GFR calc Af Amer: 62 mL/min/{1.73_m2} (ref >59)
GFR, EST NON AFRICAN AMERICAN: 54 mL/min/{1.73_m2} — AB (ref >59)
GLUCOSE: 97 mg/dL (ref 65–99)
POTASSIUM: 4.7 mmol/L (ref 3.5–5.2)
SODIUM: 141 mmol/L (ref 134–144)

## 2017-05-02 LAB — TSH: TSH: 0.417 u[IU]/mL — AB (ref 0.450–4.500)

## 2017-05-02 MED ORDER — AMIODARONE HCL 200 MG PO TABS: 100.0000 mg | ORAL_TABLET | ORAL | 6 refills | Status: DC

## 2017-05-02 NOTE — Patient Instructions (Addendum)
Medication Instructions:   START TAKING  AMIODARONE  HALF A TABLET EVERY OTHER DAY   If you need a refill on your cardiac medications before your next appointment, please call your pharmacy.  Labwork:  BMET TSH AND LFT  TODAY    Testing/Procedures: NONE ORDERED  TODAY   Follow-Up:  KEEP APPT AS SDCHEDULED   Any Other Special Instructions Will Be Listed Below (If Applicable).

## 2017-05-04 ENCOUNTER — Telehealth: Payer: Self-pay | Admitting: *Deleted

## 2017-05-04 DIAGNOSIS — E0590 Thyrotoxicosis, unspecified without thyrotoxic crisis or storm: Secondary | ICD-10-CM

## 2017-05-04 DIAGNOSIS — Z79899 Other long term (current) drug therapy: Secondary | ICD-10-CM

## 2017-05-04 NOTE — Telephone Encounter (Signed)
SPOKE WITH PT AND PT AWARE OF RESULTS AND LAB WORK THAT NEEDS TO BE DRAWN.   PT WOULD LIKE LABS TSH TO BE DRAWN AT DR Claiborne Billings OFFICE ON 06-01-17 APPT.  MENTIONED THAT WILL MESSAGE DR KELLY NURSE TO SEND A REMINDER FOR THE NURSE TO KNOW ABOUT THE LABS  TSH NEEDED.

## 2017-05-04 NOTE — Telephone Encounter (Signed)
-----   Message from [facility], [place] sent at 05/03/2017 11:17 AM EST ----- TSH resulted late, just slightly low, repeat in a month please.  Thanks  [employer]

## 2017-05-05 NOTE — Telephone Encounter (Signed)
Order placed for repeat.

## 2017-05-05 NOTE — Addendum Note (Signed)
Addended by: Patria Mane A on: 05/05/2017 03:05 PM   Modules accepted: Orders

## 2017-05-05 NOTE — Addendum Note (Signed)
Addended by: Patria Mane A on: 05/05/2017 03:03 PM   Modules accepted: Orders

## 2017-05-26 ENCOUNTER — Encounter: Payer: Self-pay | Admitting: Cardiovascular Disease

## 2017-05-26 ENCOUNTER — Ambulatory Visit: Payer: Medicare Other | Admitting: Cardiovascular Disease

## 2017-05-26 VITALS — BP 130/58 | HR 64 | Ht 65.5 in | Wt 139.8 lb

## 2017-05-26 DIAGNOSIS — I6523 Occlusion and stenosis of bilateral carotid arteries: Secondary | ICD-10-CM

## 2017-05-26 DIAGNOSIS — I739 Peripheral vascular disease, unspecified: Secondary | ICD-10-CM

## 2017-05-26 DIAGNOSIS — I25119 Atherosclerotic heart disease of native coronary artery with unspecified angina pectoris: Secondary | ICD-10-CM | POA: Diagnosis not present

## 2017-05-26 DIAGNOSIS — I452 Bifascicular block: Secondary | ICD-10-CM | POA: Diagnosis not present

## 2017-05-26 DIAGNOSIS — I4819 Other persistent atrial fibrillation: Secondary | ICD-10-CM

## 2017-05-26 DIAGNOSIS — R600 Localized edema: Secondary | ICD-10-CM

## 2017-05-26 DIAGNOSIS — R7989 Other specified abnormal findings of blood chemistry: Secondary | ICD-10-CM

## 2017-05-26 DIAGNOSIS — I481 Persistent atrial fibrillation: Secondary | ICD-10-CM | POA: Diagnosis not present

## 2017-05-26 DIAGNOSIS — Z79899 Other long term (current) drug therapy: Secondary | ICD-10-CM

## 2017-05-26 NOTE — Patient Instructions (Signed)
MEDICATION INSTRUCTION    PLEASE TAKE TORSEMIDE  20 MG AT LEAST TWICE A WEEK     LABS -   CBC .  CMP .  TSH .  FREET4   OKAY TO RESTART  CARDIAC REHAB - [place]   Your physician wants you to follow-up in  5 TO [place]. You will receive a reminder letter in the mail two months in advance. If you don't receive a letter, please call our office to schedule the follow-up appointment.   If you need a refill on your cardiac medications before your next appointment, please call your pharmacy.

## 2017-05-26 NOTE — Progress Notes (Signed)
Patient ID: [name], male   DOB: October 24, 1945, 72 y.o.   MRN: [id number]    Primary MD:  Dr. Garwin Brothers  HPI: [name] is a 72 y.o. male who is a former patient of Dr. Rollene Fare.  He presents for a 5 month follow-up cardiology evaluation.  [name] has an extensive cardiac and peripheral vascular history. He underwent initial CABG revascularization surgery in [place] in 1984. In 1996 he required re-do CABG revascularization surgery after all grafts were found to be occluded. At that time he also underwent right carotid endarterectomy. In 1998 he underwent stenting of his left main coronary artery and also had stenting done to the vein graft supplying the marginal vessel. He has history of right renal artery stenosis and underwent initial stenting with predilatation 2005. He has documented chronic right bundle branch block. He also significant peripheral vascular disease with bilateral SFA occlusion. He has known left subclavian artery occlusion with right subclavian stenosis. Has a history of hypertension, hyperlipidemia, and remotely smoked cigarettes. His last catheterization was done in November 2012 which showed significant native CAD with evidence for patent left main stent. He had an occluded circumflex marginal vessel from the AV groove circumflex. There was 80% ostial RCA stenosis with 90% stenosis in the mid right coronary artery proximal to the RV marginal branch and total occlusion of the mid RCA. An occluded vein graft supplying the right coronary artery but evidence for collateralization to the distal right artery the left coronary injection and injection of the grafts. The vein supplying the marginal vessel with the previously placed ostial proximal Taxus stent had in-stent narrowing of 50% followed by 95% stenosis in the body of the proximal third of the vessel graft prior to the previously placed mid grafts 3.0x20 mm Taxus stent. At that time, he underwent difficult but  successful intervention involving long segmental stenosis of the vein graft supplying the circumflex marginal vessel. Additional tandem 3.0x38 mm and 3.0x8 mm Taxus stents were inserted with excellent angiographic result.   He saw Dr. Rollene Fare in August 2014 per at that time due to concern for progressive carotid stenoses repeat Doppler studies were done and he also saw Dr. Sherren Mocha early for followup evaluation. Repeat imaging showed a patent right carotid endarterectomy site the Doppler velocity suggestive of 40  percent stenosis of the bilateral proximal internal carotid arteries. There was left subclavian steal noted with retrograde left vertebral artery flow, monophasic left subclavian artery flow and significant difference in the bilateral brachial pressures.  He established cardiology care with me in December 2014.  A nuclear perfusion study in May 2015 demonstrated mild inferobasal ischemia, most likely secondary to RCA distribution.    On 03/25/2014 he was seen by Dr. Martinique after he developed an episode of atrial fibrillation the day before.  An ECG in Dr. Latina Craver  office showed atrial fibrillation with rate of 120.  His atenolol was increased to 50 mg twice a day, and he converted to sinus rhythm after 18 hours.  When he saw Dr. Martinique the following day he was in sinus rhythm.  On the increased dose of beta blocker he denies recurrent palpitations.  He tells me he recently underwent a colonoscopy and had 3 polyps removed, but also required cauterization for another site that was oozing blood.  This reason, he was not restarted on anticoagulation but has been maintained on low-dose aspirin and Plavix.  He has seen Dr. Gwenlyn Found in follow-up of his PVD and  is status post left renal stenting.  He also is followed by Dr. Donnetta Hutching for his carotid disease.  In August 2017 he was seen by Kerin Ransom after he was found to be in atrial fibrillation 1 week previously at cardiac rehabilitation.  He was started on  amiodarone 200 mg twice a day for 1 week and then 200 mg daily and his atenolol was reduced to 25 mg twice a day.  He was seen several weeks later on 10/29/2015 by Lesia Hausen and was back in sinus rhythm.  He underwent carotid duplex imaging on 12/23/2015.  The right carotid endarterectomy site had Doppler velocities suggestive of a 60-79% stenosis. The left internal carotid artery velocity suggested a 40-59% stenoses.  Renal duplex imaging to just a patent right renal artery stent.  There was stable greater than 60% left renal artery stenosis.    When I  saw him in November 2017 he was maintaining sinus rhythm and  since he was on amiodarone, I recommended he reduce lovastatin to 20 mg and added Zetia for continued aggressive lipid management.  He had progressive lower extremity edema and recommended he increase his torsemide.  In March 2018 he developed episodes of bronchitis.  Since that time his rhythm has been irregular.  He wore a Holter monitor which showed atrial fibrillation with a minimum rate at 52 at 4:02 AM was sleeping and maximum rate at 119.  In the afternoon with an average rate of 82 bpm.  There are frequent PVCs, rare couplets, occasional episodes of bigeminy and trigeminy but no episodes of VT.  There were no pauses.  Previously, his blood pressure had become low and hehad been on losartan and I saw him one month ago there was leg swelling, left greater than right.  He had a chest x-ray with Dr. Reesa Chew on 06/21/2016.  A  follow-up carotid Doppler study in April 2018 showed improvement in his velocities such that his right carotid endarterectomy was patent with velocities now suggestive of 40-59% stenosis.  The left internal carotid velocities suggested a 1-39% stenosis.   When I saw him in May 2018, he was in atrial fibrillation.  I discontinued Plavix and started him on eliquis 5 mg twice a day.  I recommended that he not to take aspirin with his GI history.  I increased amiodarone to  200 mg twice a day and reduced his Ranexa dose to 500 mg twice a day with concomitant therapy.  He was having lower extremity edema.  A Doppler study was negative for DVT by his primary physician.  I recommended support stockings.    I saw him 1 month later and he continued to be in atrial fibrillation.  At that time, we discussed potential DC cardioversion for restoration of sinus rhythm.  He underwent repeat echo on 08/24/2016 which showed an EF of 55-60%.  His left atrium was normal in size and his right atrium was mildly dilated.  PA pressure was increased at 48 mm.  There was moderate LVH. Marland Kitchen  He apparently was seen subsequent to that evaluation by Almyra Deforest and was having 2+ bilateral lower extremity edema.  Further discussion concerning cardioversion was undertaken but the patient initially deferred at that point and wanted to that it further.  He tells me 1 month ago he noticed some bright red blood per rectum.  As result, he self reduced his eliquis from 5 mg twice a day down to 5 mg daily.  Remotely, he has seen Dr.  Scarlette Shorts in the past.  His last colonoscopy was at least 5 years ago.  He had stopped taking amiodarone since he felt to contribute to fatigue, tremors, dizziness, and gait staggering.  The symptomshave improved off amiodarone antiarrhythmic therapy.  Her, he has noticed that at times his pulse can increase up to the low 100s with minimal activity.  He has now been using compression stockings for his ankle edema which has improved.    When I last saw him in November 2018 , I recommended eliquis 5 mg twice a day for full anticoagulation.  I started him on metoprolol 50 mg twice a day  I discussed options concerning continuing permanent AF versus possible Tikosyn or AF ablation and referred him for  EP evaluation.  He saw Dr. Curt Bears in January 2019. Due to his QTC prolongation  he was not felt to be a candidate for Tikosyn or sotalol a and also was felt to be high risk for ablation.  As  result he recommended reiitiation of low-dose amiodarone.n 04/12/2017.  He underwent successful cardioversion.  When he saw Kerin Ransom on 04/19/2017.  At that time, the patient had complaints of increasing dyspnea on exertion and orthopnea, which were similar to his previous symptoms on amiodarone.  When he last saw Shanon Brow a on May 02, 2017 he also complained of a fine tremor since institution of amiodarone, and amiodarone  was reduced to 100 mg every other day.   Since I saw him, he also underwent carotid duplex imaging which showed mild bilateral carotid plaque 40-59% on the right and 1-39% in the left.  In addition, an abdominal renal duplex study showed patent right renal artery stent, moderate left renal artery stenosis, and significant SMA stenosis.  This was reviewed by Dr. Gwenlyn Found.  He believes his tremor and loss of coordination on the higher dose of amiodarone has improved with low-dose amiodarone.  He has not been taking Demadex for the past 10 days and this has resulted in increasing leg swelling.  He presents for evaluation  Past Medical History:  Diagnosis Date  . Anemia   . Angina   . Atrial fibrillation ([place])   . Blood transfusion   . Bronchitis   . Bulging discs    "8 of them; thoracic, lumbar, sacral area"  . Chronic back pain   . Colon polyps   . Complication of anesthesia   . Coronary artery disease 01/24/11   Successful PCI long segmental stensois  vein graft to the CX marginal vessel-graft previously stented prox. 3.0x34m TAXUS stent mid seg 3.0x243mTAXUS stent now tandem stens of 3.0x3820m 3.0x8mm37maced with the seg. 50,80 & 99% stenosis reduced to 0%  . DDD (degenerative disc disease)   . Dysrhythmia    "PAC's and PVC's"  . Esophageal dilatation    "have had it done 7 times; last time ~ 2001"  . Esophageal stricture   . GERD (gastroesophageal reflux disease)   . Headache(784.0)   . Hiatal hernia   . Hyperlipidemia   . Myocardial infarction (HCC)[id number]    [facility]   "had 3 heart attacks this year"  . Paroxysmal atrial fibrillation (HCC)   . Pyloric stenosis   . RBBB   . Renal artery stenosis (HCC)    hhistory of right renal artery stenting and re-intervention for "in-stent restenosis.  . S/P CABG (coronary artery bypass graft) 1984Fernando SalinasShortness  of breath    "when I had the heart attacks"  . Subclavian artery stenosis, left ([place])   . Ulcer    "small; Dr. Henrene Pastor found it 02/2010"    Past Surgical History:  Procedure Laterality Date  . ADENOIDECTOMY     "when I was an infant"  . APPENDECTOMY  1953  . CARDIOVERSION N/A 04/17/2017   Procedure: CARDIOVERSION;  Surgeon: Jerline Pain, MD;  Location: [facility] ENDOSCOPY;  Service: Cardiovascular;  Laterality: N/A;  . CAROTID ENDARTERECTOMY Right [place]  01/24/11   "today makes a total of 9 stents"  . CORONARY ARTERY BYPASS GRAFT  1984   CABG X 4  . CORONARY ARTERY BYPASS GRAFT  1996   CABG X 4  . LEFT HEART CATHETERIZATION WITH CORONARY/GRAFT ANGIOGRAM N/A 01/24/2011   Procedure: LEFT HEART CATHETERIZATION WITH Beatrix Fetters;  Surgeon: Troy Sine, MD;  Location: [facility] CATH LAB;  Service: Cardiovascular;  Laterality: N/A;  . pylondial cyst removal    . RETINAL DETACHMENT SURGERY  04/2007   right  . TONSILLECTOMY  1972    Allergies  Allergen Reactions  . Codeine Nausea And Vomiting  . Sulfonamide Derivatives Rash    Current Outpatient Medications  Medication Sig Dispense Refill  . albuterol (PROVENTIL HFA;VENTOLIN HFA) 108 (90 Base) MCG/ACT inhaler Inhale 1 puff into the lungs every 6 (six) hours as needed for wheezing or shortness of breath.    Marland Kitchen amiodarone (PACERONE) 200 MG tablet Take 0.5 tablets (100 mg total) by mouth every other day. 30 tablet 6  . apixaban (ELIQUIS) 5 MG TABS tablet Take 1 tablet (5 mg total) by mouth 2 (two) times daily. 180 tablet 3   . butalbital-aspirin-caffeine (FIORINAL) 50-325-40 MG per capsule Take 1 capsule by mouth every 4 (four) hours as needed for headache.     . dutasteride (AVODART) 0.5 MG capsule Take 0.5 mg by mouth daily.     Marland Kitchen esomeprazole (NEXIUM) 40 MG capsule Take 1 capsule (40 mg total) by mouth 2 (two) times daily. 180 capsule 3  . Ketotifen Fumarate (ALAWAY OP) Place 1 drop into the right eye daily.    Marland Kitchen loratadine (CLARITIN) 10 MG tablet Take 10 mg by mouth daily as needed for allergies, rhinitis or itching.   3  . lovastatin (MEVACOR) 40 MG tablet Take 20 mg by mouth at bedtime.    . metoprolol tartrate (LOPRESSOR) 50 MG tablet Take 1.5 tablets (75 mg total) by mouth 2 (two) times daily. 270 tablet 3  . Multiple Vitamins-Minerals (MULTIVITAMINS THER. W/MINERALS) TABS Take 1 tablet by mouth daily.      . nitroGLYCERIN (NITROLINGUAL) 0.4 MG/SPRAY spray PLACE ONE SPRAY UNDER TONGUE EVERY 5 MINUITES FOR UP TO 3 DOSES AS NEEDED FOR CHEST PAIN 4.9 g 3  . ranolazine (RANEXA) 1000 MG SR tablet Take 500 mg by mouth 2 (two) times daily.    Marland Kitchen torsemide (DEMADEX) 20 MG tablet Take 20 mg by mouth as needed (for fluid). FOR SWELLING OR WEIGHT GAIN    . vitamin E 400 UNIT capsule Take 400 Units by mouth daily.      No current facility-administered medications for this visit.     Social History   Socioeconomic History  . Marital status: Married    Spouse name: Not on file  . Number of children: Not on file  . Years of education: Not on file  . Highest education level: Not on file  Occupational History  . Not on file  Social Needs  . Financial resource strain: Not on file  . Food insecurity:    Worry: Not on file    Inability: Not on file  . Transportation needs:    Medical: Not on file    Non-medical: Not on file  Tobacco Use  . Smoking status: Former Smoker    Packs/day: 1.00    Years: 38.00    Pack years: 38.00    Types: Cigarettes    Last attempt to quit: 02/09/2005    Years since quitting:  12.3  . Smokeless tobacco: Never Used  . Tobacco comment: "quit smoking cigarrettes 2006"  Substance and Sexual Activity  . Alcohol use: No    Alcohol/week: 0.0 oz  . Drug use: No  . Sexual activity: Not on file  Lifestyle  . Physical activity:    Days per week: Not on file    Minutes per session: Not on file  . Stress: Not on file  Relationships  . Social connections:    Talks on phone: Not on file    Gets together: Not on file    Attends religious service: Not on file    Active member of club or organization: Not on file    Attends meetings of clubs or organizations: Not on file    Relationship status: Not on file  . Intimate partner violence:    Fear of current or ex partner: Not on file    Emotionally abused: Not on file    Physically abused: Not on file    Forced sexual activity: Not on file  Other Topics Concern  . Not on file  Social History Narrative  . Not on file   Socially he is married has 2 children 2 stepchildren. He quit tobacco in 2000.  Family History  Problem Relation Age of Onset  . Breast cancer Paternal Aunt   . Diabetes Sister   . Ulcers Father   . Heart disease Father   . Heart failure Father   . Heart attack Father   . AAA (abdominal aortic aneurysm) Father   . Stroke Mother   . Hypertension Mother   . 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 Henrene Pastor. GU: Occasional concentrated or  dark urine without blood Musculoskeletal: Negative; no myalgias, joint pain, or weakness Hematologic/Oncology: Negative; no easy bruising, bleeding Endocrine: Negative; no heat/cold intolerance; no diabetes Neuro: Negative; no changes in  balance, headaches Skin: Negative; No rashes or skin lesions Psychiatric: Negative; No behavioral problems, depression Sleep: Negative; No snoring, daytime sleepiness, hypersomnolence, bruxism, restless legs, hypnogognic hallucinations, no cataplexy Other comprehensive 14 point system review is negative.   PE BP (!) 130/58   Pulse 64   Ht 5' 5.5" (1.664 m)   Wt 139 lb 12.8 oz (63.4 kg)   BMI 22.91 kg/m    Repeat blood pressure was 128/60  Wt Readings from Last 3 Encounters:  05/26/17 139 lb 12.8 oz (63.4 kg)  05/02/17 135 lb 12.8 oz (61.6 kg)  04/25/17 136 lb 2 oz (61.7 kg)   General: Alert, oriented, no distress.  Skin: normal turgor, no rashes, warm and dry HEENT: Normocephalic, atraumatic.  Pupils equal round and reactive to light; sclera anicteric; extraocular muscles intact; Nose without nasal septal hypertrophy Mouth/Parynx benign; Mallinpatti scale 3 Neck: No JVD, no carotid bruits; normal carotid upstroke Lungs: clear to ausculatation and percussion; no wheezing or rales Chest wall: without tenderness to palpitation Heart: PMI not displaced, RRR, s1 s2 normal, 1/6 systolic murmur, no diastolic murmur, no rubs, gallops, thrills, or heaves Abdomen: soft, nontender; no hepatosplenomehaly, BS+; abdominal aorta nontender and not dilated by palpation. Back: no CVA tenderness Pulses 2+ Musculoskeletal: full range of motion, normal strength, no joint deformities Extremities: 2+ pitting edema on the left and 1+ on the right LE;  no clubbing cyanosis, Homan's sign negative  Neurologic: grossly nonfocal; Cranial nerves grossly wnl Psychologic: Normal mood and affect    ECG (independently read by me): normal sinus rhythm at 64 bpm.  Right bundle branch block with repolarization changes.  Left posterior hemiblock.  QTc interval 497 ms.    November 2018 ECG (independently read by me): Atrial flutter with variable block.  PVC.  Left posterior hemiblock.  Right bundle branch  block.  June 2018 ECG (independently read by me): Atrial fibrillation with ventricular rate at 70 bpm.  Right bundle-branch block with repolarization changes.  Probable left posterior fascicular block.  07/21/2016 ECG (independently read by me): atrial fibrillation with a controlled ventricular rate at 71 bpm.  Right bundle branch block and left posterior hemiblock.  Small inferior Q waves.  November 2017 ECG (independently read by me): Sinus rhythm with a PVC.  Right bundle branch block and probable left posterior hemiblock.  Inferior Q waves with preserved R waves.  February 2017 ECG (independently read by me): normal sinus rhythm at 61 bpm. Probable left atrial enlargement. Right bundle branch block.  ECG (independently read by me): Sinus bradycardia 58 bpm.  Increased QTc interval 473 ms.  Right bundle-branch block.  May 2016 ECG (independently read by me): Sinus bradycardia 59 bpm with right bundle branch block.I ST-T changes.  QTc interval 475 ms.  PR interval 182 ms.  No ectopy.  February 2016 ECG (independently read by me) normal sinus rhythm at 63 bpm.  Right bundle-branch block.  Probable left posterior fascicular block.  PR interval 170 ms.  QTC interval 480 ms.  November 2015 ECG (independently read by me): Normal sinus rhythm at 62 bpm.  Right bundle branch block.  Small nondiagnostic inferior Q waves.  Prior May 2015ECG : sinus rhythm at 58 beats per minute.  Right bundle branch block.  Prior December 2014 ECG: Sinus rhythm with sinus bradycardia 55 beats per minute. Right bundle branch block.  LABS: BMP Latest Ref Rng & Units 05/02/2017 04/17/2017 10/14/2015  Glucose 65 - 99 mg/dL 97 96 96  BUN 8 - 27 mg/dL 26 26(H) 26(H)  Creatinine 0.76 - 1.27 mg/dL 1.31(H) 1.00 1.12  BUN/Creat Ratio 10 - 24 20 - -  Sodium 134 - 144 mmol/L 141 140 140  Potassium 3.5 - 5.2 mmol/L 4.7 4.5 5.1  Chloride 96 - 106 mmol/L 104 108 110  CO2 20 - 29 mmol/L 23 - 23  Calcium 8.6 - 10.2 mg/dL 8.9 -  8.7   Hepatic Function Latest Ref Rng & Units 05/02/2017 10/14/2015 11/25/2014  Total Protein 6.0 - 8.5 g/dL 6.2 5.8(L) 6.0(L)  Albumin 3.5 - 4.8 g/dL 3.8 3.7 3.9  AST 0 - 40 IU/L 17 17 15   ALT 0 - 44 IU/L 12 11 10   Alk Phosphatase 39 - 117 IU/L 92 64 75  Total Bilirubin 0.0 - 1.2 mg/dL 0.2 0.4 0.3  Bilirubin, Direct 0.00 - 0.40 mg/dL 0.11 - -   CBC Latest Ref Rng & Units 04/17/2017 11/25/2014 01/25/2011  WBC 4.0 - 10.5 K/uL - 7.1 7.4  Hemoglobin 13.0 - 17.0 g/dL 11.6(L) 13.1 9.8(L)  Hematocrit 39.0 - 52.0 % 34.0(L) 38.3(L) 28.0(L)  Platelets 150 - 400 K/uL - 188 120(L)   Lab Results  Component Value Date   MCV 97.0 11/25/2014   MCV 94.0 01/25/2011   MCV 93.2 01/25/2011   Lab Results  Component Value Date   TSH 0.417 (L) 05/02/2017    Lipid Panel     Component Value Date/Time   CHOL 172 08/01/2014 0852   TRIG 90 08/01/2014 0852   HDL 66 08/01/2014 0852   CHOLHDL 2.6 08/01/2014 0852   VLDL 18 08/01/2014 0852   LDLCALC 88 08/01/2014 0852     RADIOLOGY: No results found.  IMPRESSION:  1. Persistent atrial fibrillation ([place])   2. RBBB (right bundle branch block with left anterior fascicular block)   3. PVD (peripheral vascular disease) ([place])   4. Lower extremity edema   5. Abnormal thyroid blood test   6. Coronary artery disease involving native coronary artery of native heart with unspecified  angina pectoris ([place]) on anti-ischemic meds   7. Mild atherosclerosis of both carotid arteries   8. Medication management     ASSESSMENT AND PLAN: [name] is a 72 year old gentleman who has an extensive CAD/PVD history, and is 35 years status post his initial CABG revascularization surgery done in [place] in 1984 and 23 years status post his second CABG surgery in 1996. He has had multiple peripheral vascular procedures including carotid endarterectomy, renal stenting, and is documented SFA occlusion. His last catheterization/ intervention was done in 2012 and  demonstrated diffuse disease in the vein graft supplying the marginal vessel and 2 additional Taxus stents inserted commencing ostially and extending to the proximal portion of the recently placed mid prior stent. He has a known occluded graft to the RCA with a mid RCA occlusion with left to right collaterals. He also has subclavian steal. He does have mild hypokinesis inferolaterally in the basal inferior wall with an EF of 45% at that catheterization. He has chronic right bundle branch block. He has not been demonstrated to have an abdominal aortic aneurysm. When I had seen him previously he had complained of mild chest anginal symptomatology typically when he starts exercise and particularly after eating. He had potential areas of myocardium which may be responsible for ischemia and I added  Ranexa 500 mg twice a day to his medical regimen and subsequently this was further titrated to 1000 mg twice a day , which resolved his symptomatology.  Because of issues with atrial fibrillation and a trial of amiodarone therapy in the past, Ranexa dose was reduced due to concomitant medication.  Since I last saw him, he had undergone EP evaluation for other options.  In the past he did not tolerate amiodarone due to dyspnea.  When amiodarone was restarted, he again started to notice shortness of breath in addition to some fine tremor.  He underwent successful cardioversion.  Due to his symptoms, he is now on a reduced dose of amiodarone at just 100 mg every other day.  He is maintaining sinus rhythm today without awareness of recurrent atrial fibrillation.  He is now on full dose anticoagulation which I had increased at his last office visit after he had self  reduced it to only daily dosing due to GI issues.  He now has 1-2+ bilateral pitting edema after holding his Demadex for the past 10 days.  On May 02, 2017 creatinine was 1.3.  TSH was 0.417.  Presently he denies recurrent bleeding.  His last echo Doppler study in  June 2018 showed an EF of 55-60%.  There was mitral annular calcification.  He had moderate PA hypertension with a peak pressure of 48 mm he continues to be on lovastatin 20 mg for hyperlipidemia.  Target LDL is less than 70.  His level will need to be rechecked.  He is not having recurrent anginal symptomatology on his regimen consisting of Ranexa 500 mg twice a day, metoprolol 75 mg twice a day,.  Blood pressure today is stable.  He continues to be on Nexium for GERD.  I reviewed his carotid studies as well as his renal duplex evaluation.  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  Repeat laboratory will be obtained.  He has a follow-up appointment to see Dr. Curt Bears.  I will see him in 6 months for reevaluation.  Time spent: 25 minutes  Troy Sine, MD, [facility]  05/28/2017 10:59 AM

## 2017-05-28 ENCOUNTER — Encounter: Payer: Self-pay | Admitting: Cardiovascular Disease

## 2017-05-29 ENCOUNTER — Telehealth: Payer: Self-pay | Admitting: Cardiovascular Disease

## 2017-05-29 NOTE — Telephone Encounter (Signed)
Patient calling,  States that he asked for a note stating that it was okay to start cardiac rehab at [facility]. Patient is calling to see if he can have the note.  Patient is looking to start cardiac rehab next Monday.

## 2017-05-29 NOTE — Telephone Encounter (Signed)
.    Okay to resume cardiac rehabilitation

## 2017-05-30 NOTE — Telephone Encounter (Signed)
[id number] 

## 2017-05-30 NOTE — Telephone Encounter (Signed)
Per pt this is the address to send the [place]:  [place]:  Na[place] Diener [address] [place] [address].  [phone]

## 2017-05-30 NOTE — Telephone Encounter (Signed)
Phone # listed did not work. Called patient to verify this number, which he got out of the phone book. He does not have the fax number.   Called [phone] per web search of [facility] cardiac rehab. A signed form will need to be faxed. [facility] rehab staff will send to [facility] attention so that MD can sign form. Fax # [phone]

## 2017-05-31 LAB — COMPREHENSIVE METABOLIC PANEL
A/G RATIO: 2.1 (ref 1.2–2.2)
ALK PHOS: 109 IU/L (ref 39–117)
ALT: 12 IU/L (ref 0–44)
AST: 19 IU/L (ref 0–40)
Albumin: 4.1 g/dL (ref 3.5–4.8)
BILIRUBIN TOTAL: 0.2 mg/dL (ref 0.0–1.2)
BUN / CREAT RATIO: 17 (ref 10–24)
BUN: 22 mg/dL (ref 8–27)
CHLORIDE: 107 mmol/L — AB (ref 96–106)
CO2: 19 mmol/L — AB (ref 20–29)
CREATININE: 1.33 mg/dL — AB (ref 0.76–1.27)
Calcium: 8.8 mg/dL (ref 8.6–10.2)
GFR calc Af Amer: 61 mL/min/{1.73_m2} (ref >59)
GFR calc non Af Amer: 53 mL/min/{1.73_m2} — ABNORMAL LOW (ref >59)
Globulin, Total: 2 g/dL (ref 1.5–4.5)
Glucose: 128 mg/dL — ABNORMAL HIGH (ref 65–99)
POTASSIUM: 5 mmol/L (ref 3.5–5.2)
SODIUM: 143 mmol/L (ref 134–144)
Total Protein: 6.1 g/dL (ref 6.0–8.5)

## 2017-05-31 LAB — TSH: TSH: 0.49 u[IU]/mL (ref 0.450–4.500)

## 2017-05-31 LAB — T4, FREE: Free T4: 1.56 ng/dL (ref 0.82–1.77)

## 2017-05-31 LAB — CBC
HEMATOCRIT: 33.5 % — AB (ref 37.5–51.0)
Hemoglobin: 11.4 g/dL — ABNORMAL LOW (ref 13.0–17.7)
MCH: 31.1 pg (ref 26.6–33.0)
MCHC: 34 g/dL (ref 31.5–35.7)
MCV: 91 fL (ref 79–97)
Platelets: 163 10*3/uL (ref 150–379)
RBC: 3.67 x10E6/uL — ABNORMAL LOW (ref 4.14–5.80)
RDW: 15.1 % (ref 12.3–15.4)
WBC: 6.1 10*3/uL (ref 3.4–10.8)

## 2017-06-01 ENCOUNTER — Ambulatory Visit: Payer: Medicare Other | Admitting: Cardiovascular Disease

## 2017-06-01 ENCOUNTER — Encounter: Payer: Self-pay | Admitting: Cardiology

## 2017-06-01 NOTE — Telephone Encounter (Signed)
Referral has been signed by Dr. Claiborne Billings and faxed to [facility]

## 2017-06-10 ENCOUNTER — Other Ambulatory Visit: Payer: Self-pay | Admitting: Cardiology

## 2017-06-12 NOTE — Telephone Encounter (Signed)
This is Dr. Kelly's pt. °

## 2017-06-19 ENCOUNTER — Telehealth: Payer: Self-pay | Admitting: Cardiology

## 2017-06-19 NOTE — Telephone Encounter (Signed)
New message  Pt verbalzied that he is calling for RN  Pt wants to know if he needs to be seen on  07/18/2017   Pt seen Dr. Claiborne Billings 05/26/2017 he said he feels fine

## 2017-06-19 NOTE — Telephone Encounter (Signed)
Informed patient ok to cancel May OV w/ Camn[facility] and move follow up to around November 2019. Pt aware recall letter placed in Epic and letter will sent to remind him to make f/u appt. Pt appreciative.

## 2017-06-29 ENCOUNTER — Telehealth: Payer: Self-pay | Admitting: Cardiology

## 2017-06-29 NOTE — Telephone Encounter (Signed)
Pt has hx of persistent AFIb. Pt reports experiencing AFib since last night. Reports CP increases as HR increases.  HRs jumping into the 120s. He is also experiencing SOB w/ episodes (pt audibly SOB on the phone). Pt is not aware if he is "in" rhythm or "out" of rhythm -- he only knows by BP monitor. Reports that he has been going to cardiac rehab M/W/F and pretty sure he has been in [facility] when there. Scheduled pt to see Roderic Palau, NP tomorrow in the AFib clinic to address current symptoms.   Information/directions/phone number/garage code given to pt. Patient verbalized understanding and agreeable to plan.    Pt also reports experiencing a fever on and off over the last month.  States that it will occur for 3 days, then he is fine for several days, then fever begins again for 3 days, and then cycle repeats.    Reports temperature ranging from 94.4 - 101.9.   Headaches and body aches associated with occurrences. Advised pt to contact PCP to discuss further.  Informed patient that reported symptoms sounded like they may be allergy/sinus related. Also advised pt to obtain a mask tomorrow, if he was experiencing any elevated temperature, to have for OV at AFib clinic. Patient verbalized understanding and agreeable to plan.

## 2017-06-29 NOTE — Telephone Encounter (Signed)
New Message  Patient c/o Palpitations:  High priority if patient c/o lightheadedness, shortness of breath, or chest pain  1) How long have you had palpitations/irregular HR/ Afib? Are you having the symptoms now? 3 days and yes  2) Are you currently experiencing lightheadedness, SOB or CP?  Sob and cp  3) Do you have a history of afib (atrial fibrillation) or irregular heart rhythm? yes  4) Have you checked your BP or HR? (document readings if available): hr 120's bp 140/80  5) Are you experiencing any other symptoms? Hr ranging in the 120's, fever, and aching

## 2017-06-30 ENCOUNTER — Ambulatory Visit (HOSPITAL_COMMUNITY)
Admission: RE | Admit: 2017-06-30 | Discharge: 2017-06-30 | Disposition: A | Discharge status: Home / Self Care | Payer: Medicare Other | Source: Ambulatory Visit | Attending: Nurse Practitioner | Admitting: Nurse Practitioner

## 2017-06-30 ENCOUNTER — Encounter (HOSPITAL_COMMUNITY): Payer: Self-pay | Admitting: Nurse Practitioner

## 2017-06-30 VITALS — BP 118/64 | HR 94 | Ht 65.5 in | Wt 132.0 lb

## 2017-06-30 DIAGNOSIS — I252 Old myocardial infarction: Secondary | ICD-10-CM | POA: Diagnosis not present

## 2017-06-30 DIAGNOSIS — I4510 Unspecified right bundle-branch block: Secondary | ICD-10-CM | POA: Diagnosis not present

## 2017-06-30 DIAGNOSIS — I110 Hypertensive heart disease with heart failure: Secondary | ICD-10-CM | POA: Diagnosis not present

## 2017-06-30 DIAGNOSIS — Z7982 Long term (current) use of aspirin: Secondary | ICD-10-CM | POA: Diagnosis not present

## 2017-06-30 DIAGNOSIS — I480 Paroxysmal atrial fibrillation: Secondary | ICD-10-CM

## 2017-06-30 DIAGNOSIS — M549 Dorsalgia, unspecified: Secondary | ICD-10-CM | POA: Diagnosis not present

## 2017-06-30 DIAGNOSIS — Z87891 Personal history of nicotine dependence: Secondary | ICD-10-CM | POA: Diagnosis not present

## 2017-06-30 DIAGNOSIS — I5032 Chronic diastolic (congestive) heart failure: Secondary | ICD-10-CM | POA: Diagnosis not present

## 2017-06-30 DIAGNOSIS — Z951 Presence of aortocoronary bypass graft: Secondary | ICD-10-CM | POA: Diagnosis not present

## 2017-06-30 DIAGNOSIS — Z86010 Personal history of colonic polyps: Secondary | ICD-10-CM | POA: Diagnosis not present

## 2017-06-30 DIAGNOSIS — I2510 Atherosclerotic heart disease of native coronary artery without angina pectoris: Secondary | ICD-10-CM | POA: Insufficient documentation

## 2017-06-30 DIAGNOSIS — E785 Hyperlipidemia, unspecified: Secondary | ICD-10-CM | POA: Diagnosis not present

## 2017-06-30 DIAGNOSIS — Z7901 Long term (current) use of anticoagulants: Secondary | ICD-10-CM | POA: Diagnosis not present

## 2017-06-30 DIAGNOSIS — G8929 Other chronic pain: Secondary | ICD-10-CM | POA: Diagnosis not present

## 2017-06-30 DIAGNOSIS — K219 Gastro-esophageal reflux disease without esophagitis: Secondary | ICD-10-CM | POA: Insufficient documentation

## 2017-06-30 DIAGNOSIS — Z955 Presence of coronary angioplasty implant and graft: Secondary | ICD-10-CM | POA: Insufficient documentation

## 2017-06-30 DIAGNOSIS — Z79899 Other long term (current) drug therapy: Secondary | ICD-10-CM | POA: Diagnosis not present

## 2017-06-30 LAB — COMPREHENSIVE METABOLIC PANEL
ALBUMIN: 3.2 g/dL — AB (ref 3.5–5.0)
ALT: 16 U/L — AB (ref 17–63)
AST: 22 U/L (ref 15–41)
Alkaline Phosphatase: 98 U/L (ref 38–126)
Anion gap: 10 (ref 5–15)
BUN: 28 mg/dL — AB (ref 6–20)
CHLORIDE: 101 mmol/L (ref 101–111)
CO2: 23 mmol/L (ref 22–32)
CREATININE: 1.54 mg/dL — AB (ref 0.61–1.24)
Calcium: 8.7 mg/dL — ABNORMAL LOW (ref 8.9–10.3)
GFR calc Af Amer: 50 mL/min — ABNORMAL LOW (ref >60)
GFR calc non Af Amer: 43 mL/min — ABNORMAL LOW (ref >60)
GLUCOSE: 126 mg/dL — AB (ref 65–99)
POTASSIUM: 4.7 mmol/L (ref 3.5–5.1)
SODIUM: 134 mmol/L — AB (ref 135–145)
Total Bilirubin: 0.6 mg/dL (ref 0.3–1.2)
Total Protein: 6.5 g/dL (ref 6.5–8.1)

## 2017-06-30 LAB — CBC WITH DIFFERENTIAL/PLATELET
Basophils Absolute: 0 10*3/uL (ref 0.0–0.1)
Basophils Relative: 0 %
Eosinophils Absolute: 0.4 10*3/uL (ref 0.0–0.7)
Eosinophils Relative: 5 %
HEMATOCRIT: 33 % — AB (ref 39.0–52.0)
HEMOGLOBIN: 11.2 g/dL — AB (ref 13.0–17.0)
LYMPHS ABS: 0.9 10*3/uL (ref 0.7–4.0)
LYMPHS PCT: 12 %
MCH: 30.9 pg (ref 26.0–34.0)
MCHC: 33.9 g/dL (ref 30.0–36.0)
MCV: 91.2 fL (ref 78.0–100.0)
MONOS PCT: 12 %
Monocytes Absolute: 0.9 10*3/uL (ref 0.1–1.0)
NEUTROS ABS: 5.2 10*3/uL (ref 1.7–7.7)
NEUTROS PCT: 71 %
Platelets: 157 10*3/uL (ref 150–400)
RBC: 3.62 MIL/uL — AB (ref 4.22–5.81)
RDW: 14.9 % (ref 11.5–15.5)
WBC: 7.3 10*3/uL (ref 4.0–10.5)

## 2017-06-30 LAB — TSH: TSH: 0.269 u[IU]/mL — AB (ref 0.350–4.500)

## 2017-06-30 MED ORDER — DILTIAZEM HCL 30 MG PO TABS: ORAL_TABLET | ORAL | 1 refills | Status: DC

## 2017-06-30 NOTE — Progress Notes (Signed)
Primary Care Physician: Garwin Brothers, MD Referring Physician: Venida Jarvis Price/ Dr. Sherlyn Lick is a 72 y.o. male with a h/o severe CAD, underwent CABG in [place] in 1984. In 1996 he required redo CABG, most recently cath in 2012 with PCI to SVG. He also has severe PVD with a a carotid endarterectomy at the time of hs re-do CABG it seems.. In 1998 he underwent stenting of the left main. He has renal artery stenosis and underwent stenting in [place] in 2005, also known bilateral SFA occlusions. He has a subclavian artery occlusion with right subclavian stenosis.  HTN, HLD, number of esophageal dilations, long hx of AFib with DCCV in the past, RBBB, chronic diastolic CHF. He saw Dr. Curt Bears in Jan 2019 for persistent afib, was started on amiodarone and had successful cardioversion. He could not tolerate higher doses of amiodarone and is currently taking 100 mg qod.  He has been doing well until the last 3 weeks when he developed fevers with H/A, body aches, anorexia.. Fever was up to 101.9. He felt poorly for 3 days  then improved. These symptoms with intermittent fever and body aches have been occurring every 4-5 days. At home this am with low dose fever   at 99-100. He has also lost 7 lbs since this has been occuring. He has had intermittent afib with the fevers. Yesterday, spiked a fever as well and developed afib  again. He is rate controlled in the office. Soft BP's noted at home. He has had some anginal symptom's, but states this is not that unusual for him. He has not seen his PCP, was waiting for his physical in a few weeks. Denies tick bites, being around other people who have been sick, no one else in the household with symptoms, no rashes, or diarrhea/vominting, cough. Has not noted any blood in stool or urine.   Today, he denies symptoms of palpitations,  shortness of breath, orthopnea, PND, lower extremity edema, dizziness, presyncope, syncope, or neurologic sequela.  Intermittent fever, + for chronic anginal symptoms, body aches, anorexia, intermittent afib. The patient is tolerating medications without difficulties and is otherwise without complaint today.   Past Medical History:  Diagnosis Date  . Anemia   . Angina   . Atrial fibrillation ([place])   . Blood transfusion   . Bronchitis   . Bulging discs    "8 of them; thoracic, lumbar, sacral area"  . Chronic back pain   . Colon polyps   . Complication of anesthesia   . Coronary artery disease 01/24/11   Successful PCI long segmental stensois  vein graft to the CX marginal vessel-graft previously stented prox. 3.0x42mm TAXUS stent mid seg 3.0x47mm TAXUS stent now tandem stens of 3.0x71mm & 3.0x66mm placed with the seg. 50,80 & 99% stenosis reduced to 0%  . DDD (degenerative disc disease)   . Dysrhythmia    "PAC's and PVC's"  . Esophageal dilatation    "have had it done 7 times; last time ~ 2001"  . Esophageal stricture   . GERD (gastroesophageal reflux disease)   . Headache(784.0)   . Hiatal hernia   . Hyperlipidemia   . Myocardial infarction ([place]) 1991  . Myocardial infarction [facility]) 2005   "had 3 heart attacks this year"  . Paroxysmal atrial fibrillation (H[facility])   . Pyloric stenosis   . RBBB   . Renal artery stenosis ([facility])    hhistory of right renal artery stenting and re-intervention for "in-stent restenosis.  Marland Kitchen  S/P CABG (coronary artery bypass graft) [place]  . Shortness of breath    "when I had the heart attacks"  . Subclavian artery stenosis, left ([place])   . Ulcer    "small; Dr. Henrene Pastor found it 02/2010"   Past Surgical History:  Procedure Laterality Date  . ADENOIDECTOMY     "when I was an infant"  . APPENDECTOMY  1953  . CARDIOVERSION N/A 04/17/2017   Procedure: CARDIOVERSION;  Surgeon: Jerline Pain, MD;  Location: [facility] ENDOSCOPY;  Service: Cardiovascular;  Laterality: N/A;  . CAROTID ENDARTERECTOMY Right [place]  01/24/11   "today makes a total of 9 stents"  . CORONARY ARTERY BYPASS GRAFT  1984   CABG X 4  . CORONARY ARTERY BYPASS GRAFT  1996   CABG X 4  . LEFT HEART CATHETERIZATION WITH CORONARY/GRAFT ANGIOGRAM N/A 01/24/2011   Procedure: LEFT HEART CATHETERIZATION WITH Beatrix Fetters;  Surgeon: Troy Sine, MD;  Location: [facility] CATH LAB;  Service: Cardiovascular;  Laterality: N/A;  . pylondial cyst removal    . RETINAL DETACHMENT SURGERY  04/2007   right  . TONSILLECTOMY  1972    Current Outpatient Medications  Medication Sig Dispense Refill  . amiodarone (PACERONE) 200 MG tablet Take 0.5 tablets (100 mg total) by mouth every other day. 30 tablet 6  . apixaban (ELIQUIS) 5 MG TABS tablet Take 1 tablet (5 mg total) by mouth 2 (two) times daily. 180 tablet 3  . butalbital-aspirin-caffeine (FIORINAL) 50-325-40 MG per capsule Take 1 capsule by mouth every 4 (four) hours as needed for headache.     . dutasteride (AVODART) 0.5 MG capsule Take 0.5 mg by mouth daily.     Marland Kitchen esomeprazole (NEXIUM) 40 MG capsule Take 1 capsule (40 mg total) by mouth 2 (two) times daily. 180 capsule 3  . Ketotifen Fumarate (ALAWAY OP) Place 1 drop into the right eye daily.    Marland Kitchen loratadine (CLARITIN) 10 MG tablet Take 10 mg by mouth daily as needed for allergies, rhinitis or itching.   3  . lovastatin (MEVACOR) 40 MG tablet Take 20 mg by mouth at bedtime.    . metoprolol tartrate (LOPRESSOR) 50 MG tablet Take 1.5 tablets (75 mg total) by mouth 2 (two) times daily. 270 tablet 3  . Multiple Vitamins-Minerals (MULTIVITAMINS THER. W/MINERALS) TABS Take 1 tablet by mouth daily.      . nitroGLYCERIN (NITROLINGUAL) 0.4 MG/SPRAY spray PLACE ONE SPRAY UNDER TONGUE EVERY 5 MINUITES FOR UP TO 3 DOSES AS NEEDED FOR CHEST PAIN 4.9 g 3  . ranolazine (RANEXA) 1000 MG SR tablet Take 500 mg by mouth 2 (two) times daily.    Marland Kitchen torsemide (DEMADEX) 20 MG tablet Take 20 mg by mouth as needed (for fluid). FOR SWELLING OR  WEIGHT GAIN    . vitamin E 400 UNIT capsule Take 400 Units by mouth daily.     Marland Kitchen diltiazem (CARDIZEM) 30 MG tablet Take one tablet by mouth every 4 hours AS NEEDED for A-fib HR > 100 as long as BP > 100. 30 tablet 1   No current facility-administered medications for this encounter.     Allergies  Allergen Reactions  . Codeine Nausea And Vomiting  . Sulfonamide Derivatives Rash    Social History   Socioeconomic History  . Marital status: Married    Spouse name: Not on file  . Number of children: Not  on file  . Years of education: Not on file  . Highest education level: Not on file  Occupational History  . Not on file  Social Needs  . Financial resource strain: Not on file  . Food insecurity:    Worry: Not on file    Inability: Not on file  . Transportation needs:    Medical: Not on file    Non-medical: Not on file  Tobacco Use  . Smoking status: Former Smoker    Packs/day: 1.00    Years: 38.00    Pack years: 38.00    Types: Cigarettes    Last attempt to quit: 02/09/2005    Years since quitting: 12.3  . Smokeless tobacco: Never Used  . Tobacco comment: "quit smoking cigarrettes 2006"  Substance and Sexual Activity  . Alcohol use: No    Alcohol/week: 0.0 oz  . Drug use: No  . Sexual activity: Not on file  Lifestyle  . Physical activity:    Days per week: Not on file    Minutes per session: Not on file  . Stress: Not on file  Relationships  . Social connections:    Talks on phone: Not on file    Gets together: Not on file    Attends religious service: Not on file    Active member of club or organization: Not on file    Attends meetings of clubs or organizations: Not on file    Relationship status: Not on file  . Intimate partner violence:    Fear of current or ex partner: Not on file    Emotionally abused: Not on file    Physically abused: Not on file    Forced sexual activity: Not on file  Other Topics Concern  . Not on file  Social History Narrative  .  Not on file    Family History  Problem Relation Age of Onset  . Breast cancer Paternal Aunt   . Diabetes Sister   . Ulcers Father   . Heart disease Father   . Heart failure Father   . Heart attack Father   . AAA (abdominal aortic aneurysm) Father   . Stroke Mother   . Hypertension Mother   . Colon cancer Neg Hx     ROS- All systems are reviewed and negative except as per the HPI above  Physical Exam: Vitals:   06/30/17 1000  BP: 118/64  Pulse: 94  Weight: 132 lb (59.9 kg)  Height: 5' 5.5" (1.664 m)   Wt Readings from Last 3 Encounters:  06/30/17 132 lb (59.9 kg)  05/26/17 139 lb 12.8 oz (63.4 kg)  05/02/17 135 lb 12.8 oz (61.6 kg)    Labs: Lab Results  Component Value Date   NA 143 05/30/2017   K 5.0 05/30/2017   CL 107 (H) 05/30/2017   CO2 19 (L) 05/30/2017   GLUCOSE 128 (H) 05/30/2017   BUN 22 05/30/2017   CREATININE 1.33 (H) 05/30/2017   CALCIUM 8.8 05/30/2017   MG 2.0 08/01/2014   Lab Results  Component Value Date   INR 1.08 01/24/2011   Lab Results  Component Value Date   CHOL 172 08/01/2014   HDL 66 08/01/2014   LDLCALC 88 08/01/2014   TRIG 90 08/01/2014     GEN- The patient is well appearing, alert and oriented x 3 today.   Head- normocephalic, atraumatic Eyes-  Sclera clear, conjunctiva pink Ears- hearing intact Oropharynx- clear Neck- supple, no JVP Lymph- no cervical lymphadenopathy Lungs- Clear  to ausculation bilaterally, normal work of breathing Heart- Regular rate and rhythm, no murmurs, rubs or gallops, PMI not laterally displaced GI- soft, NT, ND, + BS Extremities- no clubbing, cyanosis, or edema MS- no significant deformity or atrophy Skin- no rash or lesion Psych- euthymic mood, full affect Neuro- strength and sensation are intact  EKG-afib at 94 bpm, RBBB, LAFB, qtc 522 ms Epic records reviewed    Assessment and Plan: 1. Paroxysmal afib in the setting of intermittent recurrent  fever x 3 weeks Afib seems to coincide  with fever For now he is rate controlled but has some soft BP's at home, so will not increase daily rate control I feel that the fevers are aggravating the afib and it is very important for him to f/u with his PCP asap Appointment obtained for Monday at 10 am  For now will give 30 mg cardizem to use if v rates are sustained over 100 and Sys BP is over 100 If anginal symptoms change in pattern from his usual/escalate to the ER for evaluation TSH/cbc/cmet today and will forward to PCP   I will see back in around 10 days or when fevers have been evaluated, for further afib management, if still present  Butch Penny C. Breeanna Galgano, [facility] [address] [place], [place] [address] [phone]

## 2017-06-30 NOTE — Patient Instructions (Signed)
Use Cardizem 30 mg, Take one tablet by mouth every 4 hours AS NEEDED for A-fib HR > 100 as long as BP > 100.  See Primary Monday 07/03/17 at 10:00 a.m.  Follow up with Butch Penny in 10 days.

## 2017-07-03 NOTE — Addendum Note (Signed)
Encounter addended by: Sherran Needs, NP on: 07/03/2017 12:02 PM  Actions taken: Result note filed, Results reviewed in IB

## 2017-07-10 ENCOUNTER — Ambulatory Visit: Payer: Medicare Other | Admitting: Cardiology

## 2017-07-10 ENCOUNTER — Ambulatory Visit (HOSPITAL_COMMUNITY): Payer: Medicare Other | Admitting: Nurse Practitioner

## 2017-07-12 ENCOUNTER — Encounter

## 2017-07-12 ENCOUNTER — Ambulatory Visit: Payer: Medicare Other | Admitting: Cardiovascular Disease

## 2017-07-14 ENCOUNTER — Encounter (HOSPITAL_COMMUNITY): Payer: Self-pay | Admitting: Nurse Practitioner

## 2017-07-14 ENCOUNTER — Ambulatory Visit (HOSPITAL_COMMUNITY)
Admission: RE | Admit: 2017-07-14 | Discharge: 2017-07-14 | Disposition: A | Discharge status: Home / Self Care | Payer: Medicare Other | Source: Ambulatory Visit | Attending: Nurse Practitioner | Admitting: Nurse Practitioner

## 2017-07-14 VITALS — BP 136/58 | HR 70 | Ht 65.5 in | Wt 133.2 lb

## 2017-07-14 DIAGNOSIS — Z951 Presence of aortocoronary bypass graft: Secondary | ICD-10-CM | POA: Diagnosis not present

## 2017-07-14 DIAGNOSIS — K219 Gastro-esophageal reflux disease without esophagitis: Secondary | ICD-10-CM | POA: Insufficient documentation

## 2017-07-14 DIAGNOSIS — Z87891 Personal history of nicotine dependence: Secondary | ICD-10-CM | POA: Diagnosis not present

## 2017-07-14 DIAGNOSIS — I2510 Atherosclerotic heart disease of native coronary artery without angina pectoris: Secondary | ICD-10-CM | POA: Diagnosis not present

## 2017-07-14 DIAGNOSIS — I480 Paroxysmal atrial fibrillation: Secondary | ICD-10-CM

## 2017-07-14 DIAGNOSIS — Z79899 Other long term (current) drug therapy: Secondary | ICD-10-CM | POA: Diagnosis not present

## 2017-07-14 DIAGNOSIS — I252 Old myocardial infarction: Secondary | ICD-10-CM | POA: Diagnosis not present

## 2017-07-14 DIAGNOSIS — R0602 Shortness of breath: Secondary | ICD-10-CM | POA: Insufficient documentation

## 2017-07-14 DIAGNOSIS — Z9889 Other specified postprocedural states: Secondary | ICD-10-CM | POA: Diagnosis not present

## 2017-07-14 DIAGNOSIS — R079 Chest pain, unspecified: Secondary | ICD-10-CM | POA: Insufficient documentation

## 2017-07-14 DIAGNOSIS — Z7982 Long term (current) use of aspirin: Secondary | ICD-10-CM | POA: Insufficient documentation

## 2017-07-14 NOTE — Progress Notes (Signed)
Primary Care Physician: Garwin Brothers, MD Referring Physician: Venida Jarvis Price/ Dr. Sherlyn Lick is a 72 y.o. male with a h/o severe CAD, underwent CABG in [place] in 1984. In 1996 he required redo CABG, most recently cath in 2012 with PCI to SVG. He also has severe PVD with a a carotid endarterectomy at the time of hs re-do CABG. In 1998 he underwent stenting of the left main. He has renal artery stenosis and underwent stenting in [place] in 2005, also known bilateral SFA occlusions. He has a subclavian artery occlusion with right subclavian stenosis.  HTN, HLD, number of esophageal dilations, long hx of AFib with DCCV in the past, RBBB, chronic diastolic CHF. He saw Dr. Curt Bears in Jan 2019 for persistent afib, was started on amiodarone and had successful cardioversion. He could not tolerate higher doses of amiodarone and is currently taking 100 mg qod.  He has been doing well until the last 3 weeks when he developed fevers with H/A, body aches, anorexia.. Fever was up to 101.9. He felt poorly for 3 days  then improved. These symptoms with intermittent fever and body aches have been occurring every 4-5 days. At home this am with low dose fever   at 99-100. He has also lost 7 lbs since this has been occuring. He has had intermittent afib with the fevers. Yesterday, spiked a fever as well and developed afib again. He is rate controlled in the office. Soft BP's noted at home. He has had some anginal symptom's, but states this is not that unusual for him. He has not seen his PCP, was waiting for his physical in a few weeks. Denies tick bites, being around other people who have been sick, no one else in the household with symptoms, no rashes, or diarrhea/vominting, cough. Has not noted any blood in stool or urine.   F/u in afib clinic. Since I saw last , he saw his PCP for intermittent  fevers. He could not explain etiology. I drew labs and forwarded to PCP. WBC was normal, TSH 0.269,  mildly anemic, stable. His PCP drew more blood and got a CXR. HE did not have explanation  for fevers. However, the pt has not had any fevers now for 2 weeks. He is in SR today. He had chest pain a few days ago and had to use NTG spray. Since then he has felt improved. He has not had any afib since fevers have stopped. He is more short of breath with exertion.  Today, he denies symptoms of palpitations,  shortness of breath, orthopnea, PND, lower extremity edema, dizziness, presyncope, syncope, or neurologic sequela. Intermittent fever, + for intermittent chest pain, shortness of breath with exertion.The patient is tolerating medications without difficulties and is otherwise without complaint today.   Past Medical History:  Diagnosis Date  . Anemia   . Angina   . Atrial fibrillation ([place])   . Blood transfusion   . Bronchitis   . Bulging discs    "8 of them; thoracic, lumbar, sacral area"  . Chronic back pain   . Colon polyps   . Complication of anesthesia   . Coronary artery disease 01/24/11   Successful PCI long segmental stensois  vein graft to the CX marginal vessel-graft previously stented prox. 3.0x50mm TAXUS stent mid seg 3.0x45mm TAXUS stent now tandem stens of 3.0x52mm & 3.0x39mm placed with the seg. 50,80 & 99% stenosis reduced to 0%  . DDD (degenerative disc disease)   .  Dysrhythmia    "PAC's and PVC's"  . Esophageal dilatation    "have had it done 7 times; last time ~ 2001"  . Esophageal stricture   . GERD (gastroesophageal reflux disease)   . Headache(784.0)   . Hiatal hernia   . Hyperlipidemia   . Myocardial infarction ([place]) 1991  . Myocardial infarction [facility]) 2005   "had 3 heart attacks this year"  . Paroxysmal atrial fibrillation (H[facility])   . Pyloric stenosis   . RBBB   . Renal artery stenosis ([facility])    hhistory of right renal artery stenting and re-intervention for "in-stent restenosis.  . S/P CABG (coronary artery bypass graft) [place]  . Shortness of breath    "when I had the heart attacks"  . Subclavian artery stenosis, left ([place])   . Ulcer    "small; Dr. Henrene Pastor found it 02/2010"   Past Surgical History:  Procedure Laterality Date  . ADENOIDECTOMY     "when I was an infant"  . APPENDECTOMY  1953  . CARDIOVERSION N/A 04/17/2017   Procedure: CARDIOVERSION;  Surgeon: Jerline Pain, MD;  Location: [facility] ENDOSCOPY;  Service: Cardiovascular;  Laterality: N/A;  . CAROTID ENDARTERECTOMY Right [place]  01/24/11   "today makes a total of 9 stents"  . CORONARY ARTERY BYPASS GRAFT  1984   CABG X 4  . CORONARY ARTERY BYPASS GRAFT  1996   CABG X 4  . LEFT HEART CATHETERIZATION WITH CORONARY/GRAFT ANGIOGRAM N/A 01/24/2011   Procedure: LEFT HEART CATHETERIZATION WITH Beatrix Fetters;  Surgeon: Troy Sine, MD;  Location: [facility] CATH LAB;  Service: Cardiovascular;  Laterality: N/A;  . pylondial cyst removal    . RETINAL DETACHMENT SURGERY  04/2007   right  . TONSILLECTOMY  1972    Current Outpatient Medications  Medication Sig Dispense Refill  . amiodarone (PACERONE) 200 MG tablet Take 0.5 tablets (100 mg total) by mouth every other day. 30 tablet 6  . apixaban (ELIQUIS) 5 MG TABS tablet Take 1 tablet (5 mg total) by mouth 2 (two) times daily. 180 tablet 3  . butalbital-aspirin-caffeine (FIORINAL) 50-325-40 MG per capsule Take 1 capsule by mouth every 4 (four) hours as needed for headache.     . diltiazem (CARDIZEM) 30 MG tablet Take one tablet by mouth every 4 hours AS NEEDED for A-fib HR > 100 as long as BP > 100. 30 tablet 1  . dutasteride (AVODART) 0.5 MG capsule Take 0.5 mg by mouth daily.     Marland Kitchen esomeprazole (NEXIUM) 40 MG capsule Take 1 capsule (40 mg total) by mouth 2 (two) times daily. 180 capsule 3  . Ketotifen Fumarate (ALAWAY OP) Place 1 drop into the right eye daily.    Marland Kitchen loratadine (CLARITIN) 10 MG tablet Take 10 mg by mouth daily as needed for allergies,  rhinitis or itching.   3  . lovastatin (MEVACOR) 40 MG tablet Take 20 mg by mouth at bedtime.    . metoprolol tartrate (LOPRESSOR) 50 MG tablet Take 1.5 tablets (75 mg total) by mouth 2 (two) times daily. 270 tablet 3  . Multiple Vitamins-Minerals (MULTIVITAMINS THER. W/MINERALS) TABS Take 1 tablet by mouth daily.      . ranolazine (RANEXA) 1000 MG SR tablet Take 500 mg by mouth 2 (two) times daily.    Marland Kitchen torsemide (DEMADEX) 20 MG tablet Take 20 mg by mouth as needed (for fluid).  FOR SWELLING OR WEIGHT GAIN    . vitamin E 400 UNIT capsule Take 400 Units by mouth daily.     . nitroGLYCERIN (NITROLINGUAL) 0.4 MG/SPRAY spray PLACE ONE SPRAY UNDER TONGUE EVERY 5 MINUITES FOR UP TO 3 DOSES AS NEEDED FOR CHEST PAIN (Patient not taking: Reported on 07/14/2017) 4.9 g 3   No current facility-administered medications for this encounter.     Allergies  Allergen Reactions  . Codeine Nausea And Vomiting  . Sulfonamide Derivatives Rash    Social History   Socioeconomic History  . Marital status: Married    Spouse name: Not on file  . Number of children: Not on file  . Years of education: Not on file  . Highest education level: Not on file  Occupational History  . Not on file  Social Needs  . Financial resource strain: Not on file  . Food insecurity:    Worry: Not on file    Inability: Not on file  . Transportation needs:    Medical: Not on file    Non-medical: Not on file  Tobacco Use  . Smoking status: Former Smoker    Packs/day: 1.00    Years: 38.00    Pack years: 38.00    Types: Cigarettes    Last attempt to quit: 02/09/2005    Years since quitting: 12.4  . Smokeless tobacco: Never Used  . Tobacco comment: "quit smoking cigarrettes 2006"  Substance and Sexual Activity  . Alcohol use: No    Alcohol/week: 0.0 oz  . Drug use: No  . Sexual activity: Not on file  Lifestyle  . Physical activity:    Days per week: Not on file    Minutes per session: Not on file  . Stress: Not on  file  Relationships  . Social connections:    Talks on phone: Not on file    Gets together: Not on file    Attends religious service: Not on file    Active member of club or organization: Not on file    Attends meetings of clubs or organizations: Not on file    Relationship status: Not on file  . Intimate partner violence:    Fear of current or ex partner: Not on file    Emotionally abused: Not on file    Physically abused: Not on file    Forced sexual activity: Not on file  Other Topics Concern  . Not on file  Social History Narrative  . Not on file    Family History  Problem Relation Age of Onset  . Breast cancer Paternal Aunt   . Diabetes Sister   . Ulcers Father   . Heart disease Father   . Heart failure Father   . Heart attack Father   . AAA (abdominal aortic aneurysm) Father   . Stroke Mother   . Hypertension Mother   . Colon cancer Neg Hx     ROS- All systems are reviewed and negative except as per the HPI above  Physical Exam: Vitals:   07/14/17 1121  BP: (!) 136/58  Pulse: 70  Weight: 133 lb 3.2 oz (60.4 kg)  Height: 5' 5.5" (1.664 m)   Wt Readings from Last 3 Encounters:  07/14/17 133 lb 3.2 oz (60.4 kg)  06/30/17 132 lb (59.9 kg)  05/26/17 139 lb 12.8 oz (63.4 kg)    Labs: Lab Results  Component Value Date   NA 134 (L) 06/30/2017   K 4.7 06/30/2017   CL 101 06/30/2017  CO2 23 06/30/2017   GLUCOSE 126 (H) 06/30/2017   BUN 28 (H) 06/30/2017   CREATININE 1.54 (H) 06/30/2017   CALCIUM 8.7 (L) 06/30/2017   MG 2.0 08/01/2014   Lab Results  Component Value Date   INR 1.08 01/24/2011   Lab Results  Component Value Date   CHOL 172 08/01/2014   HDL 66 08/01/2014   LDLCALC 88 08/01/2014   TRIG 90 08/01/2014     GEN- The patient is well appearing, alert and oriented x 3 today.   Head- normocephalic, atraumatic Eyes-  Sclera clear, conjunctiva pink Ears- hearing intact Oropharynx- clear Neck- supple, no JVP Lymph- no cervical  lymphadenopathy Lungs- Clear to ausculation bilaterally, normal work of breathing Heart- Regular rate and rhythm, no murmurs, rubs or gallops, PMI not laterally displaced GI- soft, NT, ND, + BS Extremities- no clubbing, cyanosis, or edema MS- no significant deformity or atrophy Skin- no rash or lesion Psych- euthymic mood, full affect Neuro- strength and sensation are intact  EKG- NSR at 70 bpm, RBBB, LAFB, pr int 204 ms, qrs int 142 ms, qtc 516 ms Epic records reviewed    Assessment and Plan: 1. Paroxysmal afib in the setting of intermittent recurrent  fever x 3 weeks Afib seemed to coincide with fever Fever has now resolved, x 2 weeks. ? Etiology If fever returns, pt will need Blood cultures He has 30 mg cardizem to use if v rates are sustained over 100 and Sys BP is over 100 Continue amiodarone 100 mg qod Continue Eliquis 5 mg bid, chadsvasc score is at least 2  2. Chest pain/shortness of breath/CAD Pt feels he is more symptomatic with this Took NTG the other day and chest pain symptoms resolved quickly but feels his exertional dyspnea is worse CABG with redo and mutiple stents I will request that he see Dr. Claiborne Billings or APP in the next 1-2 weeks   Butch Penny C. Bernardina Cacho, [facility] [address] [place], [place] [address] [phone]

## 2017-07-18 ENCOUNTER — Encounter: Payer: Self-pay | Admitting: Cardiovascular Disease

## 2017-07-18 ENCOUNTER — Ambulatory Visit: Payer: Medicare Other | Admitting: Cardiology

## 2017-07-18 ENCOUNTER — Ambulatory Visit: Payer: Medicare Other | Admitting: Cardiovascular Disease

## 2017-07-18 VITALS — BP 120/58 | HR 63 | Ht 65.5 in | Wt 136.4 lb

## 2017-07-18 DIAGNOSIS — Z951 Presence of aortocoronary bypass graft: Secondary | ICD-10-CM

## 2017-07-18 DIAGNOSIS — R7989 Other specified abnormal findings of blood chemistry: Secondary | ICD-10-CM | POA: Diagnosis not present

## 2017-07-18 DIAGNOSIS — E785 Hyperlipidemia, unspecified: Secondary | ICD-10-CM | POA: Diagnosis not present

## 2017-07-18 DIAGNOSIS — I480 Paroxysmal atrial fibrillation: Secondary | ICD-10-CM

## 2017-07-18 DIAGNOSIS — I25119 Atherosclerotic heart disease of native coronary artery with unspecified angina pectoris: Secondary | ICD-10-CM

## 2017-07-18 DIAGNOSIS — Z7901 Long term (current) use of anticoagulants: Secondary | ICD-10-CM | POA: Diagnosis not present

## 2017-07-18 DIAGNOSIS — R600 Localized edema: Secondary | ICD-10-CM

## 2017-07-18 DIAGNOSIS — I739 Peripheral vascular disease, unspecified: Secondary | ICD-10-CM

## 2017-07-18 NOTE — Patient Instructions (Signed)
Medication Instructions:  Your physician recommends that you continue on your current medications as directed. Please refer to the Current Medication list given to you today.  Follow-Up: 4 months with Dr. Claiborne Billings    If you need a refill on your cardiac medications before your next appointment, please call your pharmacy.

## 2017-07-18 NOTE — Progress Notes (Signed)
Patient ID: [name], male   DOB: 02/09/1946, 72 y.o.   MRN: [id number]    Primary MD:  Dr. Saad Amin  HPI: [name] is a 72 y.o. male who is a former patient of Dr. Weintraub.  He presents for a 2 month follow-up cardiology evaluation.  [name] has an extensive cardiac and peripheral vascular history. He underwent initial CABG revascularization surgery in [place] in 1984. In 1996 he required re-do CABG revascularization surgery after all grafts were found to be occluded. At that time he also underwent right carotid endarterectomy. In 1998 he underwent stenting of his left main coronary artery and also had stenting done to the vein graft supplying the marginal vessel. He has history of right renal artery stenosis and underwent initial stenting with predilatation 2005. He has documented chronic right bundle branch block. He also significant peripheral vascular disease with bilateral SFA occlusion. He has known left subclavian artery occlusion with right subclavian stenosis. Has a history of hypertension, hyperlipidemia, and remotely smoked cigarettes. His last catheterization was done in November 2012 which showed significant native CAD with evidence for patent left main stent. He had an occluded circumflex marginal vessel from the AV groove circumflex. There was 80% ostial RCA stenosis with 90% stenosis in the mid right coronary artery proximal to the RV marginal branch and total occlusion of the mid RCA. An occluded vein graft supplying the right coronary artery but evidence for collateralization to the distal right artery the left coronary injection and injection of the grafts. The vein supplying the marginal vessel with the previously placed ostial proximal Taxus stent had in-stent narrowing of 50% followed by 95% stenosis in the body of the proximal third of the vessel graft prior to the previously placed mid grafts 3.0x20 mm Taxus stent. At that time, he underwent difficult but  successful intervention involving long segmental stenosis of the vein graft supplying the circumflex marginal vessel. Additional tandem 3.0x38 mm and 3.0x8 mm Taxus stents were inserted with excellent angiographic result.   He saw Dr. Weintraub in August 2014 and due to concern for progressive carotid stenoses repeat Doppler studies were done and he also saw Dr. Todd early for followup evaluation. Repeat imaging showed a patent right carotid endarterectomy site the Doppler velocity suggestive of 40  percent stenosis of the bilateral proximal internal carotid arteries. There was left subclavian steal noted with retrograde left vertebral artery flow, monophasic left subclavian artery flow and significant difference in the bilateral brachial pressures.  He established cardiology care with me in December 2014.  A nuclear perfusion study in May 2015 demonstrated mild inferobasal ischemia, most likely secondary to RCA distribution.    On 03/25/2014 he was seen by Dr. Jordan after he developed an episode of atrial fibrillation the day before.  An ECG in Dr. Amin's  office showed atrial fibrillation with rate of 120.  His atenolol was increased to 50 mg twice a day, and he converted to sinus rhythm after 18 hours.  When he saw Dr. Jordan the following day he was in sinus rhythm.  On the increased dose of beta blocker he denies recurrent palpitations.  He tells me he recently underwent a colonoscopy and had 3 polyps removed, but also required cauterization for another site that was oozing blood.  This reason, he was not restarted on anticoagulation but has been maintained on low-dose aspirin and Plavix.  He has seen Dr. Berry in follow-up of his PVD and is status post   left renal stenting.  He also is followed by Dr. Early for his carotid disease.  In August 2017 he was seen by [name] after he was found to be in atrial fibrillation 1 week previously at cardiac rehabilitation.  He was started on amiodarone 200 mg  twice a day for 1 week and then 200 mg daily and his atenolol was reduced to 25 mg twice a day.  He was seen several weeks later on 10/29/2015 by [name] and was back in sinus rhythm.  He underwent carotid duplex imaging on 12/23/2015.  The right carotid endarterectomy site had Doppler velocities suggestive of a 60-79% stenosis. The left internal carotid artery velocity suggested a 40-59% stenoses.  Renal duplex imaging to just a patent right renal artery stent.  There was stable greater than 60% left renal artery stenosis.    When I  saw him in November 2017 he was maintaining sinus rhythm and  since he was on amiodarone, I recommended he reduce lovastatin to 20 mg and added Zetia for continued aggressive lipid management.  He had progressive lower extremity edema and recommended he increase his torsemide.  In March 2018 he developed episodes of bronchitis.  Since that time his rhythm has been irregular.  He wore a Holter monitor which showed atrial fibrillation with a minimum rate at 52 at 4:02 AM was sleeping and maximum rate at 119.  In the afternoon with an average rate of 82 bpm.  There are frequent PVCs, rare couplets, occasional episodes of bigeminy and trigeminy but no episodes of VT.  There were no pauses.  Previously, his blood pressure had become low and hehad been on losartan and I saw him one month ago there was leg swelling, left greater than right.  He had a chest x-ray with Dr. Amin on 06/21/2016.  A  follow-up carotid Doppler study in April 2018 showed improvement in his velocities such that his right carotid endarterectomy was patent with velocities now suggestive of 40-59% stenosis.  The left internal carotid velocities suggested a 1-39% stenosis.   When I saw him in May 2018, he was in atrial fibrillation.  I discontinued Plavix and started him on eliquis 5 mg twice a day.  I recommended that he not to take aspirin with his GI history.  I increased amiodarone to 200 mg twice a day  and reduced his Ranexa dose to 500 mg twice a day with concomitant therapy.  He was having lower extremity edema.  A Doppler study was negative for DVT by his primary physician.  I recommended support stockings.    I saw him 1 month later and he continued to be in atrial fibrillation.  At that time, we discussed potential DC cardioversion for restoration of sinus rhythm.  He underwent repeat echo on 08/24/2016 which showed an EF of 55-60%.  His left atrium was normal in size and his right atrium was mildly dilated.  PA pressure was increased at 48 mm.  There was moderate LVH. .  He apparently was seen subsequent to that evaluation by Hao Meng and was having 2+ bilateral lower extremity edema.  Further discussion concerning cardioversion was undertaken but the patient initially deferred at that point and wanted to that it further.  He tells me 1 month ago he noticed some bright red blood per rectum.  As result, he self reduced his eliquis from 5 mg twice a day down to 5 mg daily.  Remotely, he has seen Dr. John Perry in   the past.  His last colonoscopy was at least 5 years ago.  He had stopped taking amiodarone since he felt to contribute to fatigue, tremors, dizziness, and gait staggering.  The symptomshave improved off amiodarone antiarrhythmic therapy.  Her, he has noticed that at times his pulse can increase up to the low 100s with minimal activity.  He has now been using compression stockings for his ankle edema which has improved.    When I last saw him in November 2018 , I recommended eliquis 5 mg twice a day for full anticoagulation.  I started him on metoprolol 50 mg twice a day  I discussed options concerning continuing permanent AF versus possible Tikosyn or AF ablation and referred him for  EP evaluation.  He saw Dr. Curt Bears in January 2019. Due to his QTC prolongation  he was not felt to be a candidate for Tikosyn or sotalol a and also was felt to be high risk for ablation.  As result he recommended  reiitiation of low-dose amiodarone.n 04/12/2017.  He underwent successful cardioversion.  When he saw Kerin Ransom on 04/19/2017.  At that time, the patient had complaints of increasing dyspnea on exertion and orthopnea, which were similar to his previous symptoms on amiodarone.  When he last saw Shanon Brow a on May 02, 2017 he also complained of a fine tremor since institution of amiodarone, and amiodarone  was reduced to 100 mg every other day.   He underwent carotid duplex imaging which showed mild bilateral carotid plaque 40-59% on the right and 1-39% in the left.  In addition, an abdominal renal duplex study showed patent right renal artery stent, moderate left renal artery stenosis, and significant SMA stenosis.  This was reviewed by Dr. Gwenlyn Found.  When I last saw him in March 2019 he believes his tremor and loss of coordination on the higher dose of amiodarone has improved with low-dose amiodarone.  He wasnot been taking Demadex for the past 10 days and this has resulted in increasing leg swelling.  Subsequently, he has been seen in the atrial fibrillation clinic by Orson Eva several occasions, last seen Jul 14, 2017.  Prior to that evaluation he had seen his PCP for intermittent fevers.  When last seen in A. fib clinic he was in sinus rhythm.  Last week he had experienced an episode of chest pain which he took sublingual nitroglycerin spray.  He denies any recurrent symptoms.  He continues to be on amiodarone 100 mg every other day and Eliquis for anticoagulation.  He is on metoprolol 75 mg twice a day, Ranexa 500 mg twice a day and his dose was reduced when amiodarone was instituted.  He is on lovastatin 40 mg for hyperlipidemia.  He has been taking torsemide on an as-needed basis.  He has a prescription for diltiazem 30 mg to take if his heart rate increases above 100 and long as blood pressure is greater than 100.  He presents for reevaluation.  Past Medical History:  Diagnosis Date  . Anemia    . Angina   . Atrial fibrillation ([place])   . Blood transfusion   . Bronchitis   . Bulging discs    "8 of them; thoracic, lumbar, sacral area"  . Chronic back pain   . Colon polyps   . Complication of anesthesia   . Coronary artery disease 01/24/11   Successful PCI long segmental stensois  vein graft to the CX marginal vessel-graft previously stented prox. 3.0x51m TAXUS stent mid seg 3.0x271m  TAXUS stent now tandem stens of 3.0x9m & 3.0x869mplaced with the seg. 50,80 & 99% stenosis reduced to 0%  . DDD (degenerative disc disease)   . Dysrhythmia    "PAC's and PVC's"  . Esophageal dilatation    "have had it done 7 times; last time ~ 2001"  . Esophageal stricture   . GERD (gastroesophageal reflux disease)   . Headache(784.0)   . Hiatal hernia   . Hyperlipidemia   . [facility]  [facility]   "had 3 heart attacks this year"  . Paroxysmal atrial fibrillation ([facility])   . Pyloric stenosis   . RBBB   . Renal artery stenosis ([facility])    hhistory of right renal artery stenting and re-intervention for "in-stent restenosis.  . S/P CABG (coronary artery bypass graft) [place]. Shortness of breath    "when I had the heart attacks"  . Subcl[place] artery stenosis, left ([facility] [place]  . Ulcer    "small; Dr. PeHenrene Pastoround it 02/2010"    Past Surgical History:  Procedure Laterality Date  . ADENOIDECTOMY     "when I was an infant"  . APPENDECTOMY  1953  . CARDIOVERSION N/A 04/17/2017   Procedure: CARDIOVERSION;  Surgeon: SkJerline PainMD;  [facility];  Service: Cardiovascular;  Laterality: N/A;  . CAROTID ENDARTERECTOMY Right [place]11/19/12   "today makes a total of 9 stents"  . CORONARY ARTERY BYPASS GRAFT  1984   CABG X 4  . CORONARY ARTERY BYPASS GRAFT  1996   CABG X 4  . LEFT HEART CATHETERIZATION WITH CORONARY/GRAFT ANGIOGRAM N/A 01/24/2011    Procedure: LEFT HEART CATHETERIZATION WITH COBeatrix Fetters Surgeon: ThTroy SineMD;  [facility];  Service: Cardiovascular;  Laterality: N/A;  . pylondial cyst removal    . RETINAL DETACHMENT SURGERY  04/2007   right  . TONSILLECTOMY  1972    Allergies  Allergen Reactions  . Codeine Nausea And Vomiting  . Sulfa Antibiotics Rash  . Sulfonamide Derivatives Rash    Current Outpatient Medications  Medication Sig Dispense Refill  . amiodarone (PACERONE) 200 MG tablet Take 0.5 tablets (100 mg total) by mouth every other day. 30 tablet 6  . apixaban (ELIQUIS) 5 MG TABS tablet Take 1 tablet (5 mg total) by mouth 2 (two) times daily. 180 tablet 3  . butalbital-aspirin-caffeine (FIORINAL) 50-325-40 MG per capsule Take 1 capsule by mouth every 4 (four) hours as needed for headache.     . diltiazem (CARDIZEM) 30 MG tablet Take one tablet by mouth every 4 hours AS NEEDED for A-fib HR > 100 as long as BP > 100. 30 tablet 1  . dutasteride (AVODART) 0.5 MG capsule Take 0.5 mg by mouth daily.     . Marland Kitchensomeprazole (NEXIUM) 40 MG capsule Take 1 capsule (40 mg total) by mouth 2 (two) times daily. 180 capsule 3  . Ketotifen Fumarate (ALAWAY OP) Place 1 drop into the right eye daily.    . Marland Kitchenoratadine (CLARITIN) 10 MG tablet Take 10 mg by mouth daily as needed for allergies, rhinitis or itching.   3  . lovastatin (MEVACOR) 40 MG tablet Take 20 mg by mouth at bedtime.    . metoprolol tartrate (LOPRESSOR) 50 MG tablet Take 1.5 tablets (75 mg total) by mouth 2 (two) times daily. 270 tablet 3  .  Multiple Vitamins-Minerals (MULTIVITAMINS THER. W/MINERALS) TABS Take 1 tablet by mouth daily.      . nitroGLYCERIN (NITROLINGUAL) 0.4 MG/SPRAY spray PLACE ONE SPRAY UNDER TONGUE EVERY 5 MINUITES FOR UP TO 3 DOSES AS NEEDED FOR CHEST PAIN 4.9 g 3  . ranolazine (RANEXA) 1000 MG SR tablet Take 500 mg by mouth 2 (two) times daily.    Marland Kitchen torsemide (DEMADEX) 20 MG tablet Take 20 mg by mouth as needed (for  fluid). FOR SWELLING OR WEIGHT GAIN    . vitamin E 400 UNIT capsule Take 400 Units by mouth daily.      No current facility-administered medications for this visit.     Social History   Socioeconomic History  . Marital status: Married    Spouse name: Not on file  . Number of children: Not on file  . Years of education: Not on file  . Highest education level: Not on file  Occupational History  . Not on file  Social Needs  . Financial resource strain: Not on file  . Food insecurity:    Worry: Not on file    Inability: Not on file  . Transportation needs:    Medical: Not on file    Non-medical: Not on file  Tobacco Use  . Smoking status: Former Smoker    Packs/day: 1.00    Years: 38.00    Pack years: 38.00    Types: Cigarettes    Last attempt to quit: 02/09/2005    Years since quitting: 12.4  . Smokeless tobacco: Never Used  . Tobacco comment: "quit smoking cigarrettes 2006"  Substance and Sexual Activity  . Alcohol use: No    Alcohol/week: 0.0 oz  . Drug use: No  . Sexual activity: Not on file  Lifestyle  . Physical activity:    Days per week: Not on file    Minutes per session: Not on file  . Stress: Not on file  Relationships  . Social connections:    Talks on phone: Not on file    Gets together: Not on file    Attends religious service: Not on file    Active member of club or organization: Not on file    Attends meetings of clubs or organizations: Not on file    Relationship status: Not on file  . Intimate partner violence:    Fear of current or ex partner: Not on file    Emotionally abused: Not on file    Physically abused: Not on file    Forced sexual activity: Not on file  Other Topics Concern  . Not on file  Social History Narrative  . Not on file   Socially he is married has 2 children 2 stepchildren. He quit tobacco in 2000.  Family History  Problem Relation Age of Onset  . Breast cancer Paternal Aunt   . Diabetes Sister   . Ulcers Father   .  Heart disease Father   . Heart failure Father   . Heart attack Father   . AAA (abdominal aortic aneurysm) Father   . Stroke Mother   . Hypertension Mother   . 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 Henrene Pastor. GU: Occasional  concentrated or  dark urine without blood Musculoskeletal: Negative; no myalgias, joint pain, or weakness Hematologic/Oncology: Negative; no easy bruising, bleeding Endocrine: Negative; no heat/cold intolerance; no diabetes Neuro: Negative; no changes in balance, headaches Skin: Negative; No rashes or skin lesions Psychiatric: Negative; No behavioral problems, depression Sleep: Negative; No snoring, daytime sleepiness, hypersomnolence, bruxism, restless legs, hypnogognic hallucinations, no cataplexy Other comprehensive 14 point system review is negative.   PE BP (!) 120/58   Pulse 63   Ht 5' 5.5" (1.664 m)   Wt 136 lb 6.4 oz (61.9 kg)   BMI 22.35 kg/m    Repeat blood pressure by me was 132/64  Wt Readings from Last 3 Encounters:  07/18/17 136 lb 6.4 oz (61.9 kg)  07/14/17 133 lb 3.2 oz (60.4 kg)  06/30/17 132 lb (59.9 kg)   General: Alert, oriented, no distress.  Skin: normal turgor, no rashes, warm and dry HEENT: Normocephalic, atraumatic. Pupils equal round and reactive to light; sclera anicteric; extraocular muscles intact;  Nose without nasal septal hypertrophy Mouth/Parynx benign; Mallinpatti scale 3 Neck: No JVD, no carotid bruits; normal carotid upstroke Lungs: clear to ausculatation and percussion; no wheezing or rales Chest wall: without tenderness to palpitation Heart: PMI not displaced, RRR, s1 s2 normal, 1/6 systolic  murmur, no diastolic murmur, no rubs, gallops, thrills, or heaves Abdomen: soft, nontender; no hepatosplenomehaly, BS+; abdominal aorta nontender and not dilated by palpation. Back: no CVA tenderness Pulses 2+ Musculoskeletal: full range of motion, normal strength, no joint deformities Extremities: Provement and previous 2+ pitting edema on the left and 1+ on the right, now trace; no clubbing cyanosis or edema, Homan's sign negative  Neurologic: grossly nonfocal; Cranial nerves grossly wnl Psychologic: Normal mood and affect   ECG (independently read by me): Normal sinus rhythm at 63 bpm.  Right bundle branch block with repolarization changes.  Nonspecific ST changes.  QTc interval 493 ms.    March 2019 ECG (independently read by me): normal sinus rhythm at 64 bpm.  Right bundle branch block with repolarization changes.  Left posterior hemiblock.  QTc interval 497 ms.    November 2018 ECG (independently read by me): Atrial flutter with variable block.  PVC.  Left posterior hemiblock.  Right bundle branch block.  June 2018 ECG (independently read by me): Atrial fibrillation with ventricular rate at 70 bpm.  Right bundle-branch block with repolarization changes.  Probable left posterior fascicular block.  07/21/2016 ECG (independently read by me): atrial fibrillation with a controlled ventricular rate at 71 bpm.  Right bundle branch block and left posterior hemiblock.  Small inferior Q waves.  November 2017 ECG (independently read by me): Sinus rhythm with a PVC.  Right bundle branch block and probable left posterior hemiblock.  Inferior Q waves with preserved R waves.  February 2017 ECG (independently read by me): normal sinus rhythm at 61 bpm. Probable left atrial enlargement. Right bundle branch block.  ECG (independently read by me): Sinus bradycardia 58 bpm.  Increased QTc interval 473 ms.  Right bundle-branch block.  May 2016 ECG (independently read by me): Sinus bradycardia 59 bpm with  right bundle branch block.I ST-T changes.  QTc interval 475 ms.  PR interval 182 ms.  No ectopy.  February 2016 ECG (independently read by me) normal sinus rhythm at 63 bpm.  Right bundle-branch block.  Probable left posterior fascicular block.  PR interval 170 ms.  QTC interval 480 ms.  November 2015 ECG (independently read by me): Normal sinus rhythm at 62 bpm.  Right bundle branch block.  Small nondiagnostic inferior Q waves.  Prior May 2015ECG : sinus rhythm at 58 beats per minute.  Right bundle branch block.  Prior December 2014 ECG: Sinus rhythm with sinus bradycardia 55 beats per minute. Right bundle branch block.  LABS: BMP Latest Ref Rng & Units 06/30/2017 05/30/2017 05/02/2017  Glucose 65 - 99 mg/dL 126(H) 128(H) 97  BUN 6 - 20 mg/dL 28(H) 22 26  Creatinine 0.61 - 1.24 mg/dL 1.54(H) 1.33(H) 1.31(H)  BUN/Creat Ratio 10 - 24 - 17 20  Sodium 135 - 145 mmol/L 134(L) 143 141  Potassium 3.5 - 5.1 mmol/L 4.7 5.0 4.7  Chloride 101 - 111 mmol/L 101 107(H) 104  CO2 22 - 32 mmol/L 23 19(L) 23  Calcium 8.9 - 10.3 mg/dL 8.7(L) 8.8 8.9   Hepatic Function Latest Ref Rng & Units 06/30/2017 05/30/2017 05/02/2017  Total Protein 6.5 - 8.1 g/dL 6.5 6.1 6.2  Albumin 3.5 - 5.0 g/dL 3.2(L) 4.1 3.8  AST 15 - 41 U/L '22 19 17  '$ ALT 17 - 63 U/L 16(L) 12 12  Alk Phosphatase 38 - 126 U/L 98 109 92  Total Bilirubin 0.3 - 1.2 mg/dL 0.6 0.2 0.2  Bilirubin, Direct 0.00 - 0.40 mg/dL - - 0.11   CBC Latest Ref Rng & Units 06/30/2017 05/30/2017 04/17/2017  WBC 4.0 - 10.5 K/uL 7.3 6.1 -  Hemoglobin 13.0 - 17.0 g/dL 11.2(L) 11.4(L) 11.6(L)  Hematocrit 39.0 - 52.0 % 33.0(L) 33.5(L) 34.0(L)  Platelets 150 - 400 K/uL 157 163 -   Lab Results  Component Value Date   MCV 91.2 06/30/2017   MCV 91 05/30/2017   MCV 97.0 11/25/2014   Lab Results  Component Value Date   TSH 0.269 (L) 06/30/2017    Lipid Panel     Component Value Date/Time   CHOL 172 08/01/2014 0852   TRIG 90 08/01/2014 0852   HDL 66 08/01/2014  0852   CHOLHDL 2.6 08/01/2014 0852   VLDL 18 08/01/2014 0852   LDLCALC 88 08/01/2014 0852     RADIOLOGY: No results found.  IMPRESSION:  1. Paroxysmal A-fib ([place])   2. PVD (peripheral vascular disease) ([place])   3. Coronary artery disease involving native coronary artery of native heart with unspecified  angina pectoris ([place]) on anti-ischemic meds   4. Hx of CABG   5. Abnormal thyroid blood test   6. Lower extremity edema   7. Chronic anticoagulation   8. Hyperlipidemia with target LDL less than 70     ASSESSMENT AND PLAN: [name] is a 72 year old gentleman who has an extensive CAD/PVD history, and is 35 years status post his initial CABG revascularization surgery done in [place] in 1984 and 23 years status post his second CABG surgery in 1996. He has had multiple peripheral vascular procedures including carotid endarterectomy, renal stenting, and has documented SFA occlusion. His last catheterization/ intervention in 2012 demonstrated diffuse disease in the vein graft supplying the marginal vessel and 2 additional Taxus stents inserted commencing ostially and extending to the proximal portion of the recently placed mid prior stent. He has a known occluded graft to the RCA with a mid RCA occlusion with left to right collaterals. He also has subclavian steal. He does have mild hypokinesis inferolaterally in the basal inferior wall with an EF of 45% at that catheterization. He has chronic right bundle branch block. He has not been demonstrated to have an abdominal aortic aneurysm. When I had seen him previously he had  complained of mild chest anginal symptomatology typically when he starts exercise and particularly after eating. He had potential areas of myocardium which may be responsible for ischemia and I added  Ranexa 500 mg twice a day to his medical regimen and subsequently this was further titrated to 1000 mg twice a day , which resolved his symptomatology.  Because of issues  with atrial fibrillation and a trial of amiodarone therapy in the past, Ranexa dose was reduced due to concomitant medication.  Since I last saw him, he had undergone EP evaluation for other options.  In the past he did not tolerate amiodarone due to dyspnea.  When amiodarone was restarted, he again started to notice shortness of breath in addition to some fine tremor.  He underwent successful cardioversion.  He is maintaining sinus rhythm but he felt perhaps a week ago he had a sensation of mild chest pain and thought he may have been in AF which resolved with a nitroglycerin spray.  He had recently developed fevers which she is seen his primary care physician of uncertain etiology.  This has resolved.  His blood pressure today is stable on his current regimen consisting of metoprolol 75 mg twice a day.  He has been taking torsemide 20 mg as needed for edema with significant improvement.  He has hyperlipidemia and is on lovastatin 20 mg at bedtime.  He has mild stage III chronic kidney disease.  Most recent creatinine was 1.54 when checked by Roderic Palau.  His TSH level was 0.269.  This will need to be reassessed and may be potentially secondary to amiodarone.  He has mild stable anemia with a hemoglobin of 11.2 hematocrit of 33.  I will see him in 4 months for reevaluation.   Due to his symptoms, he is now on a reduced dose of amiodarone at just 100 mg every other day.  He is maintaining sinus rhythm today without awareness of recurrent atrial fibrillation.  He is now on full dose anticoagulation which I had increased at his last office visit after he had self reduced it to only daily dosing due to GI issues.  He now has 1-2+ bilateral pitting edema after holding his Demadex for the past 10 days.  On May 02, 2017 creatinine was 1.3.  TSH was 0.417.  Presently he denies recurrent bleeding.  His last echo Doppler study in June 2018 showed an EF of 55-60%.  There was mitral annular calcification.  He had  moderate PA hypertension with a peak pressure of 48 mm he continues to be on lovastatin 20 mg for hyperlipidemia.  Target LDL is less than 70.  His level will need to be rechecked.  He is not having recurrent anginal symptomatology on his regimen consisting of Ranexa 500 mg twice a day, metoprolol 75 mg twice a day,.  Blood pressure today is stable.  He continues to be on Nexium for GERD.  I reviewed his carotid studies as well as his renal duplex evaluation.  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  Repeat laboratory will be obtained.  He has a follow-up appointment to see Dr. Curt Bears.  I will see him in 6 months for reevaluation.  Time spent: 25 minutes  Troy Sine, MD, [facility]  07/20/2017 4:20 PM

## 2017-07-20 ENCOUNTER — Encounter: Payer: Self-pay | Admitting: Cardiovascular Disease

## 2017-07-31 DIAGNOSIS — D649 Anemia, unspecified: Secondary | ICD-10-CM | POA: Diagnosis not present

## 2017-07-31 DIAGNOSIS — N289 Disorder of kidney and ureter, unspecified: Secondary | ICD-10-CM | POA: Diagnosis not present

## 2017-11-21 ENCOUNTER — Ambulatory Visit: Payer: Medicare Other | Admitting: Cardiovascular Disease

## 2017-11-22 ENCOUNTER — Ambulatory Visit: Payer: Medicare Other | Admitting: Cardiovascular Disease

## 2017-11-22 ENCOUNTER — Encounter: Payer: Self-pay | Admitting: Cardiovascular Disease

## 2017-11-22 VITALS — BP 132/68 | HR 74 | Ht 65.0 in | Wt 139.2 lb

## 2017-11-22 DIAGNOSIS — Z7901 Long term (current) use of anticoagulants: Secondary | ICD-10-CM | POA: Diagnosis not present

## 2017-11-22 DIAGNOSIS — Z951 Presence of aortocoronary bypass graft: Secondary | ICD-10-CM | POA: Diagnosis not present

## 2017-11-22 DIAGNOSIS — I739 Peripheral vascular disease, unspecified: Secondary | ICD-10-CM

## 2017-11-22 DIAGNOSIS — I25119 Atherosclerotic heart disease of native coronary artery with unspecified angina pectoris: Secondary | ICD-10-CM | POA: Diagnosis not present

## 2017-11-22 DIAGNOSIS — I480 Paroxysmal atrial fibrillation: Secondary | ICD-10-CM

## 2017-11-22 DIAGNOSIS — I452 Bifascicular block: Secondary | ICD-10-CM

## 2017-11-22 MED ORDER — AMIODARONE HCL 200 MG PO TABS: 100.0000 mg | ORAL_TABLET | Freq: Every day | ORAL | 3 refills | Status: DC

## 2017-11-22 NOTE — Patient Instructions (Signed)
Medication Instructions:  INCREASE amiodarone to 100 mg daily  Follow-Up: 11/21 at 1:40 pm with Dr. Claiborne Billings  Any Other Special Instructions Will Be Listed Below (If Applicable).     If you need a refill on your cardiac medications before your next appointment, please call your pharmacy.

## 2017-11-22 NOTE — Progress Notes (Signed)
Patient ID: [name], male   DOB: October 16, 1945, 72 y.o.   MRN: [id number]    Primary MD:  [name]  HPI: [name] is a 72 y.o. male who is a former patient of [name].  He presents for a 4 month follow-up cardiology evaluation.  [name] has an extensive cardiac and peripheral vascular history. He underwent initial CABG revascularization surgery in [place] in 1984. In 1996 he required re-do CABG revascularization surgery after all grafts were [name] to be occluded. At that time he also underwent right carotid endarterectomy. In 1998 he underwent stenting of his left main coronary artery and also had stenting done to the vein graft supplying the marginal vessel. He has history of right renal artery stenosis and underwent initial stenting with predilatation 2005. He has documented chronic right bundle branch block. He also significant peripheral vascular disease with bilateral SFA occlusion. He has known left subclavian artery occlusion with right subclavian stenosis. Has a history of hypertension, hyperlipidemia, and remotely smoked cigarettes. His last catheterization was done in November 2012 which showed significant native CAD with evidence for patent left main stent. He had an occluded circumflex marginal vessel from the AV groove circumflex. There was 80% ostial RCA stenosis with 90% stenosis in the mid right coronary artery proximal to the RV marginal branch and total occlusion of the mid RCA. An occluded vein graft supplying the right coronary artery but evidence for collateralization to the distal right artery the left coronary injection and injection of the grafts. The vein supplying the marginal vessel with the previously placed ostial proximal Taxus stent had in-stent narrowing of 50% followed by 95% stenosis in the body of the proximal third of the vessel graft prior to the previously placed mid grafts 3.0x20 mm Taxus stent. At that time, he underwent difficult but  successful intervention involving long segmental stenosis of the vein graft supplying the circumflex marginal vessel. Additional tandem 3.0x38 mm and 3.0x8 mm Taxus stents were inserted with excellent angiographic result.   He saw [name] in August 2014 and due to concern for progressive carotid stenoses repeat Doppler studies were done and he also saw [name] [name] for followup evaluation. Repeat imaging showed a patent right carotid endarterectomy site the Doppler velocity suggestive of 40  percent stenosis of the bilateral proximal internal carotid arteries. There was left subclavian steal noted with retrograde left vertebral artery flow, monophasic left subclavian artery flow and significant difference in the bilateral brachial pressures.  He established cardiology care with me in December 2014.  A nuclear perfusion study in May 2015 demonstrated mild inferobasal ischemia, most likely secondary to RCA distribution.    On 03/25/2014 he was seen by [name] after he developed an episode of atrial fibrillation the day before.  An ECG in [name]  office showed atrial fibrillation with rate of 120.  His atenolol was increased to 50 mg twice a day, and he converted to sinus rhythm after 18 hours.  When he saw [name] the following day he was in sinus rhythm.  On the increased dose of beta blocker he denies recurrent palpitations.  He tells me he recently underwent a colonoscopy and had 3 polyps removed, but also required cauterization for another site that was oozing blood.  This reason, he was not restarted on anticoagulation but has been maintained on low-dose aspirin and Plavix.  He has seen [name] in follow-up of his PVD and is status post  left renal stenting.  He also is followed by [name] for his carotid disease.  In August 2017 he was seen by [name] after he was [name] to be in atrial fibrillation 1 week previously at cardiac rehabilitation.  He was started on amiodarone 200 mg  twice a day for 1 week and then 200 mg daily and his atenolol was reduced to 25 mg twice a day.  He was seen several weeks later on 10/29/2015 by [name] and was back in sinus rhythm.  He underwent carotid duplex imaging on 12/23/2015.  The right carotid endarterectomy site had Doppler velocities suggestive of a 60-79% stenosis. The left internal carotid artery velocity suggested a 40-59% stenoses.  Renal duplex imaging to just a patent right renal artery stent.  There was stable greater than 60% left renal artery stenosis.    When I  saw him in November 2017 he was maintaining sinus rhythm and  since he was on amiodarone, I recommended he reduce lovastatin to 20 mg and added Zetia for continued aggressive lipid management.  He had progressive lower extremity edema and recommended he increase his torsemide.  In March 2018 he developed episodes of bronchitis.  Since that time his rhythm has been irregular.  He wore a Holter monitor which showed atrial fibrillation with a minimum rate at 52 at 4:02 AM was sleeping and maximum rate at 119.  In the afternoon with an average rate of 82 bpm.  There are frequent PVCs, rare couplets, occasional episodes of bigeminy and trigeminy but no episodes of VT.  There were no pauses.  Previously, his blood pressure had become low and hehad been on losartan and I saw him one month ago there was leg swelling, left greater than right.  He had a chest x-ray with [name] on 06/21/2016.  A  follow-up carotid Doppler study in April 2018 showed improvement in his velocities such that his right carotid endarterectomy was patent with velocities now suggestive of 40-59% stenosis.  The left internal carotid velocities suggested a 1-39% stenosis.   When I saw him in May 2018, he was in atrial fibrillation.  I discontinued Plavix and started him on eliquis 5 mg twice a day.  I recommended that he not to take aspirin with his GI history.  I increased amiodarone to 200 mg twice a day  and reduced his Ranexa dose to 500 mg twice a day with concomitant therapy.  He was having lower extremity edema.  A Doppler study was negative for DVT by his primary physician.  I recommended support stockings.    I saw him 1 month later and he continued to be in atrial fibrillation.  At that time, we discussed potential DC cardioversion for restoration of sinus rhythm.  He underwent repeat echo on 08/24/2016 which showed an EF of 55-60%.  His left atrium was normal in size and his right atrium was mildly dilated.  PA pressure was increased at 48 mm.  There was moderate LVH. .  He apparently was seen subsequent to that evaluation by [name] and was having 2+ bilateral lower extremity edema.  Further discussion concerning cardioversion was undertaken but the patient initially deferred at that point and wanted to that it further.  He tells me 1 month ago he noticed some bright red blood per rectum.  As result, he self reduced his eliquis from 5 mg twice a day down to 5 mg daily.  Remotely, he has seen Dr. John [name] in   the past.  His last colonoscopy was at least 5 years ago.  He had stopped taking amiodarone since he felt to contribute to fatigue, tremors, dizziness, and gait staggering.  The symptomshave improved off amiodarone antiarrhythmic therapy.  Her, he has noticed that at times his pulse can increase up to the low 100s with minimal activity.  He has now been using compression stockings for his ankle edema which has improved.    When I last saw him in November 2018 , I recommended eliquis 5 mg twice a day for full anticoagulation.  I started him on metoprolol 50 mg twice a day  I discussed options concerning continuing permanent AF versus possible Tikosyn or AF ablation and referred him for  EP evaluation.  He saw Dr. Curt [name] in January 2019. Due to his QTC prolongation  he was not felt to be a candidate for Tikosyn or sotalol a and also was felt to be high risk for ablation.  As result he recommended  reiitiation of low-dose amiodarone.n 04/12/2017.  He underwent successful cardioversion.  When he saw [name] on 04/19/2017.  At that time, the patient had complaints of increasing dyspnea on exertion and orthopnea, which were similar to his previous symptoms on amiodarone.  When he last saw [name] a on May 02, 2017 he also complained of a fine tremor since institution of amiodarone, and amiodarone  was reduced to 100 mg every other day.   He underwent carotid duplex imaging which showed mild bilateral carotid plaque 40-59% on the right and 1-39% in the left.  In addition, an abdominal renal duplex study showed patent right renal artery stent, moderate left renal artery stenosis, and significant SMA stenosis.  This was reviewed by [name].  When I last saw him in March 2019 he believes his tremor and loss of coordination on the higher dose of amiodarone has improved with low-dose amiodarone.  He wasnot been taking Demadex for the past 10 days and this has resulted in increasing leg swelling.  Subsequently, he has been seen in the atrial fibrillation clinic by [name] several occasions, last seen Jul 14, 2017.  Prior to that evaluation he had seen his PCP for intermittent fevers.  When last seen in A. fib clinic he was in sinus rhythm. He denies any recurrent symptoms.  He continues to be on amiodarone 100 mg every other day and Eliquis for anticoagulation.  He is on metoprolol 75 mg twice a day, Ranexa 500 mg twice a day and his dose was reduced when amiodarone was instituted.  He is on lovastatin 40 mg for hyperlipidemia.  He has been taking torsemide on an as-needed basis.  He has a prescription for diltiazem 30 mg to take if his heart rate increases above 100 and long as blood pressure is greater than 100.    Since I last saw him, he has been unaware of any recurrent arrhythmia issues.  He has a history of anemia and is been evaluated by [facility] Spencer [name] at [facility].  He denies  any episodes of chest [name].  He continues to be on Eliquis at 5 mg twice a day without bleeding.  He is on lovastatin for hyperlipidemia.  He presents for evaluation.  Past Medical History:  Diagnosis Date  . Anemia   . Angina   . Atrial fibrillation ([place])   . Blood transfusion   . Bronchitis   . Bulging discs    "8 of them; thoracic, lumbar, sacral area"  .  Chronic back [name]   . Colon polyps   . Complication of anesthesia   . Coronary artery disease 01/24/11   Successful PCI long segmental stensois  vein graft to the CX marginal vessel-graft previously stented prox. 3.0x37m TAXUS stent mid seg 3.0x225mTAXUS stent now tandem stens of 3.0x3863m 3.0x8mm28maced with the seg. 50,80 & 99% stenosis reduced to 0%  . DDD (degenerative disc disease)   . Dysrhythmia    "PAC's and PVC's"  . Esophageal dilatation    "have had it done 7 times; last time ~ 2001"  . Esophageal stricture   . GERD (gastroesophageal reflux disease)   . Headache(784.0)   . Hiatal hernia   . Hyperlipidemia   . Myocardial infarction ([facility]CC)[place]91  [facility]   "had 3 heart attacks this year"  . Paroxysmal atrial fibrillation (HCC)   . Pyloric stenosis   . RBBB   . Renal artery stenosis (HCC)    hhistory of right renal artery stenting and re-intervention for "in-stent restenosis.  . S/P CABG (coronary artery bypass graft) [place]    "when I had the heart attacks"  . Subclavian artery stenosis, left (HCC)[place]. Ulcer    "small; Dr. PerrHenrene Pastornd it 02/2010"    Past Surgical History:  Procedure Laterality Date  . ADENOIDECTOMY     "when I was an infant"  . APPENDECTOMY  1953  . CARDIOVERSION N/A 04/17/2017   Procedure: CARDIOVERSION;  Surgeon: SkaiJerline [name];  [facility];  Service: Cardiovascular;  Laterality: N/A;  . CAROTID ENDARTERECTOMY Right [place]    "today makes a total of 9 stents"  . CORONARY ARTERY BYPASS GRAFT  1984   CABG X 4  . CORONARY ARTERY BYPASS GRAFT  1996   CABG X 4  . LEFT HEART CATHETERIZATION WITH CORONARY/GRAFT ANGIOGRAM N/A 01/24/2011   Procedure: LEFT HEART CATHETERIZATION WITH COROBeatrix Fettersurgeon: ThomTroy Sine;  [facility];  Service: Cardiovascular;  Laterality: N/A;  . pylondial cyst removal    . RETINAL DETACHMENT SURGERY  04/2007   right  . TONSILLECTOMY  1972    Allergies  Allergen Reactions  . Codeine Nausea And Vomiting  . Sulfa Antibiotics Rash  . Sulfonamide Derivatives Rash    Current Outpatient Medications  Medication Sig Dispense Refill  . amiodarone (PACERONE) 200 MG tablet Take 0.5 tablets (100 mg total) by mouth daily. 90 tablet 3  . apixaban (ELIQUIS) 5 MG TABS tablet Take 1 tablet (5 mg total) by mouth 2 (two) times daily. 180 tablet 3  . butalbital-aspirin-caffeine (FIORINAL) 50-325-40 MG per capsule Take 1 capsule by mouth every 4 (four) hours as needed for headache.     . diltiazem (CARDIZEM) 30 MG tablet Take one tablet by mouth every 4 hours AS NEEDED for A-fib HR > 100 as long as BP > 100. 30 tablet 1  . dutasteride (AVODART) 0.5 MG capsule Take 0.5 mg by mouth daily.     . esMarland Kitchenmeprazole (NEXIUM) 40 MG capsule Take 1 capsule (40 mg total) by mouth 2 (two) times daily. 180 capsule 3  . Ketotifen Fumarate (ALAWAY OP) Place 1 drop into the right eye daily.    . loMarland Kitchenatadine (CLARITIN) 10 MG tablet Take 10 mg by mouth daily as needed for allergies, rhinitis or itching.  3  . lovastatin (MEVACOR) 40 MG tablet Take 20 mg by mouth at bedtime.    . metoprolol tartrate (LOPRESSOR) 50 MG tablet Take 1.5 tablets (75 mg total) by mouth 2 (two) times daily. 270 tablet 3  . Multiple Vitamins-Minerals (MULTIVITAMINS THER. W/MINERALS) TABS Take 1 tablet by mouth daily.      . nitroGLYCERIN (NITROLINGUAL) 0.4 MG/SPRAY spray PLACE ONE SPRAY UNDER TONGUE EVERY 5 MINUITES FOR  UP TO 3 DOSES AS NEEDED FOR CHEST [name] 4.9 g 3  . ranolazine (RANEXA) 1000 MG SR tablet Take 500 mg by mouth 2 (two) times daily.    Marland Kitchen torsemide (DEMADEX) 20 MG tablet Take 20 mg by mouth as needed (for fluid). FOR SWELLING OR WEIGHT GAIN    . vitamin E 400 UNIT capsule Take 400 Units by mouth daily.      No current facility-administered medications for this visit.     Social History   Socioeconomic History  . Marital status: Married    Spouse name: Not on file  . Number of children: Not on file  . Years of education: Not on file  . Highest education level: Not on file  Occupational History  . Not on file  Social Needs  . Financial resource strain: Not on file  . Food insecurity:    Worry: Not on file    Inability: Not on file  . Transportation needs:    Medical: Not on file    Non-medical: Not on file  Tobacco Use  . Smoking status: Former Smoker    Packs/day: 1.00    Years: 38.00    Pack years: 38.00    Types: Cigarettes    Last attempt to quit: 02/09/2005    Years since quitting: 12.7  . Smokeless tobacco: Never Used  . Tobacco comment: "quit smoking cigarrettes 2006"  Substance and Sexual Activity  . Alcohol use: No    Alcohol/week: 0.0 standard drinks  . Drug use: No  . Sexual activity: Not on file  Lifestyle  . Physical activity:    Days per week: Not on file    Minutes per session: Not on file  . Stress: Not on file  Relationships  . Social connections:    Talks on phone: Not on file    Gets together: Not on file    Attends religious service: Not on file    Active member of club or organization: Not on file    Attends meetings of clubs or organizations: Not on file    Relationship status: Not on file  . Intimate partner violence:    Fear of current or ex partner: Not on file    Emotionally abused: Not on file    Physically abused: Not on file    Forced sexual activity: Not on file  Other Topics Concern  . Not on file  Social History Narrative  . Not  on file   Socially he is married has 2 children 2 stepchildren. He quit tobacco in 2000.  Family History  Problem Relation Age of Onset  . Breast cancer Paternal Aunt   . Diabetes Sister   . Ulcers Father   . Heart disease Father   . Heart failure Father   . Heart attack Father   . AAA (abdominal aortic aneurysm) Father   . Stroke Mother   . Hypertension Mother   . Colon cancer Neg Hx    ROS General: Negative; No fevers, chills, or night sweats;  HEENT: History of a  detached retina in his right eye; No changes in  hearing, sinus congestion, difficulty swallowing Pulmonary: Negative; No cough, wheezing, shortness of breath, hemoptysis Cardiovascular: see HPI;  GI: s/p polypectomy and rare red blood per rectum followed by [name]. GU: Occasional concentrated or  dark urine without blood Musculoskeletal: Negative; no myalgias, joint [name], or weakness Hematologic/Oncology: Positive for anemia Endocrine: Negative; no heat/cold intolerance; no diabetes Neuro: Negative; no changes in balance, headaches Skin: Negative; No rashes or skin lesions Psychiatric: Negative; No behavioral problems, depression Sleep: Negative; No snoring, daytime sleepiness, hypersomnolence, bruxism, restless legs, hypnogognic hallucinations, no cataplexy Other comprehensive 14 point system review is negative.   PE BP 132/68   Pulse 74   Ht _0  (1.651 m)   Wt 139 lb 3.2 oz (63.1 kg)   BMI 23.16 kg/m    Repeat blood pressure was 130/72  Wt Readings from Last 3 Encounters:  11/22/17 139 lb 3.2 oz (63.1 kg)  07/18/17 136 lb 6.4 oz (61.9 kg)  07/14/17 133 lb 3.2 oz (60.4 kg)   General: Alert, oriented, no distress.  Skin: normal turgor, no rashes, warm and dry HEENT: Normocephalic, atraumatic. Pupils equal round and reactive to light; sclera anicteric; extraocular muscles intact;  Nose without nasal septal hypertrophy Mouth/Parynx benign; Mallinpatti scale 3 Neck: No JVD, no carotid bruits;  normal carotid upstroke Lungs: clear to ausculatation and percussion; no wheezing or rales Chest wall: without tenderness to palpitation Heart: PMI not displaced, RRR, s1 s2 normal, 1/6 systolic murmur, no diastolic murmur, no rubs, gallops, thrills, or heaves Abdomen: soft, nontender; no hepatosplenomehaly, BS+; abdominal aorta nontender and not dilated by palpation. Back: no CVA tenderness Pulses 2+ Musculoskeletal: full range of motion, normal strength, no joint deformities Extremities: no clubbing cyanosis or edema, Homan's sign negative  Neurologic: grossly nonfocal; Cranial nerves grossly wnl Psychologic: Normal mood and affect   ECG (independently read by me): Atrial fibrillation at 76 bpm.  Right bundle branch block with repolarization changes.  Left posterior hemiblock.  Nonspecific ST-T changes  May 2019 ECG (independently read by me): Normal sinus rhythm at 63 bpm.  Right bundle branch block with repolarization changes.  Nonspecific ST changes.  QTc interval 493 ms.    March 2019 ECG (independently read by me): normal sinus rhythm at 64 bpm.  Right bundle branch block with repolarization changes.  Left posterior hemiblock.  QTc interval 497 ms.    November 2018 ECG (independently read by me): Atrial flutter with variable block.  PVC.  Left posterior hemiblock.  Right bundle branch block.  June 2018 ECG (independently read by me): Atrial fibrillation with ventricular rate at 70 bpm.  Right bundle-branch block with repolarization changes.  Probable left posterior fascicular block.  07/21/2016 ECG (independently read by me): atrial fibrillation with a controlled ventricular rate at 71 bpm.  Right bundle branch block and left posterior hemiblock.  Small inferior Q waves.  November 2017 ECG (independently read by me): Sinus rhythm with a PVC.  Right bundle branch block and probable left posterior hemiblock.  Inferior Q waves with preserved R waves.  February 2017 ECG (independently  read by me): normal sinus rhythm at 61 bpm. Probable left atrial enlargement. Right bundle branch block.  ECG (independently read by me): Sinus bradycardia 58 bpm.  Increased QTc interval 473 ms.  Right bundle-branch block.  May 2016 ECG (independently read by me): Sinus bradycardia 59 bpm with right bundle branch block.I ST-T changes.  QTc interval 475 ms.  PR interval 182  ms.  No ectopy.  February 2016 ECG (independently read by me) normal sinus rhythm at 63 bpm.  Right bundle-branch block.  Probable left posterior fascicular block.  PR interval 170 ms.  QTC interval 480 ms.  November 2015 ECG (independently read by me): Normal sinus rhythm at 62 bpm.  Right bundle branch block.  Small nondiagnostic inferior Q waves.  Prior May 2015ECG : sinus rhythm at 58 beats per minute.  Right bundle branch block.  Prior December 2014 ECG: Sinus rhythm with sinus bradycardia 55 beats per minute. Right bundle branch block.  LABS: BMP Latest Ref Rng & Units 06/30/2017 05/30/2017 05/02/2017  Glucose 65 - 99 mg/dL 126(H) 128(H) 97  BUN 6 - 20 mg/dL 28(H) 22 26  Creatinine 0.61 - 1.24 mg/dL 1.54(H) 1.33(H) 1.31(H)  BUN/Creat Ratio 10 - 24 - 17 20  Sodium 135 - 145 mmol/L 134(L) 143 141  Potassium 3.5 - 5.1 mmol/L 4.7 5.0 4.7  Chloride 101 - 111 mmol/L 101 107(H) 104  CO2 22 - 32 mmol/L 23 19(L) 23  Calcium 8.9 - 10.3 mg/dL 8.7(L) 8.8 8.9   Hepatic Function Latest Ref Rng & Units 06/30/2017 05/30/2017 05/02/2017  Total Protein 6.5 - 8.1 g/dL 6.5 6.1 6.2  Albumin 3.5 - 5.0 g/dL 3.2(L) 4.1 3.8  AST 15 - 41 U/L _0 ALT 17 - 63 U/L 16(L) 12 12  Alk Phosphatase 38 - 126 U/L 98 109 92  Total Bilirubin 0.3 - 1.2 mg/dL 0.6 0.2 0.2  Bilirubin, Direct 0.00 - 0.40 mg/dL - - 0.11   CBC Latest Ref Rng & Units 06/30/2017 05/30/2017 04/17/2017  WBC 4.0 - 10.5 K/uL 7.3 6.1 -  Hemoglobin 13.0 - 17.0 g/dL 11.2(L) 11.4(L) 11.6(L)  Hematocrit 39.0 - 52.0 % 33.0(L) 33.5(L) 34.0(L)  Platelets 150 - 400 K/uL 157 163 -     Lab Results  Component Value Date   MCV 91.2 06/30/2017   MCV 91 05/30/2017   MCV 97.0 11/25/2014   Lab Results  Component Value Date   TSH 0.269 (L) 06/30/2017    Lipid Panel     Component Value Date/Time   CHOL 172 08/01/2014 0852   TRIG 90 08/01/2014 0852   HDL 66 08/01/2014 0852   CHOLHDL 2.6 08/01/2014 0852   VLDL 18 08/01/2014 0852   LDLCALC 88 08/01/2014 0852     RADIOLOGY: No results [name].  IMPRESSION:  1. Paroxysmal A-fib ([place])   2. Chronic anticoagulation   3. Coronary artery disease involving native coronary artery of native heart with unspecified  angina pectoris ([place]) on anti-ischemic meds   4. Hx of CABG   5. RBBB (right bundle branch block with left anterior fascicular block)   6. PAD (peripheral artery disease) ([place])     ASSESSMENT AND PLAN: Mr. Nana Hoselton is a 72 year old gentleman who has an extensive CAD/PVD history, and is 35 years status post his initial CABG revascularization surgery done in [place] in 1984 and 23 years status post his second CABG surgery in 1996. He has had multiple peripheral vascular procedures including carotid endarterectomy, renal stenting, and has documented SFA occlusion. His last catheterization/ intervention in 2012 demonstrated diffuse disease in the vein graft supplying the marginal vessel and 2 additional Taxus stents inserted commencing ostially and extending to the proximal portion of the recently placed mid prior stent. He has a known occluded graft to the RCA with a mid RCA occlusion with left to right collaterals. He also has subclavian steal. He  does have mild hypokinesis inferolaterally in the basal inferior wall with an EF of 45% at that catheterization. He has chronic right bundle branch block.  Previously he had complained of mild chest anginal symptomatology typically when he starts exercise and particularly after eating. He had potential areas of myocardium which may be responsible for ischemia and I added   Ranexa 500 mg twice a day to his medical regimen and subsequently this was further titrated to 1000 mg twice a day , which resolved his symptomatology.  Because of issues with atrial fibrillation and a trial of amiodarone therapy in the past, Ranexa dose was reduced due to concomitant medication.  Since I last saw him, he had undergone EP evaluation for other options.  In the past he did not tolerate amiodarone due to dyspnea.  When amiodarone was restarted, he again started to notice shortness of breath in addition to some fine tremor.  He underwent successful cardioversion.  When I last saw him he was maintaining sinus rhythm but had experienced sensation of mild chest [name] and thought he may had have been in AF which resolved with nitroglycerin spray.  Over the last several months he denies any awareness of recurrent arrhythmia.  However, his ECG today confirms that he is back in atrial fibrillation.  He has been taking amiodarone only 100 mg every other day and I have suggested that he further titrate this back up to 100 mg daily.  He had tolerated this dose previously and it was only at the higher doses where he began to notice some tremors.  He continues to be on lovastatin for hyperlipidemia.  LDL cholesterol on Jul 17, 2017 was excellent at 3.  His blood pressure today is well controlled on his current regimen of metoprolol and torsemide.  He has not had recent bleeding on apixaban and is at 5 mg twice a day.  Most recent creatinine was 1.16 on Jul 17, 2017.    Troy Sine, MD, [facility]  11/24/2017 11:39 PM

## 2017-11-24 ENCOUNTER — Encounter: Payer: Self-pay | Admitting: Cardiovascular Disease

## 2017-12-16 ENCOUNTER — Other Ambulatory Visit: Payer: Self-pay | Admitting: Cardiovascular Disease

## 2017-12-18 ENCOUNTER — Other Ambulatory Visit: Payer: Self-pay | Admitting: Cardiovascular Disease

## 2017-12-18 ENCOUNTER — Ambulatory Visit (HOSPITAL_COMMUNITY)
Admission: RE | Admit: 2017-12-18 | Discharge: 2017-12-18 | Disposition: A | Discharge status: Home / Self Care | Payer: Medicare Other | Source: Ambulatory Visit | Attending: Cardiovascular Disease | Admitting: Cardiovascular Disease

## 2017-12-18 ENCOUNTER — Other Ambulatory Visit: Payer: Self-pay | Admitting: *Deleted

## 2017-12-18 DIAGNOSIS — I10 Essential (primary) hypertension: Secondary | ICD-10-CM

## 2017-12-18 DIAGNOSIS — I771 Stricture of artery: Secondary | ICD-10-CM | POA: Diagnosis present

## 2017-12-18 DIAGNOSIS — K551 Chronic vascular disorders of intestine: Secondary | ICD-10-CM

## 2017-12-18 DIAGNOSIS — Z9889 Other specified postprocedural states: Secondary | ICD-10-CM

## 2017-12-30 ENCOUNTER — Other Ambulatory Visit: Payer: Self-pay | Admitting: Cardiovascular Disease

## 2018-01-01 NOTE — Telephone Encounter (Signed)
Rx request sent to pharmacy.  

## 2018-01-04 ENCOUNTER — Other Ambulatory Visit: Payer: Self-pay | Admitting: Cardiovascular Disease

## 2018-01-04 NOTE — Telephone Encounter (Signed)
Rx has been sent to the pharmacy electronically. ° °

## 2018-01-25 ENCOUNTER — Encounter: Payer: Self-pay | Admitting: Cardiovascular Disease

## 2018-01-25 ENCOUNTER — Ambulatory Visit: Payer: Medicare Other | Admitting: Cardiovascular Disease

## 2018-01-25 VITALS — BP 124/70 | HR 77 | Ht 65.0 in | Wt 143.8 lb

## 2018-01-25 DIAGNOSIS — Z951 Presence of aortocoronary bypass graft: Secondary | ICD-10-CM | POA: Diagnosis not present

## 2018-01-25 DIAGNOSIS — I4819 Other persistent atrial fibrillation: Secondary | ICD-10-CM | POA: Diagnosis not present

## 2018-01-25 DIAGNOSIS — Z7901 Long term (current) use of anticoagulants: Secondary | ICD-10-CM

## 2018-01-25 DIAGNOSIS — I25119 Atherosclerotic heart disease of native coronary artery with unspecified angina pectoris: Secondary | ICD-10-CM

## 2018-01-25 DIAGNOSIS — I739 Peripheral vascular disease, unspecified: Secondary | ICD-10-CM

## 2018-01-25 DIAGNOSIS — I10 Essential (primary) hypertension: Secondary | ICD-10-CM

## 2018-01-25 DIAGNOSIS — I452 Bifascicular block: Secondary | ICD-10-CM | POA: Diagnosis not present

## 2018-01-25 MED ORDER — METOPROLOL TARTRATE 50 MG PO TABS: ORAL_TABLET | ORAL | 3 refills | Status: DC

## 2018-01-25 NOTE — Patient Instructions (Signed)
Medication Instructions:  INCREASE metoprolol tartrate (Lopressor) to 100 mg in the AM and 75 mg in the PM  If you need a refill on your cardiac medications before your next appointment, please call your pharmacy.   Follow-Up: At [facility], you and your health needs are our priority.  As part of our continuing mission to provide you with exceptional heart care, we have created designated Provider Care Teams.  These Care Teams include your primary Cardiologist (physician) and Advanced Practice Providers (APPs -  Physician Assistants and Nurse Practitioners) who all work together to provide you with the care you need, when you need it. You will need a follow up appointment in 4-5 months.  Please call our office 2 months in advance to schedule this appointment.  You may see Shelva Majestic, MD or one of the following Advanced Practice Providers on your designated Care Team: [place], [place] . Fabian Sharp, PA-C  **Dr. Claiborne Billings request we move your appointment with Dr. Curt Bears to December**

## 2018-01-25 NOTE — Progress Notes (Signed)
Patient ID: [name], male   DOB: 05-10-1945, 72 y.o.   MRN: [id number]    Primary MD:  Dr. Garwin Brothers  HPI: [name] is a 72 y.o. male who is a former patient of Dr. Rollene Fare.  He presents for a 2 month follow-up cardiology evaluation.  [name] has an extensive cardiac and peripheral vascular history. He underwent initial CABG revascularization surgery in [place] in 1984. In 1996 he required re-do CABG revascularization surgery after all grafts were found to be occluded. At that time he also underwent right carotid endarterectomy. In 1998 he underwent stenting of his left main coronary artery and also had stenting done to the vein graft supplying the marginal vessel. He has history of right renal artery stenosis and underwent initial stenting with predilatation 2005. He has documented chronic right bundle branch block. He also significant peripheral vascular disease with bilateral SFA occlusion. He has known left subclavian artery occlusion with right subclavian stenosis. Has a history of hypertension, hyperlipidemia, and remotely smoked cigarettes. His last catheterization was done in November 2012 which showed significant native CAD with evidence for patent left main stent. He had an occluded circumflex marginal vessel from the AV groove circumflex. There was 80% ostial RCA stenosis with 90% stenosis in the mid right coronary artery proximal to the RV marginal branch and total occlusion of the mid RCA. An occluded vein graft supplying the right coronary artery but evidence for collateralization to the distal right artery the left coronary injection and injection of the grafts. The vein supplying the marginal vessel with the previously placed ostial proximal Taxus stent had in-stent narrowing of 50% followed by 95% stenosis in the body of the proximal third of the vessel graft prior to the previously placed mid grafts 3.0x20 mm Taxus stent. At that time, he underwent difficult but  successful intervention involving long segmental stenosis of the vein graft supplying the circumflex marginal vessel. Additional tandem 3.0x38 mm and 3.0x8 mm Taxus stents were inserted with excellent angiographic result.   He saw Dr. Rollene Fare in August 2014 and due to concern for progressive carotid stenoses repeat Doppler studies were done and he also saw Dr. Sherren Mocha early for followup evaluation. Repeat imaging showed a patent right carotid endarterectomy site the Doppler velocity suggestive of 40  percent stenosis of the bilateral proximal internal carotid arteries. There was left subclavian steal noted with retrograde left vertebral artery flow, monophasic left subclavian artery flow and significant difference in the bilateral brachial pressures.  He established cardiology care with me in December 2014.  A nuclear perfusion study in May 2015 demonstrated mild inferobasal ischemia, most likely secondary to RCA distribution.    On 03/25/2014 he was seen by Dr. Martinique after he developed an episode of atrial fibrillation the day before.  An ECG in Dr. Latina Craver  office showed atrial fibrillation with rate of 120.  His atenolol was increased to 50 mg twice a day, and he converted to sinus rhythm after 18 hours.  When he saw Dr. Martinique the following day he was in sinus rhythm.  On the increased dose of beta blocker he denies recurrent palpitations.  He tells me he recently underwent a colonoscopy and had 3 polyps removed, but also required cauterization for another site that was oozing blood.  This reason, he was not restarted on anticoagulation but has been maintained on low-dose aspirin and Plavix.  He has seen Dr. Gwenlyn Found in follow-up of his PVD and is status post  left renal stenting.  He also is followed by Dr. Early for his carotid disease.  In August 2017 he was seen by [name] after he was found to be in atrial fibrillation 1 week previously at cardiac rehabilitation.  He was started on amiodarone 200 mg  twice a day for 1 week and then 200 mg daily and his atenolol was reduced to 25 mg twice a day.  He was seen several weeks later on 10/29/2015 by [name] and was back in sinus rhythm.  He underwent carotid duplex imaging on 12/23/2015.  The right carotid endarterectomy site had Doppler velocities suggestive of a 60-79% stenosis. The left internal carotid artery velocity suggested a 40-59% stenoses.  Renal duplex imaging to just a patent right renal artery stent.  There was stable greater than 60% left renal artery stenosis.    When I  saw him in November 2017 he was maintaining sinus rhythm and  since he was on amiodarone, I recommended he reduce lovastatin to 20 mg and added Zetia for continued aggressive lipid management.  He had progressive lower extremity edema and recommended he increase his torsemide.  In March 2018 he developed episodes of bronchitis.  Since that time his rhythm has been irregular.  He wore a Holter monitor which showed atrial fibrillation with a minimum rate at 52 at 4:02 AM was sleeping and maximum rate at 119.  In the afternoon with an average rate of 82 bpm.  There are frequent PVCs, rare couplets, occasional episodes of bigeminy and trigeminy but no episodes of VT.  There were no pauses.  Previously, his blood pressure had become low and hehad been on losartan and I saw him one month ago there was leg swelling, left greater than right.  He had a chest x-ray with Dr. Amin on 06/21/2016.  A  follow-up carotid Doppler study in April 2018 showed improvement in his velocities such that his right carotid endarterectomy was patent with velocities now suggestive of 40-59% stenosis.  The left internal carotid velocities suggested a 1-39% stenosis.   When I saw him in May 2018, he was in atrial fibrillation.  I discontinued Plavix and started him on eliquis 5 mg twice a day.  I recommended that he not to take aspirin with his GI history.  I increased amiodarone to 200 mg twice a day  and reduced his Ranexa dose to 500 mg twice a day with concomitant therapy.  He was having lower extremity edema.  A Doppler study was negative for DVT by his primary physician.  I recommended support stockings.    I saw him 1 month later and he continued to be in atrial fibrillation.  At that time, we discussed potential DC cardioversion for restoration of sinus rhythm.  He underwent repeat echo on 08/24/2016 which showed an EF of 55-60%.  His left atrium was normal in size and his right atrium was mildly dilated.  PA pressure was increased at 48 mm.  There was moderate LVH. .  He apparently was seen subsequent to that evaluation by Hao Meng and was having 2+ bilateral lower extremity edema.  Further discussion concerning cardioversion was undertaken but the patient initially deferred at that point and wanted to that it further.  He tells me 1 month ago he noticed some bright red blood per rectum.  As result, he self reduced his eliquis from 5 mg twice a day down to 5 mg daily.  Remotely, he has seen Dr. John Perry in   the past.  His last colonoscopy was at least 5 years ago.  He had stopped taking amiodarone since he felt to contribute to fatigue, tremors, dizziness, and gait staggering.  The symptomshave improved off amiodarone antiarrhythmic therapy.  Her, he has noticed that at times his pulse can increase up to the low 100s with minimal activity.  He has now been using compression stockings for his ankle edema which has improved.    When I  saw him in November 2018 , I recommended eliquis 5 mg twice a day for full anticoagulation.  I started him on metoprolol 50 mg twice a day  I discussed options concerning continuing permanent AF versus possible Tikosyn or AF ablation and referred him for  EP evaluation.  He saw Dr. Curt Bears in January 2019. Due to his QTC prolongation  he was not felt to be a candidate for Tikosyn or sotalol a and also was felt to be high risk for ablation.  As result he recommended  reiitiation of low-dose amiodarone.n 04/12/2017.  He underwent successful cardioversion.  When he saw Kerin Ransom on 04/19/2017.  At that time, the patient had complaints of increasing dyspnea on exertion and orthopnea, which were similar to his previous symptoms on amiodarone.  When he last saw Shanon Brow a on May 02, 2017 he also complained of a fine tremor since institution of amiodarone, and amiodarone  was reduced to 100 mg every other day.   He underwent carotid duplex imaging which showed mild bilateral carotid plaque 40-59% on the right and 1-39% in the left.  In addition, an abdominal renal duplex study showed patent right renal artery stent, moderate left renal artery stenosis, and significant SMA stenosis.  This was reviewed by Dr. Gwenlyn Found.  When seen in March 2019 he felt his tremor and loss of coordination on the higher dose of amiodarone  improved with the lower dose amiodarone.  He was not been taking Demadex for the past 10 days and this has resulted in increasing leg swelling.  Subsequently, he was seen in the atrial fibrillation clinic by Orson Eva several occasions, last seen Jul 14, 2017.  Prior to that evaluation he had seen his PCP for intermittent fevers.  When last seen in A. fib clinic he was in sinus rhythm. He denies any recurrent symptoms.  He continues to be on amiodarone 100 mg every other day and Eliquis for anticoagulation.  He is on metoprolol 75 mg twice a day, Ranexa 500 mg twice a day and his dose was reduced when amiodarone was instituted.  He is on lovastatin 40 mg for hyperlipidemia.  He has been taking torsemide on an as-needed basis.  He has a prescription for diltiazem 30 mg to take if his heart rate increases above 100 and long as blood pressure is greater than 100.    When I last saw him in September 2018 he was back in atrial fibrillation with ventricular rate at 76 bpm.  His right bundle branch block was stable and there was evidence for left posterior  hemiblock.  Had only been taking amiodarone 100 mg every other day and I suggested that he further titrate his back to 100 mg daily.  With this slightly higher dose he has noticed some slight increase in some balance and coordination issues.  He denies any anginal symptoms.  He has noted some leg swelling bilaterally.  He presents for evaluation.  Past Medical History:  Diagnosis Date  . Anemia   . Angina   .  Atrial fibrillation (HCC)   . Blood transfusion   . Bronchitis   . Bulging discs    "8 of them; thoracic, lumbar, sacral area"  . Chronic back pain   . Colon polyps   . Complication of anesthesia   . Coronary artery disease 01/24/11   Successful PCI long segmental stensois  vein graft to the CX marginal vessel-graft previously stented prox. 3.0x12mm TAXUS stent mid seg 3.0x20mm TAXUS stent now tandem stens of 3.0x38mm & 3.0x8mm placed with the seg. 50,80 & 99% stenosis reduced to 0%  . DDD (degenerative disc disease)   . Dysrhythmia    "PAC's and PVC's"  . Esophageal dilatation    "have had it done 7 times; last time ~ 2001"  . Esophageal stricture   . GERD (gastroesophageal reflux disease)   . Headache(784.0)   . Hiatal hernia   . Hyperlipidemia   . Myocardial infarction (HCC) 1991  . Myocardial infarction (HCC) 2005   "had 3 heart attacks this year"  . Paroxysmal atrial fibrillation (HCC)   . Pyloric stenosis   . RBBB   . Renal artery stenosis (HCC)    hhistory of right renal artery stenting and re-intervention for "in-stent restenosis.  . S/P CABG (coronary artery bypass graft) 1984 & 1996   [place] 1996 DR. WILSON  . Shortness of breath    "when I had the heart attacks"  . Subclavian artery stenosis, left (HCC)   . Ulcer    "small; Dr. Perry found it 02/2010"    Past Surgical History:  Procedure Laterality Date  . ADENOIDECTOMY     "when I was an infant"  . APPENDECTOMY  1953  . CARDIOVERSION N/A 04/17/2017   Procedure: CARDIOVERSION;  Surgeon:  Skains, Mark C, MD;  Location: [facility];  Service: Cardiovascular;  Laterality: N/A;  . CAROTID ENDARTERECTOMY Right 1996   CE  . CORONARY ANGIOPLASTY WITH STENT PLACEMENT  01/24/11   "today makes a total of 9 stents"  . CORONARY ARTERY BYPASS GRAFT  1984   CABG X 4  . CORONARY ARTERY BYPASS GRAFT  1996   CABG X 4  . LEFT HEART CATHETERIZATION WITH CORONARY/GRAFT ANGIOGRAM N/A 01/24/2011   Procedure: LEFT HEART CATHETERIZATION WITH CORONARY/GRAFT ANGIOGRAM;  Surgeon: Thomas A Kelly, MD;  Location: [facility] CATH LAB;  Service: Cardiovascular;  Laterality: N/A;  . pylondial cyst removal    . RETINAL DETACHMENT SURGERY  04/2007   right  . TONSILLECTOMY  1972    Allergies  Allergen Reactions  . Codeine Nausea And Vomiting  . Sulfa Antibiotics Rash  . Sulfonamide Derivatives Rash    Current Outpatient Medications  Medication Sig Dispense Refill  . amiodarone (PACERONE) 200 MG tablet Take 0.5 tablets (100 mg total) by mouth daily. 90 tablet 3  . apixaban (ELIQUIS) 5 MG TABS tablet Take 1 tablet (5 mg total) by mouth 2 (two) times daily. 180 tablet 3  . butalbital-aspirin-caffeine (FIORINAL) 50-325-40 MG per capsule Take 1 capsule by mouth every 4 (four) hours as needed for headache.     . diltiazem (CARDIZEM) 30 MG tablet Take one tablet by mouth every 4 hours AS NEEDED for A-fib HR > 100 as long as BP > 100. 30 tablet 1  . dutasteride (AVODART) 0.5 MG capsule Take 0.5 mg by mouth daily.     . esomeprazole (NEXIUM) 40 MG capsule Take 1 capsule (40 mg total) by mouth 2 (two) times daily. 180 capsule 3  . Ketotifen Fumarate (ALAWAY OP) Place   1 drop into the right eye daily.    . loratadine (CLARITIN) 10 MG tablet Take 10 mg by mouth daily as needed for allergies, rhinitis or itching.   3  . lovastatin (MEVACOR) 40 MG tablet Take 20 mg by mouth at bedtime.    . lovastatin (MEVACOR) 40 MG tablet TAKE 20 MG BY MOUTH AT BEDTIME 45 tablet 3  . metoprolol tartrate (LOPRESSOR) 50 MG tablet Take 2  tablets (100 mg total) by mouth every morning AND 1.5 tablets (75 mg total) every evening. 315 tablet 3  . Multiple Vitamins-Minerals (MULTIVITAMINS THER. W/MINERALS) TABS Take 1 tablet by mouth daily.      . nitroGLYCERIN (NITROLINGUAL) 0.4 MG/SPRAY spray Place 1 spray under the tongue every 5 (five) minutes x 3 doses as needed for chest pain. 4.9 g 2  . ranolazine (RANEXA) 1000 MG SR tablet Take 500 mg by mouth 2 (two) times daily.    . ranolazine (RANEXA) 1000 MG SR tablet TAKE 1 TABLET BY MOUTH TWICE DAILY 180 tablet 3  . torsemide (DEMADEX) 20 MG tablet Take 20 mg by mouth as needed (for fluid). FOR SWELLING OR WEIGHT GAIN    . vitamin E 400 UNIT capsule Take 400 Units by mouth daily.      No current facility-administered medications for this visit.     Social History   Socioeconomic History  . Marital status: Married    Spouse name: Not on file  . Number of children: Not on file  . Years of education: Not on file  . Highest education level: Not on file  Occupational History  . Not on file  Social Needs  . Financial resource strain: Not on file  . Food insecurity:    Worry: Not on file    Inability: Not on file  . Transportation needs:    Medical: Not on file    Non-medical: Not on file  Tobacco Use  . Smoking status: Former Smoker    Packs/day: 1.00    Years: 38.00    Pack years: 38.00    Types: Cigarettes    Last attempt to quit: 02/09/2005    Years since quitting: 12.9  . Smokeless tobacco: Never Used  . Tobacco comment: "quit smoking cigarrettes 2006"  Substance and Sexual Activity  . Alcohol use: No    Alcohol/week: 0.0 standard drinks  . Drug use: No  . Sexual activity: Not on file  Lifestyle  . Physical activity:    Days per week: Not on file    Minutes per session: Not on file  . Stress: Not on file  Relationships  . Social connections:    Talks on phone: Not on file    Gets together: Not on file    Attends religious service: Not on file    Active  member of club or organization: Not on file    Attends meetings of clubs or organizations: Not on file    Relationship status: Not on file  . Intimate partner violence:    Fear of current or ex partner: Not on file    Emotionally abused: Not on file    Physically abused: Not on file    Forced sexual activity: Not on file  Other Topics Concern  . Not on file  Social History Narrative  . Not on file   Socially he is married has 2 children 2 stepchildren. He quit tobacco in 2000.  Family History  Problem Relation Age of Onset  . Breast cancer   Paternal Aunt   . Diabetes Sister   . Ulcers Father   . Heart disease Father   . Heart failure Father   . Heart attack Father   . AAA (abdominal aortic aneurysm) Father   . Stroke Mother   . Hypertension Mother   . 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 Perry. GU: Occasional concentrated or  dark urine without blood Musculoskeletal: Negative; no myalgias, joint pain, or weakness Hematologic/Oncology: Positive for anemia Endocrine: Negative; no heat/cold intolerance; no diabetes Neuro: Negative; no changes in balance, headaches Skin: Negative; No rashes or skin lesions Psychiatric: Negative; No behavioral problems, depression Sleep: Negative; No snoring, daytime sleepiness, hypersomnolence, bruxism, restless legs, hypnogognic hallucinations, no cataplexy Other comprehensive 14 point system review is negative.   PE BP 124/70   Pulse 77   Ht 5' 5" (1.651 m)   Wt 143 lb 12.8 oz (65.2 kg)   BMI 23.93 kg/m    Repeat blood pressure by me 120/68  Wt Readings from Last 3 Encounters:  01/25/18 143 lb 12.8 oz (65.2  kg)  11/22/17 139 lb 3.2 oz (63.1 kg)  07/18/17 136 lb 6.4 oz (61.9 kg)   General: Alert, oriented, no distress.  Skin: normal turgor, no rashes, warm and dry HEENT: Normocephalic, atraumatic. Pupils equal round and reactive to light; sclera anicteric; extraocular muscles intact;  Nose without nasal septal hypertrophy Mouth/Parynx benign; Mallinpatti scale 3 Neck: No JVD, no carotid bruits; normal carotid upstroke Lungs: clear to ausculatation and percussion; no wheezing or rales Chest wall: without tenderness to palpitation Heart: PMI not displaced, irregularly irregular with a controlled ventricular rate, s1 s2 normal, 1/6 systolic murmur, no diastolic murmur, no rubs, gallops, thrills, or heaves Abdomen: soft, nontender; no hepatosplenomehaly, BS+; abdominal aorta nontender and not dilated by palpation. Back: no CVA tenderness Pulses 2+ Musculoskeletal: full range of motion, normal strength, no joint deformities Extremities: Plus pedal edema bilaterally; no clubbing cyanosis, Homan's sign negative  Neurologic: grossly nonfocal; Cranial nerves grossly wnl Psychologic: Normal mood and affect   ECG (independently read by me): Atrial fibrillation at 77 bpm.  Right bundle branch block with repolarization changes.  QTc interval 506 ms.  November 22, 2017 ECG (independently read by me): Atrial fibrillation at 76 bpm.  Right bundle branch block with repolarization changes.  Left posterior hemiblock.  Nonspecific ST-T changes  May 2019 ECG (independently read by me): Normal sinus rhythm at 63 bpm.  Right bundle branch block with repolarization changes.  Nonspecific ST changes.  QTc interval 493 ms.    March 2019 ECG (independently read by me): normal sinus rhythm at 64 bpm.  Right bundle branch block with repolarization changes.  Left posterior hemiblock.  QTc interval 497 ms.    November 2018 ECG (independently read by me): Atrial flutter with variable block.  PVC.  Left posterior  hemiblock.  Right bundle branch block.  June 2018 ECG (independently read by me): Atrial fibrillation with ventricular rate at 70 bpm.  Right bundle-branch block with repolarization changes.  Probable left posterior fascicular block.  07/21/2016 ECG (independently read by me): atrial fibrillation with a controlled ventricular rate at 71 bpm.  Right bundle branch block and left   posterior hemiblock.  Small inferior Q waves.  November 2017 ECG (independently read by me): Sinus rhythm with a PVC.  Right bundle branch block and probable left posterior hemiblock.  Inferior Q waves with preserved R waves.  February 2017 ECG (independently read by me): normal sinus rhythm at 61 bpm. Probable left atrial enlargement. Right bundle branch block.  ECG (independently read by me): Sinus bradycardia 58 bpm.  Increased QTc interval 473 ms.  Right bundle-branch block.  May 2016 ECG (independently read by me): Sinus bradycardia 59 bpm with right bundle branch block.I ST-T changes.  QTc interval 475 ms.  PR interval 182 ms.  No ectopy.  February 2016 ECG (independently read by me) normal sinus rhythm at 63 bpm.  Right bundle-branch block.  Probable left posterior fascicular block.  PR interval 170 ms.  QTC interval 480 ms.  November 2015 ECG (independently read by me): Normal sinus rhythm at 62 bpm.  Right bundle branch block.  Small nondiagnostic inferior Q waves.  Prior May 2015ECG : sinus rhythm at 58 beats per minute.  Right bundle branch block.  Prior December 2014 ECG: Sinus rhythm with sinus bradycardia 55 beats per minute. Right bundle branch block.  LABS: BMP Latest Ref Rng & Units 06/30/2017 05/30/2017 05/02/2017  Glucose 65 - 99 mg/dL 126(H) 128(H) 97  BUN 6 - 20 mg/dL 28(H) 22 26  Creatinine 0.61 - 1.24 mg/dL 1.54(H) 1.33(H) 1.31(H)  BUN/Creat Ratio 10 - 24 - 17 20  Sodium 135 - 145 mmol/L 134(L) 143 141  Potassium 3.5 - 5.1 mmol/L 4.7 5.0 4.7  Chloride 101 - 111 mmol/L 101 107(H) 104  CO2 22 -  32 mmol/L 23 19(L) 23  Calcium 8.9 - 10.3 mg/dL 8.7(L) 8.8 8.9   Hepatic Function Latest Ref Rng & Units 06/30/2017 05/30/2017 05/02/2017  Total Protein 6.5 - 8.1 g/dL 6.5 6.1 6.2  Albumin 3.5 - 5.0 g/dL 3.2(L) 4.1 3.8  AST 15 - 41 U/L 22 19 17  ALT 17 - 63 U/L 16(L) 12 12  Alk Phosphatase 38 - 126 U/L 98 109 92  Total Bilirubin 0.3 - 1.2 mg/dL 0.6 0.2 0.2  Bilirubin, Direct 0.00 - 0.40 mg/dL - - 0.11   CBC Latest Ref Rng & Units 06/30/2017 05/30/2017 04/17/2017  WBC 4.0 - 10.5 K/uL 7.3 6.1 -  Hemoglobin 13.0 - 17.0 g/dL 11.2(L) 11.4(L) 11.6(L)  Hematocrit 39.0 - 52.0 % 33.0(L) 33.5(L) 34.0(L)  Platelets 150 - 400 K/uL 157 163 -   Lab Results  Component Value Date   MCV 91.2 06/30/2017   MCV 91 05/30/2017   MCV 97.0 11/25/2014   Lab Results  Component Value Date   TSH 0.269 (L) 06/30/2017    Lipid Panel     Component Value Date/Time   CHOL 172 08/01/2014 0852   TRIG 90 08/01/2014 0852   HDL 66 08/01/2014 0852   CHOLHDL 2.6 08/01/2014 0852   VLDL 18 08/01/2014 0852   LDLCALC 88 08/01/2014 0852     RADIOLOGY: No results found.  IMPRESSION:  1. Persistent atrial fibrillation   2. RBBB (right bundle branch block with left anterior fascicular block)   3. Coronary artery disease involving native coronary artery of native heart with unspecified  angina pectoris (HCC) on anti-ischemic meds   4. Hx of CABG   5. PAD (peripheral artery disease) (HCC)   6. Essential hypertension   7. Chronic anticoagulation     ASSESSMENT AND PLAN: Mr. Lyndel Boyajian is a 71-year-old gentleman who has   an extensive CAD/PVD history, and is 35 years status post his initial CABG revascularization surgery done in [place] in 1984 and 23 years status post his second CABG surgery in 1996. He has had multiple peripheral vascular procedures including carotid endarterectomy, renal stenting, and has documented SFA occlusion. His last catheterization/ intervention in 2012 demonstrated diffuse disease in the  vein graft supplying the marginal vessel and 2 additional Taxus stents inserted commencing ostially and extending to the proximal portion of the recently placed mid prior stent. He has a known occluded graft to the RCA with a mid RCA occlusion with left to right collaterals. He also has subclavian steal. He does have mild hypokinesis inferolaterally in the basal inferior wall with an EF of 45% at that catheterization. He has chronic right bundle branch block.  Previously he had complained of mild chest anginal symptomatology typically when he starts exercise and particularly after eating. He had potential areas of myocardium which may be responsible for ischemia and I added  Ranexa 500 mg twice a day to his medical regimen and subsequently this was further titrated to 1000 mg twice a day , which resolved his symptomatology.  Because of issues with atrial fibrillation and a trial of amiodarone therapy in the past, Ranexa dose was reduced due to concomitant medication.  He has undergone EP evaluation for other options.  In the past he did not tolerate higher dose amiodarone which resulted in unsteadiness and balance issues.  When last seen in September 2019 he developed recurrent AF when only taken amiodarone 100 mg every other day.  For the past several months he has been back on 100 mg daily but continues to be in atrial fibrillation.  He will be having a follow-up appointment to see Dr. Camnitz and I have suggested perhaps at this appointment be moved up sooner rather than as scheduled sometime in January.  I am further titrating metoprolol to 100 mg in the morning and 75 mg at night from his present dose of 75 mg twice a day.  He continues to be on Eliquis for anticoagulation and denies bleeding.  He is not having any anginal symptoms and continues to be on Ranexa 500 mg twice a day.  He has only been taking torsemide as needed for swelling and on exam today there is 1+ edema.  Support stockings were recommended.   He will undergo follow-up EP evaluation and I will see him back in 4 to 5 months for reassessment.    Thomas A. Kelly, MD, FACC  01/27/2018 12:15 PM    

## 2018-01-27 ENCOUNTER — Encounter: Payer: Self-pay | Admitting: Cardiovascular Disease

## 2018-01-30 ENCOUNTER — Encounter (HOSPITAL_COMMUNITY): Payer: Self-pay | Admitting: Nurse Practitioner

## 2018-01-30 ENCOUNTER — Ambulatory Visit (HOSPITAL_COMMUNITY)
Admission: RE | Admit: 2018-01-30 | Discharge: 2018-01-30 | Disposition: A | Discharge status: Home / Self Care | Payer: Medicare Other | Source: Ambulatory Visit | Attending: Nurse Practitioner | Admitting: Nurse Practitioner

## 2018-01-30 VITALS — BP 120/64 | HR 84 | Ht 65.0 in | Wt 142.0 lb

## 2018-01-30 DIAGNOSIS — Z79899 Other long term (current) drug therapy: Secondary | ICD-10-CM | POA: Diagnosis not present

## 2018-01-30 DIAGNOSIS — Z885 Allergy status to narcotic agent status: Secondary | ICD-10-CM | POA: Insufficient documentation

## 2018-01-30 DIAGNOSIS — Z7901 Long term (current) use of anticoagulants: Secondary | ICD-10-CM | POA: Insufficient documentation

## 2018-01-30 DIAGNOSIS — E785 Hyperlipidemia, unspecified: Secondary | ICD-10-CM | POA: Insufficient documentation

## 2018-01-30 DIAGNOSIS — I110 Hypertensive heart disease with heart failure: Secondary | ICD-10-CM | POA: Diagnosis not present

## 2018-01-30 DIAGNOSIS — Z833 Family history of diabetes mellitus: Secondary | ICD-10-CM | POA: Insufficient documentation

## 2018-01-30 DIAGNOSIS — Z87891 Personal history of nicotine dependence: Secondary | ICD-10-CM | POA: Insufficient documentation

## 2018-01-30 DIAGNOSIS — Z882 Allergy status to sulfonamides status: Secondary | ICD-10-CM | POA: Diagnosis not present

## 2018-01-30 DIAGNOSIS — I4819 Other persistent atrial fibrillation: Secondary | ICD-10-CM

## 2018-01-30 DIAGNOSIS — Z955 Presence of coronary angioplasty implant and graft: Secondary | ICD-10-CM | POA: Insufficient documentation

## 2018-01-30 DIAGNOSIS — Z951 Presence of aortocoronary bypass graft: Secondary | ICD-10-CM | POA: Diagnosis not present

## 2018-01-30 DIAGNOSIS — I5032 Chronic diastolic (congestive) heart failure: Secondary | ICD-10-CM | POA: Insufficient documentation

## 2018-01-30 DIAGNOSIS — K219 Gastro-esophageal reflux disease without esophagitis: Secondary | ICD-10-CM | POA: Diagnosis not present

## 2018-01-30 DIAGNOSIS — I4510 Unspecified right bundle-branch block: Secondary | ICD-10-CM | POA: Diagnosis not present

## 2018-01-30 DIAGNOSIS — I252 Old myocardial infarction: Secondary | ICD-10-CM | POA: Diagnosis not present

## 2018-01-30 DIAGNOSIS — I2510 Atherosclerotic heart disease of native coronary artery without angina pectoris: Secondary | ICD-10-CM | POA: Insufficient documentation

## 2018-01-30 DIAGNOSIS — Z823 Family history of stroke: Secondary | ICD-10-CM | POA: Diagnosis not present

## 2018-01-30 DIAGNOSIS — Z8249 Family history of ischemic heart disease and other diseases of the circulatory system: Secondary | ICD-10-CM | POA: Insufficient documentation

## 2018-01-30 MED ORDER — AMIODARONE HCL 200 MG PO TABS: 100.0000 mg | ORAL_TABLET | ORAL | 3 refills | Status: DC

## 2018-01-30 NOTE — Progress Notes (Signed)
Primary Care Physician: Garwin Brothers, MD Referring Physician: Venida Jarvis Price/ Dr. Sherlyn Lick is a 72 y.o. male with a h/o severe CAD, underwent CABG in [place] in 1984. In 1996 he required redo CABG, most recently cath in 2012 with PCI to SVG. He also has severe PVD with a a carotid endarterectomy at the time of hs re-do CABG. In 1998 he underwent stenting of the left main. He has renal artery stenosis and underwent stenting in predilation in 2005, also known bilateral SFA occlusions. He has a subclavian artery occlusion with right subclavian stenosis.  HTN, HLD, number of esophageal dilations, long hx of AFib with DCCV in the past, RBBB, chronic diastolic CHF. He saw Dr. Curt Bears in Jan 2019 for persistent afib, was started on amiodarone and had successful cardioversion. He could not tolerate higher doses of amiodarone and is currently taking 100 mg qod.  He recently saw Dr. Claiborne Billings and was in afib. He increased amiodarone back to 100 mg daily and increased BB. He is in afib , rate controlled.Marland Kitchen He thinks he  feels worse,  on higher dose of amiodarone,as he feels in  Afib. He is scheduled to see Dr. Curt Bears in early January. Last January when he saw Dr Curt Bears, he was not a candidate for qtc prolonging drugs for  long qt interval  at baseline. Dr. Curt Bears  was not overly enthusiastic about an ablation for pt with his complicated cardiovalvular disease.    Today, he denies symptoms of palpitations,  shortness of breath, orthopnea, PND, lower extremity edema, dizziness, presyncope, syncope, or neurologic sequela. Intermittent fever, + for intermittent chest pain, shortness of breath with exertion.The patient is tolerating medications without difficulties and is otherwise without complaint today.   Past Medical History:  Diagnosis Date  . Anemia   . Angina   . Atrial fibrillation ([place])   . Blood transfusion   . Bronchitis   . Bulging discs    "8 of them; thoracic, lumbar, sacral  area"  . Chronic back pain   . Colon polyps   . Complication of anesthesia   . Coronary artery disease 01/24/11   Successful PCI long segmental stensois  vein graft to the CX marginal vessel-graft previously stented prox. 3.0x70mm TAXUS stent mid seg 3.0x88mm TAXUS stent now tandem stens of 3.0x18mm & 3.0x31mm placed with the seg. 50,80 & 99% stenosis reduced to 0%  . DDD (degenerative disc disease)   . Dysrhythmia    "PAC's and PVC's"  . Esophageal dilatation    "have had it done 7 times; last time ~ 2001"  . Esophageal stricture   . GERD (gastroesophageal reflux disease)   . Headache(784.0)   . Hiatal hernia   . Hyperlipidemia   . Myocardial infarction ([place]) 1991  . Myocardial infarction [facility]) 2005   "had 3 heart attacks this year"  . Paroxysmal atrial fibrillation (HCC)   . Pyloric stenosis   . RBBB   . Renal artery stenosis (HCC)    hhistory of right renal artery stenting and re-intervention for "in-stent restenosis.  . S/P CABG (coronary artery bypass graft) [place]  . Shortness of breath    "when I had the heart attacks"  . Subclavian artery stenosis, left ([place])   . Ulcer    "small; Dr. Henrene Pastor found it 02/2010"   Past Surgical History:  Procedure Laterality Date  . ADENOIDECTOMY     "when I was an  infant"  . APPENDECTOMY  1953  . CARDIOVERSION N/A 04/17/2017   Procedure: CARDIOVERSION;  Surgeon: Jerline Pain, MD;  [facility] ENDOSCOPY;  Service: Cardiovascular;  Laterality: N/A;  . CAROTID ENDARTERECTOMY Right [place]  01/24/11   "today makes a total of 9 stents"  . CORONARY ARTERY BYPASS GRAFT  1984   CABG X 4  . CORONARY ARTERY BYPASS GRAFT  1996   CABG X 4  . LEFT HEART CATHETERIZATION WITH CORONARY/GRAFT ANGIOGRAM N/A 01/24/2011   Procedure: LEFT HEART CATHETERIZATION WITH Beatrix Fetters;  Surgeon: Troy Sine, MD;  Location[facility] C[facility] LAB;  Service:  Cardiovascular;  Laterality: N/A;  . pylondial cyst removal    . RETINAL DETACHMENT SURGERY  04/2007   right  . TONSILLECTOMY  1972    Current Outpatient Medications  Medication Sig Dispense Refill  . amiodarone (PACERONE) 200 MG tablet Take 0.5 tablets (100 mg total) by mouth every other day. 90 tablet 3  . apixaban (ELIQUIS) 5 MG TABS tablet Take 1 tablet (5 mg total) by mouth 2 (two) times daily. 180 tablet 3  . butalbital-aspirin-caffeine (FIORINAL) 50-325-40 MG per capsule Take 1 capsule by mouth every 4 (four) hours as needed for headache.     . diltiazem (CARDIZEM) 30 MG tablet Take one tablet by mouth every 4 hours AS NEEDED for A-fib HR > 100 as long as BP > 100. 30 tablet 1  . dutasteride (AVODART) 0.5 MG capsule Take 0.5 mg by mouth daily.     Marland Kitchen esomeprazole (NEXIUM) 40 MG capsule Take 1 capsule (40 mg total) by mouth 2 (two) times daily. 180 capsule 3  . Ketotifen Fumarate (ALAWAY OP) Place 1 drop into the right eye daily.    Marland Kitchen loratadine (CLARITIN) 10 MG tablet Take 10 mg by mouth daily as needed for allergies, rhinitis or itching.   3  . lovastatin (MEVACOR) 40 MG tablet Take 20 mg by mouth at bedtime.    . metoprolol tartrate (LOPRESSOR) 50 MG tablet Take 2 tablets (100 mg total) by mouth every morning AND 1.5 tablets (75 mg total) every evening. 315 tablet 3  . Multiple Vitamins-Minerals (MULTIVITAMINS THER. W/MINERALS) TABS Take 1 tablet by mouth daily.      . nitroGLYCERIN (NITROLINGUAL) 0.4 MG/SPRAY spray Place 1 spray under the tongue every 5 (five) minutes x 3 doses as needed for chest pain. 4.9 g 2  . ranolazine (RANEXA) 1000 MG SR tablet Take 500 mg by mouth 2 (two) times daily.    Marland Kitchen torsemide (DEMADEX) 20 MG tablet Take 20 mg by mouth as needed (for fluid). FOR SWELLING OR WEIGHT GAIN    . vitamin E 400 UNIT capsule Take 400 Units by mouth daily.      No current facility-administered medications for this encounter.     Allergies  Allergen Reactions  . Codeine  Nausea And Vomiting  . Sulfa Antibiotics Rash  . Sulfonamide Derivatives Rash    Social History   Socioeconomic History  . Marital status: Married    Spouse name: Not on file  . Number of children: Not on file  . Years of education: Not on file  . Highest education level: Not on file  Occupational History  . Not on file  Social Needs  . Financial resource strain: Not on file  . Food insecurity:    Worry: Not on file    Inability: Not on file  . Transportation needs:  Medical: Not on file    Non-medical: Not on file  Tobacco Use  . Smoking status: Former Smoker    Packs/day: 1.00    Years: 38.00    Pack years: 38.00    Types: Cigarettes    Last attempt to quit: 02/09/2005    Years since quitting: 12.9  . Smokeless tobacco: Never Used  . Tobacco comment: "quit smoking cigarrettes 2006"  Substance and Sexual Activity  . Alcohol use: No    Alcohol/week: 0.0 standard drinks  . Drug use: No  . Sexual activity: Not on file  Lifestyle  . Physical activity:    Days per week: Not on file    Minutes per session: Not on file  . Stress: Not on file  Relationships  . Social connections:    Talks on phone: Not on file    Gets together: Not on file    Attends religious service: Not on file    Active member of club or organization: Not on file    Attends meetings of clubs or organizations: Not on file    Relationship status: Not on file  . Intimate partner violence:    Fear of current or ex partner: Not on file    Emotionally abused: Not on file    Physically abused: Not on file    Forced sexual activity: Not on file  Other Topics Concern  . Not on file  Social History Narrative  . Not on file    Family History  Problem Relation Age of Onset  . Breast cancer Paternal Aunt   . Diabetes Sister   . Ulcers Father   . Heart disease Father   . Heart failure Father   . Heart attack Father   . AAA (abdominal aortic aneurysm) Father   . Stroke Mother   . Hypertension  Mother   . Colon cancer Neg Hx     ROS- All systems are reviewed and negative except as per the HPI above  Physical Exam: Vitals:   01/30/18 1127  BP: 120/64  Pulse: 84  Weight: 64.4 kg  Height: 5\' 5"  (1.651 m)   Wt Readings from Last 3 Encounters:  01/30/18 64.4 kg  01/25/18 65.2 kg  11/22/17 63.1 kg    Labs: Lab Results  Component Value Date   NA 134 (L) 06/30/2017   K 4.7 06/30/2017   CL 101 06/30/2017   CO2 23 06/30/2017   GLUCOSE 126 (H) 06/30/2017   BUN 28 (H) 06/30/2017   CREATININE 1.54 (H) 06/30/2017   CALCIUM 8.7 (L) 06/30/2017   MG 2.0 08/01/2014   Lab Results  Component Value Date   INR 1.08 01/24/2011   Lab Results  Component Value Date   CHOL 172 08/01/2014   HDL 66 08/01/2014   LDLCALC 88 08/01/2014   TRIG 90 08/01/2014     GEN- The patient is well appearing, alert and oriented x 3 today.   Head- normocephalic, atraumatic Eyes-  Sclera clear, conjunctiva pink Ears- hearing intact Oropharynx- clear Neck- supple, no JVP Lymph- no cervical lymphadenopathy Lungs- Clear to ausculation bilaterally, normal work of breathing Heart- irregular rate and rhythm, no murmurs, rubs or gallops, PMI not laterally displaced GI- soft, NT, ND, + BS Extremities- no clubbing, cyanosis, or edema MS- no significant deformity or atrophy Skin- no rash or lesion Psych- euthymic mood, full affect Neuro- strength and sensation are intact  EKG- Afib at 84 bpm, RBBB qrs int 136 ms, qtc 531 ms Epic records reviewed  Assessment and Plan: 1.  Persistent  afib  I feel that pt's afib is trying to become permanent, as lower doses of amiodarone are not being effective to maintain SR and pt cannot tolerate higher doses He can not take other  qtc prolonging drugs, such as tikosyn or sotalol He is not the best candidate for ablation, per  Dr. Macky Lower last  note, but pt can  discuss this again with Dr. Camnitz,03/09/2018, as scheduled I do not see any clear path to  restoring/maintaining SR  Go back to  amiodarone 100 mg qod as he is not feeling as well on 100 mg daily   Continue for now BB without change  Continue Eliquis 5 mg bid, chadsvasc score is at least 2  F/u with Dr. Curt Bears as scheduled 1/3, I offered to move up the appointment,with the holidays coming up, they wish to wait.  Geroge Baseman , [facility] [address] [place], [place] [address] [phone]

## 2018-01-30 NOTE — Patient Instructions (Signed)
Amiodarone 100mg  every other day

## 2018-02-12 ENCOUNTER — Other Ambulatory Visit: Payer: Self-pay | Admitting: Cardiovascular Disease

## 2018-02-14 ENCOUNTER — Encounter: Payer: Self-pay | Admitting: Cardiology

## 2018-02-27 ENCOUNTER — Other Ambulatory Visit: Payer: Self-pay | Admitting: Cardiovascular Disease

## 2018-03-05 ENCOUNTER — Other Ambulatory Visit: Payer: Self-pay | Admitting: Cardiovascular Disease

## 2018-03-05 NOTE — Telephone Encounter (Addendum)
° ° °  Patient states he has been out of his medication for 11 days.      1. Which medications need to be refilled? (please list name of each medication and dose if known) Zetia 10mg   2. Which pharmacy/location (including street and city if local pharmacy) is medication to be sent to? [id number] - [place], [place] - [address]  3. Do they need a 30 day or 90 day supply? [place]

## 2018-03-07 DIAGNOSIS — J189 Pneumonia, unspecified organism: Secondary | ICD-10-CM

## 2018-03-07 HISTORY — DX: Pneumonia, unspecified organism: J18.9

## 2018-03-09 ENCOUNTER — Ambulatory Visit: Payer: Medicare Other | Admitting: Cardiology

## 2018-03-09 ENCOUNTER — Encounter: Payer: Self-pay | Admitting: Cardiology

## 2018-03-09 VITALS — BP 116/68 | HR 80 | Ht 65.0 in | Wt 144.2 lb

## 2018-03-09 DIAGNOSIS — I4821 Permanent atrial fibrillation: Secondary | ICD-10-CM

## 2018-03-09 MED ORDER — METOPROLOL TARTRATE 100 MG PO TABS: 100.0000 mg | ORAL_TABLET | Freq: Two times a day (BID) | ORAL | 3 refills | Status: DC

## 2018-03-09 NOTE — Progress Notes (Signed)
Electrophysiology Office Note   Date:  03/09/2018   ID:  [name] 11-Jan-1946, MRN [id number]  PCP:  Garwin Brothers, MD  Cardiologist:  Claiborne Billings Primary Electrophysiologist:  Leyah Bocchino Meredith Leeds, MD    No chief complaint on file.    History of Present Illness: [name] is a 73 y.o. male who is being seen today for the evaluation of atrial fibrillation at the request of [name]. Presenting today for electrophysiology evaluation.  He underwent CABG in [place] in 1984.  In 1996 he required redo CABG.  He also had a carotid endarterectomy at the time.  In 1998 he underwent stenting of the left main.  He has renal artery stenosis and underwent stenting in predilation in 2005.  He has a documented right bundle branch block.  He has significant peripheral vascular disease with bilateral SFA occlusions.  He has a subclavian artery occlusion with right subclavian stenosis.  He also has hypertension, hyperlipidemia, and remote smoking history.  Cath in 2012 showed significant coronary artery disease.  He has had a long history of atrial fibrillation with cardioversion.     Today, denies symptoms of palpitations, chest pain, shortness of breath, orthopnea, PND, lower extremity edema, claudication, dizziness, presyncope, syncope, bleeding, or neurologic sequela. The patient is tolerating medications without difficulties.  He, he continues to be weak and fatigued.  He is in atrial fibrillation.  He has not maintained sinus rhythm after cardioversions.  He is tolerating amiodarone but is only taking 100 mg every other day.  At this point, I feel that he is likely in permanent atrial fibrillation.   Past Medical History:  Diagnosis Date  . Anemia   . Angina   . Atrial fibrillation ([place])   . Blood transfusion   . Bronchitis   . Bulging discs    "8 of them; thoracic, lumbar, sacral area"  . Chronic back pain   . Colon polyps   . Complication of anesthesia   . Coronary artery disease  01/24/11   Successful PCI long segmental stensois  vein graft to the CX marginal vessel-graft previously stented prox. 3.0x51mm TAXUS stent mid seg 3.0x58mm TAXUS stent now tandem stens of 3.0x67mm & 3.0x6mm placed with the seg. 50,80 & 99% stenosis reduced to 0%  . DDD (degenerative disc disease)   . Dysrhythmia    "PAC's and PVC's"  . Esophageal dilatation    "have had it done 7 times; last time ~ 2001"  . Esophageal stricture   . GERD (gastroesophageal reflux disease)   . Headache(784.0)   . Hiatal hernia   . Hyperlipidemia   . Myocardial infarction ([place]) 1991  . Myocardial infarction [facility]) 2005   "had 3 heart attacks this year"  . Paroxysmal atrial fibrillation (HCC)   . Pyloric stenosis   . RBBB   . Renal artery stenosis (HCC)    hhistory of right renal artery stenting and re-intervention for "in-stent restenosis.  . S/P CABG (coronary artery bypass graft) [place]  . Shortness of breath    "when I had the heart attacks"  . Subclavian artery stenosis, left ([place])   . Ulcer    "small; Dr. Henrene Pastor found it 02/2010"   Past Surgical History:  Procedure Laterality Date  . ADENOIDECTOMY     "when I was an infant"  . APPENDECTOMY  1953  . CARDIOVERSION N/A 04/17/2017   Procedure: CARDIOVERSION;  Surgeon: Jerline Pain, MD;  Location: [facility] ENDOSCOPY;  Service: Cardiovascular;  Laterality: N/A;  . CAROTID ENDARTERECTOMY Right [place]  01/24/11   "today makes a total of 9 stents"  . CORONARY ARTERY BYPASS GRAFT  1984   CABG X 4  . CORONARY ARTERY BYPASS GRAFT  1996   CABG X 4  . LEFT HEART CATHETERIZATION WITH CORONARY/GRAFT ANGIOGRAM N/A 01/24/2011   Procedure: LEFT HEART CATHETERIZATION WITH Beatrix Fetters;  Surgeon: Troy Sine, MD;  Location: [facility] CATH LAB;  Service: Cardiovascular;  Laterality: N/A;  . pylondial cyst removal    . RETINAL DETACHMENT SURGERY  04/2007   right  .  TONSILLECTOMY  1972     Current Outpatient Medications  Medication Sig Dispense Refill  . apixaban (ELIQUIS) 5 MG TABS tablet Take 1 tablet (5 mg total) by mouth 2 (two) times daily. 180 tablet 3  . butalbital-aspirin-caffeine (FIORINAL) 50-325-40 MG per capsule Take 1 capsule by mouth every 4 (four) hours as needed for headache.     . diltiazem (CARDIZEM) 30 MG tablet Take one tablet by mouth every 4 hours AS NEEDED for A-fib HR > 100 as long as BP > 100. 30 tablet 1  . dutasteride (AVODART) 0.5 MG capsule Take 0.5 mg by mouth daily.     Marland Kitchen esomeprazole (NEXIUM) 40 MG capsule Take 1 capsule (40 mg total) by mouth 2 (two) times daily. 180 capsule 3  . ezetimibe (ZETIA) 10 MG tablet TAKE 1 TABLET (10 MG TOTAL) BY MOUTH DAILY. 90 tablet 3  . Ketotifen Fumarate (ALAWAY OP) Place 1 drop into the right eye daily.    Marland Kitchen loratadine (CLARITIN) 10 MG tablet Take 10 mg by mouth daily as needed for allergies, rhinitis or itching.   3  . lovastatin (MEVACOR) 40 MG tablet Take 20 mg by mouth at bedtime.    . Multiple Vitamins-Minerals (MULTIVITAMINS THER. W/MINERALS) TABS Take 1 tablet by mouth daily.      . nitroGLYCERIN (NITROLINGUAL) 0.4 MG/SPRAY spray Place 1 spray under the tongue every 5 (five) minutes x 3 doses as needed for chest pain. 4.9 g 2  . ranolazine (RANEXA) 1000 MG SR tablet Take 500 mg by mouth 2 (two) times daily.    Marland Kitchen torsemide (DEMADEX) 20 MG tablet Take 20 mg by mouth as needed (for fluid). FOR SWELLING OR WEIGHT GAIN    . vitamin E 400 UNIT capsule Take 400 Units by mouth daily.     . metoprolol tartrate (LOPRESSOR) 100 MG tablet Take 1 tablet (100 mg total) by mouth 2 (two) times daily. 180 tablet 3   No current facility-administered medications for this visit.     Allergies:   Codeine; Sulfa antibiotics; and Sulfonamide derivatives   Social History:  The patient  reports that he quit smoking about 13 years ago. His smoking use included cigarettes. He has a 38.00 pack-year smoking  history. He has never used smokeless tobacco. He reports that he does not drink alcohol or use drugs.   Family History:  The patient's family history includes AAA (abdominal aortic aneurysm) in his father; Breast cancer in his paternal aunt; Diabetes in his sister; Heart attack in his father; Heart disease in his father; Heart failure in his father; Hypertension in his mother; Stroke in his mother; Ulcers in his father.   ROS:  Please see the history of present illness.   Otherwise, review of systems is positive for fatigue, chest pain, leg swelling, palpitations, shortness  of breath, back pain, muscle pain, balance problems, dizziness, easy bruising.   All other systems are reviewed and negative.   PHYSICAL EXAM: VS:  BP 116/68   Pulse 80   Ht 5\' 5"  (1.651 m)   Wt 144 lb 3.2 oz (65.4 kg)   SpO2 97%   BMI 24.00 kg/m  , BMI Body mass index is 24 kg/m. GEN: Well nourished, well developed, in no acute distress  HEENT: normal  Neck: no JVD, carotid bruits, or masses Cardiac: iRRR; no murmurs, rubs, or gallops,no edema  Respiratory:  clear to auscultation bilaterally, normal work of breathing GI: soft, nontender, nondistended, + BS MS: no deformity or atrophy  Skin: warm and dry Neuro:  Strength and sensation are intact Psych: euthymic mood, full affect  EKG:  EKG is ordered today. Personal review of the ekg ordered shows atrial fibrillation, right bundle branch block, left posterior fascicular block, rate 80  Recent Labs: 06/30/2017: ALT 16; BUN 28; Creatinine, Ser 1.54; Hemoglobin 11.2; Platelets 157; Potassium 4.7; Sodium 134; TSH 0.269    Lipid Panel     Component Value Date/Time   CHOL 172 08/01/2014 0852   TRIG 90 08/01/2014 0852   HDL 66 08/01/2014 0852   CHOLHDL 2.6 08/01/2014 0852   VLDL 18 08/01/2014 0852   LDLCALC 88 08/01/2014 0852     Wt Readings from Last 3 Encounters:  03/09/18 144 lb 3.2 oz (65.4 kg)  01/30/18 142 lb (64.4 kg)  01/25/18 143 lb 12.8 oz (65.2  kg)      Other studies Reviewed: Additional studies/ records that were reviewed today include: TTE 08/24/16  Review of the above records today demonstrates:  - Left ventricle: The cavity size was normal. Wall thickness was   increased in a pattern of moderate LVH. Systolic function was   normal. The estimated ejection fraction was in the range of 55%   to 60%. Wall motion was normal; there were no regional wall   motion abnormalities. The study is not technically sufficient to   allow evaluation of LV diastolic function. - Mitral valve: Calcified annulus. Mildly thickened leaflets .   There was mild regurgitation. - Left atrium: The atrium was normal in size. - Right atrium: The atrium was mildly dilated. - Tricuspid valve: There was mild regurgitation. - Pulmonary arteries: PA peak pressure: 48 mm Hg (S). - Inferior vena cava: The vessel was dilated. The respirophasic   diameter changes were blunted (< 50%), consistent with elevated   central venous pressure.  Holter 06/17/16 - personally reviewed The predominant rhythm is atrial fibrillation.  The average heart rate is 82 bpm with a minimum heart rate of 50 to a maximum heart rate of 119 bpm.  There are frequent PVCs with several episodes of bigeminy and trigeminy and a very rare ventricular couplet.  There were no episodes of ventricular tachycardia.  There were no prolonged causes.  ASSESSMENT AND PLAN:  1.  Persistent atrial fibrillation: Currently on Eliquis, amiodarone, and metoprolol.  Unfortunately, I feel that his atrial fibrillation has become permanent.  I did speak with him about the possibility of ablation, but with his significant vascular history and coronary artery disease, I would be hesitant to put him under general anesthesia.  We [name] thus plan for a rate control strategy.  We [name] stop amiodarone today and increase his metoprolol to 100 mg.    This patients CHA2DS2-VASc Score and unadjusted Ischemic Stroke Rate (%  per year) is equal to 2.2 %  stroke rate/year from a score of 2  Above score calculated as 1 point each if present [CHF, HTN, DM, Vascular=MI/PAD/Aortic Plaque, Age if 65-74, or Male] Above score calculated as 2 points each if present [Age > 75, or Stroke/TIA/TE]   2.  Coronary artery disease status post coronary bypass with chronic angina: Has chronic angina when his heart rate gets above 100.  I am increasing his metoprolol which may help improve his symptoms.  Follow-up with Dr. Claiborne Billings.  Case discussed with primary cardiology  Current medicines are reviewed at length with the patient today.   The patient does not have concerns regarding his medicines.  The following changes were made today: Stop amiodarone, increase metoprolol  Labs/ tests ordered today include:  Orders Placed This Encounter  Procedures  . EKG 12-Lead   Case discussed with primary cardiologist  Disposition:   FU with [name] 6 months  Signed, [name] Meredith Leeds, MD  03/09/2018 12:25 PM     [place] [address] [place] [address] [phone] (office) [phone] (fax)

## 2018-03-09 NOTE — Patient Instructions (Signed)
Medication Instructions:  Your physician has recommended you make the following change in your medication:  1. INCREASE Metoprolol tartrate to 100 mg twice a day  * If you need a refill on your cardiac medications before your next appointment, please call your pharmacy.   Labwork: None ordered  Testing/Procedures: None ordered  Follow-Up: Your physician wants you to follow-up in: 6 months with Dr. Curt Bears.  You will receive a reminder letter in the mail two months in advance. If you don't receive a letter, please call our office to schedule the follow-up appointment.   Thank you for choosing [facility]!!   Trinidad Curet, RN [phone]

## 2018-03-21 ENCOUNTER — Telehealth: Payer: Self-pay | Admitting: Cardiology

## 2018-03-21 MED ORDER — METOPROLOL TARTRATE 50 MG PO TABS: 75.0000 mg | ORAL_TABLET | Freq: Two times a day (BID) | ORAL | 3 refills | Status: DC

## 2018-03-21 NOTE — Telephone Encounter (Signed)
Pt calling in reporting worsening SOB since Metoprolol increased on 1/3. (He is currently taking 100 mg BID, previously on 75 BID. Was increased b/c Amiodarone was stopped at same OV) BPs avg 140/80s, HR avg 80s. Pt has taken PRN Diltiazem 3 or 4 times since 11/26.  Pt aware I will forward to [facility] for review/advisement.

## 2018-03-21 NOTE — Telephone Encounter (Signed)
New Message    Pt c/o medication issue:  1. Name of Medication: amiodarone and metoprolol  2. How are you currently taking this medication (dosage and times per day)?   3. Are you having a reaction (difficulty breathing--STAT)? Shortness of breath  4. What is your medication issue? Patient states that on his last visit he was advised to stop amiodarone and increase the metoprolol. He states since then he has noticed that he has had an increase in shortness of breath   Pt c/o Shortness Of Breath: STAT if SOB developed within the last 24 hours or pt is noticeably SOB on the phone   1. Are you currently SOB (can you hear that pt is SOB on the phone)? Yes  2. How long have you been experiencing SOB? Last three days   3. Are you SOB when sitting or when up moving around? Anytime. Moving, sitting or sleeping  4. Are you currently experiencing any other symptoms? no

## 2018-03-21 NOTE — Telephone Encounter (Signed)
Discussed w/ Dr. Curt Bears.  Advised pt to decrease Metoprolol back to 75 mg twice daily. Pt agreeable. Advised to call the office if this does not make an improvement in SOB. Updated dose Rx sent to pharmacy. Patient verbalized understanding and agreeable to plan.

## 2018-04-25 ENCOUNTER — Other Ambulatory Visit: Payer: Self-pay

## 2018-04-25 DIAGNOSIS — Z9889 Other specified postprocedural states: Secondary | ICD-10-CM

## 2018-04-25 DIAGNOSIS — I6523 Occlusion and stenosis of bilateral carotid arteries: Secondary | ICD-10-CM

## 2018-04-25 DIAGNOSIS — I771 Stricture of artery: Secondary | ICD-10-CM

## 2018-04-26 NOTE — Progress Notes (Signed)
HISTORY AND PHYSICAL     CC:  follow up. Requesting Provider:  Garwin Brothers, MD  HPI: This is a 73 y.o. male here for follow up for carotid artery stenosis.  Pt is s/p right CEA by Dr. Donnetta Hutching in 1996.  Pt was last seen January 2019 and at that time, he was asymptomatic.    He has hx of CAD with hx of MI and multiple stent placements.  He has hx of known SFA occlusive dz.  At his last visit, hew was active without claudication sx.   He did have a feeling of dizziness and was evaluated by ENT in [facility].    He presents today for follow up.   He is doing well.  He states that he has a hx of detached retina in the right eye in 2009.  He states that he started having the same sx in the left eye, like a membrane coming over his eye.  He states it was the same as his right eye.  He is seeing an ophthalmologist for this.  He denies amaurosis fugax, facial droop, clumsiness, or hemiparesis.  He states that he has swelling in his ankles bilaterally.  He has hx of BLE vein harvest for CABG.  He has hx of afib and had cardioversion that lasted a couple of weeks.  He states he is worn out since being in afib.  His amiodarone was discontinued.   He tells me that his cardiologist keeps an eye on his aorta and kidney arteries.  He states he had u/s on his leg several years ago with Dr. Rollene Fare.   The pt is on a statin for cholesterol management.  The pt is not diabetic.   The pt is on BB, CCB for hypertension.   Tobacco hx:  remote The pt is not on a daily aspirin. Other AC:  Eliquis    Past Medical History:  Diagnosis Date  . Anemia   . Angina   . Atrial fibrillation ([place])   . Blood transfusion   . Bronchitis   . Bulging discs    "8 of them; thoracic, lumbar, sacral area"  . Chronic back pain   . Colon polyps   . Complication of anesthesia   . Coronary artery disease 01/24/11   Successful PCI long segmental stensois  vein graft to the CX marginal vessel-graft previously stented prox.  3.0x61mm TAXUS stent mid seg 3.0x37mm TAXUS stent now tandem stens of 3.0x46mm & 3.0x31mm placed with the seg. 50,80 & 99% stenosis reduced to 0%  . DDD (degenerative disc disease)   . Dysrhythmia    "PAC's and PVC's"  . Esophageal dilatation    "have had it done 7 times; last time ~ 2001"  . Esophageal stricture   . GERD (gastroesophageal reflux disease)   . Headache(784.0)   . Hiatal hernia   . Hyperlipidemia   . Myocardial infarction ([place]) 1991  . Myocardial infarction [facility] [place]) 2005   "had 3 heart attacks this year"  . Paroxysmal atrial fibrillation ([facility])   . Pyloric stenosis   . RBBB   . Renal artery stenosis ([facility])    hhistory of right renal artery stenting and re-intervention for "in-stent restenosis.  . S/P CABG (coronary artery bypass graft) [place]  . Shortness of breath    "when I had the heart attacks"  . Subclavian artery stenosis, left ([place])   . Ulcer    "small; Dr. Henrene Pastor found  it 02/2010"    Past Surgical History:  Procedure Laterality Date  . ADENOIDECTOMY     "when I was an infant"  . APPENDECTOMY  1953  . CARDIOVERSION N/A 04/17/2017   Procedure: CARDIOVERSION;  Surgeon: Jerline Pain, MD;  Location: [facility] ENDOSCOPY;  Service: Cardiovascular;  Laterality: N/A;  . CAROTID ENDARTERECTOMY Right [place]  01/24/11   "today makes a total of 9 stents"  . CORONARY ARTERY BYPASS GRAFT  1984   CABG X 4  . CORONARY ARTERY BYPASS GRAFT  1996   CABG X 4  . LEFT HEART CATHETERIZATION WITH CORONARY/GRAFT ANGIOGRAM N/A 01/24/2011   Procedure: LEFT HEART CATHETERIZATION WITH Beatrix Fetters;  Surgeon: Troy Sine, MD;  Location[facility] C[facility] LAB;  Service: Cardiovascular;  Laterality: N/A;  . pylondial cyst removal    . RETINAL DETACHMENT SURGERY  04/2007   right  . TONSILLECTOMY  1972    Allergies  Allergen Reactions  . Codeine Nausea And Vomiting  . Sulfa Antibiotics Rash  .  Sulfonamide Derivatives Rash    Current Outpatient Medications  Medication Sig Dispense Refill  . apixaban (ELIQUIS) 5 MG TABS tablet Take 1 tablet (5 mg total) by mouth 2 (two) times daily. 180 tablet 3  . butalbital-aspirin-caffeine (FIORINAL) 50-325-40 MG per capsule Take 1 capsule by mouth every 4 (four) hours as needed for headache.     . diltiazem (CARDIZEM) 30 MG tablet Take one tablet by mouth every 4 hours AS NEEDED for A-fib HR > 100 as long as BP > 100. 30 tablet 1  . dutasteride (AVODART) 0.5 MG capsule Take 0.5 mg by mouth daily.     Marland Kitchen esomeprazole (NEXIUM) 40 MG capsule Take 1 capsule (40 mg total) by mouth 2 (two) times daily. 180 capsule 3  . ezetimibe (ZETIA) 10 MG tablet TAKE 1 TABLET (10 MG TOTAL) BY MOUTH DAILY. 90 tablet 3  . Ketotifen Fumarate (ALAWAY OP) Place 1 drop into the right eye daily.    Marland Kitchen loratadine (CLARITIN) 10 MG tablet Take 10 mg by mouth daily as needed for allergies, rhinitis or itching.   3  . lovastatin (MEVACOR) 40 MG tablet Take 20 mg by mouth at bedtime.    . metoprolol tartrate (LOPRESSOR) 50 MG tablet Take 1.5 tablets (75 mg total) by mouth 2 (two) times daily. 180 tablet 3  . Multiple Vitamins-Minerals (MULTIVITAMINS THER. W/MINERALS) TABS Take 1 tablet by mouth daily.      . nitroGLYCERIN (NITROLINGUAL) 0.4 MG/SPRAY spray Place 1 spray under the tongue every 5 (five) minutes x 3 doses as needed for chest pain. 4.9 g 2  . ranolazine (RANEXA) 1000 MG SR tablet Take 500 mg by mouth 2 (two) times daily.    Marland Kitchen torsemide (DEMADEX) 20 MG tablet Take 20 mg by mouth as needed (for fluid). FOR SWELLING OR WEIGHT GAIN    . vitamin E 400 UNIT capsule Take 400 Units by mouth daily.      No current facility-administered medications for this visit.     Family History  Problem Relation Age of Onset  . Breast cancer Paternal Aunt   . Diabetes Sister   . Ulcers Father   . Heart disease Father   . Heart failure Father   . Heart attack Father   . AAA  (abdominal aortic aneurysm) Father   . Stroke Mother   . Hypertension Mother   . Colon cancer Neg Hx  Social History   Socioeconomic History  . Marital status: Married    Spouse name: Not on file  . Number of children: Not on file  . Years of education: Not on file  . Highest education level: Not on file  Occupational History  . Not on file  Social Needs  . Financial resource strain: Not on file  . Food insecurity:    Worry: Not on file    Inability: Not on file  . Transportation needs:    Medical: Not on file    Non-medical: Not on file  Tobacco Use  . Smoking status: Former Smoker    Packs/day: 1.00    Years: 38.00    Pack years: 38.00    Types: Cigarettes    Last attempt to quit: 02/09/2005    Years since quitting: 13.2  . Smokeless tobacco: Never Used  . Tobacco comment: "quit smoking cigarrettes 2006"  Substance and Sexual Activity  . Alcohol use: No    Alcohol/week: 0.0 standard drinks  . Drug use: No  . Sexual activity: Not on file  Lifestyle  . Physical activity:    Days per week: Not on file    Minutes per session: Not on file  . Stress: Not on file  Relationships  . Social connections:    Talks on phone: Not on file    Gets together: Not on file    Attends religious service: Not on file    Active member of club or organization: Not on file    Attends meetings of clubs or organizations: Not on file    Relationship status: Not on file  . Intimate partner violence:    Fear of current or ex partner: Not on file    Emotionally abused: Not on file    Physically abused: Not on file    Forced sexual activity: Not on file  Other Topics Concern  . Not on file  Social History Narrative  . Not on file     REVIEW OF SYSTEMS:   [X]  denotes positive finding, [ ]  denotes negative finding Cardiac  Comments:  Chest pain or chest pressure:    Shortness of breath upon exertion: x   Short of breath when lying flat:    Irregular heart rhythm:          Vascular    Pain in calf, thigh, or hip brought on by ambulation:    Pain in feet at night that wakes you up from your sleep:     Blood clot in your veins:    Leg swelling:         Pulmonary    Oxygen at home:    Productive cough:     Wheezing:         Neurologic    Sudden weakness in arms or legs:     Sudden numbness in arms or legs:     Sudden onset of difficulty speaking or slurred speech:    Temporary loss of vision in one eye:     Problems with dizziness:         Gastrointestinal    Blood in stool:     Vomited blood:         Genitourinary    Burning when urinating:     Blood in urine:        Psychiatric    Major depression:         Hematologic    Bleeding problems:    Problems with blood  clotting too easily:        Skin    Rashes or ulcers:        Constitutional    Fever or chills:      PHYSICAL EXAMINATION:  Today's Vitals   04/27/18 1338  BP: 124/62  Pulse: 60  Resp: 14  Temp: (!) 97.4 F (36.3 C)  TempSrc: Oral  SpO2: 100%  Weight: 140 lb 14 oz (63.9 kg)  Height: 5\' 5"  (1.651 m)   Body mass index is 23.44 kg/m.   General:  WDWN in NAD; vital signs documented above Gait: Not observed HENT: WNL, normocephalic Pulmonary: normal non-labored breathing , without Rales, rhonchi,  wheezing Cardiac: irregular HR, without  Murmurs, rubs or gallops; with carotid bruits bilaterally Abdomen: soft, NT, no masses; aorta is palpable Skin: without rashes Vascular Exam/Pulses:  Right Left  Radial 2+ (normal) 2+ (normal)  Femoral 2+ (normal) 2+ (normal)  Popliteal Unable to palpate  Unable to palpate   DP Unable to palpate  Unable to palpate   PT Unable to palpate  Unable to palpate    Extremities: without ischemic changes, without Gangrene , without cellulitis; without open wounds;  Musculoskeletal: no muscle wasting or atrophy  Neurologic: A&O X 3 Psychiatric:  The pt has Normal affect.   Non-Invasive Vascular Imaging:   Carotid Duplex on  04/27/2018: Right:  40-59% stenosis Left:  1-39% stenosis Vertebrals:  Right vertebral artery demonstrates antegrade flow. Left vertebral              artery demonstrates retrograde flow. Subclavians: Right subclavian artery was stenotic. Left subclavian artery flow              was disturbed.  Previous Carotid duplex on 03/28/17: Right: 40-59% stenosis Left:   1-39% stenosis Vertebrals:  Both vertebral arteries were patent with antegrade flow. A left              subclavian artery steal was noted. Subclavians: Left subclavian artery was stenotic. Normal flow hemodynamics were              seen in the right subclavian artery.   ASSESSMENT/PLAN:: 73 y.o. male here for follow up carotid artery stenosis.   -pt asymptomatic from carotid stenosis.  He will continue his statin.  He is on Eliquis for afib and therefore does not take asa. -discussed s/s of stroke with pt and they understand should they develop any of these sx, they will go to the nearest ER. -palpable aorta:  Pt states that this is followed with u/s when he has his renal artery duplex with Dr. Gwenlyn Found.  This has been normal in the past. Continue surveillance per Dr. Gwenlyn Found. -he does not have claudication or non healing wounds.  He does have numbness in his feet ever since vein harvest for CABG.  He had decreased ABI's in 2014.  Will plan for ABI's the next visit to see where we stand with is lower extremity blood flow.  He will call sooner if he has any non healing wounds or rest pain.  Discussed with him to keep an eye on his feet and check them daily.  Advised him not to cut his own toenails as he states he has cut himself a couple of times doing this.   Leontine Locket, PA-C [facility] [phone]  Clinic MD:  Donzetta Matters

## 2018-04-27 ENCOUNTER — Ambulatory Visit (HOSPITAL_COMMUNITY)
Admission: RE | Admit: 2018-04-27 | Discharge: 2018-04-27 | Disposition: A | Discharge status: Home / Self Care | Payer: Medicare Other | Source: Ambulatory Visit | Attending: Physician Assistant | Admitting: Physician Assistant

## 2018-04-27 ENCOUNTER — Encounter: Payer: Self-pay | Admitting: Physician Assistant

## 2018-04-27 ENCOUNTER — Ambulatory Visit: Payer: Medicare Other | Admitting: Physician Assistant

## 2018-04-27 ENCOUNTER — Other Ambulatory Visit: Payer: Self-pay

## 2018-04-27 VITALS — BP 124/62 | HR 60 | Temp 97.4°F | Resp 14 | Ht 65.0 in | Wt 140.9 lb

## 2018-04-27 DIAGNOSIS — I6523 Occlusion and stenosis of bilateral carotid arteries: Secondary | ICD-10-CM

## 2018-04-27 DIAGNOSIS — I771 Stricture of artery: Secondary | ICD-10-CM

## 2018-04-27 DIAGNOSIS — Z9889 Other specified postprocedural states: Secondary | ICD-10-CM

## 2018-05-12 ENCOUNTER — Other Ambulatory Visit: Payer: Self-pay | Admitting: Cardiology

## 2018-05-25 ENCOUNTER — Ambulatory Visit: Payer: Medicare Other | Admitting: Cardiovascular Disease

## 2018-05-25 ENCOUNTER — Encounter: Payer: Self-pay | Admitting: Cardiovascular Disease

## 2018-05-25 VITALS — BP 102/54 | HR 85 | Ht 65.0 in | Wt 129.0 lb

## 2018-05-25 DIAGNOSIS — Z7901 Long term (current) use of anticoagulants: Secondary | ICD-10-CM

## 2018-05-25 DIAGNOSIS — I739 Peripheral vascular disease, unspecified: Secondary | ICD-10-CM

## 2018-05-25 DIAGNOSIS — I25119 Atherosclerotic heart disease of native coronary artery with unspecified angina pectoris: Secondary | ICD-10-CM

## 2018-05-25 DIAGNOSIS — E0590 Thyrotoxicosis, unspecified without thyrotoxic crisis or storm: Secondary | ICD-10-CM

## 2018-05-25 DIAGNOSIS — I10 Essential (primary) hypertension: Secondary | ICD-10-CM

## 2018-05-25 DIAGNOSIS — Z951 Presence of aortocoronary bypass graft: Secondary | ICD-10-CM | POA: Diagnosis not present

## 2018-05-25 DIAGNOSIS — I4892 Unspecified atrial flutter: Secondary | ICD-10-CM

## 2018-05-25 DIAGNOSIS — I452 Bifascicular block: Secondary | ICD-10-CM

## 2018-05-25 DIAGNOSIS — E785 Hyperlipidemia, unspecified: Secondary | ICD-10-CM | POA: Diagnosis not present

## 2018-05-25 LAB — LIPID PANEL
CHOL/HDL RATIO: 2.3 ratio (ref 0.0–5.0)
Cholesterol, Total: 143 mg/dL (ref 100–199)
HDL: 63 mg/dL (ref >39)
LDL Calculated: 63 mg/dL (ref 0–99)
Triglycerides: 84 mg/dL (ref 0–149)
VLDL Cholesterol Cal: 17 mg/dL (ref 5–40)

## 2018-05-25 LAB — TSH: TSH: 0.254 u[IU]/mL — AB (ref 0.450–4.500)

## 2018-05-25 NOTE — Progress Notes (Signed)
Patient ID: [name], male   DOB: 1945-08-29, 73 y.o.   MRN: [id number]    Primary MD:  [name]  HPI: [name] is [name] 73 y.o. male who is [name] former patient of [name].  He presents for [name] 2 month follow-up cardiology evaluation.  [name] has an extensive cardiac and peripheral vascular history. He underwent initial CABG revascularization surgery in [place] in 1984. In 1996 he required re-do CABG revascularization surgery after all grafts were [name] to be occluded. At that time he also underwent right carotid endarterectomy. In 1998 he underwent stenting of his left main coronary artery and also had stenting done to the vein graft supplying the marginal vessel. He has history of right renal artery stenosis and underwent initial stenting with predilatation 2005. He has documented chronic right bundle branch block. He also significant peripheral vascular disease with bilateral SFA occlusion. He has known left subclavian artery occlusion with right subclavian stenosis. Has [name] history of hypertension, hyperlipidemia, and remotely smoked cigarettes. His last catheterization was done in November 2012 which showed significant native CAD with evidence for patent left main stent. He had an occluded circumflex marginal vessel from the AV groove circumflex. There was 80% ostial RCA stenosis with 90% stenosis in the mid right coronary artery proximal to the RV marginal branch and total occlusion of the mid RCA. An occluded vein graft supplying the right coronary artery but evidence for collateralization to the distal right artery the left coronary injection and injection of the grafts. The vein supplying the marginal vessel with the previously placed ostial proximal Taxus stent had in-stent narrowing of 50% followed by 95% stenosis in the body of the proximal third of the vessel graft prior to the previously placed mid grafts 3.0x20 mm Taxus stent. At that time, he underwent difficult but  successful intervention involving long segmental stenosis of the vein graft supplying the circumflex marginal vessel. Additional tandem 3.0x38 mm and 3.0x8 mm Taxus stents were inserted with excellent angiographic result.   He saw [name] in August 2014 and due to concern for progressive carotid stenoses repeat Doppler studies were done and he also saw [name] early for followup evaluation. Repeat imaging showed [name] patent right carotid endarterectomy site the Doppler velocity suggestive of 40  percent stenosis of the bilateral proximal internal carotid arteries. There was left subclavian steal noted with retrograde left vertebral artery flow, monophasic left subclavian artery flow and significant difference in the bilateral brachial pressures.  He established cardiology care with me in December 2014.  [name] nuclear perfusion study in May 2015 demonstrated mild inferobasal ischemia, most likely secondary to RCA distribution.    On 03/25/2014 he was seen by [name] after he developed an episode of atrial fibrillation the day before.  An ECG in [name]  office showed atrial fibrillation with rate of 120.  His atenolol was increased to 50 mg twice [name], and he converted to sinus rhythm after 18 hours.  When he saw [name] the following day he was in sinus rhythm.  On the increased dose of beta blocker he denies recurrent palpitations.  He tells me he recently underwent [name] colonoscopy and had 3 polyps removed, but also required cauterization for another site that was oozing blood.  This reason, he was not restarted on anticoagulation but has been maintained on low-dose aspirin and Plavix.  He has seen [name] in follow-up of his PVD and is status post  left renal stenting.  He also is followed by [name] for his carotid disease.  In August 2017 he was seen by [name] after he was [name] to be in atrial fibrillation 1 week previously at cardiac rehabilitation.  He was started on amiodarone 200 mg  twice [name] for 1 week and then 200 mg daily and his atenolol was reduced to 25 mg twice [name].  He was seen several weeks later on 10/29/2015 by [name] and was back in sinus rhythm.  He underwent carotid duplex imaging on 12/23/2015.  The right carotid endarterectomy site had Doppler velocities suggestive of [name] 60-79% stenosis. The left internal carotid artery velocity suggested [name] 40-59% stenoses.  Renal duplex imaging to just [name] patent right renal artery stent.  There was stable greater than 60% left renal artery stenosis.    When I  saw him in November 2017 he was maintaining sinus rhythm and  since he was on amiodarone, I recommended he reduce lovastatin to 20 mg and added Zetia for continued aggressive lipid management.  He had progressive lower extremity edema and recommended he increase his torsemide.  In March 2018 he developed episodes of bronchitis.  Since that time his rhythm has been irregular.  He wore [name] Holter monitor which showed atrial fibrillation with [name] at 52 at 4:02 AM was sleeping and maximum rate at 119.  In the afternoon with an average rate of 82 bpm.  There are frequent PVCs, rare couplets, occasional episodes of bigeminy and trigeminy but no episodes of VT.  There were no pauses.  Previously, his blood pressure had become low and hehad been on losartan and I saw him one month ago there was leg swelling, left greater than right.  He had [name] chest x-ray with [name] on 06/21/2016.  [name] carotid Doppler study in April 2018 showed improvement in his velocities such that his right carotid endarterectomy was patent with velocities now suggestive of 40-59% stenosis.  The left internal carotid velocities suggested [name] 1-39% stenosis.   When I saw him in May 2018, he was in atrial fibrillation.  I discontinued Plavix and started him on eliquis 5 mg twice [name].  I recommended that he not to take aspirin with his GI history.  I increased amiodarone to 200 mg twice [name]  and reduced his Ranexa dose to 500 mg twice [name] with concomitant therapy.  He was having lower extremity edema.  [name] Doppler study was negative for DVT by his primary physician.  I recommended support stockings.    I saw him 1 month later and he continued to be in atrial fibrillation.  At that time, we discussed potential DC cardioversion for restoration of sinus rhythm.  He underwent repeat echo on 08/24/2016 which showed an EF of 55-60%.  His left atrium was normal in size and his right atrium was mildly dilated.  PA pressure was increased at 48 mm.  There was moderate LVH. Marland Kitchen  He apparently was seen subsequent to that evaluation by [name] and was having 2+ bilateral lower extremity edema.  Further discussion concerning cardioversion was undertaken but the patient initially deferred at that point and wanted to that it further.  He tells me 1 month ago he noticed some bright red blood per rectum.  As result, he self reduced his eliquis from 5 mg twice [name] down to 5 mg daily.  Remotely, he has seen Dr. Scarlette Shorts in  the past.  His last colonoscopy was at least 5 years ago.  He had stopped taking amiodarone since he felt to contribute to fatigue, tremors, dizziness, and gait staggering.  The symptomshave improved off amiodarone antiarrhythmic therapy.  Her, he has noticed that at times his pulse can increase up to the low 100s with minimal activity.  He has now been using compression stockings for his ankle edema which has improved.    When I  saw him in November 2018 , I recommended eliquis 5 mg twice [name] for full anticoagulation.  I started him on metoprolol 50 mg twice [name]  I discussed options concerning continuing permanent AF versus possible Tikosyn or AF ablation and referred him for  EP evaluation.  He saw Dr. Curt Bears in January 2019. Due to his QTC prolongation  he was not felt to be [name] candidate for Tikosyn or sotalol [name] and also was felt to be high risk for ablation.  As result he recommended  reiitiation of low-dose amiodarone.n 04/12/2017.  He underwent successful cardioversion.  When he saw [name] on 04/19/2017.  At that time, the patient had complaints of increasing dyspnea on exertion and orthopnea, which were similar to his previous symptoms on amiodarone.  When he last saw [name] on May 02, 2017 he also complained of [name] fine tremor since institution of amiodarone, and amiodarone  was reduced to 100 mg every other day.   He underwent carotid duplex imaging which showed mild bilateral carotid plaque 40-59% on the right and 1-39% in the left.  In addition, an abdominal renal duplex study showed patent right renal artery stent, moderate left renal artery stenosis, and significant SMA stenosis.  This was reviewed by [name].  When seen in March 2019 he felt his tremor and loss of coordination on the higher dose of amiodarone  improved with the lower dose amiodarone.  He was not been taking Demadex for the past 10 days and this has resulted in increasing leg swelling.  Subsequently, he was seen in the atrial fibrillation clinic by [name] several occasions, last seen Jul 14, 2017.  Prior to that evaluation he had seen his PCP for intermittent fevers.  When last seen in [name]. fib clinic he was in sinus rhythm. He denies any recurrent symptoms.  He continues to be on amiodarone 100 mg every other day and Eliquis for anticoagulation.  He is on metoprolol 75 mg twice [name], Ranexa 500 mg twice [name] and his dose was reduced when amiodarone was instituted.  He is on lovastatin 40 mg for hyperlipidemia.  He has been taking torsemide on an as-needed basis.  He has [name] prescription for diltiazem 30 mg to take if his heart rate increases above 100 and long as blood pressure is greater than 100.    When I saw him in September 2018 he was back in atrial fibrillation with ventricular rate at 76 bpm.  His right bundle branch block was stable and there was evidence for left posterior hemiblock.   Had only been taking amiodarone 100 mg every other day and I suggested that he further titrate his back to 100 mg daily.  With this slightly higher dose he has noticed some slight increase in some balance and coordination issues.  He denies any anginal symptoms.  He has noted some leg swelling bilaterally.   Since I last saw him in November 2019, he has seen Dr. Curt Bears in January 2020.  He was  not atrial fibrillation and has not maintained sinus rhythm after his cardioversion.  At that time, he recommended discontinuance of amiodarone and that he continue in permanent atrial fibrillation.  [name] tells me that at the end of February he developed increasing chest congestion, went to urgent care and was treated with antibiotics for 10 days.  Reportedly [name] chest x-ray was negative.  He has continued to have chest congestion.  He denies any fevers.  He episodes of chest pain.   Past Medical History:  Diagnosis Date   Anemia    Angina    Atrial fibrillation ([place])    Blood transfusion    Bronchitis    Bulging discs    "8 of them; thoracic, lumbar, sacral area"   Chronic back pain    Colon polyps    Complication of anesthesia    Coronary artery disease 01/24/11   Successful PCI long segmental stensois  vein graft to the CX marginal vessel-graft previously stented prox. 3.0x59m TAXUS stent mid seg 3.0x244mTAXUS stent now tandem stens of 3.0x3876m 3.0x8mm73maced with the seg. 50,80 & 99% stenosis reduced to 0%   DDD (degenerative disc disease)    Dysrhythmia    "PAC's and PVC's"   Esophageal dilatation    "have had it done 7 times; last time ~ 2001"   Esophageal stricture    GERD (gastroesophageal reflux disease)    Headache(784.0)    Hiatal hernia    Hyperlipidemia    [facility]   Myocardial infarction ([facility])[place]   "had 3 heart attacks this year"   Paroxysmal atrial fibrillation (HCC)[place] Pyloric stenosis    RBBB    Renal artery stenosis  (HCC)    hhistory of right renal artery stenting and re-intervention for "in-stent restenosis.   S/P CABG (coronary artery bypass graft) 1984[place] of breath    "when I had the heart attacks"   Subclavian artery stenosis, left (HCC)[place] [facility]    "small; Dr. PerrHenrene Pastornd it 02/2010"    Past Surgical History:  Procedure Laterality Date   ADENOIDECTOMY     "when I was an infant"   [place] 04/17/2017   Procedure: CARDIOVERSION;  Surgeon: SkaiJerline Pain;  [facility];  Service: Cardiovascular;  Laterality: N/[name];   CAROTID ENDARTERECTOMY Right 1996[place]/19/12   "today makes [name] total of 9 stents"   CORONARY ARTERY BYPASS GRAFT  1984   CABG X 4   CORONARY ARTERY BYPASS GRAFT  1996   CABG X 4   LEFT HEART CATHETERIZATION WITH CORONARY/GRAFT ANGIOGRAM N/[name] 01/24/2011   Procedure: LEFT HEART CATHETERIZATION WITH COROBeatrix Fettersurgeon: ThomTroy Sine;  [facility];  Service: Cardiovascular;  Laterality: N/[name];   pylondial cyst removal     RETINAL DETACHMENT SURGERY  04/2007   right   TONSILLECTOMY  1972    Allergies  Allergen Reactions   Codeine Nausea And Vomiting   Sulfa Antibiotics Rash   Sulfonamide Derivatives Rash    Current Outpatient Medications  Medication Sig Dispense Refill   butalbital-aspirin-caffeine (FIORINAL) 50-325-40 MG per capsule Take 1 capsule by mouth every 4 (four) hours as needed for headache.      diltiazem (CARDIZEM) 30 MG tablet Take one tablet by mouth every 4 hours AS NEEDED  for [name]-fib HR > 100 as long as BP > 100. 30 tablet 1   dutasteride (AVODART) 0.5 MG capsule Take 0.5 mg by mouth daily.      ELIQUIS 5 MG TABS tablet TAKE 1 TABLET BY MOUTH TWICE [name] 180 tablet 3   esomeprazole (NEXIUM) 40 MG capsule Take 1 capsule (40 mg total) by mouth 2 (two) times daily. 180  capsule 3   ezetimibe (ZETIA) 10 MG tablet TAKE 1 TABLET (10 MG TOTAL) BY MOUTH DAILY. 90 tablet 3   Ketotifen Fumarate (ALAWAY OP) Place 1 drop into the right eye daily.     loratadine (CLARITIN) 10 MG tablet Take 10 mg by mouth daily as needed for allergies, rhinitis or itching.   3   lovastatin (MEVACOR) 40 MG tablet Take 20 mg by mouth at bedtime.     metoprolol tartrate (LOPRESSOR) 50 MG tablet Take 1.5 tablets (75 mg total) by mouth 2 (two) times daily. 180 tablet 3   Multiple Vitamins-Minerals (MULTIVITAMINS THER. W/MINERALS) TABS Take 1 tablet by mouth daily.       nitroGLYCERIN (NITROLINGUAL) 0.4 MG/SPRAY spray Place 1 spray under the tongue every 5 (five) minutes x 3 doses as needed for chest pain. 4.9 g 2   ranolazine (RANEXA) 1000 MG SR tablet Take 500 mg by mouth 2 (two) times daily.     torsemide (DEMADEX) 20 MG tablet Take 20 mg by mouth as needed (for fluid). FOR SWELLING OR WEIGHT GAIN     vitamin E 400 UNIT capsule Take 400 Units by mouth daily.      No current facility-administered medications for this visit.     Social History   Socioeconomic History   Marital status: Married    Spouse name: Not on file   Number of children: Not on file   Years of education: Not on file   Highest education level: Not on file  Occupational History   Not on file  Social Needs   Financial resource strain: Not on file   Food insecurity:    Worry: Not on file    Inability: Not on file   Transportation needs:    Medical: Not on file    Non-medical: Not on file  Tobacco Use   Smoking status: Former Smoker    Packs/day: 1.00    Years: 38.00    Pack years: 38.00    Types: Cigarettes    Last attempt to quit: 02/09/2005    Years since quitting: 13.2   Smokeless tobacco: Never Used   Tobacco comment: "quit smoking cigarrettes 2006"  Substance and Sexual Activity   Alcohol use: No    Alcohol/week: 0.0 standard drinks   Drug use: No   Sexual activity: Not on  file  Lifestyle   Physical activity:    Days per week: Not on file    Minutes per session: Not on file   Stress: Not on file  Relationships   Social connections:    Talks on phone: Not on file    Gets together: Not on file    Attends religious service: Not on file    Active member of club or organization: Not on file    Attends meetings of clubs or organizations: Not on file    Relationship status: Not on file   Intimate partner violence:    Fear of current or ex partner: Not on file    Emotionally abused: Not on file    Physically abused: Not on file  Forced sexual activity: Not on file  Other Topics Concern   Not on file  Social History Narrative   Not on file   Socially he is married has 2 children 2 stepchildren. He quit tobacco in 2000.  Family History  Problem Relation Age of Onset   Breast cancer Paternal Aunt    Diabetes Sister    Ulcers Father    Heart disease Father    Heart failure Father    Heart attack Father    AAA (abdominal aortic aneurysm) Father    Stroke Mother    Hypertension Mother    Colon cancer Neg Hx    ROS General: Negative; No fevers, chills, or night sweats;  HEENT: History of [name] detached retina in his right eye; No changes in  hearing, sinus congestion, difficulty swallowing Pulmonary: Negative; No cough, wheezing, shortness of breath, hemoptysis Cardiovascular: see HPI;  GI: s/p polypectomy and rare red blood per rectum followed by Dr. Henrene Pastor. GU: Occasional concentrated or  dark urine without blood Musculoskeletal: Negative; no myalgias, joint pain, or weakness Hematologic/Oncology: Positive for anemia Endocrine: Negative; no heat/cold intolerance; no diabetes Neuro: Negative; no changes in balance, headaches Skin: Negative; No rashes or skin lesions Psychiatric: Negative; No behavioral problems, depression Sleep: Negative; No snoring, daytime sleepiness, hypersomnolence, bruxism, restless legs, hypnogognic  hallucinations, no cataplexy Other comprehensive 14 point system review is negative.   PE BP (!) 102/54    Pulse 85    Ht 5' 5"  (1.651 m)    Wt 129 lb (58.5 kg)    BMI 21.47 kg/m    Repeat blood pressure by me was 128/60 supine and 130/64 standing  Wt Readings from Last 3 Encounters:  05/25/18 129 lb (58.5 kg)  04/27/18 140 lb 14 oz (63.9 kg)  03/09/18 144 lb 3.2 oz (65.4 kg)   General: Alert, oriented, no distress.  Skin: normal turgor, no rashes, warm and dry HEENT: Normocephalic, atraumatic. Pupils equal round and reactive to light; sclera anicteric; extraocular muscles intact;  Nose without nasal septal hypertrophy Mouth/Parynx benign; Mallinpatti scale 3 Neck: No JVD, no carotid bruits; normal carotid upstroke Lungs: Decreased breath sounds with occasional rhonchi.  No wheezing. Chest wall: without tenderness to palpitation Heart: PMI not displaced, fairly regular rhythm, s1 s2 normal, 1/6 systolic murmur, no diastolic murmur, no rubs, gallops, thrills, or heaves Abdomen: soft, nontender; no hepatosplenomehaly, BS+; abdominal aorta nontender and not dilated by palpation. Back: no CVA tenderness Pulses 2+ Musculoskeletal: full range of motion, normal strength, no joint deformities Extremities: No edema with support stockings; no clubbing cyanosis, Homan's sign negative  Neurologic: grossly nonfocal; Cranial nerves grossly wnl Psychologic: Normal mood and affect  ECG (independently read by me): Probable atrial flutter with 3-1 block and [name] ventricular rate at 85.  Bundle branch block with repolarization changes.    January 25, 2018 ECG (independently read by me): Atrial fibrillation at 77 bpm.  Right bundle branch block with repolarization changes.  QTc interval 506 ms.  November 22, 2017 ECG (independently read by me): Atrial fibrillation at 76 bpm.  Right bundle branch block with repolarization changes.  Left posterior hemiblock.  Nonspecific ST-T changes  May 2019 ECG  (independently read by me): Normal sinus rhythm at 63 bpm.  Right bundle branch block with repolarization changes.  Nonspecific ST changes.  QTc interval 493 ms.    March 2019 ECG (independently read by me): normal sinus rhythm at 64 bpm.  Right bundle branch block with repolarization changes.  Left posterior  hemiblock.  QTc interval 497 ms.    November 2018 ECG (independently read by me): Atrial flutter with variable block.  PVC.  Left posterior hemiblock.  Right bundle branch block.  June 2018 ECG (independently read by me): Atrial fibrillation with ventricular rate at 70 bpm.  Right bundle-branch block with repolarization changes.  Probable left posterior fascicular block.  07/21/2016 ECG (independently read by me): atrial fibrillation with [name] controlled ventricular rate at 71 bpm.  Right bundle branch block and left posterior hemiblock.  Small inferior Q waves.  November 2017 ECG (independently read by me): Sinus rhythm with [name] PVC.  Right bundle branch block and probable left posterior hemiblock.  Inferior Q waves with preserved R waves.  February 2017 ECG (independently read by me): normal sinus rhythm at 61 bpm. Probable left atrial enlargement. Right bundle branch block.  ECG (independently read by me): Sinus bradycardia 58 bpm.  Increased QTc interval 473 ms.  Right bundle-branch block.  May 2016 ECG (independently read by me): Sinus bradycardia 59 bpm with right bundle branch block.I ST-T changes.  QTc interval 475 ms.  PR interval 182 ms.  No ectopy.  February 2016 ECG (independently read by me) normal sinus rhythm at 63 bpm.  Right bundle-branch block.  Probable left posterior fascicular block.  PR interval 170 ms.  QTC interval 480 ms.  November 2015 ECG (independently read by me): Normal sinus rhythm at 62 bpm.  Right bundle branch block.  Small nondiagnostic inferior Q waves.  Prior May 2015ECG : sinus rhythm at 58 beats per minute.  Right bundle branch block.  Prior December  2014 ECG: Sinus rhythm with sinus bradycardia 55 beats per minute. Right bundle branch block.  LABS: BMP Latest Ref Rng & Units 06/30/2017 05/30/2017 05/02/2017  Glucose 65 - 99 mg/dL 126(H) 128(H) 97  BUN 6 - 20 mg/dL 28(H) 22 26  Creatinine 0.61 - 1.24 mg/dL 1.54(H) 1.33(H) 1.31(H)  BUN/Creat Ratio 10 - 24 - 17 20  Sodium 135 - 145 mmol/L 134(L) 143 141  Potassium 3.5 - 5.1 mmol/L 4.7 5.0 4.7  Chloride 101 - 111 mmol/L 101 107(H) 104  CO2 22 - 32 mmol/L 23 19(L) 23  Calcium 8.9 - 10.3 mg/dL 8.7(L) 8.8 8.9   Hepatic Function Latest Ref Rng & Units 06/30/2017 05/30/2017 05/02/2017  Total Protein 6.5 - 8.1 g/dL 6.5 6.1 6.2  Albumin 3.5 - 5.0 g/dL 3.2(L) 4.1 3.8  AST 15 - 41 U/L 22 19 17   ALT 17 - 63 U/L 16(L) 12 12  Alk Phosphatase 38 - 126 U/L 98 109 92  Total Bilirubin 0.3 - 1.2 mg/dL 0.6 0.2 0.2  Bilirubin, Direct 0.00 - 0.40 mg/dL - - 0.11   CBC Latest Ref Rng & Units 06/30/2017 05/30/2017 04/17/2017  WBC 4.0 - 10.5 K/uL 7.3 6.1 -  Hemoglobin 13.0 - 17.0 g/dL 11.2(L) 11.4(L) 11.6(L)  Hematocrit 39.0 - 52.0 % 33.0(L) 33.5(L) 34.0(L)  Platelets 150 - 400 K/uL 157 163 -   Lab Results  Component Value Date   MCV 91.2 06/30/2017   MCV 91 05/30/2017   MCV 97.0 11/25/2014   Lab Results  Component Value Date   TSH 0.269 (L) 06/30/2017    Lipid Panel     Component Value Date/Time   CHOL 172 08/01/2014 0852   TRIG 90 08/01/2014 0852   HDL 66 08/01/2014 0852   CHOLHDL 2.6 08/01/2014 0852   VLDL 18 08/01/2014 0852   LDLCALC 88 08/01/2014 0852     RADIOLOGY:  No results [name].  IMPRESSION:  1. Atrial flutter, unspecified type ([place])   2. Hyperlipidemia with target LDL less than 70   3. Coronary artery disease involving native coronary artery of native heart with unspecified  angina pectoris ([place]) on anti-ischemic meds   4. Hx of CABG   5. RBBB (right bundle branch block with left anterior fascicular block)   6. Hyperthyroidism   7. Essential hypertension   8. Chronic  anticoagulation   9. PAD (peripheral artery disease) ([place])     ASSESSMENT AND PLAN: [name] is [name] 72 year old gentleman who has an extensive CAD/PVD history, and is 35 years status post his initial CABG revascularization surgery done in [place] in 1984 and 23 years status post his second CABG surgery in 1996. He has had multiple peripheral vascular procedures including carotid endarterectomy, renal stenting, and has documented SFA occlusion. His last catheterization/ intervention in 2012 demonstrated diffuse disease in the vein graft supplying the marginal vessel and 2 additional Taxus stents inserted commencing ostially and extending to the proximal portion of the recently placed mid prior stent. He has [name] known occluded graft to the RCA with [name] mid RCA occlusion with left to right collaterals. He also has subclavian steal. He does have mild hypokinesis inferolaterally in the basal inferior wall with an EF of 45% at that catheterization. He has chronic right bundle branch block.  Previously he had complained of mild chest anginal symptomatology typically when he starts exercise and particularly after eating. He had potential areas of myocardium which may be responsible for ischemia and I added  Ranexa 500 mg twice [name] to his medical regimen and subsequently this was further titrated to 1000 mg twice [name] , which resolved his symptomatology.  Because of issues with atrial fibrillation and [name] trial of amiodarone therapy in the past, Ranexa dose was reduced due to concomitant medication.  He has undergone EP evaluation for other options.  In the past he did not tolerate higher dose amiodarone which resulted in unsteadiness and balance issues.  Since I last saw him, he was taken off amiodarone by Dr. Curt Bears with permanent atrial fibrillation.  His ECG today, suggests atrial flutter with 3-1 block with [name] ventricular rate at 85 bpm.  He is not having any anginal symptoms.  He is not aware of any  palpitations and has been taking metoprolol tartrate 75 mg twice [name] and has [name] prescription for diltiazem 30 mg which she has not yet needed to take.  He also takes torsemide 20 mg as needed swelling and continues to use support stockings.  He recently has had issues with chest congestion and URI symptoms and did receive antibiotic therapy at urgent care and had followed up with [name] his primary physician in [place].  I reviewed laboratory from 06/02/2018 which revealed stable renal function and hemoglobin.Marland Kitchen  His last lipid studies were done in May 2019.  He is fasting today.  I will check [name] fasting lipid panel as well as TSH.  Target LDL is less than 70.  He has been maintained on Zetia in addition to lovastatin and if not at target would recommend changing to [name] more potent statin regimen.  He continues to be on Eliquis for his anticoagulation and denies bleeding.  His blood pressure today is stable.  He continues to have moderate chest congestion on exam.  I have suggested [name] follow-up evaluation with his primary physician.     When last seen  in September 2019 he developed recurrent AF when only taken amiodarone 100 mg every other day.  For the past several months he has been back on 100 mg daily but continues to be in atrial fibrillation.  He will be having [name] follow-up appointment to see Dr. Curt Bears and I have suggested perhaps at this appointment be moved up sooner rather than as scheduled sometime in January.  I am further titrating metoprolol to 100 mg in the morning and 75 mg at night from his present dose of 75 mg twice [name].  He continues to be on Eliquis for anticoagulation and denies bleeding.  He is not having any anginal symptoms and continues to be on Ranexa 500 mg twice [name].  He has only been taking torsemide as needed for swelling and on exam today there is 1+ edema.  Support stockings were recommended.  He will undergo follow-up EP evaluation and I will see him back in 4 to 5 months  for reassessment.    Troy Sine, MD, [facility]  05/25/2018 10:54 AM

## 2018-05-25 NOTE — Patient Instructions (Addendum)
Medication Instructions:  The current medical regimen is effective;  continue present plan and medications.  If you need a refill on your cardiac medications before your next appointment, please call your pharmacy.   Lab work: Lipid, TSH today.  If you have labs (blood work) drawn today and your tests are completely normal, you will receive your results only by: Marland Kitchen MyChart Message (if you have MyChart) OR . A paper copy in the mail If you have any lab test that is abnormal or we need to change your treatment, we will call you to review the results.   Follow-Up: At [facility], you and your health needs are our priority.  As part of our continuing mission to provide you with exceptional heart care, we have created designated Provider Care Teams.  These Care Teams include your primary Cardiologist (physician) and Advanced Practice Providers (APPs -  Physician Assistants and Nurse Practitioners) who all work together to provide you with the care you need, when you need it. You will need a follow up appointment in 6 months.  Please call our office 2 months in advance to schedule this appointment.  You may see Shelva Majestic, MD  or one of the following Advanced Practice Providers on your designated Care Team: [place], [place] . Fabian Sharp, PA-C

## 2018-06-11 ENCOUNTER — Other Ambulatory Visit: Payer: Self-pay

## 2018-06-11 MED ORDER — ESOMEPRAZOLE MAGNESIUM 40 MG PO CPDR: 40.0000 mg | DELAYED_RELEASE_CAPSULE | Freq: Two times a day (BID) | ORAL | 3 refills | Status: DC

## 2018-07-03 DIAGNOSIS — I2510 Atherosclerotic heart disease of native coronary artery without angina pectoris: Secondary | ICD-10-CM | POA: Diagnosis not present

## 2018-07-03 DIAGNOSIS — S72141A Displaced intertrochanteric fracture of right femur, initial encounter for closed fracture: Secondary | ICD-10-CM

## 2018-07-03 DIAGNOSIS — Z7901 Long term (current) use of anticoagulants: Secondary | ICD-10-CM

## 2018-07-03 DIAGNOSIS — N400 Enlarged prostate without lower urinary tract symptoms: Secondary | ICD-10-CM

## 2018-07-03 DIAGNOSIS — I4891 Unspecified atrial fibrillation: Secondary | ICD-10-CM

## 2018-07-03 HISTORY — PX: FRACTURE SURGERY: SHX138

## 2018-07-04 DIAGNOSIS — I2510 Atherosclerotic heart disease of native coronary artery without angina pectoris: Secondary | ICD-10-CM | POA: Diagnosis not present

## 2018-07-04 DIAGNOSIS — Z7901 Long term (current) use of anticoagulants: Secondary | ICD-10-CM | POA: Diagnosis not present

## 2018-07-04 DIAGNOSIS — I4891 Unspecified atrial fibrillation: Secondary | ICD-10-CM | POA: Diagnosis not present

## 2018-07-04 DIAGNOSIS — S72141A Displaced intertrochanteric fracture of right femur, initial encounter for closed fracture: Secondary | ICD-10-CM | POA: Diagnosis not present

## 2018-07-05 DIAGNOSIS — S72141A Displaced intertrochanteric fracture of right femur, initial encounter for closed fracture: Secondary | ICD-10-CM | POA: Diagnosis not present

## 2018-07-05 DIAGNOSIS — Z7901 Long term (current) use of anticoagulants: Secondary | ICD-10-CM | POA: Diagnosis not present

## 2018-07-05 DIAGNOSIS — I4891 Unspecified atrial fibrillation: Secondary | ICD-10-CM | POA: Diagnosis not present

## 2018-07-05 DIAGNOSIS — I2510 Atherosclerotic heart disease of native coronary artery without angina pectoris: Secondary | ICD-10-CM | POA: Diagnosis not present

## 2018-07-06 DIAGNOSIS — Z7901 Long term (current) use of anticoagulants: Secondary | ICD-10-CM | POA: Diagnosis not present

## 2018-07-06 DIAGNOSIS — I2510 Atherosclerotic heart disease of native coronary artery without angina pectoris: Secondary | ICD-10-CM | POA: Diagnosis not present

## 2018-07-06 DIAGNOSIS — S72141A Displaced intertrochanteric fracture of right femur, initial encounter for closed fracture: Secondary | ICD-10-CM | POA: Diagnosis not present

## 2018-07-06 DIAGNOSIS — I4891 Unspecified atrial fibrillation: Secondary | ICD-10-CM | POA: Diagnosis not present

## 2018-07-07 DIAGNOSIS — I2510 Atherosclerotic heart disease of native coronary artery without angina pectoris: Secondary | ICD-10-CM | POA: Diagnosis not present

## 2018-07-07 DIAGNOSIS — I4891 Unspecified atrial fibrillation: Secondary | ICD-10-CM | POA: Diagnosis not present

## 2018-07-07 DIAGNOSIS — S72141A Displaced intertrochanteric fracture of right femur, initial encounter for closed fracture: Secondary | ICD-10-CM | POA: Diagnosis not present

## 2018-07-07 DIAGNOSIS — Z7901 Long term (current) use of anticoagulants: Secondary | ICD-10-CM | POA: Diagnosis not present

## 2018-07-10 ENCOUNTER — Telehealth: Payer: Self-pay | Admitting: Cardiovascular Disease

## 2018-07-10 NOTE — Telephone Encounter (Signed)
Please advise, if any interactions.  Thank you!

## 2018-07-10 NOTE — Telephone Encounter (Signed)
The interaction will mainly affects hydrocone.  Taking both product means that he should need less hydrocodone.  *Please use your pain medication ONLY when absolutly needed while you recover from your procedure*

## 2018-07-10 NOTE — Telephone Encounter (Signed)
Called and spoke with Elane Fritz, she is aware of message from PharmD.

## 2018-07-10 NOTE — Telephone Encounter (Signed)
  [name] fell last week and broke his hip and he is taking hydrocodone. Lana wanted to let Dr Claiborne Billings know since this would be a medication interaction with him being on Cardizem.

## 2018-07-12 ENCOUNTER — Telehealth: Payer: Self-pay | Admitting: Cardiovascular Disease

## 2018-07-12 ENCOUNTER — Inpatient Hospital Stay (HOSPITAL_COMMUNITY)
Admission: AD | Admit: 2018-07-12 | Discharge: 2018-07-21 | DRG: 981 | Disposition: A | Discharge status: Home Health | Payer: Medicare Other | Source: Other Acute Inpatient Hospital | Attending: Internal Medicine | Admitting: Internal Medicine

## 2018-07-12 DIAGNOSIS — S72141D Displaced intertrochanteric fracture of right femur, subsequent encounter for closed fracture with routine healing: Secondary | ICD-10-CM

## 2018-07-12 DIAGNOSIS — Z833 Family history of diabetes mellitus: Secondary | ICD-10-CM

## 2018-07-12 DIAGNOSIS — N179 Acute kidney failure, unspecified: Secondary | ICD-10-CM | POA: Diagnosis not present

## 2018-07-12 DIAGNOSIS — G8929 Other chronic pain: Secondary | ICD-10-CM | POA: Diagnosis present

## 2018-07-12 DIAGNOSIS — I21A1 Myocardial infarction type 2: Secondary | ICD-10-CM | POA: Diagnosis present

## 2018-07-12 DIAGNOSIS — Z955 Presence of coronary angioplasty implant and graft: Secondary | ICD-10-CM | POA: Diagnosis not present

## 2018-07-12 DIAGNOSIS — R0789 Other chest pain: Secondary | ICD-10-CM | POA: Diagnosis not present

## 2018-07-12 DIAGNOSIS — I129 Hypertensive chronic kidney disease with stage 1 through stage 4 chronic kidney disease, or unspecified chronic kidney disease: Secondary | ICD-10-CM | POA: Diagnosis present

## 2018-07-12 DIAGNOSIS — R64 Cachexia: Secondary | ICD-10-CM | POA: Diagnosis present

## 2018-07-12 DIAGNOSIS — I25119 Atherosclerotic heart disease of native coronary artery with unspecified angina pectoris: Secondary | ICD-10-CM | POA: Diagnosis not present

## 2018-07-12 DIAGNOSIS — R109 Unspecified abdominal pain: Secondary | ICD-10-CM

## 2018-07-12 DIAGNOSIS — R509 Fever, unspecified: Secondary | ICD-10-CM | POA: Diagnosis present

## 2018-07-12 DIAGNOSIS — K559 Vascular disorder of intestine, unspecified: Secondary | ICD-10-CM | POA: Diagnosis not present

## 2018-07-12 DIAGNOSIS — K449 Diaphragmatic hernia without obstruction or gangrene: Secondary | ICD-10-CM | POA: Diagnosis present

## 2018-07-12 DIAGNOSIS — I252 Old myocardial infarction: Secondary | ICD-10-CM

## 2018-07-12 DIAGNOSIS — E785 Hyperlipidemia, unspecified: Secondary | ICD-10-CM | POA: Diagnosis present

## 2018-07-12 DIAGNOSIS — D500 Iron deficiency anemia secondary to blood loss (chronic): Secondary | ICD-10-CM | POA: Diagnosis present

## 2018-07-12 DIAGNOSIS — I708 Atherosclerosis of other arteries: Secondary | ICD-10-CM | POA: Diagnosis present

## 2018-07-12 DIAGNOSIS — Z885 Allergy status to narcotic agent status: Secondary | ICD-10-CM | POA: Diagnosis not present

## 2018-07-12 DIAGNOSIS — K5900 Constipation, unspecified: Secondary | ICD-10-CM | POA: Diagnosis present

## 2018-07-12 DIAGNOSIS — Z7901 Long term (current) use of anticoagulants: Secondary | ICD-10-CM

## 2018-07-12 DIAGNOSIS — I4510 Unspecified right bundle-branch block: Secondary | ICD-10-CM | POA: Diagnosis present

## 2018-07-12 DIAGNOSIS — K279 Peptic ulcer, site unspecified, unspecified as acute or chronic, without hemorrhage or perforation: Secondary | ICD-10-CM | POA: Diagnosis not present

## 2018-07-12 DIAGNOSIS — I739 Peripheral vascular disease, unspecified: Secondary | ICD-10-CM | POA: Diagnosis present

## 2018-07-12 DIAGNOSIS — K219 Gastro-esophageal reflux disease without esophagitis: Secondary | ICD-10-CM | POA: Diagnosis present

## 2018-07-12 DIAGNOSIS — Z951 Presence of aortocoronary bypass graft: Secondary | ICD-10-CM

## 2018-07-12 DIAGNOSIS — D649 Anemia, unspecified: Principal | ICD-10-CM | POA: Diagnosis present

## 2018-07-12 DIAGNOSIS — K551 Chronic vascular disorders of intestine: Secondary | ICD-10-CM

## 2018-07-12 DIAGNOSIS — E876 Hypokalemia: Secondary | ICD-10-CM | POA: Diagnosis not present

## 2018-07-12 DIAGNOSIS — Z20828 Contact with and (suspected) exposure to other viral communicable diseases: Secondary | ICD-10-CM | POA: Diagnosis present

## 2018-07-12 DIAGNOSIS — D631 Anemia in chronic kidney disease: Secondary | ICD-10-CM | POA: Diagnosis present

## 2018-07-12 DIAGNOSIS — R0602 Shortness of breath: Secondary | ICD-10-CM | POA: Diagnosis present

## 2018-07-12 DIAGNOSIS — Z8249 Family history of ischemic heart disease and other diseases of the circulatory system: Secondary | ICD-10-CM

## 2018-07-12 DIAGNOSIS — R1013 Epigastric pain: Secondary | ICD-10-CM | POA: Diagnosis not present

## 2018-07-12 DIAGNOSIS — I4819 Other persistent atrial fibrillation: Secondary | ICD-10-CM | POA: Diagnosis present

## 2018-07-12 DIAGNOSIS — R1310 Dysphagia, unspecified: Secondary | ICD-10-CM | POA: Diagnosis not present

## 2018-07-12 DIAGNOSIS — Y95 Nosocomial condition: Secondary | ICD-10-CM | POA: Diagnosis present

## 2018-07-12 DIAGNOSIS — E871 Hypo-osmolality and hyponatremia: Secondary | ICD-10-CM | POA: Diagnosis present

## 2018-07-12 DIAGNOSIS — M549 Dorsalgia, unspecified: Secondary | ICD-10-CM | POA: Diagnosis present

## 2018-07-12 DIAGNOSIS — Z823 Family history of stroke: Secondary | ICD-10-CM

## 2018-07-12 DIAGNOSIS — I10 Essential (primary) hypertension: Secondary | ICD-10-CM | POA: Diagnosis not present

## 2018-07-12 DIAGNOSIS — I701 Atherosclerosis of renal artery: Secondary | ICD-10-CM | POA: Diagnosis present

## 2018-07-12 DIAGNOSIS — Z882 Allergy status to sulfonamides status: Secondary | ICD-10-CM | POA: Diagnosis not present

## 2018-07-12 DIAGNOSIS — I214 Non-ST elevation (NSTEMI) myocardial infarction: Secondary | ICD-10-CM | POA: Diagnosis present

## 2018-07-12 DIAGNOSIS — Z6821 Body mass index (BMI) 21.0-21.9, adult: Secondary | ICD-10-CM

## 2018-07-12 DIAGNOSIS — I779 Disorder of arteries and arterioles, unspecified: Secondary | ICD-10-CM | POA: Diagnosis present

## 2018-07-12 DIAGNOSIS — I774 Celiac artery compression syndrome: Secondary | ICD-10-CM | POA: Diagnosis present

## 2018-07-12 DIAGNOSIS — Z87891 Personal history of nicotine dependence: Secondary | ICD-10-CM

## 2018-07-12 DIAGNOSIS — Z8719 Personal history of other diseases of the digestive system: Secondary | ICD-10-CM | POA: Diagnosis not present

## 2018-07-12 DIAGNOSIS — N183 Chronic kidney disease, stage 3 (moderate): Secondary | ICD-10-CM | POA: Diagnosis present

## 2018-07-12 DIAGNOSIS — Z8781 Personal history of (healed) traumatic fracture: Secondary | ICD-10-CM | POA: Diagnosis not present

## 2018-07-12 DIAGNOSIS — J181 Lobar pneumonia, unspecified organism: Secondary | ICD-10-CM | POA: Diagnosis present

## 2018-07-12 DIAGNOSIS — I2510 Atherosclerotic heart disease of native coronary artery without angina pectoris: Secondary | ICD-10-CM | POA: Diagnosis present

## 2018-07-12 DIAGNOSIS — I4891 Unspecified atrial fibrillation: Secondary | ICD-10-CM | POA: Diagnosis not present

## 2018-07-12 DIAGNOSIS — R1110 Vomiting, unspecified: Secondary | ICD-10-CM | POA: Diagnosis not present

## 2018-07-12 DIAGNOSIS — I480 Paroxysmal atrial fibrillation: Secondary | ICD-10-CM | POA: Diagnosis present

## 2018-07-12 DIAGNOSIS — I219 Acute myocardial infarction, unspecified: Secondary | ICD-10-CM | POA: Diagnosis not present

## 2018-07-12 DIAGNOSIS — I771 Stricture of artery: Secondary | ICD-10-CM | POA: Diagnosis not present

## 2018-07-12 DIAGNOSIS — Z803 Family history of malignant neoplasm of breast: Secondary | ICD-10-CM

## 2018-07-12 DIAGNOSIS — G2581 Restless legs syndrome: Secondary | ICD-10-CM | POA: Diagnosis present

## 2018-07-12 MED ORDER — SODIUM CHLORIDE 0.9 % IV SOLN
INTRAVENOUS | Status: DC
  Administered 2018-07-13 – 2018-07-14 (×2): via INTRAVENOUS

## 2018-07-12 MED ORDER — POLYETHYLENE GLYCOL 3350 17 G PO PACK
17.0000 g | PACK | Freq: Two times a day (BID) | ORAL | Status: DC
  Administered 2018-07-13 – 2018-07-17 (×5): 17 g via ORAL
  Filled 2018-07-12 (×10): qty 1

## 2018-07-12 MED ORDER — SODIUM CHLORIDE 0.45 % IV SOLN: INTRAVENOUS | Status: DC

## 2018-07-12 MED ORDER — HEPARIN (PORCINE) 25000 UT/250ML-% IV SOLN
1100.0000 [IU]/h | INTRAVENOUS | Status: DC
  Administered 2018-07-12: 23:00:00 750 [IU]/h via INTRAVENOUS
  Administered 2018-07-15: 03:00:00 1200 [IU]/h via INTRAVENOUS
  Administered 2018-07-15: 21:00:00 1100 [IU]/h via INTRAVENOUS
  Filled 2018-07-12 (×4): qty 250

## 2018-07-12 MED ORDER — ACETAMINOPHEN 325 MG PO TABS
650.0000 mg | ORAL_TABLET | Freq: Four times a day (QID) | ORAL | Status: DC | PRN
  Administered 2018-07-13 – 2018-07-16 (×4): 650 mg via ORAL
  Filled 2018-07-12 (×4): qty 2

## 2018-07-12 MED ORDER — ONDANSETRON HCL 4 MG PO TABS: 4.0000 mg | ORAL_TABLET | Freq: Four times a day (QID) | ORAL | Status: DC | PRN

## 2018-07-12 MED ORDER — ONDANSETRON HCL 4 MG/2ML IJ SOLN
4.0000 mg | Freq: Four times a day (QID) | INTRAMUSCULAR | Status: DC | PRN
  Administered 2018-07-16 – 2018-07-19 (×5): 4 mg via INTRAVENOUS
  Filled 2018-07-12 (×5): qty 2

## 2018-07-12 NOTE — Telephone Encounter (Signed)
Think this is meant for you

## 2018-07-12 NOTE — Progress Notes (Signed)
[facility] Transfer Accept Note  Reason for transfer: Fever, NSTEMI  [name] is a 73 y.o. M with hx CAD s/p CABG in 1984, redo CABG in 1996, and L main PCI in 1998, no PCI last 12 months follows with Dr. Claiborne Billings, PVD s/p CEA in 1996, renal artery stenting in 2005, and AF on Eliquis who fell 10 days ago, had R hip fracture, repaired at [facility], discharged on 5/2 to home who now presents with fever, cough, found to have incidental NSTEMI.   Since being home, has been doing okay. Then in last 24 hours, developed new fever to 100.76F, tachycardia, malaise. Today, fever persisted and he became too weak to stand, so called 9-1-1.   In ER, temp 99.76F, HR 127 in Afib. BP 119/56, SpO2 normal on RA. Na 127, Cr 1.1 (at baseline), WBC 19.7K. CXR without pneumonia. Dimer was elevated, but CTA chest showed no PE or pneumonia either for that matter. UA unremarkable, no urine symptoms. Right hip incision looks fine, no hip pain to suggest post-op infection.   Blood and urine cultures were obtained and he was started on empiric azithromycin and ceftriaxone given cough as only localizing symptoms.   Of note, troponin 2.7, no ECG changes, no chest pain. EDP in [place] discussed with Cardiology here at [facility] (Dr. Oval Linsey) who recommended heparin gtt, transfer to [facility] to medicine service given chief complaint fever, and consult Cardiology on arrival.

## 2018-07-12 NOTE — Telephone Encounter (Addendum)
Spoke with patients wife and patient had surgery last week for hip fracture. Since then he has not been sleeping well. Per wife he developed a fever and she thinks he is dehydrated. He is weak and having hard time staying awake. This am temp 101.2, did vomit x 1 this am and HR since yesterday running 140's-150's.  had Had her check now and HR 158. Urine dark yesterday. Advised wife patient needs to go to ED for evaluation, verbalized understanding

## 2018-07-12 NOTE — H&P (Signed)
History and Physical   CASE VASSELL VQM:[id number] DOB: [birth year] DOA: 07/12/2018  Referring MD/NP/PA: Dr. Sabra Heck at [facility]  PCP: Garwin Brothers, MD   Outpatient Specialists: Dr. Shelva Majestic cardiologist  Patient coming from: [facility]  Chief Complaint: Fever cough and shortness of breath  HPI: [name] is a 73 y.o. male with medical history significant of coronary artery disease status post coronary artery bypass graft x2 in 1984 and 1996 respectively, subsequent MI with cardiac cath in 1998 as well as 2015.  Last cath was in 2012., A. fib, peripheral vascular disease, bilateral carotid stenosis with endarterectomy done in 1996, renal artery stenosis with renal artery stenting in 2005 degenerative disc disease, GERD who presented to [facility] emergency room with complaint of fever, weakness cough and congestion for 2 days.  At the time he had low-grade temperature 99.9 which went on to 101.  He did have a hip repair 5 days earlier over at [facility].  The area has been healing well apparently.  Not having any pain or symptoms.  In the ER there he was checked and evaluated and found to have elevated troponin of 2.7.  Also ongoing high temperature.  Patient is on Eliquis for atrial fibrillation when he had a fall 10 days ago he had right hip fracture that was repaired.  He has done well until last night when the symptoms started.  Patient is so weak that he is unable to stand.  He came to the ER for the reason.  He still complained of some abdominal pain here.  He has not had a bowel movement in about 5 days.  Patient also reported some diaphoresis.  No significant chest pain..  ED Course: Temperature then was 99 9 with a blood pressure of 190/56 pulse 127 respiratory rate of 80.  Here temperature has jumped 103 with a heart rate of 170 mostly sinus tachycardia.  Labs at [facility] showed a sodium of 127 potassium 4.0 chloride 93 CO2 27 BUN 30 creatinine 1.10 glucose  130 White count is 19.7 hemoglobin 9.1 and platelets 233.  Troponin is 2.70 alkaline phosphatase 7 9 C-reactive protein is 165.  D-dimer 4501.  COVID-19 testing was negative.  CT angiogram of the chest showed no PE but areas of mild mucus impaction in the right lower lobe.  Also new severe stenosis of the SMA origin cardiology was consulted and recommended medical admission due to the fever.  Suspected non-ST elevation MI therefore to be followed of here.  Patient started on heparin and is sent over to [facility].  Review of Systems: As per HPI otherwise 10 point review of systems negative.    Past Medical History:  Diagnosis Date  . Anemia   . Angina   . Atrial fibrillation ([place])   . Blood transfusion   . Bronchitis   . Bulging discs    "8 of them; thoracic, lumbar, sacral area"  . Chronic back pain   . Colon polyps   . Complication of anesthesia   . Coronary artery disease 01/24/11   Successful PCI long segmental stensois  vein graft to the CX marginal vessel-graft previously stented prox. 3.0x20mm TAXUS stent mid seg 3.0x80mm TAXUS stent now tandem stens of 3.0x7mm & 3.0x38mm placed with the seg. 50,80 & 99% stenosis reduced to 0%  . DDD (degenerative disc disease)   . Dysrhythmia    "PAC's and PVC's"  . Esophageal dilatation    "have had it done 7 times; last  time ~ 2001"  . Esophageal stricture   . GERD (gastroesophageal reflux disease)   . Headache(784.0)   . Hiatal hernia   . Hyperlipidemia   . Myocardial infarction ([place]) 1991  . Myocardial infarction [facility]) 2005   "had 3 heart attacks this year"  . Paroxysmal atrial fibrillation (HCC)   . Pyloric stenosis   . RBBB   . Renal artery stenosis (HCC)    hhistory of right renal artery stenting and re-intervention for "in-stent restenosis.  . S/P CABG (coronary artery bypass graft) [place]  . Shortness of breath    "when I had the heart attacks"  . Subclavian artery stenosis, left ([place])    . Ulcer    "small; Dr. Henrene Pastor found it 02/2010"    Past Surgical History:  Procedure Laterality Date  . ADENOIDECTOMY     "when I was an infant"  . APPENDECTOMY  1953  . CARDIOVERSION N/A 04/17/2017   Procedure: CARDIOVERSION;  Surgeon: Jerline Pain, MD;  Location: [facility] ENDOSCOPY;  Service: Cardiovascular;  Laterality: N/A;  . CAROTID ENDARTERECTOMY Right [place]  01/24/11   "today makes a total of 9 stents"  . CORONARY ARTERY BYPASS GRAFT  1984   CABG X 4  . CORONARY ARTERY BYPASS GRAFT  1996   CABG X 4  . LEFT HEART CATHETERIZATION WITH CORONARY/GRAFT ANGIOGRAM N/A 01/24/2011   Procedure: LEFT HEART CATHETERIZATION WITH Beatrix Fetters;  Surgeon: Troy Sine, MD;  Location: [facility] C[facility] LAB;  Service: Cardiovascular;  Laterality: N/A;  . pylondial cyst removal    . RETINAL DETACHMENT SURGERY  04/2007   right  . TONSILLECTOMY  1972     reports that he quit smoking about 13 years ago. His smoking use included cigarettes. He has a 38.00 pack-year smoking history. He has never used smokeless tobacco. He reports that he does not drink alcohol or use drugs.  Allergies  Allergen Reactions  . Codeine Nausea And Vomiting  . Sulfa Antibiotics Rash  . Sulfonamide Derivatives Rash    Family History  Problem Relation Age of Onset  . Breast cancer Paternal Aunt   . Diabetes Sister   . Ulcers Father   . Heart disease Father   . Heart failure Father   . Heart attack Father   . AAA (abdominal aortic aneurysm) Father   . Stroke Mother   . Hypertension Mother   . Colon cancer Neg Hx      Prior to Admission medications   Medication Sig Start Date End Date Taking? Authorizing Provider  butalbital-aspirin-caffeine [facility]) 50-325-40 MG per capsule Take 1 capsule by mouth every 4 (four) hours as needed for headache.     [provider]  diltiazem (CARDIZEM) 30 MG tablet Take one tablet by mouth every 4 hours AS NEEDED  for A-fib HR > 100 as long as BP > 100. 06/30/17   Sherran Needs, NP  dutasteride (AVODART) 0.5 MG capsule Take 0.5 mg by mouth daily.  03/22/14   [provider]  ELIQUIS 5 MG TABS tablet TAKE 1 TABLET BY MOUTH TWICE A DAY 05/14/18   Camnitz, Ocie Doyne, MD  esomeprazole (NEXIUM) 40 MG capsule Take 1 capsule (40 mg total) by mouth 2 (two) times daily. 06/11/18   Irene Shipper, MD  ezetimibe (ZETIA) 10 MG tablet TAKE 1 TABLET (10 MG TOTAL) BY MOUTH DAILY. 03/05/18   Shelva Majestic  A, MD  Ketotifen Fumarate (ALAWAY OP) Place 1 drop into the right eye daily.    [provider]  loratadine (CLARITIN) 10 MG tablet Take 10 mg by mouth daily as needed for allergies, rhinitis or itching.  12/12/13   [provider]  lovastatin (MEVACOR) 40 MG tablet Take 20 mg by mouth at bedtime.    [provider]  metoprolol tartrate (LOPRESSOR) 50 MG tablet Take 1.5 tablets (75 mg total) by mouth 2 (two) times daily. 03/21/18 06/19/18  Camnitz, Ocie Doyne, MD  Multiple Vitamins-Minerals (MULTIVITAMINS THER. W/MINERALS) TABS Take 1 tablet by mouth daily.      [provider]  nitroGLYCERIN (NITROLINGUAL) 0.4 MG/SPRAY spray Place 1 spray under the tongue every 5 (five) minutes x 3 doses as needed for chest pain. 01/01/18   [name], MD  ranolazine (RANEXA) 1000 MG SR tablet Take 500 mg by mouth 2 (two) times daily.    [provider]  torsemide (DEMADEX) 20 MG tablet Take 20 mg by mouth as needed (for fluid). FOR SWELLING OR WEIGHT GAIN    [provider]  vitamin E 400 UNIT capsule Take 400 Units by mouth daily.     [provider]    Physical Exam: Vitals:   07/12/18 2054  BP: (!) 136/92  Pulse: (!) 142  Temp: (!) 103 F (39.4 C)  TempSrc: Oral  SpO2: 100%  Weight: 58.4 kg  Height: 5\' 5"  (1.651 m)      Constitutional: Chronically ill looking slightly cachectic, in mild distress Vitals:   07/12/18 2054  BP: (!) 136/92  Pulse:  (!) 142  Temp: (!) 103 F (39.4 C)  TempSrc: Oral  SpO2: 100%  Weight: 58.4 kg  Height: 5\' 5"  (1.651 m)   Eyes: PERRL, lids and conjunctivae normal ENMT: Mucous membranes are moist. Posterior pharynx clear of any exudate or lesions.Normal dentition.  Neck: normal, supple, no masses, no thyromegaly Respiratory: clear to auscultation bilaterally, no wheezing, bilateral crackles. Normal respiratory effort. No accessory muscle use.  Cardiovascular: Irregularly irregular with tachycardia no murmurs / rubs / gallops.  Trace extremity edema. 2+ pedal pulses. No carotid bruits.  Abdomen: Mild suprapubic tenderness, no masses palpated. No hepatosplenomegaly. Bowel sounds positive.  Musculoskeletal: no clubbing / cyanosis. No joint deformity upper and lower extremities. Good ROM, no contractures. Normal muscle tone.  Skin: no rashes, lesions, ulcers. No induration Neurologic: CN 2-12 grossly intact. Sensation intact, DTR normal. Strength 5/5 in all 4.  Psychiatric: Normal judgment and insight. Alert and oriented x 3. Normal mood.     Labs on Admission: I have personally reviewed following labs and imaging studies  CBC: No results for input(s): WBC, NEUTROABS, HGB, HCT, MCV, PLT in the last 168 hours. Basic Metabolic Panel: No results for input(s): NA, K, CL, CO2, GLUCOSE, BUN, CREATININE, CALCIUM, MG, PHOS in the last 168 hours. GFR: CrCl cannot be calculated (Patient's most recent lab result is older than the maximum 21 days allowed.). Liver Function Tests: No results for input(s): AST, ALT, ALKPHOS, BILITOT, PROT, ALBUMIN in the last 168 hours. No results for input(s): LIPASE, AMYLASE in the last 168 hours. No results for input(s): AMMONIA in the last 168 hours. Coagulation Profile: No results for input(s): INR, PROTIME in the last 168 hours. Cardiac Enzymes: No results for input(s): CKTOTAL, CKMB, CKMBINDEX, TROPONINI in the last 168 hours. BNP (last 3 results) No results for input(s):  PROBNP in the last 8760 hours. HbA1C: No results for input(s): HGBA1C  in the last 72 hours. CBG: No results for input(s): GLUCAP in the last 168 hours. Lipid Profile: No results for input(s): CHOL, HDL, LDLCALC, TRIG, CHOLHDL, LDLDIRECT in the last 72 hours. Thyroid Function Tests: No results for input(s): TSH, T4TOTAL, FREET4, T3FREE, THYROIDAB in the last 72 hours. Anemia Panel: No results for input(s): VITAMINB12, FOLATE, FERRITIN, TIBC, IRON, RETICCTPCT in the last 72 hours. Urine analysis:    Component Value Date/Time   COLORURINE DARK YELLOW 11/25/2014 1027   APPEARANCEUR CLEAR 11/25/2014 1027   LABSPEC 1.023 11/25/2014 1027   PHURINE 5.5 11/25/2014 1027   GLUCOSEU NEGATIVE 11/25/2014 1027   HGBUR NEGATIVE 11/25/2014 1027   BILIRUBINUR NEGATIVE 11/25/2014 1027   KETONESUR NEGATIVE 11/25/2014 1027   PROTEINUR NEGATIVE 11/25/2014 1027   NITRITE NEGATIVE 11/25/2014 1027   LEUKOCYTESUR NEGATIVE 11/25/2014 1027   Sepsis Labs: @LABRCNTIP (procalcitonin:4,lacticidven:4) )No results found for this or any previous visit (from the past 240 hour(s)).   Radiological Exams on Admission: No results found.  EKG: Independently reviewed.  Initial EKG showed A. fib with RVR with a rate of 165.  No significant ST changes.  Assessment/Plan Principal Problem:   NSTEMI (non-ST elevated myocardial infarction) [facility]) Active Problems:   Essential hypertension   Hx of CABG '84, '96, last PCI 2012   GASTROESOPHAGEAL REFLUX DISEASE   PVD (peripheral vascular disease) ([place])   Carotid disease, bilateral ([place])   Hyperlipidemia LDL goal <70   Paroxysmal atrial fibrillation (HCC)   Coronary artery disease   Fever   Lobar pneumonia ([place])     #1 non-ST elevation MI: In the setting of known coronary artery disease.  Patient currently on IV heparin.  We will resume his home regimen.  Cardiology consulted.  Beta-blockers will be continued with as well as ACEI's.  Morphine and oxygen as needed.   Nitroglycerin as needed.  #2 lobar pneumonia: Based on CT findings.  I will start patient on IV antibiotics he was in the hospital recently however so this could be healthcare associated pneumonia.  Obtain blood cultures and monitor.  #3 constipation: Initiate laxatives.  #4 paroxysmal atrial fibrillation with RVR: Patient currently on heparin drip.  Holding his Eliquis.  Rate control possibly with beta-blockers.  Defer decision to cardiology.  #5 GERD: Continue with PPIs.  #6 peripheral vascular disease: Status post CEA.  Continue home regimen.  #7 essential hypertension: Continue blood pressure control.  Blood pressure slightly elevated as of this night.  #8 hyperlipidemia: Continue with statins.  #9 chronic back pain: Continue home regimen of pain control medications.   DVT prophylaxis: Heparin drip Code Status: Full code Family Communication: Discussed care with the patient fully Disposition Plan: Home Consults called: Cardiology Admission status: Inpatient  Severity of Illness: The appropriate patient status for this patient is INPATIENT. Inpatient status is judged to be reasonable and necessary in order to provide the required intensity of service to ensure the patient's safety. The patient's presenting symptoms, physical exam findings, and initial radiographic and laboratory data in the context of their chronic comorbidities is felt to place them at high risk for further clinical deterioration. Furthermore, it is not anticipated that the patient will be medically stable for discharge from the hospital within 2 midnights of admission. The following factors support the patient status of inpatient.   " The patient's presenting symptoms include fever cough and weakness. " The worrisome physical exam findings include A. fib with RVR and temp of 103. " The initial radiographic and laboratory data are worrisome because  of CT chest finding of possible pneumonia. " The chronic  co-morbidities include A. fib with coronary artery disease.   * I certify that at the point of admission it is my clinical judgment that the patient will require inpatient hospital care spanning beyond 2 midnights from the point of admission due to high intensity of service, high risk for further deterioration and high frequency of surveillance required.Barbette Merino MD [facility] Pager [phone]  If 7PM-7AM, please contact night-coverage [email or web address] Password [facility]  07/12/2018, 11:26 PM

## 2018-07-12 NOTE — Telephone Encounter (Signed)
Noted. Thanks.

## 2018-07-12 NOTE — Progress Notes (Signed)
ANTICOAGULATION CONSULT NOTE - Initial Consult  Pharmacy Consult for heparin Indication: chest pain/ACS  Allergies  Allergen Reactions  . Codeine Nausea And Vomiting  . Sulfa Antibiotics Rash  . Sulfonamide Derivatives Rash    Patient Measurements: Height: 5\' 5"  (165.1 cm) Weight: 128 lb 12 oz (58.4 kg) IBW/kg (Calculated) : 61.5 Heparin Dosing Weight: 58kg  Vital Signs: Temp: 103 F (39.4 C) (05/07 2054) Temp Source: Oral (05/07 2054) BP: 136/92 (05/07 2054) Pulse Rate: 142 (05/07 2054)  Labs: No results for input(s): HGB, HCT, PLT, APTT, LABPROT, INR, HEPARINUNFRC, HEPRLOWMOCWT, CREATININE, CKTOTAL, CKMB, TROPONINI in the last 72 hours.  CrCl cannot be calculated (Patient's most recent lab result is older than the maximum 21 days allowed.).   Medical History: Past Medical History:  Diagnosis Date  . Anemia   . Angina   . Atrial fibrillation ([place])   . Blood transfusion   . Bronchitis   . Bulging discs    "8 of them; thoracic, lumbar, sacral area"  . Chronic back pain   . Colon polyps   . Complication of anesthesia   . Coronary artery disease 01/24/11   Successful PCI long segmental stensois  vein graft to the CX marginal vessel-graft previously stented prox. 3.0x26mm TAXUS stent mid seg 3.0x50mm TAXUS stent now tandem stens of 3.0x20mm & 3.0x57mm placed with the seg. 50,80 & 99% stenosis reduced to 0%  . DDD (degenerative disc disease)   . Dysrhythmia    "PAC's and PVC's"  . Esophageal dilatation    "have had it done 7 times; last time ~ 2001"  . Esophageal stricture   . GERD (gastroesophageal reflux disease)   . Headache(784.0)   . Hiatal hernia   . Hyperlipidemia   . Myocardial infarction ([place]) 1991  . Myocardial infarction [facility]) 2005   "had 3 heart attacks this year"  . Paroxysmal atrial fibrillation (HCC)   . Pyloric stenosis   . RBBB   . Renal artery stenosis (HCC)    hhistory of right renal artery stenting and re-intervention for "in-stent  restenosis.  . S/P CABG (coronary artery bypass graft) [place]  . Shortness of breath    "when I had the heart attacks"  . Subclavian artery stenosis, left ([place])   . Ulcer    "small; Dr. Henrene Pastor found it 02/2010"     Assessment: 59 yoM with hx CAD admitted with fever and found to have elevated troponins. Pt started on heparin 750 units/hr prior to transfer to [facility] from OSH ED ~2030 per RN report. Of note, pt also has hx of AFib and had been on Eliquis with last dose at some point yesterday.  Goal of Therapy:  Heparin level 0.3-0.7 units/ml aPTT 66-102 seconds Monitor platelets by anticoagulation protocol: Yes   Plan:  -Continue heparin 750 units/hr -Will check 8hr aPTT and heparin level from start  Arrie Senate, PharmD, BCPS Clinical Pharmacist [phone] Please check AMION for all [place] numbers 07/12/2018

## 2018-07-12 NOTE — Telephone Encounter (Signed)
New Message    Pts wife is calling and would like a call back from the nurse    Please call

## 2018-07-13 ENCOUNTER — Other Ambulatory Visit: Payer: Self-pay

## 2018-07-13 ENCOUNTER — Inpatient Hospital Stay (HOSPITAL_COMMUNITY): Payer: Medicare Other

## 2018-07-13 DIAGNOSIS — I4819 Other persistent atrial fibrillation: Secondary | ICD-10-CM

## 2018-07-13 DIAGNOSIS — J181 Lobar pneumonia, unspecified organism: Secondary | ICD-10-CM

## 2018-07-13 DIAGNOSIS — I25119 Atherosclerotic heart disease of native coronary artery with unspecified angina pectoris: Secondary | ICD-10-CM

## 2018-07-13 DIAGNOSIS — I214 Non-ST elevation (NSTEMI) myocardial infarction: Secondary | ICD-10-CM

## 2018-07-13 DIAGNOSIS — I10 Essential (primary) hypertension: Secondary | ICD-10-CM

## 2018-07-13 DIAGNOSIS — R509 Fever, unspecified: Secondary | ICD-10-CM

## 2018-07-13 DIAGNOSIS — I219 Acute myocardial infarction, unspecified: Secondary | ICD-10-CM

## 2018-07-13 DIAGNOSIS — I4891 Unspecified atrial fibrillation: Secondary | ICD-10-CM

## 2018-07-13 DIAGNOSIS — K551 Chronic vascular disorders of intestine: Secondary | ICD-10-CM

## 2018-07-13 LAB — COMPREHENSIVE METABOLIC PANEL
ALT: 14 U/L (ref 0–44)
ALT: 17 U/L (ref 0–44)
AST: 31 U/L (ref 15–41)
AST: 44 U/L — ABNORMAL HIGH (ref 15–41)
Albumin: 2.7 g/dL — ABNORMAL LOW (ref 3.5–5.0)
Albumin: 2.8 g/dL — ABNORMAL LOW (ref 3.5–5.0)
Alkaline Phosphatase: 56 U/L (ref 38–126)
Alkaline Phosphatase: 60 U/L (ref 38–126)
Anion gap: 10 (ref 5–15)
Anion gap: 14 (ref 5–15)
BUN: 24 mg/dL — ABNORMAL HIGH (ref 8–23)
BUN: 28 mg/dL — ABNORMAL HIGH (ref 8–23)
CO2: 20 mmol/L — ABNORMAL LOW (ref 22–32)
CO2: 21 mmol/L — ABNORMAL LOW (ref 22–32)
Calcium: 7.9 mg/dL — ABNORMAL LOW (ref 8.9–10.3)
Calcium: 8.6 mg/dL — ABNORMAL LOW (ref 8.9–10.3)
Chloride: 100 mmol/L (ref 98–111)
Chloride: 93 mmol/L — ABNORMAL LOW (ref 98–111)
Creatinine, Ser: 0.99 mg/dL (ref 0.61–1.24)
Creatinine, Ser: 1.2 mg/dL (ref 0.61–1.24)
GFR calc Af Amer: >60 mL/min (ref >60)
GFR calc Af Amer: >60 mL/min (ref >60)
GFR calc non Af Amer: 59 mL/min — ABNORMAL LOW (ref >60)
GFR calc non Af Amer: >60 mL/min (ref >60)
Glucose, Bld: 123 mg/dL — ABNORMAL HIGH (ref 70–99)
Glucose, Bld: 158 mg/dL — ABNORMAL HIGH (ref 70–99)
Potassium: 3.5 mmol/L (ref 3.5–5.1)
Potassium: 4 mmol/L (ref 3.5–5.1)
Sodium: 128 mmol/L — ABNORMAL LOW (ref 135–145)
Sodium: 130 mmol/L — ABNORMAL LOW (ref 135–145)
Total Bilirubin: 0.8 mg/dL (ref 0.3–1.2)
Total Bilirubin: 0.9 mg/dL (ref 0.3–1.2)
Total Protein: 5.4 g/dL — ABNORMAL LOW (ref 6.5–8.1)
Total Protein: 5.7 g/dL — ABNORMAL LOW (ref 6.5–8.1)

## 2018-07-13 LAB — APTT
aPTT: 50 seconds — ABNORMAL HIGH (ref 24–36)
aPTT: 64 seconds — ABNORMAL HIGH (ref 24–36)
aPTT: 67 seconds — ABNORMAL HIGH (ref 24–36)

## 2018-07-13 LAB — CBC WITH DIFFERENTIAL/PLATELET
Abs Immature Granulocytes: 0.33 10*3/uL — ABNORMAL HIGH (ref 0.00–0.07)
Basophils Absolute: 0 10*3/uL (ref 0.0–0.1)
Basophils Relative: 0 %
Eosinophils Absolute: 0 10*3/uL (ref 0.0–0.5)
Eosinophils Relative: 0 %
HCT: 23.1 % — ABNORMAL LOW (ref 39.0–52.0)
Hemoglobin: 7.9 g/dL — ABNORMAL LOW (ref 13.0–17.0)
Immature Granulocytes: 2 %
Lymphocytes Relative: 2 %
Lymphs Abs: 0.4 10*3/uL — ABNORMAL LOW (ref 0.7–4.0)
MCH: 32.9 pg (ref 26.0–34.0)
MCHC: 34.2 g/dL (ref 30.0–36.0)
MCV: 96.3 fL (ref 80.0–100.0)
Monocytes Absolute: 1.4 10*3/uL — ABNORMAL HIGH (ref 0.1–1.0)
Monocytes Relative: 7 %
Neutro Abs: 17.3 10*3/uL — ABNORMAL HIGH (ref 1.7–7.7)
Neutrophils Relative %: 89 %
Platelets: 222 10*3/uL (ref 150–400)
RBC: 2.4 MIL/uL — ABNORMAL LOW (ref 4.22–5.81)
RDW: 15.1 % (ref 11.5–15.5)
WBC: 19.4 10*3/uL — ABNORMAL HIGH (ref 4.0–10.5)
nRBC: 0 % (ref 0.0–0.2)

## 2018-07-13 LAB — C-REACTIVE PROTEIN: CRP: 29.2 mg/dL — ABNORMAL HIGH (ref <1.0)

## 2018-07-13 LAB — CBC
HCT: 25.3 % — ABNORMAL LOW (ref 39.0–52.0)
Hemoglobin: 8.7 g/dL — ABNORMAL LOW (ref 13.0–17.0)
MCH: 33.6 pg (ref 26.0–34.0)
MCHC: 34.4 g/dL (ref 30.0–36.0)
MCV: 97.7 fL (ref 80.0–100.0)
Platelets: 236 10*3/uL (ref 150–400)
RBC: 2.59 MIL/uL — ABNORMAL LOW (ref 4.22–5.81)
RDW: 15.4 % (ref 11.5–15.5)
WBC: 23.2 10*3/uL — ABNORMAL HIGH (ref 4.0–10.5)
nRBC: 0 % (ref 0.0–0.2)

## 2018-07-13 LAB — HEPARIN LEVEL (UNFRACTIONATED): Heparin Unfractionated: >2.2 IU/mL — ABNORMAL HIGH (ref 0.30–0.70)

## 2018-07-13 LAB — LACTIC ACID, PLASMA
Lactic Acid, Venous: 2.3 mmol/L (ref 0.5–1.9)
Lactic Acid, Venous: 1.6 mmol/L (ref 0.5–1.9)
Lactic Acid, Venous: 1.8 mmol/L (ref 0.5–1.9)

## 2018-07-13 LAB — TROPONIN I
Troponin I: 5.16 ng/mL (ref <0.03)
Troponin I: 4.9 ng/mL (ref <0.03)
Troponin I: 4 ng/mL (ref <0.03)

## 2018-07-13 LAB — LACTATE DEHYDROGENASE: LDH: 231 U/L — ABNORMAL HIGH (ref 98–192)

## 2018-07-13 LAB — SARS CORONAVIRUS 2 BY RT PCR (HOSPITAL ORDER, PERFORMED IN ~~LOC~~ HOSPITAL LAB): SARS Coronavirus 2: NEGATIVE

## 2018-07-13 LAB — PROCALCITONIN: Procalcitonin: 0.78 ng/mL

## 2018-07-13 LAB — TSH: TSH: 0.388 u[IU]/mL (ref 0.350–4.500)

## 2018-07-13 MED ORDER — SENNOSIDES-DOCUSATE SODIUM 8.6-50 MG PO TABS
1.0000 | ORAL_TABLET | Freq: Two times a day (BID) | ORAL | Status: DC
  Administered 2018-07-13 – 2018-07-17 (×6): 1 via ORAL
  Filled 2018-07-13 (×11): qty 1

## 2018-07-13 MED ORDER — NITROGLYCERIN 0.4 MG SL SUBL: 0.4000 mg | SUBLINGUAL_TABLET | SUBLINGUAL | Status: DC | PRN

## 2018-07-13 MED ORDER — PRAVASTATIN SODIUM 40 MG PO TABS
40.0000 mg | ORAL_TABLET | Freq: Every day | ORAL | Status: DC
  Administered 2018-07-13 – 2018-07-20 (×8): 40 mg via ORAL
  Filled 2018-07-13 (×8): qty 1

## 2018-07-13 MED ORDER — VANCOMYCIN HCL IN DEXTROSE 1-5 GM/200ML-% IV SOLN
1000.0000 mg | INTRAVENOUS | Status: DC
  Administered 2018-07-13 (×2): 1000 mg via INTRAVENOUS
  Filled 2018-07-13 (×2): qty 200

## 2018-07-13 MED ORDER — TORSEMIDE 20 MG PO TABS
20.0000 mg | ORAL_TABLET | Freq: Every day | ORAL | Status: DC | PRN
  Administered 2018-07-13: 22:00:00 20 mg via ORAL
  Filled 2018-07-13: qty 1

## 2018-07-13 MED ORDER — DILTIAZEM HCL 60 MG PO TABS
60.0000 mg | ORAL_TABLET | Freq: Four times a day (QID) | ORAL | Status: DC
  Administered 2018-07-13 – 2018-07-16 (×12): 60 mg via ORAL
  Filled 2018-07-13 (×14): qty 1

## 2018-07-13 MED ORDER — LEVOFLOXACIN IN D5W 750 MG/150ML IV SOLN: 750.0000 mg | INTRAVENOUS | Status: DC

## 2018-07-13 MED ORDER — RANOLAZINE ER 500 MG PO TB12
500.0000 mg | ORAL_TABLET | Freq: Two times a day (BID) | ORAL | Status: DC
  Administered 2018-07-13 – 2018-07-21 (×16): 500 mg via ORAL
  Filled 2018-07-13 (×17): qty 1

## 2018-07-13 MED ORDER — BISACODYL 10 MG RE SUPP
10.0000 mg | Freq: Every day | RECTAL | Status: DC | PRN
  Filled 2018-07-13: qty 1

## 2018-07-13 MED ORDER — LORATADINE 10 MG PO TABS
10.0000 mg | ORAL_TABLET | Freq: Every day | ORAL | Status: DC | PRN
  Administered 2018-07-13: 22:00:00 10 mg via ORAL
  Filled 2018-07-13: qty 1

## 2018-07-13 MED ORDER — EZETIMIBE 10 MG PO TABS
10.0000 mg | ORAL_TABLET | Freq: Every day | ORAL | Status: DC
  Administered 2018-07-13 – 2018-07-21 (×8): 10 mg via ORAL
  Filled 2018-07-13 (×8): qty 1

## 2018-07-13 MED ORDER — FLEET ENEMA 7-19 GM/118ML RE ENEM
1.0000 | ENEMA | Freq: Every day | RECTAL | Status: DC | PRN
  Filled 2018-07-13: qty 1

## 2018-07-13 MED ORDER — PANTOPRAZOLE SODIUM 40 MG PO TBEC
40.0000 mg | DELAYED_RELEASE_TABLET | Freq: Every day | ORAL | Status: DC
  Administered 2018-07-13 – 2018-07-19 (×6): 40 mg via ORAL
  Filled 2018-07-13 (×6): qty 1

## 2018-07-13 MED ORDER — METHOCARBAMOL 500 MG PO TABS
500.0000 mg | ORAL_TABLET | Freq: Four times a day (QID) | ORAL | Status: DC | PRN
  Administered 2018-07-13 – 2018-07-16 (×6): 500 mg via ORAL
  Filled 2018-07-13 (×6): qty 1

## 2018-07-13 MED ORDER — DUTASTERIDE 0.5 MG PO CAPS
0.5000 mg | ORAL_CAPSULE | Freq: Every day | ORAL | Status: DC
  Administered 2018-07-13 – 2018-07-21 (×8): 0.5 mg via ORAL
  Filled 2018-07-13 (×9): qty 1

## 2018-07-13 MED ORDER — IOHEXOL 350 MG/ML SOLN
100.0000 mL | Freq: Once | INTRAVENOUS | Status: AC | PRN
  Administered 2018-07-13: 100 mL via INTRAVENOUS

## 2018-07-13 MED ORDER — SODIUM CHLORIDE 0.9 % IV SOLN
2.0000 g | Freq: Two times a day (BID) | INTRAVENOUS | Status: DC
  Administered 2018-07-13 – 2018-07-16 (×8): 2 g via INTRAVENOUS
  Filled 2018-07-13 (×11): qty 2

## 2018-07-13 MED ORDER — METOPROLOL TARTRATE 50 MG PO TABS
75.0000 mg | ORAL_TABLET | Freq: Two times a day (BID) | ORAL | Status: DC
  Administered 2018-07-13 – 2018-07-15 (×7): 75 mg via ORAL
  Filled 2018-07-13 (×7): qty 1

## 2018-07-13 NOTE — Telephone Encounter (Signed)
Patient was admitted to hospital

## 2018-07-13 NOTE — Progress Notes (Signed)
Dr. Starla Link text paged via AMION on Lactic Acid level at 2.3.

## 2018-07-13 NOTE — Progress Notes (Signed)
Dr. Starla Link text paged via [place]. Pt troponin level at 5.16.

## 2018-07-13 NOTE — Progress Notes (Signed)
[place] for Heparin Indication: chest pain/ACS  Allergies  Allergen Reactions  . Codeine Nausea And Vomiting  . Sulfa Antibiotics Rash  . Sulfonamide Derivatives Rash    Patient Measurements: Height: 5\' 5"  (165.1 cm) Weight: 128 lb 12 oz (58.4 kg) IBW/kg (Calculated) : 61.5 Heparin Dosing Weight: 58kg  Vital Signs: Temp: 98 F (36.7 C) (05/08 0452) Temp Source: Oral (05/08 0452) BP: 132/83 (05/08 0452) Pulse Rate: 156 (05/07 2358)  Labs: Recent Labs    07/12/18 2336 07/13/18 0354  HGB 7.9* 8.7*  HCT 23.1* 25.3*  PLT 222 236  APTT  --  50*  HEPARINUNFRC  --  >2.20*  CREATININE 0.99 1.20    Estimated Creatinine Clearance: 44.6 mL/min (by C-G formula based on SCr of 1.2 mg/dL).   Medical History: Past Medical History:  Diagnosis Date  . Anemia   . Angina   . Atrial fibrillation ([place])   . Blood transfusion   . Bronchitis   . Bulging discs    "8 of them; thoracic, lumbar, sacral area"  . Chronic back pain   . Colon polyps   . Complication of anesthesia   . Coronary artery disease 01/24/11   Successful PCI long segmental stensois  vein graft to the CX marginal vessel-graft previously stented prox. 3.0x90mm TAXUS stent mid seg 3.0x43mm TAXUS stent now tandem stens of 3.0x61mm & 3.0x61mm placed with the seg. 50,80 & 99% stenosis reduced to 0%  . DDD (degenerative disc disease)   . Dysrhythmia    "PAC's and PVC's"  . Esophageal dilatation    "have had it done 7 times; last time ~ 2001"  . Esophageal stricture   . GERD (gastroesophageal reflux disease)   . Headache(784.0)   . Hiatal hernia   . Hyperlipidemia   . Myocardial infarction ([place]) 1991  . Myocardial infarction [facility]) 2005   "had 3 heart attacks this year"  . Paroxysmal atrial fibrillation (HCC)   . Pyloric stenosis   . RBBB   . Renal artery stenosis (HCC)    hhistory of right renal artery stenting and re-intervention for "in-stent restenosis.  . S/P CABG  (coronary artery bypass graft) [place]  . Shortness of breath    "when I had the heart attacks"  . Subclavian artery stenosis, left ([place])   . Ulcer    "small; Dr. Henrene Pastor found it 02/2010"     Assessment: 89 yoM with hx CAD admitted with fever and found to have elevated troponins. Pt started on heparin 750 units/hr prior to transfer to [facility] from OSH ED ~2030 per RN report. Of note, pt also has hx of AFib and had been on Eliquis with last dose at some point yesterday.  5/8 AM update: aPTT is low at 50, using aPTT to dose given apixaban influence on heparin levels, no issues per RN.   Goal of Therapy:  Heparin level 0.3-0.7 units/ml aPTT 66-102 seconds Monitor platelets by anticoagulation protocol: Yes   Plan:  -Inc heparin to 900 units/hr -aPTT at [place], PharmD, [place] Pharmacist Phone: [phone]

## 2018-07-13 NOTE — Progress Notes (Signed)
Progress Note  Patient Name: [name] Date of Encounter: 07/13/2018  Primary Cardiologist: Shelva Majestic, MD    Subjective   [name] is a 73 year old gentleman.  He has a long and extensive history of coronary artery disease.  He has had coronary artery bypass grafting x2.  He had coronary artery bypass grafting in 1984 with redo coronary artery bypass grafting in 1996.  He has had multiple PCI's.  He has had a carotid endarterectomy and renal artery stenting in 2005.  He has atrial fibrillation and is on Eliquis.  He fell 10 days ago and fractured his right hip.  He was hospitalized at [facility] for repair of that hip fracture.  Starting 2 days ago he developed progressive abdominal pain and generalized weakness.  He developed a fever.  He was seen at [facility] and was found to have an infiltrate.  COVID-19 testing was performed and was negative.  He was transferred to [facility] because of an elevated troponin level of 2.33.  His d-dimer was also elevated at 4501.  With normal being less than 500.   I talked to the patient by telephone today.  He specifically denies any episodes of chest discomfor  He states that he has had episodes of chest discomfort with each of his other anginal-like symptoms.  His main complaint now is of abdominal pain and constipation.  Inpatient Medications    Scheduled Meds: . diltiazem  60 mg Oral Q6H  . dutasteride  0.5 mg Oral Daily  . ezetimibe  10 mg Oral Daily  . metoprolol tartrate  75 mg Oral BID  . pantoprazole  40 mg Oral Daily  . polyethylene glycol  17 g Oral BID  . pravastatin  40 mg Oral q1800  . ranolazine  500 mg Oral BID  . senna-docusate  1 tablet Oral BID   Continuous Infusions: . sodium chloride 100 mL/hr at 07/13/18 0005  . ceFEPime (MAXIPIME) IV 2 g (07/13/18 0252)  . heparin 900 Units/hr (07/13/18 3559)  . vancomycin 1,000 mg (07/13/18 0129)   PRN Meds: acetaminophen, bisacodyl, loratadine, nitroGLYCERIN,  ondansetron **OR** ondansetron (ZOFRAN) IV, torsemide   Vital Signs    Vitals:   07/12/18 2054 07/12/18 2358 07/13/18 0452 07/13/18 0807  BP: (!) 136/92 116/88 132/83 102/69  Pulse: (!) 142 (!) 156  87  Resp:  (!) 22 (!) 24 (!) 26  Temp: (!) 103 F (39.4 C) (!) 101.3 F (38.5 C) 98 F (36.7 C) 99.2 F (37.3 C)  TempSrc: Oral Oral Oral Oral  SpO2: 100% 98% 98% 98%  Weight: 58.4 kg     Height: 5\' 5"  (1.651 m)      No intake or output data in the 24 hours ending 07/13/18 0952 Last 3 Weights 07/12/2018 05/25/2018 04/27/2018  Weight (lbs) 128 lb 12 oz 129 lb 140 lb 14 oz  Weight (kg) 58.4 kg 58.514 kg 63.9 kg      Telemetry    Atrial fib with controlled V response  - Personally Reviewed  ECG    Atrial fib ,  RBBB,  TWI in V4-V6 - new from previous ECG  - Personally Reviewed  Physical Exam  BP 102/69 (BP Location: Right Arm)   Pulse 87   Temp 99.2 F (37.3 C) (Oral)   Resp (!) 26   Ht 5\' 5"  (1.651 m)   Wt 58.4 kg   SpO2 98%   BMI 21.42 kg/m    Interviewed over the phone due  to covid 19 concerns. No exam except for VS   Labs    Chemistry Recent Labs  Lab 07/12/18 2336 07/13/18 0354  NA 130* 128*  K 3.5 4.0  CL 100 93*  CO2 20* 21*  GLUCOSE 123* 158*  BUN 24* 28*  CREATININE 0.99 1.20  CALCIUM 7.9* 8.6*  PROT 5.4* 5.7*  ALBUMIN 2.7* 2.8*  AST 31 44*  ALT 14 17  ALKPHOS 56 60  BILITOT 0.9 0.8  GFRNONAA >60 59*  GFRAA >60 >60  ANIONGAP 10 14     Hematology Recent Labs  Lab 07/12/18 2336 07/13/18 0354  WBC 19.4* 23.2*  RBC 2.40* 2.59*  HGB 7.9* 8.7*  HCT 23.1* 25.3*  MCV 96.3 97.7  MCH 32.9 33.6  MCHC 34.2 34.4  RDW 15.1 15.4  PLT 222 236    Cardiac Enzymes Recent Labs  Lab 07/13/18 0733  TROPONINI 5.16*   No results for input(s): TROPIPOC in the last 168 hours.   BNPNo results for input(s): BNP, PROBNP in the last 168 hours.   DDimer No results for input(s): DDIMER in the last 168 hours.   Radiology    Dg Abd Portable 2v   Result Date: 07/13/2018 CLINICAL DATA:  Centralized abdominal pain for the past 5 days. EXAM: PORTABLE ABDOMEN - 2 VIEW COMPARISON:  01/19/2010 FINDINGS: Large stool burden within the cecum and ascending colon. This finding is associated with marked distension of the colon with the cecum measuring approximately they are. Mild gaseous distention of the upstream small bowel with index loop of small bowel within the mid hemiabdomen measuring approximately 3 cm. No pneumoperitoneum, pneumatosis or portal venous gas. Limited visualization of the lower thorax demonstrates sequela prior median sternotomy. Mild scoliotic curvature of the thoracolumbar spine with associated moderate multilevel lumbar spine DDD, incompletely evaluated. Post right femoral neck dynamic screw fixation, incompletely evaluated. IMPRESSION: Large colonic stool burden with findings most suggestive of adynamic ileus though conceivably a distal colonic obstruction could result in a similar appearance. Clinical correlation is advised. Electronically Signed   By: Sandi Mariscal M.D.   On: 07/13/2018 09:30    Cardiac Studies     Patient Profile     73 y.o. male with CAD transferred from [facility] to [facility] with a several day history of fever, weakness, abdominal pain and an elevated troponin level.  Assessment & Plan    1.  Troponin elevation: The patient does have some mild EKG changes.  I reviewed his history of coronary interventions.  He had 2 separate bypass grafting and has had multiple stent procedures.  It is certain that he has diffuse and widespread coronary artery disease.  I suspect that his troponin elevation is due to demand ischemia.  He denies having any episodes of chest pain so I do not think that this is primarily an acute coronary syndrome.  I think this is probably related to his infection.  2.  Fever: The patient presents with a fever of 103.  He is having abdominal pain.  He has an elevated d-dimer  level.  He had COVID testing at [facility] which was negative.  We will have him retested here.  I have recommended that we put him on isolation/droplet/contact  Precautions.  3.           For questions or updates, please contact [place] Please consult [email or web address] for contact info under        Signed, Mertie Moores, MD  07/13/2018, 9:52 AM

## 2018-07-13 NOTE — Progress Notes (Signed)
Pharmacy Antibiotic Note  [name] is a 73 y.o. male admitted on 07/12/2018 with pneumonia.  Pharmacy has been consulted for Vancomycin/Cefepime dosing. Transfer from [place]. Received ceftriaxone and azithromycin at [facility]. Renal function age appropriate.   Plan: Vancomycin 1000 mg IV q24h >>Estimated AUC: 463 Cefepime 2g IV q12h Trend WBC, temp, renal function  F/U infectious work-up Drug levels as indicated   Height: 5\' 5"  (165.1 cm) Weight: 128 lb 12 oz (58.4 kg) IBW/kg (Calculated) : 61.5  Temp (24hrs), Avg:102.2 F (39 C), Min:101.3 F (38.5 C), Max:103 F (39.4 C)  Recent Labs  Lab 07/12/18 2336  WBC PENDING  CREATININE 0.99    Estimated Creatinine Clearance: 54.1 mL/min (by C-G formula based on SCr of 0.99 mg/dL).    Allergies  Allergen Reactions  . Codeine Nausea And Vomiting  . Sulfa Antibiotics Rash  . Sulfonamide Derivatives Rash     Narda Bonds 07/13/2018 12:44 AM

## 2018-07-13 NOTE — Progress Notes (Signed)
[place] for Heparin Indication: chest pain/ACS  Allergies  Allergen Reactions  . Codeine Nausea And Vomiting  . Sulfa Antibiotics Rash  . Sulfonamide Derivatives Rash    Patient Measurements: Height: 5\' 5"  (165.1 cm) Weight: 128 lb 12 oz (58.4 kg) IBW/kg (Calculated) : 61.5 Heparin Dosing Weight: 58kg  Vital Signs: Temp: 99.2 F (37.3 C) (05/08 0807) Temp Source: Oral (05/08 0807) BP: 102/69 (05/08 0807) Pulse Rate: 87 (05/08 0807)  Labs: Recent Labs    07/12/18 2336 07/13/18 0354 07/13/18 0733 07/13/18 1338  HGB 7.9* 8.7*  --   --   HCT 23.1* 25.3*  --   --   PLT 222 236  --   --   APTT  --  50*  --  64*  HEPARINUNFRC  --  >2.20*  --   --   CREATININE 0.99 1.20  --   --   TROPONINI  --   --  5.16* 4.90*    Estimated Creatinine Clearance: 44.6 mL/min (by C-G formula based on SCr of 1.2 mg/dL).   Medical History: Past Medical History:  Diagnosis Date  . Anemia   . Angina   . Atrial fibrillation ([place])   . Blood transfusion   . Bronchitis   . Bulging discs    "8 of them; thoracic, lumbar, sacral area"  . Chronic back pain   . Colon polyps   . Complication of anesthesia   . Coronary artery disease 01/24/11   Successful PCI long segmental stensois  vein graft to the CX marginal vessel-graft previously stented prox. 3.0x80mm TAXUS stent mid seg 3.0x53mm TAXUS stent now tandem stens of 3.0x26mm & 3.0x71mm placed with the seg. 50,80 & 99% stenosis reduced to 0%  . DDD (degenerative disc disease)   . Dysrhythmia    "PAC's and PVC's"  . Esophageal dilatation    "have had it done 7 times; last time ~ 2001"  . Esophageal stricture   . GERD (gastroesophageal reflux disease)   . Headache(784.0)   . Hiatal hernia   . Hyperlipidemia   . Myocardial infarction ([place]) 1991  . Myocardial infarction [facility]) 2005   "had 3 heart attacks this year"  . Paroxysmal atrial fibrillation (HCC)   . Pyloric stenosis   . RBBB   . Renal  artery stenosis (HCC)    hhistory of right renal artery stenting and re-intervention for "in-stent restenosis.  . S/P CABG (coronary artery bypass graft) [place]  . Shortness of breath    "when I had the heart attacks"  . Subclavian artery stenosis, left ([place])   . Ulcer    "small; Dr. Henrene Pastor found it 02/2010"     Assessment: 60 yoM with hx CAD admitted with fever and found to have elevated troponins. Pt started on heparin 750 units/hr prior to transfer to [facility] from OSH ED ~2030 per RN report. Of note, pt also has hx of AFib and had been on Eliquis with last dose at some point yesterday.  5/8 PM update: aPTT is low at 64, using aPTT to dose given apixaban influence on heparin levels, no issues per RN.   Goal of Therapy:  Heparin level 0.3-0.7 units/ml aPTT 66-102 seconds Monitor platelets by anticoagulation protocol: Yes   Plan:  -Increase heparin to 1000 units/hr -aPTT at [place], Pharm.D., BCPS, BCIDP Clinical Pharmacist Phone: [phone] Please check AMION for all [place] numbers 07/13/2018, 2:41 PM

## 2018-07-13 NOTE — H&P (View-Only) (Signed)
Hospital Consult    Reason for Consult:  SMA stenosis noted on PE study Requesting Physician:  Dr. Starla Link MRN #:  [id number]  History of Present Illness: This is a 73 y.o. male with past medical history significant for coronary artery disease with history of CABG and subsequent PCI, peripheral arterial disease with known history of bilateral SFA occlusions, carotid artery stenosis with history of right endarterectomy by Dr. Donnetta Hutching in 1996, renal artery stenosis with stenting by cardiology in 2005 and paroxysmal atrial fibrillation on Eliquis.  He was recently hospitalized at [facility] for repair of hip fracture.  He experienced chest pain shortness of breath tested negative for COVID-19 however was transferred to [facility] due to elevated troponin.  Work-up also included pulmonary embolism study was incidentally noted SMA stenosis.  He also has history of abdominal pain over the past 2 days with loss of appetite.  Prior to having abdominal pain he denies any postprandial pain or fear of food.  He does have about a 10 to 15 pound weight loss over the past couple months however he attributes this to having bronchitis earlier this year which has been lingering.  Currently being treated for pneumonia on IV cefepime.  He has had 2 episodes of diarrhea today however still has lower abdominal pain.  He was given 2 doses of MiraLAX.  Work-up is also included a CBC demonstrating a white count as well as a lactate of 2.3.  Former smoker.    Past Medical History:  Diagnosis Date   Anemia    Angina    Atrial fibrillation ([place])    Blood transfusion    Bronchitis    Bulging discs    "8 of them; thoracic, lumbar, sacral area"   Chronic back pain    Colon polyps    Complication of anesthesia    Coronary artery disease 01/24/11   Successful PCI long segmental stensois  vein graft to the CX marginal vessel-graft previously stented prox. 3.0x31mm TAXUS stent mid seg 3.0x61mm TAXUS stent now tandem stens  of 3.0x64mm & 3.0x26mm placed with the seg. 50,80 & 99% stenosis reduced to 0%   DDD (degenerative disc disease)    Dysrhythmia    "PAC's and PVC's"   Esophageal dilatation    "have had it done 7 times; last time ~ 2001"   Esophageal stricture    GERD (gastroesophageal reflux disease)    Headache(784.0)    Hiatal hernia    Hyperlipidemia    Myocardial infarction [facility]) 1991   Myocardial infarction ([place]) 2005   "had 3 heart attacks this year"   Paroxysmal atrial fibrillation ([place])    Pyloric stenosis    RBBB    Renal artery stenosis (HCC)    hhistory of right renal artery stenting and re-intervention for "in-stent restenosis.   S/P CABG (coronary artery bypass graft) [place]   Shortness of breath    "when I had the heart attacks"   Subclavian artery stenosis, left ([place])    Ulcer    "small; Dr. Henrene Pastor found it 02/2010"    Past Surgical History:  Procedure Laterality Date   ADENOIDECTOMY     "when I was an infant"   [place] N/A 04/17/2017   Procedure: CARDIOVERSION;  Surgeon: Jerline Pain, MD;  Location: [place];  Service: Cardiovascular;  Laterality: N/A;   CAROTID ENDARTERECTOMY [place]  01/24/11   "today makes a total of 9 stents"   [place]   CABG X 4   CORONARY ARTERY BYPASS GRAFT  1996   CABG X 4   LEFT HEART CATHETERIZATION WITH CORONARY/GRAFT ANGIOGRAM N/A 01/24/2011   Procedure: LEFT HEART CATHETERIZATION WITH Beatrix Fetters;  Surgeon: Troy Sine, MD;  Location: [facility] CATH LAB;  Service: Cardiovascular;  Laterality: N/A;   pylondial cyst removal     RETINAL DETACHMENT SURGERY  04/2007   right   TONSILLECTOMY  1972    Allergies  Allergen Reactions   Codeine Nausea And Vomiting   Sulfa Antibiotics Rash   Sulfonamide Derivatives Rash    Prior to Admission medications     Medication Sig Start Date End Date Taking? Authorizing Provider  butalbital-aspirin-caffeine [facility]) 50-325-40 MG per capsule Take 1 capsule by mouth every 4 (four) hours as needed for headache.     [provider]  diltiazem (CARDIZEM) 30 MG tablet Take one tablet by mouth every 4 hours AS NEEDED for A-fib HR > 100 as long as BP > 100. 06/30/17   Sherran Needs, NP  dutasteride (AVODART) 0.5 MG capsule Take 0.5 mg by mouth daily.  03/22/14   [provider]  ELIQUIS 5 MG TABS tablet TAKE 1 TABLET BY MOUTH TWICE A DAY 05/14/18   Camnitz, Ocie Doyne, MD  esomeprazole (NEXIUM) 40 MG capsule Take 1 capsule (40 mg total) by mouth 2 (two) times daily. 06/11/18   Irene Shipper, MD  ezetimibe (ZETIA) 10 MG tablet TAKE 1 TABLET (10 MG TOTAL) BY MOUTH DAILY. 03/05/18   Troy Sine, MD  Ketotifen Fumarate (ALAWAY OP) Place 1 drop into the right eye daily.    [provider]  loratadine (CLARITIN) 10 MG tablet Take 10 mg by mouth daily as needed for allergies, rhinitis or itching.  12/12/13   [provider]  lovastatin (MEVACOR) 40 MG tablet Take 20 mg by mouth at bedtime.    [provider]  metoprolol tartrate (LOPRESSOR) 50 MG tablet Take 1.5 tablets (75 mg total) by mouth 2 (two) times daily. 03/21/18 06/19/18  Camnitz, Ocie Doyne, MD  Multiple Vitamins-Minerals (MULTIVITAMINS THER. W/MINERALS) TABS Take 1 tablet by mouth daily.      [provider]  nitroGLYCERIN (NITROLINGUAL) 0.4 MG/SPRAY spray Place 1 spray under the tongue every 5 (five) minutes x 3 doses as needed for chest pain. 01/01/18   [name], MD  ranolazine (RANEXA) 1000 MG SR tablet Take 500 mg by mouth 2 (two) times daily.    [provider]  torsemide (DEMADEX) 20 MG tablet Take 20 mg by mouth as needed (for fluid). FOR SWELLING OR WEIGHT GAIN    [provider]  vitamin E 400 UNIT capsule Take 400 Units by mouth daily.     [provider]     Social History   Socioeconomic History   Marital status: Married    Spouse name: Not on file   Number of children: Not on file   Years of education: Not on file   Highest education level: Not on file  Occupational History   Not on file  Social Needs   Financial resource strain: Not on file   Food insecurity:    Worry: Not on file    Inability: Not on file   Transportation needs:    Medical: Not on file    Non-medical: Not on file  Tobacco Use  Smoking status: Former Smoker    Packs/day: 1.00    Years: 38.00    Pack years: 38.00    Types: Cigarettes    Last attempt to quit: 02/09/2005    Years since quitting: 13.4   Smokeless tobacco: Never Used   Tobacco comment: "quit smoking cigarrettes 2006"  Substance and Sexual Activity   Alcohol use: No    Alcohol/week: 0.0 standard drinks   Drug use: No   Sexual activity: Not on file  Lifestyle   Physical activity:    Days per week: Not on file    Minutes per session: Not on file   Stress: Not on file  Relationships   Social connections:    Talks on phone: Not on file    Gets together: Not on file    Attends religious service: Not on file    Active member of club or organization: Not on file    Attends meetings of clubs or organizations: Not on file    Relationship status: Not on file   Intimate partner violence:    Fear of current or ex partner: Not on file    Emotionally abused: Not on file    Physically abused: Not on file    Forced sexual activity: Not on file  Other Topics Concern   Not on file  Social History Narrative   Not on file     Family History  Problem Relation Age of Onset   Breast cancer Paternal Aunt    Diabetes Sister    Ulcers Father    Heart disease Father    Heart failure Father    Heart attack Father    AAA (abdominal aortic aneurysm) Father    Stroke Mother    Hypertension Mother    Colon cancer Neg Hx     ROS: Otherwise negative unless mentioned  in HPI  Physical Examination  Vitals:   07/13/18 0452 07/13/18 0807  BP: 132/83 102/69  Pulse:  87  Resp: (!) 24 (!) 26  Temp: 98 F (36.7 C) 99.2 F (37.3 C)  SpO2: 98% 98%   Body mass index is 21.42 kg/m.  General:  WDWN in NAD Gait: Not observed HENT: WNL, normocephalic Pulmonary: normal non-labored breathing Cardiac: regular Abdomen:  Soft, diffuse tenderness especially in lower quadrants Skin: without rashes Vascular Exam/Pulses: feet symmetrically warm to touch with good capillary refill; no active tissue ischemia Extremities: without ischemic changes, without Gangrene , without cellulitis; without open wounds;  Musculoskeletal: no muscle wasting or atrophy  Neurologic: A&O X 3;  No focal weakness or paresthesias are detected; speech is fluent/normal Psychiatric:  The pt has Normal affect. Lymph:  Unremarkable  CBC    Component Value Date/Time   WBC 23.2 (H) 07/13/2018 0354   RBC 2.59 (L) 07/13/2018 0354   HGB 8.7 (L) 07/13/2018 0354   HGB 11.4 (L) 05/30/2017 1411   HCT 25.3 (L) 07/13/2018 0354   HCT 33.5 (L) 05/30/2017 1411   PLT 236 07/13/2018 0354   PLT 163 05/30/2017 1411   MCV 97.7 07/13/2018 0354   MCV 91 05/30/2017 1411   MCH 33.6 07/13/2018 0354   MCHC 34.4 07/13/2018 0354   RDW 15.4 07/13/2018 0354   RDW 15.1 05/30/2017 1411   LYMPHSABS 0.4 (L) 07/12/2018 2336   MONOABS 1.4 (H) 07/12/2018 2336   EOSABS 0.0 07/12/2018 2336   BASOSABS 0.0 07/12/2018 2336    BMET    Component Value Date/Time   NA 128 (L) 07/13/2018 0354  NA 143 05/30/2017 1411   K 4.0 07/13/2018 0354   CL 93 (L) 07/13/2018 0354   CO2 21 (L) 07/13/2018 0354   GLUCOSE 158 (H) 07/13/2018 0354   BUN 28 (H) 07/13/2018 0354   BUN 22 05/30/2017 1411   CREATININE 1.20 07/13/2018 0354   CREATININE 1.12 10/14/2015 1117   CALCIUM 8.6 (L) 07/13/2018 0354   GFRNONAA 59 (L) 07/13/2018 0354   GFRAA >60 07/13/2018 0354    COAGS: Lab Results  Component Value Date   INR 1.08  01/24/2011     Non-Invasive Vascular Imaging:   B ABI 0.7 with B SFA occlusions as of 2014  Statin:  Yes.   Beta Blocker:  Yes.   Aspirin:  No. ACEI:  No. ARB:  No. CCB use:  Yes Other antiplatelets/anticoagulants:  Yes.   Eliquis   ASSESSMENT/PLAN: This is a 73 y.o. male with incidental finding of SMA stenosis; 2 day history of abd pain  -COVID neagtive x2 -Unclear etiology of abdominal pain; check CTA abdomen/pelvis; will also use oral contrast given KUB findings -Elevated troponin likely due to demand ischemia per Cardiology -IV antibiotics for suspected pneumonia, leukocytosis -Dr. Trula Slade will evaluate the patient later and provide further treatment plans    Dagoberto Ligas PA-C [facility] [phone]  I agree with the above.  I have seen and evaluated the patient.  On KUB, he has a significant amount of stool in his colon.  The cecum measures 10 cm.  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  He has had several bowel movements today following laxative administration and states that his abdomen is feeling better.  On physical exam his abdomen remains soft.  Currently the patient is dealing with multiple medical issues.  In order to better evaluate him for intestinal ischemia, he needs to undergo CT angiography of the abdomen and pelvis.  We will also administer oral contrast which could be therapeutic as well as diagnostic.  Further recommendations regarding intervention will be made based off the CT scan.  At this point, I think it is unlikely that he has ischemic bowel secondary to mesenteric stenosis, however additional imaging of his abdomen will thus determine this.  We will follow-up after his CT scan has been performed.  Annamarie Major

## 2018-07-13 NOTE — Progress Notes (Signed)
Patient ID: [name], male   DOB: 02/23/1946, 73 y.o.   MRN: [id number]  PROGRESS NOTE    TRAVANTI MCMANUS  XBL:[id number] DOB: 01-12-1946 DOA: 07/12/2018 PCP: Garwin Brothers, MD   Brief Narrative:  73 year old male with history of coronary artery disease status post CABG x2 in 1984 and 1996, subsequent MI with multiple PCI's, peripheral vascular disease status post CEA in 1906 and renal artery stenting in 2005, paroxysmal A. fib on Eliquis, GERD and recent right hip fracture status post repair at [facility] and subsequent discharged on 07/07/2018 was transferred from [facility] for fever, cough and shortness of breath.  COVID-19 testing was negative.  Troponin was 2.70.  CT angiogram of the chest showed no PE but areas of mild mucus impaction in the right lower lobe along with severe stenosis of the SMA origin.  Cardiology was consulted and patient was transferred to [facility] and started on heparin drip.  He was also started on broad-spectrum antibiotics.  Assessment & Plan:   Principal Problem:   NSTEMI (non-ST elevated myocardial infarction) [facility]) Active Problems:   Essential hypertension   Hx of CABG '84, '96, last PCI 2012   GASTROESOPHAGEAL REFLUX DISEASE   PVD (peripheral vascular disease) ([place])   Carotid disease, bilateral ([place])   Hyperlipidemia LDL goal <70   Paroxysmal atrial fibrillation (HCC)   Coronary artery disease   Fever   Lobar pneumonia (HCC)  Positive troponin/probable non-STEMI in a patient with known history of coronary artery disease/CABG and history of multiple PCI's -Troponin on presentation was 2.7.  Will get repeat stat troponin and trend troponins.  Also will get EKG.  Currently no chest pain but having lower abdominal pain.  Continue heparin drip.  Cardiology evaluation appreciated; will follow recommendations.  I spoke to Dr. Nahser/cardiology on phone and discussed the plan of care.  Probable healthcare associated pneumonia -With concern for  gram-negative versus MRSA pneumonia -CT angiogram of the chest showed no PE but areas of mild mucus impaction in the right lower lobe -COVID-19 testing negative -Currently on room air -Follow cultures -Broad-spectrum antibiotics  Leukocytosis -From above.  Monitor  Hyponatremia -From poor oral intake.  Continue IV fluids.  Constipation -Complains of severe lower abdominal pain.  Will get a stat x-ray of the abdomen.  If x-ray looks okay, will start laxatives.  Paroxysmal A. fib with RVR -Still tachycardic.  Continue Cardizem and beta-blocker.  Continue heparin drip.  Hold Eliquis for now.  Cardiology following  Severe SMA stenosis as seen on CT angiogram of the chest -Spoke to Dr. Lissa Morales surgery who will evaluate the patient.  He does not think that abdominal pain is secondary to the SMA stenosis and thinks that this might be chronic.  Patient will probably need CT angiogram of the abdomen at some point to evaluate for the same.  Peripheral vascular disease -Continue with statin  GERD -Continue with PPIs  Hyperlipidemia--continue with statin  Chronic back pain -Continue home regimen of pain medications  Right hip fracture status post recent right hip surgery -Right hip incisional wound looks fine.  No evidence of hip infection.  Outpatient follow-up with orthopedics   DVT prophylaxis: Heparin drip Code Status: Full Family Communication: None at bedside Disposition Plan: Depends on clinical outcome  Consultants: Cardiology/vascular surgery  Procedures: None  Antimicrobials: Vancomycin and cefepime from 07/12/2018 onwards   Subjective: Patient seen and examined at bedside.  He complains of severe lower abdominal pain with some nausea.  No vomiting  currently.  Has not had any bowel movement for the last 4 days.  Denies any current chest pain.  Still having temperatures overnight.  Objective: Vitals:   07/12/18 2054 07/12/18 2358 07/13/18 0452 07/13/18 0807   BP: (!) 136/92 116/88 132/83 102/69  Pulse: (!) 142 (!) 156  87  Resp:  (!) 22 (!) 24 (!) 26  Temp: (!) 103 F (39.4 C) (!) 101.3 F (38.5 C) 98 F (36.7 C) 99.2 F (37.3 C)  TempSrc: Oral Oral Oral Oral  SpO2: 100% 98% 98% 98%  Weight: 58.4 kg     Height: 5\' 5"  (1.651 m)      No intake or output data in the 24 hours ending 07/13/18 0929 Filed Weights   07/12/18 2054  Weight: 58.4 kg    Examination:  General exam: Appears in mild distress secondary to abdominal pain Respiratory system: Bilateral decreased breath sounds at bases with some scattered crackles Cardiovascular system: S1 & S2 heard, tachycardic Gastrointestinal system: Abdomen is slightly distended, soft and tender in the lower quadrant, no rebound tenderness.  Bowel sounds sluggish. Extremities: No cyanosis, clubbing, edema.  Right hip incision looks clean without any drainage  Central nervous system: Alert and oriented. No focal neurological deficits. Moving extremities Skin: Ecchymosis around the right hip region  psychiatry: Judgement and insight appear normal. Mood & affect appropriate.     Data Reviewed: I have personally reviewed following labs and imaging studies  CBC: Recent Labs  Lab 07/12/18 2336 07/13/18 0354  WBC 19.4* 23.2*  NEUTROABS 17.3*  --   HGB 7.9* 8.7*  HCT 23.1* 25.3*  MCV 96.3 97.7  PLT 222 818   Basic Metabolic Panel: Recent Labs  Lab 07/12/18 2336 07/13/18 0354  NA 130* 128*  K 3.5 4.0  CL 100 93*  CO2 20* 21*  GLUCOSE 123* 158*  BUN 24* 28*  CREATININE 0.99 1.20  CALCIUM 7.9* 8.6*   GFR: Estimated Creatinine Clearance: 44.6 mL/min (by C-G formula based on SCr of 1.2 mg/dL). Liver Function Tests: Recent Labs  Lab 07/12/18 2336 07/13/18 0354  AST 31 44*  ALT 14 17  ALKPHOS 56 60  BILITOT 0.9 0.8  PROT 5.4* 5.7*  ALBUMIN 2.7* 2.8*   No results for input(s): LIPASE, AMYLASE in the last 168 hours. No results for input(s): AMMONIA in the last 168 hours.  Coagulation Profile: No results for input(s): INR, PROTIME in the last 168 hours. Cardiac Enzymes: Recent Labs  Lab 07/13/18 0733  TROPONINI 5.16*   BNP (last 3 results) No results for input(s): PROBNP in the last 8760 hours. HbA1C: No results for input(s): HGBA1C in the last 72 hours. CBG: No results for input(s): GLUCAP in the last 168 hours. Lipid Profile: No results for input(s): CHOL, HDL, LDLCALC, TRIG, CHOLHDL, LDLDIRECT in the last 72 hours. Thyroid Function Tests: Recent Labs    07/12/18 2336  TSH 0.388   Anemia Panel: No results for input(s): VITAMINB12, FOLATE, FERRITIN, TIBC, IRON, RETICCTPCT in the last 72 hours. Sepsis Labs: No results for input(s): PROCALCITON, LATICACIDVEN in the last 168 hours.  Recent Results (from the past 240 hour(s))  Culture, blood (Routine X 2) w Reflex to ID Panel     Status: None (Preliminary result)   Collection Time: 07/12/18 11:36 PM  Result Value Ref Range Status   Specimen Description BLOOD RIGHT ARM  Final   Special Requests   Final    BOTTLES DRAWN AEROBIC ONLY Blood Culture adequate volume   Culture  Final    NO GROWTH < 12 HOURS Performed at [facility] [address] [place], [place] [address]    Report Status PENDING  Incomplete  Culture, blood (Routine X 2) w Reflex to ID Panel     Status: None (Preliminary result)   Collection Time: 07/12/18 11:38 PM  Result Value Ref Range Status   Specimen Description BLOOD RIGHT HAND  Final   Special Requests   Final    BOTTLES DRAWN AEROBIC ONLY Blood Culture adequate volume   Culture   Final    NO GROWTH < 12 HOURS Performed at [facility], [place] [address], [place], [place] [address]    Report Status PENDING  Incomplete         Radiology Studies: No results found.      Scheduled Meds: . diltiazem  60 mg Oral Q6H  . dutasteride  0.5 mg Oral Daily  . ezetimibe  10 mg Oral Daily  . metoprolol tartrate  75 mg Oral BID  . pantoprazole  40 mg  Oral Daily  . polyethylene glycol  17 g Oral BID  . pravastatin  40 mg Oral q1800  . ranolazine  500 mg Oral BID   Continuous Infusions: . sodium chloride 100 mL/hr at 07/13/18 0005  . ceFEPime (MAXIPIME) IV 2 g (07/13/18 0252)  . heparin 900 Units/hr (07/13/18 7614)  . vancomycin 1,000 mg (07/13/18 0129)     LOS: 1 day        Aline August, MD [facility] 07/13/2018, 9:29 AM

## 2018-07-13 NOTE — Consult Note (Addendum)
Hospital Consult    Reason for Consult:  SMA stenosis noted on PE study Requesting Physician:  Dr. Starla Link MRN #:  [id number]  History of Present Illness: This is a 73 y.o. male with past medical history significant for coronary artery disease with history of CABG and subsequent PCI, peripheral arterial disease with known history of bilateral SFA occlusions, carotid artery stenosis with history of right endarterectomy by Dr. Donnetta Hutching in 1996, renal artery stenosis with stenting by cardiology in 2005 and paroxysmal atrial fibrillation on Eliquis.  He was recently hospitalized at [facility] for repair of hip fracture.  He experienced chest pain shortness of breath tested negative for COVID-19 however was transferred to [facility] due to elevated troponin.  Work-up also included pulmonary embolism study was incidentally noted SMA stenosis.  He also has history of abdominal pain over the past 2 days with loss of appetite.  Prior to having abdominal pain he denies any postprandial pain or fear of food.  He does have about a 10 to 15 pound weight loss over the past couple months however he attributes this to having bronchitis earlier this year which has been lingering.  Currently being treated for pneumonia on IV cefepime.  He has had 2 episodes of diarrhea today however still has lower abdominal pain.  He was given 2 doses of MiraLAX.  Work-up is also included a CBC demonstrating a white count as well as a lactate of 2.3.  Former smoker.    Past Medical History:  Diagnosis Date   Anemia    Angina    Atrial fibrillation ([place])    Blood transfusion    Bronchitis    Bulging discs    "8 of them; thoracic, lumbar, sacral area"   Chronic back pain    Colon polyps    Complication of anesthesia    Coronary artery disease 01/24/11   Successful PCI long segmental stensois  vein graft to the CX marginal vessel-graft previously stented prox. 3.0x87mm TAXUS stent mid seg 3.0x10mm TAXUS stent now tandem stens  of 3.0x66mm & 3.0x6mm placed with the seg. 50,80 & 99% stenosis reduced to 0%   DDD (degenerative disc disease)    Dysrhythmia    "PAC's and PVC's"   Esophageal dilatation    "have had it done 7 times; last time ~ 2001"   Esophageal stricture    GERD (gastroesophageal reflux disease)    Headache(784.0)    Hiatal hernia    Hyperlipidemia    Myocardial infarction [facility]) 1991   Myocardial infarction ([place]) 2005   "had 3 heart attacks this year"   Paroxysmal atrial fibrillation ([place])    Pyloric stenosis    RBBB    Renal artery stenosis (HCC)    hhistory of right renal artery stenting and re-intervention for "in-stent restenosis.   S/P CABG (coronary artery bypass graft) [place]   Shortness of breath    "when I had the heart attacks"   Subclavian artery stenosis, left ([place])    Ulcer    "small; Dr. Henrene Pastor found it 02/2010"    Past Surgical History:  Procedure Laterality Date   ADENOIDECTOMY     "when I was an infant"   [place] N/A 04/17/2017   Procedure: CARDIOVERSION;  Surgeon: Jerline Pain, MD;  Location: [place];  Service: Cardiovascular;  Laterality: N/A;   CAROTID ENDARTERECTOMY [place]  01/24/11   "today makes a total of 9 stents"   [place]   CABG X 4   CORONARY ARTERY BYPASS GRAFT  1996   CABG X 4   LEFT HEART CATHETERIZATION WITH CORONARY/GRAFT ANGIOGRAM N/A 01/24/2011   Procedure: LEFT HEART CATHETERIZATION WITH Beatrix Fetters;  Surgeon: Troy Sine, MD;  Location: [facility] CATH LAB;  Service: Cardiovascular;  Laterality: N/A;   pylondial cyst removal     RETINAL DETACHMENT SURGERY  04/2007   right   TONSILLECTOMY  1972    Allergies  Allergen Reactions   Codeine Nausea And Vomiting   Sulfa Antibiotics Rash   Sulfonamide Derivatives Rash    Prior to Admission medications     Medication Sig Start Date End Date Taking? Authorizing Provider  butalbital-aspirin-caffeine [facility]) 50-325-40 MG per capsule Take 1 capsule by mouth every 4 (four) hours as needed for headache.     [provider]  diltiazem (CARDIZEM) 30 MG tablet Take one tablet by mouth every 4 hours AS NEEDED for A-fib HR > 100 as long as BP > 100. 06/30/17   Sherran Needs, NP  dutasteride (AVODART) 0.5 MG capsule Take 0.5 mg by mouth daily.  03/22/14   [provider]  ELIQUIS 5 MG TABS tablet TAKE 1 TABLET BY MOUTH TWICE A DAY 05/14/18   Camnitz, Ocie Doyne, MD  esomeprazole (NEXIUM) 40 MG capsule Take 1 capsule (40 mg total) by mouth 2 (two) times daily. 06/11/18   Irene Shipper, MD  ezetimibe (ZETIA) 10 MG tablet TAKE 1 TABLET (10 MG TOTAL) BY MOUTH DAILY. 03/05/18   Troy Sine, MD  Ketotifen Fumarate (ALAWAY OP) Place 1 drop into the right eye daily.    [provider]  loratadine (CLARITIN) 10 MG tablet Take 10 mg by mouth daily as needed for allergies, rhinitis or itching.  12/12/13   [provider]  lovastatin (MEVACOR) 40 MG tablet Take 20 mg by mouth at bedtime.    [provider]  metoprolol tartrate (LOPRESSOR) 50 MG tablet Take 1.5 tablets (75 mg total) by mouth 2 (two) times daily. 03/21/18 06/19/18  Camnitz, Ocie Doyne, MD  Multiple Vitamins-Minerals (MULTIVITAMINS THER. W/MINERALS) TABS Take 1 tablet by mouth daily.      [provider]  nitroGLYCERIN (NITROLINGUAL) 0.4 MG/SPRAY spray Place 1 spray under the tongue every 5 (five) minutes x 3 doses as needed for chest pain. 01/01/18   [name], MD  ranolazine (RANEXA) 1000 MG SR tablet Take 500 mg by mouth 2 (two) times daily.    [provider]  torsemide (DEMADEX) 20 MG tablet Take 20 mg by mouth as needed (for fluid). FOR SWELLING OR WEIGHT GAIN    [provider]  vitamin E 400 UNIT capsule Take 400 Units by mouth daily.     [provider]     Social History   Socioeconomic History   Marital status: Married    Spouse name: Not on file   Number of children: Not on file   Years of education: Not on file   Highest education level: Not on file  Occupational History   Not on file  Social Needs   Financial resource strain: Not on file   Food insecurity:    Worry: Not on file    Inability: Not on file   Transportation needs:    Medical: Not on file    Non-medical: Not on file  Tobacco Use  Smoking status: Former Smoker    Packs/day: 1.00    Years: 38.00    Pack years: 38.00    Types: Cigarettes    Last attempt to quit: 02/09/2005    Years since quitting: 13.4   Smokeless tobacco: Never Used   Tobacco comment: "quit smoking cigarrettes 2006"  Substance and Sexual Activity   Alcohol use: No    Alcohol/week: 0.0 standard drinks   Drug use: No   Sexual activity: Not on file  Lifestyle   Physical activity:    Days per week: Not on file    Minutes per session: Not on file   Stress: Not on file  Relationships   Social connections:    Talks on phone: Not on file    Gets together: Not on file    Attends religious service: Not on file    Active member of club or organization: Not on file    Attends meetings of clubs or organizations: Not on file    Relationship status: Not on file   Intimate partner violence:    Fear of current or ex partner: Not on file    Emotionally abused: Not on file    Physically abused: Not on file    Forced sexual activity: Not on file  Other Topics Concern   Not on file  Social History Narrative   Not on file     Family History  Problem Relation Age of Onset   Breast cancer Paternal Aunt    Diabetes Sister    Ulcers Father    Heart disease Father    Heart failure Father    Heart attack Father    AAA (abdominal aortic aneurysm) Father    Stroke Mother    Hypertension Mother    Colon cancer Neg Hx     ROS: Otherwise negative unless mentioned  in HPI  Physical Examination  Vitals:   07/13/18 0452 07/13/18 0807  BP: 132/83 102/69  Pulse:  87  Resp: (!) 24 (!) 26  Temp: 98 F (36.7 C) 99.2 F (37.3 C)  SpO2: 98% 98%   Body mass index is 21.42 kg/m.  General:  WDWN in NAD Gait: Not observed HENT: WNL, normocephalic Pulmonary: normal non-labored breathing Cardiac: regular Abdomen:  Soft, diffuse tenderness especially in lower quadrants Skin: without rashes Vascular Exam/Pulses: feet symmetrically warm to touch with good capillary refill; no active tissue ischemia Extremities: without ischemic changes, without Gangrene , without cellulitis; without open wounds;  Musculoskeletal: no muscle wasting or atrophy  Neurologic: A&O X 3;  No focal weakness or paresthesias are detected; speech is fluent/normal Psychiatric:  The pt has Normal affect. Lymph:  Unremarkable  CBC    Component Value Date/Time   WBC 23.2 (H) 07/13/2018 0354   RBC 2.59 (L) 07/13/2018 0354   HGB 8.7 (L) 07/13/2018 0354   HGB 11.4 (L) 05/30/2017 1411   HCT 25.3 (L) 07/13/2018 0354   HCT 33.5 (L) 05/30/2017 1411   PLT 236 07/13/2018 0354   PLT 163 05/30/2017 1411   MCV 97.7 07/13/2018 0354   MCV 91 05/30/2017 1411   MCH 33.6 07/13/2018 0354   MCHC 34.4 07/13/2018 0354   RDW 15.4 07/13/2018 0354   RDW 15.1 05/30/2017 1411   LYMPHSABS 0.4 (L) 07/12/2018 2336   MONOABS 1.4 (H) 07/12/2018 2336   EOSABS 0.0 07/12/2018 2336   BASOSABS 0.0 07/12/2018 2336    BMET    Component Value Date/Time   NA 128 (L) 07/13/2018 0354  NA 143 05/30/2017 1411   K 4.0 07/13/2018 0354   CL 93 (L) 07/13/2018 0354   CO2 21 (L) 07/13/2018 0354   GLUCOSE 158 (H) 07/13/2018 0354   BUN 28 (H) 07/13/2018 0354   BUN 22 05/30/2017 1411   CREATININE 1.20 07/13/2018 0354   CREATININE 1.12 10/14/2015 1117   CALCIUM 8.6 (L) 07/13/2018 0354   GFRNONAA 59 (L) 07/13/2018 0354   GFRAA >60 07/13/2018 0354    COAGS: Lab Results  Component Value Date   INR 1.08  01/24/2011     Non-Invasive Vascular Imaging:   B ABI 0.7 with B SFA occlusions as of 2014  Statin:  Yes.   Beta Blocker:  Yes.   Aspirin:  No. ACEI:  No. ARB:  No. CCB use:  Yes Other antiplatelets/anticoagulants:  Yes.   Eliquis   ASSESSMENT/PLAN: This is a 73 y.o. male with incidental finding of SMA stenosis; 2 day history of abd pain  -COVID neagtive x2 -Unclear etiology of abdominal pain; check CTA abdomen/pelvis; will also use oral contrast given KUB findings -Elevated troponin likely due to demand ischemia per Cardiology -IV antibiotics for suspected pneumonia, leukocytosis -Dr. Trula Slade will evaluate the patient later and provide further treatment plans    Dagoberto Ligas PA-C [facility] [phone]  I agree with the above.  I have seen and evaluated the patient.  On KUB, he has a significant amount of stool in his colon.  The cecum measures 10 cm.  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  He has had several bowel movements today following laxative administration and states that his abdomen is feeling better.  On physical exam his abdomen remains soft.  Currently the patient is dealing with multiple medical issues.  In order to better evaluate him for intestinal ischemia, he needs to undergo CT angiography of the abdomen and pelvis.  We will also administer oral contrast which could be therapeutic as well as diagnostic.  Further recommendations regarding intervention will be made based off the CT scan.  At this point, I think it is unlikely that he has ischemic bowel secondary to mesenteric stenosis, however additional imaging of his abdomen will thus determine this.  We will follow-up after his CT scan has been performed.  Annamarie Major

## 2018-07-13 NOTE — Consult Note (Signed)
Cardiology Consult    Patient ID: [name] MRN: [id number], DOB/AGE: Oct 31, 1945   Admit date: 07/12/2018 Date of Consult: 07/13/2018  Primary Physician: Garwin Brothers, MD Primary Cardiologist: Shelva Majestic, MD Requesting Provider: Elwyn Reach, MD  Patient Profile    [name] is a 73 y.o. male with a history of CAD s/p CABG in 1984, redo CABG in 1996, LMCA PCI in 1998, and multiple PCIs of the OM vein graft. He also has PVD s/p CEA in 1996 and renal artery stenting in 2005, and AF on Eliquis. He fell 10 days ago and had R hip fracture, repaired at [facility], discharged on 5/2 to home. He now presents primarily for weakness and abdominal pain.   History of Present Illness    [name] complains primarily of 2 days of severe fatigue. He felt better when he was recently discharged. He also has new central abdominal pain that is fairly severe. He has not been voiding or defecating as much as usual for the past 5 days. He can't say if the pain is really worse with eating as he hasn't been eating much. Temp was 100.9. He does have a productive cough but states this is chronic and not really any worse currently. Prior to these last couple of days, he was in his usual state of health and doing well. Today, fever persisted and he became too weak to stand, so called 911.   He initially presented to [facility] - temperature then was 99 9 with a blood pressure of 190/56, pulse 127, and tachypnea. On transfer here, was found to be febrile up to 103 F. He was in AF with rates up to the 150s initially but normotensive and oxygenating well. Labs were significant for Na 140, bicarb 20, normal renal function, WBC 19.4, and Hgb 7.9 from 11.2 at discharge from the hospital. C-reactive protein is 165.  D-dimer 4501.  COVID-19 testing was negative.  CT angiogram of the chest showed no PE but areas of mild mucus impaction in the right lower lobe.  Also new severe stenosis of the SMA origin. He has been  started on broad spectrum antibiotics and blood cultures have been sent. A troponin was sent and was incidentally found to be 2.7. He was started on heparin and transferred here for cardiology consultation.   Past Medical History   Past Medical History:  Diagnosis Date  . Anemia   . Angina   . Atrial fibrillation ([place])   . Blood transfusion   . Bronchitis   . Bulging discs    "8 of them; thoracic, lumbar, sacral area"  . Chronic back pain   . Colon polyps   . Complication of anesthesia   . Coronary artery disease 01/24/11   Successful PCI long segmental stensois  vein graft to the CX marginal vessel-graft previously stented prox. 3.0x36mm TAXUS stent mid seg 3.0x24mm TAXUS stent now tandem stens of 3.0x47mm & 3.0x34mm placed with the seg. 50,80 & 99% stenosis reduced to 0%  . DDD (degenerative disc disease)   . Dysrhythmia    "PAC's and PVC's"  . Esophageal dilatation    "have had it done 7 times; last time ~ 2001"  . Esophageal stricture   . GERD (gastroesophageal reflux disease)   . Headache(784.0)   . Hiatal hernia   . Hyperlipidemia   . Myocardial infarction ([place]) 1991  . Myocardial infarction [facility]) 2005   "had 3 heart attacks this year"  . Paroxysmal atrial fibrillation (  [place])   . Pyloric stenosis   . RBBB   . Renal artery stenosis (HCC)    hhistory of right renal artery stenting and re-intervention for "in-stent restenosis.  . S/P CABG (coronary artery bypass graft) [place]  . Shortness of breath    "when I had the heart attacks"  . Subclavian artery stenosis, left ([place])   . Ulcer    "small; Dr. Henrene Pastor found it 02/2010"    Past Surgical History:  Procedure Laterality Date  . ADENOIDECTOMY     "when I was an infant"  . APPENDECTOMY  1953  . CARDIOVERSION N/A 04/17/2017   Procedure: CARDIOVERSION;  Surgeon: Jerline Pain, MD;  Location: [facility] ENDOSCOPY;  Service: Cardiovascular;  Laterality: N/A;  . CAROTID ENDARTERECTOMY Right  [place]  01/24/11   "today makes a total of 9 stents"  . CORONARY ARTERY BYPASS GRAFT  1984   CABG X 4  . CORONARY ARTERY BYPASS GRAFT  1996   CABG X 4  . LEFT HEART CATHETERIZATION WITH CORONARY/GRAFT ANGIOGRAM N/A 01/24/2011   Procedure: LEFT HEART CATHETERIZATION WITH Beatrix Fetters;  Surgeon: Troy Sine, MD;  Location: [facility] CATH LAB;  Service: Cardiovascular;  Laterality: N/A;  . pylondial cyst removal    . RETINAL DETACHMENT SURGERY  04/2007   right  . TONSILLECTOMY  1972     Allergies  Allergen Reactions  . Codeine Nausea And Vomiting  . Sulfa Antibiotics Rash  . Sulfonamide Derivatives Rash   Inpatient Medications    . diltiazem  60 mg Oral Q6H  . dutasteride  0.5 mg Oral Daily  . ezetimibe  10 mg Oral Daily  . metoprolol tartrate  75 mg Oral BID  . pantoprazole  40 mg Oral Daily  . polyethylene glycol  17 g Oral BID  . pravastatin  40 mg Oral q1800  . ranolazine  500 mg Oral BID    Family History    Family History  Problem Relation Age of Onset  . Breast cancer Paternal Aunt   . Diabetes Sister   . Ulcers Father   . Heart disease Father   . Heart failure Father   . Heart attack Father   . AAA (abdominal aortic aneurysm) Father   . Stroke Mother   . Hypertension Mother   . Colon cancer Neg Hx    He indicated that his mother is deceased. He indicated that his father is deceased. He indicated that his sister is alive. He indicated that his maternal grandmother is deceased. He indicated that his maternal grandfather is deceased. He indicated that his paternal grandmother is deceased. He indicated that his paternal grandfather is deceased. He indicated that both of his sons are alive. He indicated that his paternal aunt is deceased. He indicated that the status of his neg hx is unknown.   Social History    Social History   Socioeconomic History  . Marital status: Married    Spouse name: Not on file   . Number of children: Not on file  . Years of education: Not on file  . Highest education level: Not on file  Occupational History  . Not on file  Social Needs  . Financial resource strain: Not on file  . Food insecurity:    Worry: Not on file    Inability: Not on file  . Transportation needs:    Medical: Not on  file    Non-medical: Not on file  Tobacco Use  . Smoking status: Former Smoker    Packs/day: 1.00    Years: 38.00    Pack years: 38.00    Types: Cigarettes    Last attempt to quit: 02/09/2005    Years since quitting: 13.4  . Smokeless tobacco: Never Used  . Tobacco comment: "quit smoking cigarrettes 2006"  Substance and Sexual Activity  . Alcohol use: No    Alcohol/week: 0.0 standard drinks  . Drug use: No  . Sexual activity: Not on file  Lifestyle  . Physical activity:    Days per week: Not on file    Minutes per session: Not on file  . Stress: Not on file  Relationships  . Social connections:    Talks on phone: Not on file    Gets together: Not on file    Attends religious service: Not on file    Active member of club or organization: Not on file    Attends meetings of clubs or organizations: Not on file    Relationship status: Not on file  . Intimate partner violence:    Fear of current or ex partner: Not on file    Emotionally abused: Not on file    Physically abused: Not on file    Forced sexual activity: Not on file  Other Topics Concern  . Not on file  Social History Narrative  . Not on file     Review of Systems    General:  No chills, fever, night sweats or weight changes.  Cardiovascular:  No chest pain, dyspnea on exertion, edema, orthopnea, palpitations, paroxysmal nocturnal dyspnea. Dermatological: No rash, lesions/masses Respiratory: No cough, dyspnea Urologic: No hematuria, dysuria Abdominal:   No nausea, vomiting, diarrhea, bright red blood per rectum, melena, or hematemesis Neurologic:  No visual changes, wkns, changes in mental  status. All other systems reviewed and are otherwise negative except as noted above.  Physical Exam    Blood pressure 116/88, pulse (!) 156, temperature (!) 101.3 F (38.5 C), temperature source Oral, resp. rate (!) 22, height 5\' 5"  (1.651 m), weight 58.4 kg, SpO2 98 %.    No intake or output data in the 24 hours ending 07/13/18 0422 Wt Readings from Last 3 Encounters:  07/12/18 58.4 kg  05/25/18 58.5 kg  04/27/18 63.9 kg    CONSTITUTIONAL: alert, thin, somewhat ill appearing but in no acute distress HEENT: Normal CARDIOVASCULAR: Regular rhythm. No gallop, murmur, or rub. Normal S1/S2. Radial pulses intact. No JVD No carotid bruits. PULMONARY/CHEST WALL: Crackles at right lung base. Normal breath sounds bilaterally, normal work of breathing ABDOMINAL: soft, non-tender, non-distended EXTREMITIES: 1+ edema, warm and well-perfused, surgical wound without drainage or erythema, though with surrounding ecchymoses.  NEUROLOGIC: alert, no abnormal movements, cranial nerves grossly intact.   Labs    Troponin (Point of Care Test) No results for input(s): TROPIPOC in the last 72 hours. No results for input(s): CKTOTAL, CKMB, TROPONINI in the last 72 hours. Lab Results  Component Value Date   WBC 23.2 (H) 07/13/2018   HGB 8.7 (L) 07/13/2018   HCT 25.3 (L) 07/13/2018   MCV 97.7 07/13/2018   PLT 236 07/13/2018    Recent Labs  Lab 07/12/18 2336  NA 130*  K 3.5  CL 100  CO2 20*  BUN 24*  CREATININE 0.99  CALCIUM 7.9*  PROT 5.4*  BILITOT 0.9  ALKPHOS 56  ALT 14  AST 31  GLUCOSE 123*  Lab Results  Component Value Date   CHOL 143 05/25/2018   HDL 63 05/25/2018   LDLCALC 63 05/25/2018   TRIG 84 05/25/2018   No results found for: [facility]   Radiology Studies    See HPI regarding OSH CT results.  ECG & Cardiac Imaging    NSR vs atrial flutter with 3:1 conduction. RBBB and LPFB. Nonspecific ST-T abnormality.  Assessment & Plan    Myocardial injury likely due to demand  ischemia secondary to acute infection, acute anemia, tachycardia, and underlying chronic coronary disease.  Given his lack of chest pain or other history convincing for a primary coronary event, and multiple secondary causes of troponin elevation not unexpected in this patient with significant chronic coronary disease, I would not treat this as ACS at this time.  - May be helpful to repeat troponin here to ensure no significant interval change - Would stop heparin if no significant uptrend - Consider mesenteric ischemia in work-up of his abdominal pain  Atrial fibrillation/flutter: Bradycardic on current diltiazem regimen. On long-term anticoagulation with Eliquis. - Stop diltiazem. If need for rate control, would use metoprolol tartrate BID starting at low dose.  - Continue Eliquis.  Signed, Marykay Lex, MD 07/13/2018, 4:22 AM  For questions or updates, please contact   Please consult [email or web address] for contact info under Cardiology/STEMI.

## 2018-07-14 DIAGNOSIS — K559 Vascular disorder of intestine, unspecified: Secondary | ICD-10-CM

## 2018-07-14 DIAGNOSIS — K551 Chronic vascular disorders of intestine: Secondary | ICD-10-CM

## 2018-07-14 DIAGNOSIS — I771 Stricture of artery: Secondary | ICD-10-CM

## 2018-07-14 LAB — COMPREHENSIVE METABOLIC PANEL
ALT: 23 U/L (ref 0–44)
AST: 46 U/L — ABNORMAL HIGH (ref 15–41)
Albumin: 2.4 g/dL — ABNORMAL LOW (ref 3.5–5.0)
Alkaline Phosphatase: 52 U/L (ref 38–126)
Anion gap: 11 (ref 5–15)
BUN: 30 mg/dL — ABNORMAL HIGH (ref 8–23)
CO2: 20 mmol/L — ABNORMAL LOW (ref 22–32)
Calcium: 8 mg/dL — ABNORMAL LOW (ref 8.9–10.3)
Chloride: 99 mmol/L (ref 98–111)
Creatinine, Ser: 1.24 mg/dL (ref 0.61–1.24)
GFR calc Af Amer: >60 mL/min (ref >60)
GFR calc non Af Amer: 57 mL/min — ABNORMAL LOW (ref >60)
Glucose, Bld: 102 mg/dL — ABNORMAL HIGH (ref 70–99)
Potassium: 3.1 mmol/L — ABNORMAL LOW (ref 3.5–5.1)
Sodium: 130 mmol/L — ABNORMAL LOW (ref 135–145)
Total Bilirubin: 0.9 mg/dL (ref 0.3–1.2)
Total Protein: 5.2 g/dL — ABNORMAL LOW (ref 6.5–8.1)

## 2018-07-14 LAB — CBC WITH DIFFERENTIAL/PLATELET
Abs Immature Granulocytes: 0.1 10*3/uL — ABNORMAL HIGH (ref 0.00–0.07)
Basophils Absolute: 0 10*3/uL (ref 0.0–0.1)
Basophils Relative: 0 %
Eosinophils Absolute: 0.1 10*3/uL (ref 0.0–0.5)
Eosinophils Relative: 1 %
HCT: 21.3 % — ABNORMAL LOW (ref 39.0–52.0)
Hemoglobin: 7.5 g/dL — ABNORMAL LOW (ref 13.0–17.0)
Immature Granulocytes: 1 %
Lymphocytes Relative: 5 %
Lymphs Abs: 0.7 10*3/uL (ref 0.7–4.0)
MCH: 33.3 pg (ref 26.0–34.0)
MCHC: 35.2 g/dL (ref 30.0–36.0)
MCV: 94.7 fL (ref 80.0–100.0)
Monocytes Absolute: 0.9 10*3/uL (ref 0.1–1.0)
Monocytes Relative: 7 %
Neutro Abs: 11.6 10*3/uL — ABNORMAL HIGH (ref 1.7–7.7)
Neutrophils Relative %: 86 %
Platelets: 208 10*3/uL (ref 150–400)
RBC: 2.25 MIL/uL — ABNORMAL LOW (ref 4.22–5.81)
RDW: 15 % (ref 11.5–15.5)
WBC: 13.4 10*3/uL — ABNORMAL HIGH (ref 4.0–10.5)
nRBC: 0 % (ref 0.0–0.2)

## 2018-07-14 LAB — MAGNESIUM: Magnesium: 1.8 mg/dL (ref 1.7–2.4)

## 2018-07-14 LAB — APTT
aPTT: 47 seconds — ABNORMAL HIGH (ref 24–36)
aPTT: 51 seconds — ABNORMAL HIGH (ref 24–36)

## 2018-07-14 LAB — HEPARIN LEVEL (UNFRACTIONATED)
Heparin Unfractionated: >2.2 IU/mL — ABNORMAL HIGH (ref 0.30–0.70)
Heparin Unfractionated: >2.2 IU/mL — ABNORMAL HIGH (ref 0.30–0.70)

## 2018-07-14 LAB — FERRITIN: Ferritin: 122 ng/mL (ref 24–336)

## 2018-07-14 MED ORDER — TRAZODONE HCL 50 MG PO TABS
50.0000 mg | ORAL_TABLET | Freq: Once | ORAL | Status: AC
  Administered 2018-07-15: 50 mg via ORAL
  Filled 2018-07-14: qty 1

## 2018-07-14 MED ORDER — ASPIRIN EC 81 MG PO TBEC
81.0000 mg | DELAYED_RELEASE_TABLET | Freq: Every day | ORAL | Status: DC
  Administered 2018-07-14 – 2018-07-21 (×8): 81 mg via ORAL
  Filled 2018-07-14 (×8): qty 1

## 2018-07-14 MED ORDER — POTASSIUM CHLORIDE CRYS ER 20 MEQ PO TBCR
40.0000 meq | EXTENDED_RELEASE_TABLET | ORAL | Status: AC
  Administered 2018-07-14 (×2): 40 meq via ORAL
  Filled 2018-07-14 (×2): qty 2

## 2018-07-14 NOTE — Progress Notes (Signed)
Urine culture from [facility] faxed, positive for Enterococcus Facealis, report placed in chart and Dr. Starla Link notified of results.

## 2018-07-14 NOTE — Progress Notes (Signed)
[place] for Heparin Indication: chest pain/ACS  Allergies  Allergen Reactions  . Codeine Nausea And Vomiting  . Sulfa Antibiotics Rash  . Sulfonamide Derivatives Rash    Patient Measurements: Height: 5\' 5"  (165.1 cm) Weight: 128 lb 12 oz (58.4 kg) IBW/kg (Calculated) : 61.5 Heparin Dosing Weight: 58kg  Vital Signs: Temp: 98 F (36.7 C) (05/08 2025) Temp Source: Oral (05/08 2025) BP: 103/63 (05/09 0022) Pulse Rate: 86 (05/09 0022)  Labs: Recent Labs    07/12/18 2336 07/13/18 0354 07/13/18 0733 07/13/18 1338 07/13/18 2005 07/13/18 2309  HGB 7.9* 8.7*  --   --   --   --   HCT 23.1* 25.3*  --   --   --   --   PLT 222 236  --   --   --   --   APTT  --  50*  --  64*  --  67*  HEPARINUNFRC  --  >2.20*  --   --   --   --   CREATININE 0.99 1.20  --   --   --   --   TROPONINI  --   --  5.16* 4.90* 4.00*  --     Estimated Creatinine Clearance: 44.6 mL/min (by C-G formula based on SCr of 1.2 mg/dL).   Medical History: Past Medical History:  Diagnosis Date  . Anemia   . Angina   . Atrial fibrillation ([place])   . Blood transfusion   . Bronchitis   . Bulging discs    "8 of them; thoracic, lumbar, sacral area"  . Chronic back pain   . Colon polyps   . Complication of anesthesia   . Coronary artery disease 01/24/11   Successful PCI long segmental stensois  vein graft to the CX marginal vessel-graft previously stented prox. 3.0x77mm TAXUS stent mid seg 3.0x33mm TAXUS stent now tandem stens of 3.0x65mm & 3.0x67mm placed with the seg. 50,80 & 99% stenosis reduced to 0%  . DDD (degenerative disc disease)   . Dysrhythmia    "PAC's and PVC's"  . Esophageal dilatation    "have had it done 7 times; last time ~ 2001"  . Esophageal stricture   . GERD (gastroesophageal reflux disease)   . Headache(784.0)   . Hiatal hernia   . Hyperlipidemia   . Myocardial infarction ([place]) 1991  . Myocardial infarction [facility]) 2005   "had 3 heart attacks  this year"  . Paroxysmal atrial fibrillation ([facility])   . Pyloric stenosis   . RBBB   . Renal artery stenosis ([facility])    hhistory of right renal artery stenting and re-intervention for "in-stent restenosis.  . S/P CABG (coronary artery bypass graft) [place]  . Shortness of breath    "when I had the heart attacks"  . Subclavian artery stenosis, left ([place])   . Ulcer    "small; Dr. Henrene Pastor found it 02/2010"    Assessment: 87 yoM with hx CAD admitted with fever and found to have elevated troponins. Pt started on heparin 750 units/hr prior to transfer to [facility] from OSH ED ~2030 per RN report. Of note, pt also has hx of AFib and had been on Eliquis with last dose at some point yesterday.  5/9 AM update: aPTT is therapeutic at 67 on 1000 units/hr of heparin, using aPTT to dose given apixaban influence on heparin levels, no issues per RN.   Goal of Therapy:  Heparin level 0.3-0.7 units/ml aPTT 66-102 seconds Monitor platelets by anticoagulation protocol: Yes   Plan:  -Cont heparin at 1000 units/hr -Confirmatory aPTT with AM labs -Daily CBC/HL/aPTT until heparin level and aPTT correlate  -Monitor for bleeding  Narda Bonds, PharmD, BCPS Clinical Pharmacist Phone: [phone]

## 2018-07-14 NOTE — Progress Notes (Addendum)
[place] for Heparin Indication: chest pain/ACS  Allergies  Allergen Reactions  . Codeine Nausea And Vomiting  . Sulfa Antibiotics Rash  . Sulfonamide Derivatives Rash    Patient Measurements: Height: 5\' 5"  (165.1 cm) Weight: 131 lb 9.8 oz (59.7 kg) IBW/kg (Calculated) : 61.5 Heparin Dosing Weight: 58kg  Vital Signs: Temp: 98 F (36.7 C) (05/09 0433) Temp Source: Oral (05/09 0433) BP: 102/54 (05/09 0433) Pulse Rate: 79 (05/09 0433)  Labs: Recent Labs    07/12/18 2336  07/13/18 0354 07/13/18 0733 07/13/18 1338 07/13/18 2005 07/13/18 2309 07/14/18 0337  HGB 7.9*  --  8.7*  --   --   --   --  7.5*  HCT 23.1*  --  25.3*  --   --   --   --  21.3*  PLT 222  --  236  --   --   --   --  208  APTT  --    < > 50*  --  64*  --  67* 47*  HEPARINUNFRC  --   --  >2.20*  --   --   --   --  >2.20*  CREATININE 0.99  --  1.20  --   --   --   --  1.24  TROPONINI  --   --   --  5.16* 4.90* 4.00*  --   --    < > = values in this interval not displayed.    Estimated Creatinine Clearance: 44.1 mL/min (by C-G formula based on SCr of 1.24 mg/dL).   Medical History: Past Medical History:  Diagnosis Date  . Anemia   . Angina   . Atrial fibrillation ([place])   . Blood transfusion   . Bronchitis   . Bulging discs    "8 of them; thoracic, lumbar, sacral area"  . Chronic back pain   . Colon polyps   . Complication of anesthesia   . Coronary artery disease 01/24/11   Successful PCI long segmental stensois  vein graft to the CX marginal vessel-graft previously stented prox. 3.0x44mm TAXUS stent mid seg 3.0x59mm TAXUS stent now tandem stens of 3.0x47mm & 3.0x30mm placed with the seg. 50,80 & 99% stenosis reduced to 0%  . DDD (degenerative disc disease)   . Dysrhythmia    "PAC's and PVC's"  . Esophageal dilatation    "have had it done 7 times; last time ~ 2001"  . Esophageal stricture   . GERD (gastroesophageal reflux disease)   . Headache(784.0)    . Hiatal hernia   . Hyperlipidemia   . Myocardial infarction ([place]) 1991  . Myocardial infarction [facility]) 2005   "had 3 heart attacks this year"  . Paroxysmal atrial fibrillation (H[facility])   . Pyloric stenosis   . RBBB   . Renal artery stenosis ([facility])    hhistory of right renal artery stenting and re-intervention for "in-stent restenosis.  . S/P CABG (coronary artery bypass graft) [place]  . Shortness of breath    "when I had the heart attacks"  . Subclavian artery stenosis, left ([place])   . Ulcer    "small; Dr. Henrene Pastor found it 02/2010"    Assessment: 38 yoM with hx CAD admitted with fever and found to have elevated troponins. Pt started on heparin 750 units/hr prior to transfer to [facility] from OSH ED ~2030 per RN report. Of note, pt also has hx of AFib and  had been on Eliquis with last dose on 5/6 at 0900.  APTT subtherapeutic at 47. Anti-Xa level remains elevate from the effects of Eliquis. Will continue to dose off of aPTT. Hemoglobin low at 7.5, platelets WNL. No signs/symptoms of bleeding noted and confirmed with nursing. No issues with infusion noted.   Goal of Therapy:  Heparin level 0.3-0.7 units/ml aPTT 66-102 seconds Monitor platelets by anticoagulation protocol: Yes   Plan:  -Increase heparin to 1000 units/hr -Confirmatory aPTT in 8 hours  -Daily CBC/HL/aPTT until heparin level and aPTT correlate  -Monitor for bleeding  Azzie Roup D PGY1 Pharmacy Resident  Phone [phone] Please use AMION for clinical pharmacists numbers  07/14/2018      7:26 AM

## 2018-07-14 NOTE — Progress Notes (Signed)
[place] for Heparin Indication: chest pain/ACS  Allergies  Allergen Reactions  . Codeine Nausea And Vomiting  . Sulfa Antibiotics Rash  . Sulfonamide Derivatives Rash    Patient Measurements: Height: 5\' 5"  (165.1 cm) Weight: 131 lb 9.8 oz (59.7 kg) IBW/kg (Calculated) : 61.5 Heparin Dosing Weight: 58kg  Vital Signs: Temp: 98 F (36.7 C) (05/09 1144) Temp Source: Oral (05/09 1144) BP: 107/57 (05/09 1547) Pulse Rate: 80 (05/09 1144)  Labs: Recent Labs    07/12/18 2336  07/13/18 0354 07/13/18 0733 07/13/18 1338 07/13/18 2005 07/13/18 2309 07/14/18 0337 07/14/18 1637  HGB 7.9*  --  8.7*  --   --   --   --  7.5*  --   HCT 23.1*  --  25.3*  --   --   --   --  21.3*  --   PLT 222  --  236  --   --   --   --  208  --   APTT  --    < > 50*  --  64*  --  67* 47* 51*  HEPARINUNFRC  --   --  >2.20*  --   --   --   --  >2.20*  --   CREATININE 0.99  --  1.20  --   --   --   --  1.24  --   TROPONINI  --   --   --  5.16* 4.90* 4.00*  --   --   --    < > = values in this interval not displayed.    Estimated Creatinine Clearance: 44.1 mL/min (by C-G formula based on SCr of 1.24 mg/dL).    Assessment: 59 yoM with hx CAD admitted with fever and found to have elevated troponins. Pt started on heparin 750 units/hr prior to transfer to [facility] from OSH ED ~2030 per RN report. Of note, pt also has hx of AFib and had been on Eliquis with last dose on 5/6 at 0900.  APTT subtherapeutic  Goal of Therapy:  Heparin level 0.3-0.7 units/ml aPTT 66-102 seconds Monitor platelets by anticoagulation protocol: Yes   Plan:  -Increase heparin to 1200 units/hr -Daily CBC/HL/aPTT until heparin level and aPTT correlate  -Monitor for bleeding  Thank you Anette Guarneri, PharmD Please use AMION for clinical pharmacists numbers  07/14/2018      5:11 PM

## 2018-07-14 NOTE — Progress Notes (Signed)
Progress Note  Patient Name: [name] Date of Encounter: 07/14/2018  Primary Cardiologist: Shelva Majestic, MD  Subjective   No chest pain or shortness of breath this morning.  Abdominal pain better overall.  No nausea or emesis.  No palpitations.  Inpatient Medications    Scheduled Meds:  diltiazem  60 mg Oral Q6H   dutasteride  0.5 mg Oral Daily   ezetimibe  10 mg Oral Daily   metoprolol tartrate  75 mg Oral BID   pantoprazole  40 mg Oral Daily   polyethylene glycol  17 g Oral BID   potassium chloride  40 mEq Oral Q4H   pravastatin  40 mg Oral q1800   ranolazine  500 mg Oral BID   senna-docusate  1 tablet Oral BID   Continuous Infusions:  sodium chloride 50 mL/hr at 07/14/18 0735   ceFEPime (MAXIPIME) IV 2 g (07/13/18 2136)   heparin 1,000 Units/hr (07/14/18 0807)   vancomycin 1,000 mg (07/13/18 2359)   PRN Meds: acetaminophen, bisacodyl, loratadine, methocarbamol, nitroGLYCERIN, ondansetron **OR** ondansetron (ZOFRAN) IV, sodium phosphate, torsemide   Vital Signs    Vitals:   07/14/18 0022 07/14/18 0433 07/14/18 0730 07/14/18 0851  BP: 103/63 (!) 102/54 (!) 96/56 (!) 94/45  Pulse: 86 79 83   Resp: 20 15 18    Temp:  98 F (36.7 C)  98.7 F (37.1 C)  TempSrc:  Oral  Oral  SpO2: 98% 98% 98%   Weight:  59.7 kg    Height:        Intake/Output Summary (Last 24 hours) at 07/14/2018 0950 Last data filed at 07/14/2018 0735 Gross per 24 hour  Intake 0 ml  Output 3230 ml  Net -3230 ml   Filed Weights   07/12/18 2054 07/14/18 0433  Weight: 58.4 kg 59.7 kg    Telemetry    Apparent atrial fibrillation.  Personally reviewed.  ECG    Tracing from 07/13/2018 is uninterpretable.  Personally reviewed.  Physical Exam   GEN:  Chronically ill-appearing male.  No acute distress.   Neck: No JVD. Cardiac:  Irregular, 2/6 systolic murmur, no gallop.  Respiratory: Nonlabored. Clear to auscultation bilaterally. GI: Soft, mildly tender without rebound,  bowel sounds present. MS: No edema; No deformity. Neuro:  Nonfocal. Psych: Alert and oriented x 3. Normal affect.  Labs    Chemistry Recent Labs  Lab 07/12/18 2336 07/13/18 0354 07/14/18 0337  NA 130* 128* 130*  K 3.5 4.0 3.1*  CL 100 93* 99  CO2 20* 21* 20*  GLUCOSE 123* 158* 102*  BUN 24* 28* 30*  CREATININE 0.99 1.20 1.24  CALCIUM 7.9* 8.6* 8.0*  PROT 5.4* 5.7* 5.2*  ALBUMIN 2.7* 2.8* 2.4*  AST 31 44* 46*  ALT 14 17 23   ALKPHOS 56 60 52  BILITOT 0.9 0.8 0.9  GFRNONAA >60 59* 57*  GFRAA >60 >60 >60  ANIONGAP 10 14 11      Hematology Recent Labs  Lab 07/12/18 2336 07/13/18 0354 07/14/18 0337  WBC 19.4* 23.2* 13.4*  RBC 2.40* 2.59* 2.25*  HGB 7.9* 8.7* 7.5*  HCT 23.1* 25.3* 21.3*  MCV 96.3 97.7 94.7  MCH 32.9 33.6 33.3  MCHC 34.2 34.4 35.2  RDW 15.1 15.4 15.0  PLT 222 236 208    Cardiac Enzymes Recent Labs  Lab 07/13/18 0733 07/13/18 1338 07/13/18 2005  TROPONINI 5.16* 4.90* 4.00*   No results for input(s): TROPIPOC in the last 168 hours.    Radiology    Dg Abd  Portable 2v  Result Date: 07/13/2018 CLINICAL DATA:  Centralized abdominal pain for the past 5 days. EXAM: PORTABLE ABDOMEN - 2 VIEW COMPARISON:  01/19/2010 FINDINGS: Large stool burden within the cecum and ascending colon. This finding is associated with marked distension of the colon with the cecum measuring approximately they are. Mild gaseous distention of the upstream small bowel with index loop of small bowel within the mid hemiabdomen measuring approximately 3 cm. No pneumoperitoneum, pneumatosis or portal venous gas. Limited visualization of the lower thorax demonstrates sequela prior median sternotomy. Mild scoliotic curvature of the thoracolumbar spine with associated moderate multilevel lumbar spine DDD, incompletely evaluated. Post right femoral neck dynamic screw fixation, incompletely evaluated. IMPRESSION: Large colonic stool burden with findings most suggestive of adynamic ileus  though conceivably a distal colonic obstruction could result in a similar appearance. Clinical correlation is advised. Electronically Signed   By: Sandi Mariscal M.D.   On: 07/13/2018 09:30   Ct Angio Abd/pel W/ And/or W/o  Result Date: 07/13/2018 : Alerts CLINICAL DATA:  Chest pain, shortness of breath, abdominal pain x2 days, weight loss EXAM: CTA ABDOMEN AND PELVIS WITH CONTRAST TECHNIQUE: Multidetector CT imaging of the abdomen and pelvis was performed using the standard protocol during bolus administration of intravenous contrast. Multiplanar reconstructed images and MIPs were obtained and reviewed to evaluate the vascular anatomy. CONTRAST:  174mL OMNIPAQUE IOHEXOL 350 MG/ML SOLN COMPARISON:  07/12/2018, 08/07/2009 FINDINGS: VASCULAR Coronary calcifications. Aorta: Tortuous. Moderate scattered calcified atheromatous plaque. Minimal eccentric nonocclusive mural thrombus in the suprarenal segment. No aneurysm, dissection, or stenosis. Celiac: Short-segment origin stenosis of probable hemodynamic significance, ectatic distally. SMA: Partially calcified ostial plaque/thrombus extending over length of approximately 1.7 cm, resulting in short segment relatively high-grade stenosis. Distally there is a tandem lesion of eccentric partially calcified plaque without high-grade stenosis. Classic distal branch anatomy. Renals: Patent right renal ostial stent with a degree of in stent restenosis of indeterminate hemodynamic severity, patent distally. Duplicated left renal arteries, superior dominant and widely patent. There is calcified plaque at the origin of the more diminutive inferior left renal artery resulting in stenosis of indeterminate hemodynamic significance. IMA: Short-segment origin occlusion or high-grade stenosis, patent distally reconstituted by visceral collaterals. Inflow: Scattered calcified plaque throughout the iliac arterial systems. Linear intraluminal filling defect in the distal right external  iliac artery possibly short dissection flap of indeterminate hemodynamic significance. Origin stenosis of the right internal iliac artery with fusiform dilatation poststenotic dilatation up to 1.3 cm diameter. Proximal Outflow: Atheromatous common femoral arteries. Origin occlusion of bilateral SFA. Deep femoral branches patent. Veins: Patent hepatic veins, portal vein, SMV, splenic vein, bilateral renal veins, IVC and iliac venous system. No venous pathology identified. Review of the MIP images confirms the above findings. NON-VASCULAR Lower chest: Small pleural effusions right greater than left. Atelectasis/consolidation in the lung bases right greater than left. Previous median sternotomy. Hepatobiliary: Periportal edema. Gallbladder physiologically distended. No discrete liver lesion. No biliary ductal dilatation. Pancreas: Unremarkable. No pancreatic ductal dilatation or surrounding inflammatory changes. Spleen: Normal in size without focal abnormality. Adrenals/Urinary Tract: Normal adrenals. Unremarkable kidneys. No hydronephrosis. Urinary bladder incompletely distended, moderately thick-walled. Stomach/Bowel: Stomach is nondistended. Small bowel is nondilated. Moderate colonic fecal material without dilatation. There is circumferential wall thickening suspected in the descending and sigmoid colon with incomplete luminal distention. No definite adjacent inflammatory/edematous change. Lymphatic: No abdominal or pelvic adenopathy. Reproductive: Prostate is unremarkable. Other: Trace pelvic and perihepatic ascites. No free air. Musculoskeletal: Spondylitic changes throughout the lumbar spine. Sliding screw  and IM rod fixation across a comminuted right femoral intertrochanteric fracture. No acute fracture or worrisome bone lesion. IMPRESSION: VASCULAR 1. Significant origin stenoses of celiac axis, SMA, and IMA; resultant increased risk of occlusive mesenteric ischemia. 2. Short-segment dissection flap in the  distal right external iliac artery, of indeterminate hemodynamic significance. Correlate with clinical symptomatology and ABIs. 3. Patent right renal artery ostial stent with a degree of in-stent stenosis of indeterminate hemodynamic significance. 4. Stenosis of the inferior left renal artery of indeterminate hemodynamic significance. 5. Proximal stenosis of the right internal iliac artery, with 1.3 cm poststenotic dilatation. NON-VASCULAR:. 1. Circumferential wall thickening in descending and sigmoid colon consistent with colitis, possibly ischemic given the vascular findings. 2. Small bilateral pleural effusions with bibasilar atelectasis/consolidation. 3. Small amount of abdominal ascites. 4. Thick-walled urinary bladder suggesting a degree of bladder outlet obstruction. 5. Postop and degenerative changes as above. Electronically Signed   By: Lucrezia Europe M.D.   On: 07/13/2018 17:09    Cardiac Studies   Echocardiogram 08/24/2016: Study Conclusions  - Left ventricle: The cavity size was normal. Wall thickness was   increased in a pattern of moderate LVH. Systolic function was   normal. The estimated ejection fraction was in the range of 55%   to 60%. Wall motion was normal; there were no regional wall   motion abnormalities. The study is not technically sufficient to   allow evaluation of LV diastolic function. - Mitral valve: Calcified annulus. Mildly thickened leaflets .   There was mild regurgitation. - Left atrium: The atrium was normal in size. - Right atrium: The atrium was mildly dilated. - Tricuspid valve: There was mild regurgitation. - Pulmonary arteries: PA peak pressure: 48 mm Hg (S). - Inferior vena cava: The vessel was dilated. The respirophasic   diameter changes were blunted (< 50%), consistent with elevated   central venous pressure.  Impressions:  - Compared to a prior study in 2016, the LVEF is stable at 55-60%.  Patient Profile     73 y.o. male history of CAD  status post CABG and redo operation most recently 1996, multiple PCI's, carotid arterectomy and renal artery stenting, atrial fibrillation, recent right hip fracture, now presenting with fever, abdominal pain, elevated troponin I, ultimately with findings of mesenteric ischemia.  Assessment & Plan    1.  Troponin I elevation in the absence of chest pain.  ECG from yesterday is uninterpretable and will be repeated.  Peak troponin I was 5.16, down to 4.0.  Although in range for NSTEMI, this likely is a type II event in association with demand ischemia.  He is currently not on aspirin having recently been on Eliquis as an outpatient.  On Zetia and Pravachol.  2.  Mesenteric ischemia with ongoing work-up by Dr. Trula Slade and potentially stent intervention next week according to the patient.  3.  Recent fevers, SARS coronavirus 2 negative.  Being managed for healthcare associated pneumonia on antibiotics.  4.  Multivessel CAD status post CABG and redo operation, being managed medically which includes Ranexa, beta-blocker, and calcium channel blocker.  5.  Anemia with hemoglobin 7.5, likely blood loss and to some degree chronic.  6.  Recent right hip fracture status post surgical repair.  7.  GERD on PPI.  8.  Known PAD.  Medically complex patient with probable type II NSTEMI (demand ischemia), no active chest pain and known multivessel CAD status post CABG and redo operation.  ECG will be repeated.  Start aspirin while he is  off Eliquis.  Continue Pravachol and Zetia.  Heart rate is adequately controlled on combination of beta-blocker and calcium channel blocker.  He is a relatively high risk patient from a surgical perspective, but there is no clear indication for stress testing or cardiac catheterization to ameliorate this risk.  Signed, Rozann Lesches, MD  07/14/2018, 9:50 AM

## 2018-07-14 NOTE — Progress Notes (Signed)
Patient ID: [name], male   DOB: October 24, 1945, 73 y.o.   MRN: [id number]  PROGRESS NOTE    [name]  YQM:[id number] DOB: [birth year] DOA: 07/12/2018 PCP: Garwin Brothers, MD   Brief Narrative:  73 year old male with history of coronary artery disease status post CABG x2 in 1984 and 1996, subsequent MI with multiple PCI's, peripheral vascular disease status post CEA in 1906 and renal artery stenting in 2005, paroxysmal A. fib on Eliquis, GERD and recent right hip fracture status post repair at [facility] and subsequent discharged on 07/07/2018 was transferred from [facility] for fever, cough and shortness of breath.  COVID-19 testing was negative.  Troponin was 2.70.  CT angiogram of the chest showed no PE but areas of mild mucus impaction in the right lower lobe along with severe stenosis of the SMA origin.  Cardiology was consulted and patient was transferred to [facility] and started on heparin drip.  He was also started on broad-spectrum antibiotics.  He was also found to have severe SMA stenosis.  Vascular surgery was consulted.  Assessment & Plan:   Principal Problem:   NSTEMI (non-ST elevated myocardial infarction) [facility]) Active Problems:   Essential hypertension   Hx of CABG '84, '96, last PCI 2012   GASTROESOPHAGEAL REFLUX DISEASE   PVD (peripheral vascular disease) ([place])   Carotid disease, bilateral ([place])   Hyperlipidemia LDL goal <70   Paroxysmal atrial fibrillation (HCC)   Coronary artery disease   Fever   Lobar pneumonia (HCC)  Positive troponin/probable non-STEMI in a patient with known history of coronary artery disease/CABG and history of multiple PCI's -Troponin on presentation was 2.7.  Troponin peaked to 5.16 and is currently trending down.  Last troponin last night was 4.0.  -Patient never had any chest pain.  -Cardiology following.  Continue medical management for now.  Continue heparin drip.   Probable healthcare associated pneumonia -With concern  for gram-negative versus MRSA pneumonia -CT angiogram of the chest showed no PE but areas of mild mucus impaction in the right lower lobe -COVID-19 testing negative -Currently on room air -Chest negative so far. -Currently on cefepime and vancomycin.  Will discontinue vancomycin. -We will get SLP evaluation as well.  Leukocytosis -From above.  Improving  Hyponatremia -From poor oral intake.  Improving.  Decrease normal saline to 50 cc an hour.  Hypokalemia -Replace.  Repeat a.m. labs  Constipation -Continue current laxative regimen.  Patient had multiple bowel movements yesterday.  Abdominal pain has much improved.  Paroxysmal A. fib with RVR -Currently rate controlled.  Continue Cardizem and beta-blocker if blood pressure allows.  Continue heparin drip.  Hold Eliquis for now.  Cardiology following  Significant origin stenosis of the celiac axis, SMA and IMA with probable ischemic colitis -Vascular surgery following. -CTA of the abdomen and pelvis showed significant findings as above.  Will follow further vascular surgery recommendations.  Continue antibiotics  Peripheral vascular disease -Continue with statin  GERD -Continue with PPIs  Hyperlipidemia--continue with statin  Chronic back pain -Continue home regimen of pain medications  Right hip fracture status post recent right hip surgery -Right hip incisional wound looks fine.  No evidence of hip infection.  Outpatient follow-up with orthopedics   DVT prophylaxis: Heparin drip Code Status: Full Family Communication: None at bedside Disposition Plan: Depends on clinical outcome  Consultants: Cardiology/vascular surgery  Procedures: None  Antimicrobials: Vancomycin and cefepime from 07/12/2018 onwards   Subjective: Patient seen and examined at bedside.  She is  still having intermittent abdominal pain but much better compared to yesterday.  He had multiple bowel movements yesterday.  No complaints of overnight  fever or worsening cough or shortness of breath.  Objective: Vitals:   07/14/18 0022 07/14/18 0433 07/14/18 0730 07/14/18 0851  BP: 103/63 (!) 102/54 (!) 96/56 (!) 94/45  Pulse: 86 79 83   Resp: 20 15 18    Temp:  98 F (36.7 C)  98.7 F (37.1 C)  TempSrc:  Oral  Oral  SpO2: 98% 98% 98%   Weight:  59.7 kg    Height:        Intake/Output Summary (Last 24 hours) at 07/14/2018 0945 Last data filed at 07/14/2018 0735 Gross per 24 hour  Intake 0 ml  Output 3230 ml  Net -3230 ml   Filed Weights   07/12/18 2054 07/14/18 0433  Weight: 58.4 kg 59.7 kg    Examination:  General exam: No acute distress. Respiratory system: Bilateral decreased breath sounds at bases with some scattered crackles.  No wheezing Cardiovascular system: S1 & S2 heard, rate controlled Gastrointestinal system: Abdomen is slightly distended, soft and nontender.  Bowel sounds heard  extremities: No cyanosis, edema.  Right hip incision looks clean without any drainage  Central nervous system: Alert and oriented. No focal neurological deficits. Moving extremities Skin: Ecchymosis around the right hip region  psychiatry: Flat affect.    Data Reviewed: I have personally reviewed following labs and imaging studies  CBC: Recent Labs  Lab 07/12/18 2336 07/13/18 0354 07/14/18 0337  WBC 19.4* 23.2* 13.4*  NEUTROABS 17.3*  --  11.6*  HGB 7.9* 8.7* 7.5*  HCT 23.1* 25.3* 21.3*  MCV 96.3 97.7 94.7  PLT 222 236 856   Basic Metabolic Panel: Recent Labs  Lab 07/12/18 2336 07/13/18 0354 07/14/18 0337  NA 130* 128* 130*  K 3.5 4.0 3.1*  CL 100 93* 99  CO2 20* 21* 20*  GLUCOSE 123* 158* 102*  BUN 24* 28* 30*  CREATININE 0.99 1.20 1.24  CALCIUM 7.9* 8.6* 8.0*  MG  --   --  1.8   GFR: Estimated Creatinine Clearance: 44.1 mL/min (by C-G formula based on SCr of 1.24 mg/dL). Liver Function Tests: Recent Labs  Lab 07/12/18 2336 07/13/18 0354 07/14/18 0337  AST 31 44* 46*  ALT 14 17 23   ALKPHOS 56 60 52    BILITOT 0.9 0.8 0.9  PROT 5.4* 5.7* 5.2*  ALBUMIN 2.7* 2.8* 2.4*   No results for input(s): LIPASE, AMYLASE in the last 168 hours. No results for input(s): AMMONIA in the last 168 hours. Coagulation Profile: No results for input(s): INR, PROTIME in the last 168 hours. Cardiac Enzymes: Recent Labs  Lab 07/13/18 0733 07/13/18 1338 07/13/18 2005  TROPONINI 5.16* 4.90* 4.00*   BNP (last 3 results) No results for input(s): PROBNP in the last 8760 hours. HbA1C: No results for input(s): HGBA1C in the last 72 hours. CBG: No results for input(s): GLUCAP in the last 168 hours. Lipid Profile: No results for input(s): CHOL, HDL, LDLCALC, TRIG, CHOLHDL, LDLDIRECT in the last 72 hours. Thyroid Function Tests: Recent Labs    07/12/18 2336  TSH 0.388   Anemia Panel: Recent Labs    07/14/18 0337  FERRITIN 122   Sepsis Labs: Recent Labs  Lab 07/13/18 1009 07/13/18 1338 07/13/18 1711  PROCALCITON 0.78  --   --   LATICACIDVEN 2.3* 1.6 1.8    Recent Results (from the past 240 hour(s))  Culture, blood (Routine X 2) w  Reflex to ID Panel     Status: None (Preliminary result)   Collection Time: 07/12/18 11:36 PM  Result Value Ref Range Status   Specimen Description BLOOD RIGHT ARM  Final   Special Requests   Final    BOTTLES DRAWN AEROBIC ONLY Blood Culture adequate volume   Culture   Final    NO GROWTH < 24 HOURS Performed at [facility], [address], [place], [place] [address]    Report Status PENDING  Incomplete  Culture, blood (Routine X 2) w Reflex to ID Panel     Status: None (Preliminary result)   Collection Time: 07/12/18 11:38 PM  Result Value Ref Range Status   Specimen Description BLOOD RIGHT HAND  Final   Special Requests   Final    BOTTLES DRAWN AEROBIC ONLY Blood Culture adequate volume   Culture   Final    NO GROWTH < 24 HOURS Performed at [facility], [place] [address] [place], [place] [address]    Report Status PENDING  Incomplete  SARS  Coronavirus 2 (CEPHEID- Performed in [place] hospital lab), Hosp Order     Status: None   Collection Time: 07/13/18  9:42 AM  Result Value Ref Range Status   SARS Coronavirus 2 NEGATIVE NEGATIVE Final    Comment: (NOTE) If result is NEGATIVE SARS-CoV-2 target nucleic acids are NOT DETECTED. The SARS-CoV-2 RNA is generally detectable in upper and lower  respiratory specimens during the acute phase of infection. The lowest  concentration of SARS-CoV-2 viral copies this assay can detect is 250  copies / mL. A negative result does not preclude SARS-CoV-2 infection  and should not be used as the sole basis for treatment or other  patient management decisions.  A negative result may occur with  improper specimen collection / handling, submission of specimen other  than nasopharyngeal swab, presence of viral mutation(s) within the  areas targeted by this assay, and inadequate number of viral copies  (<250 copies / mL). A negative result must be combined with clinical  observations, patient history, and epidemiological information. If result is POSITIVE SARS-CoV-2 target nucleic acids are DETECTED. The SARS-CoV-2 RNA is generally detectable in upper and lower  respiratory specimens dur ing the acute phase of infection.  Positive  results are indicative of active infection with SARS-CoV-2.  Clinical  correlation with patient history and other diagnostic information is  necessary to determine patient infection status.  Positive results do  not rule out bacterial infection or co-infection with other viruses. If result is PRESUMPTIVE POSTIVE SARS-CoV-2 nucleic acids MAY BE PRESENT.   A presumptive positive result was obtained on the submitted specimen  and confirmed on repeat testing.  While 2019 novel coronavirus  (SARS-CoV-2) nucleic acids may be present in the submitted sample  additional confirmatory testing may be necessary for epidemiological  and / or clinical management purposes  to  differentiate between  SARS-CoV-2 and other Sarbecovirus currently known to infect humans.  If clinically indicated additional testing with an alternate test  methodology [phone]) is advised. The SARS-CoV-2 RNA is generally  detectable in upper and lower respiratory sp ecimens during the acute  phase of infection. The expected result is Negative. Fact Sheet for Patients:  StrictlyIdeas.no Fact Sheet for Healthcare Providers: BankingDealers.co.za This test is not yet approved or cleared by the [place] FDA and has been authorized for detection and/or diagnosis of SARS-CoV-2 by FDA under an Emergency Use Authorization (EUA).  This EUA will  remain in effect (meaning this test can be used) for the duration of the COVID-19 declaration under Section 564(b)(1) of the Act, 21 U.S.C. section 360bbb-3(b)(1), unless the authorization is terminated or revoked sooner. Performed at [facility], [place] [address] [place], [place] [address]          Radiology Studies: Dg Abd Portable 2v  Result Date: 07/13/2018 CLINICAL DATA:  Centralized abdominal pain for the past 5 days. EXAM: PORTABLE ABDOMEN - 2 VIEW COMPARISON:  01/19/2010 FINDINGS: Large stool burden within the cecum and ascending colon. This finding is associated with marked distension of the colon with the cecum measuring approximately they are. Mild gaseous distention of the upstream small bowel with index loop of small bowel within the mid hemiabdomen measuring approximately 3 cm. No pneumoperitoneum, pneumatosis or portal venous gas. Limited visualization of the lower thorax demonstrates sequela prior median sternotomy. Mild scoliotic curvature of the thoracolumbar spine with associated moderate multilevel lumbar spine DDD, incompletely evaluated. Post right femoral neck dynamic screw fixation, incompletely evaluated. IMPRESSION: Large colonic stool burden with findings most suggestive  of adynamic ileus though conceivably a distal colonic obstruction could result in a similar appearance. Clinical correlation is advised. Electronically Signed   By: Sandi Mariscal M.D.   On: 07/13/2018 09:30   Ct Angio Abd/pel W/ And/or W/o  Result Date: 07/13/2018 : Alerts CLINICAL DATA:  Chest pain, shortness of breath, abdominal pain x2 days, weight loss EXAM: CTA ABDOMEN AND PELVIS WITH CONTRAST TECHNIQUE: Multidetector CT imaging of the abdomen and pelvis was performed using the standard protocol during bolus administration of intravenous contrast. Multiplanar reconstructed images and MIPs were obtained and reviewed to evaluate the vascular anatomy. CONTRAST:  166mL OMNIPAQUE IOHEXOL 350 MG/ML SOLN COMPARISON:  07/12/2018, 08/07/2009 FINDINGS: VASCULAR Coronary calcifications. Aorta: Tortuous. Moderate scattered calcified atheromatous plaque. Minimal eccentric nonocclusive mural thrombus in the suprarenal segment. No aneurysm, dissection, or stenosis. Celiac: Short-segment origin stenosis of probable hemodynamic significance, ectatic distally. SMA: Partially calcified ostial plaque/thrombus extending over length of approximately 1.7 cm, resulting in short segment relatively high-grade stenosis. Distally there is a tandem lesion of eccentric partially calcified plaque without high-grade stenosis. Classic distal branch anatomy. Renals: Patent right renal ostial stent with a degree of in stent restenosis of indeterminate hemodynamic severity, patent distally. Duplicated left renal arteries, superior dominant and widely patent. There is calcified plaque at the origin of the more diminutive inferior left renal artery resulting in stenosis of indeterminate hemodynamic significance. IMA: Short-segment origin occlusion or high-grade stenosis, patent distally reconstituted by visceral collaterals. Inflow: Scattered calcified plaque throughout the iliac arterial systems. Linear intraluminal filling defect in the distal  right external iliac artery possibly short dissection flap of indeterminate hemodynamic significance. Origin stenosis of the right internal iliac artery with fusiform dilatation poststenotic dilatation up to 1.3 cm diameter. Proximal Outflow: Atheromatous common femoral arteries. Origin occlusion of bilateral SFA. Deep femoral branches patent. Veins: Patent hepatic veins, portal vein, SMV, splenic vein, bilateral renal veins, IVC and iliac venous system. No venous pathology identified. Review of the MIP images confirms the above findings. NON-VASCULAR Lower chest: Small pleural effusions right greater than left. Atelectasis/consolidation in the lung bases right greater than left. Previous median sternotomy. Hepatobiliary: Periportal edema. Gallbladder physiologically distended. No discrete liver lesion. No biliary ductal dilatation. Pancreas: Unremarkable. No pancreatic ductal dilatation or surrounding inflammatory changes. Spleen: Normal in size without focal abnormality. Adrenals/Urinary Tract: Normal adrenals. Unremarkable kidneys. No hydronephrosis. Urinary bladder incompletely distended, moderately thick-walled. Stomach/Bowel: Stomach is  nondistended. Small bowel is nondilated. Moderate colonic fecal material without dilatation. There is circumferential wall thickening suspected in the descending and sigmoid colon with incomplete luminal distention. No definite adjacent inflammatory/edematous change. Lymphatic: No abdominal or pelvic adenopathy. Reproductive: Prostate is unremarkable. Other: Trace pelvic and perihepatic ascites. No free air. Musculoskeletal: Spondylitic changes throughout the lumbar spine. Sliding screw and IM rod fixation across a comminuted right femoral intertrochanteric fracture. No acute fracture or worrisome bone lesion. IMPRESSION: VASCULAR 1. Significant origin stenoses of celiac axis, SMA, and IMA; resultant increased risk of occlusive mesenteric ischemia. 2. Short-segment dissection  flap in the distal right external iliac artery, of indeterminate hemodynamic significance. Correlate with clinical symptomatology and ABIs. 3. Patent right renal artery ostial stent with a degree of in-stent stenosis of indeterminate hemodynamic significance. 4. Stenosis of the inferior left renal artery of indeterminate hemodynamic significance. 5. Proximal stenosis of the right internal iliac artery, with 1.3 cm poststenotic dilatation. NON-VASCULAR:. 1. Circumferential wall thickening in descending and sigmoid colon consistent with colitis, possibly ischemic given the vascular findings. 2. Small bilateral pleural effusions with bibasilar atelectasis/consolidation. 3. Small amount of abdominal ascites. 4. Thick-walled urinary bladder suggesting a degree of bladder outlet obstruction. 5. Postop and degenerative changes as above. Electronically Signed   By: Lucrezia Europe M.D.   On: 07/13/2018 17:09        Scheduled Meds:  diltiazem  60 mg Oral Q6H   dutasteride  0.5 mg Oral Daily   ezetimibe  10 mg Oral Daily   metoprolol tartrate  75 mg Oral BID   pantoprazole  40 mg Oral Daily   polyethylene glycol  17 g Oral BID   potassium chloride  40 mEq Oral Q4H   pravastatin  40 mg Oral q1800   ranolazine  500 mg Oral BID   senna-docusate  1 tablet Oral BID   Continuous Infusions:  sodium chloride 50 mL/hr at 07/14/18 0735   ceFEPime (MAXIPIME) IV 2 g (07/13/18 2136)   heparin 1,000 Units/hr (07/14/18 0807)   vancomycin 1,000 mg (07/13/18 2359)     LOS: 2 days        Aline August, MD [facility] 07/14/2018, 9:45 AM

## 2018-07-14 NOTE — Progress Notes (Signed)
Subjective  -   Says his abdominal pain is better   Physical Exam:  Abdomen is soft, some LLQ and suprapubic tenderness  I have reviewed his CTA with the following findings:  VASCULAR  1. Significant origin stenoses of celiac axis, SMA, and IMA; resultant increased risk of occlusive mesenteric ischemia. 2. Short-segment dissection flap in the distal right external iliac artery, of indeterminate hemodynamic significance. Correlate with clinical symptomatology and ABIs. 3. Patent right renal artery ostial stent with a degree of in-stent stenosis of indeterminate hemodynamic significance. 4. Stenosis of the inferior left renal artery of indeterminate hemodynamic significance. 5. Proximal stenosis of the right internal iliac artery, with 1.3 cm poststenotic dilatation.  NON-VASCULAR:.  1. Circumferential wall thickening in descending and sigmoid colon consistent with colitis, possibly ischemic given the vascular findings. 2. Small bilateral pleural effusions with bibasilar atelectasis/consolidation. 3. Small amount of abdominal ascites. 4. Thick-walled urinary bladder suggesting a degree of bladder outlet obstruction. 5. Postop and degenerative changes as above.      Assessment/Plan:   Possible mesenteric ischemia:  Abdominal pain is improved.  CTA shows SMA stenosis.  I think that he needs to have an abdominal angiogram on Monday with possible SMA stenting.  Continue IV hydration.  Continue IV antibiotics  Wells Delorse Shane 07/14/2018 11:14 AM --  Vitals:   07/14/18 0851 07/14/18 1006  BP: (!) 94/45 (!) 111/54  Pulse:  90  Resp:    Temp: 98.7 F (37.1 C)   SpO2:      Intake/Output Summary (Last 24 hours) at 07/14/2018 1114 Last data filed at 07/14/2018 0735 Gross per 24 hour  Intake 0 ml  Output 3230 ml  Net -3230 ml     Laboratory CBC    Component Value Date/Time   WBC 13.4 (H) 07/14/2018 0337   HGB 7.5 (L) 07/14/2018 0337   HGB 11.4 (L)  05/30/2017 1411   HCT 21.3 (L) 07/14/2018 0337   HCT 33.5 (L) 05/30/2017 1411   PLT 208 07/14/2018 0337   PLT 163 05/30/2017 1411    BMET    Component Value Date/Time   NA 130 (L) 07/14/2018 0337   NA 143 05/30/2017 1411   K 3.1 (L) 07/14/2018 0337   CL 99 07/14/2018 0337   CO2 20 (L) 07/14/2018 0337   GLUCOSE 102 (H) 07/14/2018 0337   BUN 30 (H) 07/14/2018 0337   BUN 22 05/30/2017 1411   CREATININE 1.24 07/14/2018 0337   CREATININE 1.12 10/14/2015 1117   CALCIUM 8.0 (L) 07/14/2018 0337   GFRNONAA 57 (L) 07/14/2018 0337   GFRAA >60 07/14/2018 0337    COAG Lab Results  Component Value Date   INR 1.08 01/24/2011   No results found for: PTT  Antibiotics Anti-infectives (From admission, onward)   Start     Dose/Rate Route Frequency Ordered Stop   07/13/18 1000  levofloxacin (LEVAQUIN) IVPB 750 mg  Status:  Discontinued    Note to Pharmacy:  Received Rocephin/Azithromycin at [facility] 5/7   750 mg 100 mL/hr over 90 Minutes Intravenous Every 24 hours 07/13/18 0001 07/13/18 0043   07/13/18 0045  vancomycin (VANCOCIN) IVPB 1000 mg/200 mL premix  Status:  Discontinued     1,000 mg 200 mL/hr over 60 Minutes Intravenous Every 24 hours 07/13/18 0042 07/14/18 1002   07/13/18 0045  ceFEPIme (MAXIPIME) 2 g in sodium chloride 0.9 % 100 mL IVPB     2 g 200 mL/hr over 30 Minutes Intravenous Every 12 hours 07/13/18 0042  Eldridge Abrahams, M.D., [facility] [place] Office: [phone] Pager:  [phone]

## 2018-07-15 DIAGNOSIS — Z951 Presence of aortocoronary bypass graft: Secondary | ICD-10-CM

## 2018-07-15 LAB — BASIC METABOLIC PANEL
Anion gap: 11 (ref 5–15)
BUN: 32 mg/dL — ABNORMAL HIGH (ref 8–23)
CO2: 20 mmol/L — ABNORMAL LOW (ref 22–32)
Calcium: 8.1 mg/dL — ABNORMAL LOW (ref 8.9–10.3)
Chloride: 100 mmol/L (ref 98–111)
Creatinine, Ser: 1.31 mg/dL — ABNORMAL HIGH (ref 0.61–1.24)
GFR calc Af Amer: >60 mL/min (ref >60)
GFR calc non Af Amer: 53 mL/min — ABNORMAL LOW (ref >60)
Glucose, Bld: 124 mg/dL — ABNORMAL HIGH (ref 70–99)
Potassium: 3.8 mmol/L (ref 3.5–5.1)
Sodium: 131 mmol/L — ABNORMAL LOW (ref 135–145)

## 2018-07-15 LAB — CBC
HCT: 20.7 % — ABNORMAL LOW (ref 39.0–52.0)
Hemoglobin: 7.1 g/dL — ABNORMAL LOW (ref 13.0–17.0)
MCH: 32.6 pg (ref 26.0–34.0)
MCHC: 34.3 g/dL (ref 30.0–36.0)
MCV: 95 fL (ref 80.0–100.0)
Platelets: 206 10*3/uL (ref 150–400)
RBC: 2.18 MIL/uL — ABNORMAL LOW (ref 4.22–5.81)
RDW: 15.1 % (ref 11.5–15.5)
WBC: 10.8 10*3/uL — ABNORMAL HIGH (ref 4.0–10.5)
nRBC: 0 % (ref 0.0–0.2)

## 2018-07-15 LAB — HEPARIN LEVEL (UNFRACTIONATED)
Heparin Unfractionated: >2.2 IU/mL — ABNORMAL HIGH (ref 0.30–0.70)
Heparin Unfractionated: >2.2 IU/mL — ABNORMAL HIGH (ref 0.30–0.70)

## 2018-07-15 LAB — APTT
aPTT: >200 seconds (ref 24–36)
aPTT: 116 seconds — ABNORMAL HIGH (ref 24–36)
aPTT: 70 seconds — ABNORMAL HIGH (ref 24–36)

## 2018-07-15 LAB — MAGNESIUM: Magnesium: 1.7 mg/dL (ref 1.7–2.4)

## 2018-07-15 MED ORDER — FUROSEMIDE 10 MG/ML IJ SOLN
40.0000 mg | Freq: Once | INTRAMUSCULAR | Status: AC
  Administered 2018-07-15: 11:00:00 40 mg via INTRAVENOUS
  Filled 2018-07-15: qty 4

## 2018-07-15 NOTE — Progress Notes (Signed)
Patient ID: [name], male   DOB: Nov 03, 1945, 73 y.o.   MRN: [id number]  PROGRESS NOTE    [name]  HQI:[id number] DOB: [birth year] DOA: 07/12/2018 PCP: Garwin Brothers, MD   Brief Narrative:  73 year old male with history of coronary artery disease status post CABG x2 in 1984 and 1996, subsequent MI with multiple PCI's, peripheral vascular disease status post CEA in 1906 and renal artery stenting in 2005, paroxysmal A. fib on Eliquis, GERD and recent right hip fracture status post repair at [facility] and subsequent discharged on 07/07/2018 was transferred from [facility] for fever, cough and shortness of breath.  COVID-19 testing was negative.  Troponin was 2.70.  CT angiogram of the chest showed no PE but areas of mild mucus impaction in the right lower lobe along with severe stenosis of the SMA origin.  Cardiology was consulted and patient was transferred to [facility] and started on heparin drip.  He was also started on broad-spectrum antibiotics.  He was also found to have severe SMA stenosis.  Vascular surgery was consulted.  Assessment & Plan:   Principal Problem:   NSTEMI (non-ST elevated myocardial infarction) [facility]) Active Problems:   Essential hypertension   Hx of CABG '84, '96, last PCI 2012   GASTROESOPHAGEAL REFLUX DISEASE   PVD (peripheral vascular disease) ([place])   Carotid disease, bilateral ([facility])   Hyperlipidemia LDL goal <70   Paroxysmal atrial fibrillation (HCC)   Coronary artery disease   Fever   Lobar pneumonia (HCC)   Mesenteric ischemia (HCC)  Positive troponin/probable non-STEMI in a patient with known history of coronary artery disease/CABG and history of multiple PCI's -Troponin on presentation was 2.7.  Troponin peaked to 5.16 and is currently trending down.  Last troponin last night was 4.0.  -Patient never had any chest pain.  -Cardiology following.  Continue medical management for now.  Continue heparin drip.   Probable healthcare  associated pneumonia -With concern for gram-negative versus MRSA pneumonia -CT angiogram of the chest showed no PE but areas of mild mucus impaction in the right lower lobe -COVID-19 testing negative -Currently on room air.  Complains of mild shortness of breath.  Will DC IV fluids.  Will give 1 dose of IV Lasix. -Chest negative so far. -Currently on cefepime. Off vancomycin. - SLP evaluation.  Leukocytosis -From above.  Resolved  Hyponatremia -From poor oral intake.  Improving.  Stop IV fluids.  Hypokalemia -Improved.  Constipation -Continue current laxative regimen.   Paroxysmal A. fib with RVR -Currently rate controlled.  Continue Cardizem and beta-blocker if blood pressure allows.  Continue heparin drip.  Hold Eliquis for now.  Cardiology following  Significant origin stenosis of the celiac axis, SMA and IMA with probable ischemic colitis -Vascular surgery following. -CTA of the abdomen and pelvis showed significant findings as above.  For probable abdominal angiogram and possible stenting by vascular surgery on Monday.  Continue antibiotics  Chronic anemia Questionable cause.  No signs of bleeding.  Hemoglobin slightly drifting downwards.  Transfuse if hemoglobin is less than 7.  Peripheral vascular disease -Continue with statin  GERD -Continue with PPIs  Hyperlipidemia--continue with statin  Chronic back pain -Continue home regimen of pain medications  Right hip fracture status post recent right hip surgery -Right hip incisional wound looks fine.  No evidence of hip infection.  Outpatient follow-up with orthopedics   DVT prophylaxis: Heparin drip Code Status: Full Family Communication: None at bedside Disposition Plan: Depends on clinical outcome  Consultants:  Cardiology/vascular surgery  Procedures: None  Antimicrobials: cefepime from 07/12/2018 onwards Vancomycin 07/12/2018-07/14/2018   Subjective: Patient seen and examined at bedside.  His abdominal pain  is improving.  He had few bowel movements yesterday.  Complains of mild shortness of breath.  No overnight fever or chest pain.   Objective: Vitals:   07/15/18 0010 07/15/18 0020 07/15/18 0024 07/15/18 0509  BP: (!) 113/57 121/89 121/89 133/65  Pulse:    83  Resp: 20 20  20   Temp:    98 F (36.7 C)  TempSrc:    Oral  SpO2:    100%  Weight:    62.6 kg  Height:        Intake/Output Summary (Last 24 hours) at 07/15/2018 0728 Last data filed at 07/15/2018 0700 Gross per 24 hour  Intake 480 ml  Output 1226 ml  Net -746 ml   Filed Weights   07/12/18 2054 07/14/18 0433 07/15/18 0509  Weight: 58.4 kg 59.7 kg 62.6 kg    Examination:  General exam: No distress. Respiratory system: Bilateral decreased breath sounds at bases with some scattered crackles, mostly in the bases.  No wheezing Cardiovascular system: Rate controlled, S1-S2 heard Gastrointestinal system: Abdomen is slightly distended, soft and nontender.  Bowel sounds heard  extremities: No cyanosis; trace edema.    Data Reviewed: I have personally reviewed following labs and imaging studies  CBC: Recent Labs  Lab 07/12/18 2336 07/13/18 0354 07/14/18 0337 07/15/18 0519  WBC 19.4* 23.2* 13.4* 10.8*  NEUTROABS 17.3*  --  11.6*  --   HGB 7.9* 8.7* 7.5* 7.1*  HCT 23.1* 25.3* 21.3* 20.7*  MCV 96.3 97.7 94.7 95.0  PLT 222 236 208 374   Basic Metabolic Panel: Recent Labs  Lab 07/12/18 2336 07/13/18 0354 07/14/18 0337 07/15/18 0519  NA 130* 128* 130* 131*  K 3.5 4.0 3.1* 3.8  CL 100 93* 99 100  CO2 20* 21* 20* 20*  GLUCOSE 123* 158* 102* 124*  BUN 24* 28* 30* 32*  CREATININE 0.99 1.20 1.24 1.31*  CALCIUM 7.9* 8.6* 8.0* 8.1*  MG  --   --  1.8 1.7   GFR: Estimated Creatinine Clearance: 43 mL/min (A) (by C-G formula based on SCr of 1.31 mg/dL (H)). Liver Function Tests: Recent Labs  Lab 07/12/18 2336 07/13/18 0354 07/14/18 0337  AST 31 44* 46*  ALT 14 17 23   ALKPHOS 56 60 52  BILITOT 0.9 0.8 0.9  PROT  5.4* 5.7* 5.2*  ALBUMIN 2.7* 2.8* 2.4*   No results for input(s): LIPASE, AMYLASE in the last 168 hours. No results for input(s): AMMONIA in the last 168 hours. Coagulation Profile: No results for input(s): INR, PROTIME in the last 168 hours. Cardiac Enzymes: Recent Labs  Lab 07/13/18 0733 07/13/18 1338 07/13/18 2005  TROPONINI 5.16* 4.90* 4.00*   BNP (last 3 results) No results for input(s): PROBNP in the last 8760 hours. HbA1C: No results for input(s): HGBA1C in the last 72 hours. CBG: No results for input(s): GLUCAP in the last 168 hours. Lipid Profile: No results for input(s): CHOL, HDL, LDLCALC, TRIG, CHOLHDL, LDLDIRECT in the last 72 hours. Thyroid Function Tests: Recent Labs    07/12/18 2336  TSH 0.388   Anemia Panel: Recent Labs    07/14/18 0337  FERRITIN 122   Sepsis Labs: Recent Labs  Lab 07/13/18 1009 07/13/18 1338 07/13/18 1711  PROCALCITON 0.78  --   --   LATICACIDVEN 2.3* 1.6 1.8    Recent Results (from the past  240 hour(s))  Culture, blood (Routine X 2) w Reflex to ID Panel     Status: None (Preliminary result)   Collection Time: 07/12/18 11:36 PM  Result Value Ref Range Status   Specimen Description BLOOD RIGHT ARM  Final   Special Requests   Final    BOTTLES DRAWN AEROBIC ONLY Blood Culture adequate volume   Culture   Final    NO GROWTH < 24 HOURS Performed at [facility], [place] [address], [place], [place] [address]    Report Status PENDING  Incomplete  Culture, blood (Routine X 2) w Reflex to ID Panel     Status: None (Preliminary result)   Collection Time: 07/12/18 11:38 PM  Result Value Ref Range Status   Specimen Description BLOOD RIGHT HAND  Final   Special Requests   Final    BOTTLES DRAWN AEROBIC ONLY Blood Culture adequate volume   Culture   Final    NO GROWTH < 24 HOURS Performed at [facility], [place] [address], [place], [place] [address]    Report Status PENDING  Incomplete  SARS Coronavirus 2 (CEPHEID-  Performed in [place] hospital lab), Hosp Order     Status: None   Collection Time: 07/13/18  9:42 AM  Result Value Ref Range Status   SARS Coronavirus 2 NEGATIVE NEGATIVE Final    Comment: (NOTE) If result is NEGATIVE SARS-CoV-2 target nucleic acids are NOT DETECTED. The SARS-CoV-2 RNA is generally detectable in upper and lower  respiratory specimens during the acute phase of infection. The lowest  concentration of SARS-CoV-2 viral copies this assay can detect is 250  copies / mL. A negative result does not preclude SARS-CoV-2 infection  and should not be used as the sole basis for treatment or other  patient management decisions.  A negative result may occur with  improper specimen collection / handling, submission of specimen other  than nasopharyngeal swab, presence of viral mutation(s) within the  areas targeted by this assay, and inadequate number of viral copies  (<250 copies / mL). A negative result must be combined with clinical  observations, patient history, and epidemiological information. If result is POSITIVE SARS-CoV-2 target nucleic acids are DETECTED. The SARS-CoV-2 RNA is generally detectable in upper and lower  respiratory specimens dur ing the acute phase of infection.  Positive  results are indicative of active infection with SARS-CoV-2.  Clinical  correlation with patient history and other diagnostic information is  necessary to determine patient infection status.  Positive results do  not rule out bacterial infection or co-infection with other viruses. If result is PRESUMPTIVE POSTIVE SARS-CoV-2 nucleic acids MAY BE PRESENT.   A presumptive positive result was obtained on the submitted specimen  and confirmed on repeat testing.  While 2019 novel coronavirus  (SARS-CoV-2) nucleic acids may be present in the submitted sample  additional confirmatory testing may be necessary for epidemiological  and / or clinical management purposes  to differentiate between    SARS-CoV-2 and other Sarbecovirus currently known to infect humans.  If clinically indicated additional testing with an alternate test  methodology [phone]) is advised. The SARS-CoV-2 RNA is generally  detectable in upper and lower respiratory sp ecimens during the acute  phase of infection. The expected result is Negative. Fact Sheet for Patients:  StrictlyIdeas.no Fact Sheet for Healthcare Providers: BankingDealers.co.za This test is not yet approved or cleared by the [place] FDA and has been authorized for detection and/or diagnosis of SARS-CoV-2 by FDA  under an Emergency Use Authorization (EUA).  This EUA will remain in effect (meaning this test can be used) for the duration of the COVID-19 declaration under Section 564(b)(1) of the Act, 21 U.S.C. section 360bbb-3(b)(1), unless the authorization is terminated or revoked sooner. Performed at [facility], [place] [address], [place], [place] [address]          Radiology Studies: Dg Abd Portable 2v  Result Date: 07/13/2018 CLINICAL DATA:  Centralized abdominal pain for the past 5 days. EXAM: PORTABLE ABDOMEN - 2 VIEW COMPARISON:  01/19/2010 FINDINGS: Large stool burden within the cecum and ascending colon. This finding is associated with marked distension of the colon with the cecum measuring approximately they are. Mild gaseous distention of the upstream small bowel with index loop of small bowel within the mid hemiabdomen measuring approximately 3 cm. No pneumoperitoneum, pneumatosis or portal venous gas. Limited visualization of the lower thorax demonstrates sequela prior median sternotomy. Mild scoliotic curvature of the thoracolumbar spine with associated moderate multilevel lumbar spine DDD, incompletely evaluated. Post right femoral neck dynamic screw fixation, incompletely evaluated. IMPRESSION: Large colonic stool burden with findings most suggestive of adynamic ileus  though conceivably a distal colonic obstruction could result in a similar appearance. Clinical correlation is advised. Electronically Signed   By: Sandi Mariscal M.D.   On: 07/13/2018 09:30   Ct Angio Abd/pel W/ And/or W/o  Result Date: 07/13/2018 : Alerts CLINICAL DATA:  Chest pain, shortness of breath, abdominal pain x2 days, weight loss EXAM: CTA ABDOMEN AND PELVIS WITH CONTRAST TECHNIQUE: Multidetector CT imaging of the abdomen and pelvis was performed using the standard protocol during bolus administration of intravenous contrast. Multiplanar reconstructed images and MIPs were obtained and reviewed to evaluate the vascular anatomy. CONTRAST:  113mL OMNIPAQUE IOHEXOL 350 MG/ML SOLN COMPARISON:  07/12/2018, 08/07/2009 FINDINGS: VASCULAR Coronary calcifications. Aorta: Tortuous. Moderate scattered calcified atheromatous plaque. Minimal eccentric nonocclusive mural thrombus in the suprarenal segment. No aneurysm, dissection, or stenosis. Celiac: Short-segment origin stenosis of probable hemodynamic significance, ectatic distally. SMA: Partially calcified ostial plaque/thrombus extending over length of approximately 1.7 cm, resulting in short segment relatively high-grade stenosis. Distally there is a tandem lesion of eccentric partially calcified plaque without high-grade stenosis. Classic distal branch anatomy. Renals: Patent right renal ostial stent with a degree of in stent restenosis of indeterminate hemodynamic severity, patent distally. Duplicated left renal arteries, superior dominant and widely patent. There is calcified plaque at the origin of the more diminutive inferior left renal artery resulting in stenosis of indeterminate hemodynamic significance. IMA: Short-segment origin occlusion or high-grade stenosis, patent distally reconstituted by visceral collaterals. Inflow: Scattered calcified plaque throughout the iliac arterial systems. Linear intraluminal filling defect in the distal right external  iliac artery possibly short dissection flap of indeterminate hemodynamic significance. Origin stenosis of the right internal iliac artery with fusiform dilatation poststenotic dilatation up to 1.3 cm diameter. Proximal Outflow: Atheromatous common femoral arteries. Origin occlusion of bilateral SFA. Deep femoral branches patent. Veins: Patent hepatic veins, portal vein, SMV, splenic vein, bilateral renal veins, IVC and iliac venous system. No venous pathology identified. Review of the MIP images confirms the above findings. NON-VASCULAR Lower chest: Small pleural effusions right greater than left. Atelectasis/consolidation in the lung bases right greater than left. Previous median sternotomy. Hepatobiliary: Periportal edema. Gallbladder physiologically distended. No discrete liver lesion. No biliary ductal dilatation. Pancreas: Unremarkable. No pancreatic ductal dilatation or surrounding inflammatory changes. Spleen: Normal in size without focal abnormality. Adrenals/Urinary Tract: Normal adrenals. Unremarkable kidneys. No  hydronephrosis. Urinary bladder incompletely distended, moderately thick-walled. Stomach/Bowel: Stomach is nondistended. Small bowel is nondilated. Moderate colonic fecal material without dilatation. There is circumferential wall thickening suspected in the descending and sigmoid colon with incomplete luminal distention. No definite adjacent inflammatory/edematous change. Lymphatic: No abdominal or pelvic adenopathy. Reproductive: Prostate is unremarkable. Other: Trace pelvic and perihepatic ascites. No free air. Musculoskeletal: Spondylitic changes throughout the lumbar spine. Sliding screw and IM rod fixation across a comminuted right femoral intertrochanteric fracture. No acute fracture or worrisome bone lesion. IMPRESSION: VASCULAR 1. Significant origin stenoses of celiac axis, SMA, and IMA; resultant increased risk of occlusive mesenteric ischemia. 2. Short-segment dissection flap in the  distal right external iliac artery, of indeterminate hemodynamic significance. Correlate with clinical symptomatology and ABIs. 3. Patent right renal artery ostial stent with a degree of in-stent stenosis of indeterminate hemodynamic significance. 4. Stenosis of the inferior left renal artery of indeterminate hemodynamic significance. 5. Proximal stenosis of the right internal iliac artery, with 1.3 cm poststenotic dilatation. NON-VASCULAR:. 1. Circumferential wall thickening in descending and sigmoid colon consistent with colitis, possibly ischemic given the vascular findings. 2. Small bilateral pleural effusions with bibasilar atelectasis/consolidation. 3. Small amount of abdominal ascites. 4. Thick-walled urinary bladder suggesting a degree of bladder outlet obstruction. 5. Postop and degenerative changes as above. Electronically Signed   By: Lucrezia Europe M.D.   On: 07/13/2018 17:09        Scheduled Meds:  aspirin EC  81 mg Oral Daily   diltiazem  60 mg Oral Q6H   dutasteride  0.5 mg Oral Daily   ezetimibe  10 mg Oral Daily   metoprolol tartrate  75 mg Oral BID   pantoprazole  40 mg Oral Daily   polyethylene glycol  17 g Oral BID   pravastatin  40 mg Oral q1800   ranolazine  500 mg Oral BID   senna-docusate  1 tablet Oral BID   Continuous Infusions:  sodium chloride 50 mL/hr at 07/14/18 1809   ceFEPime (MAXIPIME) IV Stopped (07/14/18 2129)   heparin 1,200 Units/hr (07/15/18 0322)     LOS: 3 days        Aline August, MD [facility] 07/15/2018, 7:28 AM

## 2018-07-15 NOTE — Progress Notes (Signed)
    Subjective  -   Abdominal pain is better today.   Physical Exam:  Abdomen remains soft with decreased tenderness. Palpable femoral pulses bilaterally       Assessment/Plan:    SMA stenosis: I discussed with the patient that because of his possible ischemic colitis, and mesenteric stenosis seen on CT scan, we need to proceed with angiography to better visualize his superior mesenteric artery.  If this is a significant stenosis, this will be treated if possible.  Because of his right hip surgery recently, I told him that we would perform this through a left femoral approach.  He does have restless leg syndrome and has trouble sitting still and so a closure device will most likely be needed.  This will be performed tomorrow.  He will be n.p.o. after midnight.  Wells Brabham 07/15/2018 4:02 PM --  Vitals:   07/15/18 0955 07/15/18 1117  BP: 113/74 (!) 106/56  Pulse: 87 80  Resp:  (!) 24  Temp:  97.9 F (36.6 C)  SpO2:  99%    Intake/Output Summary (Last 24 hours) at 07/15/2018 1602 Last data filed at 07/15/2018 1322 Gross per 24 hour  Intake 240 ml  Output 901 ml  Net -661 ml     Laboratory CBC    Component Value Date/Time   WBC 10.8 (H) 07/15/2018 0519   HGB 7.1 (L) 07/15/2018 0519   HGB 11.4 (L) 05/30/2017 1411   HCT 20.7 (L) 07/15/2018 0519   HCT 33.5 (L) 05/30/2017 1411   PLT 206 07/15/2018 0519   PLT 163 05/30/2017 1411    BMET    Component Value Date/Time   NA 131 (L) 07/15/2018 0519   NA 143 05/30/2017 1411   K 3.8 07/15/2018 0519   CL 100 07/15/2018 0519   CO2 20 (L) 07/15/2018 0519   GLUCOSE 124 (H) 07/15/2018 0519   BUN 32 (H) 07/15/2018 0519   BUN 22 05/30/2017 1411   CREATININE 1.31 (H) 07/15/2018 0519   CREATININE 1.12 10/14/2015 1117   CALCIUM 8.1 (L) 07/15/2018 0519   GFRNONAA 53 (L) 07/15/2018 0519   GFRAA >60 07/15/2018 0519    COAG Lab Results  Component Value Date   INR 1.08 01/24/2011   No results found for: PTT   Antibiotics Anti-infectives (From admission, onward)   Start     Dose/Rate Route Frequency Ordered Stop   07/13/18 1000  levofloxacin (LEVAQUIN) IVPB 750 mg  Status:  Discontinued    Note to Pharmacy:  Received Rocephin/Azithromycin at [facility] 5/7   750 mg 100 mL/hr over 90 Minutes Intravenous Every 24 hours 07/13/18 0001 07/13/18 0043   07/13/18 0045  vancomycin (VANCOCIN) IVPB 1000 mg/200 mL premix  Status:  Discontinued     1,000 mg 200 mL/hr over 60 Minutes Intravenous Every 24 hours 07/13/18 0042 07/14/18 1002   07/13/18 0045  ceFEPIme (MAXIPIME) 2 g in sodium chloride 0.9 % 100 mL IVPB     2 g 200 mL/hr over 30 Minutes Intravenous Every 12 hours 07/13/18 0042         V. Leia Alf, M.D., [facility] [place] Office: [phone] Pager:  [phone]

## 2018-07-15 NOTE — Progress Notes (Signed)
[place] for Heparin Indication: chest pain/ACS  Allergies  Allergen Reactions  . Codeine Nausea And Vomiting  . Sulfa Antibiotics Rash  . Sulfonamide Derivatives Rash    Patient Measurements: Height: 5\' 5"  (165.1 cm) Weight: 138 lb 0.1 oz (62.6 kg) IBW/kg (Calculated) : 61.5 Heparin Dosing Weight: 58kg  Vital Signs: Temp: 98 F (36.7 C) (05/10 1414) Temp Source: Oral (05/10 1414) BP: 144/66 (05/10 1750) Pulse Rate: 79 (05/10 1414)  Labs: Recent Labs    07/13/18 0354 07/13/18 0733 07/13/18 1338 07/13/18 2005  07/14/18 0337 07/14/18 1637 07/15/18 0519 07/15/18 0909 07/15/18 1808  HGB 8.7*  --   --   --   --  7.5*  --  7.1*  --   --   HCT 25.3*  --   --   --   --  21.3*  --  20.7*  --   --   PLT 236  --   --   --   --  208  --  206  --   --   APTT 50*  --  64*  --    < > 47* 51* >200* 116* 70*  HEPARINUNFRC >2.20*  --   --   --   --  >2.20* >2.20* >2.20* >2.20*  --   CREATININE 1.20  --   --   --   --  1.24  --  1.31*  --   --   TROPONINI  --  5.16* 4.90* 4.00*  --   --   --   --   --   --    < > = values in this interval not displayed.    Estimated Creatinine Clearance: 43 mL/min (A) (by C-G formula based on SCr of 1.31 mg/dL (H)).    Assessment: 50 yoM with hx CAD admitted with fever and found to have elevated troponins. Pt started on heparin 750 units/hr prior to transfer to [facility] from OSH ED ~2030 per RN report. Of note, pt also has hx of AFib and had been on Eliquis with last dose on 5/6 at 0900.  Ptt now therapeutic  Goal of Therapy:  Heparin level 0.3-0.7 units/ml aPTT 66-102 seconds Monitor platelets by anticoagulation protocol: Yes   Plan:  -Continue heparin at 1100 units / hr -Daily CBC/HL/aPTT until heparin level and aPTT correlate  -Monitor for bleeding  Thank you Anette Guarneri, PharmD Please use [facility] for clinical pharmacists numbers  07/15/2018      7:02 PM

## 2018-07-15 NOTE — Progress Notes (Signed)
Progress Note  Patient Name: [name] Date of Encounter: 07/15/2018  Primary Cardiologist: Shelva Majestic, MD  Subjective   States that he had a loose stool, abdominal pain is better generally.  No chest pain or shortness of breath.  No palpitations.  Inpatient Medications    Scheduled Meds:  aspirin EC  81 mg Oral Daily   diltiazem  60 mg Oral Q6H   dutasteride  0.5 mg Oral Daily   ezetimibe  10 mg Oral Daily   metoprolol tartrate  75 mg Oral BID   pantoprazole  40 mg Oral Daily   polyethylene glycol  17 g Oral BID   pravastatin  40 mg Oral q1800   ranolazine  500 mg Oral BID   senna-docusate  1 tablet Oral BID   Continuous Infusions:  ceFEPime (MAXIPIME) IV Stopped (07/14/18 2129)   heparin 1,200 Units/hr (07/15/18 0322)   PRN Meds: acetaminophen, bisacodyl, loratadine, methocarbamol, nitroGLYCERIN, ondansetron **OR** ondansetron (ZOFRAN) IV, sodium phosphate, torsemide   Vital Signs    Vitals:   07/15/18 0010 07/15/18 0020 07/15/18 0024 07/15/18 0509  BP: (!) 113/57 121/89 121/89 133/65  Pulse:    83  Resp: 20 20  20   Temp:    98 F (36.7 C)  TempSrc:    Oral  SpO2:    100%  Weight:    62.6 kg  Height:        Intake/Output Summary (Last 24 hours) at 07/15/2018 0753 Last data filed at 07/15/2018 0700 Gross per 24 hour  Intake 480 ml  Output 926 ml  Net -446 ml   Filed Weights   07/12/18 2054 07/14/18 0433 07/15/18 0509  Weight: 58.4 kg 59.7 kg 62.6 kg    Telemetry    Atrial fibrillation.  Personally reviewed.  ECG    Tracing from 07/14/2018 shows atrial fibrillation with right bundle branch block, old inferior infarct pattern, diffuse ST-T wave abnormalities.  Personally reviewed.  Physical Exam   GEN:  Chronically ill-appearing male.  No acute distress.   Neck: No JVD. Cardiac:  Irregularly irregular, 2/6 systolic murmur, no gallop.  Respiratory: Nonlabored. Clear to auscultation bilaterally. GI: Soft, bowel sounds present. MS:  No edema; No deformity. Neuro:  Nonfocal. Psych: Alert and oriented x 3. Normal affect.  Labs    Chemistry Recent Labs  Lab 07/12/18 2336 07/13/18 0354 07/14/18 0337 07/15/18 0519  NA 130* 128* 130* 131*  K 3.5 4.0 3.1* 3.8  CL 100 93* 99 100  CO2 20* 21* 20* 20*  GLUCOSE 123* 158* 102* 124*  BUN 24* 28* 30* 32*  CREATININE 0.99 1.20 1.24 1.31*  CALCIUM 7.9* 8.6* 8.0* 8.1*  PROT 5.4* 5.7* 5.2*  --   ALBUMIN 2.7* 2.8* 2.4*  --   AST 31 44* 46*  --   ALT 14 17 23   --   ALKPHOS 56 60 52  --   BILITOT 0.9 0.8 0.9  --   GFRNONAA >60 59* 57* 53*  GFRAA >60 >60 >60 >60  ANIONGAP 10 14 11 11      Hematology Recent Labs  Lab 07/13/18 0354 07/14/18 0337 07/15/18 0519  WBC 23.2* 13.4* 10.8*  RBC 2.59* 2.25* 2.18*  HGB 8.7* 7.5* 7.1*  HCT 25.3* 21.3* 20.7*  MCV 97.7 94.7 95.0  MCH 33.6 33.3 32.6  MCHC 34.4 35.2 34.3  RDW 15.4 15.0 15.1  PLT 236 208 206    Cardiac Enzymes Recent Labs  Lab 07/13/18 0733 07/13/18 1338 07/13/18 2005  TROPONINI  5.16* 4.90* 4.00*   No results for input(s): TROPIPOC in the last 168 hours.    Radiology    Dg Abd Portable 2v  Result Date: 07/13/2018 CLINICAL DATA:  Centralized abdominal pain for the past 5 days. EXAM: PORTABLE ABDOMEN - 2 VIEW COMPARISON:  01/19/2010 FINDINGS: Large stool burden within the cecum and ascending colon. This finding is associated with marked distension of the colon with the cecum measuring approximately they are. Mild gaseous distention of the upstream small bowel with index loop of small bowel within the mid hemiabdomen measuring approximately 3 cm. No pneumoperitoneum, pneumatosis or portal venous gas. Limited visualization of the lower thorax demonstrates sequela prior median sternotomy. Mild scoliotic curvature of the thoracolumbar spine with associated moderate multilevel lumbar spine DDD, incompletely evaluated. Post right femoral neck dynamic screw fixation, incompletely evaluated. IMPRESSION: Large colonic  stool burden with findings most suggestive of adynamic ileus though conceivably a distal colonic obstruction could result in a similar appearance. Clinical correlation is advised. Electronically Signed   By: Sandi Mariscal M.D.   On: 07/13/2018 09:30   Ct Angio Abd/pel W/ And/or W/o  Result Date: 07/13/2018 : Alerts CLINICAL DATA:  Chest pain, shortness of breath, abdominal pain x2 days, weight loss EXAM: CTA ABDOMEN AND PELVIS WITH CONTRAST TECHNIQUE: Multidetector CT imaging of the abdomen and pelvis was performed using the standard protocol during bolus administration of intravenous contrast. Multiplanar reconstructed images and MIPs were obtained and reviewed to evaluate the vascular anatomy. CONTRAST:  165mL OMNIPAQUE IOHEXOL 350 MG/ML SOLN COMPARISON:  07/12/2018, 08/07/2009 FINDINGS: VASCULAR Coronary calcifications. Aorta: Tortuous. Moderate scattered calcified atheromatous plaque. Minimal eccentric nonocclusive mural thrombus in the suprarenal segment. No aneurysm, dissection, or stenosis. Celiac: Short-segment origin stenosis of probable hemodynamic significance, ectatic distally. SMA: Partially calcified ostial plaque/thrombus extending over length of approximately 1.7 cm, resulting in short segment relatively high-grade stenosis. Distally there is a tandem lesion of eccentric partially calcified plaque without high-grade stenosis. Classic distal branch anatomy. Renals: Patent right renal ostial stent with a degree of in stent restenosis of indeterminate hemodynamic severity, patent distally. Duplicated left renal arteries, superior dominant and widely patent. There is calcified plaque at the origin of the more diminutive inferior left renal artery resulting in stenosis of indeterminate hemodynamic significance. IMA: Short-segment origin occlusion or high-grade stenosis, patent distally reconstituted by visceral collaterals. Inflow: Scattered calcified plaque throughout the iliac arterial systems. Linear  intraluminal filling defect in the distal right external iliac artery possibly short dissection flap of indeterminate hemodynamic significance. Origin stenosis of the right internal iliac artery with fusiform dilatation poststenotic dilatation up to 1.3 cm diameter. Proximal Outflow: Atheromatous common femoral arteries. Origin occlusion of bilateral SFA. Deep femoral branches patent. Veins: Patent hepatic veins, portal vein, SMV, splenic vein, bilateral renal veins, IVC and iliac venous system. No venous pathology identified. Review of the MIP images confirms the above findings. NON-VASCULAR Lower chest: Small pleural effusions right greater than left. Atelectasis/consolidation in the lung bases right greater than left. Previous median sternotomy. Hepatobiliary: Periportal edema. Gallbladder physiologically distended. No discrete liver lesion. No biliary ductal dilatation. Pancreas: Unremarkable. No pancreatic ductal dilatation or surrounding inflammatory changes. Spleen: Normal in size without focal abnormality. Adrenals/Urinary Tract: Normal adrenals. Unremarkable kidneys. No hydronephrosis. Urinary bladder incompletely distended, moderately thick-walled. Stomach/Bowel: Stomach is nondistended. Small bowel is nondilated. Moderate colonic fecal material without dilatation. There is circumferential wall thickening suspected in the descending and sigmoid colon with incomplete luminal distention. No definite adjacent inflammatory/edematous change. Lymphatic: No abdominal or  pelvic adenopathy. Reproductive: Prostate is unremarkable. Other: Trace pelvic and perihepatic ascites. No free air. Musculoskeletal: Spondylitic changes throughout the lumbar spine. Sliding screw and IM rod fixation across a comminuted right femoral intertrochanteric fracture. No acute fracture or worrisome bone lesion. IMPRESSION: VASCULAR 1. Significant origin stenoses of celiac axis, SMA, and IMA; resultant increased risk of occlusive  mesenteric ischemia. 2. Short-segment dissection flap in the distal right external iliac artery, of indeterminate hemodynamic significance. Correlate with clinical symptomatology and ABIs. 3. Patent right renal artery ostial stent with a degree of in-stent stenosis of indeterminate hemodynamic significance. 4. Stenosis of the inferior left renal artery of indeterminate hemodynamic significance. 5. Proximal stenosis of the right internal iliac artery, with 1.3 cm poststenotic dilatation. NON-VASCULAR:. 1. Circumferential wall thickening in descending and sigmoid colon consistent with colitis, possibly ischemic given the vascular findings. 2. Small bilateral pleural effusions with bibasilar atelectasis/consolidation. 3. Small amount of abdominal ascites. 4. Thick-walled urinary bladder suggesting a degree of bladder outlet obstruction. 5. Postop and degenerative changes as above. Electronically Signed   By: Lucrezia Europe M.D.   On: 07/13/2018 17:09    Cardiac Studies   Echocardiogram 08/24/2016: Study Conclusions  - Left ventricle: The cavity size was normal. Wall thickness was increased in a pattern of moderate LVH. Systolic function was normal. The estimated ejection fraction was in the range of 55% to 60%. Wall motion was normal; there were no regional wall motion abnormalities. The study is not technically sufficient to allow evaluation of LV diastolic function. - Mitral valve: Calcified annulus. Mildly thickened leaflets . There was mild regurgitation. - Left atrium: The atrium was normal in size. - Right atrium: The atrium was mildly dilated. - Tricuspid valve: There was mild regurgitation. - Pulmonary arteries: PA peak pressure: 48 mm Hg (S). - Inferior vena cava: The vessel was dilated. The respirophasic diameter changes were blunted (<50%), consistent with elevated central venous pressure.  Impressions:  - Compared to a prior study in 2016, the LVEF is stable at  55-60%.  Patient Profile     73 y.o. male with a history of CAD status post CABG and redo operation most recently 1996, multiple PCI's, carotid arterectomy and renal artery stenting, atrial fibrillation, recent right hip fracture, now presenting with fever, abdominal pain, elevated troponin I, ultimately with findings of mesenteric ischemia.  Assessment & Plan    1.  NSTEMI, likely type II event in association with demand ischemia.  Peak troponin I 5.16.  No active chest pain and ECG shows overall nonspecific ST-T wave abnormalities.  2.  Persistent atrial fibrillation, off Eliquis at this time.  Heart rate control is reasonable on beta-blocker and calcium channel blocker.  3.  Multivessel CAD status post CABG and redo operation.  He has been managed medically.  4.  Anemia, hemoglobin 7.1, likely combination of blood loss and chronic disease.  5.  Recent hip fracture status post surgical repair.  He was only able to complete 2 rehabilitation sessions.  6.  GERD on PPI.  7.  Mesenteric ischemia with work-up per Dr. Trula Slade and potentially stent intervention this week.  Continue aspirin, Cardizem, Lopressor, Zetia, Ranexa, heparin and Pravachol.  Overall high risk patient from the perspective of surgical risk, but not prohibitive to proceed, in particular with a percutaneous procedure.  Signed, Rozann Lesches, MD  07/15/2018, 7:53 AM

## 2018-07-15 NOTE — Evaluation (Signed)
Clinical/Bedside Swallow Evaluation Patient Details  Name: JAICEON COLLISTER MRN: [id number] Date of Birth: 06-20-1945  Today's Date: 07/15/2018 Time: SLP Start Time (ACUTE ONLY): 32 SLP Stop Time (ACUTE ONLY): 1028 SLP Time Calculation (min) (ACUTE ONLY): 8 min  Past Medical History:  Past Medical History:  Diagnosis Date  . Anemia   . Angina   . Atrial fibrillation ([place])   . Blood transfusion   . Bronchitis   . Bulging discs    "8 of them; thoracic, lumbar, sacral area"  . Chronic back pain   . Colon polyps   . Complication of anesthesia   . Coronary artery disease 01/24/11   Successful PCI long segmental stensois  vein graft to the CX marginal vessel-graft previously stented prox. 3.0x44mm TAXUS stent mid seg 3.0x43mm TAXUS stent now tandem stens of 3.0x82mm & 3.0x66mm placed with the seg. 50,80 & 99% stenosis reduced to 0%  . DDD (degenerative disc disease)   . Dysrhythmia    "PAC's and PVC's"  . Esophageal dilatation    "have had it done 7 times; last time ~ 2001"  . Esophageal stricture   . GERD (gastroesophageal reflux disease)   . Headache(784.0)   . Hiatal hernia   . Hyperlipidemia   . Myocardial infarction ([place]) 1991  . Myocardial infarction [facility]) 2005   "had 3 heart attacks this year"  . Paroxysmal atrial fibrillation ([facility])   . Pyloric stenosis   . RBBB   . Renal artery stenosis ([facility])    hhistory of right renal artery stenting and re-intervention for "in-stent restenosis.  . S/P CABG (coronary artery bypass graft) [place]  . Shortness of breath    "when I had the heart attacks"  . Subclavian artery stenosis, left ([place])   . Ulcer    "small; Dr. Henrene Pastor found it 02/2010"   Past Surgical History:  Past Surgical History:  Procedure Laterality Date  . ADENOIDECTOMY     "when I was an infant"  . APPENDECTOMY  1953  . CARDIOVERSION N/A 04/17/2017   Procedure: CARDIOVERSION;  Surgeon: Jerline Pain, MD;  Location[facility]  ENDOSCOPY;  Service: Cardiovascular;  Laterality: N/A;  . CAROTID ENDARTERECTOMY Right [place]  01/24/11   "today makes a total of 9 stents"  . CORONARY ARTERY BYPASS GRAFT  1984   CABG X 4  . CORONARY ARTERY BYPASS GRAFT  1996   CABG X 4  . LEFT HEART CATHETERIZATION WITH CORONARY/GRAFT ANGIOGRAM N/A 01/24/2011   Procedure: LEFT HEART CATHETERIZATION WITH Beatrix Fetters;  Surgeon: Troy Sine, MD;  Location: [facility] CATH LAB;  Service: Cardiovascular;  Laterality: N/A;  . pylondial cyst removal    . RETINAL DETACHMENT SURGERY  04/2007   right  . TONSILLECTOMY  1972   HPI:  73 y.o. male history of CAD status post CABG and redo operation most recently 1996, multiple PCI's, carotid arterectomy and renal artery stenting, atrial fibrillation, recent right hip fracture, now presenting with fever, abdominal pain, elevated troponin I, ultimately with findings of mesenteric ischemia. Pt had a recent hip fx, has had fevers concerning for HCAP. Covid negative.Pt reports a history fo esophageal stricture with dilation and GERD.    Assessment / Plan / Recommendation Clinical Impression  Pt demonstrates ability to consume 3 oz consecutively without signs of aspiration. He has struggled with moderately esophageal dysphagia for over 10 years but verbalizes excellent awareness and  use of precautions. He is alert and independent. He areports he did have some severe GER symptoms when immediately recovering from his hip fx, but this has resolved now that he is back on his meds (nexium BID) and able to sit upright. Offered advice about pills as pt has not tried whole with puree and he toelrated this well. No SLP needs here, pt to continue current diet.  SLP Visit Diagnosis: Dysphagia, unspecified (R13.10)    Aspiration Risk  Mild aspiration risk    Diet Recommendation Regular;Thin liquid   Liquid Administration via: Cup;Straw Medication Administration:  Whole meds with puree Supervision: Patient able to self feed Postural Changes: Seated upright at 90 degrees    Other  Recommendations     Follow up Recommendations None      Frequency and Duration            Prognosis        Swallow Study   General HPI: 73 y.o. male history of CAD status post CABG and redo operation most recently 1996, multiple PCI's, carotid arterectomy and renal artery stenting, atrial fibrillation, recent right hip fracture, now presenting with fever, abdominal pain, elevated troponin I, ultimately with findings of mesenteric ischemia. Pt had a recent hip fx, has had fevers concerning for HCAP. Covid negative.Pt reports a history fo esophageal stricture with dilation and GERD.  Type of Study: Bedside Swallow Evaluation Previous Swallow Assessment: see HPI Diet Prior to this Study: Regular;Thin liquids Temperature Spikes Noted: No Respiratory Status: Room air History of Recent Intubation: No Behavior/Cognition: Alert;Cooperative;Pleasant mood Oral Cavity Assessment: Within Functional Limits Oral Care Completed by SLP: No Oral Cavity - Dentition: Missing dentition Vision: Functional for self-feeding Self-Feeding Abilities: Able to feed self Patient Positioning: Upright in chair Baseline Vocal Quality: Normal Volitional Cough: Strong Volitional Swallow: Able to elicit    Oral/Motor/Sensory Function Overall Oral Motor/Sensory Function: Within functional limits   Ice Chips     Thin Liquid Thin Liquid: Within functional limits    Nectar Thick Nectar Thick Liquid: Not tested   Honey Thick Honey Thick Liquid: Not tested   Puree Puree: Within functional limits   Solid     Solid: Not tested     Herbie [place], MA [place] Pager [phone] Office [phone]  Lynann Beaver 07/15/2018,11:15 AM

## 2018-07-15 NOTE — Progress Notes (Signed)
[place] for Heparin Indication: chest pain/ACS  Allergies  Allergen Reactions  . Codeine Nausea And Vomiting  . Sulfa Antibiotics Rash  . Sulfonamide Derivatives Rash    Patient Measurements: Height: 5\' 5"  (165.1 cm) Weight: 138 lb 0.1 oz (62.6 kg) IBW/kg (Calculated) : 61.5 Heparin Dosing Weight: 58kg  Vital Signs: Temp: 97.5 F (36.4 C) (05/10 0744) Temp Source: Oral (05/10 0744) BP: 113/74 (05/10 0955) Pulse Rate: 87 (05/10 0955)  Labs: Recent Labs    07/13/18 0354 07/13/18 3744 07/13/18 1338 07/13/18 2005  07/14/18 0337 07/14/18 1637 07/15/18 0519 07/15/18 0909  HGB 8.7*  --   --   --   --  7.5*  --  7.1*  --   HCT 25.3*  --   --   --   --  21.3*  --  20.7*  --   PLT 236  --   --   --   --  208  --  206  --   APTT 50*  --  64*  --    < > 47* 51* >200* 116*  HEPARINUNFRC >2.20*  --   --   --   --  >2.20* >2.20* >2.20* >2.20*  CREATININE 1.20  --   --   --   --  1.24  --  1.31*  --   TROPONINI  --  5.16* 4.90* 4.00*  --   --   --   --   --    < > = values in this interval not displayed.    Estimated Creatinine Clearance: 43 mL/min (A) (by C-G formula based on SCr of 1.31 mg/dL (H)).    Assessment: 46 yoM with hx CAD admitted with fever and found to have elevated troponins. Pt started on heparin 750 units/hr prior to transfer to [facility] from OSH ED ~2030 per RN report. Of note, pt also has hx of AFib and had been on Eliquis with last dose on 5/6 at 0900.  APTT supratherapeutic at 116 seconds. No bleeding or infusion issues noted and confirmed with nursing and patient. Hemoglobin trending down to 7.1, plt 206.   Goal of Therapy:  Heparin level 0.3-0.7 units/ml aPTT 66-102 seconds Monitor platelets by anticoagulation protocol: Yes   Plan:  -Decrease heparin to 1100 units/hr -Confirmatory aPTT at 1900 -Daily CBC/HL/aPTT until heparin level and aPTT correlate  -Monitor for bleeding  Thank you Anette Guarneri,  PharmD Please use [facility] for clinical pharmacists numbers  07/15/2018      10:21 AM

## 2018-07-16 ENCOUNTER — Encounter (HOSPITAL_COMMUNITY)
Admission: AD | Disposition: A | Discharge status: Home Health | Payer: Self-pay | Source: Other Acute Inpatient Hospital | Attending: Internal Medicine

## 2018-07-16 DIAGNOSIS — I480 Paroxysmal atrial fibrillation: Secondary | ICD-10-CM

## 2018-07-16 DIAGNOSIS — I739 Peripheral vascular disease, unspecified: Secondary | ICD-10-CM

## 2018-07-16 HISTORY — PX: VISCERAL ANGIOGRAPHY: CATH118276

## 2018-07-16 HISTORY — PX: PERIPHERAL VASCULAR INTERVENTION: CATH118257

## 2018-07-16 LAB — CBC
HCT: 20.9 % — ABNORMAL LOW (ref 39.0–52.0)
Hemoglobin: 7.1 g/dL — ABNORMAL LOW (ref 13.0–17.0)
MCH: 32.1 pg (ref 26.0–34.0)
MCHC: 34 g/dL (ref 30.0–36.0)
MCV: 94.6 fL (ref 80.0–100.0)
Platelets: 224 10*3/uL (ref 150–400)
RBC: 2.21 MIL/uL — ABNORMAL LOW (ref 4.22–5.81)
RDW: 14.9 % (ref 11.5–15.5)
WBC: 7.7 10*3/uL (ref 4.0–10.5)
nRBC: 0 % (ref 0.0–0.2)

## 2018-07-16 LAB — BASIC METABOLIC PANEL
Anion gap: 13 (ref 5–15)
BUN: 36 mg/dL — ABNORMAL HIGH (ref 8–23)
CO2: 21 mmol/L — ABNORMAL LOW (ref 22–32)
Calcium: 8.5 mg/dL — ABNORMAL LOW (ref 8.9–10.3)
Chloride: 99 mmol/L (ref 98–111)
Creatinine, Ser: 1.37 mg/dL — ABNORMAL HIGH (ref 0.61–1.24)
GFR calc Af Amer: 58 mL/min — ABNORMAL LOW (ref >60)
GFR calc non Af Amer: 50 mL/min — ABNORMAL LOW (ref >60)
Glucose, Bld: 124 mg/dL — ABNORMAL HIGH (ref 70–99)
Potassium: 4 mmol/L (ref 3.5–5.1)
Sodium: 133 mmol/L — ABNORMAL LOW (ref 135–145)

## 2018-07-16 LAB — POCT ACTIVATED CLOTTING TIME
Activated Clotting Time: 241 seconds
Activated Clotting Time: 307 seconds
Activated Clotting Time: 235 seconds
Activated Clotting Time: 202 seconds
Activated Clotting Time: 175 seconds

## 2018-07-16 LAB — APTT: aPTT: 57 seconds — ABNORMAL HIGH (ref 24–36)

## 2018-07-16 LAB — HEPARIN LEVEL (UNFRACTIONATED): Heparin Unfractionated: 1.24 IU/mL — ABNORMAL HIGH (ref 0.30–0.70)

## 2018-07-16 SURGERY — VISCERAL ANGIOGRAPHY
Anesthesia: LOCAL

## 2018-07-16 MED ORDER — SODIUM CHLORIDE 0.9 % IV SOLN: 250.0000 mL | INTRAVENOUS | Status: DC | PRN

## 2018-07-16 MED ORDER — HEPARIN (PORCINE) IN NACL 1000-0.9 UT/500ML-% IV SOLN
INTRAVENOUS | Status: AC
  Filled 2018-07-16: qty 1000

## 2018-07-16 MED ORDER — CLOPIDOGREL BISULFATE 300 MG PO TABS
ORAL_TABLET | ORAL | Status: AC
  Filled 2018-07-16: qty 1

## 2018-07-16 MED ORDER — ALUM & MAG HYDROXIDE-SIMETH 200-200-20 MG/5ML PO SUSP
30.0000 mL | Freq: Four times a day (QID) | ORAL | Status: DC | PRN
  Administered 2018-07-16 – 2018-07-19 (×6): 30 mL via ORAL
  Filled 2018-07-16 (×5): qty 30

## 2018-07-16 MED ORDER — CLOPIDOGREL BISULFATE 75 MG PO TABS: 75.0000 mg | ORAL_TABLET | Freq: Every day | ORAL | Status: DC

## 2018-07-16 MED ORDER — HYDROCODONE-ACETAMINOPHEN 5-325 MG PO TABS
1.0000 | ORAL_TABLET | Freq: Four times a day (QID) | ORAL | Status: DC | PRN
  Administered 2018-07-16 – 2018-07-21 (×3): 2 via ORAL
  Filled 2018-07-16 (×4): qty 2

## 2018-07-16 MED ORDER — HYDRALAZINE HCL 20 MG/ML IJ SOLN: 5.0000 mg | INTRAMUSCULAR | Status: DC | PRN

## 2018-07-16 MED ORDER — LABETALOL HCL 5 MG/ML IV SOLN: 10.0000 mg | INTRAVENOUS | Status: DC | PRN

## 2018-07-16 MED ORDER — FENTANYL CITRATE (PF) 100 MCG/2ML IJ SOLN
INTRAMUSCULAR | Status: DC | PRN
  Administered 2018-07-16 (×2): 25 ug via INTRAVENOUS

## 2018-07-16 MED ORDER — SODIUM CHLORIDE 0.9 % IV SOLN: INTRAVENOUS | Status: AC

## 2018-07-16 MED ORDER — IODIXANOL 320 MG/ML IV SOLN
INTRAVENOUS | Status: DC | PRN
  Administered 2018-07-16: 10:00:00 230 mL via INTRA_ARTERIAL

## 2018-07-16 MED ORDER — SODIUM CHLORIDE 0.9% FLUSH: 3.0000 mL | INTRAVENOUS | Status: DC | PRN

## 2018-07-16 MED ORDER — HEPARIN (PORCINE) IN NACL 1000-0.9 UT/500ML-% IV SOLN
INTRAVENOUS | Status: DC | PRN
  Administered 2018-07-16: 500 mL

## 2018-07-16 MED ORDER — OXYCODONE HCL 5 MG PO TABS: 5.0000 mg | ORAL_TABLET | ORAL | Status: DC | PRN

## 2018-07-16 MED ORDER — ALUM & MAG HYDROXIDE-SIMETH 200-200-20 MG/5ML PO SUSP
ORAL | Status: AC
  Filled 2018-07-16: qty 30

## 2018-07-16 MED ORDER — SODIUM CHLORIDE 0.9% FLUSH
3.0000 mL | Freq: Two times a day (BID) | INTRAVENOUS | Status: DC
  Administered 2018-07-16 – 2018-07-21 (×8): 3 mL via INTRAVENOUS

## 2018-07-16 MED ORDER — LIDOCAINE HCL (PF) 1 % IJ SOLN
INTRAMUSCULAR | Status: AC
  Filled 2018-07-16: qty 30

## 2018-07-16 MED ORDER — FENTANYL CITRATE (PF) 100 MCG/2ML IJ SOLN
INTRAMUSCULAR | Status: AC
  Filled 2018-07-16: qty 2

## 2018-07-16 MED ORDER — LIDOCAINE HCL (PF) 1 % IJ SOLN
INTRAMUSCULAR | Status: DC | PRN
  Administered 2018-07-16: 15 mL

## 2018-07-16 MED ORDER — MORPHINE SULFATE (PF) 2 MG/ML IV SOLN: 2.0000 mg | INTRAVENOUS | Status: DC | PRN

## 2018-07-16 MED ORDER — TRAZODONE HCL 50 MG PO TABS
50.0000 mg | ORAL_TABLET | Freq: Once | ORAL | Status: AC
  Administered 2018-07-18: 50 mg via ORAL
  Filled 2018-07-16: qty 1

## 2018-07-16 MED ORDER — MORPHINE SULFATE (PF) 10 MG/ML IV SOLN: 2.0000 mg | INTRAVENOUS | Status: DC | PRN

## 2018-07-16 MED ORDER — CLOPIDOGREL BISULFATE 75 MG PO TABS
300.0000 mg | ORAL_TABLET | Freq: Once | ORAL | Status: AC
  Administered 2018-07-16: 11:00:00 300 mg via ORAL

## 2018-07-16 MED ORDER — DILTIAZEM HCL 60 MG PO TABS
30.0000 mg | ORAL_TABLET | Freq: Four times a day (QID) | ORAL | Status: DC
  Administered 2018-07-16 – 2018-07-21 (×20): 30 mg via ORAL
  Filled 2018-07-16 (×20): qty 1

## 2018-07-16 MED ORDER — METOPROLOL TARTRATE 100 MG PO TABS
100.0000 mg | ORAL_TABLET | Freq: Two times a day (BID) | ORAL | Status: DC
  Administered 2018-07-16 – 2018-07-21 (×10): 100 mg via ORAL
  Filled 2018-07-16 (×10): qty 1

## 2018-07-16 MED ORDER — HEPARIN SODIUM (PORCINE) 1000 UNIT/ML IJ SOLN
INTRAMUSCULAR | Status: DC | PRN
  Administered 2018-07-16: 7000 [IU] via INTRAVENOUS
  Administered 2018-07-16: 3000 [IU] via INTRAVENOUS

## 2018-07-16 MED ORDER — SODIUM CHLORIDE 0.9 % IV SOLN
INTRAVENOUS | Status: DC
  Administered 2018-07-16 (×3): via INTRAVENOUS

## 2018-07-16 SURGICAL SUPPLY — 18 items
CATH ANGIO 5F PIGTAIL 65CM (CATHETERS) ×1 IMPLANT
CATH SOFT-VU ST 4F 90CM (CATHETERS) ×1 IMPLANT
KIT ENCORE 26 ADVANTAGE (KITS) ×1 IMPLANT
KIT PV (KITS) ×3 IMPLANT
SHEATH PINNACLE 5F 10CM (SHEATH) ×1 IMPLANT
SHEATH PINNACLE 6F 10CM (SHEATH) ×1 IMPLANT
SHEATH PINNACLE 7F 10CM (SHEATH) ×1 IMPLANT
SHEATH PROBE COVER 6X72 (BAG) ×1 IMPLANT
STENT EXPRESS LD 6X27X75 (Permanent Stent) ×1 IMPLANT
STENT HERCULINK RX 6.0X15X135 (Permanent Stent) ×1 IMPLANT
STOPCOCK MORSE 400PSI 3WAY (MISCELLANEOUS) ×1 IMPLANT
SYR MEDRAD MARK 7 150ML (SYRINGE) ×3 IMPLANT
TRANSDUCER W/STOPCOCK (MISCELLANEOUS) ×3 IMPLANT
TRAY PV CATH (CUSTOM PROCEDURE TRAY) ×3 IMPLANT
TUBING CIL FLEX 10 FLL-RA (TUBING) ×1 IMPLANT
WIRE HITORQ VERSACORE ST 145CM (WIRE) ×1 IMPLANT
WIRE ROSEN-J .035X260CM (WIRE) ×1 IMPLANT
WIRE STABILIZER XS .014X180CM (WIRE) ×1 IMPLANT

## 2018-07-16 NOTE — Progress Notes (Signed)
Patient ID: [name], male   DOB: 06-Feb-1946, 73 y.o.   MRN: [id number]  PROGRESS NOTE    [name]  JKQ:[id number] DOB: 09-08-1945 DOA: 07/12/2018 PCP: Garwin Brothers, MD   Brief Narrative:  73 year old male with history of coronary artery disease status post CABG x2 in 1984 and 1996, subsequent MI with multiple PCI's, peripheral vascular disease status post CEA in 1906 and renal artery stenting in 2005, paroxysmal A. fib on Eliquis, GERD and recent right hip fracture status post repair at [facility] and subsequent discharged on 07/07/2018 was transferred from [facility] for fever, cough and shortness of breath.  COVID-19 testing was negative.  Troponin was 2.70.  CT angiogram of the chest showed no PE but areas of mild mucus impaction in the right lower lobe along with severe stenosis of the SMA origin.  Cardiology was consulted and patient was transferred to [facility] and started on heparin drip.  He was also started on broad-spectrum antibiotics.  He was also found to have severe SMA stenosis.  Vascular surgery was consulted.  Assessment & Plan:   Principal Problem:   NSTEMI (non-ST elevated myocardial infarction) [facility]) Active Problems:   Essential hypertension   Hx of CABG '84, '96, last PCI 2012   GASTROESOPHAGEAL REFLUX DISEASE   PVD (peripheral vascular disease) ([place])   Carotid disease, bilateral ([place])   Hyperlipidemia LDL goal <70   Paroxysmal atrial fibrillation (HCC)   Coronary artery disease   Fever   Lobar pneumonia (HCC)   Mesenteric ischemia (HCC)  Positive troponin/probable non-STEMI in a patient with known history of coronary artery disease/CABG and history of multiple PCI's -Troponin on presentation was 2.7.  Troponin peaked to 5.16 and is currently trending down.  Last troponin was 4.0.  -Patient never had any chest pain.  -Cardiology following.  Continue medical management for now.  Continue heparin drip.   Probable healthcare associated  pneumonia -CT angiogram of the chest showed no PE but areas of mild mucus impaction in the right lower lobe -COVID-19 testing negative -Currently on room air.   -Currently on cefepime. Off vancomycin.  Complete 5 to 7 days of antibiotic therapy -Diet as per SLP recommendation  Leukocytosis -From above.  Resolved  Hyponatremia -From poor oral intake.  Improving.  Off IV fluids  Hypokalemia -Improved.  Constipation -Continue current laxative regimen.   Paroxysmal A. fib with RVR -Currently rate controlled.  Continue Cardizem and beta-blocker if blood pressure allows.  Continue heparin drip.  Hold Eliquis for now.  Cardiology following  Significant origin stenosis of the celiac axis, SMA and IMA with probable ischemic colitis -Vascular surgery following. -CTA of the abdomen and pelvis showed significant findings as above.  For probable abdominal angiogram and possible stenting by vascular surgery today.  Continue antibiotics  Chronic anemia -Questionable cause.  No signs of bleeding.  Hemoglobin slightly drifting downwards.  Transfuse if hemoglobin is less than 7.  Peripheral vascular disease -Continue with statin  GERD -Continue with PPIs  Hyperlipidemia--continue with statin  Chronic back pain -Continue home regimen of pain medications  Right hip fracture status post recent right hip surgery -Right hip incisional wound looks fine.  No evidence of hip infection.  Outpatient follow-up with orthopedics   DVT prophylaxis: Heparin drip Code Status: Full Family Communication: None at bedside Disposition Plan: Depends on clinical outcome  Consultants: Cardiology/vascular surgery  Procedures: None  Antimicrobials: cefepime from 07/12/2018 onwards Vancomycin 07/12/2018-07/14/2018   Subjective: Patient seen and examined at  bedside.  Denies any overnight fever, chest pain or shortness of breath.  Feels slightly weaker and complaining of right hip pain.   Objective: Vitals:     07/15/18 2019 07/15/18 2108 07/16/18 0013 07/16/18 0443  BP: (!) 118/45  96/65 108/62  Pulse: 82 90 79 78  Resp: 19  20 (!) 24  Temp: 98.3 F (36.8 C)  98.1 F (36.7 C) 98.1 F (36.7 C)  TempSrc: Oral  Oral Oral  SpO2: 99%  98% 98%  Weight:    62.6 kg  Height:        Intake/Output Summary (Last 24 hours) at 07/16/2018 0745 Last data filed at 07/15/2018 1629 Gross per 24 hour  Intake 480 ml  Output 1150 ml  Net -670 ml   Filed Weights   07/14/18 0433 07/15/18 0509 07/16/18 0443  Weight: 59.7 kg 62.6 kg 62.6 kg    Examination:  General exam: No acute distress Respiratory system: Bilateral decreased breath sounds at bases with some scattered crackles  cardiovascular system: S1-S2 heard, rate controlled Gastrointestinal system: Abdomen is slightly distended, soft and nontender.  Bowel sounds heard  extremities: No cyanosis; trace edema.    Data Reviewed: I have personally reviewed following labs and imaging studies  CBC: Recent Labs  Lab 07/12/18 2336 07/13/18 0354 07/14/18 0337 07/15/18 0519 07/16/18 0207  WBC 19.4* 23.2* 13.4* 10.8* 7.7  NEUTROABS 17.3*  --  11.6*  --   --   HGB 7.9* 8.7* 7.5* 7.1* 7.1*  HCT 23.1* 25.3* 21.3* 20.7* 20.9*  MCV 96.3 97.7 94.7 95.0 94.6  PLT 222 236 208 206 161   Basic Metabolic Panel: Recent Labs  Lab 07/12/18 2336 07/13/18 0354 07/14/18 0337 07/15/18 0519 07/16/18 0207  NA 130* 128* 130* 131* 133*  K 3.5 4.0 3.1* 3.8 4.0  CL 100 93* 99 100 99  CO2 20* 21* 20* 20* 21*  GLUCOSE 123* 158* 102* 124* 124*  BUN 24* 28* 30* 32* 36*  CREATININE 0.99 1.20 1.24 1.31* 1.37*  CALCIUM 7.9* 8.6* 8.0* 8.1* 8.5*  MG  --   --  1.8 1.7  --    GFR: Estimated Creatinine Clearance: 41.1 mL/min (A) (by C-G formula based on SCr of 1.37 mg/dL (H)). Liver Function Tests: Recent Labs  Lab 07/12/18 2336 07/13/18 0354 07/14/18 0337  AST 31 44* 46*  ALT 14 17 23   ALKPHOS 56 60 52  BILITOT 0.9 0.8 0.9  PROT 5.4* 5.7* 5.2*  ALBUMIN  2.7* 2.8* 2.4*   No results for input(s): LIPASE, AMYLASE in the last 168 hours. No results for input(s): AMMONIA in the last 168 hours. Coagulation Profile: No results for input(s): INR, PROTIME in the last 168 hours. Cardiac Enzymes: Recent Labs  Lab 07/13/18 0733 07/13/18 1338 07/13/18 2005  TROPONINI 5.16* 4.90* 4.00*   BNP (last 3 results) No results for input(s): PROBNP in the last 8760 hours. HbA1C: No results for input(s): HGBA1C in the last 72 hours. CBG: No results for input(s): GLUCAP in the last 168 hours. Lipid Profile: No results for input(s): CHOL, HDL, LDLCALC, TRIG, CHOLHDL, LDLDIRECT in the last 72 hours. Thyroid Function Tests: No results for input(s): TSH, T4TOTAL, FREET4, T3FREE, THYROIDAB in the last 72 hours. Anemia Panel: Recent Labs    07/14/18 0337  FERRITIN 122   Sepsis Labs: Recent Labs  Lab 07/13/18 1009 07/13/18 1338 07/13/18 1711  PROCALCITON 0.78  --   --   LATICACIDVEN 2.3* 1.6 1.8    Recent Results (from the  past 240 hour(s))  Culture, blood (Routine X 2) w Reflex to ID Panel     Status: None (Preliminary result)   Collection Time: 07/12/18 11:36 PM  Result Value Ref Range Status   Specimen Description BLOOD RIGHT ARM  Final   Special Requests   Final    BOTTLES DRAWN AEROBIC ONLY Blood Culture adequate volume   Culture   Final    NO GROWTH 3 DAYS Performed at [facility], [address] [place], [place] [address]    Report Status PENDING  Incomplete  Culture, blood (Routine X 2) w Reflex to ID Panel     Status: None (Preliminary result)   Collection Time: 07/12/18 11:38 PM  Result Value Ref Range Status   Specimen Description BLOOD RIGHT HAND  Final   Special Requests   Final    BOTTLES DRAWN AEROBIC ONLY Blood Culture adequate volume   Culture   Final    NO GROWTH 3 DAYS Performed at [facility], [place] [address], [place], [place] [address]    Report Status PENDING  Incomplete  SARS Coronavirus 2 (CEPHEID-  Performed in [place] hospital lab), Hosp Order     Status: None   Collection Time: 07/13/18  9:42 AM  Result Value Ref Range Status   SARS Coronavirus 2 NEGATIVE NEGATIVE Final    Comment: (NOTE) If result is NEGATIVE SARS-CoV-2 target nucleic acids are NOT DETECTED. The SARS-CoV-2 RNA is generally detectable in upper and lower  respiratory specimens during the acute phase of infection. The lowest  concentration of SARS-CoV-2 viral copies this assay can detect is 250  copies / mL. A negative result does not preclude SARS-CoV-2 infection  and should not be used as the sole basis for treatment or other  patient management decisions.  A negative result may occur with  improper specimen collection / handling, submission of specimen other  than nasopharyngeal swab, presence of viral mutation(s) within the  areas targeted by this assay, and inadequate number of viral copies  (<250 copies / mL). A negative result must be combined with clinical  observations, patient history, and epidemiological information. If result is POSITIVE SARS-CoV-2 target nucleic acids are DETECTED. The SARS-CoV-2 RNA is generally detectable in upper and lower  respiratory specimens dur ing the acute phase of infection.  Positive  results are indicative of active infection with SARS-CoV-2.  Clinical  correlation with patient history and other diagnostic information is  necessary to determine patient infection status.  Positive results do  not rule out bacterial infection or co-infection with other viruses. If result is PRESUMPTIVE POSTIVE SARS-CoV-2 nucleic acids MAY BE PRESENT.   A presumptive positive result was obtained on the submitted specimen  and confirmed on repeat testing.  While 2019 novel coronavirus  (SARS-CoV-2) nucleic acids may be present in the submitted sample  additional confirmatory testing may be necessary for epidemiological  and / or clinical management purposes  to differentiate between    SARS-CoV-2 and other Sarbecovirus currently known to infect humans.  If clinically indicated additional testing with an alternate test  methodology [phone]) is advised. The SARS-CoV-2 RNA is generally  detectable in upper and lower respiratory sp ecimens during the acute  phase of infection. The expected result is Negative. Fact Sheet for Patients:  StrictlyIdeas.no Fact Sheet for Healthcare Providers: BankingDealers.co.za This test is not yet approved or cleared by the [place] FDA and has been authorized for detection and/or diagnosis of SARS-CoV-2 by FDA under  an Emergency Use Authorization (EUA).  This EUA will remain in effect (meaning this test can be used) for the duration of the COVID-19 declaration under Section 564(b)(1) of the Act, 21 U.S.C. section 360bbb-3(b)(1), unless the authorization is terminated or revoked sooner. Performed at [facility], [place] [address], [place], [place] [address]          Radiology Studies: No results found.      Scheduled Meds:  aspirin EC  81 mg Oral Daily   diltiazem  60 mg Oral Q6H   dutasteride  0.5 mg Oral Daily   ezetimibe  10 mg Oral Daily   metoprolol tartrate  75 mg Oral BID   pantoprazole  40 mg Oral Daily   polyethylene glycol  17 g Oral BID   pravastatin  40 mg Oral q1800   ranolazine  500 mg Oral BID   senna-docusate  1 tablet Oral BID   traZODone  50 mg Oral Once   Continuous Infusions:  sodium chloride     ceFEPime (MAXIPIME) IV 2 g (07/15/18 2112)   heparin 1,100 Units/hr (07/15/18 2110)     LOS: 4 days        Aline August, MD [facility] 07/16/2018, 7:45 AM

## 2018-07-16 NOTE — Progress Notes (Signed)
Patient stated feeling weaker than yesterday. Stated having 7/10 right hip pain. Noted dark brown/red soft stools. Paged [facility] for pain medication and early labs. Will continue to monitor patient.

## 2018-07-16 NOTE — Op Note (Signed)
Procedure: Ultrasound left groin, abdominal aortogram, selective celiac and superior mesenteric angiogram, celiac artery stent (6 x 15), superior mesenteric artery stent (6 x 27)  Preoperative diagnosis: Chronic mesenteric ischemia  Postoperative diagnosis: Same  Anesthesia: Local with IV sedation  Operative findings:  1.  Celiac artery stenosis 90% stented to 0% residual stenosis  2.  Superior mesenteric artery stenosis 80% stented to 0% residual stenosis  3.  6.5 French Oscor sheath left groin  Operative details: After pain informed consent, the patient was taken the PV lab.  The patient was placed in supine position on the angios table.  Both groins were prepped and draped in usual sterile fashion.  Local anesthesia was then treated of the left common femoral artery.  Ultrasound was used to identify left common femoral artery and femoral bifurcation.  This was patent.  Image was obtained for the patient's permanent medical record.  Using ultrasound guidance the left common femoral artery was successfully cannulated and an 035 versa core wire threaded up in the abdominal aorta under fluoroscopic guidance.  A 5 French sheath was then placed over the guidewire in the left common femoral artery and thoroughly flushed with heparinized saline.  5 French pigtail catheter was advanced over the guidewire and AP and lateral aortogram was obtained.  This showed a patent but significantly narrowed celiac and superior mesenteric artery origin.  The celiac artery had a 90% origin stenosis with poststenotic dilatation.  The superior mesenteric artery had a 80% stenosis which was more long and tubular but also involving the origin of the vessel.  The inferior mesenteric artery was not well visualized.  There is a left renal stent which is widely patent.  Renal arteries bilaterally are patent without significant narrowing.  Left and right common iliac arteries and internal iliac arteries are widely patent.  Area  of dissection noted in the right external iliac system on the CT scan was not significant on an AP projection.  At this point it was decided intervene on the mesenteric artery stenosis.  The 5 French sheath was swapped out over the guidewire for a 6-1/2 [place] Oscor sheath.  This articulating sheath was used to engage the superior mesenteric artery.  An 014 stabilizer wire was then fairly easily used to cross the lesion and advanced distally into the superior mesenteric artery.  Selective angiogram showed a lesion with a vessel diameter of about 6 mm and a length of about 2 cm.  A 6 x 27 mm stent was selected.  This was balloon expandable.  Due to the size of the stent we had to switch back to an [facility].  Therefore a 4 French straight catheter was advanced over the stabilizer wire and this was swapped out for an [employer] wire.  The stent was then positioned with the struts just adjacent to the aortic wall and at the SMA origin.  This was then inflated to nominal pressure for 1 minute.  Completion angiogram showed wide patency of the stent with good distal filling no evidence of dissection.  At this point the [facility] wire was removed and the Oscor sheath was advanced up to the level of the celiac.  This was then used to engage the celiac artery and again the stabilizer wire advanced across this.  A 6 x 15 balloon expandable stent was then centered on the lesion again with its origin back into the aorta adjacent to the aortic wall.  This was then deployed to nominal pressure for 1  minute.  Completion angiogram showed wide patency of the celiac and superior mesenteric arteries with no evidence of dissection and good filling of the distal branches.  Due to vessel overlap it was difficult to determine exactly which branches were filling antegrade or retrograde from the SMA or celiac but these all appeared intact with all of the major trunk branches of the celiac artery patent as well as the distal arcades of the  superior mesenteric artery.  At this point the Oscor sheath was pulled back over a Rosen wire and exchanged for a 7 [place] short sheath.  The sheath was left in place to be pulled in the holding area.  The patient was given a dose of 7000 units of intravenous heparin.  Initial ACT was around 200 so he was given an additional 3000 units of heparin with an ACT greater than 300 during the course of the procedure.  Final ACT was 235 upon leaving for the holding area.  The patient tolerated procedure well and there were no complications.  The patient was taken to the holding area in stable condition.  Operative management: The patient now has a widely patent celiac and superior mesenteric artery.  He will need to be maintained on Plavix and aspirin.  He will need a follow-up mesenteric duplex scan in 1 month.  Ruta Hinds, MD [facility] Office: [phone] Pager: [phone]

## 2018-07-16 NOTE — Progress Notes (Signed)
PT Cancellation Note  Patient Details Name: [name] MRN: [id number] DOB: 03/17/1945   Cancelled Treatment:    Reason Eval/Treat Not Completed: Patient at procedure or test/unavailable(pt at cath lab. Will follow. )  Philomena Doheny PT 07/16/2018  Acute Rehabilitation Services Pager [phone] Office [phone]

## 2018-07-16 NOTE — Progress Notes (Signed)
Site area: left groin fa sheath pulled and pressure held by [place] Ree Site Prior to Removal:  Level 0 Pressure Applied For:  20 minutes Manual:   yes Patient Status During Pull:  stable Post Pull Site:  Level 0 Post Pull Instructions Given:  yes Post Pull Pulses Present: left pt dopplered Dressing Applied:  Gauze and tegaderm Bedrest begins @ 1235 Comments:

## 2018-07-16 NOTE — Interval H&P Note (Signed)
History and Physical Interval Note:  07/16/2018 8:25 AM  [name]  has presented today for surgery, with the diagnosis of SMA stenosis.  The various methods of treatment have been discussed with the patient and family. After consideration of risks, benefits and other options for treatment, the patient has consented to  Procedure(s): VISCERAL ANGIOGRAPHY (N/A) as a surgical intervention.  The patient's history has been reviewed, patient examined, no change in status, stable for surgery.  I have reviewed the patient's chart and labs.  Questions were answered to the patient's satisfaction.     Ruta Hinds

## 2018-07-16 NOTE — Progress Notes (Addendum)
Patient given aspirin 81 EC po at 0800 and sent to cath lab via bed with cath lab staff transporting. IV fluids NS started just prior to transfer to cath lab and Heparin infusion dc'd at 753. Monitor changed from bedside to telemetry [id number], cardiac monitoring notified of change to telemetry and patient being transferred to cath lab.

## 2018-07-16 NOTE — Progress Notes (Signed)
Progress Note  Patient Name: [name] Date of Encounter: 07/16/2018  Primary Cardiologist: Shelva Majestic, MD  Subjective   Seen post vascular procedure/stenting. Doing well. Waking up from anesthesia, no complaints.  Inpatient Medications    Scheduled Meds: . aspirin EC  81 mg Oral Daily  . [START ON 07/17/2018] clopidogrel  75 mg Oral Q breakfast  . diltiazem  60 mg Oral Q6H  . dutasteride  0.5 mg Oral Daily  . ezetimibe  10 mg Oral Daily  . metoprolol tartrate  75 mg Oral BID  . pantoprazole  40 mg Oral Daily  . polyethylene glycol  17 g Oral BID  . pravastatin  40 mg Oral q1800  . ranolazine  500 mg Oral BID  . senna-docusate  1 tablet Oral BID  . sodium chloride flush  3 mL Intravenous Q12H  . traZODone  50 mg Oral Once   Continuous Infusions: . sodium chloride 100 mL/hr at 07/16/18 0930  . sodium chloride    . ceFEPime (MAXIPIME) IV 2 g (07/15/18 2112)   PRN Meds: sodium chloride, acetaminophen, alum & mag hydroxide-simeth, bisacodyl, hydrALAZINE, HYDROcodone-acetaminophen, labetalol, loratadine, methocarbamol, morphine injection, nitroGLYCERIN, ondansetron **OR** ondansetron (ZOFRAN) IV, oxyCODONE, sodium chloride flush, sodium phosphate, torsemide   Vital Signs    Vitals:   07/16/18 1200 07/16/18 1215 07/16/18 1220 07/16/18 1230  BP: (!) 132/38 131/85 (!) 132/48 (!) 122/59  Pulse: 88 76 (!) 36 85  Resp: 15 (!) 22 16 16   Temp:      TempSrc:      SpO2: 100% 99% 98% 99%  Weight:      Height:        Intake/Output Summary (Last 24 hours) at 07/16/2018 1607 Last data filed at 07/16/2018 0753 Gross per 24 hour  Intake 427.05 ml  Output 575 ml  Net -147.95 ml   Filed Weights   07/14/18 0433 07/15/18 0509 07/16/18 0443  Weight: 59.7 kg 62.6 kg 62.6 kg    Telemetry    Atrial fibrillation.  Personally reviewed.  ECG    Tracing from 07/14/2018 shows atrial fibrillation with right bundle branch block, old inferior infarct pattern, diffuse ST-T wave  abnormalities.  Personally reviewed.  Physical Exam   GEN:  Chronically ill-appearing male.  No acute distress.   Neck: No JVD. Cardiac:  Irregularly irregular, 2/6 systolic murmur, no gallop. Respiratory: Nonlabored. Clear to auscultation bilaterally. GI: Soft, bowel sounds present. MS: No edema; No deformity. Neuro:  Nonfocal. Psych: Alert and oriented x 3. Normal affect.  Labs    Chemistry Recent Labs  Lab 07/12/18 2336 07/13/18 0354 07/14/18 0337 07/15/18 0519 07/16/18 0207  NA 130* 128* 130* 131* 133*  K 3.5 4.0 3.1* 3.8 4.0  CL 100 93* 99 100 99  CO2 20* 21* 20* 20* 21*  GLUCOSE 123* 158* 102* 124* 124*  BUN 24* 28* 30* 32* 36*  CREATININE 0.99 1.20 1.24 1.31* 1.37*  CALCIUM 7.9* 8.6* 8.0* 8.1* 8.5*  PROT 5.4* 5.7* 5.2*  --   --   ALBUMIN 2.7* 2.8* 2.4*  --   --   AST 31 44* 46*  --   --   ALT 14 17 23   --   --   ALKPHOS 56 60 52  --   --   BILITOT 0.9 0.8 0.9  --   --   GFRNONAA >60 59* 57* 53* 50*  GFRAA >60 >60 >60 >60 58*  ANIONGAP 10 14 11 11  13  Hematology Recent Labs  Lab 07/14/18 0337 07/15/18 0519 07/16/18 0207  WBC 13.4* 10.8* 7.7  RBC 2.25* 2.18* 2.21*  HGB 7.5* 7.1* 7.1*  HCT 21.3* 20.7* 20.9*  MCV 94.7 95.0 94.6  MCH 33.3 32.6 32.1  MCHC 35.2 34.3 34.0  RDW 15.0 15.1 14.9  PLT 208 206 224    Cardiac Enzymes Recent Labs  Lab 07/13/18 0733 07/13/18 1338 07/13/18 2005  TROPONINI 5.16* 4.90* 4.00*   No results for input(s): TROPIPOC in the last 168 hours.    Radiology    No results found.  Cardiac Studies   Echocardiogram 08/24/2016: Study Conclusions  - Left ventricle: The cavity size was normal. Wall thickness was increased in a pattern of moderate LVH. Systolic function was normal. The estimated ejection fraction was in the range of 55% to 60%. Wall motion was normal; there were no regional wall motion abnormalities. The study is not technically sufficient to allow evaluation of LV diastolic function. -  Mitral valve: Calcified annulus. Mildly thickened leaflets . There was mild regurgitation. - Left atrium: The atrium was normal in size. - Right atrium: The atrium was mildly dilated. - Tricuspid valve: There was mild regurgitation. - Pulmonary arteries: PA peak pressure: 48 mm Hg (S). - Inferior vena cava: The vessel was dilated. The respirophasic diameter changes were blunted (<50%), consistent with elevated central venous pressure.  Impressions:  - Compared to a prior study in 2016, the LVEF is stable at 55-60%.  Patient Profile     73 y.o. male with a history of CAD status post CABG and redo operation most recently 1996, multiple PCI's, carotid arterectomy and renal artery stenting, atrial fibrillation, recent right hip fracture, now presenting with fever, abdominal pain, elevated troponin, ultimately with findings of mesenteric ischemia.  Assessment & Plan    NSTEMI, likely type II event in association with demand ischemia given anemia.  Known multivessel CAD s/p CABG and redo operation. -Peak troponin I 5.16.   -No active chest pain and ECG shows overall nonspecific ST-T wave abnormalities. No plans for further evaluation during this hospitalization. -Previously recommended for medical management, would continue this plan with aspirin, clopidogrel, metoprolol, pravastatin (lovastatin as an outpatient), ezetimibe, and ranolazine.   Persistent atrial fibrillation:  -rate control with metoprolol and diltiazem. Will increase metoprolol and decrease diltiazem today (to aid with medical management of CAD) -CHA2DS2/VAS Stroke Risk Points=4. Off apixaban given anemia/blood loss. Restart when stable from an anemia perspective.   Anemia, hemoglobin 7.1, likely combination of blood loss and chronic disease.  Mesenteric ischemia: s/p stent intervention today by Dr. Oneida Alar  TIME SPENT WITH PATIENT: 15 minutes of direct patient care. More than 50% of that time was spent on  coordination of care and counseling regarding med management.  Buford Dresser, MD, PhD [facility]   Signed, Buford Dresser, MD  07/16/2018, 4:07 PM

## 2018-07-17 ENCOUNTER — Encounter (HOSPITAL_COMMUNITY): Payer: Self-pay | Admitting: Vascular Surgery

## 2018-07-17 ENCOUNTER — Telehealth: Payer: Self-pay | Admitting: Student

## 2018-07-17 DIAGNOSIS — Z8781 Personal history of (healed) traumatic fracture: Secondary | ICD-10-CM

## 2018-07-17 LAB — CBC
HCT: 19.6 % — ABNORMAL LOW (ref 39.0–52.0)
Hemoglobin: 6.6 g/dL — CL (ref 13.0–17.0)
MCH: 32.4 pg (ref 26.0–34.0)
MCHC: 33.7 g/dL (ref 30.0–36.0)
MCV: 96.1 fL (ref 80.0–100.0)
Platelets: 187 10*3/uL (ref 150–400)
RBC: 2.04 MIL/uL — ABNORMAL LOW (ref 4.22–5.81)
RDW: 15 % (ref 11.5–15.5)
WBC: 8 10*3/uL (ref 4.0–10.5)
nRBC: 0 % (ref 0.0–0.2)

## 2018-07-17 LAB — CULTURE, BLOOD (ROUTINE X 2)
Culture: NO GROWTH
Culture: NO GROWTH
Special Requests: ADEQUATE
Special Requests: ADEQUATE

## 2018-07-17 LAB — BASIC METABOLIC PANEL
Anion gap: 7 (ref 5–15)
BUN: 29 mg/dL — ABNORMAL HIGH (ref 8–23)
CO2: 21 mmol/L — ABNORMAL LOW (ref 22–32)
Calcium: 8.4 mg/dL — ABNORMAL LOW (ref 8.9–10.3)
Chloride: 105 mmol/L (ref 98–111)
Creatinine, Ser: 1.38 mg/dL — ABNORMAL HIGH (ref 0.61–1.24)
GFR calc Af Amer: 58 mL/min — ABNORMAL LOW (ref >60)
GFR calc non Af Amer: 50 mL/min — ABNORMAL LOW (ref >60)
Glucose, Bld: 111 mg/dL — ABNORMAL HIGH (ref 70–99)
Potassium: 4.4 mmol/L (ref 3.5–5.1)
Sodium: 133 mmol/L — ABNORMAL LOW (ref 135–145)

## 2018-07-17 LAB — MAGNESIUM: Magnesium: 1.9 mg/dL (ref 1.7–2.4)

## 2018-07-17 LAB — HEPATIC FUNCTION PANEL
ALT: 20 U/L (ref 0–44)
AST: 21 U/L (ref 15–41)
Albumin: 2.5 g/dL — ABNORMAL LOW (ref 3.5–5.0)
Alkaline Phosphatase: 60 U/L (ref 38–126)
Bilirubin, Direct: 0.2 mg/dL (ref 0.0–0.2)
Indirect Bilirubin: 0.5 mg/dL (ref 0.3–0.9)
Total Bilirubin: 0.7 mg/dL (ref 0.3–1.2)
Total Protein: 5.2 g/dL — ABNORMAL LOW (ref 6.5–8.1)

## 2018-07-17 LAB — PREPARE RBC (CROSSMATCH)

## 2018-07-17 MED ORDER — SODIUM CHLORIDE 0.9% IV SOLUTION: Freq: Once | INTRAVENOUS | Status: DC

## 2018-07-17 MED ORDER — AMOXICILLIN-POT CLAVULANATE 875-125 MG PO TABS
1.0000 | ORAL_TABLET | Freq: Two times a day (BID) | ORAL | Status: AC
  Administered 2018-07-17 – 2018-07-18 (×4): 1 via ORAL
  Filled 2018-07-17 (×4): qty 1

## 2018-07-17 MED ORDER — METHOCARBAMOL 500 MG PO TABS
1000.0000 mg | ORAL_TABLET | Freq: Four times a day (QID) | ORAL | Status: AC | PRN
  Administered 2018-07-20: 21:00:00 1000 mg via ORAL
  Filled 2018-07-17: qty 2

## 2018-07-17 MED ORDER — METHOCARBAMOL 500 MG PO TABS
1500.0000 mg | ORAL_TABLET | Freq: Four times a day (QID) | ORAL | Status: AC
  Administered 2018-07-17 – 2018-07-18 (×4): 1500 mg via ORAL
  Filled 2018-07-17 (×5): qty 3

## 2018-07-17 NOTE — Progress Notes (Addendum)
  Progress Note    07/17/2018 7:29 AM 1 Day Post-Op  Subjective:  R hip bothering him from recent hip surgery   Vitals:   07/16/18 2344 07/17/18 0400  BP: 94/62 (!) 118/56  Pulse: 65 77  Resp: (!) 23 19  Temp: 98.2 F (36.8 C) 98.5 F (36.9 C)  SpO2: 100% 97%   Physical Exam: Lungs:  Non labored Incisions:  L groin cath site without palpable hematoma Extremities:  Palpable L femoral pulse; feet symmetrically warm to touch; L > R LE edema Abdomen: soft, NT, ND Neurologic: A&O  CBC    Component Value Date/Time   WBC 8.0 07/17/2018 0326   RBC 2.04 (L) 07/17/2018 0326   HGB 6.6 (LL) 07/17/2018 0326   HGB 11.4 (L) 05/30/2017 1411   HCT 19.6 (L) 07/17/2018 0326   HCT 33.5 (L) 05/30/2017 1411   PLT 187 07/17/2018 0326   PLT 163 05/30/2017 1411   MCV 96.1 07/17/2018 0326   MCV 91 05/30/2017 1411   MCH 32.4 07/17/2018 0326   MCHC 33.7 07/17/2018 0326   RDW 15.0 07/17/2018 0326   RDW 15.1 05/30/2017 1411   LYMPHSABS 0.7 07/14/2018 0337   MONOABS 0.9 07/14/2018 0337   EOSABS 0.1 07/14/2018 0337   BASOSABS 0.0 07/14/2018 0337    BMET    Component Value Date/Time   NA 133 (L) 07/17/2018 0326   NA 143 05/30/2017 1411   K 4.4 07/17/2018 0326   CL 105 07/17/2018 0326   CO2 21 (L) 07/17/2018 0326   GLUCOSE 111 (H) 07/17/2018 0326   BUN 29 (H) 07/17/2018 0326   BUN 22 05/30/2017 1411   CREATININE 1.38 (H) 07/17/2018 0326   CREATININE 1.12 10/14/2015 1117   CALCIUM 8.4 (L) 07/17/2018 0326   GFRNONAA 50 (L) 07/17/2018 0326   GFRAA 58 (L) 07/17/2018 0326    INR    Component Value Date/Time   INR 1.08 01/24/2011 0805     Intake/Output Summary (Last 24 hours) at 07/17/2018 0729 Last data filed at 07/17/2018 0423 Gross per 24 hour  Intake 667.05 ml  Output 850 ml  Net -182.95 ml     Assessment/Plan:  73 y.o. male is s/p celiac and SMA stenting 1 Day Post-Op   Abd exam benign, no post prandial pain L groin cath site unremarkable Hgb 6.6; defer transfusion  to primary team Patient will need to take 81mg  aspirin in addition to Eliquis Follow up in about 1 month for mesenteric duplex   Dagoberto Ligas, PA-C [facility] [phone] 07/17/2018 7:29 AM  Agree with above No abdominal pain Tolerating liquids No BM for several days No groin hematoma  Eliquis and ASA on d/c  Mesenteric duplex in 1 month  Ruta Hinds, MD [facility] [place] Office: [phone] Pager: [phone]

## 2018-07-17 NOTE — Consult Note (Signed)
Reason for Consult:s/p right hip ORIF Referring Physician: [name] is an 73 y.o. male.  HPI: [name] underwent IMN with sliding hip screw on the right side at [facility] 2 weeks ago. He was admitted here for respiratory complaints about 5d ago. As it's been two weeks he was supposed to have an appointment tomorrow for his staples to come out and also he's been having increased pain and orthopedic surgery was consulted. He notes the increased pain is coming from episodic muscle spasms that are affecting the entire operative leg and are worse in the last couple of days. He denies N/T, recent fevers, or drainage from his incisions.  Past Medical History:  Diagnosis Date  . Anemia   . Angina   . Atrial fibrillation ([place])   . Blood transfusion   . Bronchitis   . Bulging discs    "8 of them; thoracic, lumbar, sacral area"  . Chronic back pain   . Colon polyps   . Complication of anesthesia   . Coronary artery disease 01/24/11   Successful PCI long segmental stensois  vein graft to the CX marginal vessel-graft previously stented prox. 3.0x26mm TAXUS stent mid seg 3.0x52mm TAXUS stent now tandem stens of 3.0x46mm & 3.0x101mm placed with the seg. 50,80 & 99% stenosis reduced to 0%  . DDD (degenerative disc disease)   . Dysrhythmia    "PAC's and PVC's"  . Esophageal dilatation    "have had it done 7 times; last time ~ 2001"  . Esophageal stricture   . GERD (gastroesophageal reflux disease)   . Headache(784.0)   . Hiatal hernia   . Hyperlipidemia   . Myocardial infarction ([place]) 1991  . Myocardial infarction [facility]) 2005   "had 3 heart attacks this year"  . Paroxysmal atrial fibrillation ([facility])   . Pyloric stenosis   . RBBB   . Renal artery stenosis ([facility])    hhistory of right renal artery stenting and re-intervention for "in-stent restenosis.  . S/P CABG (coronary artery bypass graft) [place]  . Shortness of breath    "when I had the  heart attacks"  . Subclavian artery stenosis, left ([place])   . Ulcer    "small; Dr. Henrene Pastor found it 02/2010"    Past Surgical History:  Procedure Laterality Date  . ADENOIDECTOMY     "when I was an infant"  . APPENDECTOMY  1953  . CARDIOVERSION N/A 04/17/2017   Procedure: CARDIOVERSION;  Surgeon: Jerline Pain, MD;  Location: [facility] ENDOSCOPY;  Service: Cardiovascular;  Laterality: N/A;  . CAROTID ENDARTERECTOMY Right [place]  01/24/11   "today makes a total of 9 stents"  . CORONARY ARTERY BYPASS GRAFT  1984   CABG X 4  . CORONARY ARTERY BYPASS GRAFT  1996   CABG X 4  . LEFT HEART CATHETERIZATION WITH CORONARY/GRAFT ANGIOGRAM N/A 01/24/2011   Procedure: LEFT HEART CATHETERIZATION WITH Beatrix Fetters;  Surgeon: Troy Sine, MD;  Location: [facility] CATH LAB;  Service: Cardiovascular;  Laterality: N/A;  . PERIPHERAL VASCULAR INTERVENTION  07/16/2018   Procedure: PERIPHERAL VASCULAR INTERVENTION;  Surgeon: Elam Dutch, MD;  Location: [facility] CV LAB;  Service: Cardiovascular;;  SMA and Celiac  . pylondial cyst removal    . RETINAL DETACHMENT SURGERY  04/2007   right  . TONSILLECTOMY  1972  . VISCERAL ANGIOGRAPHY N/A 07/16/2018   Procedure: VISCERAL ANGIOGRAPHY;  Surgeon: Elam Dutch, MD;  Location: [facility] CV LAB;  Service: Cardiovascular;  Laterality: N/A;    Family History  Problem Relation Age of Onset  . Breast cancer Paternal Aunt   . Diabetes Sister   . Ulcers Father   . Heart disease Father   . Heart failure Father   . Heart attack Father   . AAA (abdominal aortic aneurysm) Father   . Stroke Mother   . Hypertension Mother   . Colon cancer Neg Hx     Social History:  reports that he quit smoking about 13 years ago. His smoking use included cigarettes. He has a 38.00 pack-year smoking history. He has never used smokeless tobacco. He reports that he does not drink alcohol or use drugs.  Allergies:   Allergies  Allergen Reactions  . Codeine Nausea And Vomiting  . Sulfa Antibiotics Rash  . Sulfonamide Derivatives Rash    Medications: I have reviewed the patient's current medications.  Results for orders placed or performed during the hospital encounter of 07/12/18 (from the past 48 hour(s))  APTT     Status: Abnormal   Collection Time: 07/15/18  6:08 PM  Result Value Ref Range   aPTT 70 (H) 24 - 36 seconds    Comment:        IF BASELINE aPTT IS ELEVATED, SUGGEST PATIENT RISK ASSESSMENT BE USED TO DETERMINE APPROPRIATE ANTICOAGULANT THERAPY. Performed at [facility], [place] [address] [place], [place] [address]   Heparin level (unfractionated)     Status: Abnormal   Collection Time: 07/16/18  2:07 AM  Result Value Ref Range   Heparin Unfractionated 1.24 (H) 0.30 - 0.70 IU/mL    Comment: RESULTS CONFIRMED BY MANUAL DILUTION (NOTE) If heparin results are below expected values, and patient dosage has  been confirmed, suggest follow up testing of antithrombin III levels. Performed at [facility], [place] [address] [place], [place] [address]   Basic metabolic panel     Status: Abnormal   Collection Time: 07/16/18  2:07 AM  Result Value Ref Range   Sodium 133 (L) 135 - 145 mmol/L   Potassium 4.0 3.5 - 5.1 mmol/L   Chloride 99 98 - 111 mmol/L   CO2 21 (L) 22 - 32 mmol/L   Glucose, Bld 124 (H) 70 - 99 mg/dL   BUN 36 (H) 8 - 23 mg/dL   Creatinine, Ser 1.37 (H) 0.61 - 1.24 mg/dL   Calcium 8.5 (L) 8.9 - 10.3 mg/dL   GFR calc non Af Amer 50 (L) >60 mL/min   GFR calc Af Amer 58 (L) >60 mL/min   Anion gap 13 5 - 15    Comment: Performed at [facility] [address], [place], [place] [address]  CBC     Status: Abnormal   Collection Time: 07/16/18  2:07 AM  Result Value Ref Range   WBC 7.7 4.0 - 10.5 K/uL   RBC 2.21 (L) 4.22 - 5.81 MIL/uL   Hemoglobin 7.1 (L) 13.0 - 17.0 g/dL   HCT 20.9 (L) 39.0 - 52.0 %   MCV 94.6 80.0 - 100.0 fL   MCH 32.1 26.0 - 34.0  pg   MCHC 34.0 30.0 - 36.0 g/dL   RDW 14.9 11.5 - 15.5 %   Platelets 224 150 - 400 K/uL   nRBC 0.0 0.0 - 0.2 %    Comment: Performed at [facility], [place] [address], [place], [place] [address]  APTT  Status: Abnormal   Collection Time: 07/16/18  2:07 AM  Result Value Ref Range   aPTT 57 (H) 24 - 36 seconds    Comment:        IF BASELINE aPTT IS ELEVATED, SUGGEST PATIENT RISK ASSESSMENT BE USED TO DETERMINE APPROPRIATE ANTICOAGULANT THERAPY. Performed at [facility], [place] [address] [place], [place] [address]   POCT Activated clotting time     Status: None   Collection Time: 07/16/18  9:07 AM  Result Value Ref Range   Activated Clotting Time 241 seconds  POCT Activated clotting time     Status: None   Collection Time: 07/16/18  9:22 AM  Result Value Ref Range   Activated Clotting Time 307 seconds  POCT Activated clotting time     Status: None   Collection Time: 07/16/18 10:23 AM  Result Value Ref Range   Activated Clotting Time 235 seconds  POCT Activated clotting time     Status: None   Collection Time: 07/16/18 11:15 AM  Result Value Ref Range   Activated Clotting Time 202 seconds  POCT Activated clotting time     Status: None   Collection Time: 07/16/18 12:01 PM  Result Value Ref Range   Activated Clotting Time 175 seconds  Basic metabolic panel      Status: Abnormal   Collection Time: 07/17/18  3:26 AM  Result Value Ref Range   Sodium 133 (L) 135 - 145 mmol/L   Potassium 4.4 3.5 - 5.1 mmol/L   Chloride 105 98 - 111 mmol/L   CO2 21 (L) 22 - 32 mmol/L   Glucose, Bld 111 (H) 70 - 99 mg/dL   BUN 29 (H) 8 - 23 mg/dL   Creatinine, Ser 1.38 (H) 0.61 - 1.24 mg/dL   Calcium 8.4 (L) 8.9 - 10.3 mg/dL   GFR calc non Af Amer 50 (L) >60 mL/min   GFR calc Af Amer 58 (L) >60 mL/min   Anion gap 7 5 - 15    Comment: Performed at [facility], [place] [address] [place], [place] [address]  CBC     Status: Abnormal   Collection Time: 07/17/18  3:26 AM  Result  Value Ref Range   WBC 8.0 4.0 - 10.5 K/uL   RBC 2.04 (L) 4.22 - 5.81 MIL/uL   Hemoglobin 6.6 (LL) 13.0 - 17.0 g/dL    Comment: REPEATED TO VERIFY THIS CRITICAL RESULT HAS VERIFIED AND BEEN CALLED TO Z.BURTON,RN BY MELISSA BROGDON ON 05 12 2020 AT 0413, AND HAS BEEN READ BACK.     HCT 19.6 (L) 39.0 - 52.0 %   MCV 96.1 80.0 - 100.0 fL   MCH 32.4 26.0 - 34.0 pg   MCHC 33.7 30.0 - 36.0 g/dL   RDW 15.0 11.5 - 15.5 %   Platelets 187 150 - 400 K/uL   nRBC 0.0 0.0 - 0.2 %    Comment: Performed at [facility] [address], [place], [place] [address]  Hepatic function panel     Status: Abnormal   Collection Time: 07/17/18  3:26 AM  Result Value Ref Range   Total Protein 5.2 (L) 6.5 - 8.1 g/dL   Albumin 2.5 (L) 3.5 - 5.0 g/dL   AST 21 15 - 41 U/L   ALT 20 0 - 44 U/L   Alkaline Phosphatase 60 38 - 126 U/L   Total Bilirubin 0.7 0.3 - 1.2 mg/dL   Bilirubin, Direct 0.2 0.0 - 0.2 mg/dL  Indirect Bilirubin 0.5 0.3 - 0.9 mg/dL    Comment: Performed at [facility], [place] [address], [place], [place] [address]  Magnesium     Status: None   Collection Time: 07/17/18  3:26 AM  Result Value Ref Range   Magnesium 1.9 1.7 - 2.4 mg/dL    Comment: Performed at [facility] [address], [place], [place] [address]  Type and screen [place]     Status: None (Preliminary result)   Collection Time: 07/17/18  7:32 AM  Result Value Ref Range   ABO/RH(D) PENDING    Antibody Screen PENDING    Sample Expiration      07/20/2018,2359 Performed at [facility], [place] [address], [place], [place] [address]   Prepare RBC     Status: None   Collection Time: 07/17/18  7:32 AM  Result Value Ref Range   Order Confirmation      ORDER PROCESSED BY BLOOD BANK Performed at [facility], [place] [address] [place], [place] [address]     No results found.  Review of Systems  Constitutional: Negative for weight loss.  HENT: Negative for ear discharge, ear pain,  hearing loss and tinnitus.   Eyes: Negative for blurred vision, double vision, photophobia and pain.  Respiratory: Negative for cough, sputum production and shortness of breath.   Cardiovascular: Negative for chest pain.  Gastrointestinal: Negative for abdominal pain, nausea and vomiting.  Genitourinary: Negative for dysuria, flank pain, frequency and urgency.  Musculoskeletal: Positive for joint pain (Right hip) and myalgias (Right leg). Negative for back pain, falls and neck pain.  Neurological: Negative for dizziness, tingling, sensory change, focal weakness, loss of consciousness and headaches.  Endo/Heme/Allergies: Does not bruise/bleed easily.  Psychiatric/Behavioral: Negative for depression, memory loss and substance abuse. The patient is not nervous/anxious.    Blood pressure (!) 118/56, pulse 77, temperature 98.5 F (36.9 C), temperature source Oral, resp. rate 19, height 5\' 5"  (1.651 m), weight 60.5 kg, SpO2 97 %. Physical Exam  Constitutional: He appears well-developed and well-nourished. No distress.  HENT:  Head: Normocephalic and atraumatic.  Eyes: Conjunctivae are normal. Right eye exhibits no discharge. Left eye exhibits no discharge. No scleral icterus.  Neck: Normal range of motion.  Cardiovascular: Normal rate and regular rhythm.  Respiratory: Effort normal. No respiratory distress.  Musculoskeletal:     Comments: LLE No traumatic wounds or rash, extensive ecchymoses hip and buttock, surgical incisions C/D/I  Mild TTP  No knee or ankle effusion  Knee stable to varus/ valgus and anterior/posterior stress  Sens DPN, SPN, TN intact  Motor EHL, ext, flex, evers 5/5  DP 2+, PT 2+, No significant edema  Neurological: He is alert.  Skin: Skin is warm and dry. He is not diaphoretic.  Psychiatric: He has a normal mood and affect. His behavior is normal.    Assessment/Plan: S/p ORIF right hip -- I suspect the extravasated blood is causing muscle and nerve irritation thus  resulting in the spasms. I will increase his muscle relaxer and schedule it for the next 48 hours. I reassured him that this problem will resolve itself with time. RN may remove staples, will write that order. He should f/u with his operative surgeon in another 4 weeks or so.    Lisette Abu, PA-C Orthopedic Surgery [phone] 07/17/2018, 12:53 PM

## 2018-07-17 NOTE — Progress Notes (Signed)
Patient ID: [name], male   DOB: 16-Feb-1946, 73 y.o.   MRN: [id number]  PROGRESS NOTE    [name]  HMC:[id number] DOB: 1945-07-20 DOA: 07/12/2018 PCP: Garwin Brothers, MD   Brief Narrative:  73 year old male with history of coronary artery disease status post CABG x2 in 1984 and 1996, subsequent MI with multiple PCI's, peripheral vascular disease status post CEA in 1906 and renal artery stenting in 2005, paroxysmal A. fib on Eliquis, GERD and recent right hip fracture status post repair at [facility] and subsequent discharged on 07/07/2018 was transferred from [facility] for fever, cough and shortness of breath.  COVID-19 testing was negative.  Troponin was 2.70.  CT angiogram of the chest showed no PE but areas of mild mucus impaction in the right lower lobe along with severe stenosis of the SMA origin.  Cardiology was consulted and patient was transferred to [facility] and started on heparin drip.  He was also started on broad-spectrum antibiotics.  He was also found to have severe SMA stenosis.  Vascular surgery was consulted.  Assessment & Plan:   Principal Problem:   NSTEMI (non-ST elevated myocardial infarction) [facility]) Active Problems:   Essential hypertension   Hx of CABG '84, '96, last PCI 2012   GASTROESOPHAGEAL REFLUX DISEASE   PVD (peripheral vascular disease) ([place])   Carotid disease, bilateral ([place])   Hyperlipidemia LDL goal <70   Paroxysmal atrial fibrillation (HCC)   Coronary artery disease   Fever   Lobar pneumonia (HCC)   Mesenteric ischemia (HCC)  Positive troponin/probable non-STEMI in a patient with known history of coronary artery disease/CABG and history of multiple PCI's -Troponin on presentation was 2.7.  Troponin peaked to 5.16 and is currently trending down.  Last troponin was 4.0.  -Patient never had any chest pain.  -Cardiology following.  Continue medical management for now.  Treated with heparin drip.  Currently off heparin drip due to  drop in hemoglobin.  Continue aspirin, metoprolol, statin, ranolazine and ezetimibe.  DC Plavix  Probable healthcare associated pneumonia -CT angiogram of the chest showed no PE but areas of mild mucus impaction in the right lower lobe -COVID-19 testing negative -Currently on room air.   -Currently on cefepime. Off vancomycin.  Switch to oral Augmentin and stop antibiotic on 07/18/2018. -Diet as per SLP recommendation  Leukocytosis -From above.  Resolved  Hyponatremia -From poor oral intake.  Improving.  Off IV fluids  Hypokalemia -Improved.  Constipation -Continue current laxative regimen.   Paroxysmal A. fib with RVR -Currently rate controlled.  Continue Cardizem and beta-blocker if blood pressure allows.  Patient was on heparin drip and Eliquis was held.  Due to drop in hemoglobin, heparin drip has been discontinued.  We will keep Eliquis on hold for now and once hemoglobin improves, will resume Eliquis.  Cardiology following.  Significant origin stenosis of the celiac axis, SMA and IMA with probable ischemic colitis -Vascular surgery following. -Status post angiography and celiac artery stenting and superior mesenteric artery stenting on 07/16/2018.  Follow further vascular surgery recommendations. -Currently on aspirin and Plavix.  Vascular surgery recommends aspirin and Eliquis on discharge with outpatient follow-up with vascular surgery.  Will stop Plavix.  Restart Eliquis once hemoglobin is more stable.  Right hip fracture status post recent right hip surgery -Right hip incisional wound looks fine.  No evidence of hip infection.   -Patient complains of intermittent right hip pain and could not sleep at all last night.  He stated that  he was supposed to follow-up with orthopedics at [facility] but his appointment had to be postponed.  He was told by his orthopedics team at [facility] that the staples could be removed at [facility].  I spoke to the orthopedics  PA/Michael Dellis Filbert and discussed the case with him.  Will await further orthopedics evaluation and recommendations.   Chronic anemia -Questionable cause.  No signs of bleeding.  -Hemoglobin 6.6 this morning.  Transfuse 1 unit packed red cells.  Monitor  Peripheral vascular disease -Continue with statin  GERD -Continue with PPIs  Hyperlipidemia--continue with statin  Chronic back pain -Continue home regimen of pain medications   DVT prophylaxis: Heparin drip Code Status: Full Family Communication: None at bedside Disposition Plan: Home in 1 to 2 days once cleared by cardiology and vascular surgery  Consultants: Cardiology/vascular surgery orthopedic surgery  Procedures: Angiogram and superior mesenteric artery and celiac artery stenting  Antimicrobials: cefepime from 07/12/2018 onwards Vancomycin 07/12/2018-07/14/2018   Subjective: Patient seen and examined at bedside.  Patient denies any overnight fever, nausea or vomiting.  No black or bloody stools.  No current abdominal pain.  States that he could not sleep at all last night because of his right hip pain. Objective: Vitals:   07/16/18 2035 07/16/18 2344 07/17/18 0400 07/17/18 0500  BP:  94/62 (!) 118/56   Pulse: 97 65 77   Resp: (!) 24 (!) 23 19   Temp:  98.2 F (36.8 C) 98.5 F (36.9 C)   TempSrc:  Oral Oral   SpO2: 98% 100% 97%   Weight:    60.5 kg  Height:        Intake/Output Summary (Last 24 hours) at 07/17/2018 0732 Last data filed at 07/17/2018 0423 Gross per 24 hour  Intake 667.05 ml  Output 850 ml  Net -182.95 ml   Filed Weights   07/15/18 0509 07/16/18 0443 07/17/18 0500  Weight: 62.6 kg 62.6 kg 60.5 kg    Examination:  General exam: No acute distress. Respiratory system: Bilateral decreased breath sounds at bases with some scattered crackles.   cardiovascular system: S1-S2 heard, rate controlled Gastrointestinal system: Abdomen is slightly distended, soft and nontender.  Bowel sounds heard   extremities: No cyanosis; trace edema.   CNS: Alert and awake and oriented.  No focal neurologic deficit.  Moving extremities  Skin: Ecchymosis around the right hip region.   Extremities: No cyanosis, edema.  Right hip incision with staples present with no discharge with mild tenderness Lymph: No cervical lymphadenopathy Psychiatric: Flat affect   Data Reviewed: I have personally reviewed following labs and imaging studies  CBC: Recent Labs  Lab 07/12/18 2336 07/13/18 0354 07/14/18 0337 07/15/18 0519 07/16/18 0207 07/17/18 0326  WBC 19.4* 23.2* 13.4* 10.8* 7.7 8.0  NEUTROABS 17.3*  --  11.6*  --   --   --   HGB 7.9* 8.7* 7.5* 7.1* 7.1* 6.6*  HCT 23.1* 25.3* 21.3* 20.7* 20.9* 19.6*  MCV 96.3 97.7 94.7 95.0 94.6 96.1  PLT 222 236 208 206 224 144   Basic Metabolic Panel: Recent Labs  Lab 07/13/18 0354 07/14/18 0337 07/15/18 0519 07/16/18 0207 07/17/18 0326  NA 128* 130* 131* 133* 133*  K 4.0 3.1* 3.8 4.0 4.4  CL 93* 99 100 99 105  CO2 21* 20* 20* 21* 21*  GLUCOSE 158* 102* 124* 124* 111*  BUN 28* 30* 32* 36* 29*  CREATININE 1.20 1.24 1.31* 1.37* 1.38*  CALCIUM 8.6* 8.0* 8.1* 8.5* 8.4*  MG  --  1.8 1.7  --  1.9   GFR: Estimated Creatinine Clearance: 40.2 mL/min (A) (by C-G formula based on SCr of 1.38 mg/dL (H)). Liver Function Tests: Recent Labs  Lab 07/12/18 2336 07/13/18 0354 07/14/18 0337 07/17/18 0326  AST 31 44* 46* 21  ALT 14 17 23 20   ALKPHOS 56 60 52 60  BILITOT 0.9 0.8 0.9 0.7  PROT 5.4* 5.7* 5.2* 5.2*  ALBUMIN 2.7* 2.8* 2.4* 2.5*   No results for input(s): LIPASE, AMYLASE in the last 168 hours. No results for input(s): AMMONIA in the last 168 hours. Coagulation Profile: No results for input(s): INR, PROTIME in the last 168 hours. Cardiac Enzymes: Recent Labs  Lab 07/13/18 0733 07/13/18 1338 07/13/18 2005  TROPONINI 5.16* 4.90* 4.00*   BNP (last 3 results) No results for input(s): PROBNP in the last 8760 hours. HbA1C: No results for  input(s): HGBA1C in the last 72 hours. CBG: No results for input(s): GLUCAP in the last 168 hours. Lipid Profile: No results for input(s): CHOL, HDL, LDLCALC, TRIG, CHOLHDL, LDLDIRECT in the last 72 hours. Thyroid Function Tests: No results for input(s): TSH, T4TOTAL, FREET4, T3FREE, THYROIDAB in the last 72 hours. Anemia Panel: No results for input(s): VITAMINB12, FOLATE, FERRITIN, TIBC, IRON, RETICCTPCT in the last 72 hours. Sepsis Labs: Recent Labs  Lab 07/13/18 1009 07/13/18 1338 07/13/18 1711  PROCALCITON 0.78  --   --   LATICACIDVEN 2.3* 1.6 1.8    Recent Results (from the past 240 hour(s))  Culture, blood (Routine X 2) w Reflex to ID Panel     Status: None (Preliminary result)   Collection Time: 07/12/18 11:36 PM  Result Value Ref Range Status   Specimen Description BLOOD RIGHT ARM  Final   Special Requests   Final    BOTTLES DRAWN AEROBIC ONLY Blood Culture adequate volume   Culture   Final    NO GROWTH 4 DAYS Performed at [facility], [address], [place], [place] [address]    Report Status PENDING  Incomplete  Culture, blood (Routine X 2) w Reflex to ID Panel     Status: None (Preliminary result)   Collection Time: 07/12/18 11:38 PM  Result Value Ref Range Status   Specimen Description BLOOD RIGHT HAND  Final   Special Requests   Final    BOTTLES DRAWN AEROBIC ONLY Blood Culture adequate volume   Culture   Final    NO GROWTH 4 DAYS Performed at [facility], [place] [address] [place], [place] [address]    Report Status PENDING  Incomplete  SARS Coronavirus 2 (CEPHEID- Performed in [place] hospital lab), Hosp Order     Status: None   Collection Time: 07/13/18  9:42 AM  Result Value Ref Range Status   SARS Coronavirus 2 NEGATIVE NEGATIVE Final    Comment: (NOTE) If result is NEGATIVE SARS-CoV-2 target nucleic acids are NOT DETECTED. The SARS-CoV-2 RNA is generally detectable in upper and lower  respiratory specimens during the acute phase of  infection. The lowest  concentration of SARS-CoV-2 viral copies this assay can detect is 250  copies / mL. A negative result does not preclude SARS-CoV-2 infection  and should not be used as the sole basis for treatment or other  patient management decisions.  A negative result may occur with  improper specimen collection / handling, submission of specimen other  than nasopharyngeal swab, presence of viral mutation(s) within the  areas targeted by this assay, and inadequate number of viral  copies  (<250 copies / mL). A negative result must be combined with clinical  observations, patient history, and epidemiological information. If result is POSITIVE SARS-CoV-2 target nucleic acids are DETECTED. The SARS-CoV-2 RNA is generally detectable in upper and lower  respiratory specimens dur ing the acute phase of infection.  Positive  results are indicative of active infection with SARS-CoV-2.  Clinical  correlation with patient history and other diagnostic information is  necessary to determine patient infection status.  Positive results do  not rule out bacterial infection or co-infection with other viruses. If result is PRESUMPTIVE POSTIVE SARS-CoV-2 nucleic acids MAY BE PRESENT.   A presumptive positive result was obtained on the submitted specimen  and confirmed on repeat testing.  While 2019 novel coronavirus  (SARS-CoV-2) nucleic acids may be present in the submitted sample  additional confirmatory testing may be necessary for epidemiological  and / or clinical management purposes  to differentiate between  SARS-CoV-2 and other Sarbecovirus currently known to infect humans.  If clinically indicated additional testing with an alternate test  methodology [id number]) is advised. The SARS-CoV-2 RNA is generally  detectable in upper and lower respiratory sp ecimens during the acute  phase of infection. The expected result is Negative. Fact Sheet for Patients:   StrictlyIdeas.no Fact Sheet for Healthcare Providers: BankingDealers.co.za This test is not yet approved or cleared by the [place] FDA and has been authorized for detection and/or diagnosis of SARS-CoV-2 by FDA under an Emergency Use Authorization (EUA).  This EUA will remain in effect (meaning this test can be used) for the duration of the COVID-19 declaration under Section 564(b)(1) of the Act, 21 U.S.C. section 360bbb-3(b)(1), unless the authorization is terminated or revoked sooner. Performed at [facility], [place] [address], [place], [place] [address]          Radiology Studies: No results found.      Scheduled Meds: . sodium chloride   Intravenous Once  . aspirin EC  81 mg Oral Daily  . clopidogrel  75 mg Oral Q breakfast  . diltiazem  30 mg Oral Q6H  . dutasteride  0.5 mg Oral Daily  . ezetimibe  10 mg Oral Daily  . metoprolol tartrate  100 mg Oral BID  . pantoprazole  40 mg Oral Daily  . polyethylene glycol  17 g Oral BID  . pravastatin  40 mg Oral q1800  . ranolazine  500 mg Oral BID  . senna-docusate  1 tablet Oral BID  . sodium chloride flush  3 mL Intravenous Q12H  . traZODone  50 mg Oral Once   Continuous Infusions: . sodium chloride 100 mL/hr at 07/16/18 0930  . sodium chloride    . ceFEPime (MAXIPIME) IV 2 g (07/16/18 2103)     LOS: 5 days        Aline August, MD [facility] 07/17/2018, 7:32 AM

## 2018-07-17 NOTE — Progress Notes (Signed)
Lamar Blinks, MD notified of critical Hgb 6.6. No new orders at this time. Pt asymptomatic. Will continue to monitor.

## 2018-07-17 NOTE — Telephone Encounter (Signed)
   TELEPHONE CALL NOTE  This patient has been deemed a candidate for follow-up tele-health visit to limit community exposure during the Covid-19 pandemic. I spoke with the patient via phone to discuss instructions. This has been outlined on the patient's AVS (dotphrase: hcevisitinfo). The patient was advised to review the section on consent for treatment as well. The patient will receive a phone call 2-3 days prior to their E-Visit at which time consent will be verbally confirmed. A Virtual Office Visit appointment type has been scheduled for 08/02/2018 with Almyra Deforest, with "VIDEO" or "TELEPHONE" in the appointment notes - patient prefers telephone type (patient does not have a smart phone or a computer with a camera).  I have either confirmed the patient is active in MyChart or offered to send sign-up link to phone/email via Mychart icon beside patient's photo.  Darreld Mclean, PA-C 07/17/2018 12:40 PM

## 2018-07-17 NOTE — Progress Notes (Signed)
Progress Note  Patient Name: [name] Date of Encounter: 07/17/2018  Primary Cardiologist: Shelva Majestic, MD  Subjective   Doing well overall. Mild muscle soreness in his hips. No complaints otherwise. No acute events overnight.  Inpatient Medications    Scheduled Meds: . sodium chloride   Intravenous Once  . amoxicillin-clavulanate  1 tablet Oral Q12H  . aspirin EC  81 mg Oral Daily  . diltiazem  30 mg Oral Q6H  . dutasteride  0.5 mg Oral Daily  . ezetimibe  10 mg Oral Daily  . methocarbamol  1,500 mg Oral QID  . metoprolol tartrate  100 mg Oral BID  . pantoprazole  40 mg Oral Daily  . polyethylene glycol  17 g Oral BID  . pravastatin  40 mg Oral q1800  . ranolazine  500 mg Oral BID  . senna-docusate  1 tablet Oral BID  . sodium chloride flush  3 mL Intravenous Q12H  . traZODone  50 mg Oral Once   Continuous Infusions: . sodium chloride     PRN Meds: sodium chloride, acetaminophen, alum & mag hydroxide-simeth, bisacodyl, hydrALAZINE, HYDROcodone-acetaminophen, labetalol, loratadine, methocarbamol **FOLLOWED BY** [START ON 07/19/2018] methocarbamol, morphine injection, nitroGLYCERIN, ondansetron **OR** ondansetron (ZOFRAN) IV, oxyCODONE, sodium chloride flush, sodium phosphate, torsemide   Vital Signs    Vitals:   07/16/18 2035 07/16/18 2344 07/17/18 0400 07/17/18 0500  BP:  94/62 (!) 118/56   Pulse: 97 65 77   Resp: (!) 24 (!) 23 19   Temp:  98.2 F (36.8 C) 98.5 F (36.9 C)   TempSrc:  Oral Oral   SpO2: 98% 100% 97%   Weight:    60.5 kg  Height:        Intake/Output Summary (Last 24 hours) at 07/17/2018 1259 Last data filed at 07/17/2018 0423 Gross per 24 hour  Intake 240 ml  Output 850 ml  Net -610 ml   Filed Weights   07/15/18 0509 07/16/18 0443 07/17/18 0500  Weight: 62.6 kg 62.6 kg 60.5 kg    Telemetry    Atrial fibrillation, improving rate control.  Personally reviewed.  ECG    Tracing from 07/14/2018 shows atrial fibrillation with right  bundle branch block, old inferior infarct pattern, diffuse ST-T wave abnormalities.  Personally reviewed.  Physical Exam   GEN:  Chronically ill-appearing male.  No acute distress.   Neck: No JVD. Cardiac:  Irregularly irregular, 2/6 systolic murmur, no gallop. Respiratory: Nonlabored. Clear to auscultation bilaterally. GI: Soft, bowel sounds present. MS: Trace pitting bilateral LE edema; No deformity. Neuro:  Nonfocal. Psych: Alert and oriented x 3. Normal affect.  Labs    Chemistry Recent Labs  Lab 07/13/18 0354 07/14/18 0337 07/15/18 0519 07/16/18 0207 07/17/18 0326  NA 128* 130* 131* 133* 133*  K 4.0 3.1* 3.8 4.0 4.4  CL 93* 99 100 99 105  CO2 21* 20* 20* 21* 21*  GLUCOSE 158* 102* 124* 124* 111*  BUN 28* 30* 32* 36* 29*  CREATININE 1.20 1.24 1.31* 1.37* 1.38*  CALCIUM 8.6* 8.0* 8.1* 8.5* 8.4*  PROT 5.7* 5.2*  --   --  5.2*  ALBUMIN 2.8* 2.4*  --   --  2.5*  AST 44* 46*  --   --  21  ALT 17 23  --   --  20  ALKPHOS 60 52  --   --  60  BILITOT 0.8 0.9  --   --  0.7  GFRNONAA 59* 57* 53* 50* 50*  GFRAA >60 >  60 >60 58* 58*  ANIONGAP 14 11 11 13 7      Hematology Recent Labs  Lab 07/15/18 0519 07/16/18 0207 07/17/18 0326  WBC 10.8* 7.7 8.0  RBC 2.18* 2.21* 2.04*  HGB 7.1* 7.1* 6.6*  HCT 20.7* 20.9* 19.6*  MCV 95.0 94.6 96.1  MCH 32.6 32.1 32.4  MCHC 34.3 34.0 33.7  RDW 15.1 14.9 15.0  PLT 206 224 187    Cardiac Enzymes Recent Labs  Lab 07/13/18 0733 07/13/18 1338 07/13/18 2005  TROPONINI 5.16* 4.90* 4.00*   No results for input(s): TROPIPOC in the last 168 hours.    Radiology    No results found.  Cardiac Studies   Echocardiogram 08/24/2016: Study Conclusions  - Left ventricle: The cavity size was normal. Wall thickness was increased in a pattern of moderate LVH. Systolic function was normal. The estimated ejection fraction was in the range of 55% to 60%. Wall motion was normal; there were no regional wall motion abnormalities.  The study is not technically sufficient to allow evaluation of LV diastolic function. - Mitral valve: Calcified annulus. Mildly thickened leaflets . There was mild regurgitation. - Left atrium: The atrium was normal in size. - Right atrium: The atrium was mildly dilated. - Tricuspid valve: There was mild regurgitation. - Pulmonary arteries: PA peak pressure: 48 mm Hg (S). - Inferior vena cava: The vessel was dilated. The respirophasic diameter changes were blunted (<50%), consistent with elevated central venous pressure.  Impressions:  - Compared to a prior study in 2016, the LVEF is stable at 55-60%.  Patient Profile     73 y.o. male with a history of CAD status post CABG and redo operation most recently 1996, multiple PCI's, carotid arterectomy and renal artery stenting, atrial fibrillation, recent right hip fracture, now presenting with fever, abdominal pain, elevated troponin, ultimately with findings of mesenteric ischemia.  Assessment & Plan    NSTEMI, likely type II event in association with demand ischemia given anemia.  Known multivessel CAD s/p CABG and redo operation. -Peak troponin I 5.16.   -No active chest pain and ECG shows overall nonspecific ST-T wave abnormalities. No plans for further evaluation during this hospitalization. -Previously recommended for medical management, would continue this plan with aspirin, metoprolol, pravastatin (lovastatin as an outpatient), ezetimibe, and ranolazine.   Persistent atrial fibrillation:  -rate control with metoprolol and diltiazem. Maxed metoprolol dose yesterday. Requiring equivalent of 120 mg daily diltiazem for rate control currently. His HR may be elevated due to the anemia, so will continue fractionated diltiazem for now. As anemia improves, if HR slows, can stop the standing diltiazem and use PRN. -CHA2DS2/VAS Stroke Risk Points=4. Off apixaban given anemia/blood loss. Restart when stable from an anemia perspective.    Anemia, hemoglobin 6.6, likely combination of blood loss and chronic disease. Defer transfusion to primary team, but would recommend keeping Hgb at least >8 given known CAD and troponin elevation from demand this hospitalization.  Mesenteric ischemia: s/p stent intervention 5/11 by Dr. Susa [facility] will sign off.   Medication Recommendations:  Aspirin 81 mg, defer to need for clopidogrel to vascular surgery given anemia, metoprolol tartrate 100 mg BID, lovastatin 20 mg at bedtime, ezetimibe 10 mg daily, ranolazine 500 mg BID. Expect that as anemia improves, HR may improve as well, but if needed current dose of diltiazem can be consolidated to 120 mg daily at discharge. Torsemide and diltiazem ok to continue PRN as outpatient. Restart apixaban when stable from an anemia perspective. Other recommendations (  labs, testing, etc):  Per primary team Follow up as an outpatient:  Has appt with Almyra Deforest on 08/02/18  TIME SPENT WITH PATIENT: 15 minutes of direct patient care. More than 50% of that time was spent on coordination of care and counseling regarding med management.  Buford Dresser, MD, PhD [facility]   Signed, Buford Dresser, MD  07/17/2018, 12:59 PM

## 2018-07-17 NOTE — Care Management Important Message (Signed)
Important Message  Patient Details  Name: [name] MRN: [id number] Date of Birth: [birth year]   Medicare Important Message Given:  Yes    [place] Dominique Montine Circle 07/17/2018, 2:58 PM

## 2018-07-17 NOTE — Evaluation (Signed)
Physical Therapy Evaluation Patient Details Name: [name] MRN: [id number] DOB: 08-Nov-1945 Today's Date: 07/17/2018   History of Present Illness  73 year old male with history of coronary artery disease status post CABG x2 in 1984 and 1996, subsequent MI with multiple PCI's, peripheral vascular disease status post CEA in 1906 and renal artery stenting in 2005, paroxysmal A. fib on Eliquis, GERD and recent right hip fracture status post repair at [facility] and subsequent discharged on 07/07/2018 was transferred from [facility] for fever, cough and shortness of breath.  COVID-19 testing was negative.  Troponin was 2.70.  CT angiogram of the chest showed no PE but areas of mild mucus impaction in the right lower lobe along with severe stenosis of the SMA origin.  Cardiology was consulted and patient was transferred to [facility] and started on heparin drip.  He was also started on broad-spectrum antibiotics.  He was also found to have severe SMA stenosis.  Vascular surgery was consulted.  Status post angiography and celiac artery stenting and superior mesenteric artery stenting on 07/16/2018.  Clinical Impression  Pt admitted with/for fever/cough, but found to have mesenteric ischemia s/p stenting.  Pt is not at baseline functioning, but is at a min guard level of mobility.  Pt currently limited functionally due to the problems listed below.  (see problems list.)  Pt will benefit from PT to maximize function and safety to be able to get home safely with available assist.     Follow Up Recommendations Home health PT    Equipment Recommendations  None recommended by PT    Recommendations for Other Services       Precautions / Restrictions Precautions Precautions: Fall Precaution Comments: low Hgb today Restrictions Weight Bearing Restrictions: Yes RLE Weight Bearing: Weight bearing as tolerated      Mobility  Bed Mobility Overal bed mobility: Needs Assistance Bed  Mobility: Supine to Sit     Supine to sit: Min assist     General bed mobility comments: pt just stiff and needed scooting and stability assist  Transfers Overall transfer level: Needs assistance   Transfers: Sit to/from Stand Sit to Stand: Min guard         General transfer comment: safe transfer technique  Ambulation/Gait Ambulation/Gait assistance: Min guard Gait Distance (Feet): 15 Feet(x2 around the bed and then back after sitting.) Assistive device: Rolling walker (2 wheeled) Gait Pattern/deviations: Step-through pattern     General Gait Details: generally steady and needing no assist, but due to very low Hgb and mild initial dizziness, pt was guarded for safety.  Stairs            Wheelchair Mobility    Modified Rankin (Stroke Patients Only)       Balance Overall balance assessment: Mild deficits observed, not formally tested                                           Pertinent Vitals/Pain Pain Assessment: Faces Faces Pain Scale: Hurts a little bit Pain Location: right hip Pain Descriptors / Indicators: Aching;Sore Pain Intervention(s): Monitored during session    Home Living Family/patient expects to be discharged to:: Private residence Living Arrangements: Spouse/significant other Available Help at Discharge: Family;Available 24 hours/day(wife does work, but is presently not due to Covid 19) Type of Home: House Home Access: Stairs to enter Entrance Stairs-Rails: (storm door) Entrance Stairs-Number of  Steps: 2 Home Layout: One level Home Equipment: Walker - 2 wheels;Cane - quad;Bedside commode;Toilet riser;Tub bench      Prior Function Level of Independence: Independent;Independent with assistive device(s)               Hand Dominance        Extremity/Trunk Assessment   Upper Extremity Assessment Upper Extremity Assessment: Overall WFL for tasks assessed    Lower Extremity Assessment Lower Extremity  Assessment: Overall WFL for tasks assessed       Communication   Communication: No difficulties  Cognition Arousal/Alertness: Awake/alert Behavior During Therapy: WFL for tasks assessed/performed Overall Cognitive Status: Within Functional Limits for tasks assessed                                        General Comments General comments (skin integrity, edema, etc.): vss, sats in the mid 90's and EHR 105 bpm    Exercises     Assessment/Plan    PT Assessment Patient needs continued PT services  PT Problem List Decreased activity tolerance;Decreased balance;Decreased mobility;Decreased knowledge of use of DME;Pain       PT Treatment Interventions DME instruction;Gait training;Stair training;Therapeutic activities;Patient/family education    PT Goals (Current goals can be found in the Care Plan section)  Acute Rehab PT Goals Patient Stated Goal: back fully independent PT Goal Formulation: With patient Time For Goal Achievement: 07/31/18 Potential to Achieve Goals: Good    Frequency Min 3X/week   Barriers to discharge        Co-evaluation               AM-PAC PT "6 Clicks" Mobility  Outcome Measure Help needed turning from your back to your side while in a flat bed without using bedrails?: A Little Help needed moving from lying on your back to sitting on the side of a flat bed without using bedrails?: A Little Help needed moving to and from a bed to a chair (including a wheelchair)?: A Little Help needed standing up from a chair using your arms (e.g., wheelchair or bedside chair)?: A Little Help needed to walk in hospital room?: A Little Help needed climbing 3-5 steps with a railing? : A Little 6 Click Score: 18    End of Session   Activity Tolerance: Patient tolerated treatment well Patient left: in chair;with call bell/phone within reach Nurse Communication: Mobility status PT Visit Diagnosis: Other abnormalities of gait and mobility  (R26.89);Difficulty in walking, not elsewhere classified (R26.2)    Time: 5625-6389 PT Time Calculation (min) (ACUTE ONLY): 25 min   Charges:   PT Evaluation $PT Eval Moderate Complexity: 1 Mod PT Treatments $Gait Training: 8-22 mins        07/17/2018  Donnella Sham, PT Acute Rehabilitation Services [phone]  (pager) [phone]  (office)  Tessie Fass Zohan Shiflet 07/17/2018, 4:36 PM

## 2018-07-18 ENCOUNTER — Encounter (HOSPITAL_COMMUNITY): Payer: Self-pay

## 2018-07-18 LAB — CBC WITH DIFFERENTIAL/PLATELET
Abs Immature Granulocytes: 0.29 10*3/uL — ABNORMAL HIGH (ref 0.00–0.07)
Basophils Absolute: 0 10*3/uL (ref 0.0–0.1)
Basophils Relative: 0 %
Eosinophils Absolute: 0.1 10*3/uL (ref 0.0–0.5)
Eosinophils Relative: 1 %
HCT: 22.7 % — ABNORMAL LOW (ref 39.0–52.0)
Hemoglobin: 7.9 g/dL — ABNORMAL LOW (ref 13.0–17.0)
Immature Granulocytes: 3 %
Lymphocytes Relative: 8 %
Lymphs Abs: 0.7 10*3/uL (ref 0.7–4.0)
MCH: 32.1 pg (ref 26.0–34.0)
MCHC: 34.8 g/dL (ref 30.0–36.0)
MCV: 92.3 fL (ref 80.0–100.0)
Monocytes Absolute: 0.9 10*3/uL (ref 0.1–1.0)
Monocytes Relative: 10 %
Neutro Abs: 7 10*3/uL (ref 1.7–7.7)
Neutrophils Relative %: 78 %
Platelets: 178 10*3/uL (ref 150–400)
RBC: 2.46 MIL/uL — ABNORMAL LOW (ref 4.22–5.81)
RDW: 16.5 % — ABNORMAL HIGH (ref 11.5–15.5)
WBC: 9 10*3/uL (ref 4.0–10.5)
nRBC: 0 % (ref 0.0–0.2)

## 2018-07-18 LAB — BASIC METABOLIC PANEL
Anion gap: 10 (ref 5–15)
BUN: 27 mg/dL — ABNORMAL HIGH (ref 8–23)
CO2: 23 mmol/L (ref 22–32)
Calcium: 8.6 mg/dL — ABNORMAL LOW (ref 8.9–10.3)
Chloride: 99 mmol/L (ref 98–111)
Creatinine, Ser: 1.36 mg/dL — ABNORMAL HIGH (ref 0.61–1.24)
GFR calc Af Amer: 59 mL/min — ABNORMAL LOW (ref >60)
GFR calc non Af Amer: 51 mL/min — ABNORMAL LOW (ref >60)
Glucose, Bld: 133 mg/dL — ABNORMAL HIGH (ref 70–99)
Potassium: 4.3 mmol/L (ref 3.5–5.1)
Sodium: 132 mmol/L — ABNORMAL LOW (ref 135–145)

## 2018-07-18 LAB — MAGNESIUM: Magnesium: 1.9 mg/dL (ref 1.7–2.4)

## 2018-07-18 MED ORDER — LIDOCAINE VISCOUS HCL 2 % MT SOLN
15.0000 mL | Freq: Once | OROMUCOSAL | Status: AC
  Administered 2018-07-18: 16:00:00 15 mL via ORAL
  Filled 2018-07-18: qty 15

## 2018-07-18 MED ORDER — APIXABAN 5 MG PO TABS
5.0000 mg | ORAL_TABLET | Freq: Two times a day (BID) | ORAL | Status: DC
  Administered 2018-07-18 – 2018-07-21 (×6): 5 mg via ORAL
  Filled 2018-07-18 (×7): qty 1

## 2018-07-18 MED ORDER — ALUM & MAG HYDROXIDE-SIMETH 200-200-20 MG/5ML PO SUSP
30.0000 mL | Freq: Once | ORAL | Status: AC
  Administered 2018-07-18: 16:00:00 30 mL via ORAL
  Filled 2018-07-18: qty 30

## 2018-07-18 NOTE — Progress Notes (Signed)
PROGRESS NOTE  YOSGAR DEMIRJIAN VOZ:[id number] DOB: 07/10/1945 DOA: 07/12/2018 PCP: Garwin Brothers, MD  HPI/Recap of past 13 hours: 73 year old male with history of coronary artery disease status post CABG x2 in 1984 and 1996, subsequent MI with multiple PCI's, peripheral vascular disease status post CEA in 1906 and renal artery stenting in 2005, paroxysmal A. fib on Eliquis, GERD and recent right hip fracture status post repair at [facility] and subsequent discharged on 07/07/2018 was transferred from [facility] for fever, cough and shortness of breath.  COVID-19 testing was negative.  Troponin was 2.70.  CT angiogram of the chest showed no PE but areas of mild mucus impaction in the right lower lobe along with severe stenosis of the SMA origin.  Cardiology was consulted and patient was transferred to [facility] and started on heparin drip.  He was also started on broad-spectrum antibiotics.  He was also found to have severe SMA stenosis.  Vascular surgery was consulted.  07/18/18: Patient seen and examined at bedside.  Reports his shortness of breath is improved.  Seen 1 unit PRBC yesterday.  Hemoglobin stable 7.9 post blood transfusion from 6.6 yesterday.  Assessment/Plan: Principal Problem:   NSTEMI (non-ST elevated myocardial infarction) [facility]) Active Problems:   Essential hypertension   Hx of CABG '84, '96, last PCI 2012   GASTROESOPHAGEAL REFLUX DISEASE   PVD (peripheral vascular disease) ([place])   Carotid disease, bilateral ([place])   Hyperlipidemia LDL goal <70   Paroxysmal atrial fibrillation (HCC)   Coronary artery disease   Fever   Lobar pneumonia (HCC)   Mesenteric ischemia (HCC)  NSTEMI Troponin peaked at 5.16 and trended down Denies chest pain this morning Cardiology followed and signed off on 07/17/2018 Continue to closely monitor on telemetry  Status post SMA celiac stent Follow-up mesenteric duplex 1 month Continue aspirin Resume Eliquis  CKD 3  Baseline  creatinine appears to be 1.2 with GFR 57 Creatinine 1.36 Avoid nephrotoxic agents Monitor urine output Repeat BMP in the morning  Mild hypovolemic hyponatremia Sodium 132 Asymptomatic Continue to closely monitor  Chronic normocytic anemia Hemoglobin dropped yesterday from 7.1-6.6 Post 1 unit PRBC Hemoglobin stable this morning at 7.9 Restart Eliquis Repeat CBC in the morning  Paroxysmal A. fib Rate controlled Continue cardiac medications Resume Eliquis  Post recent right hip surgery Pain management in place as needed Physical therapy recommend home health PT Follow-up with your orthopedic surgeon within 4 weeks with no x-rays  DVT prophylaxis: Heparin drip Code Status: Full Family Communication: None at bedside Disposition Plan: Home possibly tomorrow 07/19/2018  Consultants: Cardiology/vascular surgery/ orthopedic surgery  Procedures: Angiogram and superior mesenteric artery and celiac artery stenting  Antimicrobials: cefepime from 07/12/2018 onwards Vancomycin 07/12/2018-07/14/2018    Objective: Vitals:   07/17/18 1945 07/17/18 2042 07/18/18 0533 07/18/18 0828  BP: 118/88 115/60 108/62 (!) 114/58  Pulse: 83 78 86 80  Resp: 18 (!) 21 13 19   Temp: 98.1 F (36.7 C) 98.1 F (36.7 C) 98.2 F (36.8 C) 98.2 F (36.8 C)  TempSrc: Oral Oral Oral Oral  SpO2: 99% 100% 99% 98%  Weight:   61.4 kg   Height:        Intake/Output Summary (Last 24 hours) at 07/18/2018 1421 Last data filed at 07/18/2018 0900 Gross per 24 hour  Intake 744 ml  Output 525 ml  Net 219 ml   Filed Weights   07/16/18 0443 07/17/18 0500 07/18/18 0533  Weight: 62.6 kg 60.5 kg 61.4 kg  Exam:  . General: 73 y.o. year-old male well developed well nourished in no acute distress.  Alert and oriented x3. . Cardiovascular: Regular rate and rhythm with no rubs or gallops.  No thyromegaly or JVD noted.   Marland Kitchen Respiratory: Clear to auscultation with no wheezes or rales. Good inspiratory effort. .  Abdomen: Soft nontender nondistended with normal bowel sounds x4 quadrants. . Musculoskeletal: 1+ pitting edema in lower extremities bilaterally. . Skin: Bruising noted right hip post surgery . Psychiatry: Mood is appropriate for condition and setting   Data Reviewed: CBC: Recent Labs  Lab 07/12/18 2336  07/14/18 0337 07/15/18 0519 07/16/18 0207 07/17/18 0326 07/18/18 0326  WBC 19.4*   < > 13.4* 10.8* 7.7 8.0 9.0  NEUTROABS 17.3*  --  11.6*  --   --   --  7.0  HGB 7.9*   < > 7.5* 7.1* 7.1* 6.6* 7.9*  HCT 23.1*   < > 21.3* 20.7* 20.9* 19.6* 22.7*  MCV 96.3   < > 94.7 95.0 94.6 96.1 92.3  PLT 222   < > 208 206 224 187 178   < > = values in this interval not displayed.   Basic Metabolic Panel: Recent Labs  Lab 07/14/18 0337 07/15/18 0519 07/16/18 0207 07/17/18 0326 07/18/18 0326  NA 130* 131* 133* 133* 132*  K 3.1* 3.8 4.0 4.4 4.3  CL 99 100 99 105 99  CO2 20* 20* 21* 21* 23  GLUCOSE 102* 124* 124* 111* 133*  BUN 30* 32* 36* 29* 27*  CREATININE 1.24 1.31* 1.37* 1.38* 1.36*  CALCIUM 8.0* 8.1* 8.5* 8.4* 8.6*  MG 1.8 1.7  --  1.9 1.9   GFR: Estimated Creatinine Clearance: 41.4 mL/min (A) (by C-G formula based on SCr of 1.36 mg/dL (H)). Liver Function Tests: Recent Labs  Lab 07/12/18 2336 07/13/18 0354 07/14/18 0337 07/17/18 0326  AST 31 44* 46* 21  ALT 14 17 23 20   ALKPHOS 56 60 52 60  BILITOT 0.9 0.8 0.9 0.7  PROT 5.4* 5.7* 5.2* 5.2*  ALBUMIN 2.7* 2.8* 2.4* 2.5*   No results for input(s): LIPASE, AMYLASE in the last 168 hours. No results for input(s): AMMONIA in the last 168 hours. Coagulation Profile: No results for input(s): INR, PROTIME in the last 168 hours. Cardiac Enzymes: Recent Labs  Lab 07/13/18 0733 07/13/18 1338 07/13/18 2005  TROPONINI 5.16* 4.90* 4.00*   BNP (last 3 results) No results for input(s): PROBNP in the last 8760 hours. HbA1C: No results for input(s): HGBA1C in the last 72 hours. CBG: No results for input(s): GLUCAP in the  last 168 hours. Lipid Profile: No results for input(s): CHOL, HDL, LDLCALC, TRIG, CHOLHDL, LDLDIRECT in the last 72 hours. Thyroid Function Tests: No results for input(s): TSH, T4TOTAL, FREET4, T3FREE, THYROIDAB in the last 72 hours. Anemia Panel: No results for input(s): VITAMINB12, FOLATE, FERRITIN, TIBC, IRON, RETICCTPCT in the last 72 hours. Urine analysis:    Component Value Date/Time   COLORURINE DARK YELLOW 11/25/2014 1027   APPEARANCEUR CLEAR 11/25/2014 1027   LABSPEC 1.023 11/25/2014 1027   PHURINE 5.5 11/25/2014 1027   GLUCOSEU NEGATIVE 11/25/2014 1027   HGBUR NEGATIVE 11/25/2014 1027   BILIRUBINUR NEGATIVE 11/25/2014 1027   KETONESUR NEGATIVE 11/25/2014 1027   PROTEINUR NEGATIVE 11/25/2014 1027   NITRITE NEGATIVE 11/25/2014 1027   LEUKOCYTESUR NEGATIVE 11/25/2014 1027   Sepsis Labs: @LABRCNTIP (procalcitonin:4,lacticidven:4)  ) Recent Results (from the past 240 hour(s))  Culture, blood (Routine X 2) w Reflex to ID Panel  Status: None   Collection Time: 07/12/18 11:36 PM  Result Value Ref Range Status   Specimen Description BLOOD RIGHT ARM  Final   Special Requests   Final    BOTTLES DRAWN AEROBIC ONLY Blood Culture adequate volume   Culture   Final    NO GROWTH 5 DAYS Performed at [facility], [address], [place], [place] [address]    Report Status 07/17/2018 FINAL  Final  Culture, blood (Routine X 2) w Reflex to ID Panel     Status: None   Collection Time: 07/12/18 11:38 PM  Result Value Ref Range Status   Specimen Description BLOOD RIGHT HAND  Final   Special Requests   Final    BOTTLES DRAWN AEROBIC ONLY Blood Culture adequate volume   Culture   Final    NO GROWTH 5 DAYS Performed at [facility], [place] [address], [place], [place] [address]    Report Status 07/17/2018 FINAL  Final  SARS Coronavirus 2 (CEPHEID- Performed in [place] hospital lab), Hosp Order     Status: None   Collection Time: 07/13/18  9:42 AM  Result Value Ref  Range Status   SARS Coronavirus 2 NEGATIVE NEGATIVE Final    Comment: (NOTE) If result is NEGATIVE SARS-CoV-2 target nucleic acids are NOT DETECTED. The SARS-CoV-2 RNA is generally detectable in upper and lower  respiratory specimens during the acute phase of infection. The lowest  concentration of SARS-CoV-2 viral copies this assay can detect is 250  copies / mL. A negative result does not preclude SARS-CoV-2 infection  and should not be used as the sole basis for treatment or other  patient management decisions.  A negative result may occur with  improper specimen collection / handling, submission of specimen other  than nasopharyngeal swab, presence of viral mutation(s) within the  areas targeted by this assay, and inadequate number of viral copies  (<250 copies / mL). A negative result must be combined with clinical  observations, patient history, and epidemiological information. If result is POSITIVE SARS-CoV-2 target nucleic acids are DETECTED. The SARS-CoV-2 RNA is generally detectable in upper and lower  respiratory specimens dur ing the acute phase of infection.  Positive  results are indicative of active infection with SARS-CoV-2.  Clinical  correlation with patient history and other diagnostic information is  necessary to determine patient infection status.  Positive results do  not rule out bacterial infection or co-infection with other viruses. If result is PRESUMPTIVE POSTIVE SARS-CoV-2 nucleic acids MAY BE PRESENT.   A presumptive positive result was obtained on the submitted specimen  and confirmed on repeat testing.  While 2019 novel coronavirus  (SARS-CoV-2) nucleic acids may be present in the submitted sample  additional confirmatory testing may be necessary for epidemiological  and / or clinical management purposes  to differentiate between  SARS-CoV-2 and other Sarbecovirus currently known to infect humans.  If clinically indicated additional testing with an  alternate test  methodology [id number]) is advised. The SARS-CoV-2 RNA is generally  detectable in upper and lower respiratory sp ecimens during the acute  phase of infection. The expected result is Negative. Fact Sheet for Patients:  StrictlyIdeas.no Fact Sheet for Healthcare Providers: BankingDealers.co.za This test is not yet approved or cleared by the [place] FDA and has been authorized for detection and/or diagnosis of SARS-CoV-2 by FDA under an Emergency Use Authorization (EUA).  This EUA will remain in effect (meaning this test can be used) for the duration  of the COVID-19 declaration under Section 564(b)(1) of the Act, 21 U.S.C. section 360bbb-3(b)(1), unless the authorization is terminated or revoked sooner. Performed at [facility], [place] [address] [place], [place] [address]       Studies: No results found.  Scheduled Meds: . sodium chloride   Intravenous Once  . amoxicillin-clavulanate  1 tablet Oral Q12H  . aspirin EC  81 mg Oral Daily  . diltiazem  30 mg Oral Q6H  . dutasteride  0.5 mg Oral Daily  . ezetimibe  10 mg Oral Daily  . methocarbamol  1,500 mg Oral QID  . metoprolol tartrate  100 mg Oral BID  . pantoprazole  40 mg Oral Daily  . polyethylene glycol  17 g Oral BID  . pravastatin  40 mg Oral q1800  . ranolazine  500 mg Oral BID  . senna-docusate  1 tablet Oral BID  . sodium chloride flush  3 mL Intravenous Q12H    Continuous Infusions: . sodium chloride       LOS: 6 days     Kayleen Memos, MD [facility] Pager [phone]  If 7PM-7AM, please contact night-coverage [email or web address] Password [email or web address] 07/18/2018, 2:21 PM

## 2018-07-18 NOTE — Progress Notes (Addendum)
[facility]  Subjective  - feels ok   Objective (!) 114/58 80 98.2 F (36.8 C) (Oral) 19 98%  Intake/Output Summary (Last 24 hours) at 07/18/2018 1247 Last data filed at 07/18/2018 0900 Gross per 24 hour  Intake 744 ml  Output 525 ml  Net 219 ml   Abdomen soft non tender Left groin no hematoma  Assessment/Planning: S/p SMA celiac stent  Encouraged pt to eat more.  He does not have abdominal pain.  No dietary restrictions from my standpoint  Will sign off Follow up mesenteric duplex 1 month  Needs asa and eliquis   Ruta Hinds 07/18/2018 12:47 PM --  Laboratory Lab Results: Recent Labs    07/17/18 0326 07/18/18 0326  WBC 8.0 9.0  HGB 6.6* 7.9*  HCT 19.6* 22.7*  PLT 187 178   BMET Recent Labs    07/17/18 0326 07/18/18 0326  NA 133* 132*  K 4.4 4.3  CL 105 99  CO2 21* 23  GLUCOSE 111* 133*  BUN 29* 27*  CREATININE 1.38* 1.36*  CALCIUM 8.4* 8.6*    COAG Lab Results  Component Value Date   INR 1.08 01/24/2011   No results found for: PTT

## 2018-07-18 NOTE — Progress Notes (Signed)
Physical Therapy Treatment Patient Details Name: [name] MRN: [id number] DOB: [birth year] Today's Date: 07/18/2018    History of Present Illness 73 year old male with history of coronary artery disease status post CABG x2 in 1984 and 1996, subsequent MI with multiple PCI's, peripheral vascular disease status post CEA in 1906 and renal artery stenting in 2005, paroxysmal A. fib on Eliquis, GERD and recent right hip fracture status post repair at [facility] and subsequent discharged on 07/07/2018 was transferred from [facility] for fever, cough and shortness of breath.  COVID-19 testing was negative.  Troponin was 2.70.  CT angiogram of the chest showed no PE but areas of mild mucus impaction in the right lower lobe along with severe stenosis of the SMA origin.  Cardiology was consulted and patient was transferred to [facility] and started on heparin drip.  He was also started on broad-spectrum antibiotics.  He was also found to have severe SMA stenosis.  Vascular surgery was consulted.  Status post angiography and celiac artery stenting and superior mesenteric artery stenting on 07/16/2018.    PT Comments    Pt nauseous with reflux earlier, but now willing to try.  Emphasis on gait stability and stamina, with transfer safety.   Follow Up Recommendations  Home health PT     Equipment Recommendations  None recommended by PT    Recommendations for Other Services       Precautions / Restrictions Precautions Precautions: Fall Restrictions Weight Bearing Restrictions: Yes RLE Weight Bearing: Weight bearing as tolerated    Mobility  Bed Mobility               General bed mobility comments: up on BSC on arrival  Transfers Overall transfer level: Needs assistance Equipment used: Rolling walker (2 wheeled) Transfers: Sit to/from Stand Sit to Stand: Min guard         General transfer comment: safe transfer technique  Ambulation/Gait Ambulation/Gait  assistance: Min guard Gait Distance (Feet): 160 Feet Assistive device: Rolling walker (2 wheeled) Gait Pattern/deviations: Step-to pattern;Step-through pattern     General Gait Details: steady, so assist needed, flexed/scoliotic posture, slower cadence.   Stairs             Wheelchair Mobility    Modified Rankin (Stroke Patients Only)       Balance                                            Cognition Arousal/Alertness: Awake/alert Behavior During Therapy: WFL for tasks assessed/performed Overall Cognitive Status: Within Functional Limits for tasks assessed                                        Exercises      General Comments        Pertinent Vitals/Pain Pain Assessment: Faces Faces Pain Scale: Hurts a little bit Pain Location: right hip Pain Descriptors / Indicators: Aching;Sore    Home Living                      Prior Function            PT Goals (current goals can now be found in the care plan section) Acute Rehab PT Goals Patient Stated Goal: back fully independent PT Goal  Formulation: With patient Time For Goal Achievement: 07/31/18 Potential to Achieve Goals: Good Progress towards PT goals: Progressing toward goals    Frequency    Min 3X/week      PT Plan Current plan remains appropriate    Co-evaluation              AM-PAC PT "6 Clicks" Mobility   Outcome Measure  Help needed turning from your back to your side while in a flat bed without using bedrails?: A Little Help needed moving from lying on your back to sitting on the side of a flat bed without using bedrails?: A Little Help needed moving to and from a bed to a chair (including a wheelchair)?: A Little Help needed standing up from a chair using your arms (e.g., wheelchair or bedside chair)?: A Little Help needed to walk in hospital room?: A Little Help needed climbing 3-5 steps with a railing? : A Little 6 Click Score:  18    End of Session   Activity Tolerance: Patient tolerated treatment well Patient left: in chair;with call bell/phone within reach Nurse Communication: Mobility status PT Visit Diagnosis: Other abnormalities of gait and mobility (R26.89);Difficulty in walking, not elsewhere classified (R26.2)     Time: 6767-2094 PT Time Calculation (min) (ACUTE ONLY): 17 min  Charges:  $Gait Training: 8-22 mins                     07/18/2018  Donnella Sham, PT Acute Rehabilitation Services [phone]  (pager) [phone]  (office)   Tessie Fass Bentleigh Stankus 07/18/2018, 5:17 PM

## 2018-07-18 NOTE — TOC Initial Note (Signed)
Transition of Care [facility]) - Initial/Assessment Note    Patient Details  Name: [name] MRN: [id number] Date of Birth: 1945/12/04  Transition of Care [facility]) CM/SW Contact:    Sherrilyn Rist Phone Number: [phone] 07/18/2018, 2:16 PM  Clinical Narrative:                 Patient lives at home with spouse; PCP: Garwin Brothers, MD; has private insurance with [facility]; patient states that he is active with [facility] for [facility] services as prior to admission. Cory with [facility] called.  Expected Discharge Plan: [place] Barriers to Discharge: No Barriers Identified   Patient Goals and CMS Choice Patient states their goals for this hospitalization and ongoing recovery are:: to get stronger CMS Medicare.gov Compare Post Acute Care list provided to:: Patient Choice offered to / list presented to : Patient  Expected Discharge Plan and Services Expected Discharge Plan: [place]   Discharge Planning Services: CM Consult Post Acute Care Choice: Home Health, Resumption of Svcs/PTA Provider Living arrangements for the past 2 months: Single Family Home                           HH Arranged: PT [place]: [place] Date [place]: 07/18/18 Time HH Agency Contacted: 6 Representative spoke with at [place]: Georgina Snell  Prior Living Arrangements/Services Living arrangements for the past 2 months: [place] Lives with:: Spouse Patient language and need for interpreter reviewed:: Yes Do you feel safe going back to the place where you live?: Yes      Need for Family Participation in Patient Care: Yes (Comment) Care giver support system in place?: Yes (comment)   Criminal Activity/Legal Involvement Pertinent to Current Situation/Hospitalization: No - Comment as needed  Activities of Daily Living Home Assistive Devices/Equipment: Walker (specify type) ADL Screening (condition at time of  admission) Patient's cognitive ability adequate to safely complete daily activities?: Yes Is the patient deaf or have difficulty hearing?: No Does the patient have difficulty seeing, even when wearing glasses/contacts?: No Does the patient have difficulty concentrating, remembering, or making decisions?: No Patient able to express need for assistance with ADLs?: Yes Does the patient have difficulty dressing or bathing?: No Independently performs ADLs?: Yes (appropriate for developmental age) Does the patient have difficulty walking or climbing stairs?: No Weakness of Legs: Right Weakness of Arms/Hands: None  Permission Sought/Granted Permission sought to share information with : Case Manager Permission granted to share information with : Yes, Verbal Permission Granted  Share Information with NAME: spouse  Permission granted to share info w AGENCY: [place] [facility]        Emotional Assessment Appearance:: Developmentally appropriate Attitude/Demeanor/Rapport: Gracious Affect (typically observed): Accepting Orientation: : Oriented to  Time, Oriented to Self, Oriented to Place, Oriented to Situation Alcohol / Substance Use: Not Applicable Psych Involvement: No (comment)  Admission diagnosis:  NON ST ELEVATED MI Patient Active Problem List   Diagnosis Date Noted  . Mesenteric ischemia ([place])   . NSTEMI (non-ST elevated myocardial infarction) ([place]) 07/12/2018  . Fever 07/12/2018  . Lobar pneumonia ([place]) 07/12/2018  . Diastolic CHF ([place]) 00/93/8182  . Chronic anticoagulation 04/19/2017  . Subclavian artery stenosis, left ([place])   . Dyspnea   . S/P CABG (coronary artery bypass graft)   . Renal artery stenosis ([place])   . RBBB   . Pyloric stenosis   . Paroxysmal  atrial fibrillation ([place])   . Hyperlipidemia   . Hiatal hernia   . GERD (gastroesophageal reflux disease)   . Esophageal stricture   . Esophageal dilatation   . Dysrhythmia   . Complication of anesthesia   . Colon polyps   .  Chronic back pain   . Bronchitis   . Atrial fibrillation ([place])   . Palpitations 06/16/2016  . PAF (paroxysmal atrial fibrillation) ([place])   . Change in bowel habits 02/03/2014  . CN (constipation) 02/03/2014  . Rectal bleeding 02/03/2014  . Hyperlipidemia LDL goal <70 01/16/2014  . Carotid stenosis 12/10/2013  . Aftercare following surgery of the circulatory system 12/10/2013  . Carotid disease, bilateral ([place]) 03/06/2013  . Anemia 01/26/2011  . PVD (peripheral vascular disease) ([place]) 01/26/2011  . 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 Crescendo angina ([place]) 01/25/2011  . Presence of stent of bypass graft 01/25/2011  . Coronary artery disease 01/24/2011  . CONSTIPATION 01/25/2010  . NAUSEA 08/06/2009  . ABDOMINAL PAIN, GENERALIZED 08/06/2009  . ABDOMINAL PAIN-MULTIPLE SITES 08/06/2009  . INGUINAL HERNIA 03/12/2009  . FLATULENCE-GAS-BLOATING 03/12/2009  . ESOPHAGEAL STRICTURE 01/30/2008  . HYPERLIPIDEMIA 04/24/2007  . Essential hypertension 04/24/2007  . Hx of CABG '84, '96, last PCI 2012 04/24/2007  . HEMORRHOIDS 04/24/2007  . EROSIVE ESOPHAGITIS 04/24/2007  . GASTROESOPHAGEAL REFLUX DISEASE 04/24/2007  . PEPTIC STRICTURE 04/24/2007  . Myocardial infarction ([place]) 03/07/1989   PCP:  Garwin Brothers, MD Pharmacy:   [id number] - [place], [address] [place] [address] Phone: [phone] Fax: [phone]     Social Determinants of Health (SDOH) Interventions    Readmission Risk Interventions No flowsheet data found.

## 2018-07-18 NOTE — Discharge Instructions (Addendum)
Acute Coronary Syndrome  Acute coronary syndrome (ACS) is a serious problem in which there is suddenly not enough blood and oxygen reaching the heart. ACS can result in chest pain or a heart attack. This condition is a medical emergency. If you have any symptoms of this condition, get help right away. What are the causes? This condition may be caused by:  Buildup of fat and cholesterol inside of the arteries (atherosclerosis). This is the most common cause. The buildup (plaque) can cause blood vessels in the heart (coronary arteries) to become narrow or blocked, which reduces blood flow to the heart. Plaque can also break off and lead to a clot, which can block an artery and cause a heart attack or stroke.  Sudden tightening of the muscles around the coronary arteries (coronary spasm).  Tearing of a coronary artery (spontaneous coronary artery dissection).  Very low blood pressure (hypotension).  An abnormal heartbeat (arrhythmia).  Other medical conditions that cause a decrease of oxygen to the heart, such as anemiaorrespiratory failure.  Using cocaine or methamphetamine. What increases the risk? The following factors may make you more likely to develop this condition:  Age. The risk for ACS increases as you get older.  History of chest pain, heart attack, peripheral artery disease, or stroke.  Having taken chemotherapy or immune-suppressing medicines.  Being male.  Family history of chest pain, heart disease, or stroke.  Smoking.  Not exercising enough.  Being overweight.  High cholesterol.  High blood pressure (hypertension).  Diabetes.  Excessive alcohol use. What are the signs or symptoms? Common symptoms of this condition include:  Chest pain. The pain may last a long time, or it may stop and come back (recur). It may feel like: ? Crushing or squeezing. ? Tightness, pressure, fullness, or heaviness.  Arm, neck, jaw, or back pain.  Heartburn or  indigestion.  Shortness of breath.  Nausea.  Sudden cold sweats.  Light-headedness.  Dizziness, or passing out.  Tiredness (fatigue). Sometimes there are no symptoms. How is this diagnosed? This condition may be diagnosed based on:  Your medical history and symptoms.  An electrocardiogram (ECG). This imaging test measures the heart's electrical activity.  Blood tests. Cardiac blood tests may need to be repeated at designated time intervals.  Chest X-ray.  A CT scan of the chest.  A coronary angiogram. This is a procedure in which dye is injected into the bloodstream and then X-rays are taken to show if there is a blockage in a coronary artery.  Exercise stress testing.  Echocardiography. This is a test that uses sound waves to produce detailed images of the heart. How is this treated? The treatment is to restore blood flow to the heart as soon as possible. Treatment for this condition may include:  Oxygen therapy.  Medicines, such as: ? Antiplatelet medicines and blood-thinning medicines, such as aspirin. These help prevent blood clots. ? Medicine that dissolves any blood clots (fibrinolytic therapy). ? Blood pressure medicines. ? Nitroglycerin. This helps relieve chest pain and widens blood vessels to improve blood flow. ? Pain medicine. ? Cholesterol-lowering medicine.  Surgery, such as: ? Coronary angioplasty with stent placement. This involves placing a small piece of metal that looks like mesh or a spring into a narrow coronary artery. This widens the artery and keep it open. ? Coronary artery bypass surgery. This involves taking a section of a blood vessel from a different part of your body, and placing it on the blocked coronary artery to allow  blood to flow around (bypass) the blockage.  Cardiac rehabilitation. This is a program that helps improve your health and well-being. It includes exercise training, education, and counseling to help you recover. Follow  these instructions at home: Eating and drinking  Eat a heart-healthy diet that includes whole grains, fruits and vegetables, lean proteins, and low-fat or nonfat dairy products.  Limit how much salt (sodium) you eat as told by your health care provider. Follow instructions from your health care provider about any other eating or drinking restrictions, such as limiting foods that are high in fat and processed sugars.  Use healthy cooking methods such as roasting, grilling, broiling, baking, poaching, steaming, or stir-frying.  Talk with a dietitian to learn about healthy cooking methods and how to eat less sodium. Medicines  Take over-the-counter and prescription medicines only as told by your health care provider.  Do not take these medicines unless your health care provider approves: ? Vitamin supplements that contain vitamin A or vitamin E. ? Nonsteroidal anti-inflammatory drugs (NSAIDs), such as ibuprofen, naproxen, or celecoxib. ? Hormone replacement therapy that contains estrogen. If you are taking blood thinners:  Talk with your health care provider before you take any medicines that contain aspirin or NSAIDs. These medicines increase your risk for dangerous bleeding.  Take your medicine exactly as told, at the same time every day.  Avoid activities that could cause injury or bruising, and follow instructions about how to prevent falls.  Wear a medical alert bracelet, and carry a card that lists what medicines you take. Activity  Join a cardiac rehabilitation program. An exercise plan will be developed for you.  Ask your health care provider: ? What activities and exercises are safe for you. ? If you should follow specific instructions about lifting, driving, or climbing stairs. Lifestyle  Do not use any products that contain nicotine or tobacco, such as cigarettes and e-cigarettes. If you need help quitting, ask your health care provider.  If your health care provider  says that alcohol is safe for you, limit your alcohol intake to no more than 1 drink a day. One drink equals 12 oz of beer, 5 oz of wine, or 1 oz of hard liquor.  Maintain a healthy weight. If you need to lose weight, work with your health care provider to do so safely. General instructions  Tell all the health care providers who care for you about your heart condition, including your dentist. This may affect the medicines or treatment you receive.  Manage any other health conditions you have, such as hypertension or diabetes. These conditions affect your heart.  Learn ways to manage stress.  Get screened for depression, and get mental health treatment if you need it. People with ACS are at higher risk for depression.  Keep your vaccinations up to date. Get the flu shot (influenza vaccine) every year.  If directed, monitor your blood pressure at home.  Keep all follow-up visits as told by your health care provider. This is important. Contact a health care provider if:  You feel overwhelmed or sad.  You have trouble doing your daily activities. Get help right away if:  You have pain in your chest, neck, arm, jaw, stomach, or back that recurs, and: ? It lasts for more than a few minutes. ? It is not relieved by taking the [facility] health care provider prescribed.  You have unexplained: ? Heavy sweating. ? Heartburn or indigestion. ? Nausea or vomiting. ? Shortness of breath. ? Difficulty breathing. ?  Fatigue. ? Nervousness or anxiety. ? Weakness. ? Diarrhea. ? Dark stools or blood in your stool.  You have sudden light-headedness or dizziness.  Your blood pressure is higher than 180/120.  You faint.  You have thoughts about hurting yourself. These symptoms may represent a serious problem that is an emergency. Do not wait to see if the symptoms will go away. Get medical help right away. Call your local emergency services (911 in the [place] Do not drive yourself to the  hospital. If you ever feel like you may hurt yourself or others, or have thoughts about taking your own life, get help right away. You can go to your nearest emergency department or call:  Emergency services (911 in the [place]  A suicide crisis helpline, such as the [facility] at 912-867-0752. This is open 24 hours a day. Summary  Acute coronary syndrome (ACS) is when there is not enough blood and oxygen being supplied to the heart. ACS can result in chest pain or a heart attack.  Acute coronary syndrome is a medical emergency. If you have any symptoms of this condition, get help right away.  Treatment includes medicines and procedures to open the blocked arteries and restore blood flow. This information is not intended to replace advice given to you by your health care provider. Make sure you discuss any questions you have with your health care provider. Document Released: 02/21/2005 Document Revised: 11/01/2016 Document Reviewed: 11/01/2016 Elsevier Interactive Patient Education  2019 Elsevier Inc.   Aspirin and Your Heart  Aspirin is a medicine that prevents the cells in the blood that are used for clotting, called platelets, from sticking together. Aspirin can be used to help reduce the risk of blood clots, heart attacks, and other heart-related problems. Can I take aspirin? Your health care provider will help you determine whether it is safe and beneficial for you to take aspirin daily. Taking aspirin daily may be helpful if you:  Have had a heart attack or chest pain.  Are at risk for a heart attack.  Have undergone open-heart surgery, such as coronary artery bypass surgery (CABG).  Have had coronary angioplasty or a stent.  Have had certain types of stroke or transient ischemic attack (TIA).  Have peripheral artery disease (PAD).  Have chronic heart rhythm problems such as atrial fibrillation and cannot take an anticoagulant.  Have valve  disease or have had surgery on a valve. What are the risks? Daily use of aspirin can cause side effects. Some of these include:  Bleeding. Bleeding problems can be minor or serious. An example of a minor problem is a cut that does not stop bleeding. An example of a more serious problem is stomach bleeding or, rarely, bleeding into the brain. Your risk of bleeding is increased if you are also taking non-steroidal anti-inflammatory drugs (NSAIDs).  Increased bruising.  Upset stomach.  An allergic reaction. People who have nasal polyps have an increased risk of developing an aspirin allergy. General guidelines  Take aspirin only as told by your health care provider. Make sure that you understand how much you should take and what form you should take. The two forms of aspirin are: ? Non-enteric-coated.This type of aspirin does not have a coating and is absorbed quickly. This type of aspirin also comes in a chewable form. ? Enteric-coated. This type of aspirin has a coating that releases the medicine very slowly. Enteric-coated aspirin might cause less stomach upset than non-enteric-coated aspirin. This type of aspirin  should not be chewed or crushed.  Limit alcohol intake to no more than 1 drink a day for nonpregnant women and 2 drinks a day for men. Drinking alcohol increases your risk of bleeding. One drink equals 12 oz of beer, 5 oz of wine, or 1 oz of hard liquor. Contact a health care provider if you:  Have unusual bleeding or bruising.  Have stomach pain or nausea.  Have ringing in your ears.  Have an allergic reaction that causes: ? Hives. ? Itchy skin. ? Swelling of the lips, tongue, or face. Get help right away if you:  Notice that your bowel movements are bloody, dark red, or black in color.  Vomit or cough up blood.  Have blood in your urine.  Cough, have noisy breathing (wheeze), or feel short of breath.  Have chest pain, especially if the pain spreads to the arms,  back, neck, or jaw.  Have a severe headache, or a headache with confusion, or dizziness. These symptoms may represent a serious problem that is an emergency. Do not wait to see if the symptoms will go away. Get medical help right away. Call your local emergency services (911 in the [place] Do not drive yourself to the hospital. Summary  Aspirin can be used to help reduce the risk of blood clots, heart attacks, and other heart-related problems.  Daily use of aspirin can increase your risk of side effects. Your health care provider will help you determine whether it is safe and beneficial for you to take aspirin daily.  Take aspirin only as told by your health care provider. Make sure that you understand how much you can take and what form you can take. This information is not intended to replace advice given to you by your health care provider. Make sure you discuss any questions you have with your health care provider. Document Released: 02/04/2008 Document Revised: 12/22/2016 Document Reviewed: 12/22/2016 Elsevier Interactive Patient Education  2019 [place] CARDIOLOGY TEAM HAS ARRANGED FOR AN E-VISIT FOR YOUR APPOINTMENT - PLEASE REVIEW IMPORTANT INFORMATION BELOW SEVERAL DAYS PRIOR TO YOUR APPOINTMENT  Due to the recent COVID-19 pandemic, we are transitioning in-person office visits to tele-medicine visits in an effort to decrease unnecessary exposure to our patients, their families, and staff. These visits are billed to your insurance just like a normal visit is. We also encourage you to sign up for MyChart if you have not already done so. You will need a smartphone if possible. For patients that do not have this, we can still complete the visit using a regular telephone but do prefer a smartphone to enable video when possible. You may have a family member that lives with you that can help. If possible, we also ask that you have a blood pressure cuff and scale at home to measure your  blood pressure, heart rate and weight prior to your scheduled appointment. Patients with clinical needs that need an in-person evaluation and testing will still be able to come to the office if absolutely necessary. If you have any questions, feel free to call our office.  2-3 DAYS BEFORE YOUR APPOINTMENT  You will receive a telephone call from one of our [facility] team members - your caller ID may say "Unknown caller." If this is a video visit, we will walk you through how to get the video launched on your phone. We will remind you check your blood pressure, heart rate and weight prior to your scheduled appointment. If you have an  Apple Watch or Kardia, please upload any pertinent ECG strips the day before or morning of your appointment to [facility]. Our staff will also make sure you have reviewed the consent and agree to move forward with your scheduled tele-health visit.   THE DAY OF YOUR APPOINTMENT  Approximately 15 minutes prior to your scheduled appointment, you will receive a telephone call from one of [facility] team - your caller ID may say "Unknown caller."  Our staff will confirm medications, vital signs for the day and any symptoms you may be experiencing. Please have this information available prior to the time of visit start. It may also be helpful for you to have a pad of paper and pen handy for any instructions given during your visit. They will also walk you through joining the smartphone meeting if this is a video visit.   CONSENT FOR TELE-HEALTH VISIT - PLEASE REVIEW  I hereby voluntarily request, consent and authorize [facility] and its employed or contracted physicians, physician assistants, nurse practitioners or other licensed health care professionals (the Practitioner), to provide me with telemedicine health care services (the Services") as deemed necessary by the treating Practitioner. I acknowledge and consent to receive the Services by the Practitioner via telemedicine. I  understand that the telemedicine visit will involve communicating with the Practitioner through live audiovisual communication technology and the disclosure of certain medical information by electronic transmission. I acknowledge that I have been given the opportunity to request an in-person assessment or other available alternative prior to the telemedicine visit and am voluntarily participating in the telemedicine visit.  I understand that I have the right to withhold or withdraw my consent to the use of telemedicine in the course of my care at any time, without affecting my right to future care or treatment, and that the Practitioner or I may terminate the telemedicine visit at any time. I understand that I have the right to inspect all information obtained and/or recorded in the course of the telemedicine visit and may receive copies of available information for a reasonable fee.  I understand that some of the potential risks of receiving the Services via telemedicine include:   Delay or interruption in medical evaluation due to technological equipment failure or disruption;  Information transmitted may not be sufficient (e.g. poor resolution of images) to allow for appropriate medical decision making by the Practitioner; and/or   In rare instances, security protocols could fail, causing a breach of personal health information.  Furthermore, I acknowledge that it is my responsibility to provide information about my medical history, conditions and care that is complete and accurate to the best of my ability. I acknowledge that Practitioner's advice, recommendations, and/or decision may be based on factors not within their control, such as incomplete or inaccurate data provided by me or distortions of diagnostic images or specimens that may result from electronic transmissions. I understand that the practice of medicine is not an exact science and that Practitioner makes no warranties or guarantees  regarding treatment outcomes. I acknowledge that I will receive a copy of this consent concurrently upon execution via email to the email address I last provided but may also request a printed copy by calling the office of [facility].    I understand that my insurance will be billed for this visit.   I have read or had this consent read to me.  I understand the contents of this consent, which adequately explains the benefits and risks of the Services being provided  via telemedicine.   I have been provided ample opportunity to ask questions regarding this consent and the Services and have had my questions answered to my satisfaction.  I give my informed consent for the services to be provided through the use of telemedicine in my medical care  By participating in this telemedicine visit I agree to the above.    [facility]  Discharge Instructions  Lower Extremity Angiogram; Angioplasty/Stenting  Please refer to the following instructions for your post-procedure care. Your surgeon or physician assistant will discuss any changes with you.  Activity  Avoid lifting more than 8 pounds (1 gallons of milk) for 72 hours (3 days) after your procedure. You may walk as much as you can tolerate. It's OK to drive after 72 hours.  Bathing/Showering  You may shower the day after your procedure. If you have a bandage, you may remove it at 24- 48 hours. Clean your incision site with mild soap and water. Pat the area dry with a clean towel.  Diet  Resume your pre-procedure diet. There are no special food restrictions following this procedure. All patients with peripheral vascular disease should follow a low fat/low cholesterol diet. In order to heal from your surgery, it is CRITICAL to get adequate nutrition. Your body requires vitamins, minerals, and protein. Vegetables are the best source of vitamins and minerals. Vegetables also provide the perfect balance of  protein. Processed food has little nutritional value, so try to avoid this.  Medications  Resume taking all of your medications unless your doctor tells you not to. If your incision is causing pain, you may take over-the-counter pain relievers such as acetaminophen (Tylenol)  Follow Up  Follow up will be arranged at the time of your procedure. You may have an office visit scheduled or may be scheduled for surgery. Ask your surgeon if you have any questions.  Please call us immediately for any of the following conditions: Severe or worsening pain your legs or feet at rest or with walking. Increased pain, redness, drainage at your groin puncture site. Fever of 101 degrees or higher. If you have any mild or slow bleeding from your puncture site: lie down, apply firm constant pressure over the area with a piece of gauze or a clean wash cloth for 30 minutes- no peeking!, call 911 right away if you are still bleeding after 30 minutes, or if the bleeding is heavy and unmanageable.  Reduce your risk factors of vascular disease:   Stop smoking. If you would like help call [facility] at 1-800-QUIT-NOW 431-887-2810) or [facility] at 707-279-0128.  Manage your cholesterol  Maintain a desired weight  Control your diabetes  Keep your blood pressure down   If you have any questions, please call the office at [phone]     Information on my medicine - ELIQUIS (apixaban)  Why was Eliquis prescribed for you? Eliquis was prescribed for you to reduce the risk of a blood clot forming that can cause a stroke if you have a medical condition called atrial fibrillation (a type of irregular heartbeat).  What do You need to know about Eliquis ? Take your Eliquis TWICE DAILY - one tablet in the morning and one tablet in the evening with or without food. If you have difficulty swallowing the tablet whole please discuss with your pharmacist how to take the medication safely.  Take  Eliquis exactly as prescribed by your doctor and DO NOT stop taking Eliquis without talking to the  doctor who prescribed the medication.  Stopping may increase your risk of developing a stroke.  Refill your prescription before you run out.  After discharge, you should have regular check-up appointments with your healthcare provider that is prescribing your Eliquis.  In the future your dose may need to be changed if your kidney function or weight changes by a significant amount or as you get older.  What do you do if you miss a dose? If you miss a dose, take it as soon as you remember on the same day and resume taking twice daily.  Do not take more than one dose of ELIQUIS at the same time to make up a missed dose.  Important Safety Information A possible side effect of Eliquis is bleeding. You should call your healthcare provider right away if you experience any of the following: ? Bleeding from an injury or your nose that does not stop. ? Unusual colored urine (red or dark brown) or unusual colored stools (red or black). ? Unusual bruising for unknown reasons. ? A serious fall or if you hit your head (even if there is no bleeding).  Some medicines may interact with Eliquis and might increase your risk of bleeding or clotting while on Eliquis. To help avoid this, consult your healthcare provider or pharmacist prior to using any new prescription or non-prescription medications, including herbals, vitamins, non-steroidal anti-inflammatory drugs (NSAIDs) and supplements.  This website has more information on Eliquis (apixaban): http://[email or web address].[email or web address]/home

## 2018-07-19 ENCOUNTER — Telehealth: Payer: Self-pay | Admitting: Vascular Surgery

## 2018-07-19 ENCOUNTER — Other Ambulatory Visit (HOSPITAL_COMMUNITY): Payer: Self-pay | Admitting: *Deleted

## 2018-07-19 DIAGNOSIS — I214 Non-ST elevation (NSTEMI) myocardial infarction: Secondary | ICD-10-CM

## 2018-07-19 LAB — BASIC METABOLIC PANEL
Anion gap: 8 (ref 5–15)
BUN: 25 mg/dL — ABNORMAL HIGH (ref 8–23)
CO2: 23 mmol/L (ref 22–32)
Calcium: 8.3 mg/dL — ABNORMAL LOW (ref 8.9–10.3)
Chloride: 99 mmol/L (ref 98–111)
Creatinine, Ser: 1.29 mg/dL — ABNORMAL HIGH (ref 0.61–1.24)
GFR calc Af Amer: >60 mL/min (ref >60)
GFR calc non Af Amer: 54 mL/min — ABNORMAL LOW (ref >60)
Glucose, Bld: 123 mg/dL — ABNORMAL HIGH (ref 70–99)
Potassium: 4.3 mmol/L (ref 3.5–5.1)
Sodium: 130 mmol/L — ABNORMAL LOW (ref 135–145)

## 2018-07-19 LAB — CBC
HCT: 23 % — ABNORMAL LOW (ref 39.0–52.0)
Hemoglobin: 7.9 g/dL — ABNORMAL LOW (ref 13.0–17.0)
MCH: 32 pg (ref 26.0–34.0)
MCHC: 34.3 g/dL (ref 30.0–36.0)
MCV: 93.1 fL (ref 80.0–100.0)
Platelets: 182 10*3/uL (ref 150–400)
RBC: 2.47 MIL/uL — ABNORMAL LOW (ref 4.22–5.81)
RDW: 17 % — ABNORMAL HIGH (ref 11.5–15.5)
WBC: 6.5 10*3/uL (ref 4.0–10.5)
nRBC: 0 % (ref 0.0–0.2)

## 2018-07-19 MED ORDER — ALUM & MAG HYDROXIDE-SIMETH 200-200-20 MG/5ML PO SUSP
30.0000 mL | Freq: Once | ORAL | Status: AC
  Administered 2018-07-19: 06:00:00 30 mL via ORAL
  Filled 2018-07-19: qty 30

## 2018-07-19 MED ORDER — LIDOCAINE VISCOUS HCL 2 % MT SOLN
15.0000 mL | Freq: Once | OROMUCOSAL | Status: AC
  Administered 2018-07-19: 15 mL via ORAL
  Filled 2018-07-19: qty 15

## 2018-07-19 MED ORDER — ASPIRIN 81 MG PO TBEC: 81.0000 mg | DELAYED_RELEASE_TABLET | Freq: Every day | ORAL | 0 refills | Status: DC

## 2018-07-19 MED ORDER — SUCRALFATE 1 GM/10ML PO SUSP
1.0000 g | Freq: Three times a day (TID) | ORAL | Status: DC
  Administered 2018-07-19 – 2018-07-21 (×8): 1 g via ORAL
  Filled 2018-07-19 (×8): qty 10

## 2018-07-19 MED ORDER — METOPROLOL TARTRATE 100 MG PO TABS: 100.0000 mg | ORAL_TABLET | Freq: Two times a day (BID) | ORAL | 0 refills | Status: DC

## 2018-07-19 MED ORDER — SODIUM CHLORIDE 4 MEQ/ML IV SOLN
INTRAVENOUS | Status: DC
  Administered 2018-07-19 – 2018-07-20 (×2): via INTRAVENOUS
  Filled 2018-07-19 (×4): qty 1000

## 2018-07-19 NOTE — Consult Note (Addendum)
[facility] Consult: 8:18 AM 07/20/2018  LOS: 8 days    Referring Provider: Dr Irene Pap.   Primary Care Physician:  Garwin Brothers, MD Primary Gastroenterologist:  Dr Scarlette Shorts    Reason for Consultation: Complaint of chest pain.   HPI: [name] is a 73 y.o. male.  Hx PVD.  CAD, MIs.  S/p CABG 1984, redo CABG 1996.  Multiple PCI's.  AF.  Anemia.  Takes 81 ASA, Eliquis, BID Nexium, previously took Plavix.   GERD, erosive esophagitis, peptic ulcer, peptic stricture, esophageal strictures, previous esophageal dilatations. 02/2010 EGD.  Evaluation abdominal pain.  9 appearing distal esophageal stricture.  Small pyloric channel ulcer. 02/2011 EGD.  For follow-up of gastric ulcer.  Distal esophageal stricture.  Tiny pyloric ulcer, overall improved appearance.  Pyloric stenosis.  Hiatal hernia. 04/2014 Colonoscopy.  Adenoma surveillance, adenomas found 2011.  3 polyps (Tubular adenoms without HGD) removed.  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  Patient was okay to resume Plavix and recommended iron sulfate 325 mg daily to reduce risk of anemia.  Plan repeat colonoscopy 04/2019.  ORIF right hip fracture at [facility] 2 weeks ago, discharged 07/07/2018.  Transferred from [facility] 5/14 with abdominal pain worsened after PO, fever 103 F, cough, dyspnea, COVID 19 negative, elevated D dimer and CRP.  .  Ruled in for N STEMI.  Started heparin drip, broad-spectrum antibiotics.  Severe SMA stenosis, Dr. Oneida Alar placed stent to SMA and celiac.  LFTs with AST to 46 >> 21, normal o/w.   Anemia.  Hgb 7.9 at arrival >> 6.6 >> PRBC >>  7.9.  Hgb apparently was 11.2 ~ 2 weeks ago.  5/8 CT angio ab/pelvis.  Stenosis at celiax axis, SMA, IMA a/w increased risk mesenteric ischemia.  Dissection flap in  distal R ext iliac.  Patent R renal artery stent but some instent stenosis.  L renal artery stenosis.  R int iliac stenosis. Sigmoid wall thickening c/w colitis, likely ischemic.  Bil pleural effusions, atx, consolidation.  Small abdominal ascites.  Urinary bladder wall thick, c/w bladder outlet obstruction.  Eliquis restarted 5/13.  PTA the patient was having periodic postprandial dysphagia but it was not bad enough that he contacted Dr. Henrene Pastor for EGD.  His appetite was also did creased.  Since hospitalization he has developed postprandial esophageal burning.  When the burning is severe enough he develops regurgitation/dry heaves but not nausea.   Carafate added to inpt Protonix 40 mg q day. Takes Nexium BID at home.    Some reief of chest burning with mylanta and viscous lidocaine.   At home does not drink.  Does not use NSAIDs or aspirin other than the 81 mg daily.   Past Medical History:  Diagnosis Date  . Anemia   . Angina   . Atrial fibrillation ([place])   . Blood transfusion   . Bronchitis   . Bulging discs    "8 of them; thoracic, lumbar, sacral area"  . Chronic back pain   . Colon polyps   . Complication of  anesthesia   . Coronary artery disease 01/24/11   Successful PCI long segmental stensois  vein graft to the CX marginal vessel-graft previously stented prox. 3.0x18mm TAXUS stent mid seg 3.0x32mm TAXUS stent now tandem stens of 3.0x54mm & 3.0x40mm placed with the seg. 50,80 & 99% stenosis reduced to 0%  . DDD (degenerative disc disease)   . Dysrhythmia    "PAC's and PVC's"  . Esophageal dilatation    "have had it done 7 times; last time ~ 2001"  . Esophageal stricture   . GERD (gastroesophageal reflux disease)   . Headache(784.0)   . Hiatal hernia   . Hyperlipidemia   . Myocardial infarction ([place]) 1991  . Myocardial infarction [facility]) 2005   "had 3 heart attacks this year"  . Paroxysmal atrial fibrillation (HCC)   . Pyloric stenosis   . RBBB   . Renal artery stenosis (HCC)     hhistory of right renal artery stenting and re-intervention for "in-stent restenosis.  . S/P CABG (coronary artery bypass graft) [place]  . Shortness of breath    "when I had the heart attacks"  . Subclavian artery stenosis, left ([place])   . Ulcer    "small; Dr. Henrene Pastor found it 02/2010"    Past Surgical History:  Procedure Laterality Date  . ADENOIDECTOMY     "when I was an infant"  . APPENDECTOMY  1953  . CARDIOVERSION N/A 04/17/2017   Procedure: CARDIOVERSION;  Surgeon: Jerline Pain, MD;  Location: [facility] ENDOSCOPY;  Service: Cardiovascular;  Laterality: N/A;  . CAROTID ENDARTERECTOMY Right [place]  01/24/11   "today makes a total of 9 stents"  . CORONARY ARTERY BYPASS GRAFT  1984   CABG X 4  . CORONARY ARTERY BYPASS GRAFT  1996   CABG X 4  . LEFT HEART CATHETERIZATION WITH CORONARY/GRAFT ANGIOGRAM N/A 01/24/2011   Procedure: LEFT HEART CATHETERIZATION WITH Beatrix Fetters;  Surgeon: Troy Sine, MD;  Location: [facility] CATH LAB;  Service: Cardiovascular;  Laterality: N/A;  . PERIPHERAL VASCULAR INTERVENTION  07/16/2018   Procedure: PERIPHERAL VASCULAR INTERVENTION;  Surgeon: Elam Dutch, MD;  Location: [place] CV LAB;  Service: Cardiovascular;;  SMA and Celiac  . pylondial cyst removal    . RETINAL DETACHMENT SURGERY  04/2007   right  . TONSILLECTOMY  1972  . VISCERAL ANGIOGRAPHY N/A 07/16/2018   Procedure: VISCERAL ANGIOGRAPHY;  Surgeon: Elam Dutch, MD;  Location: [place] CV LAB;  Service: Cardiovascular;  Laterality: N/A;    Prior to Admission medications   Medication Sig Start Date End Date Taking? Authorizing Provider  butalbital-aspirin-caffeine [facility]) 50-325-40 MG per capsule Take 1 capsule by mouth every 4 (four) hours as needed for headache.    Yes [provider]  diltiazem (CARDIZEM) 30 MG tablet Take one tablet by mouth every 4 hours AS NEEDED  for A-fib HR > 100 as long as BP > 100. 06/30/17  Yes Sherran Needs, NP  dutasteride (AVODART) 0.5 MG capsule Take 0.5 mg by mouth daily.  03/22/14  Yes [provider]  ELIQUIS 5 MG TABS tablet TAKE 1 TABLET BY MOUTH TWICE A DAY Patient taking differently: Take 5 mg by mouth 2 (two) times daily.  05/14/18  Yes Camnitz, Will Hassell Done, MD  esomeprazole (NEXIUM) 40 MG capsule Take 1 capsule (40 mg total) by mouth 2 (two) times daily. 06/11/18  Yes Irene Shipper,  MD  ezetimibe (ZETIA) 10 MG tablet TAKE 1 TABLET (10 MG TOTAL) BY MOUTH DAILY. Patient taking differently: Take 10 mg by mouth daily.  03/05/18  Yes Troy Sine, MD  Ketotifen Fumarate (ALAWAY OP) Place 1 drop into the right eye daily.   Yes [provider]  loratadine (CLARITIN) 10 MG tablet Take 10 mg by mouth daily as needed for allergies, rhinitis or itching.  12/12/13  Yes [provider]  lovastatin (MEVACOR) 40 MG tablet Take 20 mg by mouth at bedtime.   Yes [provider]  metoprolol tartrate (LOPRESSOR) 50 MG tablet Take 1.5 tablets (75 mg total) by mouth 2 (two) times daily. 03/21/18 07/13/18 Yes Camnitz, Ocie Doyne, MD  Multiple Vitamins-Minerals (MULTIVITAMINS THER. W/MINERALS) TABS Take 1 tablet by mouth daily.     Yes [provider]  nitroGLYCERIN (NITROLINGUAL) 0.4 MG/SPRAY spray Place 1 spray under the tongue every 5 (five) minutes x 3 doses as needed for chest pain. 01/01/18  Yes [name], MD  ranolazine (RANEXA) 1000 MG SR tablet Take 500 mg by mouth 2 (two) times daily.   Yes [provider]  torsemide (DEMADEX) 20 MG tablet Take 20 mg by mouth as needed (for fluid). FOR SWELLING OR WEIGHT GAIN   Yes [provider]  vitamin E 400 UNIT capsule Take 400 Units by mouth daily.    Yes [provider]  aspirin EC 81 MG EC tablet Take 1 tablet (81 mg total) by mouth daily. 07/20/18   Kayleen Memos, DO  metoprolol tartrate (LOPRESSOR) 100 MG tablet Take  1 tablet (100 mg total) by mouth 2 (two) times daily. 07/19/18   Kayleen Memos, DO    Scheduled Meds: . apixaban  5 mg Oral BID  . aspirin EC  81 mg Oral Daily  . diltiazem  30 mg Oral Q6H  . dutasteride  0.5 mg Oral Daily  . ezetimibe  10 mg Oral Daily  . metoprolol tartrate  100 mg Oral BID  . pantoprazole  40 mg Oral Daily  . polyethylene glycol  17 g Oral BID  . pravastatin  40 mg Oral q1800  . ranolazine  500 mg Oral BID  . senna-docusate  1 tablet Oral BID  . sodium chloride flush  3 mL Intravenous Q12H  . sucralfate  1 g Oral TID WC & HS   Infusions: . sodium chloride    . D-10-0.45% Sodium Chloride with KCL 40 meq/L 1000 ml 50 mL/hr at 07/20/18 0600   PRN Meds: sodium chloride, acetaminophen, alum & mag hydroxide-simeth, bisacodyl, hydrALAZINE, HYDROcodone-acetaminophen, labetalol, loratadine, [EXPIRED] methocarbamol **FOLLOWED BY** methocarbamol, morphine injection, nitroGLYCERIN, ondansetron **OR** ondansetron (ZOFRAN) IV, oxyCODONE, sodium chloride flush, sodium phosphate, torsemide   Allergies as of 07/12/2018 - Review Complete 07/12/2018  Allergen Reaction Noted  . Codeine Nausea And Vomiting 01/22/2014  . Sulfa antibiotics Rash 01/30/2008  . Sulfonamide derivatives Rash 01/30/2008    Family History  Problem Relation Age of Onset  . Breast cancer Paternal Aunt   . Diabetes Sister   . Ulcers Father   . Heart disease Father   . Heart failure Father   . Heart attack Father   . AAA (abdominal aortic aneurysm) Father   . Stroke Mother   . Hypertension Mother   . Colon cancer Neg Hx     Social History   Socioeconomic History  . Marital status: Married    Spouse name: Not on file  . Number of children: Not  on file  . Years of education: Not on file  . Highest education level: Not on file  Occupational History  . Not on file  Social Needs  . Financial resource strain: Not hard at all  . Food insecurity:    Worry: Never true    Inability: Never true   . Transportation needs:    Medical: No    Non-medical: No  Tobacco Use  . Smoking status: Former Smoker    Packs/day: 1.00    Years: 38.00    Pack years: 38.00    Types: Cigarettes    Last attempt to quit: 02/09/2005    Years since quitting: 13.4  . Smokeless tobacco: Never Used  . Tobacco comment: "quit smoking cigarrettes 2006"  Substance and Sexual Activity  . Alcohol use: No    Alcohol/week: 0.0 standard drinks  . Drug use: No  . Sexual activity: Not on file  Lifestyle  . Physical activity:    Days per week: 0 days    Minutes per session: 0 min  . Stress: Only a little  Relationships  . Social connections:    Talks on phone: Not on file    Gets together: Not on file    Attends religious service: Not on file    Active member of club or organization: No    Attends meetings of clubs or organizations: Never    Relationship status: Married  . Intimate partner violence:    Fear of current or ex partner: No    Emotionally abused: No    Physically abused: No    Forced sexual activity: No  Other Topics Concern  . Not on file  Social History Narrative  . Not on file    REVIEW OF SYSTEMS: Constitutional: Some weakness, overall this is improved. ENT:  No nose bleeds Pulm: No dyspnea.  No cough. CV:  No palpitations, no LE edema.  No chest pain. GU:  No hematuria, no frequency GI: Tends towards constipation at home, uses MiraLAX as needed.  No black or bloody stools. Heme: No unusual bleeding or bruising. Transfusions: Remote, decades ago, history of transfusion. Neuro:  No headaches, no peripheral tingling or numbness Derm:  No itching, no rash or sores.  Endocrine:  No sweats or chills.  No polyuria or dysuria Immunization:  Not queried Travel:  None beyond local counties in last few months.    PHYSICAL EXAM: Vital signs in last 24 hours: Vitals:   07/20/18 0031 07/20/18 0513  BP: 120/80 113/64  Pulse:  74  Resp: 20 10  Temp: 98.2 F (36.8 C) 98.2 F (36.8  C)  SpO2: 98% 99%   Wt Readings from Last 3 Encounters:  07/20/18 60.7 kg  05/25/18 58.5 kg  04/27/18 63.9 kg    General: Pleasant, senior WM.  Resting comfortably in bed.  Does not look acutely ill. Head: No facial asymmetry or swelling.  No signs of head trauma. Eyes: Conjunctiva pale, no scleral icterus.   EOMI. Ears: Not hard of hearing. Nose: No discharge or congestion. Mouth: Oropharynx moist, pink, clear.  No exudates.  Tongue midline.  Only a few teeth remain in his lower jaw. Neck: No JVD, no masses, no thyromegaly. Lungs: Clear bilaterally.  No labored breathing. Heart: Irregularly irregular, rate controlled.  Soft systolic murmur.  S1, S2 present. Abdomen: Soft.  Not tender or distended.  Active bowel sounds.  No HSM, masses, hernias.  Soft bilateral bruits mid abdomen.  No pulsatile masses. Rectal: Deferred Musc/Skeltl: No  joint redness, swelling.  Healed/healing scar on right hip. Extremities: No CCE. Neurologic: Alert.  Oriented x3.  Moves all 4 limbs, strength not tested.  No tremors.  No gross deficits. Skin: No significant bruising, purpura, rash or sores, suspicious lesions. Tattoos: None observed. Nodes: No cervical or inguinal adenopathy. Psych: Calm, normal fluid speech.  Intake/Output from previous day: 05/14 0701 - 05/15 0700 In: 985.2 [P.O.:360; I.V.:625.2] Out: 275 [Urine:275] Intake/Output this shift: No intake/output data recorded.  LAB RESULTS: Recent Labs    07/18/18 0326 07/19/18 0246  WBC 9.0 6.5  HGB 7.9* 7.9*  HCT 22.7* 23.0*  PLT 178 182   BMET Lab Results  Component Value Date   NA 130 (L) 07/19/2018   NA 132 (L) 07/18/2018   NA 133 (L) 07/17/2018   K 4.3 07/19/2018   K 4.3 07/18/2018   K 4.4 07/17/2018   CL 99 07/19/2018   CL 99 07/18/2018   CL 105 07/17/2018   CO2 23 07/19/2018   CO2 23 07/18/2018   CO2 21 (L) 07/17/2018   GLUCOSE 123 (H) 07/19/2018   GLUCOSE 133 (H) 07/18/2018   GLUCOSE 111 (H) 07/17/2018   BUN 25  (H) 07/19/2018   BUN 27 (H) 07/18/2018   BUN 29 (H) 07/17/2018   CREATININE 1.29 (H) 07/19/2018   CREATININE 1.36 (H) 07/18/2018   CREATININE 1.38 (H) 07/17/2018   CALCIUM 8.3 (L) 07/19/2018   CALCIUM 8.6 (L) 07/18/2018   CALCIUM 8.4 (L) 07/17/2018   LFT No results for input(s): PROT, ALBUMIN, AST, ALT, ALKPHOS, BILITOT, BILIDIR, IBILI in the last 72 hours. PT/INR Lab Results  Component Value Date   INR 1.08 01/24/2011   Hepatitis Panel No results for input(s): HEPBSAG, HCVAB, HEPAIGM, HEPBIGM in the last 72 hours. C-Diff No components found for: CDIFF Lipase     Component Value Date/Time   LIPASE 10.0 (L) 08/06/2009 1601    Drugs of Abuse  No results found for: LABOPIA, COCAINSCRNUR, LABBENZ, AMPHETMU, THCU, LABBARB   RADIOLOGY STUDIES: No results found.    IMPRESSION:   *    Postprandial burning chest pain, sounds esophageal in origin.    Hx GERD, esophageal strictures, gastric ulcer.  Hx colon polyps, bleeding cecal AVM.  Surveillance colonoscopy due 2021.    Ischemic colitis per CT.   Wonder if the change in PPI coverage has led to his current acute issues with chest pain.  *    Mesenteric ischemia.  SMA stenosis. Extensive hx peripheral vascular disease.  Status post SMA and celiac stent placement 07/16/2018 Ischemic colitis per CT scan.  Abdominal pain resolved.    *   Normocytic anemia.  S/p 1 U PRBC  *   Hyponatremia.    *   AKI >> CKD?.    *    NSTEMI.  Likely type II in association with anemia related demand ischemia.  Peak troponin 5.16.  Cardiology has no plans for further evaluation. Previous CABGs and PCIs  *    Persistent atrial fibrillation, rate controlled.  Chads vascular score 4.  Eliquis restarted 5/13, low dose ASA never stopped.  Last Eliquid was 5/14 in evening.   *    Recent repair right hip fracture.    PLAN:     *    Increase Protonix to 40 mg p.o. BID.  At present no plans for upper endoscopy after discussing case with Dr  Rush Landmark.     Azucena Freed  07/20/2018, 8:18 AM Phone [phone]

## 2018-07-19 NOTE — Progress Notes (Signed)
Cardiac Rehab Advisory Cardiac Rehab Phase I is not seeing pts face to face at this time due to Covid 19 restrictions. Ambulation is occurring through nursing, PT, and mobility teams. We will help facilitate that process as needed. We are calling pts in their rooms and discussing education. We will then deliver education materials to pts RN for delivery to pt.   Noted pt is medically treated MI. Wrote order for [employer] for education purposes. Spoke to pt on phone and discussed MI, increasing exercise as tolerated (along with HHPT), NTG use if CP and CRPII. Will refer to [place]. Pt did program for 12 years previously and he will eventually be interested. Gave pt MI book and Heart Healthy diet. Good reception. [id number] [name] CES, ACSM 10:39 AM 07/19/2018

## 2018-07-19 NOTE — Telephone Encounter (Signed)
sch appt spk to pt wife mld ltr 08/23/2018 9am mesenteric 10am p/o MD

## 2018-07-19 NOTE — Telephone Encounter (Signed)
-----   Message from Elam Dutch, MD sent at 07/16/2018 10:54 AM EDT ----- Procedure: Ultrasound left groin, abdominal aortogram, selective celiac and superior mesenteric angiogram, celiac artery stent (6 x 15), superior mesenteric artery stent (6 x 27)  The patient now has a widely patent celiac and superior mesenteric artery.  He will need to be maintained on Plavix and aspirin.  He will need a follow-up mesenteric duplex scan in 1 month.  Ruta Hinds, MD [facility] Office: [phone] Pager: [phone]

## 2018-07-19 NOTE — Progress Notes (Signed)
PT Cancellation Note  Patient Details Name: [name] MRN: [id number] DOB: [birth year]   Cancelled Treatment:    Reason Eval/Treat Not Completed: Patient declined, no reason specified.  Pt /RN stated pt has been sick and vomiting today.  Will see as able 5/15. 07/19/2018  Donnella Sham, PT Acute Rehabilitation Services [phone]  (pager) [phone]  (office)   Tessie Fass Sharrie Self 07/19/2018, 5:42 PM

## 2018-07-19 NOTE — Discharge Summary (Signed)
Discharge Summary  [name] CBS:[id number] DOB: Dec 30, 1945  PCP: Garwin Brothers, MD  Admit date: 07/12/2018 Discharge date: 07/19/2018  Time spent: 35 minutes  Recommendations for Outpatient Follow-up:  1. Follow-up with your cardiologist 2. Follow-up with vascular surgery 3. Follow-up with orthopedic surgery 4. Follow-up with your primary care provider 5. Continue physical therapy 6. Fall precautions 7. Take your medications as prescribed  Discharge Diagnoses:  Active Hospital Problems   Diagnosis Date Noted   NSTEMI (non-ST elevated myocardial infarction) ([place]) 07/12/2018   Mesenteric ischemia (HCC)    Fever 07/12/2018   Lobar pneumonia ([place]) 07/12/2018   Paroxysmal atrial fibrillation ([place])    Hyperlipidemia LDL goal <70 01/16/2014   Carotid disease, bilateral ([place]) 03/06/2013   PVD (peripheral vascular disease) ([place]) 01/26/2011   Coronary artery disease 01/24/2011   Essential hypertension 04/24/2007   Hx of CABG '84, '96, last PCI 2012 04/24/2007   GASTROESOPHAGEAL REFLUX DISEASE 04/24/2007    Resolved Hospital Problems  No resolved problems to display.    Discharge Condition: Stable  Diet recommendation: Resume previous diet.  Heart healthy diet.  Vitals:   07/19/18 0446 07/19/18 1000  BP: 106/64 116/65  Pulse: 75 85  Resp: 19 (!) 24  Temp: 98.3 F (36.8 C) 98.1 F (36.7 C)  SpO2: 96% 97%    History of present illness:  73 year old male with history of coronary artery disease status post CABG x2 in 1984 and 1996, subsequent MI with multiple PCI's, peripheral vascular disease status post CEA in 1906 and renal artery stenting in 2005, paroxysmal A. fib on Eliquis, GERD and recent right hip fracture status post repair at [facility] and subsequent discharged on 07/07/2018 was transferred from [facility] for fever, cough and shortness of breath. COVID-19 testing was negative. Troponin was 2.70. CT angiogram of the chest showed no PE but  areas of mild mucus impaction in the right lower lobe along with severe stenosis of the SMA origin. Cardiology was consulted and patient was transferred to [facility] and started on heparin drip. Vascular surgery was consulted.  Post SMA celiac stent on 07/16/2018.  His anticoagulant was held due to drop in hemoglobin from 7.1 to 6.6.  Was transfused 1 unit PRBC on 07/17/18.  Resumed Eliquis on 07/18/2018.  Hemoglobin stable at 7.9 on 07/19/2018 from 7.9 on 07/18/2018.  No sign of overt bleeding.  07/19/18: Patient seen and examined at his bedside.  This morning he reports no pain no dyspnea no palpitations.  Stated he felt well.  Early this afternoon received a call from his nurse revealing that he had had poor oral intake with nausea and vomiting.  This delayed his discharge to home with home health services.   Hospital Course:  Principal Problem:   NSTEMI (non-ST elevated myocardial infarction) [facility]) Active Problems:   Essential hypertension   Hx of CABG '84, '96, last PCI 2012   GASTROESOPHAGEAL REFLUX DISEASE   PVD (peripheral vascular disease) ([place])   Carotid disease, bilateral ([place])   Hyperlipidemia LDL goal <70   Paroxysmal atrial fibrillation (HCC)   Coronary artery disease   Fever   Lobar pneumonia (HCC)   Mesenteric ischemia (HCC)  NSTEMI Troponin peaked at 5.16 and trended down Denies chest pain this morning Cardiology followed and signed off on 07/17/2018 Continue to closely monitor on telemetry  Status post SMA celiac stent Follow-up mesenteric duplex 1 month Continue aspirin Resume Eliquis  Possible ischemic colitis affecting descending and sigmoid colon Afebrile with no leukocytosis Sudden  abd pain and nausea this afternoon Last CT abd pelvis 07/13/18 with findings above Closely monitor  Low threshold to repeat CT abd pelvis GI consult  Intractable nausea and vomiting IV Zofran as needed  GERD Continue PPI  CKD 3  Baseline creatinine appears to be  1.2 with GFR 57 Creatinine 1.29 from 1.36 Continue to avoid nephrotoxic agents Monitor urine output Repeat BMP in the morning  Mild hypovolemic hyponatremia Sodium 130 from 132 Continue close monitoring  Chronic normocytic anemia Post 1 unit PRBC transfusion on 07/17/2018 Hemoglobin stable this morning at 7.9 from 7.9 yesterday Continue Eliquis Repeat CBC in the morning  Paroxysmal A. fib Rate controlled Continue cardiac medications Continue Eliquis  Post recent right hip surgery Pain management in place as needed Physical therapy recommend home health PT Follow-up with your orthopedic surgeon within 4 weeks with no x-rays   Code Status:Full   Consultants:Cardiology/vascular surgery/orthopedic surgery/GI  Procedures:Angiogram and superior mesenteric artery and celiac artery stenting  Antimicrobials: Completed course of cefepime  Vancomycin 07/12/2018-07/14/2018    Discharge Exam: BP 116/65 (BP Location: Right Arm)    Pulse 85    Temp 98.1 F (36.7 C) (Oral)    Resp (!) 24    Ht 5\' 5"  (1.651 m)    Wt 61.3 kg    SpO2 97%    BMI 22.48 kg/m   General: 73 y.o. year-old male well developed well nourished.  Somnolent this morning.  No acute distress and responded to questions appropriately.  Cardiovascular: Regular rate and rhythm with no rubs or gallops.  No thyromegaly or JVD noted.    Respiratory: Clear to auscultation with no wheezes or rales. Good inspiratory effort.  Abdomen: Soft nontender nondistended with normal bowel sounds x4 quadrants.  Musculoskeletal: 1+ pitting edema in lower extremities bilaterally  Psychiatry: Mood is appropriate for condition and setting  Discharge Instructions You were cared for by a hospitalist during your hospital stay. If you have any questions about your discharge medications or the care you received while you were in the hospital after you are discharged, you can call the unit and asked to speak with the hospitalist on  call if the hospitalist that took care of you is not available. Once you are discharged, your primary care physician will handle any further medical issues. Please note that NO REFILLS for any discharge medications will be authorized once you are discharged, as it is imperative that you return to your primary care physician (or establish a relationship with a primary care physician if you do not have one) for your aftercare needs so that they can reassess your need for medications and monitor your lab values.  Discharge Instructions    Amb Referral to Cardiac Rehabilitation   Complete by:  As directed    Diagnosis:  NSTEMI   After initial evaluation and assessments completed: Virtual Based Care may be provided alone or in conjunction with Phase 2 Cardiac Rehab based on patient barriers.:  Yes     Allergies as of 07/19/2018      Reactions   Codeine Nausea And Vomiting   Sulfa Antibiotics Rash   Sulfonamide Derivatives Rash      Medication List    STOP taking these medications   butalbital-aspirin-caffeine 50-325-40 MG capsule Commonly known as:  FIORINAL     TAKE these medications   ALAWAY OP Place 1 drop into the right eye daily.   aspirin 81 MG EC tablet Take 1 tablet (81 mg total) by mouth daily. Start  taking on:  Jul 20, 2018   diltiazem 30 MG tablet Commonly known as:  Cardizem Take one tablet by mouth every 4 hours AS NEEDED for A-fib HR > 100 as long as BP > 100.   dutasteride 0.5 MG capsule Commonly known as:  AVODART Take 0.5 mg by mouth daily.   Eliquis 5 MG Tabs tablet Generic drug:  apixaban TAKE 1 TABLET BY MOUTH TWICE A DAY What changed:  how much to take   esomeprazole 40 MG capsule Commonly known as:  NEXIUM Take 1 capsule (40 mg total) by mouth 2 (two) times daily.   ezetimibe 10 MG tablet Commonly known as:  ZETIA TAKE 1 TABLET (10 MG TOTAL) BY MOUTH DAILY. What changed:  See the new instructions.   loratadine 10 MG tablet Commonly known as:   CLARITIN Take 10 mg by mouth daily as needed for allergies, rhinitis or itching.   lovastatin 40 MG tablet Commonly known as:  MEVACOR Take 20 mg by mouth at bedtime.   metoprolol tartrate 100 MG tablet Commonly known as:  LOPRESSOR Take 1 tablet (100 mg total) by mouth 2 (two) times daily. What changed:    medication strength  how much to take   multivitamins ther. w/minerals Tabs tablet Take 1 tablet by mouth daily.   nitroGLYCERIN 0.4 MG/SPRAY spray Commonly known as:  NITROLINGUAL Place 1 spray under the tongue every 5 (five) minutes x 3 doses as needed for chest pain.   Ranexa 1000 MG SR tablet Generic drug:  ranolazine Take 500 mg by mouth 2 (two) times daily.   torsemide 20 MG tablet Commonly known as:  DEMADEX Take 20 mg by mouth as needed (for fluid). FOR SWELLING OR WEIGHT GAIN   vitamin E 400 UNIT capsule Take 400 Units by mouth daily.      Allergies  Allergen Reactions   Codeine Nausea And Vomiting   Sulfa Antibiotics Rash   Sulfonamide Derivatives Rash   Follow-up Information    Almyra Deforest, PA Follow up.   Specialties:  Cardiology, Radiology Why:  You have a virtual follow-up visit schedule for 08/02/2018 at 9:00am with Almyra Deforest, one of [name] PAs.  Contact information: [address] [place] [address] [phone]        Elam Dutch, MD In 4 weeks.   Specialties:  Vascular Surgery, Cardiology Why:  Office will call you to arrange your appt (sent) Contact information: [place] [address] [place], [facility] Follow up.   Specialty:  [place] Why:  They will continue to do your home health care at your home Contact information: [place] [address] [phone]            The results of significant diagnostics from this hospitalization (including imaging, microbiology, ancillary and laboratory) are listed below for  reference.    Significant Diagnostic Studies: Dg Abd Portable 2v  Result Date: 07/13/2018 CLINICAL DATA:  Centralized abdominal pain for the past 5 days. EXAM: PORTABLE ABDOMEN - 2 VIEW COMPARISON:  01/19/2010 FINDINGS: Large stool burden within the cecum and ascending colon. This finding is associated with marked distension of the colon with the cecum measuring approximately they are. Mild gaseous distention of the upstream small bowel with index loop of small bowel within the mid hemiabdomen measuring approximately 3 cm. No pneumoperitoneum, pneumatosis or portal venous gas. Limited visualization of the lower thorax demonstrates  sequela prior median sternotomy. Mild scoliotic curvature of the thoracolumbar spine with associated moderate multilevel lumbar spine DDD, incompletely evaluated. Post right femoral neck dynamic screw fixation, incompletely evaluated. IMPRESSION: Large colonic stool burden with findings most suggestive of adynamic ileus though conceivably a distal colonic obstruction could result in a similar appearance. Clinical correlation is advised. Electronically Signed   By: Sandi Mariscal M.D.   On: 07/13/2018 09:30   Ct Angio Abd/pel W/ And/or W/o  Result Date: 07/13/2018 : Alerts CLINICAL DATA:  Chest pain, shortness of breath, abdominal pain x2 days, weight loss EXAM: CTA ABDOMEN AND PELVIS WITH CONTRAST TECHNIQUE: Multidetector CT imaging of the abdomen and pelvis was performed using the standard protocol during bolus administration of intravenous contrast. Multiplanar reconstructed images and MIPs were obtained and reviewed to evaluate the vascular anatomy. CONTRAST:  151mL OMNIPAQUE IOHEXOL 350 MG/ML SOLN COMPARISON:  07/12/2018, 08/07/2009 FINDINGS: VASCULAR Coronary calcifications. Aorta: Tortuous. Moderate scattered calcified atheromatous plaque. Minimal eccentric nonocclusive mural thrombus in the suprarenal segment. No aneurysm, dissection, or stenosis. Celiac: Short-segment origin  stenosis of probable hemodynamic significance, ectatic distally. SMA: Partially calcified ostial plaque/thrombus extending over length of approximately 1.7 cm, resulting in short segment relatively high-grade stenosis. Distally there is a tandem lesion of eccentric partially calcified plaque without high-grade stenosis. Classic distal branch anatomy. Renals: Patent right renal ostial stent with a degree of in stent restenosis of indeterminate hemodynamic severity, patent distally. Duplicated left renal arteries, superior dominant and widely patent. There is calcified plaque at the origin of the more diminutive inferior left renal artery resulting in stenosis of indeterminate hemodynamic significance. IMA: Short-segment origin occlusion or high-grade stenosis, patent distally reconstituted by visceral collaterals. Inflow: Scattered calcified plaque throughout the iliac arterial systems. Linear intraluminal filling defect in the distal right external iliac artery possibly short dissection flap of indeterminate hemodynamic significance. Origin stenosis of the right internal iliac artery with fusiform dilatation poststenotic dilatation up to 1.3 cm diameter. Proximal Outflow: Atheromatous common femoral arteries. Origin occlusion of bilateral SFA. Deep femoral branches patent. Veins: Patent hepatic veins, portal vein, SMV, splenic vein, bilateral renal veins, IVC and iliac venous system. No venous pathology identified. Review of the MIP images confirms the above findings. NON-VASCULAR Lower chest: Small pleural effusions right greater than left. Atelectasis/consolidation in the lung bases right greater than left. Previous median sternotomy. Hepatobiliary: Periportal edema. Gallbladder physiologically distended. No discrete liver lesion. No biliary ductal dilatation. Pancreas: Unremarkable. No pancreatic ductal dilatation or surrounding inflammatory changes. Spleen: Normal in size without focal abnormality.  Adrenals/Urinary Tract: Normal adrenals. Unremarkable kidneys. No hydronephrosis. Urinary bladder incompletely distended, moderately thick-walled. Stomach/Bowel: Stomach is nondistended. Small bowel is nondilated. Moderate colonic fecal material without dilatation. There is circumferential wall thickening suspected in the descending and sigmoid colon with incomplete luminal distention. No definite adjacent inflammatory/edematous change. Lymphatic: No abdominal or pelvic adenopathy. Reproductive: Prostate is unremarkable. Other: Trace pelvic and perihepatic ascites. No free air. Musculoskeletal: Spondylitic changes throughout the lumbar spine. Sliding screw and IM rod fixation across a comminuted right femoral intertrochanteric fracture. No acute fracture or worrisome bone lesion. IMPRESSION: VASCULAR 1. Significant origin stenoses of celiac axis, SMA, and IMA; resultant increased risk of occlusive mesenteric ischemia. 2. Short-segment dissection flap in the distal right external iliac artery, of indeterminate hemodynamic significance. Correlate with clinical symptomatology and ABIs. 3. Patent right renal artery ostial stent with a degree of in-stent stenosis of indeterminate hemodynamic significance. 4. Stenosis of the inferior left renal artery of indeterminate hemodynamic significance. 5. Proximal  stenosis of the right internal iliac artery, with 1.3 cm poststenotic dilatation. NON-VASCULAR:. 1. Circumferential wall thickening in descending and sigmoid colon consistent with colitis, possibly ischemic given the vascular findings. 2. Small bilateral pleural effusions with bibasilar atelectasis/consolidation. 3. Small amount of abdominal ascites. 4. Thick-walled urinary bladder suggesting a degree of bladder outlet obstruction. 5. Postop and degenerative changes as above. Electronically Signed   By: Lucrezia Europe M.D.   On: 07/13/2018 17:09    Microbiology: Recent Results (from the past 240 hour(s))  Culture, blood  (Routine X 2) w Reflex to ID Panel     Status: None   Collection Time: 07/12/18 11:36 PM  Result Value Ref Range Status   Specimen Description BLOOD RIGHT ARM  Final   Special Requests   Final    BOTTLES DRAWN AEROBIC ONLY Blood Culture adequate volume   Culture   Final    NO GROWTH 5 DAYS Performed at [facility], [address] [place], [place] [address]    Report Status 07/17/2018 FINAL  Final  Culture, blood (Routine X 2) w Reflex to ID Panel     Status: None   Collection Time: 07/12/18 11:38 PM  Result Value Ref Range Status   Specimen Description BLOOD RIGHT HAND  Final   Special Requests   Final    BOTTLES DRAWN AEROBIC ONLY Blood Culture adequate volume   Culture   Final    NO GROWTH 5 DAYS Performed at [facility], [place] [address] [place], [place] [address]    Report Status 07/17/2018 FINAL  Final  SARS Coronavirus 2 (CEPHEID- Performed in [place] hospital lab), Hosp Order     Status: None   Collection Time: 07/13/18  9:42 AM  Result Value Ref Range Status   SARS Coronavirus 2 NEGATIVE NEGATIVE Final    Comment: (NOTE) If result is NEGATIVE SARS-CoV-2 target nucleic acids are NOT DETECTED. The SARS-CoV-2 RNA is generally detectable in upper and lower  respiratory specimens during the acute phase of infection. The lowest  concentration of SARS-CoV-2 viral copies this assay can detect is 250  copies / mL. A negative result does not preclude SARS-CoV-2 infection  and should not be used as the sole basis for treatment or other  patient management decisions.  A negative result may occur with  improper specimen collection / handling, submission of specimen other  than nasopharyngeal swab, presence of viral mutation(s) within the  areas targeted by this assay, and inadequate number of viral copies  (<250 copies / mL). A negative result must be combined with clinical  observations, patient history, and epidemiological information. If result is  POSITIVE SARS-CoV-2 target nucleic acids are DETECTED. The SARS-CoV-2 RNA is generally detectable in upper and lower  respiratory specimens dur ing the acute phase of infection.  Positive  results are indicative of active infection with SARS-CoV-2.  Clinical  correlation with patient history and other diagnostic information is  necessary to determine patient infection status.  Positive results do  not rule out bacterial infection or co-infection with other viruses. If result is PRESUMPTIVE POSTIVE SARS-CoV-2 nucleic acids MAY BE PRESENT.   A presumptive positive result was obtained on the submitted specimen  and confirmed on repeat testing.  While 2019 novel coronavirus  (SARS-CoV-2) nucleic acids may be present in the submitted sample  additional confirmatory testing may be necessary for epidemiological  and / or clinical management purposes  to differentiate between  SARS-CoV-2 and other Sarbecovirus currently known  to infect humans.  If clinically indicated additional testing with an alternate test  methodology [phone]) is advised. The SARS-CoV-2 RNA is generally  detectable in upper and lower respiratory sp ecimens during the acute  phase of infection. The expected result is Negative. Fact Sheet for Patients:  StrictlyIdeas.no Fact Sheet for Healthcare Providers: BankingDealers.co.za This test is not yet approved or cleared by the [place] FDA and has been authorized for detection and/or diagnosis of SARS-CoV-2 by FDA under an Emergency Use Authorization (EUA).  This EUA will remain in effect (meaning this test can be used) for the duration of the COVID-19 declaration under Section 564(b)(1) of the Act, 21 U.S.C. section 360bbb-3(b)(1), unless the authorization is terminated or revoked sooner. Performed at [facility], [place] [address], [place], [place] [address]      Labs: Basic Metabolic Panel: Recent Labs  Lab  07/14/18 [phone] 07/15/18 0519 07/16/18 0207 07/17/18 0326 07/18/18 0326 07/19/18 0246  NA 130* 131* 133* 133* 132* 130*  K 3.1* 3.8 4.0 4.4 4.3 4.3  CL 99 100 99 105 99 99  CO2 20* 20* 21* 21* 23 23  GLUCOSE 102* 124* 124* 111* 133* 123*  BUN 30* 32* 36* 29* 27* 25*  CREATININE 1.24 1.31* 1.37* 1.38* 1.36* 1.29*  CALCIUM 8.0* 8.1* 8.5* 8.4* 8.6* 8.3*  MG 1.8 1.7  --  1.9 1.9  --    Liver Function Tests: Recent Labs  Lab 07/12/18 2336 07/13/18 0354 07/14/18 0337 07/17/18 0326  AST 31 44* 46* 21  ALT 14 17 23 20   ALKPHOS 56 60 52 60  BILITOT 0.9 0.8 0.9 0.7  PROT 5.4* 5.7* 5.2* 5.2*  ALBUMIN 2.7* 2.8* 2.4* 2.5*   No results for input(s): LIPASE, AMYLASE in the last 168 hours. No results for input(s): AMMONIA in the last 168 hours. CBC: Recent Labs  Lab 07/12/18 2336  07/14/18 0337 07/15/18 0519 07/16/18 0207 07/17/18 0326 07/18/18 0326 07/19/18 0246  WBC 19.4*   < > 13.4* 10.8* 7.7 8.0 9.0 6.5  NEUTROABS 17.3*  --  11.6*  --   --   --  7.0  --   HGB 7.9*   < > 7.5* 7.1* 7.1* 6.6* 7.9* 7.9*  HCT 23.1*   < > 21.3* 20.7* 20.9* 19.6* 22.7* 23.0*  MCV 96.3   < > 94.7 95.0 94.6 96.1 92.3 93.1  PLT 222   < > 208 206 224 187 178 182   < > = values in this interval not displayed.   Cardiac Enzymes: Recent Labs  Lab 07/13/18 0733 07/13/18 1338 07/13/18 2005  TROPONINI 5.16* 4.90* 4.00*   BNP: BNP (last 3 results) No results for input(s): BNP in the last 8760 hours.  ProBNP (last 3 results) No results for input(s): PROBNP in the last 8760 hours.  CBG: No results for input(s): GLUCAP in the last 168 hours.     Signed:  Kayleen Memos, MD [facility] 07/19/2018, 1:28 PM

## 2018-07-20 DIAGNOSIS — R1013 Epigastric pain: Secondary | ICD-10-CM

## 2018-07-20 DIAGNOSIS — Z8719 Personal history of other diseases of the digestive system: Secondary | ICD-10-CM

## 2018-07-20 DIAGNOSIS — R1110 Vomiting, unspecified: Secondary | ICD-10-CM

## 2018-07-20 DIAGNOSIS — K279 Peptic ulcer, site unspecified, unspecified as acute or chronic, without hemorrhage or perforation: Secondary | ICD-10-CM

## 2018-07-20 DIAGNOSIS — Z7901 Long term (current) use of anticoagulants: Secondary | ICD-10-CM

## 2018-07-20 LAB — CBC WITH DIFFERENTIAL/PLATELET
Abs Immature Granulocytes: 0.15 10*3/uL — ABNORMAL HIGH (ref 0.00–0.07)
Basophils Absolute: 0 10*3/uL (ref 0.0–0.1)
Basophils Relative: 0 %
Eosinophils Absolute: 0.1 10*3/uL (ref 0.0–0.5)
Eosinophils Relative: 1 %
HCT: 25.3 % — ABNORMAL LOW (ref 39.0–52.0)
Hemoglobin: 8.4 g/dL — ABNORMAL LOW (ref 13.0–17.0)
Immature Granulocytes: 2 %
Lymphocytes Relative: 8 %
Lymphs Abs: 0.7 10*3/uL (ref 0.7–4.0)
MCH: 31.6 pg (ref 26.0–34.0)
MCHC: 33.2 g/dL (ref 30.0–36.0)
MCV: 95.1 fL (ref 80.0–100.0)
Monocytes Absolute: 1.1 10*3/uL — ABNORMAL HIGH (ref 0.1–1.0)
Monocytes Relative: 13 %
Neutro Abs: 5.9 10*3/uL (ref 1.7–7.7)
Neutrophils Relative %: 76 %
Platelets: 202 10*3/uL (ref 150–400)
RBC: 2.66 MIL/uL — ABNORMAL LOW (ref 4.22–5.81)
RDW: 16.8 % — ABNORMAL HIGH (ref 11.5–15.5)
WBC: 7.9 10*3/uL (ref 4.0–10.5)
nRBC: 0 % (ref 0.0–0.2)

## 2018-07-20 LAB — BPAM RBC
Blood Product Expiration Date: 202005162359
Blood Product Expiration Date: 202005292359
ISSUE DATE / TIME: 202005121707
Unit Type and Rh: 9500
Unit Type and Rh: 9500

## 2018-07-20 LAB — COMPREHENSIVE METABOLIC PANEL
ALT: 14 U/L (ref 0–44)
AST: 15 U/L (ref 15–41)
Albumin: 2.8 g/dL — ABNORMAL LOW (ref 3.5–5.0)
Alkaline Phosphatase: 83 U/L (ref 38–126)
Anion gap: 6 (ref 5–15)
BUN: 24 mg/dL — ABNORMAL HIGH (ref 8–23)
CO2: 24 mmol/L (ref 22–32)
Calcium: 8.4 mg/dL — ABNORMAL LOW (ref 8.9–10.3)
Chloride: 100 mmol/L (ref 98–111)
Creatinine, Ser: 1.27 mg/dL — ABNORMAL HIGH (ref 0.61–1.24)
GFR calc Af Amer: >60 mL/min (ref >60)
GFR calc non Af Amer: 55 mL/min — ABNORMAL LOW (ref >60)
Glucose, Bld: 131 mg/dL — ABNORMAL HIGH (ref 70–99)
Potassium: 4.3 mmol/L (ref 3.5–5.1)
Sodium: 130 mmol/L — ABNORMAL LOW (ref 135–145)
Total Bilirubin: 0.9 mg/dL (ref 0.3–1.2)
Total Protein: 5.5 g/dL — ABNORMAL LOW (ref 6.5–8.1)

## 2018-07-20 LAB — TYPE AND SCREEN
ABO/RH(D): O NEG
Antibody Screen: POSITIVE
Donor AG Type: NEGATIVE
Donor AG Type: NEGATIVE
PT AG Type: NEGATIVE
Unit division: 0
Unit division: 0

## 2018-07-20 MED ORDER — SUCRALFATE 1 GM/10ML PO SUSP: 1.0000 g | Freq: Three times a day (TID) | ORAL | 0 refills | Status: DC

## 2018-07-20 MED ORDER — PANTOPRAZOLE SODIUM 40 MG PO TBEC
40.0000 mg | DELAYED_RELEASE_TABLET | Freq: Two times a day (BID) | ORAL | Status: DC
  Administered 2018-07-20 – 2018-07-21 (×3): 40 mg via ORAL
  Filled 2018-07-20 (×3): qty 1

## 2018-07-20 MED ORDER — ALUM & MAG HYDROXIDE-SIMETH 200-200-20 MG/5ML PO SUSP
30.0000 mL | Freq: Once | ORAL | Status: AC
  Administered 2018-07-20: 01:00:00 30 mL via ORAL
  Filled 2018-07-20: qty 30

## 2018-07-20 MED ORDER — ADULT MULTIVITAMIN W/MINERALS CH
1.0000 | ORAL_TABLET | Freq: Every day | ORAL | Status: DC
  Administered 2018-07-20 – 2018-07-21 (×2): 1 via ORAL
  Filled 2018-07-20 (×2): qty 1

## 2018-07-20 MED ORDER — BOOST / RESOURCE BREEZE PO LIQD CUSTOM
1.0000 | Freq: Two times a day (BID) | ORAL | Status: DC
  Administered 2018-07-20 – 2018-07-21 (×2): 1 via ORAL

## 2018-07-20 MED ORDER — LIDOCAINE VISCOUS HCL 2 % MT SOLN
15.0000 mL | Freq: Once | OROMUCOSAL | Status: AC
  Administered 2018-07-20: 15 mL via ORAL
  Filled 2018-07-20: qty 15

## 2018-07-20 NOTE — Progress Notes (Signed)
Progress Note  [name] XFG:[id number] DOB: [birth year]  PCP: Garwin Brothers, MD  Admit date: 07/12/2018  DISCHARGE DELAYED DUE TO PERSISTENT POSTPRANDIAL BURNING CHEST PAIN.  Recommendations for Outpatient Follow-up:  1. Follow-up with your cardiologist 2. Follow-up with vascular surgery 3. Follow-up with orthopedic surgery 4. Follow-up with your primary care provider 5. Continue physical therapy 6. Fall precautions 7. Take your medications as prescribed  Discharge Diagnoses:  Active Hospital Problems   Diagnosis Date Noted   NSTEMI (non-ST elevated myocardial infarction) ([place]) 07/12/2018   Mesenteric ischemia (H[facility])    Fever 07/12/2018   Lobar pneumonia ([place]) 07/12/2018   Paroxysmal atrial fibrillation ([place])    Hyperlipidemia LDL goal <70 01/16/2014   Carotid disease, bilateral ([place]) 03/06/2013   PVD (peripheral vascular disease) ([place]) 01/26/2011   Coronary artery disease 01/24/2011   Essential hypertension 04/24/2007   Hx of CABG '84, '96, last PCI 2012 04/24/2007   GASTROESOPHAGEAL REFLUX DISEASE 04/24/2007    Resolved Hospital Problems  No resolved problems to display.    Discharge Condition: Stable  Diet recommendation: Resume previous diet.  Heart healthy diet.  Vitals:   07/20/18 0031 07/20/18 0513  BP: 120/80 113/64  Pulse:  74  Resp: 20 10  Temp: 98.2 F (36.8 C) 98.2 F (36.8 C)  SpO2: 98% 99%    History of present illness:  73 year old male with history of coronary artery disease status post CABG x2 in 1984 and 1996, subsequent MI with multiple PCI's, peripheral vascular disease status post CEA in 1906 and renal artery stenting in 2005, paroxysmal A. fib on Eliquis, GERD and recent right hip fracture status post repair at [facility] and subsequent discharged on 07/07/2018 was transferred from [facility] for fever, cough and shortness of breath. COVID-19 testing was negative. Troponin was 2.70. CT angiogram of the chest showed  no PE but areas of mild mucus impaction in the right lower lobe along with severe stenosis of the SMA origin. Cardiology was consulted and patient was transferred to [facility] and started on heparin drip. Vascular surgery was consulted.  Post SMA celiac stent on 07/16/2018.  His anticoagulant was held due to drop in hemoglobin from 7.1 to 6.6.  Was transfused 1 unit PRBC on 07/17/18.  Resumed Eliquis on 07/18/2018.  Hemoglobin stable at 7.9 on 07/19/2018 from 7.9 on 07/18/2018.  No sign of overt bleeding.  07/19/18: Patient seen and examined at his bedside.  This morning he reports no pain no dyspnea no palpitations.  Stated he felt well.  Early this afternoon received a call from his nurse revealing that he had had poor oral intake with nausea and vomiting.  This delayed his discharge to home with home health services.  07/20/18: Patient seen and examined at his bedside.  Reports persistent postprandial chest burning sensation which is improved with Carafate.  Also intermittent sensation of food stuck in his throat.  He has no other complaints.  GI consulted and following.  Reports followed by Dr. Henrene Pastor.  Has had 7 esophageal dilatation and last colonoscopy was 4 years ago.  Next colonoscopy planned in 2021.   Hospital Course:  Principal Problem:   NSTEMI (non-ST elevated myocardial infarction) [facility]) Active Problems:   Essential hypertension   Hx of CABG '84, '96, last PCI 2012   GASTROESOPHAGEAL REFLUX DISEASE   PVD (peripheral vascular disease) ([place])   Carotid disease, bilateral ([place])   Hyperlipidemia LDL goal <70   Paroxysmal atrial fibrillation (HCC)   Coronary artery disease  Fever   Lobar pneumonia (HCC)   Mesenteric ischemia (HCC)  NSTEMI Troponin peaked at 5.16 and trended down Cardiology followed and signed off on 07/17/2018 Continue to closely monitor on telemetry  Postprandial burning chest pain, likely esophageal in origin GI consulted and following History of  esophageal strictures with dilatation x7 Currently on Carafate and Protonix Protonix 40 mg twice daily as recommended by GI  Status post SMA celiac stent Follow-up mesenteric duplex 1 month On aspirin and Eliquis per vascular surgery  Possible ischemic colitis affecting descending and sigmoid colon Afebrile with no leukocytosis Sudden abd pain and nausea this afternoon Last CT abd pelvis 07/13/18 with findings above Currently denies abdominal pain Continue to closely monitor  Resolved intractable nausea and vomiting IV Zofran as needed  GERD/esophageal stricture/gastric ulcer/history of colon polyps Protonix 40 mg twice daily Carafate Follows with GI outpatient Dr. Henrene Pastor  CKD 3  Baseline creatinine appears to be 1.2 with GFR 57 Creatinine 1.27 from 1.29 from 1.36 Continue to avoid nephrotoxic agents Monitor urine output Repeat BMP in the morning  Mild hypovolemic hyponatremia Sodium 130 from 132 Continue close monitoring  Chronic normocytic anemia Post 1 unit PRBC transfusion on 07/17/2018 Hemoglobin stable 8.4 from 7.9 yesterday On aspirin and Eliquis No sign of overt bleeding  Paroxysmal A. fib Rate controlled Continue cardiac medications Continue Eliquis  Post recent right hip surgery Pain management in place as needed Physical therapy recommend home health PT Follow-up with your orthopedic surgeon within 4 weeks with new x-rays  Treated HCAP, POA Completed course of IV antibiotics IV vancomycin and IV cefepime   Code Status:Full   Consultants:Cardiology/vascular surgery/orthopedic surgery/GI  Procedures:Angiogram and superior mesenteric artery and celiac artery stenting  Antimicrobials: Completed course of cefepime  Vancomycin 07/12/2018-07/14/2018    Discharge Exam: BP 113/64 (BP Location: Right Arm)    Pulse 74    Temp 98.2 F (36.8 C) (Oral)    Resp 10    Ht 5\' 5"  (1.651 m)    Wt 60.7 kg    SpO2 99%    BMI 22.28 kg/m   General: 73  y.o. year-old male well-developed well-nourished in no acute distress.  Alert and oriented x3.  Cardiovascular: Regular rate and rhythm with no rubs or gallops.  No JVD or thyromegaly noted.  Respiratory: Clear to auscultation with no wheezes or rales.  Good inspiratory effort.    Abdomen: Soft nontender nondistended with normal bowel sounds x4 quadrants.  Musculoskeletal: 1+ pitting edema in lower extremities bilaterally.  Psychiatry: Mood is appropriate for condition and setting.  Discharge Instructions You were cared for by a hospitalist during your hospital stay. If you have any questions about your discharge medications or the care you received while you were in the hospital after you are discharged, you can call the unit and asked to speak with the hospitalist on call if the hospitalist that took care of you is not available. Once you are discharged, your primary care physician will handle any further medical issues. Please note that NO REFILLS for any discharge medications will be authorized once you are discharged, as it is imperative that you return to your primary care physician (or establish a relationship with a primary care physician if you do not have one) for your aftercare needs so that they can reassess your need for medications and monitor your lab values.  Discharge Instructions    Amb Referral to Cardiac Rehabilitation   Complete by:  As directed    Diagnosis:  NSTEMI  After initial evaluation and assessments completed: Virtual Based Care may be provided alone or in conjunction with Phase 2 Cardiac Rehab based on patient barriers.:  Yes     Allergies as of 07/20/2018      Reactions   Codeine Nausea And Vomiting   Sulfa Antibiotics Rash   Sulfonamide Derivatives Rash      Medication List    STOP taking these medications   butalbital-aspirin-caffeine 50-325-40 MG capsule Commonly known as:  FIORINAL     TAKE these medications   ALAWAY OP Place 1 drop into the  right eye daily.   aspirin 81 MG EC tablet Take 1 tablet (81 mg total) by mouth daily.   diltiazem 30 MG tablet Commonly known as:  Cardizem Take one tablet by mouth every 4 hours AS NEEDED for A-fib HR > 100 as long as BP > 100.   dutasteride 0.5 MG capsule Commonly known as:  AVODART Take 0.5 mg by mouth daily.   Eliquis 5 MG Tabs tablet Generic drug:  apixaban TAKE 1 TABLET BY MOUTH TWICE A DAY What changed:  how much to take   esomeprazole 40 MG capsule Commonly known as:  NEXIUM Take 1 capsule (40 mg total) by mouth 2 (two) times daily.   ezetimibe 10 MG tablet Commonly known as:  ZETIA TAKE 1 TABLET (10 MG TOTAL) BY MOUTH DAILY. What changed:  See the new instructions.   loratadine 10 MG tablet Commonly known as:  CLARITIN Take 10 mg by mouth daily as needed for allergies, rhinitis or itching.   lovastatin 40 MG tablet Commonly known as:  MEVACOR Take 20 mg by mouth at bedtime.   metoprolol tartrate 100 MG tablet Commonly known as:  LOPRESSOR Take 1 tablet (100 mg total) by mouth 2 (two) times daily. What changed:    medication strength  how much to take   multivitamins ther. w/minerals Tabs tablet Take 1 tablet by mouth daily.   nitroGLYCERIN 0.4 MG/SPRAY spray Commonly known as:  NITROLINGUAL Place 1 spray under the tongue every 5 (five) minutes x 3 doses as needed for chest pain.   Ranexa 1000 MG SR tablet Generic drug:  ranolazine Take 500 mg by mouth 2 (two) times daily.   sucralfate 1 GM/10ML suspension Commonly known as:  CARAFATE Take 10 mLs (1 g total) by mouth 4 (four) times daily -  with meals and at bedtime.   torsemide 20 MG tablet Commonly known as:  DEMADEX Take 20 mg by mouth as needed (for fluid). FOR SWELLING OR WEIGHT GAIN   vitamin E 400 UNIT capsule Take 400 Units by mouth daily.      Allergies  Allergen Reactions   Codeine Nausea And Vomiting   Sulfa Antibiotics Rash   Sulfonamide Derivatives Rash   Follow-up  Information    Almyra Deforest, PA Follow up.   Specialties:  Cardiology, Radiology Why:  You have a virtual follow-up visit schedule for 08/02/2018 at 9:00am with Almyra Deforest, one of [name] PAs.  Contact information: [address] [place] [address] [phone]        Elam Dutch, MD In 4 weeks.   Specialties:  Vascular Surgery, Cardiology Why:  Office will call you to arrange your appt (sent) Contact information: [place] [address] [place], [facility] Follow up.   Specialty:  Home Health Services Why:  They will continue to do your home health care  at your home- Orders for RN/PT/OT/aide Contact information: [place] [address] [phone]            The results of significant diagnostics from this hospitalization (including imaging, microbiology, ancillary and laboratory) are listed below for reference.    Significant Diagnostic Studies: Dg Abd Portable 2v  Result Date: 07/13/2018 CLINICAL DATA:  Centralized abdominal pain for the past 5 days. EXAM: PORTABLE ABDOMEN - 2 VIEW COMPARISON:  01/19/2010 FINDINGS: Large stool burden within the cecum and ascending colon. This finding is associated with marked distension of the colon with the cecum measuring approximately they are. Mild gaseous distention of the upstream small bowel with index loop of small bowel within the mid hemiabdomen measuring approximately 3 cm. No pneumoperitoneum, pneumatosis or portal venous gas. Limited visualization of the lower thorax demonstrates sequela prior median sternotomy. Mild scoliotic curvature of the thoracolumbar spine with associated moderate multilevel lumbar spine DDD, incompletely evaluated. Post right femoral neck dynamic screw fixation, incompletely evaluated. IMPRESSION: Large colonic stool burden with findings most suggestive of adynamic ileus though conceivably a distal colonic obstruction could  result in a similar appearance. Clinical correlation is advised. Electronically Signed   By: Sandi Mariscal M.D.   On: 07/13/2018 09:30   Ct Angio Abd/pel W/ And/or W/o  Result Date: 07/13/2018 : Alerts CLINICAL DATA:  Chest pain, shortness of breath, abdominal pain x2 days, weight loss EXAM: CTA ABDOMEN AND PELVIS WITH CONTRAST TECHNIQUE: Multidetector CT imaging of the abdomen and pelvis was performed using the standard protocol during bolus administration of intravenous contrast. Multiplanar reconstructed images and MIPs were obtained and reviewed to evaluate the vascular anatomy. CONTRAST:  184mL OMNIPAQUE IOHEXOL 350 MG/ML SOLN COMPARISON:  07/12/2018, 08/07/2009 FINDINGS: VASCULAR Coronary calcifications. Aorta: Tortuous. Moderate scattered calcified atheromatous plaque. Minimal eccentric nonocclusive mural thrombus in the suprarenal segment. No aneurysm, dissection, or stenosis. Celiac: Short-segment origin stenosis of probable hemodynamic significance, ectatic distally. SMA: Partially calcified ostial plaque/thrombus extending over length of approximately 1.7 cm, resulting in short segment relatively high-grade stenosis. Distally there is a tandem lesion of eccentric partially calcified plaque without high-grade stenosis. Classic distal branch anatomy. Renals: Patent right renal ostial stent with a degree of in stent restenosis of indeterminate hemodynamic severity, patent distally. Duplicated left renal arteries, superior dominant and widely patent. There is calcified plaque at the origin of the more diminutive inferior left renal artery resulting in stenosis of indeterminate hemodynamic significance. IMA: Short-segment origin occlusion or high-grade stenosis, patent distally reconstituted by visceral collaterals. Inflow: Scattered calcified plaque throughout the iliac arterial systems. Linear intraluminal filling defect in the distal right external iliac artery possibly short dissection flap of  indeterminate hemodynamic significance. Origin stenosis of the right internal iliac artery with fusiform dilatation poststenotic dilatation up to 1.3 cm diameter. Proximal Outflow: Atheromatous common femoral arteries. Origin occlusion of bilateral SFA. Deep femoral branches patent. Veins: Patent hepatic veins, portal vein, SMV, splenic vein, bilateral renal veins, IVC and iliac venous system. No venous pathology identified. Review of the MIP images confirms the above findings. NON-VASCULAR Lower chest: Small pleural effusions right greater than left. Atelectasis/consolidation in the lung bases right greater than left. Previous median sternotomy. Hepatobiliary: Periportal edema. Gallbladder physiologically distended. No discrete liver lesion. No biliary ductal dilatation. Pancreas: Unremarkable. No pancreatic ductal dilatation or surrounding inflammatory changes. Spleen: Normal in size without focal abnormality. Adrenals/Urinary Tract: Normal adrenals. Unremarkable kidneys. No hydronephrosis. Urinary bladder incompletely distended, moderately thick-walled. Stomach/Bowel: Stomach is nondistended. Small bowel is nondilated.  Moderate colonic fecal material without dilatation. There is circumferential wall thickening suspected in the descending and sigmoid colon with incomplete luminal distention. No definite adjacent inflammatory/edematous change. Lymphatic: No abdominal or pelvic adenopathy. Reproductive: Prostate is unremarkable. Other: Trace pelvic and perihepatic ascites. No free air. Musculoskeletal: Spondylitic changes throughout the lumbar spine. Sliding screw and IM rod fixation across a comminuted right femoral intertrochanteric fracture. No acute fracture or worrisome bone lesion. IMPRESSION: VASCULAR 1. Significant origin stenoses of celiac axis, SMA, and IMA; resultant increased risk of occlusive mesenteric ischemia. 2. Short-segment dissection flap in the distal right external iliac artery, of  indeterminate hemodynamic significance. Correlate with clinical symptomatology and ABIs. 3. Patent right renal artery ostial stent with a degree of in-stent stenosis of indeterminate hemodynamic significance. 4. Stenosis of the inferior left renal artery of indeterminate hemodynamic significance. 5. Proximal stenosis of the right internal iliac artery, with 1.3 cm poststenotic dilatation. NON-VASCULAR:. 1. Circumferential wall thickening in descending and sigmoid colon consistent with colitis, possibly ischemic given the vascular findings. 2. Small bilateral pleural effusions with bibasilar atelectasis/consolidation. 3. Small amount of abdominal ascites. 4. Thick-walled urinary bladder suggesting a degree of bladder outlet obstruction. 5. Postop and degenerative changes as above. Electronically Signed   By: Lucrezia Europe M.D.   On: 07/13/2018 17:09    Microbiology: Recent Results (from the past 240 hour(s))  Culture, blood (Routine X 2) w Reflex to ID Panel     Status: None   Collection Time: 07/12/18 11:36 PM  Result Value Ref Range Status   Specimen Description BLOOD RIGHT ARM  Final   Special Requests   Final    BOTTLES DRAWN AEROBIC ONLY Blood Culture adequate volume   Culture   Final    NO GROWTH 5 DAYS Performed at [facility], [address] [place], [place] [address]    Report Status 07/17/2018 FINAL  Final  Culture, blood (Routine X 2) w Reflex to ID Panel     Status: None   Collection Time: 07/12/18 11:38 PM  Result Value Ref Range Status   Specimen Description BLOOD RIGHT HAND  Final   Special Requests   Final    BOTTLES DRAWN AEROBIC ONLY Blood Culture adequate volume   Culture   Final    NO GROWTH 5 DAYS Performed at [facility], [place] [address], [place], [place] [address]    Report Status 07/17/2018 FINAL  Final  SARS Coronavirus 2 (CEPHEID- Performed in [facility] hospital lab), Hosp Order     Status: None   Collection Time: 07/13/18  9:42 AM  Result Value Ref  Range Status   SARS Coronavirus 2 NEGATIVE NEGATIVE Final    Comment: (NOTE) If result is NEGATIVE SARS-CoV-2 target nucleic acids are NOT DETECTED. The SARS-CoV-2 RNA is generally detectable in upper and lower  respiratory specimens during the acute phase of infection. The lowest  concentration of SARS-CoV-2 viral copies this assay can detect is 250  copies / mL. A negative result does not preclude SARS-CoV-2 infection  and should not be used as the sole basis for treatment or other  patient management decisions.  A negative result may occur with  improper specimen collection / handling, submission of specimen other  than nasopharyngeal swab, presence of viral mutation(s) within the  areas targeted by this assay, and inadequate number of viral copies  (<250 copies / mL). A negative result must be combined with clinical  observations, patient history, and epidemiological information. If result is  POSITIVE SARS-CoV-2 target nucleic acids are DETECTED. The SARS-CoV-2 RNA is generally detectable in upper and lower  respiratory specimens dur ing the acute phase of infection.  Positive  results are indicative of active infection with SARS-CoV-2.  Clinical  correlation with patient history and other diagnostic information is  necessary to determine patient infection status.  Positive results do  not rule out bacterial infection or co-infection with other viruses. If result is PRESUMPTIVE POSTIVE SARS-CoV-2 nucleic acids MAY BE PRESENT.   A presumptive positive result was obtained on the submitted specimen  and confirmed on repeat testing.  While 2019 novel coronavirus  (SARS-CoV-2) nucleic acids may be present in the submitted sample  additional confirmatory testing may be necessary for epidemiological  and / or clinical management purposes  to differentiate between  SARS-CoV-2 and other Sarbecovirus currently known to infect humans.  If clinically indicated additional testing with an  alternate test  methodology [phone]) is advised. The SARS-CoV-2 RNA is generally  detectable in upper and lower respiratory sp ecimens during the acute  phase of infection. The expected result is Negative. Fact Sheet for Patients:  StrictlyIdeas.no Fact Sheet for Healthcare Providers: BankingDealers.co.za This test is not yet approved or cleared by the [place] FDA and has been authorized for detection and/or diagnosis of SARS-CoV-2 by FDA under an Emergency Use Authorization (EUA).  This EUA will remain in effect (meaning this test can be used) for the duration of the COVID-19 declaration under Section 564(b)(1) of the Act, 21 U.S.C. section 360bbb-3(b)(1), unless the authorization is terminated or revoked sooner. Performed at [facility], [place] [address], [place], [place] [address]      Labs: Basic Metabolic Panel: Recent Labs  Lab 07/14/18 [phone] 07/15/18 0519 07/16/18 0207 07/17/18 0326 07/18/18 0326 07/19/18 0246 07/20/18 0758  NA 130* 131* 133* 133* 132* 130* 130*  K 3.1* 3.8 4.0 4.4 4.3 4.3 4.3  CL 99 100 99 105 99 99 100  CO2 20* 20* 21* 21* 23 23 24   GLUCOSE 102* 124* 124* 111* 133* 123* 131*  BUN 30* 32* 36* 29* 27* 25* 24*  CREATININE 1.24 1.31* 1.37* 1.38* 1.36* 1.29* 1.27*  CALCIUM 8.0* 8.1* 8.5* 8.4* 8.6* 8.3* 8.4*  MG 1.8 1.7  --  1.9 1.9  --   --    Liver Function Tests: Recent Labs  Lab 07/14/18 0337 07/17/18 0326 07/20/18 0758  AST 46* 21 15  ALT 23 20 14   ALKPHOS 52 60 83  BILITOT 0.9 0.7 0.9  PROT 5.2* 5.2* 5.5*  ALBUMIN 2.4* 2.5* 2.8*   No results for input(s): LIPASE, AMYLASE in the last 168 hours. No results for input(s): AMMONIA in the last 168 hours. CBC: Recent Labs  Lab 07/14/18 0337  07/16/18 0207 07/17/18 0326 07/18/18 0326 07/19/18 0246 07/20/18 0758  WBC 13.4*   < > 7.7 8.0 9.0 6.5 7.9  NEUTROABS 11.6*  --   --   --  7.0  --  5.9  HGB 7.5*   < > 7.1* 6.6* 7.9*  7.9* 8.4*  HCT 21.3*   < > 20.9* 19.6* 22.7* 23.0* 25.3*  MCV 94.7   < > 94.6 96.1 92.3 93.1 95.1  PLT 208   < > 224 187 178 182 202   < > = values in this interval not displayed.   Cardiac Enzymes: Recent Labs  Lab 07/13/18 1338 07/13/18 2005  TROPONINI 4.90* 4.00*   BNP: BNP (last 3 results) No results for input(s): BNP  in the last 8760 hours.  ProBNP (last 3 results) No results for input(s): PROBNP in the last 8760 hours.  CBG: No results for input(s): GLUCAP in the last 168 hours.     Signed:  Kayleen Memos, MD [facility] 07/20/2018, 10:18 AM

## 2018-07-20 NOTE — Progress Notes (Signed)
Initial Nutrition Assessment  DOCUMENTATION CODES:   Not applicable  INTERVENTION:    Boost Breeze po BID, each supplement provides 250 kcal and 9 grams of protein  MVI daily  Ensure Enlive po BID, each supplement provides 350 kcal and 20 grams of protein once advanced.   NUTRITION DIAGNOSIS:   Increased nutrient needs related to acute illness as evidenced by estimated needs.  GOAL:   Patient will meet greater than or equal to 90% of their needs  MONITOR:   PO intake, Supplement acceptance, Diet advancement, Skin, Weight trends, Labs, I & O's  REASON FOR ASSESSMENT:   NPO/Clear Liquid Diet    ASSESSMENT:   Patient with PMH significant for CAD s/p CABG x 2, MI s/p cardiac cath, PVD, bilateral carotid stenosis, renal artery stenosis s/p stenting, recent right hip fracture 5/2 s/p repair, and GERD. Presents this admission with NSTEMI and lobar PNA.    Pt originally schedule to discharge yesterday. He began having postprandial burning chest pain and discharge was canceled.   RD spoke with pt at bedside. He denies having loss of appetite PTA. States he typically eats 2-3 meals daily that consist of banana sandwiches, meats, vegetables, and grains. He drink Ensure almost daily. Pt was tolerating heart healthy diet until he had reflux like symptoms. His diet was downgraded to clear liquids today. Upon assessment pt had consumed 3 packs of saltines and water without complication. RD to provide supplement to maximize protein.   Pt endorses a UBW of 135 lb and states he has always been of smaller frame. Records indicate pt has maintained his weight of 125-135 lb over the last year. Nutrition-Focused physical exam completed. Pt shows to have significant muscle depletion. Suspect this is related to immobility and advanced age vs poor intake.   I/O: -3,460 ml since admit UOP: 275 ml x 24 hrs   Drips: D10 with 40 mEq KCl @ 50 ml/hr Medications: miralax, senokot Labs: Na 130 (L)    NUTRITION - FOCUSED PHYSICAL EXAM:    Most Recent Value  Orbital Region  No depletion  Upper Arm Region  Mild depletion  Thoracic and Lumbar Region  Unable to assess  Buccal Region  No depletion  Temple Region  Moderate depletion  Clavicle Bone Region  Moderate depletion  Clavicle and Acromion Bone Region  Moderate depletion  Scapular Bone Region  Unable to assess  Dorsal Hand  Moderate depletion  Patellar Region  Moderate depletion  Anterior Thigh Region  Moderate depletion  Posterior Calf Region  Moderate depletion  Edema (RD Assessment)  Moderate [BLE]  Hair  Reviewed  Eyes  Reviewed  Mouth  Reviewed  Skin  Reviewed  Nails  Reviewed     Diet Order:   Diet Order            Diet clear liquid Room service appropriate? Yes; Fluid consistency: Thin  Diet effective midnight              EDUCATION NEEDS:   Education needs have been addressed  Skin:  Skin Assessment: Skin Integrity Issues: Skin Integrity Issues:: Incisions Incisions: right/left groin  Last BM:  5/14  Height:   Ht Readings from Last 1 Encounters:  07/12/18 5\' 5"  (1.651 m)    Weight:   Wt Readings from Last 1 Encounters:  07/20/18 60.7 kg    Ideal Body Weight:  61.8 kg  BMI:  Body mass index is 22.28 kg/m.  Estimated Nutritional Needs:   Kcal:  1800-2000 kcal  Protein:  80-95 grams  Fluid:  >/= 1.8 L/day   Mariana Single RD, LDN Clinical Nutrition Pager # - [phone]'

## 2018-07-20 NOTE — Progress Notes (Signed)
Physical Therapy Treatment Patient Details Name: [name] MRN: [id number] DOB: 21-Oct-1945 Today's Date: 07/20/2018    History of Present Illness 73 year old male with history of coronary artery disease status post CABG x2 in 1984 and 1996, subsequent MI with multiple PCI's, peripheral vascular disease status post CEA in 1906 and renal artery stenting in 2005, paroxysmal A. fib on Eliquis, GERD and recent right hip fracture status post repair at [facility] and subsequent discharged on 07/07/2018 was transferred from [facility] for fever, cough and shortness of breath.  COVID-19 testing was negative.  Troponin was 2.70.  CT angiogram of the chest showed no PE but areas of mild mucus impaction in the right lower lobe along with severe stenosis of the SMA origin.  Cardiology was consulted and patient was transferred to [facility] and started on heparin drip.  He was also started on broad-spectrum antibiotics.  He was also found to have severe SMA stenosis.  Vascular surgery was consulted.  Status post angiography and celiac artery stenting and superior mesenteric artery stenting on 07/16/2018.    PT Comments    Pt finally feeling well enough to participate.  Pt states he's had no reflux or nausea.  Emphasis on transitions, transfers and progressing gait stability/stamina.    Follow Up Recommendations  Home health PT     Equipment Recommendations  None recommended by PT    Recommendations for Other Services       Precautions / Restrictions Precautions Precautions: Fall Restrictions RLE Weight Bearing: Weight bearing as tolerated    Mobility  Bed Mobility Overal bed mobility: Needs Assistance Bed Mobility: Supine to Sit     Supine to sit: Supervision        Transfers Overall transfer level: Needs assistance Equipment used: Rolling walker (2 wheeled) Transfers: Sit to/from Stand Sit to Stand: Supervision         General transfer comment: slower, but  safe  Ambulation/Gait Ambulation/Gait assistance: Supervision Gait Distance (Feet): 130 Feet Assistive device: Rolling walker (2 wheeled) Gait Pattern/deviations: Step-to pattern;Step-through pattern     General Gait Details: slower, steady, but with moderate use of hands.  VSS throughout   Stairs             Wheelchair Mobility    Modified Rankin (Stroke Patients Only)       Balance Overall balance assessment: Mild deficits observed, not formally tested                                          Cognition Arousal/Alertness: Awake/alert Behavior During Therapy: WFL for tasks assessed/performed Overall Cognitive Status: Within Functional Limits for tasks assessed                                        Exercises      General Comments General comments (skin integrity, edema, etc.): VSS throughout.  Pt mildly dyspneic, but able to converse through ambulation in the hall      Pertinent Vitals/Pain Pain Assessment: Faces Faces Pain Scale: Hurts little more Pain Location: right foot Pain Descriptors / Indicators: Sore Pain Intervention(s): Monitored during session    Home Living Family/patient expects to be discharged to:: Private residence  Prior Function            PT Goals (current goals can now be found in the care plan section) Acute Rehab PT Goals PT Goal Formulation: With patient Time For Goal Achievement: 07/31/18 Potential to Achieve Goals: Good Progress towards PT goals: Progressing toward goals    Frequency    Min 3X/week      PT Plan Current plan remains appropriate    Co-evaluation              AM-PAC PT "6 Clicks" Mobility   Outcome Measure  Help needed turning from your back to your side while in a flat bed without using bedrails?: None Help needed moving from lying on your back to sitting on the side of a flat bed without using bedrails?: None Help needed moving  to and from a bed to a chair (including a wheelchair)?: None Help needed standing up from a chair using your arms (e.g., wheelchair or bedside chair)?: None Help needed to walk in hospital room?: None Help needed climbing 3-5 steps with a railing? : A Little 6 Click Score: 23    End of Session   Activity Tolerance: Patient tolerated treatment well Patient left: in chair;with call bell/phone within reach Nurse Communication: Mobility status PT Visit Diagnosis: Other abnormalities of gait and mobility (R26.89);Difficulty in walking, not elsewhere classified (R26.2)     Time: 1438-8875 PT Time Calculation (min) (ACUTE ONLY): 20 min  Charges:  $Gait Training: 8-22 mins                     07/20/2018  Donnella Sham, PT Acute Rehabilitation Services [phone]  (pager) [phone]  (office)   [name] 07/20/2018, 5:37 PM

## 2018-07-20 NOTE — Care Management Important Message (Signed)
Important Message  Patient Details  Name: [name] MRN: [id number] Date of Birth: Jul 03, 1945   Medicare Important Message Given:  Yes    [name] Montine Circle 07/20/2018, 2:16 PM

## 2018-07-21 DIAGNOSIS — R1310 Dysphagia, unspecified: Secondary | ICD-10-CM

## 2018-07-21 DIAGNOSIS — R0789 Other chest pain: Secondary | ICD-10-CM

## 2018-07-21 DIAGNOSIS — K219 Gastro-esophageal reflux disease without esophagitis: Secondary | ICD-10-CM

## 2018-07-21 MED ORDER — LIDOCAINE 5 % EX PTCH
1.0000 | MEDICATED_PATCH | CUTANEOUS | Status: DC
  Administered 2018-07-21: 09:00:00 1 via TRANSDERMAL
  Filled 2018-07-21: qty 1

## 2018-07-21 MED ORDER — LIDOCAINE 5 % EX PTCH: 1.0000 | MEDICATED_PATCH | CUTANEOUS | 0 refills | Status: DC

## 2018-07-21 MED ORDER — SUCRALFATE 1 GM/10ML PO SUSP: 1.0000 g | Freq: Every day | ORAL | Status: DC | PRN

## 2018-07-21 MED ORDER — PANTOPRAZOLE SODIUM 40 MG PO TBEC: 40.0000 mg | DELAYED_RELEASE_TABLET | Freq: Two times a day (BID) | ORAL | 0 refills | Status: DC

## 2018-07-21 MED ORDER — SUCRALFATE 1 GM/10ML PO SUSP: 1.0000 g | Freq: Every day | ORAL | 0 refills | Status: DC | PRN

## 2018-07-21 NOTE — Discharge Summary (Signed)
Progress Note  [name] OAC:[id number] DOB: Apr 27, 1945  PCP: Garwin Brothers, MD  Admit date: 07/12/2018 Discharge date: 07/21/2018  Recommendations for Outpatient Follow-up:  1. Follow-up with your cardiologist 2. Follow-up with vascular surgery 3. Follow-up with orthopedic surgery 4. Follow-up with your primary care provider 5. Follow-up with GI 6. Continue physical therapy 7. Fall precautions 8. Take your medications as prescribed  Discharge Diagnoses:  Active Hospital Problems   Diagnosis Date Noted   NSTEMI (non-ST elevated myocardial infarction) ([place]) 07/12/2018   Mesenteric ischemia (HCC)    Fever 07/12/2018   Lobar pneumonia ([place]) 07/12/2018   Paroxysmal atrial fibrillation ([place])    Hyperlipidemia LDL goal <70 01/16/2014   Carotid disease, bilateral ([place]) 03/06/2013   PVD (peripheral vascular disease) ([place]) 01/26/2011   Coronary artery disease 01/24/2011   Essential hypertension 04/24/2007   Hx of CABG '84, '96, last PCI 2012 04/24/2007   GASTROESOPHAGEAL REFLUX DISEASE 04/24/2007    Resolved Hospital Problems  No resolved problems to display.    Discharge Condition: Stable  Diet recommendation: Resume previous diet.  Heart healthy diet.  Vitals:   07/20/18 2352 07/21/18 0309  BP: 107/64 112/69  Pulse: 67 65  Resp: 15 20  Temp: 97.8 F (36.6 C) 97.9 F (36.6 C)  SpO2: 99% 99%    History of present illness:  73 year old male with history of coronary artery disease status post CABG x2 in 1984 and 1996, subsequent MI with multiple PCI's, peripheral vascular disease status post CEA in 1906 and renal artery stenting in 2005, paroxysmal A. fib on Eliquis, GERD and recent right hip fracture status post repair at [facility] and subsequent discharged on 07/07/2018 was transferred from [facility] for fever, cough and shortness of breath. COVID-19 testing was negative. Troponin was 2.70. CT angiogram of the chest showed no PE but areas of  mild mucus impaction in the right lower lobe along with severe stenosis of the SMA origin. Cardiology was consulted and patient was transferred to [facility] and started on heparin drip. Vascular surgery was consulted.  Post SMA celiac stent on 07/16/2018.  His anticoagulant was held due to drop in hemoglobin from 7.1 to 6.6.  Was transfused 1 unit PRBC on 07/17/18.  Resumed Eliquis on 07/18/2018.  Hemoglobin stable at 7.9 on 07/19/2018 from 7.9 on 07/18/2018.  No sign of overt bleeding.  Hospital course complicated by postprandial chest burning pain for which GI was consulted.  Patient was started on Carafate as needed and Protonix 40 mg twice daily.  Tolerated regimen well with resolution of symptoms.  No plan for endoscopy at this time per GI.  07/21/18: Patient seen and examined at his bedside.  No acute events overnight.  He denies any chest pain or postprandial burning chest pain.  He has no new complaints and tolerating a diet.  On the day of discharge, the patient was hemodynamically stable.  He will need to follow-up with his primary care provider, cardiology, vascular surgery, orthopedic surgery, and GI posthospitalization.  He would also need to continue physical therapy at home.  Patient understands and agrees to plan.    Hospital Course:  Principal Problem:   NSTEMI (non-ST elevated myocardial infarction) [facility]) Active Problems:   Essential hypertension   Hx of CABG '84, '96, last PCI 2012   GASTROESOPHAGEAL REFLUX DISEASE   PVD (peripheral vascular disease) ([place])   Carotid disease, bilateral ([place])   Hyperlipidemia LDL goal <70   Paroxysmal atrial fibrillation (HCC)   Coronary artery  disease   Fever   Lobar pneumonia ([place])   Mesenteric ischemia (HCC)  NSTEMI Troponin peaked at 5.16 and trended down Cardiology followed and signed off on 07/17/2018 Continue your cardiac medications as prescribed Follow-up with cardiology outpatient  Postprandial burning chest pain,  likely esophageal in origin GI followed.  No plan for endoscopy at this time. 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 Henrene Pastor of GI   CKD 3  Baseline creatinine appears to be 1.2 with GFR 57 Creatinine at baseline Avoid nephrotoxic agents Follow-up with your primary care provider  Mild hypovolemic hyponatremia Sodium 130 on 07/20/2018 Asymptomatic  Chronic normocytic anemia Post 1 unit PRBC transfusion on 07/17/2018 Hemoglobin stable 8.4 on 07/20/2018 On aspirin and Eliquis No sign of overt bleeding  Paroxysmal A. fib Rate controlled Continue cardiac medications Continue Eliquis  Post recent right hip surgery Pain management in place Lidocaine patch as needed Patient states he was told by cardiology not to take oxycodone because of interference with his cardiac medications Continue home health physical therapy Follow-up with your orthopedic surgeon within 4 weeks with new x-rays.  Treated HCAP, POA Completed course of IV antibiotics IV vancomycin and IV cefepime   Code Status:Full   Consultants:Cardiology/vascular surgery/orthopedic surgery/GI  Procedures:Angiogram and superior mesenteric artery and celiac artery  stenting  Antimicrobials: Completed course of cefepime  Vancomycin 07/12/2018-07/14/2018    Discharge Exam: BP 112/69 (BP Location: Right Arm)    Pulse 65    Temp 97.9 F (36.6 C) (Tympanic)    Resp 20    Ht 5\' 5"  (1.651 m)    Wt 61.1 kg    SpO2 99%    BMI 22.42 kg/m   General: 73 y.o. year-old male well-developed well-nourished in no acute distress.  Alert and oriented x3.    Cardiovascular: Regular rate and rhythm with no rubs or gallops.  No JVD or thyromegaly noted.  Respiratory: Clear to auscultation with no wheezes or rales. Good inspiratory effort.    Abdomen: Soft nontender nondistended with normal bowel sounds times 4 quadrants.  Musculoskeletal: Improving 1+ pitting edema in lower extremities bilaterally.  Psychiatry: Mood is appropriate for condition and setting.  Discharge Instructions You were cared for by a hospitalist during your hospital stay. If you have any questions about your discharge medications or the care you received while you were in the hospital after you are discharged, you can call the unit and asked to speak with the hospitalist on call if the hospitalist that took care of you is not available. Once you are discharged, your primary care physician will handle any further medical issues. Please note that NO REFILLS for any discharge medications will be authorized once you are discharged, as it is imperative that you return to your primary care physician (or establish a relationship with a primary care physician if you do not have one) for your aftercare  needs so that they can reassess your need for medications and monitor your lab values.  Discharge Instructions    Amb Referral to Cardiac Rehabilitation   Complete by:  As directed    Diagnosis:  NSTEMI   After initial evaluation and assessments completed: Virtual Based Care may be provided alone or in conjunction with Phase 2 Cardiac Rehab based on patient barriers.:  Yes     Allergies as of 07/21/2018       Reactions   Codeine Nausea And Vomiting   Sulfa Antibiotics Rash   Sulfonamide Derivatives Rash      Medication List    STOP taking these medications   butalbital-aspirin-caffeine 50-325-40 MG capsule Commonly known as:  FIORINAL   esomeprazole 40 MG capsule Commonly known as:  NEXIUM Replaced by:  pantoprazole 40 MG tablet     TAKE these medications   ALAWAY OP Place 1 drop into the right eye daily.   aspirin 81 MG EC tablet Take 1 tablet (81 mg total) by mouth daily.   diltiazem 30 MG tablet Commonly known as:  Cardizem Take one tablet by mouth every 4 hours AS NEEDED for A-fib HR > 100 as long as BP > 100.   dutasteride 0.5 MG capsule Commonly known as:  AVODART Take 0.5 mg by mouth daily.   Eliquis 5 MG Tabs tablet Generic drug:  apixaban TAKE 1 TABLET BY MOUTH TWICE A DAY What changed:  how much to take   ezetimibe 10 MG tablet Commonly known as:  ZETIA TAKE 1 TABLET (10 MG TOTAL) BY MOUTH DAILY. What changed:  See the new instructions.   lidocaine 5 % Commonly known as:  LIDODERM Place 1 patch onto the skin daily. Remove & Discard patch within 12 hours or as directed by MD   loratadine 10 MG tablet Commonly known as:  CLARITIN Take 10 mg by mouth daily as needed for allergies, rhinitis or itching.   lovastatin 40 MG tablet Commonly known as:  MEVACOR Take 20 mg by mouth at bedtime.   metoprolol tartrate 100 MG tablet Commonly known as:  LOPRESSOR Take 1 tablet (100 mg total) by mouth 2 (two) times daily. What changed:    medication strength  how much to take   multivitamins ther. w/minerals Tabs tablet Take 1 tablet by mouth daily.   nitroGLYCERIN 0.4 MG/SPRAY spray Commonly known as:  NITROLINGUAL Place 1 spray under the tongue every 5 (five) minutes x 3 doses as needed for chest pain.   pantoprazole 40 MG tablet Commonly known as:  PROTONIX Take 1 tablet (40 mg total) by mouth 2 (two) times daily. Replaces:  esomeprazole 40 MG capsule    Ranexa 1000 MG SR tablet Generic drug:  ranolazine Take 500 mg by mouth 2 (two) times daily.   sucralfate 1 GM/10ML suspension Commonly known as:  CARAFATE Take 10 mLs (1 g total) by mouth 4 (four) times daily -  with meals and at bedtime.   sucralfate 1 GM/10ML suspension Commonly known as:  CARAFATE Take 10 mLs (1 g total) by mouth daily as needed (esophageal burning).   torsemide 20 MG tablet Commonly known as:  DEMADEX Take 20 mg by mouth as needed (for fluid). FOR SWELLING OR WEIGHT GAIN   vitamin E 400 UNIT capsule Take 400 Units by mouth daily.      Allergies  Allergen Reactions   Codeine Nausea And Vomiting   Sulfa Antibiotics Rash   Sulfonamide Derivatives Rash   Follow-up Information  Almyra Deforest, [place] Follow up.   Specialties:  Cardiology, Radiology Why:  You have a virtual follow-up visit schedule for 08/02/2018 at 9:00am with Almyra Deforest, one of [name] PAs.  Contact information: [address] [place] [address] [phone]        Elam Dutch, MD In 4 weeks.   Specialties:  Vascular Surgery, Cardiology Why:  Office will call you to arrange your appt (sent) Contact information: [place] [address] [place], [facility] Follow up.   Specialty:  [place] Why:  They will continue to do your home health care at your home- Orders for RN/PT/OT/aide Contact information: [place] [address] [place] [address] [phone]        Garwin Brothers, MD. Call in 1 day(s).   Specialty:  Internal Medicine Why:  Please call for a post hospital follow-up appointment. Contact information: [place] [address] [phone]        Constance Haw, MD .   Specialty:  Cardiology Contact information: [address] [place] [address] [phone]        Troy Sine, MD .   Specialty:  Cardiology Contact information: [address] [place] [address] [phone]            The results of significant diagnostics from this hospitalization (including imaging, microbiology, ancillary and laboratory) are listed below for reference.    Significant Diagnostic Studies: Dg Abd Portable 2v  Result Date: 07/13/2018 CLINICAL DATA:  Centralized abdominal pain for the past 5 days. EXAM: PORTABLE ABDOMEN - 2 VIEW COMPARISON:  01/19/2010 FINDINGS: Large stool burden within the cecum and ascending colon. This finding is associated with marked distension of the colon with the cecum measuring approximately they are. Mild gaseous distention of the upstream small bowel with index loop of small bowel within the mid hemiabdomen measuring approximately 3 cm. No pneumoperitoneum, pneumatosis or portal venous gas. Limited visualization of the lower thorax demonstrates sequela prior median sternotomy. Mild scoliotic curvature of the thoracolumbar spine with associated moderate multilevel lumbar spine DDD, incompletely evaluated. Post right femoral neck dynamic screw fixation, incompletely evaluated. IMPRESSION: Large colonic stool burden with findings most suggestive of adynamic ileus though conceivably a distal colonic obstruction could result in a similar appearance. Clinical correlation is advised. Electronically Signed   By: Sandi Mariscal M.D.   On: 07/13/2018 09:30   Ct Angio Abd/pel W/ And/or W/o  Result Date: 07/13/2018 : Alerts CLINICAL DATA:  Chest pain, shortness of breath, abdominal pain x2 days, weight loss EXAM: CTA ABDOMEN AND PELVIS WITH CONTRAST TECHNIQUE: Multidetector CT imaging of the abdomen and pelvis was performed using the standard protocol during bolus administration of intravenous contrast. Multiplanar reconstructed images and MIPs were obtained and reviewed to evaluate the vascular anatomy. CONTRAST:  18mL OMNIPAQUE IOHEXOL 350 MG/ML SOLN COMPARISON:  07/12/2018, 08/07/2009 FINDINGS: VASCULAR Coronary  calcifications. Aorta: Tortuous. Moderate scattered calcified atheromatous plaque. Minimal eccentric nonocclusive mural thrombus in the suprarenal segment. No aneurysm, dissection, or stenosis. Celiac: Short-segment origin stenosis of probable hemodynamic significance, ectatic distally. SMA: Partially calcified ostial plaque/thrombus extending over length of approximately 1.7 cm, resulting in short segment relatively high-grade stenosis. Distally there is a tandem lesion of eccentric partially calcified plaque without high-grade stenosis. Classic distal branch anatomy. Renals: Patent right renal ostial stent with a degree of in stent restenosis of indeterminate hemodynamic  severity, patent distally. Duplicated left renal arteries, superior dominant and widely patent. There is calcified plaque at the origin of the more diminutive inferior left renal artery resulting in stenosis of indeterminate hemodynamic significance. IMA: Short-segment origin occlusion or high-grade stenosis, patent distally reconstituted by visceral collaterals. Inflow: Scattered calcified plaque throughout the iliac arterial systems. Linear intraluminal filling defect in the distal right external iliac artery possibly short dissection flap of indeterminate hemodynamic significance. Origin stenosis of the right internal iliac artery with fusiform dilatation poststenotic dilatation up to 1.3 cm diameter. Proximal Outflow: Atheromatous common femoral arteries. Origin occlusion of bilateral SFA. Deep femoral branches patent. Veins: Patent hepatic veins, portal vein, SMV, splenic vein, bilateral renal veins, IVC and iliac venous system. No venous pathology identified. Review of the MIP images confirms the above findings. NON-VASCULAR Lower chest: Small pleural effusions right greater than left. Atelectasis/consolidation in the lung bases right greater than left. Previous median sternotomy. Hepatobiliary: Periportal edema. Gallbladder physiologically  distended. No discrete liver lesion. No biliary ductal dilatation. Pancreas: Unremarkable. No pancreatic ductal dilatation or surrounding inflammatory changes. Spleen: Normal in size without focal abnormality. Adrenals/Urinary Tract: Normal adrenals. Unremarkable kidneys. No hydronephrosis. Urinary bladder incompletely distended, moderately thick-walled. Stomach/Bowel: Stomach is nondistended. Small bowel is nondilated. Moderate colonic fecal material without dilatation. There is circumferential wall thickening suspected in the descending and sigmoid colon with incomplete luminal distention. No definite adjacent inflammatory/edematous change. Lymphatic: No abdominal or pelvic adenopathy. Reproductive: Prostate is unremarkable. Other: Trace pelvic and perihepatic ascites. No free air. Musculoskeletal: Spondylitic changes throughout the lumbar spine. Sliding screw and IM rod fixation across a comminuted right femoral intertrochanteric fracture. No acute fracture or worrisome bone lesion. IMPRESSION: VASCULAR 1. Significant origin stenoses of celiac axis, SMA, and IMA; resultant increased risk of occlusive mesenteric ischemia. 2. Short-segment dissection flap in the distal right external iliac artery, of indeterminate hemodynamic significance. Correlate with clinical symptomatology and ABIs. 3. Patent right renal artery ostial stent with a degree of in-stent stenosis of indeterminate hemodynamic significance. 4. Stenosis of the inferior left renal artery of indeterminate hemodynamic significance. 5. Proximal stenosis of the right internal iliac artery, with 1.3 cm poststenotic dilatation. NON-VASCULAR:. 1. Circumferential wall thickening in descending and sigmoid colon consistent with colitis, possibly ischemic given the vascular findings. 2. Small bilateral pleural effusions with bibasilar atelectasis/consolidation. 3. Small amount of abdominal ascites. 4. Thick-walled urinary bladder suggesting a degree of bladder  outlet obstruction. 5. Postop and degenerative changes as above. Electronically Signed   By: Lucrezia Europe M.D.   On: 07/13/2018 17:09    Microbiology: Recent Results (from the past 240 hour(s))  Culture, blood (Routine X 2) w Reflex to ID Panel     Status: None   Collection Time: 07/12/18 11:36 PM  Result Value Ref Range Status   Specimen Description BLOOD RIGHT ARM  Final   Special Requests   Final    BOTTLES DRAWN AEROBIC ONLY Blood Culture adequate volume   Culture   Final    NO GROWTH 5 DAYS Performed at [facility], [address] [place], [place] [address]    Report Status 07/17/2018 FINAL  Final  Culture, blood (Routine X 2) w Reflex to ID Panel     Status: None   Collection Time: 07/12/18 11:38 PM  Result Value Ref Range Status   Specimen Description BLOOD RIGHT HAND  Final   Special Requests   Final    BOTTLES DRAWN AEROBIC ONLY Blood Culture adequate volume   Culture  Final    NO GROWTH 5 DAYS Performed at [facility], [place] [address], [place], [place] [address]    Report Status 07/17/2018 FINAL  Final  SARS Coronavirus 2 (CEPHEID- Performed in [place] hospital lab), Hosp Order     Status: None   Collection Time: 07/13/18  9:42 AM  Result Value Ref Range Status   SARS Coronavirus 2 NEGATIVE NEGATIVE Final    Comment: (NOTE) If result is NEGATIVE SARS-CoV-2 target nucleic acids are NOT DETECTED. The SARS-CoV-2 RNA is generally detectable in upper and lower  respiratory specimens during the acute phase of infection. The lowest  concentration of SARS-CoV-2 viral copies this assay can detect is 250  copies / mL. A negative result does not preclude SARS-CoV-2 infection  and should not be used as the sole basis for treatment or other  patient management decisions.  A negative result may occur with  improper specimen collection / handling, submission of specimen other  than nasopharyngeal swab, presence of viral mutation(s) within the  areas targeted by  this assay, and inadequate number of viral copies  (<250 copies / mL). A negative result must be combined with clinical  observations, patient history, and epidemiological information. If result is POSITIVE SARS-CoV-2 target nucleic acids are DETECTED. The SARS-CoV-2 RNA is generally detectable in upper and lower  respiratory specimens dur ing the acute phase of infection.  Positive  results are indicative of active infection with SARS-CoV-2.  Clinical  correlation with patient history and other diagnostic information is  necessary to determine patient infection status.  Positive results do  not rule out bacterial infection or co-infection with other viruses. If result is PRESUMPTIVE POSTIVE SARS-CoV-2 nucleic acids MAY BE PRESENT.   A presumptive positive result was obtained on the submitted specimen  and confirmed on repeat testing.  While 2019 novel coronavirus  (SARS-CoV-2) nucleic acids may be present in the submitted sample  additional confirmatory testing may be necessary for epidemiological  and / or clinical management purposes  to differentiate between  SARS-CoV-2 and other Sarbecovirus currently known to infect humans.  If clinically indicated additional testing with an alternate test  methodology [phone]) is advised. The SARS-CoV-2 RNA is generally  detectable in upper and lower respiratory sp ecimens during the acute  phase of infection. The expected result is Negative. Fact Sheet for Patients:  StrictlyIdeas.no Fact Sheet for Healthcare Providers: BankingDealers.co.za This test is not yet approved or cleared by the [place] FDA and has been authorized for detection and/or diagnosis of SARS-CoV-2 by FDA under an Emergency Use Authorization (EUA).  This EUA will remain in effect (meaning this test can be used) for the duration of the COVID-19 declaration under Section 564(b)(1) of the Act, 21 U.S.C. section  360bbb-3(b)(1), unless the authorization is terminated or revoked sooner. Performed at [facility], [place] [address] [place], [place] [address]      Labs: Basic Metabolic Panel: Recent Labs  Lab 07/15/18 0519 07/16/18 0207 07/17/18 0326 07/18/18 0326 07/19/18 0246 07/20/18 0758  NA 131* 133* 133* 132* 130* 130*  K 3.8 4.0 4.4 4.3 4.3 4.3  CL 100 99 105 99 99 100  CO2 20* 21* 21* 23 23 24   GLUCOSE 124* 124* 111* 133* 123* 131*  BUN 32* 36* 29* 27* 25* 24*  CREATININE 1.31* 1.37* 1.38* 1.36* 1.29* 1.27*  CALCIUM 8.1* 8.5* 8.4* 8.6* 8.3* 8.4*  MG 1.7  --  1.9 1.9  --   --  Liver Function Tests: Recent Labs  Lab 07/17/18 0326 07/20/18 0758  AST 21 15  ALT 20 14  ALKPHOS 60 83  BILITOT 0.7 0.9  PROT 5.2* 5.5*  ALBUMIN 2.5* 2.8*   No results for input(s): LIPASE, AMYLASE in the last 168 hours. No results for input(s): AMMONIA in the last 168 hours. CBC: Recent Labs  Lab 07/16/18 0207 07/17/18 0326 07/18/18 0326 07/19/18 0246 07/20/18 0758  WBC 7.7 8.0 9.0 6.5 7.9  NEUTROABS  --   --  7.0  --  5.9  HGB 7.1* 6.6* 7.9* 7.9* 8.4*  HCT 20.9* 19.6* 22.7* 23.0* 25.3*  MCV 94.6 96.1 92.3 93.1 95.1  PLT 224 187 178 182 202   Cardiac Enzymes: No results for input(s): CKTOTAL, CKMB, CKMBINDEX, TROPONINI in the last 168 hours. BNP: BNP (last 3 results) No results for input(s): BNP in the last 8760 hours.  ProBNP (last 3 results) No results for input(s): PROBNP in the last 8760 hours.  CBG: No results for input(s): GLUCAP in the last 168 hours.     Signed:  Kayleen Memos, MD [facility] 07/21/2018, 11:57 AM

## 2018-07-21 NOTE — Progress Notes (Signed)
     [facility] Progress Note  CC:  Chest pain  Subjective: No chest pain or abdominal pain. He passed a darker brown soft stool yesterday. No N/V. Tolerating a solid diet.   Objective:  Vital signs in last 24 hours: Temp:  [97.6 F (36.4 C)-98 F (36.7 C)] 97.9 F (36.6 C) (05/16 0309) Pulse Rate:  [62-76] 65 (05/16 0309) Resp:  [15-20] 20 (05/16 0309) BP: (101-125)/(55-77) 112/69 (05/16 0309) SpO2:  [99 %-100 %] 99 % (05/16 0309) Weight:  [61.1 kg] 61.1 kg (05/16 0500) Last BM Date: 07/20/18 General:   Alert,  Well-developed,    in NAD Heart: RRR, 2/6 systolic murmur. Mediastinal scar intact.  Pulm: Lungs clear throughout.  Abdomen:  Soft, nontender and nondistended. Normal bowel sounds, without guarding, and without rebound. RLQ bruit.   Extremities: 1+ pitting edema bilateral LEs. Neurologic:  Alert and  oriented x4;  grossly normal neurologically. Psych:  Alert and cooperative. Normal mood and affect.  Intake/Output from previous day: 05/15 0701 - 05/16 0700 In: 1155.2 [P.O.:240; I.V.:915.2] Out: 325 [Urine:325] Intake/Output this shift: No intake/output data recorded.  Lab Results: Recent Labs    07/19/18 0246 07/20/18 0758  WBC 6.5 7.9  HGB 7.9* 8.4*  HCT 23.0* 25.3*  PLT 182 202   BMET Recent Labs    07/19/18 0246 07/20/18 0758  NA 130* 130*  K 4.3 4.3  CL 99 100  CO2 23 24  GLUCOSE 123* 131*  BUN 25* 24*  CREATININE 1.29* 1.27*  CALCIUM 8.3* 8.4*   LFT Recent Labs    07/20/18 0758  PROT 5.5*  ALBUMIN 2.8*  AST 15  ALT 14  ALKPHOS 83  BILITOT 0.9    No results found.  Assessment / Plan:  1. 73 y.o. male with chest pain, hx GERD, gastric ulcer and esophageal stricture -continue Protonix 40mg  bid. -EGD deferred for now due to # 2,4 and 5  2.  Mesenteric ischemia. SMA stenosis. Extensive hx peripheral vascular disease.  Status post SMA and celiac stent placement 07/16/2018. Ischemic colitis per CT scan.  Abdominal pain  resolved.  3. Normocytic Anemia. Transfused 1 unit PRBCs. 5/15 hg 8.4. Labs not yet done today. -CBC now  4. Non STEMI 07/12/2018.  Likely type II in association with anemia related demand ischemia.  Peak troponin 5.16.  Cardiology has no plans for further evaluation. Previous CABGs and PCIs  5. Persistent atrial fibrillation, rate controlled.  Chads vascular score 4.  Eliquis restarted 5/13, low dose ASA never stopped.  Last Eliquis was 5/14 in evening.  6. Hyponatremia. Na 5/15 130.  -CMP  7. AKI. Cr 1.27 -CMP     LOS: 9 days   Noralyn Pick  07/21/2018, 9:28 AM

## 2018-07-22 ENCOUNTER — Telehealth: Payer: Self-pay | Admitting: Vascular Surgery

## 2018-07-22 NOTE — Telephone Encounter (Signed)
Pt called about bilateral leg swelling.  He takes lasix PRN but also feels dry with concentrated urine.  I told him not unusual to have swollen legs after hospital stay with malnutrition and IV fluid  Elevate legs today work on hydration.  Can take lasix tomorrow if worsening symptoms.  Ruta Hinds, MD [facility] Office: [phone] Pager: [phone]

## 2018-07-23 ENCOUNTER — Encounter (HOSPITAL_COMMUNITY): Payer: Self-pay

## 2018-07-23 ENCOUNTER — Inpatient Hospital Stay (HOSPITAL_COMMUNITY)
Admission: EM | Admit: 2018-07-23 | Discharge: 2018-07-29 | DRG: 381 | Disposition: A | Discharge status: Home / Self Care | Payer: Medicare Other | Attending: Internal Medicine | Admitting: Internal Medicine

## 2018-07-23 ENCOUNTER — Other Ambulatory Visit: Payer: Self-pay

## 2018-07-23 ENCOUNTER — Emergency Department (HOSPITAL_COMMUNITY): Payer: Medicare Other

## 2018-07-23 ENCOUNTER — Telehealth: Payer: Self-pay | Admitting: Internal Medicine

## 2018-07-23 DIAGNOSIS — E785 Hyperlipidemia, unspecified: Secondary | ICD-10-CM | POA: Diagnosis present

## 2018-07-23 DIAGNOSIS — K219 Gastro-esophageal reflux disease without esophagitis: Secondary | ICD-10-CM | POA: Diagnosis present

## 2018-07-23 DIAGNOSIS — R12 Heartburn: Secondary | ICD-10-CM | POA: Diagnosis not present

## 2018-07-23 DIAGNOSIS — Z885 Allergy status to narcotic agent status: Secondary | ICD-10-CM

## 2018-07-23 DIAGNOSIS — K222 Esophageal obstruction: Secondary | ICD-10-CM | POA: Diagnosis not present

## 2018-07-23 DIAGNOSIS — R112 Nausea with vomiting, unspecified: Secondary | ICD-10-CM

## 2018-07-23 DIAGNOSIS — Z20828 Contact with and (suspected) exposure to other viral communicable diseases: Secondary | ICD-10-CM | POA: Diagnosis present

## 2018-07-23 DIAGNOSIS — K2210 Ulcer of esophagus without bleeding: Secondary | ICD-10-CM

## 2018-07-23 DIAGNOSIS — I10 Essential (primary) hypertension: Secondary | ICD-10-CM | POA: Diagnosis present

## 2018-07-23 DIAGNOSIS — D638 Anemia in other chronic diseases classified elsewhere: Secondary | ICD-10-CM | POA: Diagnosis present

## 2018-07-23 DIAGNOSIS — I5032 Chronic diastolic (congestive) heart failure: Secondary | ICD-10-CM | POA: Diagnosis present

## 2018-07-23 DIAGNOSIS — Z7982 Long term (current) use of aspirin: Secondary | ICD-10-CM

## 2018-07-23 DIAGNOSIS — R1310 Dysphagia, unspecified: Secondary | ICD-10-CM | POA: Diagnosis not present

## 2018-07-23 DIAGNOSIS — E869 Volume depletion, unspecified: Secondary | ICD-10-CM | POA: Diagnosis present

## 2018-07-23 DIAGNOSIS — I724 Aneurysm of artery of lower extremity: Secondary | ICD-10-CM | POA: Diagnosis present

## 2018-07-23 DIAGNOSIS — N183 Chronic kidney disease, stage 3 (moderate): Secondary | ICD-10-CM | POA: Diagnosis present

## 2018-07-23 DIAGNOSIS — G8929 Other chronic pain: Secondary | ICD-10-CM | POA: Diagnosis present

## 2018-07-23 DIAGNOSIS — E8770 Fluid overload, unspecified: Secondary | ICD-10-CM | POA: Insufficient documentation

## 2018-07-23 DIAGNOSIS — E440 Moderate protein-calorie malnutrition: Secondary | ICD-10-CM | POA: Diagnosis not present

## 2018-07-23 DIAGNOSIS — R609 Edema, unspecified: Secondary | ICD-10-CM

## 2018-07-23 DIAGNOSIS — N189 Chronic kidney disease, unspecified: Secondary | ICD-10-CM | POA: Diagnosis not present

## 2018-07-23 DIAGNOSIS — I5030 Unspecified diastolic (congestive) heart failure: Secondary | ICD-10-CM | POA: Diagnosis present

## 2018-07-23 DIAGNOSIS — Z955 Presence of coronary angioplasty implant and graft: Secondary | ICD-10-CM

## 2018-07-23 DIAGNOSIS — I25119 Atherosclerotic heart disease of native coronary artery with unspecified angina pectoris: Secondary | ICD-10-CM | POA: Diagnosis not present

## 2018-07-23 DIAGNOSIS — I739 Peripheral vascular disease, unspecified: Secondary | ICD-10-CM | POA: Diagnosis present

## 2018-07-23 DIAGNOSIS — I272 Pulmonary hypertension, unspecified: Secondary | ICD-10-CM | POA: Diagnosis present

## 2018-07-23 DIAGNOSIS — E871 Hypo-osmolality and hyponatremia: Secondary | ICD-10-CM | POA: Diagnosis not present

## 2018-07-23 DIAGNOSIS — I4821 Permanent atrial fibrillation: Secondary | ICD-10-CM | POA: Diagnosis not present

## 2018-07-23 DIAGNOSIS — Z951 Presence of aortocoronary bypass graft: Secondary | ICD-10-CM

## 2018-07-23 DIAGNOSIS — Z7901 Long term (current) use of anticoagulants: Secondary | ICD-10-CM

## 2018-07-23 DIAGNOSIS — D631 Anemia in chronic kidney disease: Secondary | ICD-10-CM | POA: Diagnosis not present

## 2018-07-23 DIAGNOSIS — Z8249 Family history of ischemic heart disease and other diseases of the circulatory system: Secondary | ICD-10-CM

## 2018-07-23 DIAGNOSIS — I130 Hypertensive heart and chronic kidney disease with heart failure and stage 1 through stage 4 chronic kidney disease, or unspecified chronic kidney disease: Secondary | ICD-10-CM | POA: Diagnosis present

## 2018-07-23 DIAGNOSIS — M549 Dorsalgia, unspecified: Secondary | ICD-10-CM | POA: Diagnosis present

## 2018-07-23 DIAGNOSIS — K551 Chronic vascular disorders of intestine: Secondary | ICD-10-CM | POA: Diagnosis not present

## 2018-07-23 DIAGNOSIS — I878 Other specified disorders of veins: Secondary | ICD-10-CM | POA: Diagnosis present

## 2018-07-23 DIAGNOSIS — I2510 Atherosclerotic heart disease of native coronary artery without angina pectoris: Secondary | ICD-10-CM | POA: Diagnosis present

## 2018-07-23 DIAGNOSIS — I252 Old myocardial infarction: Secondary | ICD-10-CM

## 2018-07-23 DIAGNOSIS — D649 Anemia, unspecified: Secondary | ICD-10-CM | POA: Diagnosis present

## 2018-07-23 DIAGNOSIS — I509 Heart failure, unspecified: Secondary | ICD-10-CM

## 2018-07-23 DIAGNOSIS — Z882 Allergy status to sulfonamides status: Secondary | ICD-10-CM

## 2018-07-23 DIAGNOSIS — K449 Diaphragmatic hernia without obstruction or gangrene: Secondary | ICD-10-CM | POA: Diagnosis not present

## 2018-07-23 DIAGNOSIS — Z803 Family history of malignant neoplasm of breast: Secondary | ICD-10-CM

## 2018-07-23 DIAGNOSIS — K311 Adult hypertrophic pyloric stenosis: Principal | ICD-10-CM | POA: Diagnosis present

## 2018-07-23 DIAGNOSIS — Z79899 Other long term (current) drug therapy: Secondary | ICD-10-CM

## 2018-07-23 DIAGNOSIS — Z87891 Personal history of nicotine dependence: Secondary | ICD-10-CM

## 2018-07-23 DIAGNOSIS — I480 Paroxysmal atrial fibrillation: Secondary | ICD-10-CM | POA: Diagnosis not present

## 2018-07-23 DIAGNOSIS — R5383 Other fatigue: Secondary | ICD-10-CM

## 2018-07-23 HISTORY — DX: Unspecified diastolic (congestive) heart failure: I50.30

## 2018-07-23 LAB — CBC WITH DIFFERENTIAL/PLATELET
Abs Immature Granulocytes: 0.09 10*3/uL — ABNORMAL HIGH (ref 0.00–0.07)
Basophils Absolute: 0 10*3/uL (ref 0.0–0.1)
Basophils Relative: 0 %
Eosinophils Absolute: 0 10*3/uL (ref 0.0–0.5)
Eosinophils Relative: 0 %
HCT: 25.5 % — ABNORMAL LOW (ref 39.0–52.0)
Hemoglobin: 8.3 g/dL — ABNORMAL LOW (ref 13.0–17.0)
Immature Granulocytes: 1 %
Lymphocytes Relative: 4 %
Lymphs Abs: 0.4 10*3/uL — ABNORMAL LOW (ref 0.7–4.0)
MCH: 32.3 pg (ref 26.0–34.0)
MCHC: 32.5 g/dL (ref 30.0–36.0)
MCV: 99.2 fL (ref 80.0–100.0)
Monocytes Absolute: 0.7 10*3/uL (ref 0.1–1.0)
Monocytes Relative: 7 %
Neutro Abs: 8.3 10*3/uL — ABNORMAL HIGH (ref 1.7–7.7)
Neutrophils Relative %: 88 %
Platelets: 223 10*3/uL (ref 150–400)
RBC: 2.57 MIL/uL — ABNORMAL LOW (ref 4.22–5.81)
RDW: 17 % — ABNORMAL HIGH (ref 11.5–15.5)
WBC: 9.5 10*3/uL (ref 4.0–10.5)
nRBC: 0 % (ref 0.0–0.2)

## 2018-07-23 LAB — COMPREHENSIVE METABOLIC PANEL
ALT: 12 U/L (ref 0–44)
AST: 15 U/L (ref 15–41)
Albumin: 2.7 g/dL — ABNORMAL LOW (ref 3.5–5.0)
Alkaline Phosphatase: 82 U/L (ref 38–126)
Anion gap: 10 (ref 5–15)
BUN: 20 mg/dL (ref 8–23)
CO2: 23 mmol/L (ref 22–32)
Calcium: 8.4 mg/dL — ABNORMAL LOW (ref 8.9–10.3)
Chloride: 98 mmol/L (ref 98–111)
Creatinine, Ser: 1.05 mg/dL (ref 0.61–1.24)
GFR calc Af Amer: >60 mL/min (ref >60)
GFR calc non Af Amer: >60 mL/min (ref >60)
Glucose, Bld: 116 mg/dL — ABNORMAL HIGH (ref 70–99)
Potassium: 3.7 mmol/L (ref 3.5–5.1)
Sodium: 131 mmol/L — ABNORMAL LOW (ref 135–145)
Total Bilirubin: 1.2 mg/dL (ref 0.3–1.2)
Total Protein: 5.3 g/dL — ABNORMAL LOW (ref 6.5–8.1)

## 2018-07-23 LAB — SARS CORONAVIRUS 2 BY RT PCR (HOSPITAL ORDER, PERFORMED IN ~~LOC~~ HOSPITAL LAB): SARS Coronavirus 2: NEGATIVE

## 2018-07-23 LAB — MAGNESIUM: Magnesium: 1.8 mg/dL (ref 1.7–2.4)

## 2018-07-23 LAB — TROPONIN I: Troponin I: 0.04 ng/mL (ref <0.03)

## 2018-07-23 LAB — BRAIN NATRIURETIC PEPTIDE: B Natriuretic Peptide: 1122.4 pg/mL — ABNORMAL HIGH (ref 0.0–100.0)

## 2018-07-23 MED ORDER — LIDOCAINE VISCOUS HCL 2 % MT SOLN
15.0000 mL | Freq: Once | OROMUCOSAL | Status: AC
  Administered 2018-07-23: 15 mL via ORAL
  Filled 2018-07-23: qty 15

## 2018-07-23 MED ORDER — ALUM & MAG HYDROXIDE-SIMETH 200-200-20 MG/5ML PO SUSP
30.0000 mL | Freq: Once | ORAL | Status: AC
  Administered 2018-07-23: 30 mL via ORAL
  Filled 2018-07-23: qty 30

## 2018-07-23 MED ORDER — ONDANSETRON HCL 4 MG/2ML IJ SOLN
4.0000 mg | Freq: Once | INTRAMUSCULAR | Status: AC
  Administered 2018-07-23: 4 mg via INTRAVENOUS
  Filled 2018-07-23: qty 2

## 2018-07-23 NOTE — Telephone Encounter (Signed)
Patient's wife reports vomiting and severe GERD.  Recently hospitalized with several med changes.  Carafate does not help, just exacerbates the vomiting.  Wife is instructed to have him hold fluids for about 2 hours then allow him ice chips, progress to water, etc.  He is rescheduled for OV via telephone tomorrow with Dr. Henrene Pastor

## 2018-07-23 NOTE — ED Triage Notes (Signed)
Pt from home with PTAR with c.o generalized weakness, nausea and vomiting x5 today. Pt recently d.c on Saturday, had stents placed in his abd. Per wife pt has been more lethargic today. Pt arrives to ED alert and oriented. VSS

## 2018-07-23 NOTE — ED Notes (Signed)
Pt given water, will try again for urine sample.

## 2018-07-23 NOTE — Telephone Encounter (Signed)
Pt's wife [name] called to inform that pt was d/c from hospital last week, he was put on carafate and was doing fine until yesterday when he began having acid reflux sxs again, he feels acid coming back up and today has not been able to keep anything down. Mrs. He is scheduled for a f/u with Dr. Henrene Pastor on 6/23 but [name] would like some advise on what to do in the meantime. Pls call her.

## 2018-07-23 NOTE — ED Provider Notes (Signed)
[facility] EMERGENCY DEPARTMENT Provider Note   CSN: [id number] Arrival date & time: 07/23/18  2001    History   Chief Complaint Chief Complaint  Patient presents with  . Emesis  . Nausea  . Weakness    HPI [name] is a 73 y.o. male.     The history is provided by the patient and medical records. No language interpreter was used.  Emesis  Severity:  Severe Duration:  1 day Timing:  Constant Quality:  Stomach contents Progression:  Unchanged Chronicity:  New Recent urination:  Decreased Context: not post-tussive   Relieved by:  Nothing Worsened by:  Nothing Ineffective treatments:  None tried Associated symptoms: no abdominal pain, no chills, no cough, no diarrhea, no fever, no headaches, no sore throat and no URI     Past Medical History:  Diagnosis Date  . Anemia   . Angina   . Atrial fibrillation ([place])   . Blood transfusion   . Bronchitis   . Bulging discs    "8 of them; thoracic, lumbar, sacral area"  . Chronic back pain   . Colon polyps   . Complication of anesthesia   . Coronary artery disease 01/24/11   Successful PCI long segmental stensois  vein graft to the CX marginal vessel-graft previously stented prox. 3.0x29mm TAXUS stent mid seg 3.0x67mm TAXUS stent now tandem stens of 3.0x54mm & 3.0x88mm placed with the seg. 50,80 & 99% stenosis reduced to 0%  . DDD (degenerative disc disease)   . Dysrhythmia    "PAC's and PVC's"  . Esophageal dilatation    "have had it done 7 times; last time ~ 2001"  . Esophageal stricture   . GERD (gastroesophageal reflux disease)   . Headache(784.0)   . Hiatal hernia   . Hyperlipidemia   . Myocardial infarction ([place]) 1991  . Myocardial infarction [facility]) 2005   "had 3 heart attacks this year"  . Paroxysmal atrial fibrillation (H[facility])   . Pyloric stenosis   . RBBB   . Renal artery stenosis (H[facility])    hhistory of right renal artery stenting and re-intervention for "in-stent restenosis.  . S/P  CABG (coronary artery bypass graft) [place]  . Shortness of breath    "when I had the heart attacks"  . Subclavian artery stenosis, left ([place])   . Ulcer    "small; Dr. Henrene Pastor found it 02/2010"    Patient Active Problem List   Diagnosis Date Noted  . Mesenteric ischemia ([place])   . NSTEMI (non-ST elevated myocardial infarction) ([place]) 07/12/2018  . Fever 07/12/2018  . Lobar pneumonia ([place]) 07/12/2018  . Diastolic CHF ([place]) 40/98/1191  . Chronic anticoagulation 04/19/2017  . Subclavian artery stenosis, left ([place])   . Dyspnea   . S/P CABG (coronary artery bypass graft)   . Renal artery stenosis ([place])   . RBBB   . Pyloric stenosis   . Paroxysmal atrial fibrillation (H[facility])   . Hyperlipidemia   . Hiatal hernia   . GERD (gastroesophageal reflux disease)   . Esophageal stricture   . Esophageal dilatation   . Dysrhythmia   . Complication of anesthesia   . Colon polyps   . Chronic back pain   . Bronchitis   . Atrial fibrillation ([place])   . Palpitations 06/16/2016  . PAF (paroxysmal atrial fibrillation) ([place])   . Change in bowel habits 02/03/2014  . CN (constipation) 02/03/2014  . Rectal bleeding 02/03/2014  .  Hyperlipidemia LDL goal <70 01/16/2014  . Carotid stenosis 12/10/2013  . Aftercare following surgery of the circulatory system 12/10/2013  . Carotid disease, bilateral ([place]) 03/06/2013  . Anemia 01/26/2011  . PVD (peripheral vascular disease) ([place]) 01/26/2011  . 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 Crescendo angina ([place]) 01/25/2011  . Presence of stent of bypass graft 01/25/2011  . Coronary artery disease 01/24/2011  . CONSTIPATION 01/25/2010  . NAUSEA 08/06/2009  . ABDOMINAL PAIN, GENERALIZED 08/06/2009  . ABDOMINAL PAIN-MULTIPLE SITES 08/06/2009  . INGUINAL HERNIA 03/12/2009  . FLATULENCE-GAS-BLOATING 03/12/2009  . ESOPHAGEAL STRICTURE 01/30/2008  . HYPERLIPIDEMIA 04/24/2007  .  Essential hypertension 04/24/2007  . Hx of CABG '84, '96, last PCI 2012 04/24/2007  . HEMORRHOIDS 04/24/2007  . EROSIVE ESOPHAGITIS 04/24/2007  . GASTROESOPHAGEAL REFLUX DISEASE 04/24/2007  . PEPTIC STRICTURE 04/24/2007  . Myocardial infarction ([place]) 03/07/1989    Past Surgical History:  Procedure Laterality Date  . ADENOIDECTOMY     "when I was an infant"  . APPENDECTOMY  1953  . CARDIOVERSION N/A 04/17/2017   Procedure: CARDIOVERSION;  Surgeon: Jerline Pain, MD;  [facility];  Service: Cardiovascular;  Laterality: N/A;  . CAROTID ENDARTERECTOMY Right [place]  01/24/11   "today makes a total of 9 stents"  . CORONARY ARTERY BYPASS GRAFT  1984   CABG X 4  . CORONARY ARTERY BYPASS GRAFT  1996   CABG X 4  . LEFT HEART CATHETERIZATION WITH CORONARY/GRAFT ANGIOGRAM N/A 01/24/2011   Procedure: LEFT HEART CATHETERIZATION WITH Beatrix Fetters;  Surgeon: Troy Sine, MD;  Location[facility] C[facility] LAB;  Service: Cardiovascular;  Laterality: N/A;  . PERIPHERAL VASCULAR INTERVENTION  07/16/2018   Procedure: PERIPHERAL VASCULAR INTERVENTION;  Surgeon: Elam Dutch, MD;  Location: [facility] CV LAB;  Service: Cardiovascular;;  SMA and Celiac  . pylondial cyst removal    . RETINAL DETACHMENT SURGERY  04/2007   right  . TONSILLECTOMY  1972  . VISCERAL ANGIOGRAPHY N/A 07/16/2018   Procedure: VISCERAL ANGIOGRAPHY;  Surgeon: Elam Dutch, MD;  Location: [facility] CV LAB;  Service: Cardiovascular;  Laterality: N/A;        Home Medications    Prior to Admission medications   Medication Sig Start Date End Date Taking? Authorizing Provider  aspirin EC 81 MG EC tablet Take 1 tablet (81 mg total) by mouth daily. 07/20/18   Kayleen Memos, DO  diltiazem (CARDIZEM) 30 MG tablet Take one tablet by mouth every 4 hours AS NEEDED for A-fib HR > 100 as long as BP > 100. 06/30/17   Sherran Needs, NP  dutasteride (AVODART) 0.5 MG  capsule Take 0.5 mg by mouth daily.  03/22/14   [provider]  ELIQUIS 5 MG TABS tablet TAKE 1 TABLET BY MOUTH TWICE A DAY Patient taking differently: Take 5 mg by mouth 2 (two) times daily.  05/14/18   Camnitz, Will Hassell Done, MD  ezetimibe (ZETIA) 10 MG tablet TAKE 1 TABLET (10 MG TOTAL) BY MOUTH DAILY. Patient taking differently: Take 10 mg by mouth daily.  03/05/18   Troy Sine, MD  Ketotifen Fumarate (ALAWAY OP) Place 1 drop into the right eye daily.    [provider]  loratadine (CLARITIN) 10 MG tablet Take 10 mg by mouth daily as needed for allergies, rhinitis or itching.  12/12/13   [provider]  lovastatin (MEVACOR) 40 MG tablet Take  20 mg by mouth at bedtime.    [provider]  metoprolol tartrate (LOPRESSOR) 100 MG tablet Take 1 tablet (100 mg total) by mouth 2 (two) times daily. 07/19/18   Kayleen Memos, DO  Multiple Vitamins-Minerals (MULTIVITAMINS THER. W/MINERALS) TABS Take 1 tablet by mouth daily.      [provider]  nitroGLYCERIN (NITROLINGUAL) 0.4 MG/SPRAY spray Place 1 spray under the tongue every 5 (five) minutes x 3 doses as needed for chest pain. 01/01/18   [name], MD  pantoprazole (PROTONIX) 40 MG tablet Take 1 tablet (40 mg total) by mouth 2 (two) times daily. 07/21/18   Kayleen Memos, DO  ranolazine (RANEXA) 1000 MG SR tablet Take 500 mg by mouth 2 (two) times daily.    [provider]  sucralfate (CARAFATE) 1 GM/10ML suspension Take 10 mLs (1 g total) by mouth daily as needed (esophageal burning). 07/21/18   Kayleen Memos, DO  torsemide (DEMADEX) 20 MG tablet Take 20 mg by mouth as needed (for fluid). FOR SWELLING OR WEIGHT GAIN    [provider]  vitamin E 400 UNIT capsule Take 400 Units by mouth daily.     [provider]    Family History Family History  Problem Relation Age of Onset  . Breast cancer Paternal Aunt   . Diabetes Sister   . Ulcers Father   . Heart disease  Father   . Heart failure Father   . Heart attack Father   . AAA (abdominal aortic aneurysm) Father   . Stroke Mother   . Hypertension Mother   . Colon cancer Neg Hx     Social History Social History   Tobacco Use  . Smoking status: Former Smoker    Packs/day: 1.00    Years: 38.00    Pack years: 38.00    Types: Cigarettes    Last attempt to quit: 02/09/2005    Years since quitting: 13.4  . Smokeless tobacco: Never Used  . Tobacco comment: "quit smoking cigarrettes 2006"  Substance Use Topics  . Alcohol use: No    Alcohol/week: 0.0 standard drinks  . Drug use: No     Allergies   Codeine; Sulfa antibiotics; and Sulfonamide derivatives   Review of Systems Review of Systems  Constitutional: Positive for fatigue. Negative for chills, diaphoresis and fever.  HENT: Negative for congestion and sore throat.   Eyes: Negative for visual disturbance.  Respiratory: Negative for cough, chest tightness, shortness of breath, wheezing and stridor.   Cardiovascular: Positive for leg swelling. Negative for chest pain and palpitations.  Gastrointestinal: Positive for vomiting. Negative for abdominal pain, constipation, diarrhea and nausea.  Genitourinary: Positive for decreased urine volume. Negative for flank pain.  Musculoskeletal: Negative for back pain, neck pain and neck stiffness.  Skin: Negative for rash and wound.  Neurological: Negative for dizziness, weakness, light-headedness and headaches.  Psychiatric/Behavioral: Negative for agitation.  All other systems reviewed and are negative.    Physical Exam Updated Vital Signs BP 136/77 (BP Location: Right Arm)   Pulse 89   Temp 98.4 F (36.9 C) (Oral)   Resp 20   SpO2 100%   Physical Exam Vitals signs and nursing note reviewed.  Constitutional:      General: He is not in acute distress.    Appearance: He is well-developed. He is not ill-appearing, toxic-appearing or diaphoretic.  HENT:     Head: Normocephalic and  atraumatic.     Nose: No congestion or rhinorrhea.  Mouth/Throat:     Pharynx: No oropharyngeal exudate or posterior oropharyngeal erythema.  Eyes:     Conjunctiva/sclera: Conjunctivae normal.     Pupils: Pupils are equal, round, and reactive to light.  Neck:     Musculoskeletal: Neck supple. No muscular tenderness.  Cardiovascular:     Rate and Rhythm: Normal rate. Rhythm irregular.     Pulses: Normal pulses.     Heart sounds: No murmur.  Pulmonary:     Effort: Pulmonary effort is normal. No respiratory distress.     Breath sounds: No stridor. Rales present. No wheezing or rhonchi.  Chest:     Chest wall: No tenderness.  Abdominal:     General: Abdomen is flat. There is no distension.     Palpations: Abdomen is soft.     Tenderness: There is no abdominal tenderness.  Musculoskeletal:        General: No tenderness.     Right lower leg: Edema present.     Left lower leg: Edema present.  Skin:    General: Skin is warm and dry.     Capillary Refill: Capillary refill takes less than 2 seconds.  Neurological:     General: No focal deficit present.     Mental Status: He is alert.  Psychiatric:        Mood and Affect: Mood normal.      ED Treatments / Results  Labs (all labs ordered are listed, but only abnormal results are displayed) Labs Reviewed  CBC WITH DIFFERENTIAL/PLATELET - Abnormal; Notable for the following components:      Result Value   RBC 2.57 (*)    Hemoglobin 8.3 (*)    HCT 25.5 (*)    RDW 17.0 (*)    Neutro Abs 8.3 (*)    Lymphs Abs 0.4 (*)    Abs Immature Granulocytes 0.09 (*)    All other components within normal limits  COMPREHENSIVE METABOLIC PANEL - Abnormal; Notable for the following components:   Sodium 131 (*)    Glucose, Bld 116 (*)    Calcium 8.4 (*)    Total Protein 5.3 (*)    Albumin 2.7 (*)    All other components within normal limits  TROPONIN I - Abnormal; Notable for the following components:   Troponin I 0.04 (*)    All other  components within normal limits  BRAIN NATRIURETIC PEPTIDE - Abnormal; Notable for the following components:   B Natriuretic Peptide 1,122.4 (*)    All other components within normal limits  SARS CORONAVIRUS 2 (HOSPITAL ORDER, [facility])  URINE CULTURE  MAGNESIUM  URINALYSIS, ROUTINE W REFLEX MICROSCOPIC  LACTIC ACID, PLASMA  TROPONIN I  TROPONIN I    EKG EKG Interpretation  Date/Time:  Monday Jul 23 2018 20:15:41 EDT Ventricular Rate:  88 PR Interval:    QRS Duration: 147 QT Interval:  389 QTC Calculation: 471 R Axis:   105 Text Interpretation:  Atrial fibrillation Multiple ventricular premature complexes RBBB and LPFB Inferior infarct, age indeterminate When compared to prior, more PVC>  No STEMI Confirmed by Antony Blackbird [phone]) on 07/23/2018 8:45:13 PM   Radiology Dg Chest Portable 1 View  Result Date: 07/23/2018 CLINICAL DATA:  Weakness, fatigue, lower extremity swelling. EXAM: PORTABLE CHEST 1 VIEW COMPARISON:  CT abdomen pelvis dated Jul 13, 2018. CT chest and chest x-ray dated Jul 12, 2018. FINDINGS: The patient remains rotated to the right. Stable cardiomediastinal silhouette status post CABG. Normal pulmonary vascularity. Small  right pleural effusion with mild right basilar atelectasis, unchanged since prior CT abdomen from 07/13/2018. No consolidation or pneumothorax. No acute osseous abnormality. IMPRESSION: 1. Unchanged small right pleural effusion. Electronically Signed   By: Titus Dubin M.D.   On: 07/23/2018 21:08    Procedures Procedures (including critical care time)  Medications Ordered in ED Medications  alum & mag hydroxide-simeth (MAALOX/MYLANTA) 200-200-20 MG/5ML suspension 30 mL (30 mLs Oral Given 07/23/18 2103)    And  lidocaine (XYLOCAINE) 2 % viscous mouth solution 15 mL (15 mLs Oral Given 07/23/18 2103)  ondansetron (ZOFRAN) injection 4 mg (4 mg Intravenous Given 07/23/18 2047)  ondansetron (ZOFRAN) injection 4 mg (4 mg  Intravenous Given 07/23/18 2322)     Initial Impression / Assessment and Plan / ED Course  I have reviewed the triage vital signs and the nursing notes.  Pertinent labs & imaging results that were available during my care of the patient were reviewed by me and considered in my medical decision making (see chart for details).        NKOSI CORTRIGHT is a 73 y.o. male with a past medical history significant for hypertension, hyperlipidemia, CAD status post CABG, paroxysmal atrial fibrillation on Eliquis therapy, carotid disease, CHF, and recent mesenteric ischemia status post SMA stent placement who presents with nausea, vomiting, esophageal burning sensation, and severe fatigue.  Patient reports that for the last few days he has had worsening symptoms with nausea and vomiting today.  He has 4-5 nonbloody nonbilious episodes of emesis.  He reports a normal bowel movement earlier and still passing gas.  He denies any abdominal pain despite having SMA stenting performed recently.  He was discharged on Saturday, 2 days ago.  He reports that he has had less to eat and drink since he is so tired.  He reports his urine has decreased significantly in amount.  He reports his legs feel more swollen and he is feeling very fatigued.  He denies any fevers or chills, congestion, cough, shortness breath, or chest pain.  On exam, patient's lungs do have decreased breath sounds and rales in the base of both lungs.  No murmur was appreciated.  Chest and abdomen were nontender.  Legs were very edematous bilaterally with palpable pulses.  Back was nontender, no CVA tenderness.  Patient was not hypotensive or febrile initially.  He is maintaining his oxygen saturations on room air.  Clinically I am concerned about the patient's report that he is having nausea, vomiting, decrease in urination, and worsened edema.  Clinically he looks like he is having fluid overload or CHF exacerbation.  Will check labs and imaging.  Will  check cardiac enzymes.  Patient reports he recently had an STEMI and did not have chest pain during it just primarily fatigue nausea and vomiting.  Will check troponin.  EKG does not show STEMI.  Will look for occult infection given recent admission and discharge as well.  Given the patient's lack of any abdominal pain, or abdominal tenderness on exam, will hold on CT imaging of his abdomen at this time.  Anticipate reassessment after work-up.  11:13 PM Patient's laboratory testing began to return.  I am concerned about his BNP being over 1100 and a chest x-ray showing persistent pleural effusions.  Clinically he seems fluid up with his legs being very swollen and worsening per his report and the pulmonary edema on exam.  I am also concerned that the patient is slightly intravascularly dehydrated as he is still having  nausea and vomiting despite initial Zofran.  More Zofran will be given.  His white blood cell count is normal and his hemoglobin is similarly anemic.  His troponin is elevated but is improved from a week ago.  Coronavirus test negative.  Given the patient's worsening evidence of heart failure, recent discharged 2 days ago, intolerance of p.o. with continued vomiting, and severe fatigue I am concerned that all this is related to worsening heart failure.  I feel patient is to be admitted for cardiac ultrasound and safe diuresis.  Appears patient takes a torsemide for diuretic.  Will speak with hospitalist for diuretic management.  Patient will be admitted.    Final Clinical Impressions(s) / ED Diagnoses   Final diagnoses:  Fatigue, unspecified type  Intractable vomiting with nausea, unspecified vomiting type  Acute on chronic congestive heart failure, unspecified heart failure type [facility])  Peripheral edema    ED Discharge Orders    None      Clinical Impression: 1. Fatigue, unspecified type   2. Intractable vomiting with nausea, unspecified vomiting type   3. Acute on chronic  congestive heart failure, unspecified heart failure type ([place])   4. Peripheral edema     Disposition: Admit  This note was prepared with assistance of Dragon voice recognition software. Occasional wrong-word or sound-a-like substitutions may have occurred due to the inherent limitations of voice recognition software.     Tegeler, Gwenyth Allegra, MD 07/24/18 0003

## 2018-07-23 NOTE — H&P (Signed)
History and Physical    [name] FAO:[id number] DOB: [birth year] DOA: 07/23/2018  PCP: Garwin Brothers, MD  Patient coming from: Home  I have personally briefly reviewed patient's old medical records in [place]  Chief Complaint: Nausea, vomiting, swelling of legs  HPI: [name] is a 73 y.o. male with medical history significant for CAD s/p CABG x2 in 1984 and 1996, atrial fibrillation on Eliquis, chronic mesenteric ischemia s/p SMA and celiac stenting 07/16/18, PVD s/p renal artery stenting 2005 and Rt CEA 2005, CKD stage 2-3, GERD, PUD, peptic and esophageal strictures with previous esophageal dilatations, recent right hip fracture status post ORIF who presents to the hospital with severe heartburn, dysphagia, nausea, and occasional emesis.  Patient was recently admitted from 07/12/2018-07/21/2018 for mesenteric ischemia status post celiac and SMA stenting on 07/16/2018.  He was also noted to have an NSTEMI with peak troponin of 5.16.  He had severe heartburn and intractable nausea vomiting treated with Protonix, Carafate, and as needed IV Zofran.  He was noted to have acute on chronic anemia on admission and required transfusion of 1 unit PRBC which stabilized and he was advised to continue his home aspirin and Eliquis.  Patient states that since discharge from the hospital he has had significant heartburn and dysphagia with any oral intake including solids, liquids, and his medications.  Symptoms worsen anytime he takes anything by mouth.  He has a history of esophageal stenosis and he feels this is similar to previous episodes.  He feels as if he is having slow transit of his medications with occasional feeling that they get stuck near his chest.  He denies any anginal type chest pain but says his symptoms are different than when he had his previous heart attacks.  He denies any abdominal pain.  He reports decreased urine output which he attributes to his low oral intake.  He denies any  diarrhea or constipation.  He reports occasional dyspnea which he attributes to his atrial fibrillation.  He is on Eliquis and low-dose aspirin and denies any obvious bleeding.  He had recent right hip fracture and underwent ORIF at [facility] within the last month and says that he has been progressing well and ambulating with a walker.  He denies any recent falls or loss of consciousness.  He has noted increased swelling of both of his legs which is a chronic issue.  He says the swelling improves with elevation and with compression stockings.  He denies any fevers, chills, diaphoresis.  ED Course:  Initial vitals showed BP 158/73, pulse 86, RR 20, temp 98.4 Fahrenheit, SPO2 100% on room air.  Labs are notable for WBC 9.5, hemoglobin 8.3, platelets 223,000, sodium 131, BUN 20, creatinine 1.05, GFR >60, troponin I 0.04, BNP 1122.4 (no prior for comparison), magnesium 1.8.  SARS-CoV-2 test was negative.  Portable chest x-ray showed prior CABG changes and stable small right pleural effusion.  Due to significant nausea, vomiting, and dysphagia the hospital service was consulted for further evaluation and management.  Review of Systems: All systems reviewed and are negative except as documented in history of present illness above.   Past Medical History:  Diagnosis Date   Anemia    Angina    Atrial fibrillation (HCC)    Blood transfusion    Bronchitis    Bulging discs    "8 of them; thoracic, lumbar, sacral area"   Chronic back pain    Colon polyps    Complication of anesthesia  Coronary artery disease 01/24/11   Successful PCI long segmental stensois  vein graft to the CX marginal vessel-graft previously stented prox. 3.0x52mm TAXUS stent mid seg 3.0x60mm TAXUS stent now tandem stens of 3.0x98mm & 3.0x108mm placed with the seg. 50,80 & 99% stenosis reduced to 0%   DDD (degenerative disc disease)    Dysrhythmia    "PAC's and PVC's"   Esophageal dilatation    "have  had it done 7 times; last time ~ 2001"   Esophageal stricture    GERD (gastroesophageal reflux disease)    Headache(784.0)    Hiatal hernia    Hyperlipidemia    Myocardial infarction [facility]) 1991   Myocardial infarction ([place]) 2005   "had 3 heart attacks this year"   Paroxysmal atrial fibrillation ([place])    Pyloric stenosis    RBBB    Renal artery stenosis (HCC)    hhistory of right renal artery stenting and re-intervention for "in-stent restenosis.   S/P CABG (coronary artery bypass graft) [place]   Shortness of breath    "when I had the heart attacks"   Subclavian artery stenosis, left ([place])    Ulcer    "small; Dr. Henrene Pastor found it 02/2010"    Past Surgical History:  Procedure Laterality Date   ADENOIDECTOMY     "when I was an infant"   [place] N/A 04/17/2017   Procedure: CARDIOVERSION;  Surgeon: Jerline Pain, MD;  Location: [facility] ENDOSCOPY;  Service: Cardiovascular;  Laterality: N/A;   CAROTID ENDARTERECTOMY Right [place]  01/24/11   "today makes a total of 9 stents"   CORONARY ARTERY BYPASS GRAFT  1984   CABG X 4   CORONARY ARTERY BYPASS GRAFT  1996   CABG X 4   LEFT HEART CATHETERIZATION WITH CORONARY/GRAFT ANGIOGRAM N/A 01/24/2011   Procedure: LEFT HEART CATHETERIZATION WITH Beatrix Fetters;  Surgeon: Troy Sine, MD;  Location: [facility] CATH LAB;  Service: Cardiovascular;  Laterality: N/A;   PERIPHERAL VASCULAR INTERVENTION  07/16/2018   Procedure: PERIPHERAL VASCULAR INTERVENTION;  Surgeon: Elam Dutch, MD;  Location: [facility] CV LAB;  Service: Cardiovascular;;  SMA and Celiac   pylondial cyst removal     RETINAL DETACHMENT SURGERY  04/2007   right   TONSILLECTOMY  1972   VISCERAL ANGIOGRAPHY N/A 07/16/2018   Procedure: VISCERAL ANGIOGRAPHY;  Surgeon: Elam Dutch, MD;  Location: [place] CV LAB;  Service:  Cardiovascular;  Laterality: N/A;    Social History:  reports that he quit smoking about 13 years ago. His smoking use included cigarettes. He has a 38.00 pack-year smoking history. He has never used smokeless tobacco. He reports that he does not drink alcohol or use drugs.  Allergies  Allergen Reactions   Codeine Nausea And Vomiting   Sulfa Antibiotics Rash   Sulfonamide Derivatives Rash    Family History  Problem Relation Age of Onset   Breast cancer Paternal Aunt    Diabetes Sister    Ulcers Father    Heart disease Father    Heart failure Father    Heart attack Father    AAA (abdominal aortic aneurysm) Father    Stroke Mother    Hypertension Mother    Colon cancer Neg Hx      Prior to Admission medications   Medication Sig Start Date End Date Taking? Authorizing Provider  aspirin EC 81 MG  EC tablet Take 1 tablet (81 mg total) by mouth daily. 07/20/18   Kayleen Memos, DO  diltiazem (CARDIZEM) 30 MG tablet Take one tablet by mouth every 4 hours AS NEEDED for A-fib HR > 100 as long as BP > 100. 06/30/17   Sherran Needs, NP  dutasteride (AVODART) 0.5 MG capsule Take 0.5 mg by mouth daily.  03/22/14   [provider]  ELIQUIS 5 MG TABS tablet TAKE 1 TABLET BY MOUTH TWICE A DAY Patient taking differently: Take 5 mg by mouth 2 (two) times daily.  05/14/18   Camnitz, Will Hassell Done, MD  ezetimibe (ZETIA) 10 MG tablet TAKE 1 TABLET (10 MG TOTAL) BY MOUTH DAILY. Patient taking differently: Take 10 mg by mouth daily.  03/05/18   Troy Sine, MD  Ketotifen Fumarate (ALAWAY OP) Place 1 drop into the right eye daily.    [provider]  loratadine (CLARITIN) 10 MG tablet Take 10 mg by mouth daily as needed for allergies, rhinitis or itching.  12/12/13   [provider]  lovastatin (MEVACOR) 40 MG tablet Take 20 mg by mouth at bedtime.    [provider]  metoprolol tartrate (LOPRESSOR) 100 MG tablet Take 1 tablet (100 mg total) by mouth 2  (two) times daily. 07/19/18   Kayleen Memos, DO  Multiple Vitamins-Minerals (MULTIVITAMINS THER. W/MINERALS) TABS Take 1 tablet by mouth daily.      [provider]  nitroGLYCERIN (NITROLINGUAL) 0.4 MG/SPRAY spray Place 1 spray under the tongue every 5 (five) minutes x 3 doses as needed for chest pain. 01/01/18   [name], MD  pantoprazole (PROTONIX) 40 MG tablet Take 1 tablet (40 mg total) by mouth 2 (two) times daily. 07/21/18   Kayleen Memos, DO  ranolazine (RANEXA) 1000 MG SR tablet Take 500 mg by mouth 2 (two) times daily.    [provider]  sucralfate (CARAFATE) 1 GM/10ML suspension Take 10 mLs (1 g total) by mouth daily as needed (esophageal burning). 07/21/18   Kayleen Memos, DO  torsemide (DEMADEX) 20 MG tablet Take 20 mg by mouth as needed (for fluid). FOR SWELLING OR WEIGHT GAIN    [provider]  vitamin E 400 UNIT capsule Take 400 Units by mouth daily.     [provider]    Physical Exam: Vitals:   07/23/18 2103 07/23/18 2130 07/23/18 2200 07/23/18 2230  BP:  (!) 147/84 (!) 172/90 (!) 161/84  Pulse:  95 84 90  Resp:  20 15 (!) 21  Temp: 98.7 F (37.1 C)     TempSrc: Rectal     SpO2:  100% 98% 100%    Constitutional: Chronically ill-appearing man resting in bed with head at 30 degrees, NAD, calm, appears tired Eyes: PERRL, lids and conjunctivae normal ENMT: Mucous membranes are dry. Posterior pharynx clear of any exudate or lesions.Normal dentition.  Neck: normal, supple, no masses. Respiratory: clear to auscultation bilaterally, no wheezing, no crackles. Normal respiratory effort. No accessory muscle use.  Cardiovascular: Irregularly irregular, 2/6 systolic murmur present.  +2 pitting edema bilateral lower extremities, pedal pulses difficult to palpate by hand Abdomen: no tenderness, no masses palpated. No hepatosplenomegaly. Bowel sounds positive.  Musculoskeletal: no clubbing / cyanosis. No joint deformity upper and lower  extremities. Good ROM, no contractures. Normal muscle tone.  Skin: Right hip surgical scar healing well Neurologic: CN 2-12 grossly intact. Sensation intact, moving all extremities Psychiatric: Normal judgment and insight. Alert and oriented x 3.  Normal mood.    Labs on Admission: I have personally reviewed following labs and imaging studies  CBC: Recent Labs  Lab 07/17/18 0326 07/18/18 0326 07/19/18 0246 07/20/18 0758 07/23/18 2045  WBC 8.0 9.0 6.5 7.9 9.5  NEUTROABS  --  7.0  --  5.9 8.3*  HGB 6.6* 7.9* 7.9* 8.4* 8.3*  HCT 19.6* 22.7* 23.0* 25.3* 25.5*  MCV 96.1 92.3 93.1 95.1 99.2  PLT 187 178 182 202 175   Basic Metabolic Panel: Recent Labs  Lab 07/17/18 0326 07/18/18 0326 07/19/18 0246 07/20/18 0758 07/23/18 2045  NA 133* 132* 130* 130* 131*  K 4.4 4.3 4.3 4.3 3.7  CL 105 99 99 100 98  CO2 21* 23 23 24 23   GLUCOSE 111* 133* 123* 131* 116*  BUN 29* 27* 25* 24* 20  CREATININE 1.38* 1.36* 1.29* 1.27* 1.05  CALCIUM 8.4* 8.6* 8.3* 8.4* 8.4*  MG 1.9 1.9  --   --  1.8   GFR: Estimated Creatinine Clearance: 53.3 mL/min (by C-G formula based on SCr of 1.05 mg/dL). Liver Function Tests: Recent Labs  Lab 07/17/18 0326 07/20/18 0758 07/23/18 2045  AST 21 15 15   ALT 20 14 12   ALKPHOS 60 83 82  BILITOT 0.7 0.9 1.2  PROT 5.2* 5.5* 5.3*  ALBUMIN 2.5* 2.8* 2.7*   No results for input(s): LIPASE, AMYLASE in the last 168 hours. No results for input(s): AMMONIA in the last 168 hours. Coagulation Profile: No results for input(s): INR, PROTIME in the last 168 hours. Cardiac Enzymes: Recent Labs  Lab 07/23/18 2045  TROPONINI 0.04*   BNP (last 3 results) No results for input(s): PROBNP in the last 8760 hours. HbA1C: No results for input(s): HGBA1C in the last 72 hours. CBG: No results for input(s): GLUCAP in the last 168 hours. Lipid Profile: No results for input(s): CHOL, HDL, LDLCALC, TRIG, CHOLHDL, LDLDIRECT in the last 72 hours. Thyroid Function Tests: No  results for input(s): TSH, T4TOTAL, FREET4, T3FREE, THYROIDAB in the last 72 hours. Anemia Panel: No results for input(s): VITAMINB12, FOLATE, FERRITIN, TIBC, IRON, RETICCTPCT in the last 72 hours. Urine analysis:    Component Value Date/Time   COLORURINE DARK YELLOW 11/25/2014 1027   APPEARANCEUR CLEAR 11/25/2014 1027   LABSPEC 1.023 11/25/2014 1027   PHURINE 5.5 11/25/2014 1027   GLUCOSEU NEGATIVE 11/25/2014 1027   HGBUR NEGATIVE 11/25/2014 1027   BILIRUBINUR NEGATIVE 11/25/2014 1027   KETONESUR NEGATIVE 11/25/2014 1027   PROTEINUR NEGATIVE 11/25/2014 1027   NITRITE NEGATIVE 11/25/2014 1027   LEUKOCYTESUR NEGATIVE 11/25/2014 1027    Radiological Exams on Admission: Dg Chest Portable 1 View  Result Date: 07/23/2018 CLINICAL DATA:  Weakness, fatigue, lower extremity swelling. EXAM: PORTABLE CHEST 1 VIEW COMPARISON:  CT abdomen pelvis dated Jul 13, 2018. CT chest and chest x-ray dated Jul 12, 2018. FINDINGS: The patient remains rotated to the right. Stable cardiomediastinal silhouette status post CABG. Normal pulmonary vascularity. Small right pleural effusion with mild right basilar atelectasis, unchanged since prior CT abdomen from 07/13/2018. No consolidation or pneumothorax. No acute osseous abnormality. IMPRESSION: 1. Unchanged small right pleural effusion. Electronically Signed   By: Titus Dubin M.D.   On: 07/23/2018 21:08    EKG: Independently reviewed. Atrial fibrillation, rate 90 bpm, multiple PVCs, RBBB and LPFB.  PVCs new compared to prior otherwise no significant change.  Assessment/Plan Active Problems:   Essential hypertension   Hx of CABG '84, '96, last PCI 2012   Anemia   PVD (peripheral vascular disease) ([place])  Hyperlipidemia LDL goal <70   PAF (paroxysmal atrial fibrillation) (HCC)   Coronary artery disease   Diastolic CHF (HCC)   Chronic mesenteric ischemia (HCC)   Nausea and vomiting   Dysphagia  [name] is a 73 y.o. male with medical history  significant for CAD s/p CABG x2 in 1984 and 1996, atrial fibrillation on Eliquis, chronic mesenteric ischemia s/p SMA and celiac stenting 07/16/18, PVD s/p renal artery stenting 2005 and Rt CEA 2005, CKD stage 2-3, GERD, PUD, peptic and esophageal strictures with previous esophageal dilatations, recent right hip fracture status post ORIF who is admitted with severe heartburn and dysphagia associated with poor oral intake.   Dysphagia/GERD/esophageal stenosis/history of esophageal dilation x 7: Reports symptoms similar to prior episodes of esophageal stenosis with significant exacerbation of symptoms with any oral intake. -Start IV Protonix 40 mg twice daily for acid suppression -Try Carafate and is able to tolerate oral intake -SLP eval -Likely needs further GI evaluation and consultation in the morning  Chronic diastolic CHF: EF 53-97%, mild MR, mild TR, and mild pulmonary hypertension by TTE 08/24/2016.  He has edema of both legs suspect due to his chronic peripheral vascular disease rather than CHF as he otherwise denies symptoms of heart failure exacerbation.  Although BNP is elevated, he seems intravascularly volume depleted with recent poor oral intake and decreased urine output. -IV metoprolol for now, adjustasneeded and resume home oral metoprolol as able -Will hold home torsemide as he is likely intravascularly volume depleted -Monitor daily weights, strict I/O's.  Bladder scan and in and out cath as needed.  Peripheral vascular disease/chronic mesenteric ischemia: S/p SMA and Celiac stenting 07/16/18.  He denies any further abdominal pain.  Has significant venous stasis edema of both legs as above. -Continue aspirin 81 mg as able to tolerate orals -Place TED hose, elevate legs -Holding home lovastatin and Zetia for now  CAD status post CABG: Denies anginal type chest pain.  Had recent NSTEMI with peak troponin of 5.16.  Troponin on admission was 0.04.  EKG without acute ischemic  changes. -Cycle cardiac enzymes -Continue aspirin 81 mg daily, IV metoprolol as above  Atrial fibrillation: Remains in atrial fibrillation with controlled rate.  Is on Eliquis as an outpatient for anticoagulation, he has not taken it today.  He is on as needed diltiazem as an outpatient. -IV metoprolol, adjust as needed -IV heparin anticoagulation due to dysphagia  Anemia of chronic disease: Hemoglobin is currently stable at 8.3.  He denies any obvious bleeding. -Continue to monitor closely for signs/symptoms of bleeding while on anticoagulation  Hypertension: Initially hypertensive, improved on admission. -IV metoprolol as above -IV hydralazine as needed  Hyperlipidemia: -Holding home statin and Zetia for now with dysphagia  Right hip fracture status post ORIF: Reports progressing well and ambulating with use of a walker at home now. -PT eval  DVT prophylaxis: Heparin anticoagulation Code Status: Full code, confirmed with patient Family Communication: None present on admission Disposition Plan: Pending clinical progress Consults called: None Admission status: Inpatient, patient likely requires greater than 2 midnight length stay for evaluation and management of dysphagia, possible recurrent esophageal stenosis, and poor oral intake.  He is high risk for decompensation due to his comorbidities which include CAD, atrial fibrillation, severe peripheral vascular disease, and chronic mesenteric ischemia.   Zada Finders MD [facility]  If 7PM-7AM, please contact night-coverage [email or web address]  07/24/2018, 12:14 AM

## 2018-07-23 NOTE — ED Notes (Signed)
Pts wife Pamala Hurry; [phone]

## 2018-07-24 ENCOUNTER — Other Ambulatory Visit: Payer: Self-pay

## 2018-07-24 ENCOUNTER — Ambulatory Visit: Payer: Medicare Other | Admitting: Internal Medicine

## 2018-07-24 ENCOUNTER — Encounter (HOSPITAL_COMMUNITY): Payer: Self-pay | Admitting: General Practice

## 2018-07-24 DIAGNOSIS — R112 Nausea with vomiting, unspecified: Secondary | ICD-10-CM

## 2018-07-24 DIAGNOSIS — I10 Essential (primary) hypertension: Secondary | ICD-10-CM

## 2018-07-24 DIAGNOSIS — K551 Chronic vascular disorders of intestine: Secondary | ICD-10-CM

## 2018-07-24 DIAGNOSIS — I5032 Chronic diastolic (congestive) heart failure: Secondary | ICD-10-CM

## 2018-07-24 DIAGNOSIS — I480 Paroxysmal atrial fibrillation: Secondary | ICD-10-CM

## 2018-07-24 DIAGNOSIS — I25119 Atherosclerotic heart disease of native coronary artery with unspecified angina pectoris: Secondary | ICD-10-CM

## 2018-07-24 DIAGNOSIS — R1310 Dysphagia, unspecified: Secondary | ICD-10-CM

## 2018-07-24 DIAGNOSIS — N189 Chronic kidney disease, unspecified: Secondary | ICD-10-CM

## 2018-07-24 DIAGNOSIS — Z951 Presence of aortocoronary bypass graft: Secondary | ICD-10-CM

## 2018-07-24 DIAGNOSIS — D631 Anemia in chronic kidney disease: Secondary | ICD-10-CM

## 2018-07-24 LAB — BASIC METABOLIC PANEL
Anion gap: 9 (ref 5–15)
BUN: 20 mg/dL (ref 8–23)
CO2: 23 mmol/L (ref 22–32)
Calcium: 8.4 mg/dL — ABNORMAL LOW (ref 8.9–10.3)
Chloride: 97 mmol/L — ABNORMAL LOW (ref 98–111)
Creatinine, Ser: 1.15 mg/dL (ref 0.61–1.24)
GFR calc Af Amer: >60 mL/min (ref >60)
GFR calc non Af Amer: >60 mL/min (ref >60)
Glucose, Bld: 97 mg/dL (ref 70–99)
Potassium: 4.3 mmol/L (ref 3.5–5.1)
Sodium: 129 mmol/L — ABNORMAL LOW (ref 135–145)

## 2018-07-24 LAB — PREALBUMIN: Prealbumin: 19.2 mg/dL (ref 18–38)

## 2018-07-24 LAB — TSH: TSH: 0.857 u[IU]/mL (ref 0.350–4.500)

## 2018-07-24 LAB — URINALYSIS, ROUTINE W REFLEX MICROSCOPIC
Bacteria, UA: NONE SEEN
Bilirubin Urine: NEGATIVE
Glucose, UA: NEGATIVE mg/dL
Hgb urine dipstick: NEGATIVE
Ketones, ur: NEGATIVE mg/dL
Leukocytes,Ua: NEGATIVE
Nitrite: NEGATIVE
Protein, ur: 30 mg/dL — AB
Specific Gravity, Urine: 1.017 (ref 1.005–1.030)
pH: 6 (ref 5.0–8.0)

## 2018-07-24 LAB — CBC
HCT: 24.9 % — ABNORMAL LOW (ref 39.0–52.0)
Hemoglobin: 8.3 g/dL — ABNORMAL LOW (ref 13.0–17.0)
MCH: 32.3 pg (ref 26.0–34.0)
MCHC: 33.3 g/dL (ref 30.0–36.0)
MCV: 96.9 fL (ref 80.0–100.0)
Platelets: 216 10*3/uL (ref 150–400)
RBC: 2.57 MIL/uL — ABNORMAL LOW (ref 4.22–5.81)
RDW: 17.2 % — ABNORMAL HIGH (ref 11.5–15.5)
WBC: 8.4 10*3/uL (ref 4.0–10.5)
nRBC: 0 % (ref 0.0–0.2)

## 2018-07-24 LAB — APTT
aPTT: 49 seconds — ABNORMAL HIGH (ref 24–36)
aPTT: 66 seconds — ABNORMAL HIGH (ref 24–36)

## 2018-07-24 LAB — HEPARIN LEVEL (UNFRACTIONATED): Heparin Unfractionated: >2.2 IU/mL — ABNORMAL HIGH (ref 0.30–0.70)

## 2018-07-24 LAB — TROPONIN I
Troponin I: 0.04 ng/mL (ref <0.03)
Troponin I: 0.04 ng/mL (ref <0.03)

## 2018-07-24 MED ORDER — RANOLAZINE ER 500 MG PO TB12: 500.0000 mg | ORAL_TABLET | Freq: Two times a day (BID) | ORAL | Status: DC

## 2018-07-24 MED ORDER — METOPROLOL TARTRATE 5 MG/5ML IV SOLN
5.0000 mg | Freq: Four times a day (QID) | INTRAVENOUS | Status: DC
  Administered 2018-07-24: 5 mg via INTRAVENOUS
  Filled 2018-07-24: qty 5

## 2018-07-24 MED ORDER — DUTASTERIDE 0.5 MG PO CAPS: 0.5000 mg | ORAL_CAPSULE | Freq: Every day | ORAL | Status: DC

## 2018-07-24 MED ORDER — ACETAMINOPHEN 325 MG PO TABS
650.0000 mg | ORAL_TABLET | Freq: Four times a day (QID) | ORAL | Status: DC | PRN
  Administered 2018-07-25 – 2018-07-28 (×5): 650 mg via ORAL
  Filled 2018-07-24 (×6): qty 2

## 2018-07-24 MED ORDER — ADULT MULTIVITAMIN W/MINERALS CH
1.0000 | ORAL_TABLET | Freq: Every day | ORAL | Status: DC
  Administered 2018-07-24 – 2018-07-29 (×6): 1 via ORAL
  Filled 2018-07-24 (×6): qty 1

## 2018-07-24 MED ORDER — METOPROLOL TARTRATE 25 MG PO TABS: 100.0000 mg | ORAL_TABLET | Freq: Two times a day (BID) | ORAL | Status: DC

## 2018-07-24 MED ORDER — ASPIRIN EC 81 MG PO TBEC
81.0000 mg | DELAYED_RELEASE_TABLET | Freq: Every day | ORAL | Status: DC
  Administered 2018-07-24 – 2018-07-29 (×6): 81 mg via ORAL
  Filled 2018-07-24 (×6): qty 1

## 2018-07-24 MED ORDER — ONDANSETRON HCL 4 MG PO TABS: 4.0000 mg | ORAL_TABLET | Freq: Four times a day (QID) | ORAL | Status: DC | PRN

## 2018-07-24 MED ORDER — ONDANSETRON HCL 4 MG/2ML IJ SOLN: 4.0000 mg | Freq: Four times a day (QID) | INTRAMUSCULAR | Status: DC | PRN

## 2018-07-24 MED ORDER — KETOTIFEN FUMARATE 0.025 % OP SOLN
1.0000 [drp] | Freq: Every day | OPHTHALMIC | Status: DC
  Filled 2018-07-24: qty 5

## 2018-07-24 MED ORDER — BOOST / RESOURCE BREEZE PO LIQD CUSTOM
1.0000 | Freq: Three times a day (TID) | ORAL | Status: DC
  Administered 2018-07-24 – 2018-07-26 (×7): 1 via ORAL

## 2018-07-24 MED ORDER — EZETIMIBE 10 MG PO TABS: 10.0000 mg | ORAL_TABLET | Freq: Every day | ORAL | Status: DC

## 2018-07-24 MED ORDER — DILTIAZEM HCL 30 MG PO TABS
30.0000 mg | ORAL_TABLET | Freq: Four times a day (QID) | ORAL | Status: DC
  Administered 2018-07-24 – 2018-07-29 (×18): 30 mg via ORAL
  Filled 2018-07-24 (×22): qty 1

## 2018-07-24 MED ORDER — SUCRALFATE 1 GM/10ML PO SUSP: 1.0000 g | Freq: Every day | ORAL | Status: DC | PRN

## 2018-07-24 MED ORDER — SODIUM CHLORIDE 0.9% FLUSH
3.0000 mL | Freq: Two times a day (BID) | INTRAVENOUS | Status: DC
  Administered 2018-07-24 – 2018-07-29 (×9): 3 mL via INTRAVENOUS

## 2018-07-24 MED ORDER — ACETAMINOPHEN 650 MG RE SUPP: 650.0000 mg | Freq: Four times a day (QID) | RECTAL | Status: DC | PRN

## 2018-07-24 MED ORDER — HYDRALAZINE HCL 20 MG/ML IJ SOLN: 5.0000 mg | Freq: Four times a day (QID) | INTRAMUSCULAR | Status: DC | PRN

## 2018-07-24 MED ORDER — PANTOPRAZOLE SODIUM 40 MG IV SOLR
40.0000 mg | Freq: Two times a day (BID) | INTRAVENOUS | Status: DC
  Administered 2018-07-24 – 2018-07-27 (×8): 40 mg via INTRAVENOUS
  Filled 2018-07-24 (×8): qty 40

## 2018-07-24 MED ORDER — HEPARIN (PORCINE) 25000 UT/250ML-% IV SOLN
1100.0000 [IU]/h | INTRAVENOUS | Status: AC
  Administered 2018-07-24: 900 [IU]/h via INTRAVENOUS
  Administered 2018-07-25: 1100 [IU]/h via INTRAVENOUS
  Filled 2018-07-24 (×2): qty 250

## 2018-07-24 MED ORDER — PRAVASTATIN SODIUM 10 MG PO TABS: 20.0000 mg | ORAL_TABLET | Freq: Every day | ORAL | Status: DC

## 2018-07-24 MED ORDER — APIXABAN 5 MG PO TABS: 5.0000 mg | ORAL_TABLET | Freq: Two times a day (BID) | ORAL | Status: DC

## 2018-07-24 NOTE — Progress Notes (Signed)
ANTICOAGULATION CONSULT NOTE - [place] for heparin Indication: atrial fibrillation  Allergies  Allergen Reactions  . Codeine Nausea And Vomiting  . Sulfa Antibiotics Rash  . Sulfonamide Derivatives Rash    Patient Measurements: Height: 5\' 5"  (165.1 cm) Weight: 136 lb 11 oz (62 kg) IBW/kg (Calculated) : 61.5 Heparin Dosing Weight: 61kg  Vital Signs: Temp: 97.9 F (36.6 C) (05/19 1158) Temp Source: Oral (05/19 1158) BP: 116/72 (05/19 1158) Pulse Rate: 81 (05/19 1158)  Labs: Recent Labs    07/23/18 2045 07/24/18 0219 07/24/18 0723 07/24/18 1127  HGB 8.3*  --  8.3*  --   HCT 25.5*  --  24.9*  --   PLT 223  --  216  --   APTT  --   --   --  49*  CREATININE 1.05  --  1.15  --   TROPONINI 0.04* 0.04* 0.04*  --     Estimated Creatinine Clearance: 49 mL/min (by C-G formula based on SCr of 1.15 mg/dL).   Medical History: Past Medical History:  Diagnosis Date  . Anemia   . Angina   . Atrial fibrillation ([place])   . Blood transfusion   . Bronchitis   . Bulging discs    "8 of them; thoracic, lumbar, sacral area"  . Chronic back pain   . Colon polyps   . Complication of anesthesia   . Coronary artery disease 01/24/11   Successful PCI long segmental stensois  vein graft to the CX marginal vessel-graft previously stented prox. 3.0x48mm TAXUS stent mid seg 3.0x47mm TAXUS stent now tandem stens of 3.0x58mm & 3.0x52mm placed with the seg. 50,80 & 99% stenosis reduced to 0%  . DDD (degenerative disc disease)   . Diastolic heart failure ([place])   . Dysrhythmia    "PAC's and PVC's"  . Esophageal dilatation    "have had it done 7 times; last time ~ 2001"  . Esophageal stricture   . GERD (gastroesophageal reflux disease)   . Headache(784.0)   . Hiatal hernia   . Hyperlipidemia   . Myocardial infarction ([place]) 1991  . Myocardial infarction [facility]) 2005   "had 3 heart attacks this year"  . Paroxysmal atrial fibrillation (HCC)   . Pyloric stenosis    . RBBB   . Renal artery stenosis (HCC)    hhistory of right renal artery stenting and re-intervention for "in-stent restenosis.  . S/P CABG (coronary artery bypass graft) [place]  . Shortness of breath    "when I had the heart attacks"  . Subclavian artery stenosis, left ([place])   . Ulcer    "small; Dr. Henrene Pastor found it 02/2010"    Assessment: 20 YOM on Eliquis PTA for afib admitted with dysphagia. Pharmacy asked to dose IV heparin. Initial aPTT subtherapeutic at 49 seconds, heparin level still falsely elevated due to recent DOAC use, CBC stable this morning.   Goal of Therapy:  Heparin level 0.3-0.7 units/ml aPTT 66-102 seconds Monitor platelets by anticoagulation protocol: Yes   Plan:  -Increase heparin to 1050 units/hr -Recheck aPTT in [place], PharmD, BCPS Clinical Pharmacist [phone] Please check AMION for all [place] numbers 07/24/2018

## 2018-07-24 NOTE — Progress Notes (Signed)
Initial Nutrition Assessment  RD working remotely.  DOCUMENTATION CODES:   Not applicable  INTERVENTION:   -Boost Breeze po TID, each supplement provides 250 kcal and 9 grams of protein -MVI with minerals daily -RD will follow for diet advancement and adjust supplement regimen as appropriate  NUTRITION DIAGNOSIS:   Inadequate oral intake related to dysphagia as evidenced by meal completion < 25%, per patient/family report.  GOAL:   Patient will meet greater than or equal to 90% of their needs  MONITOR:   PO intake, Supplement acceptance, Diet advancement, Labs, Weight trends, Skin, I & O's  REASON FOR ASSESSMENT:   Malnutrition Screening Tool    ASSESSMENT:   [name] is a 73 y.o. male with medical history significant for CAD s/p CABG x2 in 1984 and 1996, atrial fibrillation on Eliquis, chronic mesenteric ischemia s/p SMA and celiac stenting 07/16/18, PVD s/p renal artery stenting 2005 and Rt CEA 2005, CKD stage 2-3, GERD, PUD, peptic and esophageal strictures with previous esophageal dilatations, recent right hip fracture status post ORIF who presents to the hospital with severe heartburn, dysphagia, nausea, and occasional emesis.  Pt admitted with dysphagia, GERD, esophageal stenosis, and history of esophageal dilation.  Reviewed I/O's: -580 ml x 24 hours  UOP: 580 ml x 24 hours  Spoke with pt on phone, who was pleasant and in good spirits today. Pt reports feeling a little today. He reports decreased oral intake over the past few months, related to difficulty swallowing, which has progressively gotten worse during this time period. Pt reports that she has admitted to the hospital for similar complaints last week and swallowing and intake has worsened since previous discharge. Pt expressed pain when attempting to eat and has been consuming only soda crackers and Boost/Ensure supplements 2-3 times per day over the past 3 days. He tolerated some of the clear liquids this  morning, however, still felt a small amount of pain when eating.   Pt endorses wt loss, reporting UBW is around. Per his report, he last weighed UBW is January or February when he was "hospitalized for a virus". He estimates he has lost approximately 10# over that time period, however, this is not consistent with wt hx. Wt records indicate that pt has experienced a 4.9% wt loss over the past 6 months, which while not significant for time frame, is concerning when coupled with history of poor oral intake and dysphagia.   Pt reports SLP evaluated earlier today and is recommending a GI evaluation. Pt amenable to clear liquid diet supplements until diet is advanced.   Labs reviewed: Na: 129.  NUTRITION - FOCUSED PHYSICAL EXAM:    Most Recent Value  Orbital Region  Unable to assess  Upper Arm Region  Unable to assess  Thoracic and Lumbar Region  Unable to assess  Buccal Region  Unable to assess  Temple Region  Unable to assess  Clavicle Bone Region  Unable to assess  Clavicle and Acromion Bone Region  Unable to assess  Scapular Bone Region  Unable to assess  Dorsal Hand  Unable to assess  Patellar Region  Unable to assess  Anterior Thigh Region  Unable to assess  Posterior Calf Region  Unable to assess  Edema (RD Assessment)  Unable to assess  Hair  Unable to assess  Eyes  Unable to assess  Mouth  Unable to assess  Skin  Unable to assess  Nails  Unable to assess       Diet Order:  Diet Order            Diet clear liquid Room service appropriate? Yes with Assist; Fluid consistency: Thin  Diet effective now              EDUCATION NEEDS:   Education needs have been addressed  Skin:  Skin Assessment: Skin Integrity Issues: Skin Integrity Issues:: Incisions Incisions: closed rt hip and lt groin  Last BM:  07/22/18  Height:   Ht Readings from Last 1 Encounters:  07/24/18 5\' 5"  (1.651 m)    Weight:   Wt Readings from Last 1 Encounters:  07/24/18 62 kg    Ideal  Body Weight:  59.1 kg  BMI:  Body mass index is 22.75 kg/m.  Estimated Nutritional Needs:   Kcal:  1850-2050  Protein:  90-105 grams  Fluid:  > 1.8 L    Tvisha Schwoerer A. Jimmye Norman, RD, LDN, [place] Registered Dietitian II Certified Diabetes Care and Education Specialist Pager: [phone] After hours Pager: [phone]

## 2018-07-24 NOTE — Evaluation (Signed)
Clinical/Bedside Swallow Evaluation Patient Details  Name: [name] MRN: [id number] Date of Birth: [birth year]  Today's Date: 07/24/2018 Time: SLP Start Time (ACUTE ONLY): 9678 SLP Stop Time (ACUTE ONLY): 0945 SLP Time Calculation (min) (ACUTE ONLY): 20 min  Past Medical History:  Past Medical History:  Diagnosis Date  . Anemia   . Angina   . Atrial fibrillation ([place])   . Blood transfusion   . Bronchitis   . Bulging discs    "8 of them; thoracic, lumbar, sacral area"  . Chronic back pain   . Colon polyps   . Complication of anesthesia   . Coronary artery disease 01/24/11   Successful PCI long segmental stensois  vein graft to the CX marginal vessel-graft previously stented prox. 3.0x28mm TAXUS stent mid seg 3.0x68mm TAXUS stent now tandem stens of 3.0x4mm & 3.0x73mm placed with the seg. 50,80 & 99% stenosis reduced to 0%  . DDD (degenerative disc disease)   . Diastolic heart failure ([place])   . Dysrhythmia    "PAC's and PVC's"  . Esophageal dilatation    "have had it done 7 times; last time ~ 2001"  . Esophageal stricture   . GERD (gastroesophageal reflux disease)   . Headache(784.0)   . Hiatal hernia   . Hyperlipidemia   . Myocardial infarction ([place]) 1991  . Myocardial infarction [facility]) 2005   "had 3 heart attacks this year"  . Paroxysmal atrial fibrillation (HCC)   . Pyloric stenosis   . RBBB   . Renal artery stenosis (H[facility])    hhistory of right renal artery stenting and re-intervention for "in-stent restenosis.  . S/P CABG (coronary artery bypass graft) [place]  . Shortness of breath    "when I had the heart attacks"  . Subclavian artery stenosis, left ([place])   . Ulcer    "small; Dr. Henrene Pastor found it 02/2010"   Past Surgical History:  Past Surgical History:  Procedure Laterality Date  . ADENOIDECTOMY     "when I was an infant"  . APPENDECTOMY  1953  . CARDIOVERSION N/A 04/17/2017   Procedure: CARDIOVERSION;  Surgeon: Jerline Pain, MD;  Location: [facility] ENDOSCOPY;  Service: Cardiovascular;  Laterality: N/A;  . CAROTID ENDARTERECTOMY Right [place]  01/24/11   "today makes a total of 9 stents"  . CORONARY ARTERY BYPASS GRAFT  1984   CABG X 4  . CORONARY ARTERY BYPASS GRAFT  1996   CABG X 4  . LEFT HEART CATHETERIZATION WITH CORONARY/GRAFT ANGIOGRAM N/A 01/24/2011   Procedure: LEFT HEART CATHETERIZATION WITH Beatrix Fetters;  Surgeon: Troy Sine, MD;  Location: [facility] CATH LAB;  Service: Cardiovascular;  Laterality: N/A;  . PERIPHERAL VASCULAR INTERVENTION  07/16/2018   Procedure: PERIPHERAL VASCULAR INTERVENTION;  Surgeon: Elam Dutch, MD;  Location: [place] CV LAB;  Service: Cardiovascular;;  SMA and Celiac  . pylondial cyst removal    . RETINAL DETACHMENT SURGERY  04/2007   right  . TONSILLECTOMY  1972  . VISCERAL ANGIOGRAPHY N/A 07/16/2018   Procedure: VISCERAL ANGIOGRAPHY;  Surgeon: Elam Dutch, MD;  Location: [place] CV LAB;  Service: Cardiovascular;  Laterality: N/A;   HPI:  Patient is a 73 y.o. male with PMH: GERD, CAD s/p CABG x2 in 1984 and 1996, atrial fibrillation on Eliquis, chronic mesenteric ischemia s/p SMA and celiac stenting 07/16/18, PVD s/p renal artery stenting 2005 and right CEA  2005, CKD stage 2-3, h/o 5 esophageal dilitations with most recent being in 2003. He was admitted to hospital with nausea, vomitting and swelling of legs. Patient stated that following recent hospitalization 5/7-5/16, he has had severe heartburn and intractable nausea vomitting with any oral intake, including solids, liquids and his medications.    Assessment / Plan / Recommendation Clinical Impression  Patient demonstrates an oropharyngeal dysphagia that is [facility] and without overt s/s of aspiration or penetration. Based on patient's h/o esophageal stricture with dilitations, and current complaint of food "not going down", symptoms of nausea after PO  intake, his primary dysphagia continues to be esophageal in nature. Patient is able to describe strategies he has been taught in the past related to his dysphagia, stated his GI MD is Dr. Henrene Pastor. Patient did state that currently, his nausea and difficulty swallowing problems have gotten better. Patient will benefit from GI consult however no SLP intervention indicated at this time.  SLP Visit Diagnosis: Dysphagia, pharyngoesophageal phase (R13.14)    Aspiration Risk  Mild aspiration risk    Diet Recommendation Thin liquid(spoke with MD and SLP in agreement to start patient on clear, thin liquids and consult with GI)   Liquid Administration via: Cup;Straw Medication Administration: Whole meds with puree Supervision: Patient able to self feed    Other  Recommendations     Follow up Recommendations None      Frequency and Duration   N/A         Prognosis   N/A     Swallow Study   General Date of Onset: 07/24/18 HPI: Patient is a 73 y.o. male with PMH: GERD, CAD s/p CABG x2 in 1984 and 1996, atrial fibrillation on Eliquis, chronic mesenteric ischemia s/p SMA and celiac stenting 07/16/18, PVD s/p renal artery stenting 2005 and right CEA 2005, CKD stage 2-3, h/o 5 esophageal dilitations with most recent being in 2003. He was admitted to hospital with nausea, vomitting and swelling of legs. Patient stated that following recent hospitalization 5/7-5/16, he has had severe heartburn and intractable nausea vomitting with any oral intake, including solids, liquids and his medications.  Type of Study: Bedside Swallow Evaluation Previous Swallow Assessment: BSE on previous admission  Diet Prior to this Study: NPO Temperature Spikes Noted: No Respiratory Status: Room air History of Recent Intubation: No Behavior/Cognition: Alert;Cooperative;Pleasant mood Oral Cavity Assessment: Within Functional Limits Oral Care Completed by SLP: No Oral Cavity - Dentition: Adequate natural dentition Vision:  Functional for self-feeding Self-Feeding Abilities: Able to feed self Patient Positioning: Upright in chair Baseline Vocal Quality: Normal Volitional Cough: Strong Volitional Swallow: Able to elicit    Oral/Motor/Sensory Function Overall Oral Motor/Sensory Function: Within functional limits   Ice Chips     Thin Liquid Thin Liquid: Within functional limits Presentation: Straw;Self Fed    Nectar Thick Nectar Thick Liquid: Not tested   Honey Thick Honey Thick Liquid: Not tested   Puree Puree: Not tested   Solid     Solid: Not tested      Nadara Mode Tarrell 07/24/2018,3:53 PM    Sonia Baller, MA, CCC-SLP Speech Therapy [facility] Acute Rehab Pager: [phone]

## 2018-07-24 NOTE — Evaluation (Signed)
Physical Therapy Evaluation Patient Details Name: [name] MRN: [id number] DOB: [birth year] Today's Date: 07/24/2018   History of Present Illness  73 yo male admitted with N/V edema pt with CHf and possible esophageal stenosis. PMhx: Rt hip fx with ORIF april 2020, CAD, CABG, AFib, mesenteric ischemia, PVD, Rt CEA, CKD, NSTEMI May 2020  Clinical Impression  Pt pleasant and reports difficulty with moving since Rt hip ORIF in April with assist for ADLs and reliance on RW. Pt with limited gait today due to fatigue with low Hgb and NPO.Pt with decreased strength, transfers, gait and function who will benefit from acute therapy to maximize mobility, safety and function.      Follow Up Recommendations Home health PT;Supervision for mobility/OOB    Equipment Recommendations  None recommended by PT    Recommendations for Other Services       Precautions / Restrictions Precautions Precautions: Fall Restrictions RLE Weight Bearing: Weight bearing as tolerated      Mobility  Bed Mobility Overal bed mobility: Modified Independent Bed Mobility: Supine to Sit           General bed mobility comments: increased time and struggle to move legs to EOB, normally sleeps in recliner due to reflux  Transfers Overall transfer level: Needs assistance   Transfers: Sit to/from Stand Sit to Stand: Supervision         General transfer comment: cues for hand placement, increased time  Ambulation/Gait Ambulation/Gait assistance: Supervision Gait Distance (Feet): 100 Feet Assistive device: Rolling walker (2 wheeled) Gait Pattern/deviations: Step-through pattern;Decreased stride length;Trunk flexed   Gait velocity interpretation: <1.8 ft/sec, indicate of risk for recurrent falls General Gait Details: slow steady gait, limited by fatigue  Stairs            Wheelchair Mobility    Modified Rankin (Stroke Patients Only)       Balance Overall balance assessment: Mild deficits  observed, not formally tested                                           Pertinent Vitals/Pain Faces Pain Scale: Hurts a little bit Pain Location: left groin incision Pain Descriptors / Indicators: Sore Pain Intervention(s): Limited activity within patient's tolerance;Monitored during session    Home Living Family/patient expects to be discharged to:: Private residence Living Arrangements: Spouse/significant other Available Help at Discharge: Family;Available 24 hours/day Type of Home: House Home Access: Stairs to enter   [employer] of Steps: 2 Home Layout: One level Home Equipment: Walker - 2 wheels;Cane - quad;Bedside commode;Toilet riser;Tub bench      Prior Function Level of Independence: Needs assistance   Gait / Transfers Assistance Needed: pt walking with RW since hip ORIF  ADL's / Homemaking Assistance Needed: assist for lower body bathing and dressing since hip ORIF  Comments: son is a PTA and his girlfriend is a Education officer, environmental who have been giving pt education and pointers     Hand Dominance        Extremity/Trunk Assessment   Upper Extremity Assessment Upper Extremity Assessment: Generalized weakness    Lower Extremity Assessment Lower Extremity Assessment: Generalized weakness    Cervical / Trunk Assessment Cervical / Trunk Assessment: Kyphotic  Communication   Communication: No difficulties  Cognition Arousal/Alertness: Awake/alert Behavior During Therapy: WFL for tasks assessed/performed Overall Cognitive Status: Within Functional Limits for tasks assessed  General Comments      Exercises General Exercises - Lower Extremity Long Arc Quad: AAROM;10 reps;Seated;Right Hip ABduction/ADduction: AAROM;10 reps;Seated;Right Hip Flexion/Marching: AAROM;10 reps;Seated;Right   Assessment/Plan    PT Assessment Patient needs continued PT services  PT Problem List Decreased  activity tolerance;Decreased balance;Decreased mobility;Decreased knowledge of use of DME;Pain;Decreased strength       PT Treatment Interventions DME instruction;Gait training;Stair training;Therapeutic activities;Patient/family education;Functional mobility training;Balance training;Therapeutic exercise    PT Goals (Current goals can be found in the Care Plan section)  Acute Rehab PT Goals Patient Stated Goal: return home PT Goal Formulation: With patient Time For Goal Achievement: 08/07/18 Potential to Achieve Goals: Good    Frequency Min 3X/week   Barriers to discharge        Co-evaluation               AM-PAC PT "6 Clicks" Mobility  Outcome Measure Help needed turning from your back to your side while in a flat bed without using bedrails?: A Little Help needed moving from lying on your back to sitting on the side of a flat bed without using bedrails?: A Little Help needed moving to and from a bed to a chair (including a wheelchair)?: A Little Help needed standing up from a chair using your arms (e.g., wheelchair or bedside chair)?: A Little Help needed to walk in hospital room?: A Little Help needed climbing 3-5 steps with a railing? : A Little 6 Click Score: 18    End of Session Equipment Utilized During Treatment: Gait belt Activity Tolerance: Patient tolerated treatment well Patient left: in chair;with call bell/phone within reach;with chair alarm set Nurse Communication: Mobility status PT Visit Diagnosis: Other abnormalities of gait and mobility (R26.89);Difficulty in walking, not elsewhere classified (R26.2);Muscle weakness (generalized) (M62.81)    Time: 7915-0413 PT Time Calculation (min) (ACUTE ONLY): 21 min   Charges:   PT Evaluation $PT Eval Moderate Complexity: 1 Mod          [place], PT Acute Rehabilitation Services Pager: [phone] Office: [phone]   Adian Jablonowski B Deklen Popelka 07/24/2018, 8:54 AM

## 2018-07-24 NOTE — Progress Notes (Signed)
ANTICOAGULATION CONSULT NOTE - [place] for heparin Indication: atrial fibrillation  Allergies  Allergen Reactions  . Codeine Nausea And Vomiting  . Sulfa Antibiotics Rash  . Sulfonamide Derivatives Rash    Patient Measurements: Height: 5\' 5"  (165.1 cm) Weight: 136 lb 11 oz (62 kg) IBW/kg (Calculated) : 61.5 Heparin Dosing Weight: 61kg  Vital Signs: Temp: 98.6 F (37 C) (05/19 2007) Temp Source: Oral (05/19 2007) BP: 107/63 (05/19 2007) Pulse Rate: 86 (05/19 2007)  Labs: Recent Labs    07/23/18 2045 07/24/18 0219 07/24/18 0723 07/24/18 1127 07/24/18 2122  HGB 8.3*  --  8.3*  --   --   HCT 25.5*  --  24.9*  --   --   PLT 223  --  216  --   --   APTT  --   --   --  49* 66*  HEPARINUNFRC  --   --   --  >2.20*  --   CREATININE 1.05  --  1.15  --   --   TROPONINI 0.04* 0.04* 0.04*  --   --     Estimated Creatinine Clearance: 49 mL/min (by C-G formula based on SCr of 1.15 mg/dL).  Assessment: 26 YOM on Eliquis PTA for afib admitted with dysphagia. Pharmacy asked to dose IV heparin. Initial aPTT subtherapeutic at 49 seconds, heparin level still falsely elevated due to recent DOAC use, CBC stable this morning.  aptt now at goal 66  Goal of Therapy:  Heparin level 0.3-0.7 units/ml aPTT 66-102 seconds Monitor platelets by anticoagulation protocol: Yes   Plan:  -Increase heparin to 1100 units/hr -daily aPTT HL  Levester Fresh, PharmD, BCPS, BCCCP Clinical Pharmacist [phone]  Please check AMION for all [place] numbers  07/24/2018 10:03 PM

## 2018-07-24 NOTE — Progress Notes (Signed)
PROGRESS NOTE  [name] NGE:[id number] DOB: 1945/04/02 DOA: 07/23/2018 PCP: Garwin Brothers, MD   LOS: 1 day   Patient is from: Home  Brief Narrative / Interim history: [name] is a 73 y.o. male with medical history significant for CAD s/p CABG x2 in 1984 and 1996, atrial fibrillation on Eliquis, chronic mesenteric ischemia s/p SMA and celiac stenting 07/16/18, PVD s/p renal artery stenting 2005 and Rt CEA 2005, CKD stage 2-3, GERD, PUD, peptic and esophageal strictures with previous esophageal dilatations, recent right hip fracture status post ORIF who presents to the hospital with severe heartburn, dysphagia, nausea, and occasional emesis likely due to underlying GI issue.  In ED, hemodynamically stable.  CBC remarkable for hemoglobin to 8.3 (baseline).  CMP with mild hyponatremia to 131 (about baseline).  Troponin 0 0.042.  BNP 1122 (no prior to compare to).  COVID-19 test negative.  CXR without acute finding.  Admitted for N/V/D/dysphagia.   Subjective: No major events overnight of this morning.  Reports dysphagia and odynophagia with both fluids and solid diet.  He said this got worse since recent hospitalization for heart problem.  Pain is mainly over epigastric area.  Denies melena or hematochezia.  Denies URI symptoms, fever or chills.  Denies dyspnea or chest pain.  Assessment & Plan: Dysphagia/odynophagia/GERD/nausea/vomiting in patient with known history of esophageal stricture status post dilation x7 -Evaluated by SLP who recommended GI evaluation. -[facility] consulted. -Continue IV Protonix twice daily -Continue Carafate -Initiate clear liquid diet  Chronic diastolic CHF: EF 40-10%, mild MR, mild TR, and mild pulmonary hypertension by TTE 08/24/2016.  Has 2+ pitting edema but no cardiopulmonary symptoms.  His edema could be due to malnutrition.  BNP elevated to 1120 but no baseline to compare to.  -Resume home p.o. meds. -Hold home diuretics. -Daily weight, intake output  and renal function. -We will obtain TSH, prealbumin.   -Urinalysis without significant proteinuria.  Peripheral vascular disease/chronic mesenteric ischemia status post SMA and celiac stenting on 07/16/2018. -Continue home medications.  CAD status post CABG: Stable.  No anginal symptoms.  Troponin flat at 0.04.  EKG with A. fib, rate controlled, RBBB, LPFB and occasional PVCs but no acute ischemic finding. -Continue home medications.  Permanent A. fib without RVR. -Continue home meds. -Hold Eliquis.  IV heparin in case he needs GI procedure.  Anemia of chronic disease: Hgb at baseline. -Continue monitoring  Hypertension: Normotensive. -Continue home meds  Right hip fracture status post ORIF: Reports progressing well and ambulating with use of a walker at home now. -PT eval  Scheduled Meds: . aspirin EC  81 mg Oral Daily  . ketotifen  1 drop Right Eye Daily  . metoprolol tartrate  5 mg Intravenous Q6H  . pantoprazole (PROTONIX) IV  40 mg Intravenous Q12H  . sodium chloride flush  3 mL Intravenous Q12H   Continuous Infusions: . heparin 900 Units/hr (07/24/18 0342)   PRN Meds:.acetaminophen **OR** acetaminophen, hydrALAZINE, ondansetron **OR** ondansetron (ZOFRAN) IV, sucralfate   DVT prophylaxis: On heparin drip for A. fib Code Status: Full code Family Communication: None at bedside Disposition Plan: Remains inpatient for evaluation of dysphagia  Consultants:   Gastroenterology  Procedures:   None  Microbiology: . COVID-19 negative  Antimicrobials: Anti-infectives (From admission, onward)   None       Objective: Vitals:   07/23/18 2200 07/23/18 2230 07/24/18 0040 07/24/18 0534  BP: (!) 172/90 (!) 161/84 134/83 (!) 96/47  Pulse: 84 90 98 85  Resp: 15 (!)  21 18 18   Temp:   97.8 F (36.6 C) 98 F (36.7 C)  TempSrc:   Oral   SpO2: 98% 100% 100% 99%  Weight:   62 kg   Height:   5\' 5"  (1.651 m)     Intake/Output Summary (Last 24 hours) at  07/24/2018 0738 Last data filed at 07/24/2018 0100 Gross per 24 hour  Intake -  Output 580 ml  Net -580 ml   Filed Weights   07/24/18 0040  Weight: 62 kg    Examination:  GENERAL: No acute distress.  Appears well.  HEENT: MMM.  No orientation.  No apparent lesion.  Vision and hearing grossly intact.  NECK: Supple.  No JVD.  No lymphadenopathy or deformity. LUNGS:  No IWOB. Good air movement bilaterally. HEART: Regular rhythm.  Normal rate. Heart sounds normal.  2+ pitting edema bilaterally. ABD: Bowel sounds present. Soft. Non tender.  MSK/EXT:  Moves all extremities. No apparent deformity.  2+ pitting edema bilaterally. SKIN: no apparent skin lesion or wound NEURO: Awake, alert and oriented appropriately.  No gross deficit.  PSYCH: Calm. Normal affect.    Data Reviewed: I have independently reviewed following labs and imaging studies   CBC: Recent Labs  Lab 07/18/18 0326 07/19/18 0246 07/20/18 0758 07/23/18 2045  WBC 9.0 6.5 7.9 9.5  NEUTROABS 7.0  --  5.9 8.3*  HGB 7.9* 7.9* 8.4* 8.3*  HCT 22.7* 23.0* 25.3* 25.5*  MCV 92.3 93.1 95.1 99.2  PLT 178 182 202 229   Basic Metabolic Panel: Recent Labs  Lab 07/18/18 0326 07/19/18 0246 07/20/18 0758 07/23/18 2045  NA 132* 130* 130* 131*  K 4.3 4.3 4.3 3.7  CL 99 99 100 98  CO2 23 23 24 23   GLUCOSE 133* 123* 131* 116*  BUN 27* 25* 24* 20  CREATININE 1.36* 1.29* 1.27* 1.05  CALCIUM 8.6* 8.3* 8.4* 8.4*  MG 1.9  --   --  1.8   GFR: Estimated Creatinine Clearance: 53.7 mL/min (by C-G formula based on SCr of 1.05 mg/dL). Liver Function Tests: Recent Labs  Lab 07/20/18 0758 07/23/18 2045  AST 15 15  ALT 14 12  ALKPHOS 83 82  BILITOT 0.9 1.2  PROT 5.5* 5.3*  ALBUMIN 2.8* 2.7*   No results for input(s): LIPASE, AMYLASE in the last 168 hours. No results for input(s): AMMONIA in the last 168 hours. Coagulation Profile: No results for input(s): INR, PROTIME in the last 168 hours. Cardiac Enzymes: Recent Labs   Lab 07/23/18 2045 07/24/18 0219  TROPONINI 0.04* 0.04*   BNP (last 3 results) No results for input(s): PROBNP in the last 8760 hours. HbA1C: No results for input(s): HGBA1C in the last 72 hours. CBG: No results for input(s): GLUCAP in the last 168 hours. Lipid Profile: No results for input(s): CHOL, HDL, LDLCALC, TRIG, CHOLHDL, LDLDIRECT in the last 72 hours. Thyroid Function Tests: No results for input(s): TSH, T4TOTAL, FREET4, T3FREE, THYROIDAB in the last 72 hours. Anemia Panel: No results for input(s): VITAMINB12, FOLATE, FERRITIN, TIBC, IRON, RETICCTPCT in the last 72 hours. Urine analysis:    Component Value Date/Time   COLORURINE YELLOW 07/24/2018 0108   APPEARANCEUR CLEAR 07/24/2018 0108   LABSPEC 1.017 07/24/2018 0108   PHURINE 6.0 07/24/2018 0108   GLUCOSEU NEGATIVE 07/24/2018 0108   HGBUR NEGATIVE 07/24/2018 0108   BILIRUBINUR NEGATIVE 07/24/2018 0108   KETONESUR NEGATIVE 07/24/2018 0108   PROTEINUR 30 (A) 07/24/2018 0108   NITRITE NEGATIVE 07/24/2018 0108   LEUKOCYTESUR NEGATIVE 07/24/2018  9833   Sepsis Labs: Invalid input(s): PROCALCITONIN, LACTICIDVEN  Recent Results (from the past 240 hour(s))  SARS Coronavirus 2 (CEPHEID - Performed in [place] hospital lab), Hosp Order     Status: None   Collection Time: 07/23/18  8:45 PM  Result Value Ref Range Status   SARS Coronavirus 2 NEGATIVE NEGATIVE Final    Comment: (NOTE) If result is NEGATIVE SARS-CoV-2 target nucleic acids are NOT DETECTED. The SARS-CoV-2 RNA is generally detectable in upper and lower  respiratory specimens during the acute phase of infection. The lowest  concentration of SARS-CoV-2 viral copies this assay can detect is 250  copies / mL. A negative result does not preclude SARS-CoV-2 infection  and should not be used as the sole basis for treatment or other  patient management decisions.  A negative result may occur with  improper specimen collection / handling, submission of specimen  other  than nasopharyngeal swab, presence of viral mutation(s) within the  areas targeted by this assay, and inadequate number of viral copies  (<250 copies / mL). A negative result must be combined with clinical  observations, patient history, and epidemiological information. If result is POSITIVE SARS-CoV-2 target nucleic acids are DETECTED. The SARS-CoV-2 RNA is generally detectable in upper and lower  respiratory specimens dur ing the acute phase of infection.  Positive  results are indicative of active infection with SARS-CoV-2.  Clinical  correlation with patient history and other diagnostic information is  necessary to determine patient infection status.  Positive results do  not rule out bacterial infection or co-infection with other viruses. If result is PRESUMPTIVE POSTIVE SARS-CoV-2 nucleic acids MAY BE PRESENT.   A presumptive positive result was obtained on the submitted specimen  and confirmed on repeat testing.  While 2019 novel coronavirus  (SARS-CoV-2) nucleic acids may be present in the submitted sample  additional confirmatory testing may be necessary for epidemiological  and / or clinical management purposes  to differentiate between  SARS-CoV-2 and other Sarbecovirus currently known to infect humans.  If clinically indicated additional testing with an alternate test  methodology [phone]) is advised. The SARS-CoV-2 RNA is generally  detectable in upper and lower respiratory sp ecimens during the acute  phase of infection. The expected result is Negative. Fact Sheet for Patients:  StrictlyIdeas.no Fact Sheet for Healthcare Providers: BankingDealers.co.za This test is not yet approved or cleared by the [place] FDA and has been authorized for detection and/or diagnosis of SARS-CoV-2 by FDA under an Emergency Use Authorization (EUA).  This EUA will remain in effect (meaning this test can be used) for the duration  of the COVID-19 declaration under Section 564(b)(1) of the Act, 21 U.S.C. section 360bbb-3(b)(1), unless the authorization is terminated or revoked sooner. Performed at [facility], [place] [address] [place], [place] [address]       Radiology Studies: Dg Chest Portable 1 View  Result Date: 07/23/2018 CLINICAL DATA:  Weakness, fatigue, lower extremity swelling. EXAM: PORTABLE CHEST 1 VIEW COMPARISON:  CT abdomen pelvis dated Jul 13, 2018. CT chest and chest x-ray dated Jul 12, 2018. FINDINGS: The patient remains rotated to the right. Stable cardiomediastinal silhouette status post CABG. Normal pulmonary vascularity. Small right pleural effusion with mild right basilar atelectasis, unchanged since prior CT abdomen from 07/13/2018. No consolidation or pneumothorax. No acute osseous abnormality. IMPRESSION: 1. Unchanged small right pleural effusion. Electronically Signed   By: Titus Dubin M.D.   On: 07/23/2018 21:08   35 minutes with more  than 50% spent in reviewing records, counseling patient and coordinating care.  Toshiko Kemler T. [facility] Pager [phone]  If 7PM-7AM, please contact night-coverage [email or web address] Password [facility] 07/24/2018, 7:38 AM

## 2018-07-24 NOTE — Progress Notes (Signed)
ANTICOAGULATION CONSULT NOTE - Initial Consult  Pharmacy Consult for heparin Indication: atrial fibrillation  Allergies  Allergen Reactions  . Codeine Nausea And Vomiting  . Sulfa Antibiotics Rash  . Sulfonamide Derivatives Rash    Patient Measurements:   Heparin Dosing Weight: 61kg  Vital Signs: Temp: 98.7 F (37.1 C) (05/18 2103) Temp Source: Rectal (05/18 2103) BP: 161/84 (05/18 2230) Pulse Rate: 90 (05/18 2230)  Labs: Recent Labs    07/23/18 2045  HGB 8.3*  HCT 25.5*  PLT 223  CREATININE 1.05  TROPONINI 0.04*    Estimated Creatinine Clearance: 53.3 mL/min (by C-G formula based on SCr of 1.05 mg/dL).   Medical History: Past Medical History:  Diagnosis Date  . Anemia   . Angina   . Atrial fibrillation ([place])   . Blood transfusion   . Bronchitis   . Bulging discs    "8 of them; thoracic, lumbar, sacral area"  . Chronic back pain   . Colon polyps   . Complication of anesthesia   . Coronary artery disease 01/24/11   Successful PCI long segmental stensois  vein graft to the CX marginal vessel-graft previously stented prox. 3.0x21mm TAXUS stent mid seg 3.0x37mm TAXUS stent now tandem stens of 3.0x36mm & 3.0x46mm placed with the seg. 50,80 & 99% stenosis reduced to 0%  . DDD (degenerative disc disease)   . Dysrhythmia    "PAC's and PVC's"  . Esophageal dilatation    "have had it done 7 times; last time ~ 2001"  . Esophageal stricture   . GERD (gastroesophageal reflux disease)   . Headache(784.0)   . Hiatal hernia   . Hyperlipidemia   . Myocardial infarction ([place]) 1991  . Myocardial infarction [facility]) 2005   "had 3 heart attacks this year"  . Paroxysmal atrial fibrillation (HCC)   . Pyloric stenosis   . RBBB   . Renal artery stenosis (HCC)    hhistory of right renal artery stenting and re-intervention for "in-stent restenosis.  . S/P CABG (coronary artery bypass graft) [place]  . Shortness of breath    "when I had  the heart attacks"  . Subclavian artery stenosis, left ([place])   . Ulcer    "small; Dr. Henrene Pastor found it 02/2010"    Assessment: 46 YOM on Eliquis PTA for afib, last dose taken 5/17 @2200  ,  chronic anemia stable.    Goal of Therapy:  Heparin level 0.3-0.7 units/ml aPTT 66-102 seconds Monitor platelets by anticoagulation protocol: Yes   Plan:  Start Heparin gtt at 900 units/hr, no bolus F/u 8 hour aPTT/HL  Bertis Ruddy, PharmD Clinical Pharmacist Please check AMION for all [place] numbers 07/24/2018 12:43 AM

## 2018-07-25 DIAGNOSIS — E440 Moderate protein-calorie malnutrition: Secondary | ICD-10-CM

## 2018-07-25 LAB — CBC
HCT: 22.5 % — ABNORMAL LOW (ref 39.0–52.0)
Hemoglobin: 7.5 g/dL — ABNORMAL LOW (ref 13.0–17.0)
MCH: 32.6 pg (ref 26.0–34.0)
MCHC: 33.3 g/dL (ref 30.0–36.0)
MCV: 97.8 fL (ref 80.0–100.0)
Platelets: 190 10*3/uL (ref 150–400)
RBC: 2.3 MIL/uL — ABNORMAL LOW (ref 4.22–5.81)
RDW: 17.2 % — ABNORMAL HIGH (ref 11.5–15.5)
WBC: 5.5 10*3/uL (ref 4.0–10.5)
nRBC: 0 % (ref 0.0–0.2)

## 2018-07-25 LAB — BASIC METABOLIC PANEL
Anion gap: 6 (ref 5–15)
BUN: 14 mg/dL (ref 8–23)
CO2: 25 mmol/L (ref 22–32)
Calcium: 8.2 mg/dL — ABNORMAL LOW (ref 8.9–10.3)
Chloride: 101 mmol/L (ref 98–111)
Creatinine, Ser: 1.11 mg/dL (ref 0.61–1.24)
GFR calc Af Amer: >60 mL/min (ref >60)
GFR calc non Af Amer: >60 mL/min (ref >60)
Glucose, Bld: 97 mg/dL (ref 70–99)
Potassium: 3.9 mmol/L (ref 3.5–5.1)
Sodium: 132 mmol/L — ABNORMAL LOW (ref 135–145)

## 2018-07-25 LAB — URINE CULTURE: Culture: NO GROWTH

## 2018-07-25 LAB — MAGNESIUM: Magnesium: 1.9 mg/dL (ref 1.7–2.4)

## 2018-07-25 LAB — APTT: aPTT: 91 seconds — ABNORMAL HIGH (ref 24–36)

## 2018-07-25 LAB — HEPARIN LEVEL (UNFRACTIONATED): Heparin Unfractionated: 1.68 IU/mL — ABNORMAL HIGH (ref 0.30–0.70)

## 2018-07-25 NOTE — Progress Notes (Signed)
PROGRESS NOTE    [name]  INO:[id number] DOB: 1945/09/21 DOA: 07/23/2018 PCP: Garwin Brothers, MD   Brief Narrative:  [name] a 73 y.o.malewith medical history significant forCADs/p CABG x2 in 1984 and 1996,atrial fibrillation on Eliquis, chronic mesenteric ischemias/p SMA and celiac stenting 07/16/18, PVD s/p renal artery stenting 2005 and Rt CEA 2005,CKD stage2-3,GERD, PUD,peptic and esophageal strictures with previous esophageal dilatations,recentright hip fracture status postORIFwho presents to the hospital with severe heartburn, dysphagia, nausea, and occasional emesis likely due to underlying GI issue.  In ED, hemodynamically stable.  CBC remarkable for hemoglobin to 8.3 (baseline).  CMP with mild hyponatremia to 131 (about baseline).  Troponin 0 0.042.  BNP 1122 (no prior to compare to).  COVID-19 test negative.  CXR without acute finding.  Admitted for N/V/D/dysphagia.  Assessment & Plan:   Active Problems:   Essential hypertension   Hx of CABG '84, '96, last PCI 2012   Anemia   PVD (peripheral vascular disease) (HCC)   Hyperlipidemia LDL goal <70   PAF (paroxysmal atrial fibrillation) (HCC)   Coronary artery disease   Diastolic CHF ([place])   Chronic mesenteric ischemia (HCC)   Nausea and vomiting   Dysphagia  Dysphagia/odynophagia/GERD/nausea/vomiting in patient with known history of esophageal stricture status post dilation x7 -Evaluated by SLP who recommended GI evaluation. -[facility] consulted, appreciate recommendations -Continue IV Protonix twice daily -Continue Carafate -Initiate clear liquid diet (per SLP, recommending thin liquids and GI c/s)  Chronic diastolic CHF: SJ62-83%, mild MR, mild TR, and mild pulmonary hypertension by TTE6/20/2018.  Has 2+ pitting edema but no cardiopulmonary symptoms.  His edema could be due to malnutrition.  BNP elevated to 1120 but no baseline to compare to. -Resume home p.o. meds. -Hold home diuretics for  now. -Daily weight, intake output and renal function. -We will obtain TSH (wnl), prealbumin (19.2).   -Urinalysis without significant proteinuria (30 mg/dl).  Peripheral vascular disease/chronic mesenteric ischemia status post SMA and celiac stenting on 07/16/2018. -Continue home medications.  CAD status post CABG: Stable.  No anginal symptoms.  Troponin flat at 0.04.  EKG with A. fib, rate controlled, RBBB, LPFB and occasional PVCs but no acute ischemic finding. -Continue home medications.  Permanent A. fib without RVR. -Continue home meds. -Hold Eliquis.  IV heparin in case he needs GI procedure.  Anemia of chronic disease: Hgb at baseline. -Continue monitoring  Hypertension: Normotensive. -Continue home meds  Right hip fracture status post ORIF: Reports progressing well and ambulating with use of a walker at home now. -PT eval  DVT prophylaxis: heparin gtt Code Status: full  Family Communication: none at bedside Disposition Plan: pending GI evaluation   Consultants:   GI  Procedures:   none  Antimicrobials:  Anti-infectives (From admission, onward)   None     Subjective: C/o difficulty swallowing Liquids and solids Hx of multiple dilatations in past.  Objective: Vitals:   07/25/18 0621 07/25/18 1020 07/25/18 1131 07/25/18 1231  BP: (!) 130/52 100/64 (!) 98/55 (!) 94/58  Pulse: 83 96 90 90  Resp:   16   Temp:   98.4 F (36.9 C)   TempSrc:   Oral   SpO2:  99% 100% 100%  Weight:      Height:        Intake/Output Summary (Last 24 hours) at 07/25/2018 1532 Last data filed at 07/25/2018 1020 Gross per 24 hour  Intake 364 ml  Output 1851 ml  Net -1487 ml   [employer]  07/24/18 0040 07/25/18 0550  Weight: 62 kg 60.3 kg    Examination:  General exam: Appears calm and comfortable  Respiratory system: Clear to auscultation. Respiratory effort normal. Cardiovascular system: S1 & S2 heard, RRR. Gastrointestinal system: Abdomen is  nondistended, soft and nontender.  Central nervous system: Alert and oriented. No focal neurological deficits. Extremities: moving all extremities Skin: No rashes, lesions or ulcers Psychiatry: Judgement and insight appear normal. Mood & affect appropriate.     Data Reviewed: I have personally reviewed following labs and imaging studies  CBC: Recent Labs  Lab 07/19/18 0246 07/20/18 0758 07/23/18 2045 07/24/18 0723 07/25/18 0505  WBC 6.5 7.9 9.5 8.4 5.5  NEUTROABS  --  5.9 8.3*  --   --   HGB 7.9* 8.4* 8.3* 8.3* 7.5*  HCT 23.0* 25.3* 25.5* 24.9* 22.5*  MCV 93.1 95.1 99.2 96.9 97.8  PLT 182 202 223 216 469   Basic Metabolic Panel: Recent Labs  Lab 07/19/18 0246 07/20/18 0758 07/23/18 2045 07/24/18 0723 07/25/18 0505  NA 130* 130* 131* 129* 132*  K 4.3 4.3 3.7 4.3 3.9  CL 99 100 98 97* 101  CO2 23 24 23 23 25   GLUCOSE 123* 131* 116* 97 97  BUN 25* 24* 20 20 14   CREATININE 1.29* 1.27* 1.05 1.15 1.11  CALCIUM 8.3* 8.4* 8.4* 8.4* 8.2*  MG  --   --  1.8  --  1.9   GFR: Estimated Creatinine Clearance: 49.8 mL/min (by C-G formula based on SCr of 1.11 mg/dL). Liver Function Tests: Recent Labs  Lab 07/20/18 0758 07/23/18 2045  AST 15 15  ALT 14 12  ALKPHOS 83 82  BILITOT 0.9 1.2  PROT 5.5* 5.3*  ALBUMIN 2.8* 2.7*   No results for input(s): LIPASE, AMYLASE in the last 168 hours. No results for input(s): AMMONIA in the last 168 hours. Coagulation Profile: No results for input(s): INR, PROTIME in the last 168 hours. Cardiac Enzymes: Recent Labs  Lab 07/23/18 2045 07/24/18 0219 07/24/18 0723  TROPONINI 0.04* 0.04* 0.04*   BNP (last 3 results) No results for input(s): PROBNP in the last 8760 hours. HbA1C: No results for input(s): HGBA1C in the last 72 hours. CBG: No results for input(s): GLUCAP in the last 168 hours. Lipid Profile: No results for input(s): CHOL, HDL, LDLCALC, TRIG, CHOLHDL, LDLDIRECT in the last 72 hours. Thyroid Function Tests: Recent  Labs    07/24/18 1543  TSH 0.857   Anemia Panel: No results for input(s): VITAMINB12, FOLATE, FERRITIN, TIBC, IRON, RETICCTPCT in the last 72 hours. Sepsis Labs: No results for input(s): PROCALCITON, LATICACIDVEN in the last 168 hours.  Recent Results (from the past 240 hour(s))  SARS Coronavirus 2 (CEPHEID - Performed in [place] hospital lab), Hosp Order     Status: None   Collection Time: 07/23/18  8:45 PM  Result Value Ref Range Status   SARS Coronavirus 2 NEGATIVE NEGATIVE Final    Comment: (NOTE) If result is NEGATIVE SARS-CoV-2 target nucleic acids are NOT DETECTED. The SARS-CoV-2 RNA is generally detectable in upper and lower  respiratory specimens during the acute phase of infection. The lowest  concentration of SARS-CoV-2 viral copies this assay can detect is 250  copies / mL. A negative result does not preclude SARS-CoV-2 infection  and should not be used as the sole basis for treatment or other  patient management decisions.  A negative result may occur with  improper specimen collection / handling, submission of specimen other  than nasopharyngeal swab,  presence of viral mutation(s) within the  areas targeted by this assay, and inadequate number of viral copies  (<250 copies / mL). A negative result must be combined with clinical  observations, patient history, and epidemiological information. If result is POSITIVE SARS-CoV-2 target nucleic acids are DETECTED. The SARS-CoV-2 RNA is generally detectable in upper and lower  respiratory specimens dur ing the acute phase of infection.  Positive  results are indicative of active infection with SARS-CoV-2.  Clinical  correlation with patient history and other diagnostic information is  necessary to determine patient infection status.  Positive results do  not rule out bacterial infection or co-infection with other viruses. If result is PRESUMPTIVE POSTIVE SARS-CoV-2 nucleic acids MAY BE PRESENT.   A presumptive  positive result was obtained on the submitted specimen  and confirmed on repeat testing.  While 2019 novel coronavirus  (SARS-CoV-2) nucleic acids may be present in the submitted sample  additional confirmatory testing may be necessary for epidemiological  and / or clinical management purposes  to differentiate between  SARS-CoV-2 and other Sarbecovirus currently known to infect humans.  If clinically indicated additional testing with an alternate test  methodology [phone]) is advised. The SARS-CoV-2 RNA is generally  detectable in upper and lower respiratory sp ecimens during the acute  phase of infection. The expected result is Negative. Fact Sheet for Patients:  StrictlyIdeas.no Fact Sheet for Healthcare Providers: BankingDealers.co.za This test is not yet approved or cleared by the [place] FDA and has been authorized for detection and/or diagnosis of SARS-CoV-2 by FDA under an Emergency Use Authorization (EUA).  This EUA will remain in effect (meaning this test can be used) for the duration of the COVID-19 declaration under Section 564(b)(1) of the Act, 21 U.S.C. section 360bbb-3(b)(1), unless the authorization is terminated or revoked sooner. Performed at [facility], [place] [address] [place], [place] [address]   Urine culture     Status: None   Collection Time: 07/24/18  1:08 AM  Result Value Ref Range Status   Specimen Description URINE, CATHETERIZED  Final   Special Requests STERILE CONTAINER  Final   Culture   Final    NO GROWTH Performed at [facility], [address] [place], [place] [address]    Report Status 07/25/2018 FINAL  Final         Radiology Studies: Dg Chest Portable 1 View  Result Date: 07/23/2018 CLINICAL DATA:  Weakness, fatigue, lower extremity swelling. EXAM: PORTABLE CHEST 1 VIEW COMPARISON:  CT abdomen pelvis dated Jul 13, 2018. CT chest and chest x-ray dated Jul 12, 2018.  FINDINGS: The patient remains rotated to the right. Stable cardiomediastinal silhouette status post CABG. Normal pulmonary vascularity. Small right pleural effusion with mild right basilar atelectasis, unchanged since prior CT abdomen from 07/13/2018. No consolidation or pneumothorax. No acute osseous abnormality. IMPRESSION: 1. Unchanged small right pleural effusion. Electronically Signed   By: Titus Dubin M.D.   On: 07/23/2018 21:08        Scheduled Meds: . aspirin EC  81 mg Oral Daily  . diltiazem  30 mg Oral Q6H  . feeding supplement  1 Container Oral TID BM  . ketotifen  1 drop Right Eye Daily  . multivitamin with minerals  1 tablet Oral Daily  . pantoprazole (PROTONIX) IV  40 mg Intravenous Q12H  . sodium chloride flush  3 mL Intravenous Q12H   Continuous Infusions: . heparin 1,100 Units/hr (07/25/18 0445)     LOS: 2  days    Time spent: over 30 min    Fayrene Helper, MD [facility] Pager AMION  If 7PM-7AM, please contact night-coverage [email or web address] Password [facility] 07/25/2018, 3:32 PM

## 2018-07-25 NOTE — TOC Progression Note (Signed)
Transition of Care [facility]) - Progression Note    Patient Details  Name: [name] MRN: [id number] Date of Birth: 06/14/1945  Transition of Care [facility]) CM/SW Contact  Sherrilyn Rist Phone Number: [phone] 07/25/2018, 2:35 PM  Clinical Narrative:    07/25/2018 - Patient is active with [facility] for [facility] services as prior to admission; CM will continue to follow for progression of care.  07/18/2018 - Clinical Narrative:                 Patient lives at home with spouse; PQZ:RAQT, Marlene Lard, MD; has private insurance with [facility]; patient states that he is active with [facility] for [facility] services as prior to admission. Cory with [facility] called. Mindi Slicker RN,MHA,BSN        Expected Discharge Plan and Services                                                 Social Determinants of Health (SDOH) Interventions    Readmission Risk Interventions No flowsheet data found.

## 2018-07-25 NOTE — Progress Notes (Signed)
Nutrition Follow-up  DOCUMENTATION CODES:   Non-severe (moderate) malnutrition in context of chronic illness  INTERVENTION:   -Continue Boost Breeze po TID, each supplement provides 250 kcal and 9 grams of protein -Continue MVI with minerals daily -RD will follow for diet advancement and adjust supplement regimen as appropriate  NUTRITION DIAGNOSIS:   Moderate Malnutrition related to chronic illness(dypshagia related to possible esophageal stricture) as evidenced by energy intake < or equal to 75% for > or equal to 1 month, mild fat depletion, moderate fat depletion, mild muscle depletion, moderate muscle depletion.  Ongoing  GOAL:   Patient will meet greater than or equal to 90% of their needs  Progressing  MONITOR:   PO intake, Supplement acceptance, Diet advancement, Labs, Weight trends, Skin, I & O's  REASON FOR ASSESSMENT:   Malnutrition Screening Tool    ASSESSMENT:   [name] is a 73 y.o. male with medical history significant for CAD s/p CABG x2 in 1984 and 1996, atrial fibrillation on Eliquis, chronic mesenteric ischemia s/p SMA and celiac stenting 07/16/18, PVD s/p renal artery stenting 2005 and Rt CEA 2005, CKD stage 2-3, GERD, PUD, peptic and esophageal strictures with previous esophageal dilatations, recent right hip fracture status post ORIF who presents to the hospital with severe heartburn, dysphagia, nausea, and occasional emesis.  Reviewed I/O's: -478 ml x 24 hours and -1.1 L since admission  UOP: 1.1 L x 24 hours  Spoke with pt at bedside, who remains in good spirits today. He reports continued pain with swallowing, which has been stable since yesterday. Pt consumed about 50% of juice and broth off breakfast tray. He reports he is waiting for GI eval today.   Pt enjoys the Boost Breeze supplements and is tolerating well. He is amenable to continue these.   Labs reviewed: Na: 132.   NUTRITION - FOCUSED PHYSICAL EXAM:    Most Recent Value  Orbital  Region  Mild depletion  Upper Arm Region  Moderate depletion  Thoracic and Lumbar Region  Mild depletion  Buccal Region  Moderate depletion  Temple Region  Moderate depletion  Clavicle Bone Region  Moderate depletion  Clavicle and Acromion Bone Region  Moderate depletion  Scapular Bone Region  Mild depletion  Dorsal Hand  Moderate depletion  Patellar Region  Moderate depletion  Anterior Thigh Region  Moderate depletion  Posterior Calf Region  Moderate depletion  Edema (RD Assessment)  None  Hair  Reviewed  Eyes  Reviewed  Mouth  Reviewed  Skin  Reviewed  Nails  Reviewed       Diet Order:   Diet Order            Diet clear liquid Room service appropriate? Yes with Assist; Fluid consistency: Thin  Diet effective now              EDUCATION NEEDS:   Education needs have been addressed  Skin:  Skin Assessment: Skin Integrity Issues: Skin Integrity Issues:: Incisions Incisions: closed rt hip and lt groin  Last BM:  07/22/18  Height:   Ht Readings from Last 1 Encounters:  07/24/18 5\' 5"  (1.651 m)    Weight:   Wt Readings from Last 1 Encounters:  07/25/18 60.3 kg    Ideal Body Weight:  59.1 kg  BMI:  Body mass index is 22.13 kg/m.  Estimated Nutritional Needs:   Kcal:  1850-2050  Protein:  90-105 grams  Fluid:  > 1.8 L    [name] A. Jimmye Norman, RD, LDN, [place] Registered  Dietitian II Certified Diabetes Care and Education Specialist Pager: [phone] After hours Pager: [phone]

## 2018-07-25 NOTE — Progress Notes (Addendum)
Physical Therapy Treatment Patient Details Name: [name] MRN: [id number] DOB: 1945/04/12 Today's Date: 07/25/2018    History of Present Illness 73 yo male admitted with N/V edema pt with CHf and possible esophageal stenosis. PMhx: Rt hip fx with ORIF april 2020, CAD, CABG, AFib, mesenteric ischemia, PVD, Rt CEA, CKD, NSTEMI May 2020    PT Comments    Pt admitted with above diagnosis. Pt currently with functional limitations due to the deficits listed below (see PT Problem List). Pt was able to ambulate to desk with RW with incr time and min guard assist. Cues needed for posture as he stays flexed somewhat which he says is premorbid.  Some pain but pt pushed through.   HR 96-115 bpm.  Pt will benefit from skilled PT to increase their independence and safety with mobility to allow discharge to the venue listed below.     Follow Up Recommendations  Home health PT;Supervision for mobility/OOB     Equipment Recommendations  None recommended by PT    Recommendations for Other Services       Precautions / Restrictions Precautions Precautions: Fall Restrictions Weight Bearing Restrictions: No RLE Weight Bearing: Weight bearing as tolerated    Mobility  Bed Mobility Overal bed mobility: Modified Independent Bed Mobility: Supine to Sit     Supine to sit: Supervision     General bed mobility comments: increased time and struggle to move legs to EOB, normally sleeps in recliner due to reflux  Transfers Overall transfer level: Needs assistance Equipment used: Rolling walker (2 wheeled) Transfers: Sit to/from Stand Sit to Stand: Supervision         General transfer comment: cues for hand placement, increased time  Ambulation/Gait Ambulation/Gait assistance: Supervision Gait Distance (Feet): 125 Feet Assistive device: Rolling walker (2 wheeled) Gait Pattern/deviations: Step-through pattern;Decreased stride length;Trunk flexed   Gait velocity interpretation: <1.8 ft/sec,  indicate of risk for recurrent falls General Gait Details: slow steady gait, limited by fatigue   Stairs             Wheelchair Mobility    Modified Rankin (Stroke Patients Only)       Balance Overall balance assessment: Mild deficits observed, not formally tested                                          Cognition Arousal/Alertness: Awake/alert Behavior During Therapy: WFL for tasks assessed/performed Overall Cognitive Status: Within Functional Limits for tasks assessed                                        Exercises General Exercises - Lower Extremity Long Arc Quad: AAROM;10 reps;Seated;Right Hip ABduction/ADduction: AAROM;10 reps;Seated;Right Hip Flexion/Marching: AAROM;10 reps;Seated;Right    General Comments        Pertinent Vitals/Pain Pain Assessment: Faces Faces Pain Scale: Hurts even more Pain Location: left groin incision and right hip Pain Descriptors / Indicators: Sore Pain Intervention(s): Limited activity within patient's tolerance;Monitored during session;Repositioned;Patient requesting pain meds-RN notified    Home Living                      Prior Function            PT Goals (current goals can now be found in the care plan section) Acute Rehab  PT Goals Patient Stated Goal: return home Progress towards PT goals: Progressing toward goals    Frequency    Min 3X/week      PT Plan Current plan remains appropriate    Co-evaluation              AM-PAC PT "6 Clicks" Mobility   Outcome Measure  Help needed turning from your back to your side while in a flat bed without using bedrails?: A Little Help needed moving from lying on your back to sitting on the side of a flat bed without using bedrails?: A Little Help needed moving to and from a bed to a chair (including a wheelchair)?: A Little Help needed standing up from a chair using your arms (e.g., wheelchair or bedside chair)?: A  Little Help needed to walk in hospital room?: A Little Help needed climbing 3-5 steps with a railing? : A Little 6 Click Score: 18    End of Session Equipment Utilized During Treatment: Gait belt Activity Tolerance: Patient tolerated treatment well Patient left: in chair;with call bell/phone within reach;with chair alarm set Nurse Communication: Mobility status PT Visit Diagnosis: Other abnormalities of gait and mobility (R26.89);Difficulty in walking, not elsewhere classified (R26.2);Muscle weakness (generalized) (M62.81)     Time: 0865-7846 PT Time Calculation (min) (ACUTE ONLY): 20 min  Charges:  $Gait Training: 8-22 mins                     [place] Pager:  [phone]  Office:  [place] 07/25/2018, 10:29 AM

## 2018-07-25 NOTE — Progress Notes (Signed)
Patient BP is 94/58 - pt due for Cardizem.  Paged MD- per MD will hold cardizem for now and recheck in 30 min -1 hr.

## 2018-07-25 NOTE — Consult Note (Signed)
[facility] Consult: 3:42 PM 07/25/2018  LOS: 2 days    Referring Provider: Dr Janyce Llanos  Primary Care Physician:  Garwin Brothers, MD Primary Gastroenterologist:  Dr. Henrene Pastor     Reason for Consultation: Swallowing troubles.   HPI: [name] is a 73 y.o. male.  Hx PVD.  CAD, MIs.  S/p CABG 1984, redo CABG 1996.  Multiple PCI's.  AF.  Anemia.  Takes 81 ASA, Eliquis, BID Nexium, previously took Plavix.   GERD, erosive esophagitis, peptic ulcer, peptic stricture, esophageal strictures, previous esophageal dilatations. 02/2010 EGD.  Evaluation abdominal pain. Benign appearing distal esophageal stricture.  Small pyloric channel ulcer. 02/2011 EGD.  For follow-up of gastric ulcer.  Distal esophageal stricture.  Tiny pyloric ulcer, overall improved appearance.  Pyloric stenosis.  Hiatal hernia. 04/2014 Colonoscopy.  Adenoma surveillance, adenomas found 2011.  3 polyps (Tubular adenoms without HGD) removed.  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  Patient was okay to resume Plavix and recommended iron sulfate 325 mg daily to reduce risk of anemia.  Plan repeat colonoscopy 04/2019.  Underwent ORIF right hip fracture at [facility] and was discharged on 07/07/2018.  Admitted to [facility] on transfer from [facility] on 5/14 with severe SMA stenosis symptomatic with abdominal pain, fever.  Underwent stenting of SMA and celiac by Dr. Oneida Alar. Eliquis was restarted on 5/13. Seen on 5/15 for worsening dysphagia.  He was having postprandial burning, regurgitation/dry heaves but there was no nausea. Home PPI was Nexium BID, but receiving Protonix 40/day.  Mild relief of symptoms with Mylanta and viscous lidocaine. Case discussed with Dr. Jerilynn Mages and Protonix increased to 40mg  BID.   No plans for repeating  upper endoscopy. Discharged home on 5/16.   Patient readmitted 5/18 ongoing swallowing troubles, decreased p.o. intake and weakness, lethargy. What he describes is very slow transit through the esophagus which is uncomfortable and burns when he swallows.  They gag as he swallows but is not regurgitating very often.  Ultimately soft foods and liquids are able to pass. Since arrival receiving IV Protonix 40 BID and prn Carafate. He has been on IV heparin and Eliquis is on hold.   Past Medical History:  Diagnosis Date  . Anemia   . Angina   . Atrial fibrillation ([place])   . Blood transfusion   . Bronchitis   . Bulging discs    "8 of them; thoracic, lumbar, sacral area"  . Chronic back pain   . Colon polyps   . Complication of anesthesia   . Coronary artery disease 01/24/11   Successful PCI long segmental stensois  vein graft to the CX marginal vessel-graft previously stented prox. 3.0x35mm TAXUS stent mid seg 3.0x55mm TAXUS stent now tandem stens of 3.0x68mm & 3.0x37mm placed with the seg. 50,80 & 99% stenosis reduced to 0%  . DDD (degenerative disc disease)   . Diastolic heart failure ([place])   . Dysrhythmia    "PAC's and PVC's"  . Esophageal dilatation    "have had it done 7 times; last time ~ 2001"  .  Esophageal stricture   . GERD (gastroesophageal reflux disease)   . Headache(784.0)   . Hiatal hernia   . Hyperlipidemia   . Myocardial infarction ([place]) 1991  . Myocardial infarction [facility]) 2005   "had 3 heart attacks this year"  . Paroxysmal atrial fibrillation (HCC)   . Pyloric stenosis   . RBBB   . Renal artery stenosis (HCC)    hhistory of right renal artery stenting and re-intervention for "in-stent restenosis.  . S/P CABG (coronary artery bypass graft) [place]  . Shortness of breath    "when I had the heart attacks"  . Subclavian artery stenosis, left ([place])   . Ulcer    "small; Dr. Henrene Pastor found it 02/2010"    Past Surgical History:   Procedure Laterality Date  . ADENOIDECTOMY     "when I was an infant"  . APPENDECTOMY  1953  . CARDIOVERSION N/A 04/17/2017   Procedure: CARDIOVERSION;  Surgeon: Jerline Pain, MD;  Location: [facility] ENDOSCOPY;  Service: Cardiovascular;  Laterality: N/A;  . CAROTID ENDARTERECTOMY Right [place]  01/24/11   "today makes a total of 9 stents"  . CORONARY ARTERY BYPASS GRAFT  1984   CABG X 4  . CORONARY ARTERY BYPASS GRAFT  1996   CABG X 4  . LEFT HEART CATHETERIZATION WITH CORONARY/GRAFT ANGIOGRAM N/A 01/24/2011   Procedure: LEFT HEART CATHETERIZATION WITH Beatrix Fetters;  Surgeon: Troy Sine, MD;  Location: [facility] CATH LAB;  Service: Cardiovascular;  Laterality: N/A;  . PERIPHERAL VASCULAR INTERVENTION  07/16/2018   Procedure: PERIPHERAL VASCULAR INTERVENTION;  Surgeon: Elam Dutch, MD;  Location: [place] CV LAB;  Service: Cardiovascular;;  SMA and Celiac  . pylondial cyst removal    . RETINAL DETACHMENT SURGERY  04/2007   right  . TONSILLECTOMY  1972  . VISCERAL ANGIOGRAPHY N/A 07/16/2018   Procedure: VISCERAL ANGIOGRAPHY;  Surgeon: Elam Dutch, MD;  Location: [place] CV LAB;  Service: Cardiovascular;  Laterality: N/A;    Prior to Admission medications   Medication Sig Start Date End Date Taking? Authorizing Provider  aspirin EC 81 MG EC tablet Take 1 tablet (81 mg total) by mouth daily. 07/20/18  Yes Kayleen Memos, DO  diltiazem (CARDIZEM) 30 MG tablet Take one tablet by mouth every 4 hours AS NEEDED for A-fib HR > 100 as long as BP > 100. 06/30/17  Yes Sherran Needs, NP  dutasteride (AVODART) 0.5 MG capsule Take 0.5 mg by mouth daily.  03/22/14  Yes [provider]  ELIQUIS 5 MG TABS tablet TAKE 1 TABLET BY MOUTH TWICE A DAY Patient taking differently: Take 5 mg by mouth 2 (two) times daily.  05/14/18  Yes Camnitz, Will Hassell Done, MD  ezetimibe (ZETIA) 10 MG tablet TAKE 1 TABLET (10 MG TOTAL) BY MOUTH DAILY.  Patient taking differently: Take 10 mg by mouth daily.  03/05/18  Yes Troy Sine, MD  Ketotifen Fumarate (ALAWAY OP) Place 1 drop into the right eye daily.   Yes [provider]  loratadine (CLARITIN) 10 MG tablet Take 10 mg by mouth daily as needed for allergies, rhinitis or itching.  12/12/13  Yes [provider]  lovastatin (MEVACOR) 40 MG tablet Take 20 mg by mouth at bedtime.   Yes [provider]  metoprolol tartrate (LOPRESSOR) 100 MG tablet Take 1 tablet (100 mg total) by mouth 2 (two) times daily.  07/19/18  Yes Kayleen Memos, DO  Multiple Vitamins-Minerals (MULTIVITAMINS THER. W/MINERALS) TABS Take 1 tablet by mouth daily.     Yes [provider]  nitroGLYCERIN (NITROLINGUAL) 0.4 MG/SPRAY spray Place 1 spray under the tongue every 5 (five) minutes x 3 doses as needed for chest pain. 01/01/18  Yes [name], MD  pantoprazole (PROTONIX) 40 MG tablet Take 1 tablet (40 mg total) by mouth 2 (two) times daily. 07/21/18  Yes Kayleen Memos, DO  ranolazine (RANEXA) 1000 MG SR tablet Take 500 mg by mouth 2 (two) times daily.   Yes [provider]  sucralfate (CARAFATE) 1 GM/10ML suspension Take 10 mLs (1 g total) by mouth daily as needed (esophageal burning). 07/21/18  Yes Kayleen Memos, DO  torsemide (DEMADEX) 20 MG tablet Take 20 mg by mouth as needed (for fluid). FOR SWELLING OR WEIGHT GAIN   Yes [provider]  vitamin E 400 UNIT capsule Take 400 Units by mouth daily.    Yes [provider]    Scheduled Meds: . aspirin EC  81 mg Oral Daily  . diltiazem  30 mg Oral Q6H  . feeding supplement  1 Container Oral TID BM  . ketotifen  1 drop Right Eye Daily  . multivitamin with minerals  1 tablet Oral Daily  . pantoprazole (PROTONIX) IV  40 mg Intravenous Q12H  . sodium chloride flush  3 mL Intravenous Q12H   Infusions: . heparin 1,100 Units/hr (07/25/18 0445)   PRN Meds: acetaminophen **OR** acetaminophen,  hydrALAZINE, ondansetron **OR** ondansetron (ZOFRAN) IV, sucralfate   Allergies as of 07/23/2018 - Review Complete 07/23/2018  Allergen Reaction Noted  . Codeine Nausea And Vomiting 01/22/2014  . Sulfa antibiotics Rash 01/30/2008  . Sulfonamide derivatives Rash 01/30/2008    Family History  Problem Relation Age of Onset  . Breast cancer Paternal Aunt   . Diabetes Sister   . Ulcers Father   . Heart disease Father   . Heart failure Father   . Heart attack Father   . AAA (abdominal aortic aneurysm) Father   . Stroke Mother   . Hypertension Mother   . Colon cancer Neg Hx     Social History   Socioeconomic History  . Marital status: Married    Spouse name: Not on file  . Number of children: Not on file  . Years of education: Not on file  . Highest education level: Not on file  Occupational History  . Not on file  Social Needs  . Financial resource strain: Not hard at all  . Food insecurity:    Worry: Never true    Inability: Never true  . Transportation needs:    Medical: No    Non-medical: No  Tobacco Use  . Smoking status: Former Smoker    Packs/day: 1.00    Years: 38.00    Pack years: 38.00    Types: Cigarettes    Last attempt to quit: 02/09/2005    Years since quitting: 13.4  . Smokeless tobacco: Never Used  . Tobacco comment: "quit smoking cigarrettes 2006"  Substance and Sexual Activity  . Alcohol use: No    Alcohol/week: 0.0 standard drinks  . Drug use: No  . Sexual activity: Not on file  Lifestyle  . Physical activity:    Days per week: 0 days    Minutes per session: 0 min  . Stress: Only a little  Relationships  . Social connections:    Talks on phone: Not  on file    Gets together: Not on file    Attends religious service: Not on file    Active member of club or organization: No    Attends meetings of clubs or organizations: Never    Relationship status: Married  . Intimate partner violence:    Fear of current or ex partner: No    Emotionally  abused: No    Physically abused: No    Forced sexual activity: No  Other Topics Concern  . Not on file  Social History Narrative  . Not on file    REVIEW OF SYSTEMS: Constitutional: Feels tired and weak. ENT:  No nose bleeds Pulm: No difficulty breathing.  Occasional cough. CV:  No palpitations, no LE edema.  GU:  No hematuria, no frequency.  Urinary retention requiring in and out Foley catheter when he was first admitted but now urinating freely. GI: No abdominal pain.  No nausea.  Having brown stools. Heme: Unusual bleeding or bruising.   Transfusions: None Neuro:  No headaches, no peripheral tingling or numbness.  Syncope. Derm:  No itching, no rash or sores.  Endocrine:  No sweats or chills.  No polyuria or dysuria Immunization: Not queried. Travel:  None beyond local counties in last few months.    PHYSICAL EXAM: Vital signs in last 24 hours: Vitals:   07/25/18 1131 07/25/18 1231  BP: (!) 98/55 (!) 94/58  Pulse: 90 90  Resp: 16   Temp: 98.4 F (36.9 C)   SpO2: 100% 100%   Wt Readings from Last 3 Encounters:  07/25/18 60.3 kg  07/21/18 61.1 kg  05/25/18 58.5 kg    General: Frail, somewhat cachectic and ill appearing WM.  He is alert and provides a good history. Head: No facial asymmetry or swelling.  No signs of head trauma. Eyes: No scleral icterus.  No conjunctival pallor. Ears: Not HOH Nose: No discharge or congestion Mouth: Tongue midline.  Oral mucosa pink, moist, clear. Neck: No JVD, masses, bruits, hernias. Lungs: Clear Heart: RRR.  No MRG. Abdomen: Soft.  Not tender or distended.  Thin.  No HSM, masses.  Active bowel sounds..   Rectal: Deferred Musc/Skeltl: No joint redness, swelling, gross deformity. Extremities: Slight bilateral pedal edema. Neurologic: Alert.  Oriented x3.  No tremor, no limb weakness. Skin: No rashes, no sores. Nodes: No cervical adenopathy. Psych: Cooperative, pleasant, affect a bit flat.  Intake/Output from previous day:  05/19 0701 - 05/20 0700 In: 624 [P.O.:620; I.V.:4] Out: 1102 [Urine:1100; Stool:2] Intake/Output this shift: Total I/O In: 240 [P.O.:240] Out: 1050 [Urine:1050]  LAB RESULTS: Recent Labs    07/23/18 2045 07/24/18 0723 07/25/18 0505  WBC 9.5 8.4 5.5  HGB 8.3* 8.3* 7.5*  HCT 25.5* 24.9* 22.5*  PLT 223 216 190   BMET Lab Results  Component Value Date   NA 132 (L) 07/25/2018   NA 129 (L) 07/24/2018   NA 131 (L) 07/23/2018   K 3.9 07/25/2018   K 4.3 07/24/2018   K 3.7 07/23/2018   CL 101 07/25/2018   CL 97 (L) 07/24/2018   CL 98 07/23/2018   CO2 25 07/25/2018   CO2 23 07/24/2018   CO2 23 07/23/2018   GLUCOSE 97 07/25/2018   GLUCOSE 97 07/24/2018   GLUCOSE 116 (H) 07/23/2018   BUN 14 07/25/2018   BUN 20 07/24/2018   BUN 20 07/23/2018   CREATININE 1.11 07/25/2018   CREATININE 1.15 07/24/2018   CREATININE 1.05 07/23/2018   CALCIUM 8.2 (L) 07/25/2018  CALCIUM 8.4 (L) 07/24/2018   CALCIUM 8.4 (L) 07/23/2018   LFT Recent Labs    07/23/18 2045  PROT 5.3*  ALBUMIN 2.7*  AST 15  ALT 12  ALKPHOS 82  BILITOT 1.2   PT/INR Lab Results  Component Value Date   INR 1.08 01/24/2011   Hepatitis Panel No results for input(s): HEPBSAG, HCVAB, HEPAIGM, HEPBIGM in the last 72 hours. C-Diff No components found for: CDIFF Lipase     Component Value Date/Time   LIPASE 10.0 (L) 08/06/2009 1601    Drugs of Abuse  No results found for: LABOPIA, COCAINSCRNUR, LABBENZ, AMPHETMU, THCU, LABBARB   RADIOLOGY STUDIES: Dg Chest Portable 1 View  Result Date: 07/23/2018 CLINICAL DATA:  Weakness, fatigue, lower extremity swelling. EXAM: PORTABLE CHEST 1 VIEW COMPARISON:  CT abdomen pelvis dated Jul 13, 2018. CT chest and chest x-ray dated Jul 12, 2018. FINDINGS: The patient remains rotated to the right. Stable cardiomediastinal silhouette status post CABG. Normal pulmonary vascularity. Small right pleural effusion with mild right basilar atelectasis, unchanged since prior CT  abdomen from 07/13/2018. No consolidation or pneumothorax. No acute osseous abnormality. IMPRESSION: 1. Unchanged small right pleural effusion. Electronically Signed   By: Titus Dubin M.D.   On: 07/23/2018 21:08      IMPRESSION:   *    Dysphagia/odynophagia in patient with history of esophageal strictures dilated in the past.  Current symptoms reminiscent of symptoms associated with esophageal strictures in the past.  *    Mesenteric ischemia and colitis, SMA stenosis.  Extensive peripheral vascular disease.  07/16/18 stenting of SMA and celiac. Patient is moving his bowels and having no abdominal pain.  *    Hyponatremia, improved.  *    Normocytic anemia.  Received 1 PRBC during recent hospitalization.  Hgb now 7.5, was 8.4 on day of discharge 5/15, 5 days ago..  *    Mild elevated troponin I to 0.04.  Elevated BNP 1122.  NSTEMI associated with demand ischemia during recent hospitalization.  Previous CABGs and PCI's.  Cardiology planned no further evaluation. Chest x-ray 5/18 shows stable, small, right pleural effusion.  *    Persistent atrial fib.  Eliquis restarted 5/13 but has not been receiving this since admission.  Currently receiving IV heparin,  low-dose aspirin continues.  *   Right hip fracture ORIF repair first week of 07/2018.    PLAN:     *   EGD with esophageal dilatation at 10:15 AM.  Stop heparin at 0600.  Continue clears for now, n.p.o. after midnight.   Azucena Freed  07/25/2018, 3:42 PM Phone [phone]  Attending Attestation:  I have independently seen and evaluated the patient today. No family was present at the time of my evaluation. I discussed the patient's care with PA Gribbin and I agree with her documentation above.   EGD +/- dilation recommended for further evaluation of his symptoms.    I consented the patient at the bedside today discussing the risks, benefits, and alternatives to endoscopic evaluation. In particular, we discussed the risks  that include, but are not limited to, reaction to medication, cardiopulmonary compromise, bleeding requiring blood transfusion, aspiration resulting in pneumonia, perforation requiring surgery, lack of diagnosis, severe illness requiring hospitalization, and even death. We reviewed the risk of missed lesion including polyps or even cancer. The patient acknowledges these risks and asks that we proceed.  NPO at midnight in preparation for the procedure tomorrow. NPO at midnight recommended. Will hold heparin at 6am in  preparation for the procedure.   Thornton Park, MD, MPH [facility]

## 2018-07-26 ENCOUNTER — Inpatient Hospital Stay (HOSPITAL_COMMUNITY): Payer: Medicare Other | Admitting: Certified Registered Nurse Anesthetist

## 2018-07-26 ENCOUNTER — Encounter (HOSPITAL_COMMUNITY)
Admission: EM | Disposition: A | Discharge status: Home / Self Care | Payer: Self-pay | Source: Home / Self Care | Attending: Internal Medicine

## 2018-07-26 ENCOUNTER — Inpatient Hospital Stay (HOSPITAL_COMMUNITY): Payer: Medicare Other

## 2018-07-26 ENCOUNTER — Encounter (HOSPITAL_COMMUNITY): Payer: Self-pay

## 2018-07-26 DIAGNOSIS — R12 Heartburn: Secondary | ICD-10-CM

## 2018-07-26 DIAGNOSIS — K222 Esophageal obstruction: Secondary | ICD-10-CM

## 2018-07-26 DIAGNOSIS — I724 Aneurysm of artery of lower extremity: Secondary | ICD-10-CM

## 2018-07-26 DIAGNOSIS — K311 Adult hypertrophic pyloric stenosis: Principal | ICD-10-CM

## 2018-07-26 DIAGNOSIS — K2210 Ulcer of esophagus without bleeding: Secondary | ICD-10-CM

## 2018-07-26 HISTORY — PX: ESOPHAGOGASTRODUODENOSCOPY (EGD) WITH PROPOFOL: SHX5813

## 2018-07-26 HISTORY — PX: BIOPSY: SHX5522

## 2018-07-26 LAB — COMPREHENSIVE METABOLIC PANEL
ALT: 10 U/L (ref 0–44)
AST: 16 U/L (ref 15–41)
Albumin: 2.8 g/dL — ABNORMAL LOW (ref 3.5–5.0)
Alkaline Phosphatase: 84 U/L (ref 38–126)
Anion gap: 8 (ref 5–15)
BUN: 11 mg/dL (ref 8–23)
CO2: 24 mmol/L (ref 22–32)
Calcium: 8.4 mg/dL — ABNORMAL LOW (ref 8.9–10.3)
Chloride: 102 mmol/L (ref 98–111)
Creatinine, Ser: 1.06 mg/dL (ref 0.61–1.24)
GFR calc Af Amer: >60 mL/min (ref >60)
GFR calc non Af Amer: >60 mL/min (ref >60)
Glucose, Bld: 100 mg/dL — ABNORMAL HIGH (ref 70–99)
Potassium: 3.8 mmol/L (ref 3.5–5.1)
Sodium: 134 mmol/L — ABNORMAL LOW (ref 135–145)
Total Bilirubin: 1 mg/dL (ref 0.3–1.2)
Total Protein: 5.3 g/dL — ABNORMAL LOW (ref 6.5–8.1)

## 2018-07-26 LAB — CBC
HCT: 23.6 % — ABNORMAL LOW (ref 39.0–52.0)
Hemoglobin: 8 g/dL — ABNORMAL LOW (ref 13.0–17.0)
MCH: 33.2 pg (ref 26.0–34.0)
MCHC: 33.9 g/dL (ref 30.0–36.0)
MCV: 97.9 fL (ref 80.0–100.0)
Platelets: 191 10*3/uL (ref 150–400)
RBC: 2.41 MIL/uL — ABNORMAL LOW (ref 4.22–5.81)
RDW: 17.2 % — ABNORMAL HIGH (ref 11.5–15.5)
WBC: 5.1 10*3/uL (ref 4.0–10.5)
nRBC: 0 % (ref 0.0–0.2)

## 2018-07-26 LAB — HEPARIN LEVEL (UNFRACTIONATED): Heparin Unfractionated: 1.32 IU/mL — ABNORMAL HIGH (ref 0.30–0.70)

## 2018-07-26 LAB — GLUCOSE, CAPILLARY: Glucose-Capillary: 127 mg/dL — ABNORMAL HIGH (ref 70–99)

## 2018-07-26 LAB — APTT: aPTT: 85 seconds — ABNORMAL HIGH (ref 24–36)

## 2018-07-26 LAB — MAGNESIUM: Magnesium: 1.9 mg/dL (ref 1.7–2.4)

## 2018-07-26 SURGERY — ESOPHAGOGASTRODUODENOSCOPY (EGD) WITH PROPOFOL
Anesthesia: Monitor Anesthesia Care

## 2018-07-26 MED ORDER — LIDOCAINE 2% (20 MG/ML) 5 ML SYRINGE
INTRAMUSCULAR | Status: DC | PRN
  Administered 2018-07-26: 40 mg via INTRAVENOUS

## 2018-07-26 MED ORDER — PROPOFOL 10 MG/ML IV BOLUS
INTRAVENOUS | Status: DC | PRN
  Administered 2018-07-26 (×2): 20 mg via INTRAVENOUS

## 2018-07-26 MED ORDER — TRAMADOL HCL 50 MG PO TABS
50.0000 mg | ORAL_TABLET | Freq: Once | ORAL | Status: AC
  Administered 2018-07-26: 50 mg via ORAL
  Filled 2018-07-26: qty 1

## 2018-07-26 MED ORDER — SUCRALFATE 1 GM/10ML PO SUSP
1.0000 g | Freq: Three times a day (TID) | ORAL | Status: DC
  Administered 2018-07-26 – 2018-07-29 (×10): 1 g via ORAL
  Filled 2018-07-26 (×13): qty 10

## 2018-07-26 MED ORDER — PHENYLEPHRINE 40 MCG/ML (10ML) SYRINGE FOR IV PUSH (FOR BLOOD PRESSURE SUPPORT)
PREFILLED_SYRINGE | INTRAVENOUS | Status: DC | PRN
  Administered 2018-07-26: 80 ug via INTRAVENOUS

## 2018-07-26 MED ORDER — PROPOFOL 500 MG/50ML IV EMUL
INTRAVENOUS | Status: DC | PRN
  Administered 2018-07-26: 100 ug/kg/min via INTRAVENOUS

## 2018-07-26 MED ORDER — SODIUM CHLORIDE 0.9 % IV SOLN
INTRAVENOUS | Status: DC
  Administered 2018-07-26: 10:00:00 via INTRAVENOUS

## 2018-07-26 SURGICAL SUPPLY — 15 items

## 2018-07-26 NOTE — Consult Note (Signed)
Hospital Consult    Reason for Consult: Left femoral pseudoaneurysm Referring Physician: Dr. Florene Glen MRN #:  [id number]  History of Present Illness: This is a 73 y.o. male with multiple medical problems including atrial fibrillation on Eliquis, CAD s/p CABG x2, chronic mesenteric ischemia s/p celiac and SMA stent, CKD, and diastolic heart failure that vascular surgery has been consulted for a left femoral pseudoaneurysm.  Patient was admitted for dysphasia and nausea with previous underlying issues of peptic and esophageal strictures requiring esophageal dilations.  Reportedly yesterday when walking with PT started having increasing left groin discomfort.  A duplex was ordered today that showed evidence of a left common femoral pseudoaneurysm.  Patient had a recent left common femoral artery access on 07/16/2018 by Dr. Oneida Alar for mesenteric intervention.  Ultimately underwent celiac artery and SMA stenting.  Patient states his pain is 7 out of 10 in the left groin.  He is hemodynamically stable.  His hemoglobin is 8.0 and was 7.5 prior to his procedure by Dr. Oneida Alar.  Patient did undergo EGD today.  Eliquis has been held this hospitalization.  Past Medical History:  Diagnosis Date  . Anemia   . Angina   . Atrial fibrillation ([place])   . Blood transfusion   . Bronchitis   . Bulging discs    "8 of them; thoracic, lumbar, sacral area"  . Chronic back pain   . Colon polyps   . Complication of anesthesia   . Coronary artery disease 01/24/11   Successful PCI long segmental stensois  vein graft to the CX marginal vessel-graft previously stented prox. 3.0x58mm TAXUS stent mid seg 3.0x52mm TAXUS stent now tandem stens of 3.0x42mm & 3.0x41mm placed with the seg. 50,80 & 99% stenosis reduced to 0%  . DDD (degenerative disc disease)   . Diastolic heart failure ([place])   . Dysrhythmia    "PAC's and PVC's"  . Esophageal dilatation    "have had it done 7 times; last time ~ 2001"  . Esophageal stricture   .  GERD (gastroesophageal reflux disease)   . Headache(784.0)   . Hiatal hernia   . Hyperlipidemia   . Myocardial infarction ([place]) 1991  . Myocardial infarction [facility]) 2005   "had 3 heart attacks this year"  . Paroxysmal atrial fibrillation (HCC)   . Pyloric stenosis   . RBBB   . Renal artery stenosis (H[facility])    hhistory of right renal artery stenting and re-intervention for "in-stent restenosis.  . S/P CABG (coronary artery bypass graft) [place]  . Shortness of breath    "when I had the heart attacks"  . Subclavian artery stenosis, left ([place])   . Ulcer    "small; Dr. Henrene Pastor found it 02/2010"    Past Surgical History:  Procedure Laterality Date  . ADENOIDECTOMY     "when I was an infant"  . APPENDECTOMY  1953  . CARDIOVERSION N/A 04/17/2017   Procedure: CARDIOVERSION;  Surgeon: Jerline Pain, MD;  Location: [facility] ENDOSCOPY;  Service: Cardiovascular;  Laterality: N/A;  . CAROTID ENDARTERECTOMY [place]  01/24/11   "today makes a total of 9 stents"  . CORONARY ARTERY BYPASS GRAFT  1984   CABG X 4  . CORONARY ARTERY BYPASS GRAFT  1996   CABG X 4  . LEFT HEART CATHETERIZATION WITH CORONARY/GRAFT ANGIOGRAM N/A 01/24/2011   Procedure: LEFT HEART CATHETERIZATION WITH CORONARY/GRAFT ANGIOGRAM;  Surgeon: Marcello Moores  Floyce Stakes, MD;  Location: [facility] CATH LAB;  Service: Cardiovascular;  Laterality: N/A;  . PERIPHERAL VASCULAR INTERVENTION  07/16/2018   Procedure: PERIPHERAL VASCULAR INTERVENTION;  Surgeon: Elam Dutch, MD;  Location: [place] CV LAB;  Service: Cardiovascular;;  SMA and Celiac  . pylondial cyst removal    . RETINAL DETACHMENT SURGERY  04/2007   right  . TONSILLECTOMY  1972  . VISCERAL ANGIOGRAPHY N/A 07/16/2018   Procedure: VISCERAL ANGIOGRAPHY;  Surgeon: Elam Dutch, MD;  Location: [place] CV LAB;  Service: Cardiovascular;  Laterality: N/A;    Allergies  Allergen Reactions  .  Codeine Nausea And Vomiting  . Sulfa Antibiotics Rash  . Sulfonamide Derivatives Rash    Prior to Admission medications   Medication Sig Start Date End Date Taking? Authorizing Provider  aspirin EC 81 MG EC tablet Take 1 tablet (81 mg total) by mouth daily. 07/20/18  Yes Kayleen Memos, DO  diltiazem (CARDIZEM) 30 MG tablet Take one tablet by mouth every 4 hours AS NEEDED for A-fib HR > 100 as long as BP > 100. 06/30/17  Yes Sherran Needs, NP  dutasteride (AVODART) 0.5 MG capsule Take 0.5 mg by mouth daily.  03/22/14  Yes [provider]  ELIQUIS 5 MG TABS tablet TAKE 1 TABLET BY MOUTH TWICE A DAY Patient taking differently: Take 5 mg by mouth 2 (two) times daily.  05/14/18  Yes Camnitz, Will Hassell Done, MD  ezetimibe (ZETIA) 10 MG tablet TAKE 1 TABLET (10 MG TOTAL) BY MOUTH DAILY. Patient taking differently: Take 10 mg by mouth daily.  03/05/18  Yes Troy Sine, MD  Ketotifen Fumarate (ALAWAY OP) Place 1 drop into the right eye daily.   Yes [provider]  loratadine (CLARITIN) 10 MG tablet Take 10 mg by mouth daily as needed for allergies, rhinitis or itching.  12/12/13  Yes [provider]  lovastatin (MEVACOR) 40 MG tablet Take 20 mg by mouth at bedtime.   Yes [provider]  metoprolol tartrate (LOPRESSOR) 100 MG tablet Take 1 tablet (100 mg total) by mouth 2 (two) times daily. 07/19/18  Yes Kayleen Memos, DO  Multiple Vitamins-Minerals (MULTIVITAMINS THER. W/MINERALS) TABS Take 1 tablet by mouth daily.     Yes [provider]  nitroGLYCERIN (NITROLINGUAL) 0.4 MG/SPRAY spray Place 1 spray under the tongue every 5 (five) minutes x 3 doses as needed for chest pain. 01/01/18  Yes [name], MD  pantoprazole (PROTONIX) 40 MG tablet Take 1 tablet (40 mg total) by mouth 2 (two) times daily. 07/21/18  Yes Kayleen Memos, DO  ranolazine (RANEXA) 1000 MG SR tablet Take 500 mg by mouth 2 (two) times daily.   Yes [provider]  sucralfate  (CARAFATE) 1 GM/10ML suspension Take 10 mLs (1 g total) by mouth daily as needed (esophageal burning). 07/21/18  Yes Kayleen Memos, DO  torsemide (DEMADEX) 20 MG tablet Take 20 mg by mouth as needed (for fluid). FOR SWELLING OR WEIGHT GAIN   Yes [provider]  vitamin E 400 UNIT capsule Take 400 Units by mouth daily.    Yes [provider]    Social History   Socioeconomic History  . Marital status: Married    Spouse name: Not on file  . Number of children: Not on file  . Years of education: Not on file  . Highest education level: Not on file  Occupational History  . Not on file  Social Needs  .  Financial resource strain: Not hard at all  . Food insecurity:    Worry: Never true    Inability: Never true  . Transportation needs:    Medical: No    Non-medical: No  Tobacco Use  . Smoking status: Former Smoker    Packs/day: 1.00    Years: 38.00    Pack years: 38.00    Types: Cigarettes    Last attempt to quit: 02/09/2005    Years since quitting: 13.4  . Smokeless tobacco: Never Used  . Tobacco comment: "quit smoking cigarrettes 2006"  Substance and Sexual Activity  . Alcohol use: No    Alcohol/week: 0.0 standard drinks  . Drug use: No  . Sexual activity: Not on file  Lifestyle  . Physical activity:    Days per week: 0 days    Minutes per session: 0 min  . Stress: Only a little  Relationships  . Social connections:    Talks on phone: Not on file    Gets together: Not on file    Attends religious service: Not on file    Active member of club or organization: No    Attends meetings of clubs or organizations: Never    Relationship status: Married  . Intimate partner violence:    Fear of current or ex partner: No    Emotionally abused: No    Physically abused: No    Forced sexual activity: No  Other Topics Concern  . Not on file  Social History Narrative  . Not on file     Family History  Problem Relation Age of Onset  . Breast cancer Paternal  Aunt   . Diabetes Sister   . Ulcers Father   . Heart disease Father   . Heart failure Father   . Heart attack Father   . AAA (abdominal aortic aneurysm) Father   . Stroke Mother   . Hypertension Mother   . Colon cancer Neg Hx     ROS: [x]  Positive   [ ]  Negative   [ ]  All sytems reviewed and are negative  Cardiovascular: []  chest pain/pressure []  palpitations []  SOB lying flat []  DOE []  pain in legs while walking []  pain in legs at rest []  pain in legs at night []  non-healing ulcers []  hx of DVT []  swelling in legs  Pulmonary: []  productive cough []  asthma/wheezing []  home O2  Neurologic: []  weakness in []  arms []  legs []  numbness in []  arms []  legs []  hx of CVA []  mini stroke [] difficulty speaking or slurred speech []  temporary loss of vision in one eye []  dizziness  Hematologic: []  hx of cancer []  bleeding problems []  problems with blood clotting easily  Endocrine:   []  diabetes []  thyroid disease  GI []  vomiting blood []  blood in stool  GU: []  CKD/renal failure []  HD--[]  M/W/F or []  T/T/S []  burning with urination []  blood in urine  Psychiatric: []  anxiety []  depression  Musculoskeletal: []  arthritis []  joint pain  Integumentary: []  rashes []  ulcers  Constitutional: []  fever []  chills   Physical Examination  Vitals:   07/26/18 1148 07/26/18 1714  BP: 129/78 126/64  Pulse: 83 97  Resp: 20   Temp: 98.4 F (36.9 C)   SpO2: 100% 100%   Body mass index is 21.64 kg/m.  General:  WDWN in NAD Gait: Not observed HENT: WNL, normocephalic Pulmonary: normal non-labored breathing, without Rales, rhonchi,  wheezing Cardiac: RRR Abdomen: soft, NT/ND, no masses Vascular Exam/Pulses:  Right Left  Radial 2+ (normal) 2+ (normal)  Ulnar    Femoral 2+ (normal) 2+ (normal)  Popliteal    DP 1+ (weak) Signal  PT  Signal   Extremities: without ischemic changes, without Gangrene , without cellulitis; without open wounds;  Musculoskeletal:  no muscle wasting or atrophy  Neurologic: A&O X 3; Appropriate Affect ; SENSATION: normal; MOTOR FUNCTION:  moving all extremities equally. Speech is fluent/normal   CBC    Component Value Date/Time   WBC 5.1 07/26/2018 0427   RBC 2.41 (L) 07/26/2018 0427   HGB 8.0 (L) 07/26/2018 0427   HGB 11.4 (L) 05/30/2017 1411   HCT 23.6 (L) 07/26/2018 0427   HCT 33.5 (L) 05/30/2017 1411   PLT 191 07/26/2018 0427   PLT 163 05/30/2017 1411   MCV 97.9 07/26/2018 0427   MCV 91 05/30/2017 1411   MCH 33.2 07/26/2018 0427   MCHC 33.9 07/26/2018 0427   RDW 17.2 (H) 07/26/2018 0427   RDW 15.1 05/30/2017 1411   LYMPHSABS 0.4 (L) 07/23/2018 2045   MONOABS 0.7 07/23/2018 2045   EOSABS 0.0 07/23/2018 2045   BASOSABS 0.0 07/23/2018 2045    BMET    Component Value Date/Time   NA 134 (L) 07/26/2018 0427   NA 143 05/30/2017 1411   K 3.8 07/26/2018 0427   CL 102 07/26/2018 0427   CO2 24 07/26/2018 0427   GLUCOSE 100 (H) 07/26/2018 0427   BUN 11 07/26/2018 0427   BUN 22 05/30/2017 1411   CREATININE 1.06 07/26/2018 0427   CREATININE 1.12 10/14/2015 1117   CALCIUM 8.4 (L) 07/26/2018 0427   GFRNONAA >60 07/26/2018 0427   GFRAA >60 07/26/2018 0427    COAGS: Lab Results  Component Value Date   INR 1.08 01/24/2011     Non-Invasive Vascular Imaging:    Left groin pseudoaneurysm duplex: On my review there does appear to be pseudoaneurysm off the left common femoral artery with a to and fro signal.  One image suggest the neck is about 4 mm in length whereas another image suggest the neck is closer to 8 mm in length.  Neck does appear very narrow.   ASSESSMENT/PLAN: This is a 73 y.o. male that vascular surgery has been consulted for left femoral pseudoaneurysm approximately 2.6 x 4.8 cm after left common femoral artery access on 07/16/18 for mesenteric intervention by Dr. Oneida Alar.  Discussed with patient that options would likely be attempted ultrasound compression (which I think her would tolerate)  and if this fails attempt at thrombin injection.  I discussed with the vascular lab this evening but unfortunately there is no staffing available at this time.  Plan for attempted ultrasound compression first thing in the morning and if that fails will require attempted thrombin injection.  Risk and benefits discussed with patient including risk of thromboembolism if he requires injection.  He wished to proceed and states that his groin is fairly painful and limiting his ability to walk.  Marty Heck, MD [facility] Office: [phone] Pager: [place]

## 2018-07-26 NOTE — Anesthesia Postprocedure Evaluation (Signed)
Anesthesia Post Note  Patient: [name]  Procedure(s) Performed: ESOPHAGOGASTRODUODENOSCOPY (EGD) WITH PROPOFOL (N/A ) BIOPSY     Patient location during evaluation: Endoscopy Anesthesia Type: MAC Level of consciousness: awake and alert Pain management: pain level controlled Vital Signs Assessment: post-procedure vital signs reviewed and stable Respiratory status: spontaneous breathing, nonlabored ventilation, respiratory function stable and patient connected to nasal cannula oxygen Cardiovascular status: stable and blood pressure returned to baseline Postop Assessment: no apparent nausea or vomiting Anesthetic complications: no    Last Vitals:  Vitals:   07/26/18 1043 07/26/18 1148  BP: (!) 112/45 129/78  Pulse: 91 83  Resp: 16 20  Temp:  36.9 C  SpO2: 99% 100%    Last Pain:  Vitals:   07/26/18 1148  TempSrc: Oral  PainSc:                  Shewanda Sharpe COKER

## 2018-07-26 NOTE — Progress Notes (Signed)
Pseudoaneurysm check has been completed.  Results given to Dr. Florene Glen.   Preliminary results in CV Proc.   Abram Sander 07/26/2018 2:24 PM

## 2018-07-26 NOTE — Op Note (Signed)
[facility] Patient Name: [name] Procedure Date : 07/26/2018 MRN: [id number] Attending MD: Thornton Park MD, MD Date of Birth: [birth year] CSN: [id number] Age: 73 Admit Type: Inpatient Procedure:                Upper GI endoscopy Indications:              Dysphagia, Heartburn Providers:                Thornton Park MD, MD, Raynelle Bring, RN, Elspeth Cho Tech., Technician, Ladona Ridgel,                            Technician Referring MD:              Medicines:                See the Anesthesia note for documentation of the                            administered medications Complications:            No immediate complications. Estimated blood loss:                            Minimal. Estimated Blood Loss:     Estimated blood loss was minimal. Procedure:                Pre-Anesthesia Assessment:                           - Prior to the procedure, a History and Physical                            was performed, and patient medications and                            allergies were reviewed. The patient's tolerance of                            previous anesthesia was also reviewed. The risks                            and benefits of the procedure and the sedation                            options and risks were discussed with the patient.                            All questions were answered, and informed consent                            was obtained. Prior Anticoagulants: The patient has                            taken Eliquis (apixaban), last dose  was 4 days                            prior to procedure. ASA Grade Assessment: III - A                            patient with severe systemic disease. After                            reviewing the risks and benefits, the patient was                            deemed in satisfactory condition to undergo the                            procedure.                           After  obtaining informed consent, the endoscope was                            passed under direct vision. Throughout the                            procedure, the patient's blood pressure, pulse, and                            oxygen saturations were monitored continuously. The                            GIF-H190 ([id number]) Olympus gastroscope was                            introduced through the mouth, and advanced to the                            third part of duodenum. The upper GI endoscopy was                            accomplished without difficulty. The patient                            tolerated the procedure well. Scope In: Scope Out: Findings:      One linear esophageal ulcer with no bleeding and no stigmata of recent       bleeding was found 32 cm to 36 cm from the incisors. Biopsies were taken       with a cold forceps for histology. There is evidence for associated       esophagitis in the distal esophagus.      One benign-appearing, intrinsic mild stenosis was found in the distal       esophagus. The stenosis was easily traversed. A small hiatal hernia is       present.      A benign-appearing, intrinsic, widely patent stenosis was found at the       pylorus.  This was traversed without difficulty.      The examined duodenum was normal.      The exam was otherwise without abnormality. Impression:               - Non-bleeding esophageal ulcer. Biopsied.                           - Small hiatal hernia.                           - Benign-appearing esophageal stenosis.                           - Gastric stenosis was found at the pylorus.                           - Normal examined duodenum.                           - The examination was otherwise normal.                           - Esophageal dilation was not performed given the                            active ulceration. Recommendation:           - Advance diet as tolerated today.                           - Continue  present medications. Pantoprazole 40 mg                            BID x 8 weeks. Please convert to Nexium 40 mg BID x                            8 weeks on discharge, as [name] feels the Nexium                            works better. Resume Carafate 1 gram slurry QID x 2                            weeks. [name] took one dose of Carafate                            previously that did not provide relief and he did                            not continue with treatment at that time.                           - Reviewed lifestyle modifications for reflux                            including remaining upright as much as possible  and                            not eating within 3 hours prior to bedtime.                           - Await biopsy results.                           - No additional inpatient GI evaluation planned.                            [name] may follow-up with Dr. Henrene Pastor as needed on                            discharge. Procedure Code(s):        --- Professional ---                           [phone], Esophagogastroduodenoscopy, flexible,                            transoral; with biopsy, single or multiple Diagnosis Code(s):        --- Professional ---                           K22.10, Ulcer of esophagus without bleeding                           K22.2, Esophageal obstruction                           K31.1, Adult hypertrophic pyloric stenosis                           R13.10, Dysphagia, unspecified                           R12, Heartburn CPT copyright 2019 [employer] Medical Association. All rights reserved. The codes documented in this report are preliminary and upon coder review may  be revised to meet current compliance requirements. Thornton Park MD, MD 07/26/2018 10:59:27 AM This report has been signed electronically. Number of Addenda: 0

## 2018-07-26 NOTE — Progress Notes (Signed)
Patient has been complaining of increasing pain at the L femoral site.  Site seems more slight more swollen and bruised.  Page MD

## 2018-07-26 NOTE — Plan of Care (Signed)

## 2018-07-26 NOTE — Progress Notes (Signed)
Physical Therapy Treatment Patient Details Name: [name] MRN: [id number] DOB: 07-31-1945 Today's Date: 07/26/2018    History of Present Illness 73 yo male admitted with N/V edema pt with CHf and possible esophageal stenosis. PMhx: Rt hip fx with ORIF april 2020, CAD, CABG, AFib, mesenteric ischemia, PVD, Rt CEA, CKD, NSTEMI May 2020    PT Comments    Pt pleasant and awaiting esophageal dilation but very willing to mobilize. Pt reports increased pain in left thigh since stenting with stretching pulling pain throughout the thigh. Pt with continued weakness of right hip and left thigh pain impacting his posture and activity tolerance. Encouraged HEp and mobility with nursing daily.    Follow Up Recommendations  Home health PT;Supervision for mobility/OOB     Equipment Recommendations  None recommended by PT    Recommendations for Other Services       Precautions / Restrictions Precautions Precautions: Fall Restrictions Weight Bearing Restrictions: No RLE Weight Bearing: Weight bearing as tolerated    Mobility  Bed Mobility Overal bed mobility: Modified Independent Bed Mobility: Supine to Sit           General bed mobility comments: increased time and struggle to move legs to EOB, normally sleeps in recliner due to reflux  Transfers Overall transfer level: Needs assistance   Transfers: Sit to/from Stand Sit to Stand: Supervision         General transfer comment: cues for hand placement, increased time, required 2 trials prior to being able to maintain standing due to left thigh tightness and pain  Ambulation/Gait Ambulation/Gait assistance: Supervision Gait Distance (Feet): 140 Feet Assistive device: Rolling walker (2 wheeled) Gait Pattern/deviations: Step-through pattern;Decreased stride length;Trunk flexed   Gait velocity interpretation: >2.62 ft/sec, indicative of community ambulatory General Gait Details: slow steady gait, limited by  fatigue   Stairs             Wheelchair Mobility    Modified Rankin (Stroke Patients Only)       Balance Overall balance assessment: Mild deficits observed, not formally tested                                          Cognition Arousal/Alertness: Awake/alert Behavior During Therapy: WFL for tasks assessed/performed Overall Cognitive Status: Within Functional Limits for tasks assessed                                        Exercises General Exercises - Lower Extremity Long Arc Quad: Seated;AROM;Both;15 reps Hip ABduction/ADduction: AAROM;Seated;Both;15 reps Hip Flexion/Marching: Seated;AROM;Both;15 reps Toe Raises: AROM;15 reps;Seated;Both    General Comments        Pertinent Vitals/Pain Faces Pain Scale: Hurts even more Pain Location: left groin/thigh  Pain Descriptors / Indicators: Tightness;Aching Pain Intervention(s): Limited activity within patient's tolerance;Monitored during session;Repositioned    Home Living                      Prior Function            PT Goals (current goals can now be found in the care plan section) Progress towards PT goals: Progressing toward goals    Frequency           PT Plan Current plan remains appropriate    Co-evaluation  AM-PAC PT "6 Clicks" Mobility   Outcome Measure  Help needed turning from your back to your side while in a flat bed without using bedrails?: A Little Help needed moving from lying on your back to sitting on the side of a flat bed without using bedrails?: A Little Help needed moving to and from a bed to a chair (including a wheelchair)?: A Little Help needed standing up from a chair using your arms (e.g., wheelchair or bedside chair)?: A Little Help needed to walk in hospital room?: A Little Help needed climbing 3-5 steps with a railing? : A Little 6 Click Score: 18    End of Session Equipment Utilized During Treatment:  Gait belt Activity Tolerance: Patient tolerated treatment well Patient left: in chair;with call bell/phone within reach;with chair alarm set Nurse Communication: Mobility status PT Visit Diagnosis: Other abnormalities of gait and mobility (R26.89);Difficulty in walking, not elsewhere classified (R26.2);Muscle weakness (generalized) (M62.81)     Time: 6728-9791 PT Time Calculation (min) (ACUTE ONLY): 17 min  Charges:  $Gait Training: 8-22 mins                     Jaloni Davoli Pam Drown, PT Acute Rehabilitation Services Pager: [phone] Office: [place] 07/26/2018, 12:31 PM

## 2018-07-26 NOTE — Progress Notes (Signed)
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  In ED, hemodynamically stable.  CBC remarkable for hemoglobin to 8.3 (baseline).  CMP with mild hyponatremia to 131 (about baseline).  Troponin 0 0.042.  BNP 1122 (no prior to compare to).  COVID-19 test negative.  CXR without acute finding.  Admitted for N/V/D/dysphagia.  Now s/p evaluation by GI with endoscopy as noted below. Worsening L groin pain this AM, had [place] concerning for pseudoaneurysm.  Assessment & Plan:   Active Problems:   Essential hypertension   Hx of CABG '84, '96, last PCI 2012   Ulcer of esophagus without bleeding   Anemia   PVD (peripheral vascular disease) (HCC)   Hyperlipidemia LDL goal <70   PAF (paroxysmal atrial fibrillation) (HCC)   Coronary artery disease   Diastolic CHF (HCC)   Chronic mesenteric ischemia (HCC)   Nausea and vomiting   Dysphagia   Malnutrition of moderate degree  Left Groin Pain  Pseudoaneurysm:  S/p SMA and celiac stenting 07/16/18, per nurse, groin more tender this morning with worsened bruising Prelim [place] with pseudoaneurysm 4.8 x 2.6 cm Discussed with vascular surgery who will see him today Will hold on resuming eliquis given need for procedure for above  Dysphagia/odynophagia/GERD/nausea/vomiting in patient with known history of esophageal stricture status post dilation x7 -Evaluated by SLP who  recommended GI evaluation. -[facility] consulted, appreciate recommendations - S/p endoscopy on 5/21, notable for nonbleeding esophageal ulcer (biopsied), benign appearing esophageal stenosis (dilatation not done due to active ulceration), gastric stenosis at the pylorus.  Recommending PPI x 8 weeks (nexium 40 mg BID at discharge per pt request).  Carafate slurry x 2 weeks.  Follow biopsy results.  Remain upright as much as possible, avoid eating within 3 hrs bedtime. -Continue IV Protonix twice daily -Continue Carafate -Initiate clear liquid diet (per SLP, recommending thin liquids and GI c/s)  Chronic diastolic CHF: AS50-53%, mild MR, mild TR, and mild pulmonary hypertension by TTE6/20/2018.  Has 2+ pitting edema but no cardiopulmonary symptoms.  His edema could be due to malnutrition.  BNP elevated to 1120 but no baseline to compare to. -Resume home p.o. meds. -Hold home diuretics for now, takes prn at home. -Daily weight, intake output and renal function. -We will obtain TSH (wnl), prealbumin (19.2).   -Urinalysis without significant proteinuria (30 mg/dl).  Peripheral vascular disease/chronic mesenteric ischemia status post SMA and celiac stenting on 07/16/2018. -Continue home medications.  CAD status post CABG: Stable.  No anginal symptoms.  Troponin flat at 0.04.  EKG with Shloime Keilman. fib, rate controlled, RBBB, LPFB and occasional PVCs but no acute ischemic finding. -Continue home medications.  Permanent Colson Barco. fib without RVR. -Continue home meds. -Hold anticoagulation right now due to pseudoaneurysm  Anemia of chronic disease: Hgb at baseline. -Continue monitoring  Hypertension: Normotensive. -Continue home meds  Right hip fracture status post ORIF: Reports progressing well and ambulating with use of Sylvi Rybolt walker at home now. -PT eval  DVT prophylaxis: SCD Code Status: full  Family Communication: none at  bedside Disposition Plan: pending GI evaluation   Consultants:   GI   Procedures:   - Non-bleeding esophageal ulcer. Biopsied. - Small hiatal hernia. - Benign-appearing esophageal stenosis. - Gastric stenosis was found at the pylorus. - Normal examined duodenum. - The examination was otherwise normal. - Esophageal dilation was not performed given the active ulceration. - Advance diet as tolerated today. - Continue present medications. Pantoprazole 40 mg BID x 8 weeks. Please convert to Nexium 40 mg BID x 8 weeks on discharge, as [name] feels the Nexium works better. Resume Carafate 1 gram slurry QID x 2 weeks. [name] took one dose of Carafate previously that did not provide relief and he did not continue with treatment at that time. - Reviewed lifestyle modifications for reflux including remaining upright as much as possible and not eating within 3 hours prior to bedtime. - Await biopsy results. - No additional inpatient GI evaluation planned. [name] may follow-up with Dr. Henrene Pastor as needed on discharge.  Antimicrobials:  Anti-infectives (From admission, onward)   None     Subjective: C/o difficulty swallowing Liquids and solids Hx of multiple dilatations in past.  Objective: Vitals:   07/26/18 0943 07/26/18 1033 07/26/18 1043 07/26/18 1148  BP: (!) 131/59 121/60 (!) 112/45 129/78  Pulse: 88 88 91 83  Resp: 17 11 16 20   Temp: 97.9 F (36.6 C) 97.9 F (36.6 C)  98.4 F (36.9 C)  TempSrc: Oral Axillary  Oral  SpO2: 100% 99% 99% 100%  Weight: 59 kg     Height: 5\' 5"  (1.651 m)       Intake/Output Summary (Last 24 hours) at 07/26/2018 1544 Last data filed at 07/26/2018 1500 Gross per 24 hour  Intake 1169.32 ml  Output 2675 ml  Net -1505.68 ml   Filed Weights   07/25/18 0550 07/26/18 0421 07/26/18 0943  Weight: 60.3 kg 59.1 kg 59 kg    Examination:  General exam: Appears calm and comfortable  Respiratory system: Clear to auscultation. Respiratory effort normal. Cardiovascular system: S1 & S2 heard, RRR. Gastrointestinal  system: Abdomen is nondistended, soft and nontender.  Central nervous system: Alert and oriented. No focal neurological deficits. Extremities: moving all extremities Skin: No rashes, lesions or ulcers Psychiatry: Judgement and insight appear normal. Mood & affect appropriate.     Data Reviewed: I have personally reviewed following labs and imaging studies  CBC: Recent Labs  Lab 07/20/18 0758 07/23/18 2045 07/24/18 0723 07/25/18 0505 07/26/18 0427  WBC 7.9 9.5 8.4 5.5 5.1  NEUTROABS 5.9 8.3*  --   --   --   HGB 8.4* 8.3* 8.3* 7.5* 8.0*  HCT 25.3* 25.5* 24.9* 22.5* 23.6*  MCV 95.1 99.2 96.9 97.8 97.9  PLT 202 223 216 190 295   Basic Metabolic Panel: Recent Labs  Lab 07/20/18 0758 07/23/18 2045 07/24/18 0723 07/25/18 0505 07/26/18 0427  NA 130* 131* 129* 132* 134*  K 4.3 3.7 4.3 3.9 3.8  CL 100 98 97* 101 102  CO2 24 23 23 25 24   GLUCOSE 131* 116* 97 97 100*  BUN 24* 20 20 14 11   CREATININE 1.27* 1.05 1.15 1.11 1.06  CALCIUM 8.4* 8.4* 8.4* 8.2* 8.4*  MG  --  1.8  --  1.9 1.9   GFR: Estimated Creatinine Clearance: 51 mL/min (by C-G formula based on SCr of 1.06 mg/dL). Liver Function Tests: Recent Labs  Lab 07/20/18 0758 07/23/18 2045 07/26/18 0427  AST 15 15 16   ALT 14 12 10  ALKPHOS 83 82 84  BILITOT 0.9 1.2 1.0  PROT 5.5* 5.3* 5.3*  ALBUMIN 2.8* 2.7* 2.8*   No results for input(s): LIPASE, AMYLASE in the last 168 hours. No results for input(s): AMMONIA in the last 168 hours. Coagulation Profile: No results for input(s): INR, PROTIME in the last 168 hours. Cardiac Enzymes: Recent Labs  Lab 07/23/18 2045 07/24/18 0219 07/24/18 0723  TROPONINI 0.04* 0.04* 0.04*   BNP (last 3 results) No results for input(s): PROBNP in the last 8760 hours. HbA1C: No results for input(s): HGBA1C in the last 72 hours. CBG: No results for input(s): GLUCAP in the last 168 hours. Lipid Profile: No results for input(s): CHOL, HDL, LDLCALC, TRIG, CHOLHDL, LDLDIRECT in  the last 72 hours. Thyroid Function Tests: Recent Labs    07/24/18 1543  TSH 0.857   Anemia Panel: No results for input(s): VITAMINB12, FOLATE, FERRITIN, TIBC, IRON, RETICCTPCT in the last 72 hours. Sepsis Labs: No results for input(s): PROCALCITON, LATICACIDVEN in the last 168 hours.  Recent Results (from the past 240 hour(s))  SARS Coronavirus 2 (CEPHEID - Performed in [facility] hospital lab), Hosp Order     Status: None   Collection Time: 07/23/18  8:45 PM  Result Value Ref Range Status   SARS Coronavirus 2 NEGATIVE NEGATIVE Final    Comment: (NOTE) If result is NEGATIVE SARS-CoV-2 target nucleic acids are NOT DETECTED. The SARS-CoV-2 RNA is generally detectable in upper and lower  respiratory specimens during the acute phase of infection. The lowest  concentration of SARS-CoV-2 viral copies this assay can detect is 250  copies / mL. Michial Disney negative result does not preclude SARS-CoV-2 infection  and should not be used as the sole basis for treatment or other  patient management decisions.  Carolynn Tuley negative result may occur with  improper specimen collection / handling, submission of specimen other  than nasopharyngeal swab, presence of viral mutation(s) within the  areas targeted by this assay, and inadequate number of viral copies  (<250 copies / mL). Racquelle Hyser negative result must be combined with clinical  observations, patient history, and epidemiological information. If result is POSITIVE SARS-CoV-2 target nucleic acids are DETECTED. The SARS-CoV-2 RNA is generally detectable in upper and lower  respiratory specimens dur ing the acute phase of infection.  Positive  results are indicative of active infection with SARS-CoV-2.  Clinical  correlation with patient history and other diagnostic information is  necessary to determine patient infection status.  Positive results do  not rule out bacterial infection or co-infection with other viruses. If result is PRESUMPTIVE POSTIVE SARS-CoV-2  nucleic acids MAY BE PRESENT.   Shannara Winbush presumptive positive result was obtained on the submitted specimen  and confirmed on repeat testing.  While 2019 novel coronavirus  (SARS-CoV-2) nucleic acids may be present in the submitted sample  additional confirmatory testing may be necessary for epidemiological  and / or clinical management purposes  to differentiate between  SARS-CoV-2 and other Sarbecovirus currently known to infect humans.  If clinically indicated additional testing with an alternate test  methodology [phone]) is advised. The SARS-CoV-2 RNA is generally  detectable in upper and lower respiratory sp ecimens during the acute  phase of infection. The expected result is Negative. Fact Sheet for Patients:  StrictlyIdeas.no Fact Sheet for Healthcare Providers: BankingDealers.co.za This test is not yet approved or cleared by the [place] FDA and has been authorized for detection and/or diagnosis of SARS-CoV-2 by FDA under an Emergency Use Authorization (EUA).  This EUA will  remain in effect (meaning this test can be used) for the duration of the COVID-19 declaration under Section 564(b)(1) of the Act, 21 U.S.C. section 360bbb-3(b)(1), unless the authorization is terminated or revoked sooner. Performed at [facility], [place] [address] [place], [place] [address]   Urine culture     Status: None   Collection Time: 07/24/18  1:08 AM  Result Value Ref Range Status   Specimen Description URINE, CATHETERIZED  Final   Special Requests STERILE CONTAINER  Final   Culture   Final    NO GROWTH Performed at [facility], [address] [place], [place] [address]    Report Status 07/25/2018 FINAL  Final         Radiology Studies: Vas [place] Groin Pseudoaneurysm  Result Date: 07/26/2018  ARTERIAL PSEUDOANEURYSM  Exam: Left groin Indications: Patient complains of groin pain. History: S/p catheterization by Dr. Oneida Alar 07/16/18.  Performing Technologist: Abram Sander RVS  Examination Guidelines: Birdia Jaycox complete evaluation includes B-mode imaging, spectral Doppler, color Doppler, and power Doppler as needed of all accessible portions of each vessel. Bilateral testing is considered an integral part of Avangeline Stockburger complete examination. Limited examinations for reoccurring indications may be performed as noted. +-----------+----------+--------+------+----------+ Left DuplexPSV (cm/s)WaveformPlaqueComment(s) +-----------+----------+--------+------+----------+ CFA           207    biphasic                 +-----------+----------+--------+------+----------+ Prox SFA       89    biphasic                 +-----------+----------+--------+------+----------+ Left Vein comments:  Findings: An area with well defined borders measuring 4.8 cm x 2.6 cm was visualized with ultrasound characteristics of Roshad Hack pseudoaneurysm. The neck measures approximately 0.4 cm wide and 0.8 cm long.    --------------------------------------------------------------------------------    Preliminary         Scheduled Meds: . aspirin EC  81 mg Oral Daily  . diltiazem  30 mg Oral Q6H  . feeding supplement  1 Container Oral TID BM  . ketotifen  1 drop Right Eye Daily  . multivitamin with minerals  1 tablet Oral Daily  . pantoprazole (PROTONIX) IV  40 mg Intravenous Q12H  . sodium chloride flush  3 mL Intravenous Q12H   Continuous Infusions:    LOS: 3 days    Time spent: over 30 min    Fayrene Helper, MD [facility] Pager AMION  If 7PM-7AM, please contact night-coverage [email or web address] Password [facility] 07/26/2018, 3:44 PM

## 2018-07-26 NOTE — Anesthesia Preprocedure Evaluation (Signed)
Anesthesia Evaluation  Patient identified by MRN, date of birth, ID band Patient awake    Reviewed: Allergy & Precautions, NPO status , Patient's Chart, lab work & pertinent test results  Airway Mallampati: II  TM Distance: >3 FB Neck ROM: Full    Dental no notable dental hx.    Pulmonary neg pulmonary ROS, former smoker,    Pulmonary exam normal breath sounds clear to auscultation       Cardiovascular hypertension, Pt. on home beta blockers and Pt. on medications + CAD, + Past MI, + Cardiac Stents, + CABG and + Peripheral Vascular Disease  Normal cardiovascular exam+ dysrhythmias Atrial Fibrillation  Rhythm:Irregular Rate:Normal     Neuro/Psych negative neurological ROS  negative psych ROS   GI/Hepatic Neg liver ROS, GERD  ,  Endo/Other  negative endocrine ROS  Renal/GU negative Renal ROS  negative genitourinary   Musculoskeletal negative musculoskeletal ROS (+)   Abdominal   Peds negative pediatric ROS (+)  Hematology  (+) anemia ,   Anesthesia Other Findings   Reproductive/Obstetrics negative OB ROS                             Anesthesia Physical Anesthesia Plan  ASA: III  Anesthesia Plan: MAC   Post-op Pain Management:    Induction: Intravenous  PONV Risk Score and Plan: 0  Airway Management Planned: Nasal Cannula  Additional Equipment:   Intra-op Plan:   Post-operative Plan:   Informed Consent: I have reviewed the patients History and Physical, chart, labs and discussed the procedure including the risks, benefits and alternatives for the proposed anesthesia with the patient or authorized representative who has indicated his/her understanding and acceptance.     Dental advisory given  Plan Discussed with: CRNA and Surgeon  Anesthesia Plan Comments:         Anesthesia Quick Evaluation

## 2018-07-26 NOTE — Transfer of Care (Signed)
Immediate Anesthesia Transfer of Care Note  Patient: [name]  Procedure(s) Performed: ESOPHAGOGASTRODUODENOSCOPY (EGD) WITH PROPOFOL (N/A ) BIOPSY  Patient Location: Endoscopy Unit  Anesthesia Type:MAC  Level of Consciousness: drowsy and patient cooperative  Airway & Oxygen Therapy: Patient Spontanous Breathing and Patient connected to nasal cannula oxygen  Post-op Assessment: Report given to RN and Post -op Vital signs reviewed and stable  Post vital signs: Reviewed and stable  Last Vitals:  Vitals Value Taken Time  BP 121/60 07/26/2018 10:33 AM  Temp    Pulse 91 07/26/2018 10:34 AM  Resp 25 07/26/2018 10:34 AM  SpO2 100 % 07/26/2018 10:34 AM  Vitals shown include unvalidated device data.  Last Pain:  Vitals:   07/26/18 0943  TempSrc: Oral  PainSc: 6       Patients Stated Pain Goal: 0 (45/62/56 3893)  Complications: No apparent anesthesia complications

## 2018-07-27 ENCOUNTER — Encounter (HOSPITAL_COMMUNITY): Payer: Self-pay | Admitting: Gastroenterology

## 2018-07-27 ENCOUNTER — Inpatient Hospital Stay (HOSPITAL_COMMUNITY): Payer: Medicare Other

## 2018-07-27 DIAGNOSIS — I724 Aneurysm of artery of lower extremity: Secondary | ICD-10-CM

## 2018-07-27 LAB — COMPREHENSIVE METABOLIC PANEL
ALT: 11 U/L (ref 0–44)
AST: 17 U/L (ref 15–41)
Albumin: 2.8 g/dL — ABNORMAL LOW (ref 3.5–5.0)
Alkaline Phosphatase: 93 U/L (ref 38–126)
Anion gap: 7 (ref 5–15)
BUN: 13 mg/dL (ref 8–23)
CO2: 22 mmol/L (ref 22–32)
Calcium: 8.3 mg/dL — ABNORMAL LOW (ref 8.9–10.3)
Chloride: 105 mmol/L (ref 98–111)
Creatinine, Ser: 0.96 mg/dL (ref 0.61–1.24)
GFR calc Af Amer: >60 mL/min (ref >60)
GFR calc non Af Amer: >60 mL/min (ref >60)
Glucose, Bld: 114 mg/dL — ABNORMAL HIGH (ref 70–99)
Potassium: 3.9 mmol/L (ref 3.5–5.1)
Sodium: 134 mmol/L — ABNORMAL LOW (ref 135–145)
Total Bilirubin: 1.1 mg/dL (ref 0.3–1.2)
Total Protein: 5.1 g/dL — ABNORMAL LOW (ref 6.5–8.1)

## 2018-07-27 LAB — CBC
HCT: 22.3 % — ABNORMAL LOW (ref 39.0–52.0)
Hemoglobin: 7.4 g/dL — ABNORMAL LOW (ref 13.0–17.0)
MCH: 32.2 pg (ref 26.0–34.0)
MCHC: 33.2 g/dL (ref 30.0–36.0)
MCV: 97 fL (ref 80.0–100.0)
Platelets: 153 10*3/uL (ref 150–400)
RBC: 2.3 MIL/uL — ABNORMAL LOW (ref 4.22–5.81)
RDW: 17.4 % — ABNORMAL HIGH (ref 11.5–15.5)
WBC: 4.8 10*3/uL (ref 4.0–10.5)
nRBC: 0 % (ref 0.0–0.2)

## 2018-07-27 LAB — MAGNESIUM: Magnesium: 1.8 mg/dL (ref 1.7–2.4)

## 2018-07-27 LAB — GLUCOSE, CAPILLARY: Glucose-Capillary: 106 mg/dL — ABNORMAL HIGH (ref 70–99)

## 2018-07-27 MED ORDER — GABAPENTIN 300 MG PO CAPS
300.0000 mg | ORAL_CAPSULE | Freq: Once | ORAL | Status: AC
  Administered 2018-07-27: 300 mg via ORAL
  Filled 2018-07-27: qty 1

## 2018-07-27 MED ORDER — ENSURE ENLIVE PO LIQD
237.0000 mL | Freq: Three times a day (TID) | ORAL | Status: DC
  Administered 2018-07-27 – 2018-07-28 (×3): 237 mL via ORAL

## 2018-07-27 MED ORDER — HYDROMORPHONE HCL 1 MG/ML IJ SOLN
0.5000 mg | INTRAMUSCULAR | Status: DC | PRN
  Administered 2018-07-27: 0.5 mg via INTRAVENOUS
  Filled 2018-07-27: qty 0.5

## 2018-07-27 MED ORDER — PANTOPRAZOLE SODIUM 40 MG PO TBEC
40.0000 mg | DELAYED_RELEASE_TABLET | Freq: Two times a day (BID) | ORAL | Status: DC
  Administered 2018-07-27 – 2018-07-29 (×4): 40 mg via ORAL
  Filled 2018-07-27 (×4): qty 1

## 2018-07-27 NOTE — Progress Notes (Signed)
Nutrition Follow-up  RD working remotely.  DOCUMENTATION CODES:   Non-severe (moderate) malnutrition in context of chronic illness  INTERVENTION:   -D/c Boost Breeze po TID, each supplement provides 250 kcal and 9 grams of protein -Continue MVI with minerals daily -Ensure Enlive po TID, each supplement provides 350 kcal and 20 grams of protein -Downgrade diet to dysphagia 3 (advanced mechanical soft), for ease of intake -Provided "National Dysphagia Diet Advanced Nutrition Therapy" handout from [facility] Nutrition Care Manual; text attached to AVS/discharge instructions  NUTRITION DIAGNOSIS:   Moderate Malnutrition related to chronic illness(dypshagia related to possible esophageal stricture) as evidenced by energy intake < or equal to 75% for > or equal to 1 month, mild fat depletion, moderate fat depletion, mild muscle depletion, moderate muscle depletion.  Ongoing  GOAL:   Patient will meet greater than or equal to 90% of their needs  Progressing   MONITOR:   PO intake, Supplement acceptance, Diet advancement, Labs, Weight trends, Skin, I & O's  REASON FOR ASSESSMENT:   Malnutrition Screening Tool    ASSESSMENT:   [name] is a 73 y.o. male with medical history significant for CAD s/p CABG x2 in 1984 and 1996, atrial fibrillation on Eliquis, chronic mesenteric ischemia s/p SMA and celiac stenting 07/16/18, PVD s/p renal artery stenting 2005 and Rt CEA 2005, CKD stage 2-3, GERD, PUD, peptic and esophageal strictures with previous esophageal dilatations, recent right hip fracture status post ORIF who presents to the hospital with severe heartburn, dysphagia, nausea, and occasional emesis.  5/21- s/p EGD with dilation, advanced to soft diet  Reviewed I/O's: +53 ml x 24 hours and -3.1 L since admission  UOP: 1.5 L x 24 hours  Pt remains with poor appetite; noted meal completion 25-50%. He is compliant with Boost Breeze supplements. Now that diet has been advanced, will  order Ensure Enlive for increased nutrient density.   Pt is currently on a soft diet, which is a low fiber/low residue diet designed for pt with chronic GI illnesses or recovering from GI surgery. Will downgrade diet to dysphagia 3 (advanced mechanical soft), which provides foods that are easier to chew and swallow due to altered texture. This would be a more appropriate diet for pt and hopefully assist with increased PO intake.   Labs reviewed: Na: 134, CBGS: 106-127.  Diet Order:   Diet Order            DIET SOFT Room service appropriate? Yes; Fluid consistency: Thin  Diet effective now              EDUCATION NEEDS:   Education needs have been addressed  Skin:  Skin Assessment: Skin Integrity Issues: Skin Integrity Issues:: Incisions Incisions: closed rt hip and lt groin  Last BM:  07/24/18  Height:   Ht Readings from Last 1 Encounters:  07/26/18 5\' 5"  (1.651 m)    Weight:   Wt Readings from Last 1 Encounters:  07/27/18 59.4 kg    Ideal Body Weight:  59.1 kg  BMI:  Body mass index is 21.79 kg/m.  Estimated Nutritional Needs:   Kcal:  1850-2050  Protein:  90-105 grams  Fluid:  > 1.8 L    Simisola Sandles A. Jimmye Norman, RD, LDN, [place] Registered Dietitian II Certified Diabetes Care and Education Specialist Pager: [phone] After hours Pager: [phone]

## 2018-07-27 NOTE — Discharge Instructions (Addendum)
Weakness Weakness is a lack of strength. You may feel weak all over your body (generalized), or you may feel weak in one part of your body (focal). There are many potential causes of weakness. Sometimes, the cause of your weakness may not be known. Some causes of weakness can be serious, so it is important to see your doctor. Follow these instructions at home: Activity  Rest as needed.  Try to get enough sleep. Most adults need 7-8 hours of sleep each night. Talk to your doctor about how much sleep you need each night.  Do exercises, such as arm curls and leg raises, for 30 minutes at least 2 days a week or as told by your doctor.  Think about working with a physical therapist or trainer to help you get stronger. General instructions   Take over-the-counter and prescription medicines only as told by your doctor.  Eat a healthy, well-balanced diet. This includes: ? Proteins to build muscles, such as lean meats and fish. ? Fresh fruits and vegetables. ? Carbohydrates to boost energy, such as whole grains.  Drink enough fluid to keep your pee (urine) pale yellow.  Keep all follow-up visits as told by your doctor. This is important. Contact a doctor if:  Your weakness does not get better or it gets worse.  Your weakness affects your ability to: ? Think clearly. ? Do your normal daily activities. Get help right away if you:  Have sudden weakness on one side of your face or body.  Have chest pain.  Have trouble breathing or shortness of breath.  Have problems with your vision.  Have trouble talking or swallowing.  Have trouble standing or walking.  Are light-headed.  Pass out (lose consciousness). Summary  Weakness is a lack of strength. You may feel weak all over your body or just in one part of your body.  There are many potential causes of weakness. Sometimes, the cause of your weakness may not be known.  Rest as needed, and try to get enough sleep. Most adults  need 7-8 hours of sleep each night.  Eat a healthy, well-balanced diet. This information is not intended to replace advice given to you by your health care provider. Make sure you discuss any questions you have with your health care provider. Document Released: 02/04/2008 Document Revised: 09/27/2017 Document Reviewed: 09/27/2017 Elsevier Interactive Patient Education  2019 [name] Stay Proper nutrition can help your body recover from illness and injury.   Foods and beverages high in protein, vitamins, and minerals help rebuild muscle loss, promote healing, & reduce fall risk.   In addition to eating healthy foods, a nutrition shake is an easy, delicious way to get the nutrition you need during and after your hospital stay  It is recommended that you continue to drink 2 bottles per day of:       Ensure Plus/ Boost Plus or equivalent for at least 1 month (30 days) after your hospital stay   Tips for adding a nutrition shake into your routine: As allowed, drink one with vitamins or medications instead of water or juice Enjoy one as a tasty mid-morning or afternoon snack Drink cold or make a milkshake out of it Drink one instead of milk with cereal or snacks Use as a coffee creamer   Available at the following grocery stores and pharmacies:           * [facility]          *  [place] visit: www.ensure.com/join or http://dawson-may.com/   Suggested Substitutions Ensure Plus = Boost Plus = Carnation Breakfast Essentials = Boost Compact Ensure Active Clear = Boost Breeze Glucerna Shake = Boost Glucose Control = Carnation Breakfast Essentials SUGAR FREE  National Dysphagia Diet Advanced Nutrition Therapy The purpose of this diet is to provide foods that can be successfully  and safely swallowed. This diet consists of foods that are easy to swallow because they are in bite-sized pieces (less than one inch). Foods on this diet are nearly regular textures. Very hard, sticky, or crunchy foods are avoided. A registered dietitian nutritionist can individualize this diet to provide some favorite food items in a modified form.  Tips  Cut all food you eat into pieces less than one inch in size.   Foods should be moist and still should require some chewing.  Serve moist foods to make foods easier to chew and swallow.   To keep foods moist, add small amounts of gravy, sauce, vegetable juice or cooking water, fruit juice, milk, or half and half to foods. Moist foods are easier to swallow.  Foods in large chunks or foods that are too hard to be chewed thoroughly should be avoided.  Avoid very hard, sticky, or crunchy foods.  Prepare quantities of favorite food items and freeze them in portion sizes for use later.  Reheat foods carefully so that a tough outer crust does not form on them. Foods Recommended Food Group Foods Recommended  Grains Any well-moistened breads, biscuits, muffins, pancakes, waffles, etc. Need to add adequate syrup, jelly, margarine, butter, etc. to moisten well. All well-moistened cereals. All starches including pasta and moist bread dressing. Rice if tolerated.  Protein Foods Thin sliced, tender or ground prepared, moistened red meat, including beef, pork, or lamb. Thin sliced, tender or ground  prepared, moistened poultry, including  chicken or [place] Thin sliced, tender or ground prepared, moistened seafood, including fish (salmon, herring, and sardines), shrimp, lobster, clams, and scallops. Ham or salami (made of meat or poultry). Serve chopped or cut up as needed. Hot dogs served ground. Moist thin sliced deli meats and sandwich salad filling with pieces  inch or less. Chopped or ground bacon and sausage (if tolerated) Eggs and egg  substitutes, prepared in any way. Boiled eggs served halved. Casseroles with small chunks of meat, ground meats, or tender meats. Smooth, unsalted nut and seed butters, such as peanut butter, almond butter, and sunflower seed butter. Prepared, moistened soy foods, such as tofu or tempeh Prepared, moistened meat alternatives, such as veggie burgers, and sausages based on plant protein Prepared, moistened legumes, such as dried beans, lentils, or peas  Dairy Milk, yogurt (without nuts or coconut), cottage cheese, and cheeses. Cream cheese, sour cream, and whipped topping. Frozen desserts made from low-fat milk such as pudding, custard, ice cream, sherbet, malts, and frozen yogurt. Fortified soymilk  Vegetables Fresh shredded lettuce; sliced tomatoes, finely chopped tomatoes/salads as tolerated All tender, cooked, canned or frozen, tender vegetables including dark-green, red and orange vegetables, legumes (beans) and starchy vegetables with food pieces no larger than 1 inch. All potatoes and tender, fried potatoes. Vegetable juices  Fruits All canned and cooked fruits. Crushed pineapple as tolerated and in cooked products. Soft, peeled  fresh fruits such as peaches, nectarines, kiwi, cantaloupe, honeydew, and watermelon (without seeds). Soft berries with small seeds (eg, strawberries). 100% fruit juice Raisins if cooked in allowed recipes. Pureed prunes.  Oils Olive, peanut, and canola oils; margarines and spreads; salad dressing and mayonnaise; butter, gravy, cream sauces.  Beverages Coffee, tea water, 100% fruit juice in the consistency recommended by a speech therapist.  Other Prepared foods, including all soups with tender meats, casseroles, salads, baked goods, and snacks made from recommended ingredients. All seasonings and sweeteners, including honey, jams, jellies, and preserves. Non-chewy candies without nuts, seeds, or coconut.  Foods Not Recommended   Food Group Foods Not  Recommended  Grains Dry bread, toast, and crackers, etc. Tough, thick crusty breads such as [place] bread or baguettes. Tortillas. Bread with nuts. Dry bread dressing. Coarse or dry cereals such as shredded wheat or bran flakes. Dry cakes, cookies that are chewy or very dry. Rice unless determined to be tolerated.  Protein Foods Tough or dry red meats (beef, pork, lamb) or meats with bone and/or gristle. Tough, dry poultry (chicken and [place]) Tough, dry fried meats, poultry, or fish Tough, dry, stringy deli meats, such as pastrami and corned beef. Bacon and sausage if not  tolerated Chunky nut seed butters, unless used in a tolerated recipe. Nuts and seeds, such as peanuts and almonds; pistachios and sunflower seeds.  Dairy Yogurt with nuts or coconut.  Vegetables All raw vegetables except shredded lettuce; sliced tomatoes; stringy green beans; finely chopped tomatoes/salads if not tolerated. Cooked corn and peas. Undercooked vegetables that are fibrous, tough, or stringy such as, cabbage, asparagus, and celery. Fried vegetables. Potato skins, potato chips, fried or French-fried potatoes, tough or crisp-fried potatoes.  Fruits Difficult to chew fresh fruits such as apples or pears. Stringy, high-pulp fruits such as papaya, pineapple, or mango. Fresh fruits with difficult-to-chew peels such as grapes. Uncooked dried fruits such as prunes, raisins, apricots, or coconut. Fruit leather, fruit roll-ups, fruit snacks, dried fruits.  Oils  All fats with coarse, difficult to chew, or chunky additives such as cream cheese spread with nuts or pineapple.  Beverages Liquid consistencies other than those specified by a speech therapist.  Other Nuts, seeds, coconut. Candies with nuts, seeds, or coconut. Chewy caramel or taffy-style candies.  NDD Advanced Sample 1-Day Menu  Breakfast 1/2 cup orange juice 1/2 cup dry cereal, well-moistened 1 scrambled egg 1 tablespoon melted cheese 1 moist muffin    1 teaspoon margarine 1 cup low-fat milk  Lunch 1 cup moist beef stew, in small (1 inch) chunks 1 moist biscuit  Margarine, margarine-like vegetable oil spread, 67-70% fat, tub 1/2 cup canned fruit salad 1 cookie, moistened 1 cup low-fat milk  Evening Meal 1/2 cup potato soup, made with milk 1 slice moist bread 1 teaspoon butter 3 oz moist chicken, cut up as indicated 1/2 cup pasta 1/2 cup green beans 1 slice apple pie 1/2 cup ice cream 1 cup low-fat milk  Copyright 2020  Academy of Nutrition and Dietetics. All rights reserved       Information on my medicine - ELIQUIS (apixaban)  Why was Eliquis prescribed for you? Eliquis was prescribed for you to reduce the risk of a blood clot forming that can cause a stroke if you have a medical condition called atrial fibrillation (a type of irregular heartbeat).  What do You need to know about Eliquis ? Take your Eliquis TWICE DAILY - one tablet in the morning and one tablet in the evening with  or without food. If you have difficulty swallowing the tablet whole please discuss with your pharmacist how to take the medication safely.  Take Eliquis exactly as prescribed by your doctor and DO NOT stop taking Eliquis without talking to the doctor who prescribed the medication.  Stopping may increase your risk of developing a stroke.  Refill your prescription before you run out.  After discharge, you should have regular check-up appointments with your healthcare provider that is prescribing your Eliquis.  In the future your dose may need to be changed if your kidney function or weight changes by a significant amount or as you get older.  What do you do if you miss a dose? If you miss a dose, take it as soon as you remember on the same day and resume taking twice daily.  Do not take more than one dose of ELIQUIS at the same time to make up a missed dose.  Important Safety Information A possible side effect of Eliquis is bleeding. You  should call your healthcare provider right away if you experience any of the following: ? Bleeding from an injury or your nose that does not stop. ? Unusual colored urine (red or dark brown) or unusual colored stools (red or black). ? Unusual bruising for unknown reasons. ? A serious fall or if you hit your head (even if there is no bleeding).  Some medicines may interact with Eliquis and might increase your risk of bleeding or clotting while on Eliquis. To help avoid this, consult your healthcare provider or pharmacist prior to using any new prescription or non-prescription medications, including herbals, vitamins, non-steroidal anti-inflammatory drugs (NSAIDs) and supplements.  This website has more information on Eliquis (apixaban): http://www.eliquis.com/eliquis/home

## 2018-07-27 NOTE — Plan of Care (Signed)

## 2018-07-27 NOTE — Progress Notes (Signed)
Left groin pseudoaneurysm compression has been completed. Refer to [facility] under chart review to view preliminary results.   07/27/2018  2:35 PM [name], Bonnye Fava

## 2018-07-27 NOTE — Progress Notes (Signed)
Daily Rounding Note  07/27/2018, 11:48 AM  LOS: 4 days   SUBJECTIVE:   Chief complaint: Dysphagia.  Esophageal stricture, esophageal ulcer.  No problems swallowing dysphagia 3 (soft) diet.   Pain in left groin intense after compression of L fem artery pseudoaneurysm ~ 1150 today.  .  OBJECTIVE:         Vital signs in last 24 hours:    Temp:  [98.4 F (36.9 C)-99.6 F (37.6 C)] 99.6 F (37.6 C) (05/22 0511) Pulse Rate:  [49-99] 98 (05/22 0948) Resp:  [16-20] 19 (05/22 0948) BP: (98-129)/(51-69) 129/52 (05/22 0948) SpO2:  [97 %-100 %] 100 % (05/22 0948) Weight:  [59.4 kg] 59.4 kg (05/22 0510) Last BM Date: 07/24/18 Filed Weights   07/26/18 0421 07/26/18 0943 07/27/18 0510  Weight: 59.1 kg 59 kg 59.4 kg   General: uncomfortable, laying supine after decompression   Heart: Irreg, irreg Chest: clear in front Abdomen: soft, NT, bruising at left groin.  ND.  Active BS  Extremities: no CCE Neuro/Psych:  Oriented x 3.    Intake/Output from previous day: 05/21 0701 - 05/22 0700 In: 1503 [P.O.:1200; I.V.:303] Out: 1450 [Urine:1450]  Intake/Output this shift: Total I/O In: 120 [P.O.:120] Out: 700 [Urine:700]  Lab Results: Recent Labs    07/25/18 0505 07/26/18 0427 07/27/18 0430  WBC 5.5 5.1 4.8  HGB 7.5* 8.0* 7.4*  HCT 22.5* 23.6* 22.3*  PLT 190 191 153   BMET Recent Labs    07/25/18 0505 07/26/18 0427 07/27/18 0430  NA 132* 134* 134*  K 3.9 3.8 3.9  CL 101 102 105  CO2 25 24 22   GLUCOSE 97 100* 114*  BUN 14 11 13   CREATININE 1.11 1.06 0.96  CALCIUM 8.2* 8.4* 8.3*   LFT Recent Labs    07/26/18 0427 07/27/18 0430  PROT 5.3* 5.1*  ALBUMIN 2.8* 2.8*  AST 16 17  ALT 10 11  ALKPHOS 84 93  BILITOT 1.0 1.1   PT/INR No results for input(s): LABPROT, INR in the last 72 hours. Hepatitis Panel No results for input(s): HEPBSAG, HCVAB, HEPAIGM, HEPBIGM in the last 72 hours.  Studies/Results:  Vas [place] Groin Pseudoaneurysm Result Date: 07/26/2018 Exam: Left groin Indications: Patient complains of groin pain. History: S/p catheterization by Dr. Oneida Alar 07/16/18. P  Findings: An area with well defined borders measuring 4.8 cm x 2.6 cm was visualized with ultrasound characteristics of a pseudoaneurysm. The neck measures approximately 0.4 cm wide and 0.8 cm long.  Diagnosing physician: Monica Martinez MD Electronically signed by Monica Martinez MD on 07/26/2018 at 5:32:18 PM.   --------------------------------------------------------------------------------    Final    Scheduled Meds: . aspirin EC  81 mg Oral Daily  . diltiazem  30 mg Oral Q6H  . feeding supplement (ENSURE ENLIVE)  237 mL Oral TID BM  . ketotifen  1 drop Right Eye Daily  . multivitamin with minerals  1 tablet Oral Daily  . pantoprazole (PROTONIX) IV  40 mg Intravenous Q12H  . sodium chloride flush  3 mL Intravenous Q12H  . sucralfate  1 g Oral TID WC & HS   Continuous Infusions: PRN Meds:.acetaminophen **OR** acetaminophen, hydrALAZINE, HYDROmorphone (DILAUDID) injection, ondansetron **OR** ondansetron (ZOFRAN) IV  ASSESMENT:   *    Dysphagia.  Previous history same and dilations of esophageal strictures. 07/25/2018 EGD.  Nonbleeding esophageal ulcer, biopsied.  Small HH.  Benign appearing esophageal stenosis.  Gastric stenosis at pylorus.  Normal duodenum. Esophageal stricture was  not dilated due to presence of ulcer.  *    Left groin pseudoaneurysm.  Left groin pain. 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 S/p bedside mechanical decompression in last 40 minutes  *    Normocytic anemia.  Present since 07/12/2018.  1 U PRBC during recent hospital stay.    *    Chronic Eliquis, for A. Fib.  Was receiving heparin but this was discontinued yesterday at 0927   PLAN   *   PPI BID for 8 weeks.  Currently on Protonix 40 mg BID but at home he can take Nexium 40 mg BID.  Convert from IV Protonix to po today.   Carafate liquid 1 g QID for 2 weeks.    *   Remain upright as much as possible, especially during and after meals. No eating within 3 hours of  bedtime.   *   Await biopsy results.  *   Set up fup with Dr Henrene Pastor September [id number]     [name]  07/27/2018, 11:48 AM Phone [phone]

## 2018-07-27 NOTE — Progress Notes (Signed)
Spoke with patient's spouse Dollie Mayse) regarding plan of care, with patient's permission. Questions and concerns answered.

## 2018-07-27 NOTE — Progress Notes (Signed)
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  In ED, hemodynamically stable. CBC remarkable for hemoglobin to 8.3 (baseline). CMP with mild hyponatremia to 131 (about baseline). Troponin 0 0.042. BNP 1122 (no prior to compare to). COVID-19 test negative. CXR without acute finding. Admitted for N/V/D/dysphagia.  Now s/p evaluation by GI with endoscopy as noted below. Worsening L groin pain this AM, had [place] concerning for pseudoaneurysm.  07/27/18: Patient seen and examined at his bedside.  No acute events overnight.  Reports persistent generalized weakness.  Tolerating a dysphagia 3 diet without any worsening GI symptoms.  Left femoral pseudoaneurysm with plan for ultrasound compression this morning.  Assessment/Plan: Active Problems:   Essential hypertension   Hx of CABG '84, '96, last PCI 2012   Ulcer of esophagus without bleeding   Anemia   PVD (peripheral vascular disease) (HCC)   Hyperlipidemia LDL goal <70   PAF (paroxysmal atrial fibrillation) (HCC)   Coronary artery disease   Diastolic CHF (HCC)   Chronic mesenteric ischemia (HCC)   Nausea and vomiting   Dysphagia   Malnutrition of moderate degree  Left groin pain secondary to left femoral pseudoaneurysm Status post SMA and celiac stenting on 07/16/2018 Ultrasound with pseudoaneurysm 4.8 x 2.6 cm Vascular surgery following  Possible ultrasound compression this morning by vascular surgery Continue to hold off Eliquis  Nonbleeding esophageal ulcer post EGD GI recommended Protonix 40 mg twice daily x8 weeks.  For patient's preference Nexium 40 mg twice daily x8 weeks on discharge.  Continue Carafate 4 times daily x2 weeks. Biopsies pending. Tolerating dysphagia 3 diet with no worsening GI symptoms  Chronic normocytic anemia Hemoglobin dropped this morning from 8.0-7.4 Continue to monitor H&H Eliquis on hold  Chronic diastolic CHF: UM35-36%, mild MR, mild TR, and mild pulmonary hypertension by TTE6/20/2018.Has 2+ pitting edema but no cardiopulmonary symptoms. His edema could be due to malnutrition. BNP elevated to 1120 but no baseline to compare to. -Resume home p.o. meds. -Hold home diuretics for now, takes prn at home. -Daily weight, intake output and renal function. -We will obtain TSH (wnl), prealbumin (19.2).  -Urinalysis without significant proteinuria (30 mg/dl).  Peripheral vascular disease/chronic mesenteric ischemiastatus post SMA and celiac stenting on 07/16/2018. -Continue home medications.  CAD status post CABG:Stable. No anginal symptoms. Troponin flat at 0.04. EKG with A. fib, rate controlled, RBBB, LPFB and occasional PVCs but no acute ischemic finding. -Continue home medications.  Permanent A. fib without RVR. -Continue home meds. -Hold anticoagulation right now due to pseudoaneurysm  Anemia of chronic disease:Hgb at baseline. -Continue monitoring  Hypertension:Normotensive. -Continue home meds  Right hip fracture status post ORIF: Reports progressing well and ambulating with use of a walker at home now. -PT eval  DVT prophylaxis: SCD Code Status: full  Family Communication: none at bedside Disposition Plan:  Possible discharge to home in 1 to 2 days home or when vascular surgery signs off.   Consultants:   GI  Procedures:   - Non-bleeding  esophageal ulcer. Biopsied. - Small hiatal hernia. -  Benign-appearing esophageal stenosis. - Gastric stenosis was found at the pylorus. - Normal examined duodenum. - The examination was otherwise normal. - Esophageal dilation was not performed given the active ulceration. - Advance diet as tolerated today. - Continue present medications. Pantoprazole 40 mg BID x 8 weeks. Please convert to Nexium 40 mg BID x 8 weeks on discharge, as [name] feels the Nexium works better. Resume Carafate 1 gram slurry QID x 2 weeks. [name] took one dose of Carafate previously that did not provide relief and he did not continue with treatment at that time. - Reviewed lifestyle modifications for reflux including remaining upright as much as possible and not eating within 3 hours prior to bedtime. - Await biopsy results. - No additional inpatient GI evaluation planned. [name] may follow-up with Dr. Henrene Pastor as needed on discharge.  Antimicrobials:     Anti-infectives (From admission, onward)   None     Objective: Vitals:   07/27/18 0510 07/27/18 0511 07/27/18 0633 07/27/18 0948  BP:  124/64 128/69 (!) 129/52  Pulse:  86 89 98  Resp:  20  19  Temp:  99.6 F (37.6 C)    TempSrc:  Oral    SpO2:  97%  100%  Weight: 59.4 kg     Height:        Intake/Output Summary (Last 24 hours) at 07/27/2018 1035 Last data filed at 07/27/2018 0944 Gross per 24 hour  Intake 1323 ml  Output 1850 ml  Net -527 ml   Filed Weights   07/26/18 0421 07/26/18 0943 07/27/18 0510  Weight: 59.1 kg 59 kg 59.4 kg    Exam:  . General: 73 y.o. year-old male well developed well nourished in no acute distress.  Alert and oriented x3. . Cardiovascular: Regular rate and rhythm with no rubs or gallops.  No thyromegaly or JVD noted.   Marland Kitchen Respiratory: Clear to auscultation with no wheezes or rales. Good inspiratory effort. . Abdomen: Soft nontender nondistended with normal bowel sounds x4 quadrants. . Musculoskeletal:  Trace lower extremity edema. 2/4 pulses in all 4 extremities. . Skin: Noted in the left groin and inner thighs bilaterally. Marland Kitchen Psychiatry: Mood is appropriate for condition and setting   Data Reviewed: CBC: Recent Labs  Lab 07/23/18 2045 07/24/18 0723 07/25/18 0505 07/26/18 0427 07/27/18 0430  WBC 9.5 8.4 5.5 5.1 4.8  NEUTROABS 8.3*  --   --   --   --   HGB 8.3* 8.3* 7.5* 8.0* 7.4*  HCT 25.5* 24.9* 22.5* 23.6* 22.3*  MCV 99.2 96.9 97.8 97.9 97.0  PLT 223 216 190 191 258   Basic Metabolic Panel: Recent Labs  Lab 07/23/18 2045 07/24/18 0723 07/25/18 0505 07/26/18 0427 07/27/18 0430  NA 131* 129* 132* 134* 134*  K 3.7 4.3 3.9 3.8 3.9  CL 98 97* 101 102 105  CO2 23 23 25 24 22   GLUCOSE 116* 97 97 100* 114*  BUN 20 20 14 11 13   CREATININE 1.05 1.15 1.11 1.06 0.96  CALCIUM 8.4* 8.4* 8.2* 8.4* 8.3*  MG 1.8  --  1.9 1.9 1.8   GFR: Estimated Creatinine Clearance: 56.7 mL/min (by C-G formula based on SCr of 0.96 mg/dL). Liver Function Tests: Recent Labs  Lab 07/23/18 2045 07/26/18 0427 07/27/18 0430  AST 15 16 17   ALT 12 10 11   ALKPHOS 82 84 93  BILITOT 1.2 1.0 1.1  PROT 5.3* 5.3* 5.1*  ALBUMIN 2.7* 2.8* 2.8*   No results for input(s): LIPASE, AMYLASE  in the last 168 hours. No results for input(s): AMMONIA in the last 168 hours. Coagulation Profile: No results for input(s): INR, PROTIME in the last 168 hours. Cardiac Enzymes: Recent Labs  Lab 07/23/18 2045 07/24/18 0219 07/24/18 0723  TROPONINI 0.04* 0.04* 0.04*   BNP (last 3 results) No results for input(s): PROBNP in the last 8760 hours. HbA1C: No results for input(s): HGBA1C in the last 72 hours. CBG: Recent Labs  Lab 07/26/18 2102 07/27/18 0607  GLUCAP 127* 106*   Lipid Profile: No results for input(s): CHOL, HDL, LDLCALC, TRIG, CHOLHDL, LDLDIRECT in the last 72 hours. Thyroid Function Tests: Recent Labs    07/24/18 1543  TSH 0.857   Anemia Panel: No results for input(s): VITAMINB12,  FOLATE, FERRITIN, TIBC, IRON, RETICCTPCT in the last 72 hours. Urine analysis:    Component Value Date/Time   COLORURINE YELLOW 07/24/2018 0108   APPEARANCEUR CLEAR 07/24/2018 0108   LABSPEC 1.017 07/24/2018 0108   PHURINE 6.0 07/24/2018 0108   GLUCOSEU NEGATIVE 07/24/2018 0108   HGBUR NEGATIVE 07/24/2018 0108   BILIRUBINUR NEGATIVE 07/24/2018 0108   KETONESUR NEGATIVE 07/24/2018 0108   PROTEINUR 30 (A) 07/24/2018 0108   NITRITE NEGATIVE 07/24/2018 0108   LEUKOCYTESUR NEGATIVE 07/24/2018 0108   Sepsis Labs: @LABRCNTIP (procalcitonin:4,lacticidven:4)  ) Recent Results (from the past 240 hour(s))  SARS Coronavirus 2 (CEPHEID - Performed in [facility] hospital lab), Hosp Order     Status: None   Collection Time: 07/23/18  8:45 PM  Result Value Ref Range Status   SARS Coronavirus 2 NEGATIVE NEGATIVE Final    Comment: (NOTE) If result is NEGATIVE SARS-CoV-2 target nucleic acids are NOT DETECTED. The SARS-CoV-2 RNA is generally detectable in upper and lower  respiratory specimens during the acute phase of infection. The lowest  concentration of SARS-CoV-2 viral copies this assay can detect is 250  copies / mL. A negative result does not preclude SARS-CoV-2 infection  and should not be used as the sole basis for treatment or other  patient management decisions.  A negative result may occur with  improper specimen collection / handling, submission of specimen other  than nasopharyngeal swab, presence of viral mutation(s) within the  areas targeted by this assay, and inadequate number of viral copies  (<250 copies / mL). A negative result must be combined with clinical  observations, patient history, and epidemiological information. If result is POSITIVE SARS-CoV-2 target nucleic acids are DETECTED. The SARS-CoV-2 RNA is generally detectable in upper and lower  respiratory specimens dur ing the acute phase of infection.  Positive  results are indicative of active infection with  SARS-CoV-2.  Clinical  correlation with patient history and other diagnostic information is  necessary to determine patient infection status.  Positive results do  not rule out bacterial infection or co-infection with other viruses. If result is PRESUMPTIVE POSTIVE SARS-CoV-2 nucleic acids MAY BE PRESENT.   A presumptive positive result was obtained on the submitted specimen  and confirmed on repeat testing.  While 2019 novel coronavirus  (SARS-CoV-2) nucleic acids may be present in the submitted sample  additional confirmatory testing may be necessary for epidemiological  and / or clinical management purposes  to differentiate between  SARS-CoV-2 and other Sarbecovirus currently known to infect humans.  If clinically indicated additional testing with an alternate test  methodology [phone]) is advised. The SARS-CoV-2 RNA is generally  detectable in upper and lower respiratory sp ecimens during the acute  phase of infection. The expected result is Negative. Fact Sheet  for Patients:  StrictlyIdeas.no Fact Sheet for Healthcare Providers: BankingDealers.co.za This test is not yet approved or cleared by the [place] FDA and has been authorized for detection and/or diagnosis of SARS-CoV-2 by FDA under an Emergency Use Authorization (EUA).  This EUA will remain in effect (meaning this test can be used) for the duration of the COVID-19 declaration under Section 564(b)(1) of the Act, 21 U.S.C. section 360bbb-3(b)(1), unless the authorization is terminated or revoked sooner. Performed at [facility], [place] [address] [place], [place] [address]   Urine culture     Status: None   Collection Time: 07/24/18  1:08 AM  Result Value Ref Range Status   Specimen Description URINE, CATHETERIZED  Final   Special Requests STERILE CONTAINER  Final   Culture   Final    NO GROWTH Performed at [facility], [address] [place],  [place] [address]    Report Status 07/25/2018 FINAL  Final      Studies: Vas [place] Groin Pseudoaneurysm  Result Date: 07/26/2018  ARTERIAL PSEUDOANEURYSM  Exam: Left groin Indications: Patient complains of groin pain. History: S/p catheterization by Dr. Oneida Alar 07/16/18. Performing Technologist: Abram Sander RVS  Examination Guidelines: A complete evaluation includes B-mode imaging, spectral Doppler, color Doppler, and power Doppler as needed of all accessible portions of each vessel. Bilateral testing is considered an integral part of a complete examination. Limited examinations for reoccurring indications may be performed as noted. +-----------+----------+--------+------+----------+ Left DuplexPSV (cm/s)WaveformPlaqueComment(s) +-----------+----------+--------+------+----------+ CFA           207    biphasic                 +-----------+----------+--------+------+----------+ Prox SFA       89    biphasic                 +-----------+----------+--------+------+----------+ Left Vein comments:  Findings: An area with well defined borders measuring 4.8 cm x 2.6 cm was visualized with ultrasound characteristics of a pseudoaneurysm. The neck measures approximately 0.4 cm wide and 0.8 cm long.  Diagnosing physician: Monica Martinez MD Electronically signed by Monica Martinez MD on 07/26/2018 at 5:32:18 PM.   --------------------------------------------------------------------------------    Final     Scheduled Meds: . aspirin EC  81 mg Oral Daily  . diltiazem  30 mg Oral Q6H  . feeding supplement (ENSURE ENLIVE)  237 mL Oral TID BM  . ketotifen  1 drop Right Eye Daily  . multivitamin with minerals  1 tablet Oral Daily  . pantoprazole (PROTONIX) IV  40 mg Intravenous Q12H  . sodium chloride flush  3 mL Intravenous Q12H  . sucralfate  1 g Oral TID WC & HS    Continuous Infusions:   LOS: 4 days     Kayleen Memos, MD [facility] Pager [phone]  If 7PM-7AM, please contact  night-coverage [email or web address] Password [facility] 07/27/2018, 10:35 AM

## 2018-07-27 NOTE — Progress Notes (Addendum)
I supervised compression of L femoral artery pseudoaneurysm.  Patient was given IV dilaudid prior to procedure.  Compression was held for at least 15 minutes.  Pseudoaneurysm sac no longer has flow.  L ATA and PTA signals unchanged compared to pre-procedure.  Plan is to recheck with duplex in about 1-2 hours then again tomorrow am.  Keep patient on bedrest for now.  If unsuccessful, plan will be to attempt thrombin injection vs surgically repair.  Dagoberto Ligas, PA-C [facility] [phone] 07/27/2018  11:57 AM

## 2018-07-28 ENCOUNTER — Inpatient Hospital Stay (HOSPITAL_COMMUNITY): Payer: Medicare Other

## 2018-07-28 DIAGNOSIS — I724 Aneurysm of artery of lower extremity: Secondary | ICD-10-CM

## 2018-07-28 LAB — HEMOGLOBIN AND HEMATOCRIT, BLOOD
HCT: 22.9 % — ABNORMAL LOW (ref 39.0–52.0)
Hemoglobin: 7.6 g/dL — ABNORMAL LOW (ref 13.0–17.0)

## 2018-07-28 MED ORDER — PRAVASTATIN SODIUM 10 MG PO TABS
20.0000 mg | ORAL_TABLET | Freq: Every day | ORAL | Status: DC
  Administered 2018-07-28: 20 mg via ORAL
  Filled 2018-07-28: qty 2

## 2018-07-28 MED ORDER — RANOLAZINE ER 500 MG PO TB12
500.0000 mg | ORAL_TABLET | Freq: Two times a day (BID) | ORAL | Status: DC
  Administered 2018-07-28 – 2018-07-29 (×2): 500 mg via ORAL
  Filled 2018-07-28 (×3): qty 1

## 2018-07-28 MED ORDER — APIXABAN 5 MG PO TABS
5.0000 mg | ORAL_TABLET | Freq: Two times a day (BID) | ORAL | Status: DC
  Administered 2018-07-28 – 2018-07-29 (×3): 5 mg via ORAL
  Filled 2018-07-28 (×3): qty 1

## 2018-07-28 MED ORDER — EZETIMIBE 10 MG PO TABS
10.0000 mg | ORAL_TABLET | Freq: Every day | ORAL | Status: DC
  Administered 2018-07-28 – 2018-07-29 (×2): 10 mg via ORAL
  Filled 2018-07-28 (×2): qty 1

## 2018-07-28 MED ORDER — DUTASTERIDE 0.5 MG PO CAPS
0.5000 mg | ORAL_CAPSULE | Freq: Every day | ORAL | Status: DC
  Administered 2018-07-28 – 2018-07-29 (×2): 0.5 mg via ORAL
  Filled 2018-07-28 (×2): qty 1

## 2018-07-28 MED ORDER — CALCIUM CARBONATE ANTACID 500 MG PO CHEW
1.0000 | CHEWABLE_TABLET | Freq: Three times a day (TID) | ORAL | Status: DC | PRN
  Administered 2018-07-28: 200 mg via ORAL
  Filled 2018-07-28: qty 1

## 2018-07-28 MED ORDER — SUCRALFATE 1 GM/10ML PO SUSP
1.0000 g | Freq: Every day | ORAL | Status: DC | PRN
  Administered 2018-07-28: 1 g via ORAL

## 2018-07-28 NOTE — Progress Notes (Signed)
VASCULAR LAB PRELIMINARY  PRELIMINARY  PRELIMINARY  PRELIMINARY  Left ultrasound of groin post pseudo compression completed.    Preliminary report:  See CV Proc for preliminary results.  Lora Chavers, RVT 07/28/2018, 8:55 AM

## 2018-07-28 NOTE — Progress Notes (Signed)
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  In ED, hemodynamically stable. CBC remarkable for hemoglobin to 8.3 (baseline). CMP with mild hyponatremia to 131 (about baseline). Troponin 0 0.042. BNP 1122 (no prior to compare to). COVID-19 test negative. CXR without acute finding. Admitted for N/V/D/dysphagia.  Now s/p evaluation by GI with endoscopy as noted below. Worsening L groin pain this AM, had [place] concerning for pseudoaneurysm.  07/28/18: Patient seen and examined at his bedside.  No acute events overnight.  Generalized weakness mildly improving but persistent.  Post left femoral pseudoaneurysm compression on 07/27/2018, repeated on 3/71/6967 without complication.  Will resume Eliquis tonight as recommended by vascular surgery.   Assessment/Plan: Active Problems:   Essential hypertension   Hx of CABG '84, '96, last PCI 2012   Ulcer of esophagus without bleeding   Anemia   PVD (peripheral vascular disease) (HCC)   Hyperlipidemia LDL goal <70   PAF (paroxysmal atrial fibrillation) (HCC)   Coronary artery disease   Diastolic CHF (HCC)   Chronic mesenteric ischemia (HCC)   Nausea and vomiting   Dysphagia   Malnutrition of moderate degree  Left groin pain secondary to left femoral pseudoaneurysm post compression x2. Status post SMA and celiac stenting on 07/16/2018 Ultrasound with  pseudoaneurysm 4.8 x 2.6 cm Vascular surgery following Ultrasound compression x2 on 07/27/2018 and 07/28/2018 by vascular surgery Resume Eliquis tonight as recommended by vascular surgery  Nonbleeding esophageal ulcer post EGD GI recommended Protonix 40 mg twice daily x8 weeks.  For patient's preference Nexium 40 mg twice daily x8 weeks on discharge.  Continue Carafate 4 times daily x2 weeks. Biopsies pending. Tolerating dysphagia 3 diet with no worsening GI symptoms  Chronic normocytic anemia Hemoglobin dropped this morning from 8.0-7.4 H&H stable 7.6/22.9 Eliquis on hold  Chronic diastolic CHF: EL38-10%, mild MR, mild TR, and mild pulmonary hypertension by TTE6/20/2018.Has 2+ pitting edema but no cardiopulmonary symptoms. His edema could be due to malnutrition. BNP elevated to 1120 but no baseline to compare to. -Resume home p.o. meds. -Hold home diuretics for now, takes prn at home. -Daily weight, intake output and renal function. -We will obtain TSH (wnl), prealbumin (19.2).  -Urinalysis without significant proteinuria (30 mg/dl).  Peripheral vascular disease/chronic mesenteric ischemiastatus post SMA and celiac stenting on 07/16/2018. -Continue home medications.  CAD status post CABG:Stable. No anginal symptoms. Troponin flat at 0.04. EKG with A. fib, rate controlled, RBBB, LPFB and occasional PVCs but no acute ischemic finding. -Continue home medications.  Permanent A. fib without RVR. -Continue home meds. -Hold anticoagulation right now due to pseudoaneurysm  Anemia of chronic disease:Hgb at baseline. -Continue monitoring  Hypertension:Normotensive. -Continue home meds  Right hip fracture status post ORIF: Reports progressing well and ambulating with use of a walker at home now. -PT eval  DVT prophylaxis:  Eliquis Code Status: full  Family Communication: none at bedside Disposition Plan:  Monitor H&H.  Currently on bedrest.  Possible discharge to  home when no longer on bedrest with clinical improvement.  Awaiting PT recommendations.  Consultants:   GI  Procedures:   - Non-bleeding esophageal ulcer. Biopsied. - Small hiatal hernia. - Benign-appearing esophageal stenosis. - Gastric stenosis was found at the pylorus. - Normal examined duodenum. - The examination was otherwise normal. - Esophageal dilation was not performed given the active ulceration. - Advance diet as tolerated today. - Continue present medications. Pantoprazole 40 mg BID x 8 weeks. Please convert to Nexium 40 mg BID x 8 weeks on discharge, as Mr. Leinen feels the Nexium works better. Resume Carafate 1 gram slurry QID x 2 weeks. Mr. Febus took one dose of Carafate previously that did not provide relief and he did not continue with treatment at that time. - Reviewed lifestyle modifications for reflux including remaining upright as much as possible and not eating within 3 hours prior to bedtime. - Await biopsy results. - No additional inpatient GI evaluation planned. Mr. Mossa may follow-up with Dr. Henrene Pastor as needed on discharge.  Antimicrobials:     Anti-infectives (From admission, onward)   None     Objective: Vitals:   07/28/18 0320 07/28/18 0422 07/28/18 0612 07/28/18 1207  BP: (!) 108/59  (!) 113/56 119/60  Pulse: 99  96 94  Resp: 16  16 18   Temp: 98.3 F (36.8 C)   98.4 F (36.9 C)  TempSrc: Oral   Oral  SpO2: 98%  98% 100%  Weight:  58.3 kg    Height:        Intake/Output Summary (Last 24 hours) at 07/28/2018 1224 Last data filed at 07/28/2018 1208 Gross per 24 hour  Intake 1120 ml  Output 1800 ml  Net -680 ml   Filed Weights   07/26/18 0943 07/27/18 0510 07/28/18 0422  Weight: 59 kg 59.4 kg 58.3 kg    Exam:   General: 73 y.o. year-old male chronically ill-appearing in no acute distress.  Alert and oriented x3.    Cardiovascular: Regular rate and rhythm with no rubs or gallops.  No JVD or thyromegaly noted.  Respiratory:  Clear to auscultation with no wheezes or rales.  Poor inspiratory effort.  Abdomen: Soft nontender nondistended with normal bowel sounds x4 quadrants.    Musculoskeletal: Trace lower extremity edema in lower extremities bilaterally.  Skin: Bruising noted in the left groin and inner thighs bilaterally.  Psychiatry: Mood is appropriate for condition and setting.   Data Reviewed: CBC: Recent Labs  Lab 07/23/18 2045 07/24/18 0723 07/25/18 0505 07/26/18 0427 07/27/18 0430 07/28/18 0625  WBC 9.5 8.4 5.5 5.1 4.8  --   NEUTROABS 8.3*  --   --   --   --   --   HGB 8.3* 8.3* 7.5* 8.0* 7.4* 7.6*  HCT 25.5* 24.9* 22.5* 23.6* 22.3* 22.9*  MCV 99.2 96.9 97.8 97.9 97.0  --   PLT 223 216 190 191 153  --    Basic Metabolic Panel: Recent Labs  Lab 07/23/18 2045 07/24/18 0723 07/25/18 0505 07/26/18 0427 07/27/18 0430  NA 131* 129* 132* 134* 134*  K 3.7 4.3 3.9 3.8 3.9  CL 98 97* 101 102 105  CO2 23 23 25 24 22   GLUCOSE 116* 97 97 100* 114*  BUN 20 20 14 11 13   CREATININE 1.05 1.15 1.11 1.06 0.96  CALCIUM 8.4* 8.4* 8.2* 8.4* 8.3*  MG 1.8  --  1.9 1.9 1.8   GFR: Estimated Creatinine Clearance: 55.7 mL/min (by C-G formula based on SCr of 0.96 mg/dL). Liver Function Tests: Recent Labs  Lab 07/23/18 2045 07/26/18 0427 07/27/18 0430  AST 15 16 17   ALT 12 10 11   ALKPHOS 82 84 93  BILITOT 1.2 1.0 1.1  PROT 5.3* 5.3* 5.1*  ALBUMIN 2.7* 2.8* 2.8*   No results for input(s): LIPASE, AMYLASE in the last 168 hours. No results for input(s): AMMONIA in the last 168 hours. Coagulation Profile: No results for input(s): INR, PROTIME in the last 168 hours. Cardiac Enzymes: Recent Labs  Lab 07/23/18 2045 07/24/18 0219 07/24/18 0723  TROPONINI 0.04* 0.04* 0.04*   BNP (last 3 results) No results for input(s): PROBNP in the last 8760 hours. HbA1C: No results for input(s): HGBA1C in the last 72 hours. CBG: Recent Labs  Lab 07/26/18 2102 07/27/18 0607  GLUCAP 127* 106*   Lipid  Profile: No results for input(s): CHOL, HDL, LDLCALC, TRIG, CHOLHDL, LDLDIRECT in the last 72 hours. Thyroid Function Tests: No results for input(s): TSH, T4TOTAL, FREET4, T3FREE, THYROIDAB in the last 72 hours. Anemia Panel: No results for input(s): VITAMINB12, FOLATE, FERRITIN, TIBC, IRON, RETICCTPCT in the last 72 hours. Urine analysis:    Component Value Date/Time   COLORURINE YELLOW 07/24/2018 0108   APPEARANCEUR CLEAR 07/24/2018 0108   LABSPEC 1.017 07/24/2018 0108   PHURINE 6.0 07/24/2018 0108   GLUCOSEU NEGATIVE 07/24/2018 0108   HGBUR NEGATIVE 07/24/2018 0108   BILIRUBINUR NEGATIVE 07/24/2018 0108   KETONESUR NEGATIVE 07/24/2018 0108   PROTEINUR 30 (A) 07/24/2018 0108   NITRITE NEGATIVE 07/24/2018 0108   LEUKOCYTESUR NEGATIVE 07/24/2018 0108   Sepsis Labs: @LABRCNTIP (procalcitonin:4,lacticidven:4)  ) Recent Results (from the past 240 hour(s))  SARS Coronavirus 2 (CEPHEID - Performed in [facility] hospital lab), Hosp Order     Status: None   Collection Time: 07/23/18  8:45 PM  Result Value Ref Range Status   SARS Coronavirus 2 NEGATIVE NEGATIVE Final    Comment: (NOTE) If result is NEGATIVE SARS-CoV-2 target nucleic acids are NOT DETECTED. The SARS-CoV-2 RNA is generally detectable in upper and lower  respiratory specimens during the acute phase of infection. The lowest  concentration of SARS-CoV-2 viral copies this assay can detect is 250  copies / mL. A negative result does not preclude SARS-CoV-2 infection  and should not be used as the sole basis for treatment or other  patient management decisions.  A negative result may occur with  improper specimen collection / handling, submission of specimen other  than nasopharyngeal swab, presence of viral mutation(s) within the  areas targeted by this assay, and inadequate number of viral copies  (<250 copies / mL). A negative result must be combined with clinical  observations, patient history, and epidemiological  information. If result is POSITIVE SARS-CoV-2 target nucleic acids are DETECTED. The SARS-CoV-2 RNA is generally detectable in upper and lower  respiratory specimens dur ing the acute phase of infection.  Positive  results are indicative of active infection with SARS-CoV-2.  Clinical  correlation with patient history and other diagnostic information is  necessary to determine patient infection status.  Positive results do  not rule out bacterial infection or co-infection with other viruses. If result is PRESUMPTIVE POSTIVE SARS-CoV-2 nucleic acids MAY BE PRESENT.   A presumptive positive result was obtained on the submitted specimen  and confirmed on repeat testing.  While 2019 novel coronavirus  (SARS-CoV-2) nucleic acids may be present in the submitted sample  additional confirmatory testing may be necessary for epidemiological  and / or clinical management purposes  to differentiate between  SARS-CoV-2 and other Sarbecovirus currently known to infect humans.  If clinically indicated  additional testing with an alternate test  methodology [phone]) is advised. The SARS-CoV-2 RNA is generally  detectable in upper and lower respiratory sp ecimens during the acute  phase of infection. The expected result is Negative. Fact Sheet for Patients:  StrictlyIdeas.no Fact Sheet for Healthcare Providers: BankingDealers.co.za This test is not yet approved or cleared by the [place] FDA and has been authorized for detection and/or diagnosis of SARS-CoV-2 by FDA under an Emergency Use Authorization (EUA).  This EUA will remain in effect (meaning this test can be used) for the duration of the COVID-19 declaration under Section 564(b)(1) of the Act, 21 U.S.C. section 360bbb-3(b)(1), unless the authorization is terminated or revoked sooner. Performed at [facility], [place] [address] [place], [place] [address]   Urine culture     Status:  None   Collection Time: 07/24/18  1:08 AM  Result Value Ref Range Status   Specimen Description URINE, CATHETERIZED  Final   Special Requests STERILE CONTAINER  Final   Culture   Final    NO GROWTH Performed at [facility], [address], [place], [place] [address]    Report Status 07/25/2018 FINAL  Final      Studies: Arterial Pseudoaneurysm Compression  Result Date: 07/27/2018  ARTERIAL PSEUDOANEURYSM  Indications: Patient complains of groin pain, bruit and bruising. History: S/p catheterization Visceral angiography on 07/16/18, s/p celiac and SMA stenting, RRA stenting and CABG. Performing Technologist: Oda Cogan RDMS, RVT  Examination Guidelines: A complete evaluation includes B-mode imaging, spectral Doppler, color Doppler, and power Doppler as needed of all accessible portions of each vessel. Bilateral testing is considered an integral part of a complete examination. Limited examinations for reoccurring indications may be performed as noted.  Right Vein comments: +-----------+----------+--------+------+----------+  Left Duplex PSV (cm/s) Waveform Plaque Comment(s)  +-----------+----------+--------+------+----------+  CFA            154                                 +-----------+----------+--------+------+----------+ Left Vein comments: Patent common femoral vein status post compression.  Findings: An area with well defined borders measuring 4.8 cm x 2.6 cm was visualized arising off of the common femoral artery with ultrasound characteristics of a pseudoaneurysm.  Summary: Successful closure of left groin partially thrombosed pseudoaneurysm with ultrasound guided compression for 15 minutes with Dagoberto Ligas, PA.  The left common femoral, posterior tibial and anterior tibial arteries are patent status post pseudoaneurysm compression. Diagnosing physician: Deitra Mayo MD Electronically signed by Deitra Mayo MD on 07/27/2018 at 3:44:16 PM.    --------------------------------------------------------------------------------    Final    Vas [place] Lower Extremity Arterial Duplex  Result Date: 07/28/2018 LOWER EXTREMITY ARTERIAL DUPLEX STUDY Indications: Pseudoaneurysm compression 07/27/18.  Current ABI: n/a Comparison Study: Prior study done 07/27/18 is available for comparison Performing Technologist: Sharion Dove RVS  Examination Guidelines: A complete evaluation includes B-mode imaging, spectral Doppler, color Doppler, and power Doppler as needed of all accessible portions of each vessel. Bilateral testing is considered an integral part of a complete examination. Limited examinations for reoccurring indications may be performed as noted.  Summary: Right: Psuedonaneurysm remains closed post compression 07/27/18.  Electronically signed by Deitra Mayo MD on 07/28/2018 at 12:07:51 PM.    Final     Scheduled Meds:  apixaban  5 mg Oral BID   aspirin EC  81 mg Oral Daily   diltiazem  30 mg Oral Q6H   feeding supplement (ENSURE ENLIVE)  237 mL Oral TID BM   ketotifen  1 drop Right Eye Daily   multivitamin with minerals  1 tablet Oral Daily   pantoprazole  40 mg Oral BID   sodium chloride flush  3 mL Intravenous Q12H   sucralfate  1 g Oral TID WC & HS    Continuous Infusions:   LOS: 5 days     Kayleen Memos, MD [facility] Pager [phone]  If 7PM-7AM, please contact night-coverage [email or web address] Password [facility] 07/28/2018, 12:24 PM

## 2018-07-28 NOTE — Progress Notes (Signed)
   VASCULAR SURGERY ASSESSMENT & PLAN:   LEFT FEMORAL PSEUDOANEURYSM: This patient underwent angioplasty and stenting of the celiac artery and superior mesenteric artery via a left femoral approach on 07/16/2018.  He presented with a pseudoaneurysm in the left groin underwent successful compression yesterday.   Vascular surgery will be available as needed.  He has follow-up arranged with Dr. Oneida Alar.   SUBJECTIVE:   No complaints this morning  PHYSICAL EXAM:   Vitals:   07/27/18 2354 07/28/18 0320 07/28/18 0422 07/28/18 0612  BP: (!) 111/58 (!) 108/59  (!) 113/56  Pulse:  99    Resp:  16    Temp:  98.3 F (36.8 C)    TempSrc:  Oral    SpO2:  98%    Weight:   58.3 kg   Height:       Left groin is soft.  No bruit.  LABS:   Lab Results  Component Value Date   WBC 4.8 07/27/2018   HGB 7.4 (L) 07/27/2018   HCT 22.3 (L) 07/27/2018   MCV 97.0 07/27/2018   PLT 153 07/27/2018   Lab Results  Component Value Date   CREATININE 0.96 07/27/2018   Lab Results  Component Value Date   INR 1.08 01/24/2011   CBG (last 3)  Recent Labs    07/26/18 2102 07/27/18 0607  GLUCAP 127* 106*    PROBLEM LIST:    Active Problems:   Essential hypertension   Hx of CABG '84, '96, last PCI 2012   Ulcer of esophagus without bleeding   Anemia   PVD (peripheral vascular disease) (HCC)   Hyperlipidemia LDL goal <70   PAF (paroxysmal atrial fibrillation) (HCC)   Coronary artery disease   Diastolic CHF (HCC)   Chronic mesenteric ischemia (HCC)   Nausea and vomiting   Dysphagia   Malnutrition of moderate degree   CURRENT MEDS:   . aspirin EC  81 mg Oral Daily  . diltiazem  30 mg Oral Q6H  . feeding supplement (ENSURE ENLIVE)  237 mL Oral TID BM  . ketotifen  1 drop Right Eye Daily  . multivitamin with minerals  1 tablet Oral Daily  . pantoprazole  40 mg Oral BID  . sodium chloride flush  3 mL Intravenous Q12H  . sucralfate  1 g Oral TID WC & HS    Deitra Mayo Beeper:  [phone] Office: [phone] 07/28/2018

## 2018-07-29 LAB — HEMOGLOBIN AND HEMATOCRIT, BLOOD
HCT: 24.4 % — ABNORMAL LOW (ref 39.0–52.0)
Hemoglobin: 8 g/dL — ABNORMAL LOW (ref 13.0–17.0)

## 2018-07-29 MED ORDER — DILTIAZEM HCL 30 MG PO TABS: 30.0000 mg | ORAL_TABLET | Freq: Four times a day (QID) | ORAL | 0 refills | Status: DC

## 2018-07-29 MED ORDER — CALCIUM CARBONATE ANTACID 500 MG PO CHEW: 1.0000 | CHEWABLE_TABLET | Freq: Three times a day (TID) | ORAL | 0 refills | Status: DC | PRN

## 2018-07-29 MED ORDER — ESOMEPRAZOLE MAGNESIUM 40 MG PO CPDR: 40.0000 mg | DELAYED_RELEASE_CAPSULE | Freq: Two times a day (BID) | ORAL | 0 refills | Status: DC

## 2018-07-29 MED ORDER — SUCRALFATE 1 GM/10ML PO SUSP: 1.0000 g | Freq: Three times a day (TID) | ORAL | 1 refills | Status: DC

## 2018-07-29 MED ORDER — ASPIRIN 81 MG PO TBEC: 81.0000 mg | DELAYED_RELEASE_TABLET | Freq: Every day | ORAL | 0 refills | Status: DC

## 2018-07-29 MED ORDER — SUCRALFATE 1 GM/10ML PO SUSP: 1.0000 g | Freq: Three times a day (TID) | ORAL | 0 refills | Status: DC

## 2018-07-29 NOTE — Discharge Summary (Signed)
Discharge Summary  [name] ZOX:[id number] DOB: [birth year]  PCP: Garwin Brothers, MD  Admit date: 07/23/2018 Discharge date: 07/29/2018  Time spent: 35 minutes  Recommendations for Outpatient Follow-up:  1. Please follow-up with vascular surgery 2. Please follow-up with cardiology 3. Please follow-up with your orthopedic surgeon 4. Please follow-up with your primary care provider 5. Take your medications as prescribed 6. Please continue physical therapy at home 7. Fall precautions  Discharge Diagnoses:  Active Hospital Problems   Diagnosis Date Noted   Malnutrition of moderate degree 07/25/2018   Dysphagia 07/24/2018   Nausea and vomiting 07/23/2018   Chronic mesenteric ischemia (HCC)    Diastolic CHF ([facility]) 81/19/1478   PAF (paroxysmal atrial fibrillation) ([facility])    Hyperlipidemia LDL goal <70 01/16/2014   PVD (peripheral vascular disease) ([facility]) 01/26/2011   Anemia 01/26/2011   Coronary artery disease 01/24/2011   Ulcer of esophagus without bleeding 01/30/2008   Essential hypertension 04/24/2007   Hx of CABG '84, '96, last PCI 2012 04/24/2007    Resolved Hospital Problems  No resolved problems to display.    Discharge Condition: Stable  Diet recommendation: Heart healthy diet.  GI recommendations: - Continue present medications. Pantoprazole 40 mg BID x 8 weeks. Please convert to Nexium 40 mg BID x 8 weeks on discharge, as [name] feels the Nexium works better. Resume Carafate 1 gram slurry QID x 2 weeks. [name] took one dose of Carafate previously that did not provide relief and he did not continue with treatment at that time. - Reviewed lifestyle modifications for reflux including remaining upright as much as possible and not eating within 3 hours prior to bedtime. - Await biopsy results. - No additional inpatient GI evaluation planned. [name] may follow-up with Dr. Henrene Pastor as needed on discharge.     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  In ED, hemodynamically stable. CBC remarkable for hemoglobin to 8.3 (baseline). CMP with mild hyponatremia to 131 (about baseline). Troponin 0.042. BNP 1122 (no prior to compare to). COVID-19 test negative. CXR without acute finding. Admitted for N/V/D/dysphagia.  S/p evaluation by GI with endoscopy on 07/25/2018 revealing nonbleeding esophageal ulcer, biopsied.  Also benign appearing esophageal stenosis.  Gastric stenosis at the pylorus.  Normal duodenum.  Esophageal stricture was not dilated due to presence of ulcer per GI.    Worsening L groin pain on 07/26/2018 this AM, had [place] concerning for pseudoaneurysm.  Vascular surgery consulted and followed, completed compression of left femoral false aneurysm x2.  Follow-up duplex scan done yesterday showed that the aneurysm was still successfully closed.  Eliquis restarted on 07/28/2018 PM.  Okay to discharge home on 07/29/2018 per vascular surgery.  07/29/18: Patient seen and examined at his bedside.  States he feels well like he is back to his normal self.  No acute events overnight.  Post left femoral pseudoaneurysm compression on 07/27/2018, repeated on 2/95/6213 without complication.  Vital signs and labs reviewed and are stable.  He has no new concerns.  He wants to go home.  On the day of discharge, the patient was  hemodynamically stable.  He will need to follow-up with vascular surgery, his cardiologist, orthopedic surgery, GI, and his primary care provider posthospitalization.  Patient will also need to take his medications as prescribed and continue physical therapy.  Fall precautions. Marland Kitchen    Hospital Course:  Active Problems:   Essential hypertension   Hx of CABG '84, '96, last PCI 2012   Ulcer of esophagus without bleeding   Anemia   PVD (peripheral vascular disease) (HCC)   Hyperlipidemia LDL goal <70   PAF (paroxysmal atrial fibrillation) (HCC)   Coronary artery disease   Diastolic CHF (HCC)   Chronic mesenteric ischemia (HCC)   Nausea and vomiting   Dysphagia   Malnutrition of moderate degree  Resolved left groin pain secondary to left femoral pseudoaneurysm post compression x2. Status post SMA and celiac stenting on 07/16/2018 Ultrasound with pseudoaneurysm 4.8 x 2.6 cm Vascular surgery followed and will follow-up outpatient Successful ultrasound compression x2 on 07/27/2018 and 07/28/2018 by vascular surgery Resumed Eliquis in the evening of 07/28/2018 as recommended by vascular surgery  Nonbleeding esophageal ulcer post EGD GI recommended Protonix 40 mg twice daily x8 weeks.  For patient's preference Nexium 40 mg twice daily x8 weeks on discharge.  Continue Carafate 4 times daily x2 weeks. Biopsies pending. Tolerating dysphagia 3 diet with no worsening GI symptoms Follow-up with Dr. Henrene Pastor outpatient  Chronic normocytic anemia/anemia of chronic disease Hemoglobin stable at 8.0 on 07/29/2018 No sign of overt bleeding Follow-up with your PCP   Chronic diastolic CHF: FA21-30%, mild MR, mild TR, and mild pulmonary hypertension by TTE6/20/2018.Has 2+ pitting edema but no cardiopulmonary symptoms. His edema could be due to malnutrition. BNP elevated to 1120 but no baseline to compare to. -Resume home p.o. meds. -Hold home diuretics for now, takes prn at home. -Daily weight,  intake output and renal function. -We will obtain TSH (wnl), prealbumin (19.2).  -Urinalysis without significant proteinuria (30 mg/dl).  Peripheral vascular disease/chronic mesenteric ischemiastatus post SMA and celiac stenting on 07/16/2018. -Continue home medications.  CAD status post CABG:Stable. No anginal symptoms. Troponin flat at 0.04. EKG with A. fib, rate controlled, RBBB, LPFB and occasional PVCs but no acute ischemic finding. -Continue home medications.  Permanent A. fib without RVR. -Continue home meds.  Hypertension:Blood pressure is normotensive.  -Continue home meds  Right hip fracture status post ORIF: Reports progressing well and ambulating with use of a walker. -Continue PT -Fall precautions   Code Status:full    Consultants:  GI, vascular surgery  Procedures: - Post left femoral pseudoaneurysm compression on 07/27/2018, repeated on 07/28/2018. EGD done on 07/25/2018 - Non-bleeding esophageal ulcer. Biopsied. -Small hiatal hernia. - Benign-appearing esophageal stenosis. - Gastric stenosis was found at the pylorus. - Normal examined duodenum. - The examination was otherwise normal. - Esophageal dilation was not performed given the active ulceration. - Advance diet as tolerated today. - Continue present medications. Pantoprazole 40 mg BID x 8 weeks. Please convert to Nexium 40 mg BID x 8 weeks on discharge, as [name] feels the Nexium works better. Resume Carafate 1 gram slurry QID x 2 weeks. [name] took one dose of Carafate previously that did not provide relief and he did not continue with treatment at that time. - Reviewed lifestyle modifications for reflux including remaining upright as much as possible and not eating  within 3 hours prior to bedtime. - Await biopsy results. - No additional inpatient GI evaluation planned. [name] may follow-up with Dr. Henrene Pastor as needed on discharge.  Antimicrobials:      Anti-infectives(From admission, onward)   None       Discharge Exam: BP 118/81 (BP Location: Right Arm)    Pulse 91    Temp 98 F (36.7 C) (Oral)    Resp 20    Ht 5\' 5"  (1.651 m)    Wt 57.7 kg    SpO2 100%    BMI 21.18 kg/m   General: 73 y.o. year-old male well developed well nourished in no acute distress.  Alert and oriented x3.  Cardiovascular: Regular rate and rhythm with no rubs or gallops.  No thyromegaly or JVD noted.    Respiratory: Clear to auscultation with no wheezes or rales. Good inspiratory effort.  Abdomen: Soft nontender nondistended with normal bowel sounds x4 quadrants.  Musculoskeletal: Trace lower extremity edema. 2/4 pulses in all 4 extremities.  Skin: Improving bruising noted in the left groin with no tenderness on palpation.  Psychiatry: Mood is appropriate for condition and setting  Discharge Instructions You were cared for by a hospitalist during your hospital stay. If you have any questions about your discharge medications or the care you received while you were in the hospital after you are discharged, you can call the unit and asked to speak with the hospitalist on call if the hospitalist that took care of you is not available. Once you are discharged, your primary care physician will handle any further medical issues. Please note that NO REFILLS for any discharge medications will be authorized once you are discharged, as it is imperative that you return to your primary care physician (or establish a relationship with a primary care physician if you do not have one) for your aftercare needs so that they can reassess your need for medications and monitor your lab values.   Allergies as of 07/29/2018      Reactions   Codeine Nausea And Vomiting   Sulfa Antibiotics Rash   Sulfonamide Derivatives Rash      Medication List    STOP taking these medications   metoprolol tartrate 100 MG tablet Commonly known as:  LOPRESSOR   pantoprazole 40 MG  tablet Commonly known as:  PROTONIX     TAKE these medications   ALAWAY OP Place 1 drop into the right eye daily.   aspirin 81 MG EC tablet Take 1 tablet (81 mg total) by mouth daily.   calcium carbonate 500 MG chewable tablet Commonly known as:  TUMS - dosed in mg elemental calcium Chew 1 tablet (200 mg of elemental calcium total) by mouth 3 (three) times daily as needed for indigestion or heartburn.   diltiazem 30 MG tablet Commonly known as:  Cardizem Take 1 tablet (30 mg total) by mouth every 6 (six) hours. What changed:    how much to take  how to take this  when to take this  additional instructions   dutasteride 0.5 MG capsule Commonly known as:  AVODART Take 0.5 mg by mouth daily.   Eliquis 5 MG Tabs tablet Generic drug:  apixaban TAKE 1 TABLET BY MOUTH TWICE A DAY What changed:  how much to take   esomeprazole 40 MG capsule Commonly known as:  NexIUM Take 1 capsule (40 mg total) by mouth 2 (two) times daily before a meal.   ezetimibe 10 MG tablet Commonly known as:  ZETIA TAKE 1 TABLET (10 MG TOTAL) BY MOUTH DAILY. What changed:  See the new instructions.   loratadine 10 MG tablet Commonly known as:  CLARITIN Take 10 mg by mouth daily as needed for allergies, rhinitis or itching.   lovastatin 40 MG tablet Commonly known as:  MEVACOR Take 20 mg by mouth at bedtime.   multivitamins ther. w/minerals Tabs tablet Take 1 tablet by mouth daily.   nitroGLYCERIN 0.4 MG/SPRAY spray Commonly known as:  NITROLINGUAL Place 1 spray under the tongue every 5 (five) minutes x 3 doses as needed for chest pain.   Ranexa 1000 MG SR tablet Generic drug:  ranolazine Take 500 mg by mouth 2 (two) times daily.   sucralfate 1 GM/10ML suspension Commonly known as:  CARAFATE Take 10 mLs (1 g total) by mouth 4 (four) times daily -  with meals and at bedtime for 14 days. What changed:    when to take this  reasons to take this   torsemide 20 MG tablet Commonly  known as:  DEMADEX Take 20 mg by mouth as needed (for fluid). FOR SWELLING OR WEIGHT GAIN   vitamin E 400 UNIT capsule Take 400 Units by mouth daily.      Allergies  Allergen Reactions   Codeine Nausea And Vomiting   Sulfa Antibiotics Rash   Sulfonamide Derivatives Rash   Follow-up Information    [facility].   Specialty:  Home Health Services Contact information: [address] [place] [address] [phone]        Irene Shipper, MD Follow up on 09/05/2018.   Specialty:  Gastroenterology Why:  9 AM appt with Dr Henrene Pastor to follow up on swallowing troubles.   Contact information: [address] [address] [phone]        Garwin Brothers, MD. Call in 1 day(s).   Specialty:  Internal Medicine Why:  Please call for a post hospital follow-up appointment. Contact information: [place] [address] [phone]        Constance Haw, MD .   Specialty:  Cardiology Contact information: [address] [place] [address] [phone]        Troy Sine, MD .   Specialty:  Cardiology Contact information: [address] [place] [address] [phone]            The results of significant diagnostics from this hospitalization (including imaging, microbiology, ancillary and laboratory) are listed below for reference.    Significant Diagnostic Studies: Dg Chest Portable 1 View  Result Date: 07/23/2018 CLINICAL DATA:  Weakness, fatigue, lower extremity swelling. EXAM: PORTABLE CHEST 1 VIEW COMPARISON:  CT abdomen pelvis dated Jul 13, 2018. CT chest and chest x-ray dated Jul 12, 2018. FINDINGS: The patient remains rotated to the right. Stable cardiomediastinal silhouette status post CABG. Normal pulmonary vascularity. Small right pleural effusion with mild right basilar atelectasis, unchanged since prior CT abdomen from 07/13/2018. No consolidation or  pneumothorax. No acute osseous abnormality. IMPRESSION: 1. Unchanged small right pleural effusion. Electronically Signed   By: Titus Dubin M.D.   On: 07/23/2018 21:08   Dg Abd Portable 2v  Result Date: 07/13/2018 CLINICAL DATA:  Centralized abdominal pain for the past 5 days. EXAM: PORTABLE ABDOMEN - 2 VIEW COMPARISON:  01/19/2010 FINDINGS: Large stool burden within the cecum and ascending colon. This finding is associated with marked distension of the colon with the cecum measuring approximately  they are. Mild gaseous distention of the upstream small bowel with index loop of small bowel within the mid hemiabdomen measuring approximately 3 cm. No pneumoperitoneum, pneumatosis or portal venous gas. Limited visualization of the lower thorax demonstrates sequela prior median sternotomy. Mild scoliotic curvature of the thoracolumbar spine with associated moderate multilevel lumbar spine DDD, incompletely evaluated. Post right femoral neck dynamic screw fixation, incompletely evaluated. IMPRESSION: Large colonic stool burden with findings most suggestive of adynamic ileus though conceivably a distal colonic obstruction could result in a similar appearance. Clinical correlation is advised. Electronically Signed   By: Sandi Mariscal M.D.   On: 07/13/2018 09:30   Vas [place] Groin Pseudoaneurysm  Result Date: 07/26/2018  ARTERIAL PSEUDOANEURYSM  Exam: Left groin Indications: Patient complains of groin pain. History: S/p catheterization by Dr. Oneida Alar 07/16/18. Performing Technologist: Abram Sander RVS  Examination Guidelines: A complete evaluation includes B-mode imaging, spectral Doppler, color Doppler, and power Doppler as needed of all accessible portions of each vessel. Bilateral testing is considered an integral part of a complete examination. Limited examinations for reoccurring indications may be performed as noted. +-----------+----------+--------+------+----------+  Left Duplex PSV  (cm/s) Waveform Plaque Comment(s)  +-----------+----------+--------+------+----------+  CFA            207     biphasic                    +-----------+----------+--------+------+----------+  Prox SFA        89     biphasic                    +-----------+----------+--------+------+----------+ Left Vein comments:  Findings: An area with well defined borders measuring 4.8 cm x 2.6 cm was visualized with ultrasound characteristics of a pseudoaneurysm. The neck measures approximately 0.4 cm wide and 0.8 cm long.  Diagnosing physician: Monica Martinez MD Electronically signed by Monica Martinez MD on 07/26/2018 at 5:32:18 PM.   --------------------------------------------------------------------------------    Final    Arterial Pseudoaneurysm Compression  Result Date: 07/27/2018  ARTERIAL PSEUDOANEURYSM  Indications: Patient complains of groin pain, bruit and bruising. History: S/p catheterization Visceral angiography on 07/16/18, s/p celiac and SMA stenting, RRA stenting and CABG. Performing Technologist: Oda Cogan RDMS, RVT  Examination Guidelines: A complete evaluation includes B-mode imaging, spectral Doppler, color Doppler, and power Doppler as needed of all accessible portions of each vessel. Bilateral testing is considered an integral part of a complete examination. Limited examinations for reoccurring indications may be performed as noted.  Right Vein comments: +-----------+----------+--------+------+----------+  Left Duplex PSV (cm/s) Waveform Plaque Comment(s)  +-----------+----------+--------+------+----------+  CFA            154                                 +-----------+----------+--------+------+----------+ Left Vein comments: Patent common femoral vein status post compression.  Findings: An area with well defined borders measuring 4.8 cm x 2.6 cm was visualized arising off of the common femoral artery with ultrasound characteristics of a pseudoaneurysm.  Summary: Successful closure of left  groin partially thrombosed pseudoaneurysm with ultrasound guided compression for 15 minutes with Dagoberto Ligas, PA.  The left common femoral, posterior tibial and anterior tibial arteries are patent status post pseudoaneurysm compression. Diagnosing physician: Deitra Mayo MD Electronically signed by Deitra Mayo MD on 07/27/2018 at 3:44:16 PM.   --------------------------------------------------------------------------------    Final    Vas [place] Lower Extremity Arterial Duplex  Result Date: 07/28/2018 LOWER EXTREMITY ARTERIAL DUPLEX STUDY Indications: Pseudoaneurysm compression 07/27/18.  Current ABI: n/a Comparison Study: Prior study done 07/27/18 is available for comparison Performing Technologist: Sharion Dove RVS  Examination Guidelines: A complete evaluation includes B-mode imaging, spectral Doppler, color Doppler, and power Doppler as needed of all accessible portions of each vessel. Bilateral testing is considered an integral part of a complete examination. Limited examinations for reoccurring indications may be performed as noted.  Summary: Right: Psuedonaneurysm remains closed post compression 07/27/18.  Electronically signed by Deitra Mayo MD on 07/28/2018 at 12:07:51 PM.    Final    Ct Angio Abd/pel W/ And/or W/o  Result Date: 07/13/2018 : Alerts CLINICAL DATA:  Chest pain, shortness of breath, abdominal pain x2 days, weight loss EXAM: CTA ABDOMEN AND PELVIS WITH CONTRAST TECHNIQUE: Multidetector CT imaging of the abdomen and pelvis was performed using the standard protocol during bolus administration of intravenous contrast. Multiplanar reconstructed images and MIPs were obtained and reviewed to evaluate the vascular anatomy. CONTRAST:  113mL OMNIPAQUE IOHEXOL 350 MG/ML SOLN COMPARISON:  07/12/2018, 08/07/2009 FINDINGS: VASCULAR Coronary calcifications. Aorta: Tortuous. Moderate scattered calcified atheromatous plaque. Minimal eccentric nonocclusive mural thrombus in the  suprarenal segment. No aneurysm, dissection, or stenosis. Celiac: Short-segment origin stenosis of probable hemodynamic significance, ectatic distally. SMA: Partially calcified ostial plaque/thrombus extending over length of approximately 1.7 cm, resulting in short segment relatively high-grade stenosis. Distally there is a tandem lesion of eccentric partially calcified plaque without high-grade stenosis. Classic distal branch anatomy. Renals: Patent right renal ostial stent with a degree of in stent restenosis of indeterminate hemodynamic severity, patent distally. Duplicated left renal arteries, superior dominant and widely patent. There is calcified plaque at the origin of the more diminutive inferior left renal artery resulting in stenosis of indeterminate hemodynamic significance. IMA: Short-segment origin occlusion or high-grade stenosis, patent distally reconstituted by visceral collaterals. Inflow: Scattered calcified plaque throughout the iliac arterial systems. Linear intraluminal filling defect in the distal right external iliac artery possibly short dissection flap of indeterminate hemodynamic significance. Origin stenosis of the right internal iliac artery with fusiform dilatation poststenotic dilatation up to 1.3 cm diameter. Proximal Outflow: Atheromatous common femoral arteries. Origin occlusion of bilateral SFA. Deep femoral branches patent. Veins: Patent hepatic veins, portal vein, SMV, splenic vein, bilateral renal veins, IVC and iliac venous system. No venous pathology identified. Review of the MIP images confirms the above findings. NON-VASCULAR Lower chest: Small pleural effusions right greater than left. Atelectasis/consolidation in the lung bases right greater than left. Previous median sternotomy. Hepatobiliary: Periportal edema. Gallbladder physiologically distended. No discrete liver lesion. No biliary ductal dilatation. Pancreas: Unremarkable. No pancreatic ductal dilatation or  surrounding inflammatory changes. Spleen: Normal in size without focal abnormality. Adrenals/Urinary Tract: Normal adrenals. Unremarkable kidneys. No hydronephrosis. Urinary bladder incompletely distended, moderately thick-walled. Stomach/Bowel: Stomach is nondistended. Small bowel is nondilated. Moderate colonic fecal material without dilatation. There is circumferential wall thickening suspected in the descending and sigmoid colon with incomplete luminal distention. No definite adjacent inflammatory/edematous change. Lymphatic: No abdominal or pelvic adenopathy. Reproductive: Prostate is unremarkable. Other: Trace pelvic and perihepatic ascites. No free air. Musculoskeletal: Spondylitic changes throughout the lumbar spine. Sliding screw and IM rod fixation across a comminuted right femoral intertrochanteric fracture. No acute fracture or worrisome bone lesion. IMPRESSION: VASCULAR 1. Significant origin stenoses of celiac axis, SMA, and IMA; resultant increased risk of occlusive mesenteric ischemia. 2. Short-segment dissection flap in the distal right external iliac artery, of indeterminate hemodynamic significance. Correlate with clinical symptomatology and  ABIs. 3. Patent right renal artery ostial stent with a degree of in-stent stenosis of indeterminate hemodynamic significance. 4. Stenosis of the inferior left renal artery of indeterminate hemodynamic significance. 5. Proximal stenosis of the right internal iliac artery, with 1.3 cm poststenotic dilatation. NON-VASCULAR:. 1. Circumferential wall thickening in descending and sigmoid colon consistent with colitis, possibly ischemic given the vascular findings. 2. Small bilateral pleural effusions with bibasilar atelectasis/consolidation. 3. Small amount of abdominal ascites. 4. Thick-walled urinary bladder suggesting a degree of bladder outlet obstruction. 5. Postop and degenerative changes as above. Electronically Signed   By: Lucrezia Europe M.D.   On: 07/13/2018  17:09    Microbiology: Recent Results (from the past 240 hour(s))  SARS Coronavirus 2 (CEPHEID - Performed in [facility] hospital lab), Hosp Order     Status: None   Collection Time: 07/23/18  8:45 PM  Result Value Ref Range Status   SARS Coronavirus 2 NEGATIVE NEGATIVE Final    Comment: (NOTE) If result is NEGATIVE SARS-CoV-2 target nucleic acids are NOT DETECTED. The SARS-CoV-2 RNA is generally detectable in upper and lower  respiratory specimens during the acute phase of infection. The lowest  concentration of SARS-CoV-2 viral copies this assay can detect is 250  copies / mL. A negative result does not preclude SARS-CoV-2 infection  and should not be used as the sole basis for treatment or other  patient management decisions.  A negative result may occur with  improper specimen collection / handling, submission of specimen other  than nasopharyngeal swab, presence of viral mutation(s) within the  areas targeted by this assay, and inadequate number of viral copies  (<250 copies / mL). A negative result must be combined with clinical  observations, patient history, and epidemiological information. If result is POSITIVE SARS-CoV-2 target nucleic acids are DETECTED. The SARS-CoV-2 RNA is generally detectable in upper and lower  respiratory specimens dur ing the acute phase of infection.  Positive  results are indicative of active infection with SARS-CoV-2.  Clinical  correlation with patient history and other diagnostic information is  necessary to determine patient infection status.  Positive results do  not rule out bacterial infection or co-infection with other viruses. If result is PRESUMPTIVE POSTIVE SARS-CoV-2 nucleic acids MAY BE PRESENT.   A presumptive positive result was obtained on the submitted specimen  and confirmed on repeat testing.  While 2019 novel coronavirus  (SARS-CoV-2) nucleic acids may be present in the submitted sample  additional confirmatory testing may  be necessary for epidemiological  and / or clinical management purposes  to differentiate between  SARS-CoV-2 and other Sarbecovirus currently known to infect humans.  If clinically indicated additional testing with an alternate test  methodology [phone]) is advised. The SARS-CoV-2 RNA is generally  detectable in upper and lower respiratory sp ecimens during the acute  phase of infection. The expected result is Negative. Fact Sheet for Patients:  StrictlyIdeas.no Fact Sheet for Healthcare Providers: BankingDealers.co.za This test is not yet approved or cleared by the [place] FDA and has been authorized for detection and/or diagnosis of SARS-CoV-2 by FDA under an Emergency Use Authorization (EUA).  This EUA will remain in effect (meaning this test can be used) for the duration of the COVID-19 declaration under Section 564(b)(1) of the Act, 21 U.S.C. section 360bbb-3(b)(1), unless the authorization is terminated or revoked sooner. Performed at [facility], [place] [address], [place], [place] [address]   Urine culture     Status: None   Collection  Time: 07/24/18  1:08 AM  Result Value Ref Range Status   Specimen Description URINE, CATHETERIZED  Final   Special Requests STERILE CONTAINER  Final   Culture   Final    NO GROWTH Performed at [facility], [address], [place], [place] [address]    Report Status 07/25/2018 FINAL  Final     Labs: Basic Metabolic Panel: Recent Labs  Lab 07/23/18 2045 07/24/18 0723 07/25/18 0505 07/26/18 0427 07/27/18 0430  NA 131* 129* 132* 134* 134*  K 3.7 4.3 3.9 3.8 3.9  CL 98 97* 101 102 105  CO2 23 23 25 24 22   GLUCOSE 116* 97 97 100* 114*  BUN 20 20 14 11 13   CREATININE 1.05 1.15 1.11 1.06 0.96  CALCIUM 8.4* 8.4* 8.2* 8.4* 8.3*  MG 1.8  --  1.9 1.9 1.8   Liver Function Tests: Recent Labs  Lab 07/23/18 2045 07/26/18 0427 07/27/18 0430  AST 15 16 17   ALT 12 10 11    ALKPHOS 82 84 93  BILITOT 1.2 1.0 1.1  PROT 5.3* 5.3* 5.1*  ALBUMIN 2.7* 2.8* 2.8*   No results for input(s): LIPASE, AMYLASE in the last 168 hours. No results for input(s): AMMONIA in the last 168 hours. CBC: Recent Labs  Lab 07/23/18 2045 07/24/18 0723 07/25/18 0505 07/26/18 0427 07/27/18 0430 07/28/18 0625 07/29/18 0722  WBC 9.5 8.4 5.5 5.1 4.8  --   --   NEUTROABS 8.3*  --   --   --   --   --   --   HGB 8.3* 8.3* 7.5* 8.0* 7.4* 7.6* 8.0*  HCT 25.5* 24.9* 22.5* 23.6* 22.3* 22.9* 24.4*  MCV 99.2 96.9 97.8 97.9 97.0  --   --   PLT 223 216 190 191 153  --   --    Cardiac Enzymes: Recent Labs  Lab 07/23/18 2045 07/24/18 0219 07/24/18 0723  TROPONINI 0.04* 0.04* 0.04*   BNP: BNP (last 3 results) Recent Labs    07/23/18 2026  BNP 1,122.4*    ProBNP (last 3 results) No results for input(s): PROBNP in the last 8760 hours.  CBG: Recent Labs  Lab 07/26/18 2102 07/27/18 0607  GLUCAP 127* 106*       Signed:  Kayleen Memos, MD [facility] 07/29/2018, 10:39 AM

## 2018-07-29 NOTE — Progress Notes (Signed)
   VASCULAR SURGERY ASSESSMENT & PLAN:   S/P SUCCESSFUL COMPRESSION OF LEFT FEMORAL FALSE ANEURYSM: Follow-up duplex scan yesterday showed that the aneurysm was still successfully closed.  He can be discharged from our standpoint.  SUBJECTIVE:   Resting comfortably.  PHYSICAL EXAM:   Vitals:   07/28/18 1207 07/28/18 2042 07/28/18 2257 07/29/18 0510  BP: 119/60 117/63 (!) 127/55 118/81  Pulse: 94 (!) 102 80 91  Resp: 18 20  20   Temp: 98.4 F (36.9 C) 98.9 F (37.2 C)  98 F (36.7 C)  TempSrc: Oral Oral  Oral  SpO2: 100% 100%  100%  Weight:    57.7 kg  Height:       No significant swelling left groin.  LABS:   Lab Results  Component Value Date   WBC 4.8 07/27/2018   HGB 7.6 (L) 07/28/2018   HCT 22.9 (L) 07/28/2018   MCV 97.0 07/27/2018   PLT 153 07/27/2018   Lab Results  Component Value Date   CREATININE 0.96 07/27/2018   Lab Results  Component Value Date   INR 1.08 01/24/2011   CBG (last 3)  Recent Labs    07/26/18 2102 07/27/18 0607  GLUCAP 127* 106*    PROBLEM LIST:    Active Problems:   Essential hypertension   Hx of CABG '84, '96, last PCI 2012   Ulcer of esophagus without bleeding   Anemia   PVD (peripheral vascular disease) (HCC)   Hyperlipidemia LDL goal <70   PAF (paroxysmal atrial fibrillation) (HCC)   Coronary artery disease   Diastolic CHF (HCC)   Chronic mesenteric ischemia (HCC)   Nausea and vomiting   Dysphagia   Malnutrition of moderate degree   CURRENT MEDS:   . apixaban  5 mg Oral BID  . aspirin EC  81 mg Oral Daily  . diltiazem  30 mg Oral Q6H  . dutasteride  0.5 mg Oral Daily  . ezetimibe  10 mg Oral Daily  . feeding supplement (ENSURE ENLIVE)  237 mL Oral TID BM  . ketotifen  1 drop Right Eye Daily  . multivitamin with minerals  1 tablet Oral Daily  . pantoprazole  40 mg Oral BID  . pravastatin  20 mg Oral q1800  . ranolazine  500 mg Oral BID  . sodium chloride flush  3 mL Intravenous Q12H  . sucralfate  1 g Oral  TID WC & HS    Deitra Mayo Beeper: [phone] Office: [phone] 07/29/2018

## 2018-07-29 NOTE — Progress Notes (Signed)
Physical Therapy Treatment Patient Details Name: [name] MRN: [id number] DOB: [birth year] Today's Date: 07/29/2018    History of Present Illness 73 yo male admitted with N/V edema pt with CHf and possible esophageal stenosis. PMhx: Rt hip fx with ORIF april 2020, CAD, CABG, AFib, mesenteric ischemia, PVD, Rt CEA, CKD, NSTEMI May 2020    PT Comments    Pt very pleasant, moving well without pain in LLE today. Pt with increased gait tolerance and posture with ability to perform stairs and increased HEP today. Pt educated for progression and stair handout provided. D/C plan remains appropriate.     Follow Up Recommendations  Home health PT;Supervision for mobility/OOB     Equipment Recommendations  None recommended by PT    Recommendations for Other Services       Precautions / Restrictions Precautions Precautions: Fall Restrictions Weight Bearing Restrictions: No RLE Weight Bearing: Weight bearing as tolerated    Mobility  Bed Mobility Overal bed mobility: Modified Independent Bed Mobility: Supine to Sit;Sit to Supine       Sit to supine: Modified independent (Device/Increase time)   General bed mobility comments: increased time to get RLE off of bed and back on but able to perform without assist. sleeps in recliner at home  Transfers Overall transfer level: Modified independent                  Ambulation/Gait Ambulation/Gait assistance: Supervision Gait Distance (Feet): 300 Feet Assistive device: Rolling walker (2 wheeled) Gait Pattern/deviations: Step-through pattern;Decreased stride length;Trunk flexed   Gait velocity interpretation: >2.62 ft/sec, indicative of community ambulatory General Gait Details: pt with improved posture today, no pain in left leg and increased stride length. Cues for posture and safety   Stairs Stairs: Yes Stairs assistance: Min assist Stair Management: Step to pattern;Backwards;With walker Number of Stairs: 2 General  stair comments: pt educated for stairs with RW with assist to perform and handout provided   Wheelchair Mobility    Modified Rankin (Stroke Patients Only)       Balance Overall balance assessment: Mild deficits observed, not formally tested                                          Cognition Arousal/Alertness: Awake/alert Behavior During Therapy: WFL for tasks assessed/performed Overall Cognitive Status: Within Functional Limits for tasks assessed                                        Exercises General Exercises - Lower Extremity Long Arc Quad: Seated;AROM;Both;20 reps Hip ABduction/ADduction: AAROM;Right;15 reps;Seated Hip Flexion/Marching: Seated;AROM;Both;20 reps    General Comments        Pertinent Vitals/Pain Faces Pain Scale: Hurts little more Pain Location: right hip Pain Descriptors / Indicators: Sore Pain Intervention(s): Limited activity within patient's tolerance    Home Living                      Prior Function            PT Goals (current goals can now be found in the care plan section) Progress towards PT goals: Progressing toward goals    Frequency           PT Plan Current plan remains appropriate    Co-evaluation  AM-PAC PT "6 Clicks" Mobility   Outcome Measure  Help needed turning from your back to your side while in a flat bed without using bedrails?: A Little Help needed moving from lying on your back to sitting on the side of a flat bed without using bedrails?: A Little Help needed moving to and from a bed to a chair (including a wheelchair)?: A Little Help needed standing up from a chair using your arms (e.g., wheelchair or bedside chair)?: A Little Help needed to walk in hospital room?: A Little Help needed climbing 3-5 steps with a railing? : A Little 6 Click Score: 18    End of Session Equipment Utilized During Treatment: Gait belt Activity Tolerance: Patient  tolerated treatment well Patient left: with call bell/phone within reach;in bed Nurse Communication: Mobility status PT Visit Diagnosis: Other abnormalities of gait and mobility (R26.89);Difficulty in walking, not elsewhere classified (R26.2);Muscle weakness (generalized) (M62.81)     Time: 5320-2334 PT Time Calculation (min) (ACUTE ONLY): 17 min  Charges:  $Gait Training: 8-22 mins                     [place], PT Acute Rehabilitation Services Pager: [phone] Office: [place] 07/29/2018, 11:56 AM

## 2018-07-31 ENCOUNTER — Telehealth: Payer: Self-pay | Admitting: Physician Assistant

## 2018-07-31 NOTE — Telephone Encounter (Signed)
Home phone/ consent/ my chart/ pre reg completed °

## 2018-08-01 ENCOUNTER — Encounter: Payer: Self-pay | Admitting: Gastroenterology

## 2018-08-02 ENCOUNTER — Telehealth (INDEPENDENT_AMBULATORY_CARE_PROVIDER_SITE_OTHER): Payer: Medicare Other | Admitting: Physician Assistant

## 2018-08-02 ENCOUNTER — Encounter: Payer: Self-pay | Admitting: Physician Assistant

## 2018-08-02 ENCOUNTER — Other Ambulatory Visit: Payer: Self-pay | Admitting: Physician Assistant

## 2018-08-02 VITALS — BP 107/60 | HR 87 | Ht 65.0 in | Wt 125.5 lb

## 2018-08-02 DIAGNOSIS — I739 Peripheral vascular disease, unspecified: Secondary | ICD-10-CM

## 2018-08-02 DIAGNOSIS — I4819 Other persistent atrial fibrillation: Secondary | ICD-10-CM

## 2018-08-02 DIAGNOSIS — I10 Essential (primary) hypertension: Secondary | ICD-10-CM

## 2018-08-02 DIAGNOSIS — E785 Hyperlipidemia, unspecified: Secondary | ICD-10-CM

## 2018-08-02 DIAGNOSIS — I25810 Atherosclerosis of coronary artery bypass graft(s) without angina pectoris: Secondary | ICD-10-CM | POA: Diagnosis not present

## 2018-08-02 DIAGNOSIS — K2210 Ulcer of esophagus without bleeding: Secondary | ICD-10-CM

## 2018-08-02 DIAGNOSIS — D649 Anemia, unspecified: Secondary | ICD-10-CM

## 2018-08-02 DIAGNOSIS — I6523 Occlusion and stenosis of bilateral carotid arteries: Secondary | ICD-10-CM

## 2018-08-02 DIAGNOSIS — K551 Chronic vascular disorders of intestine: Secondary | ICD-10-CM

## 2018-08-02 MED ORDER — DILTIAZEM HCL 30 MG PO TABS: 30.0000 mg | ORAL_TABLET | ORAL | 0 refills | Status: DC | PRN

## 2018-08-02 MED ORDER — DILTIAZEM HCL ER COATED BEADS 120 MG PO CP24: 120.0000 mg | ORAL_CAPSULE | Freq: Every day | ORAL | 6 refills | Status: DC

## 2018-08-02 NOTE — Progress Notes (Signed)
Virtual Visit via Telephone Note   This visit type was conducted due to national recommendations for restrictions regarding the COVID-19 Pandemic (e.g. social distancing) in an effort to limit this patient's exposure and mitigate transmission in our community.  Due to his co-morbid illnesses, this patient is at least at moderate risk for complications without adequate follow up.  This format is felt to be most appropriate for this patient at this time.  The patient did not have access to video technology/had technical difficulties with video requiring transitioning to audio format only (telephone).  All issues noted in this document were discussed and addressed.  No physical exam could be performed with this format.  Please refer to the patient's chart for his  consent to telehealth for [facility].   Date:  08/02/2018   ID:  DANDRA VELARDI, DOB 1945-11-04, MRN [id number]  Patient Location: Home Provider Location: Home  PCP:  Garwin Brothers, MD  Cardiologist:  Shelva Majestic, MD  Electrophysiologist:  Constance Haw, MD   Evaluation Performed:  Follow-Up Visit  Chief Complaint:  Hospital followup  History of Present Illness:    [name] is a 73 y.o. male with PMH of CAD s/p CABG in [place] and redo CABG in 1996, PAD with bilateral SFA occlusion, carotid artery disease s/p right CEA 2005, right bundle branch block, renal artery stenosis s/p right renal artery stenting and repeat intervention, subclavian artery stenosis, CKD stage II-III, hypertension, hyperlipidemia, and persistent atrial fibrillation.  In 1998, he underwent stenting of his left main artery and stenting of SVG to OM.  He had stenting of SVG to marginal vessel in 2012.  Myoview in May 2015 showed mild inferobasal ischemia likely in the RCA distribution.  He was started on Eliquis in May 2018 after discontinuing the Plavix.  Repeat echocardiogram in June 2018 showed EF 55 to 60%, PA peak pressure elevated at 48 mmHg,  moderate LVH.  Due to recurrent atrial fibrillation, he was referred to Dr. Curt Bears in January 2019.  Due to QTC prolongation, he was not felt to be a candidate for Tikosyn or sotalol and also felt to be high risk for ablation.  As result, he was recommended to reinitiate amiodarone therapy in February 2019 and he subsequently underwent successful cardioversion.  Most recent carotid Doppler obtained in February 2020 showed 40 to 59% bilateral ICA disease, right subclavian artery was stenotic, well left subclavian artery flow was disturbed.  More recently, patient was admitted on 07/12/2018 with 2-day onset of progressive abdominal pain and generalized weakness.  He also developed a fever as well.  COVID-19 test was negative.  He was [facility] from [facility].  Troponin level was elevated at 2.53.  D-dimer also elevated at 4501 with normal being less than 500.  Cardiology was consulted and felt the troponin elevation was due to demand ischemia as he specifically denied any recent chest pain.  Troponin eventually peaked at 5.16 before trending back down.  Work-up did not demonstrate any PE however did notice SMA stenosis.  Vascular surgery was consulted for mesenteric ischemia.  He eventually underwent selective celiac and superior mesenteric angiogram and stenting of the celiac artery and the superior mesenteric artery by Dr. Oneida Alar.  He returned to the hospital on 5/18 due to significant heartburn, nausea, vomiting and dysphasia.  He was evaluated by GI with endoscopy on 5/20 revealing nonbleeding esophageal ulcer, benign-appearing esophageal stenosis, gastric stenosis at the pylorus.  Esophageal stricture  was not dilated due to presence of ulcer per GI.  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  Vascular surgery was consulted and treated pseudoaneurysm with compression x2.  Follow-up duplex showed aneurysm was successfully closed.  Eliquis  restarted on 5/23.  Patient was contacted today via telephone visit.  Despite his recent complicated hospitalization, he is actually doing quite well since discharge.  He denies significant abdominal pain.  He has not seen by a physician since discharge.  I plan to obtain a CBC to check his hemoglobin level.  Otherwise he denies any recent chest discomfort or significant shortness of breath.  He is still on a short acting diltiazem, I will switch this to oral long-acting 120 mg diltiazem CD.  He may still use the short acting diltiazem on a as needed basis for any breakthrough palpitation.  Otherwise he denies any significant lower extremity edema, orthopnea or PND.  The patient does not have symptoms concerning for COVID-19 infection (fever, chills, cough, or new shortness of breath).    Past Medical History:  Diagnosis Date   Anemia    Angina    Atrial fibrillation ([place])    Blood transfusion    Bronchitis    Bulging discs    "8 of them; thoracic, lumbar, sacral area"   Chronic back pain    Colon polyps    Complication of anesthesia    Coronary artery disease 01/24/11   Successful PCI long segmental stensois  vein graft to the CX marginal vessel-graft previously stented prox. 3.0x16mm TAXUS stent mid seg 3.0x58mm TAXUS stent now tandem stens of 3.0x85mm & 3.0x36mm placed with the seg. 50,80 & 99% stenosis reduced to 0%   DDD (degenerative disc disease)    Diastolic heart failure ([place])    Dysrhythmia    "PAC's and PVC's"   Esophageal dilatation    "have had it done 7 times; last time ~ 2001"   Esophageal stricture    GERD (gastroesophageal reflux disease)    Headache(784.0)    Hiatal hernia    Hyperlipidemia    Myocardial infarction [facility]) 1991   Myocardial infarction ([place]) 2005   "had 3 heart attacks this year"   Paroxysmal atrial fibrillation ([place])    Pyloric stenosis    RBBB    Renal artery stenosis (H[facility])    hhistory of right renal artery stenting and  re-intervention for "in-stent restenosis.   S/P CABG (coronary artery bypass graft) [place] of breath    "when I had the heart attacks"   Subclavian artery stenosis, left ([place])    Ulcer    "small; Dr. Henrene Pastor found it 02/2010"   Past Surgical History:  Procedure Laterality Date   ADENOIDECTOMY     "when I was an infant"   [place]  07/26/2018   Procedure: BIOPSY;  Surgeon: Thornton Park, MD;  Location: [place];  Service: Gastroenterology;;   CARDIOVERSION N/A 04/17/2017   Procedure: CARDIOVERSION;  Surgeon: Jerline Pain, MD;  Location: [place];  Service: Cardiovascular;  Laterality: N/A;   CAROTID ENDARTERECTOMY Right [place]  01/24/11   "today makes a total of 9 stents"   CORONARY ARTERY BYPASS GRAFT  1984   CABG X 4   CORONARY ARTERY BYPASS GRAFT  1996   CABG X 4   ESOPHAGOGASTRODUODENOSCOPY (EGD) WITH PROPOFOL N/A 07/26/2018  Procedure: ESOPHAGOGASTRODUODENOSCOPY (EGD) WITH PROPOFOL;  Surgeon: Thornton Park, MD;  Location: [place];  Service: Gastroenterology;  Laterality: N/A;   LEFT HEART CATHETERIZATION WITH CORONARY/GRAFT ANGIOGRAM N/A 01/24/2011   Procedure: LEFT HEART CATHETERIZATION WITH Beatrix Fetters;  Surgeon: Troy Sine, MD;  Location: [facility] CATH LAB;  Service: Cardiovascular;  Laterality: N/A;   PERIPHERAL VASCULAR INTERVENTION  07/16/2018   Procedure: PERIPHERAL VASCULAR INTERVENTION;  Surgeon: Elam Dutch, MD;  Location: [place] CV LAB;  Service: Cardiovascular;;  SMA and Celiac   pylondial cyst removal     RETINAL DETACHMENT SURGERY  04/2007   right   TONSILLECTOMY  1972   VISCERAL ANGIOGRAPHY N/A 07/16/2018   Procedure: VISCERAL ANGIOGRAPHY;  Surgeon: Elam Dutch, MD;  Location: [place] CV LAB;  Service: Cardiovascular;  Laterality: N/A;     Current Meds  Medication Sig    aspirin EC 81 MG EC tablet Take 1 tablet (81 mg total) by mouth daily.   calcium carbonate (TUMS - DOSED IN MG ELEMENTAL CALCIUM) 500 MG chewable tablet Chew 1 tablet (200 mg of elemental calcium total) by mouth 3 (three) times daily as needed for indigestion or heartburn.   diltiazem (CARDIZEM) 30 MG tablet Take 1 tablet (30 mg total) by mouth every 6 (six) hours.   dutasteride (AVODART) 0.5 MG capsule Take 0.5 mg by mouth daily.    ELIQUIS 5 MG TABS tablet TAKE 1 TABLET BY MOUTH TWICE A DAY (Patient taking differently: Take 5 mg by mouth 2 (two) times daily. )   esomeprazole (NEXIUM) 40 MG capsule Take 1 capsule (40 mg total) by mouth 2 (two) times daily before a meal.   ezetimibe (ZETIA) 10 MG tablet TAKE 1 TABLET (10 MG TOTAL) BY MOUTH DAILY. (Patient taking differently: Take 10 mg by mouth daily. )   Ketotifen Fumarate (ALAWAY OP) Place 1 drop into the right eye daily.   loratadine (CLARITIN) 10 MG tablet Take 10 mg by mouth daily as needed for allergies, rhinitis or itching.    lovastatin (MEVACOR) 40 MG tablet Take 20 mg by mouth at bedtime.   Multiple Vitamins-Minerals (MULTIVITAMINS THER. W/MINERALS) TABS Take 1 tablet by mouth daily.     nitroGLYCERIN (NITROLINGUAL) 0.4 MG/SPRAY spray Place 1 spray under the tongue every 5 (five) minutes x 3 doses as needed for chest pain.   ranolazine (RANEXA) 1000 MG SR tablet Take 500 mg by mouth 2 (two) times daily.   sucralfate (CARAFATE) 1 GM/10ML suspension Take 10 mLs (1 g total) by mouth 4 (four) times daily -  with meals and at bedtime for 14 days.   torsemide (DEMADEX) 20 MG tablet Take 20 mg by mouth as needed (for fluid). FOR SWELLING OR WEIGHT GAIN   vitamin E 400 UNIT capsule Take 400 Units by mouth daily.      Allergies:   Codeine; Sulfa antibiotics; and Sulfonamide derivatives   Social History   Tobacco Use   Smoking status: Former Smoker    Packs/day: 1.00    Years: 38.00    Pack years: 38.00    Types: Cigarettes     Last attempt to quit: 02/09/2005    Years since quitting: 13.4   Smokeless tobacco: Never Used   Tobacco comment: "quit smoking cigarrettes 2006"  Substance Use Topics   Alcohol use: No    Alcohol/week: 0.0 standard drinks   Drug use: No     Family Hx: The patient's family history includes AAA (abdominal aortic aneurysm) in his father; Breast  cancer in his paternal aunt; Diabetes in his sister; Heart attack in his father; Heart disease in his father; Heart failure in his father; Hypertension in his mother; Stroke in his mother; Ulcers in his father. There is no history of Colon cancer.  ROS:   Please see the history of present illness.     All other systems reviewed and are negative.   Prior CV studies:   The following studies were reviewed today:  Echo 08/24/2016 LV EF: 55% -   60% Study Conclusions  - Left ventricle: The cavity size was normal. Wall thickness was   increased in a pattern of moderate LVH. Systolic function was   normal. The estimated ejection fraction was in the range of 55%   to 60%. Wall motion was normal; there were no regional wall   motion abnormalities. The study is not technically sufficient to   allow evaluation of LV diastolic function. - Mitral valve: Calcified annulus. Mildly thickened leaflets .   There was mild regurgitation. - Left atrium: The atrium was normal in size. - Right atrium: The atrium was mildly dilated. - Tricuspid valve: There was mild regurgitation. - Pulmonary arteries: PA peak pressure: 48 mm Hg (S). - Inferior vena cava: The vessel was dilated. The respirophasic   diameter changes were blunted (< 50%), consistent with elevated   central venous pressure.  Impressions:  - Compared to a prior study in 2016, the LVEF is stable at 55-60%.   Labs/Other Tests and Data Reviewed:    EKG:  An ECG dated 07/23/2018 was personally reviewed today and demonstrated:  Atrial fibrillation with right bundle branch block  Recent  Labs: 07/23/2018: B Natriuretic Peptide 1,122.4 07/24/2018: TSH 0.857 07/27/2018: ALT 11; BUN 13; Creatinine, Ser 0.96; Magnesium 1.8; Platelets 153; Potassium 3.9; Sodium 134 07/29/2018: Hemoglobin 8.0   Recent Lipid Panel Lab Results  Component Value Date/Time   CHOL 143 05/25/2018 10:27 AM   TRIG 84 05/25/2018 10:27 AM   HDL 63 05/25/2018 10:27 AM   CHOLHDL 2.3 05/25/2018 10:27 AM   CHOLHDL 2.6 08/01/2014 08:52 AM   LDLCALC 63 05/25/2018 10:27 AM    Wt Readings from Last 3 Encounters:  08/02/18 125 lb 8 oz (56.9 kg)  07/29/18 127 lb 4.8 oz (57.7 kg)  07/21/18 134 lb 11.2 oz (61.1 kg)     Objective:    Vital Signs:  BP 107/60    Pulse 87    Ht 5\' 5"  (1.651 m)    Wt 125 lb 8 oz (56.9 kg)    BMI 20.88 kg/m    VITAL SIGNS:  reviewed  ASSESSMENT & PLAN:    1. CAD s/p CABG: Patient had elevated troponin during the recent hospitalization, however this is likely demand ischemia.  He does not have any chest pain.  Continue aspirin.  2. Persistent atrial fibrillation: On Eliquis.  Rate controlled on 30 mg every 6 hours of diltiazem.  I will consolidate this to long-acting diltiazem as patient seems to have some breakthrough tachycardia episodes in between 2 doses of medication.  We can potentially uptitrate the long-acting diltiazem further if his blood pressure is able to tolerate.  His blood pressure is borderline today.  3. SMA stenosis: Recently underwent stenting by vascular surgery  4. Carotid artery disease: Followed by vascular surgery.  5. PAD: History of bilateral SFA occlusion, no claudication symptoms  6. Hypertension: Blood pressure borderline current therapy.  We will consolidate short acting diltiazem to long-acting diltiazem at this time  7. Hyperlipidemia: On Zetia and lovastatin   COVID-19 Education: The signs and symptoms of COVID-19 were discussed with the patient and how to seek care for testing (follow up with PCP or arrange E-visit).  The importance of  social distancing was discussed today.  Time:   Today, I have spent 19 minutes with the patient with telehealth technology discussing the above problems.     Medication Adjustments/Labs and Tests Ordered: Current medicines are reviewed at length with the patient today.  Concerns regarding medicines are outlined above.   Tests Ordered: No orders of the defined types were placed in this encounter.   Medication Changes: No orders of the defined types were placed in this encounter.   Disposition:  Follow up in 3 week(s)  Signed, Almyra Deforest, PA  08/02/2018 8:54 AM    [facility]

## 2018-08-02 NOTE — Patient Instructions (Signed)
Medication Instructions:  Change diltiazem 30 mg to as needed for heart rate greater than 120  START Cardizem CD (24hr Diltiazem) 120 mg daily  If you need a refill on your cardiac medications before your next appointment, please call your pharmacy.   Lab work: Your physician recommends that you have lab work done at [facility] in [place] within the next 2 weeks: CBC  If you have labs (blood work) drawn today and your tests are completely normal, you will receive your results only by: Marland Kitchen MyChart Message (if you have MyChart) OR . A paper copy in the mail If you have any lab test that is abnormal or we need to change your treatment, we will call you to review the results.  Follow-Up: At [facility], you and your health needs are our priority.  As part of our continuing mission to provide you with exceptional heart care, we have created designated Provider Care Teams.  These Care Teams include your primary Cardiologist (physician) and Advanced Practice Providers (APPs -  Physician Assistants and Nurse Practitioners) who all work together to provide you with the care you need, when you need it. . You have been scheduled for an IN OFFICE follow-up visit with Fabian Sharp, PA on 08/21/18 at 10:45 AM. Dr. Claiborne Billings will be in the office the same day.   Any Other Special Instructions Will Be Listed Below (If Applicable). None

## 2018-08-06 ENCOUNTER — Telehealth: Payer: Self-pay | Admitting: Cardiovascular Disease

## 2018-08-06 NOTE — Telephone Encounter (Signed)
Doug from [facility] called about patient's wt gained of 2 1/2 lbs since yesterday, and 5 1/2 lbs since Thursday 5/28.  Just wanted to make you aware.

## 2018-08-06 NOTE — Telephone Encounter (Signed)
Call pt in regards to message left by PT/Doug with [facility]. He was calling to report the pt has had 5 1/2 lb weight gain over 4 days. I spoke with the pt and he reports having edema up to his knees for several days. He has been struggling with edema since his hip surgery 06/2018. He denies shortness of breath, CP..he says he feels well except his legs are uncomfortable. No redness or heat or pain.   Pt has Demadex 20 mg for PRN use. His last one was on Saturday 08/04/18. He is usually reluctant to take it with a h/o dehydration. However, he has been hydrating well.   I have advised him since it is after 5 pm to go ahead and take the PRN Demadex and I will forward the message to Dr. Claiborne Billings for review.

## 2018-08-06 NOTE — Telephone Encounter (Signed)
agree

## 2018-08-07 NOTE — Telephone Encounter (Signed)
Called patient- advised that Dr.Kelly agreed with this plan, and to call if is any issues. Patient did state after taking the medication he has dropped 2-3 lbs already.

## 2018-08-13 ENCOUNTER — Telehealth: Payer: Self-pay | Admitting: Cardiovascular Disease

## 2018-08-13 DIAGNOSIS — I10 Essential (primary) hypertension: Secondary | ICD-10-CM

## 2018-08-13 NOTE — Telephone Encounter (Signed)
Can you please advise- Isaac Laud was the last to see patient, but I see he is out; Dr.Kelly is also out this week in the hospital.  Thank you!

## 2018-08-13 NOTE — Telephone Encounter (Signed)
Pt c/o swelling: STAT is pt has developed SOB within 24 hours  1) How much weight have you gained and in what time span? 9 lbs since last week  2) If swelling, where is the swelling located? Both legs from from his toes to above his knee  3) Are you currently taking a fluid pill? yes  4) Are you currently SOB? yes  5) Do you have a log of your daily weights (if so, list)? yes  6) Have you gained 3 pounds in a day or 5 pounds in a week?  9 lbs in a week  7) Have you traveled recently? no

## 2018-08-13 NOTE — Telephone Encounter (Signed)
Patient was advised of message- ordered lab work, he is going to see if his home health nurse can draw it and send it as he is unable to go out anywhere due to a broken hip.   Patient has appointment with Doreene Adas, PA on 16th- same day as Dr.Kelly DOD, NP advised that was fine to do.   Patient had no questions or concerns at this time.

## 2018-08-13 NOTE — Telephone Encounter (Signed)
Pt states he has gained 11 pounds since 5/25 and provided the following weight log:  5/25: 125 5/26: 127 5/27: 125.5 5/28: 126.5 5/31: 129.5 6/1: 132 6/2: 129.5 6/3: 129 6/4: 132 6/5: 135 6/6: 134 6/7: 135 6/8: 136  Pt states he has edema to BLE from toes to above knees. States he wears compression stockings when OOB although he has not put them on today. Pt also states he does elevate legs but states this is just when in bed. He states this improves edema to right leg but not to his left leg. 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 He states he is on torsemide and although rx is PRN, he was advised by someone last week to take daily. He states it doesn't seem to be working because it makes him urinate but does not seem to be pulling off fluid. Questioned fluid intake. Pt did not provide estimate of fluid intake but states he balances how much he is drinking with how much he is urinating? Questioned if any other sx to BLE. Pt states legs ache. He also states he has 'places oozing fluid b/n knee and ankle/mid-calf' of BLE. He states edema and weeping started about 5 days ago. Pt states SOB started about 5 days ago as well. He states he is a little SOB w/o exertion and has DOE when ambulatory with walker. Denies indiscretion with salt but does state most vegetables he eats are canned.  Per pt, BP's in 110's/60's HR 70-110 with hx a fib

## 2018-08-13 NOTE — Telephone Encounter (Signed)
Patient needs to stop the salt, wear compression hose, and continue to take the torsemide daily as directed. He will need to have labs, BMET,  BNP this week, hopefully tomorrow. He will need to see his primary cardiologist virtually or in person within a week.

## 2018-08-14 NOTE — Telephone Encounter (Signed)
ok 

## 2018-08-14 NOTE — Telephone Encounter (Signed)
Spoke with Estill Bamberg from [facility] and notified her that BMP and BNP were ordered. She will have these labs drawn and faxed to [phone]

## 2018-08-15 ENCOUNTER — Encounter: Payer: Self-pay | Admitting: Cardiovascular Disease

## 2018-08-16 ENCOUNTER — Telehealth: Payer: Self-pay | Admitting: Cardiovascular Disease

## 2018-08-16 NOTE — Telephone Encounter (Signed)
Please have him seen in the office tomorrow.

## 2018-08-16 NOTE — Telephone Encounter (Signed)
Spoke with patient wife- she states that the patient has gained 3lbs since yesterday; continues to increase in weight.  Wife states he is becoming SOB, and it is worsening, denies chest pains, continues to take Lasix as prescribed. Blood pressure today and HR-102/60 HR 104 They are requesting to see if he should be seen sooner.   He did have blood work completed by home health, and they were faxing over results. Will ask on site nurses to check to see if blood work has come in-   Will route to DOD to have them look over chart for recommendations.  Thank you!

## 2018-08-16 NOTE — Telephone Encounter (Signed)
New message   Pt c/o swelling: STAT is pt has developed SOB within 24 hours  1) How much weight have you gained and in what time span? Patient's wife states that has gained 11 lbs in a week   2) If swelling, where is the swelling located? Legs, ankles and feet   3) Are you currently taking a fluid pill?yes   4) Are you currently SOB? Yes   5) Do you have a log of your daily weights (if so, list)? Yes   6) Have you gained 3 pounds in a day or 5 pounds in a week? Yes   7) Have you traveled recently? No

## 2018-08-16 NOTE — Telephone Encounter (Signed)
Can not find lab work.

## 2018-08-16 NOTE — Telephone Encounter (Signed)
Following up:   Pt's wife calling following up on getting a call back about on Lab results and confirmation on a plan of care going forward.

## 2018-08-16 NOTE — Telephone Encounter (Signed)
Spoke with pt wife, Follow up scheduled with dr berry tomorrow. She will contact the home health nurse and have his lab work faxed to [place], fax number given.

## 2018-08-17 ENCOUNTER — Encounter: Payer: Self-pay | Admitting: Cardiovascular Disease

## 2018-08-17 ENCOUNTER — Other Ambulatory Visit: Payer: Self-pay

## 2018-08-17 ENCOUNTER — Telehealth: Payer: Self-pay | Admitting: Cardiovascular Disease

## 2018-08-17 ENCOUNTER — Ambulatory Visit (INDEPENDENT_AMBULATORY_CARE_PROVIDER_SITE_OTHER): Payer: Medicare Other | Admitting: Cardiovascular Disease

## 2018-08-17 DIAGNOSIS — I10 Essential (primary) hypertension: Secondary | ICD-10-CM | POA: Diagnosis not present

## 2018-08-17 DIAGNOSIS — I6523 Occlusion and stenosis of bilateral carotid arteries: Secondary | ICD-10-CM | POA: Diagnosis not present

## 2018-08-17 DIAGNOSIS — I739 Peripheral vascular disease, unspecified: Secondary | ICD-10-CM

## 2018-08-17 DIAGNOSIS — Z951 Presence of aortocoronary bypass graft: Secondary | ICD-10-CM | POA: Diagnosis not present

## 2018-08-17 DIAGNOSIS — E785 Hyperlipidemia, unspecified: Secondary | ICD-10-CM

## 2018-08-17 DIAGNOSIS — I480 Paroxysmal atrial fibrillation: Secondary | ICD-10-CM

## 2018-08-17 DIAGNOSIS — R600 Localized edema: Secondary | ICD-10-CM | POA: Insufficient documentation

## 2018-08-17 MED ORDER — TORSEMIDE 20 MG PO TABS: 20.0000 mg | ORAL_TABLET | Freq: Two times a day (BID) | ORAL | 3 refills | Status: DC

## 2018-08-17 NOTE — Telephone Encounter (Signed)
error 

## 2018-08-17 NOTE — Patient Instructions (Addendum)
Medication Instructions:  Your physician has recommended you make the following change in your medication:  INCREASE YOUR TORSEMIDE (DEMADEX) 20MG  TO ONE TABLET BY MOUTH TWICE ADAY  If you need a refill on your cardiac medications before your next appointment, please call your pharmacy.   Lab work: Your physician recommends that you return for lab work in 7-10 DAYS: [name]  If you have labs (blood work) drawn today and your tests are completely normal, you will receive your results only by: Marland Kitchen MyChart Message (if you have MyChart) OR . A paper copy in the mail If you have any lab test that is abnormal or we need to change your treatment, we will call you to review the results.  Testing/Procedures: Your physician has requested that you have an echocardiogram. Echocardiography is a painless test that uses sound waves to create images of your heart. It provides your doctor with information about the size and shape of your heart and how well your heart's chambers and valves are working. This procedure takes approximately one hour. There are no restrictions for this procedure. LOCATION: [address], [place], [place] [address]   Follow-Up: At [facility], you and your health needs are our priority.  As part of our continuing mission to provide you with exceptional heart care, we have created designated Provider Care Teams.  These Care Teams include your primary Cardiologist (physician) and Advanced Practice Providers (APPs -  Physician Assistants and Nurse Practitioners) who all work together to provide you with the care you need, when you need it. You will need a follow up appointment in 2 weeks WITH DR. Shelva Majestic.

## 2018-08-17 NOTE — Assessment & Plan Note (Signed)
History of persistent A. fib rate controlled on diltiazem, anticoagulated on Eliquis.

## 2018-08-17 NOTE — Assessment & Plan Note (Signed)
History of essential hypertension her blood pressure measured at 122/68.  He is on Cardizem.

## 2018-08-17 NOTE — Telephone Encounter (Signed)
Results received and have been reviewed by dr.berry

## 2018-08-17 NOTE — Telephone Encounter (Signed)
[name], called wanting to know if you received lab results she faxed them over yesterday.  If you did not you can reach her at [phone].  Patient has office visit today at 3pm with Dr. Gwenlyn Found

## 2018-08-17 NOTE — Progress Notes (Signed)
08/17/2018 Angelgabriel Willmore [facility]   01-Jun-1945  [id number]  Primary Physician Garwin Brothers, MD Primary Cardiologist: [name] MD Lupe Carney, [place]  HPI:  [name] is a 73 y.o.  old thin-appearing Caucasian male patient of [name], formally a patient [name]. I last saw him in the office 08/25/2015. He has a long extensive vascular history back to 1994 when he had coronary bypass grafting at [facility]. He had redo bypass grafting at [facility] January 1996 with simultaneous right carotid endarterectomy performed by Dr. Donnetta Hutching. His other problems include hypertension, hyperlipidemia, peripheral vascular disease with occluded SFAs although with cardiac rehabilitation he no longer has claudication. He had right renal artery stenting by myself and Dr. Rollene Fare approximately 15 years ago with repeat intervention several years thereafter because of what sounds like re-in-stent restenosis. He's had annual renal Doppler studies which have shown progressive left renal artery stenosis with slight decrease in the left kidney dimensions. His blood pressures have been well-controlled and his renal function is normal.  He has persistent A. fib on Eliquis, rate controlled.  He was recently hospitalized and had a non-STEMI thought to be related to demand ischemia.  He had stenting of his SMA and celiac axis by Dr. 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  He was sent back to me as an add-on today for progressive lower extremity edema over the last several weeks.  He is gained 10 pounds.  Is on torsemide once a day with 3+ edema.   Current Meds  Medication Sig  . aspirin EC 81 MG EC tablet Take 1 tablet (81 mg total) by mouth daily.  . calcium carbonate (TUMS - DOSED IN MG ELEMENTAL CALCIUM) 500 MG chewable tablet Chew 1 tablet (200 mg of elemental calcium total) by mouth 3 (three) times daily as needed for indigestion or heartburn.  . diltiazem (CARDIZEM CD) 120  MG 24 hr capsule Take 1 capsule (120 mg total) by mouth daily.  Marland Kitchen diltiazem (CARDIZEM) 30 MG tablet Take 1 tablet (30 mg total) by mouth as needed (for heart rate greater than 120).  Marland Kitchen dutasteride (AVODART) 0.5 MG capsule Take 0.5 mg by mouth daily.   Marland Kitchen ELIQUIS 5 MG TABS tablet TAKE 1 TABLET BY MOUTH TWICE A DAY (Patient taking differently: Take 5 mg by mouth 2 (two) times daily. )  . esomeprazole (NEXIUM) 40 MG capsule Take 1 capsule (40 mg total) by mouth 2 (two) times daily before a meal.  . ezetimibe (ZETIA) 10 MG tablet TAKE 1 TABLET (10 MG TOTAL) BY MOUTH DAILY. (Patient taking differently: Take 10 mg by mouth daily. )  . Ketotifen Fumarate (ALAWAY OP) Place 1 drop into the right eye daily.  Marland Kitchen loratadine (CLARITIN) 10 MG tablet Take 10 mg by mouth daily as needed for allergies, rhinitis or itching.   . lovastatin (MEVACOR) 40 MG tablet Take 20 mg by mouth at bedtime.  . Multiple Vitamins-Minerals (MULTIVITAMINS THER. W/MINERALS) TABS Take 1 tablet by mouth daily.    . nitroGLYCERIN (NITROLINGUAL) 0.4 MG/SPRAY spray Place 1 spray under the tongue every 5 (five) minutes x 3 doses as needed for chest pain.  . ranolazine (RANEXA) 1000 MG SR tablet Take 500 mg by mouth 2 (two) times daily.  Marland Kitchen torsemide (DEMADEX) 20 MG tablet Take 20 mg by mouth as needed (for fluid). FOR SWELLING OR WEIGHT GAIN  . vitamin E 400 UNIT capsule Take 400 Units by mouth daily.  Allergies  Allergen Reactions  . Codeine Nausea And Vomiting  . Sulfa Antibiotics Rash  . Sulfonamide Derivatives Rash    Social History   Socioeconomic History  . Marital status: Married    Spouse name: Not on file  . Number of children: Not on file  . Years of education: Not on file  . Highest education level: Not on file  Occupational History  . Not on file  Social Needs  . Financial resource strain: Not hard at all  . Food insecurity    Worry: Never true    Inability: Never true  . Transportation needs    Medical: No     Non-medical: No  Tobacco Use  . Smoking status: Former Smoker    Packs/day: 1.00    Years: 38.00    Pack years: 38.00    Types: Cigarettes    Quit date: 02/09/2005    Years since quitting: 13.5  . Smokeless tobacco: Never Used  . Tobacco comment: "quit smoking cigarrettes 2006"  Substance and Sexual Activity  . Alcohol use: No    Alcohol/week: 0.0 standard drinks  . Drug use: No  . Sexual activity: Not on file  Lifestyle  . Physical activity    Days per week: 0 days    Minutes per session: 0 min  . Stress: Only a little  Relationships  . Social Herbalist on phone: Not on file    Gets together: Not on file    Attends religious service: Not on file    Active member of club or organization: No    Attends meetings of clubs or organizations: Never    Relationship status: Married  . Intimate partner violence    Fear of current or ex partner: No    Emotionally abused: No    Physically abused: No    Forced sexual activity: No  Other Topics Concern  . Not on file  Social History Narrative  . Not on file     Review of Systems: General: negative for chills, fever, night sweats or weight changes.  Cardiovascular: negative for chest pain, dyspnea on exertion, edema, orthopnea, palpitations, paroxysmal nocturnal dyspnea or shortness of breath Dermatological: negative for rash Respiratory: negative for cough or wheezing Urologic: negative for hematuria Abdominal: negative for nausea, vomiting, diarrhea, bright red blood per rectum, melena, or hematemesis Neurologic: negative for visual changes, syncope, or dizziness All other systems reviewed and are otherwise negative except as noted above.    Blood pressure 122/68, pulse 98, temperature 98.7 F (37.1 C), height 5\' 5"  (1.651 m), weight 143 lb (64.9 kg).  General appearance: alert and no distress Neck: no adenopathy, no carotid bruit, no JVD, supple, symmetrical, trachea midline and thyroid not enlarged,  symmetric, no tenderness/mass/nodules Lungs: clear to auscultation bilaterally Heart: irregularly irregular rhythm Extremities: 2-3+ pitting bilateral lower extremity edema Pulses: 2+ and symmetric Skin: Skin color, texture, turgor normal. No rashes or lesions Neurologic: Alert and oriented X 3, normal strength and tone. Normal symmetric reflexes. Normal coordination and gait  EKG not performed today  ASSESSMENT AND PLAN:   Essential hypertension History of essential hypertension her blood pressure measured at 122/68.  He is on Cardizem.  Hx of CABG '84, '96, last PCI 2012 History of CAD status post CABG at [facility] in 1982 and redo in 1996.  Has had left main stenting in 1998, LAD and RCA stenting in 2005 and circumflex obtuse marginal branch vein graft stenting in 2012.  His last Myoview performed May 2015 showed mild inferobasal ischemia likely in the RCA distribution.  He denies chest pain.  PVD (peripheral vascular disease) ([place]) History of peripheral arterial disease with bilateral lower extremity involvement with occluded SFAs bilaterally  Carotid disease, bilateral (HCC) History of carotid artery disease carotid endarterectomy by Dr. early redo bypass surgery in 1996.  Hyperlipidemia LDL goal <70 History of hyperlipidemia on Zetia and lovastatin with lipid profile performed 05/25/2018 revealing total cholesterol 143, LDL 63 and HDL 63  PAF (paroxysmal atrial fibrillation) (HCC) History of persistent A. fib rate controlled on diltiazem, anticoagulated on Eliquis.  Bilateral lower extremity edema History of bilateral lower extremity edema worse since his recent hospitalization on torsemide once a day with a serum creatinine of 1.3.  He has had progressive weight gain.  I am going to double his torsemide to twice a day, check a basic metabolic panel in 7 days, 2D echocardiogram and follow-up with Dr. Claiborne Billings in 2 weeks.      [name] MD FACP,FACC,FAHA, [facility]  08/17/2018 3:59 PM

## 2018-08-17 NOTE — Assessment & Plan Note (Signed)
History of hyperlipidemia on Zetia and lovastatin with lipid profile performed 05/25/2018 revealing total cholesterol 143, LDL 63 and HDL 63

## 2018-08-17 NOTE — Assessment & Plan Note (Signed)
History of CAD status post CABG at [facility] in 1982 and redo in 1996.  Has had left main stenting in 1998, LAD and RCA stenting in 2005 and circumflex obtuse marginal branch vein graft stenting in 2012.  His last Myoview performed May 2015 showed mild inferobasal ischemia likely in the RCA distribution.  He denies chest pain.

## 2018-08-17 NOTE — Assessment & Plan Note (Signed)
History of peripheral arterial disease with bilateral lower extremity involvement with occluded SFAs bilaterally

## 2018-08-17 NOTE — Assessment & Plan Note (Signed)
History of carotid artery disease carotid endarterectomy by Dr. early redo bypass surgery in 1996.

## 2018-08-17 NOTE — Assessment & Plan Note (Signed)
History of bilateral lower extremity edema worse since his recent hospitalization on torsemide once a day with a serum creatinine of 1.3.  He has had progressive weight gain.  I am going to double his torsemide to twice a day, check a basic metabolic panel in 7 days, 2D echocardiogram and follow-up with Dr. Claiborne Billings in 2 weeks.

## 2018-08-21 ENCOUNTER — Ambulatory Visit: Payer: Medicare Other | Admitting: Physician Assistant

## 2018-08-22 ENCOUNTER — Other Ambulatory Visit: Payer: Self-pay

## 2018-08-22 ENCOUNTER — Telehealth (HOSPITAL_COMMUNITY): Payer: Self-pay

## 2018-08-22 DIAGNOSIS — K551 Chronic vascular disorders of intestine: Secondary | ICD-10-CM

## 2018-08-22 NOTE — Telephone Encounter (Signed)
The above patient or their representative was contacted and gave the following answers to these questions:         Do you have any of the following symptoms? No  Fever                    Cough                   Shortness of breath  Do  you have any of the following other symptoms? No   muscle pain         vomiting,        diarrhea        rash         weakness        red eye        abdominal pain         bruising          bruising or bleeding              joint pain           severe headache    Have you been in contact with someone who was or has been sick in the past 2 weeks? No  Yes                 Unsure                         Unable to assess   Does the person that you were in contact with have any of the following symptoms?   Cough         shortness of breath           muscle pain         vomiting,            diarrhea            rash            weakness           fever            red eye           abdominal pain           bruising  or  bleeding                joint pain                severe headache               Have you  or someone you have been in contact with traveled internationally in th last month? No        If yes, which countries?   Have you  or someone you have been in contact with traveled outside [place] in th last month? No         If yes, which state and city?   COMMENTS OR ACTION PLAN FOR THIS PATIENT:          

## 2018-08-23 ENCOUNTER — Ambulatory Visit (INDEPENDENT_AMBULATORY_CARE_PROVIDER_SITE_OTHER): Payer: Medicare Other | Admitting: Vascular Surgery

## 2018-08-23 ENCOUNTER — Ambulatory Visit (HOSPITAL_COMMUNITY)
Admission: RE | Admit: 2018-08-23 | Discharge: 2018-08-23 | Disposition: A | Discharge status: Home / Self Care | Payer: Medicare Other | Source: Ambulatory Visit | Attending: Vascular Surgery | Admitting: Vascular Surgery

## 2018-08-23 ENCOUNTER — Other Ambulatory Visit: Payer: Self-pay

## 2018-08-23 VITALS — BP 120/68 | HR 100 | Temp 97.6°F | Resp 20 | Ht 65.0 in | Wt 140.4 lb

## 2018-08-23 DIAGNOSIS — K551 Chronic vascular disorders of intestine: Secondary | ICD-10-CM

## 2018-08-23 NOTE — Progress Notes (Signed)
Patient is a 73 year old male who returns for follow-up today.  He recently underwent celiac and superior mesenteric artery stenting May 2020.  Was a 6 mm Herculink balloon expandable stent in both locations. He currently has no abdominal pain.  He states he has gained some weight although he thinks most of this is lower extremity edema.  He has no postprandial pain.  He did have to have compression of a left femoral pseudoaneurysm and states that this is improved.  He has also had a carotid endarterectomy and redo by Dr. Donnetta Hutching in the remote past.  He recently saw Dr. Alvester Chou who increased his furosemide dose for his leg swelling.  He is followed long-term by Dr. Claiborne Billings from cardiology.  Review of systems: He denies shortness of breath.  He is overall still fairly deconditioned from a recent hip fracture.  He also states he has scrotal edema.  Current Outpatient Medications on File Prior to Visit  Medication Sig Dispense Refill  . aspirin EC 81 MG EC tablet Take 1 tablet (81 mg total) by mouth daily. 30 tablet 0  . calcium carbonate (TUMS - DOSED IN MG ELEMENTAL CALCIUM) 500 MG chewable tablet Chew 1 tablet (200 mg of elemental calcium total) by mouth 3 (three) times daily as needed for indigestion or heartburn. 30 tablet 0  . diltiazem (CARDIZEM CD) 120 MG 24 hr capsule Take 1 capsule (120 mg total) by mouth daily. 30 capsule 6  . diltiazem (CARDIZEM) 30 MG tablet Take 1 tablet (30 mg total) by mouth as needed (for heart rate greater than 120). 120 tablet 0  . dutasteride (AVODART) 0.5 MG capsule Take 0.5 mg by mouth daily.     Marland Kitchen ELIQUIS 5 MG TABS tablet TAKE 1 TABLET BY MOUTH TWICE A DAY (Patient taking differently: Take 5 mg by mouth 2 (two) times daily. ) 180 tablet 3  . esomeprazole (NEXIUM) 40 MG capsule Take 1 capsule (40 mg total) by mouth 2 (two) times daily before a meal. 120 capsule 0  . ezetimibe (ZETIA) 10 MG tablet TAKE 1 TABLET (10 MG TOTAL) BY MOUTH DAILY. (Patient taking differently:  Take 10 mg by mouth daily. ) 90 tablet 3  . Ketotifen Fumarate (ALAWAY OP) Place 1 drop into the right eye daily.    Marland Kitchen loratadine (CLARITIN) 10 MG tablet Take 10 mg by mouth daily as needed for allergies, rhinitis or itching.   3  . lovastatin (MEVACOR) 40 MG tablet Take 20 mg by mouth at bedtime.    . Multiple Vitamins-Minerals (MULTIVITAMINS THER. W/MINERALS) TABS Take 1 tablet by mouth daily.      . nitroGLYCERIN (NITROLINGUAL) 0.4 MG/SPRAY spray Place 1 spray under the tongue every 5 (five) minutes x 3 doses as needed for chest pain. 4.9 g 2  . ranolazine (RANEXA) 1000 MG SR tablet Take 500 mg by mouth 2 (two) times daily.    Marland Kitchen torsemide (DEMADEX) 20 MG tablet Take 1 tablet (20 mg total) by mouth 2 (two) times daily. FOR SWELLING OR WEIGHT GAIN 180 tablet 3  . vitamin E 400 UNIT capsule Take 400 Units by mouth daily.     . sucralfate (CARAFATE) 1 GM/10ML suspension Take 10 mLs (1 g total) by mouth 4 (four) times daily -  with meals and at bedtime for 14 days. 420 mL 1   No current facility-administered medications on file prior to visit.      Physical exam:  Vitals:   08/23/18 0921  BP: 120/68  Pulse:  100  Resp: 20  Temp: 97.6 F (36.4 C)  SpO2: 98%  Weight: 140 lb 6.4 oz (63.7 kg)  Height: 5\' 5"  (1.651 m)    Abdomen: Soft nontender nondistended  Extremities: 2+ femoral pulses bilaterally no obvious hematoma  Data: Patient had a duplex ultrasound of his mesenteric vessels today which was suboptimal due to acoustic shadowing.  The stents were patent but it was difficult to determine how much stenosis might be present.  Assessment: Doing well status post celiac and superior mesenteric artery stenting currently asymptomatic.  Plan: The patient will follow-up in 3 months time.  We will do a CT Angie of the abdomen and pelvis at that time since we were not able to adequately visualize his stents today on duplex exam.  Patient will call sooner if he develops any recurrent abdominal  symptoms.  We will continue his statin and aspirin.  He is on Eliquis for a long-term chronic atrial fibrillation.  Ruta Hinds, MD [facility] Office: [phone] Pager: [phone]

## 2018-08-24 ENCOUNTER — Telehealth (HOSPITAL_COMMUNITY): Payer: Self-pay | Admitting: Radiology

## 2018-08-24 NOTE — Telephone Encounter (Signed)

## 2018-08-27 ENCOUNTER — Ambulatory Visit (HOSPITAL_COMMUNITY): Payer: Medicare Other | Attending: Cardiovascular Disease

## 2018-08-27 ENCOUNTER — Ambulatory Visit: Payer: Medicare Other | Admitting: Cardiovascular Disease

## 2018-08-27 ENCOUNTER — Other Ambulatory Visit: Payer: Self-pay

## 2018-08-27 DIAGNOSIS — R600 Localized edema: Secondary | ICD-10-CM | POA: Diagnosis not present

## 2018-08-27 DIAGNOSIS — Z951 Presence of aortocoronary bypass graft: Secondary | ICD-10-CM | POA: Diagnosis present

## 2018-08-28 ENCOUNTER — Ambulatory Visit: Payer: Medicare Other | Admitting: Internal Medicine

## 2018-08-31 ENCOUNTER — Telehealth: Payer: Self-pay | Admitting: Cardiovascular Disease

## 2018-08-31 NOTE — Telephone Encounter (Signed)
LVM for pt, reminding him of his appt on 09-03-18 with Dr Claiborne Billings.

## 2018-09-03 ENCOUNTER — Ambulatory Visit (INDEPENDENT_AMBULATORY_CARE_PROVIDER_SITE_OTHER): Payer: Medicare Other | Admitting: Cardiovascular Disease

## 2018-09-03 ENCOUNTER — Other Ambulatory Visit: Payer: Self-pay

## 2018-09-03 VITALS — BP 130/62 | HR 96 | Temp 98.8°F | Ht 65.0 in | Wt 142.0 lb

## 2018-09-03 DIAGNOSIS — I4821 Permanent atrial fibrillation: Secondary | ICD-10-CM

## 2018-09-03 DIAGNOSIS — I2510 Atherosclerotic heart disease of native coronary artery without angina pectoris: Secondary | ICD-10-CM | POA: Diagnosis not present

## 2018-09-03 DIAGNOSIS — Z951 Presence of aortocoronary bypass graft: Secondary | ICD-10-CM | POA: Diagnosis not present

## 2018-09-03 DIAGNOSIS — R600 Localized edema: Secondary | ICD-10-CM

## 2018-09-03 DIAGNOSIS — K551 Chronic vascular disorders of intestine: Secondary | ICD-10-CM

## 2018-09-03 DIAGNOSIS — I4510 Unspecified right bundle-branch block: Secondary | ICD-10-CM

## 2018-09-03 DIAGNOSIS — E785 Hyperlipidemia, unspecified: Secondary | ICD-10-CM

## 2018-09-03 NOTE — Progress Notes (Signed)
Patient ID: [name], male   DOB: 06-01-1945, 73 y.o.   MRN: [id number]    Primary MD:  Dr. Garwin Brothers  HPI: [name] is a 73 y.o. male who is a former patient of Dr. Rollene Fare.  He presents for a 72monthfollow-up cardiology evaluation.  Mr. DBrenan Modestohas an extensive cardiac and peripheral vascular history. [place] In [place] he required re-do CABG revascularization surgery after all grafts were found to be occluded. At that time he also underwent right carotid endarterectomy. In 1998 he underwent stenting of his left main coronary artery and also had stenting done to the vein graft supplying the marginal vessel. He has history of right renal artery stenosis and underwent initial stenting with predilatation 2005. He has documented chronic right bundle branch block. He also significant peripheral vascular disease with bilateral SFA occlusion. He has known left subclavian artery occlusion with right subclavian stenosis. Has a history of hypertension, hyperlipidemia, and remotely smoked cigarettes. His last catheterization was done in November 2012 which showed significant native CAD with evidence for patent left main stent. He had an occluded circumflex marginal vessel from the AV groove circumflex. There was 80% ostial RCA stenosis with 90% stenosis in the mid right coronary artery proximal to the RV marginal branch and total occlusion of the mid RCA. An occluded vein graft supplying the right coronary artery but evidence for collateralization to the distal right artery the left coronary injection and injection of the grafts. The vein supplying the marginal vessel with the previously placed ostial proximal Taxus stent had in-stent narrowing of 50% followed by 95% stenosis in the body of the proximal third of the vessel graft prior to the previously placed mid grafts 3.0x20 mm Taxus stent. At that time, he underwent difficult but  successful intervention involving long segmental stenosis of the vein graft supplying the circumflex marginal vessel. Additional tandem 3.0x38 mm and 3.0x8 mm Taxus stents were inserted with excellent angiographic result.   He saw Dr. WRollene Farein August 2014 and due to concern for progressive carotid stenoses repeat Doppler studies were done and he also saw Dr. TSherren Mochaearly for followup evaluation. Repeat imaging showed a patent right carotid endarterectomy site the Doppler velocity suggestive of 40  percent stenosis of the bilateral proximal internal carotid arteries. There was left subclavian steal noted with retrograde left vertebral artery flow, monophasic left subclavian artery flow and significant difference in the bilateral brachial pressures.  He established cardiology care with me in December 2014.  A nuclear perfusion study in May 2015 demonstrated mild inferobasal ischemia, most likely secondary to RCA distribution.    On 03/25/2014 he was seen by Dr. JMartiniqueafter he developed an episode of atrial fibrillation the day before.  An ECG in Dr. ALatina Craver office showed atrial fibrillation with rate of 120.  His atenolol was increased to 50 mg twice a day, and he converted to sinus rhythm after 18 hours.  When he saw Dr. JMartiniquethe following day he was in sinus rhythm.  On the increased dose of beta blocker he denies recurrent palpitations.  He tells me he recently underwent a colonoscopy and had 3 polyps removed, but also required cauterization for another site that was oozing blood.  This reason, he was not restarted on anticoagulation but has been maintained on low-dose aspirin and Plavix.  He has seen Dr. BGwenlyn Foundin follow-up of his PVD and is status post left  renal stenting.  He also is followed by Dr. Donnetta Hutching for his carotid disease.  In August 2017 he was seen by Kerin Ransom after he was found to be in atrial fibrillation 1 week previously at cardiac rehabilitation.  He was started on amiodarone 200 mg  twice a day for 1 week and then 200 mg daily and his atenolol was reduced to 25 mg twice a day.  He was seen several weeks later on 10/29/2015 by Lesia Hausen and was back in sinus rhythm.  He underwent carotid duplex imaging on 12/23/2015.  The right carotid endarterectomy site had Doppler velocities suggestive of a 60-79% stenosis. The left internal carotid artery velocity suggested a 40-59% stenoses.  Renal duplex imaging to just a patent right renal artery stent.  There was stable greater than 60% left renal artery stenosis.    When I  saw him in November 2017 he was maintaining sinus rhythm and  since he was on amiodarone, I recommended he reduce lovastatin to 20 mg and added Zetia for continued aggressive lipid management.  He had progressive lower extremity edema and recommended he increase his torsemide.  In March 2018 he developed episodes of bronchitis.  Since that time his rhythm has been irregular.  He wore a Holter monitor which showed atrial fibrillation with a minimum rate at 52 at 4:02 AM was sleeping and maximum rate at 119.  In the afternoon with an average rate of 82 bpm.  There are frequent PVCs, rare couplets, occasional episodes of bigeminy and trigeminy but no episodes of VT.  There were no pauses.  Previously, his blood pressure had become low and hehad been on losartan and I saw him one month ago there was leg swelling, left greater than right.  He had a chest x-ray with Dr. Reesa Chew on 06/21/2016.  A  follow-up carotid Doppler study in April 2018 showed improvement in his velocities such that his right carotid endarterectomy was patent with velocities now suggestive of 40-59% stenosis.  The left internal carotid velocities suggested a 1-39% stenosis.   When I saw him in May 2018, he was in atrial fibrillation.  I discontinued Plavix and started him on eliquis 5 mg twice a day.  I recommended that he not to take aspirin with his GI history.  I increased amiodarone to 200 mg twice a day  and reduced his Ranexa dose to 500 mg twice a day with concomitant therapy.  He was having lower extremity edema.  A Doppler study was negative for DVT by his primary physician.  I recommended support stockings.    I saw him 1 month later and he continued to be in atrial fibrillation.  At that time, we discussed potential DC cardioversion for restoration of sinus rhythm.  He underwent repeat echo on 08/24/2016 which showed an EF of 55-60%.  His left atrium was normal in size and his right atrium was mildly dilated.  PA pressure was increased at 48 mm.  There was moderate LVH. Marland Kitchen  He apparently was seen subsequent to that evaluation by Almyra Deforest and was having 2+ bilateral lower extremity edema.  Further discussion concerning cardioversion was undertaken but the patient initially deferred at that point and wanted to that it further.  He tells me 1 month ago he noticed some bright red blood per rectum.  As result, he self reduced his eliquis from 5 mg twice a day down to 5 mg daily.  Remotely, he has seen Dr. Scarlette Shorts in the  past.  His last colonoscopy was at least 5 years ago.  He had stopped taking amiodarone since he felt to contribute to fatigue, tremors, dizziness, and gait staggering.  The symptomshave improved off amiodarone antiarrhythmic therapy.  Her, he has noticed that at times his pulse can increase up to the low 100s with minimal activity.  He has now been using compression stockings for his ankle edema which has improved.    When I  saw him in November 2018 , I recommended eliquis 5 mg twice a day for full anticoagulation.  I started him on metoprolol 50 mg twice a day  I discussed options concerning continuing permanent AF versus possible Tikosyn or AF ablation and referred him for  EP evaluation.  He saw Dr. Curt Bears in January 2019. Due to his QTC prolongation  he was not felt to be a candidate for Tikosyn or sotalol a and also was felt to be high risk for ablation.  As result he recommended  reiitiation of low-dose amiodarone.n 04/12/2017.  He underwent successful cardioversion.  When he saw Kerin Ransom on 04/19/2017.  At that time, the patient had complaints of increasing dyspnea on exertion and orthopnea, which were similar to his previous symptoms on amiodarone.  When he last saw Shanon Brow a on May 02, 2017 he also complained of a fine tremor since institution of amiodarone, and amiodarone  was reduced to 100 mg every other day.   He underwent carotid duplex imaging which showed mild bilateral carotid plaque 40-59% on the right and 1-39% in the left.  In addition, an abdominal renal duplex study showed patent right renal artery stent, moderate left renal artery stenosis, and significant SMA stenosis.  This was reviewed by Dr. Gwenlyn Found.  When seen in March 2019 he felt his tremor and loss of coordination on the higher dose of amiodarone  improved with the lower dose amiodarone.  He was not been taking Demadex for the past 10 days and this has resulted in increasing leg swelling.  Subsequently, he was seen in the atrial fibrillation clinic by Orson Eva several occasions, last seen Jul 14, 2017.  Prior to that evaluation he had seen his PCP for intermittent fevers.  When last seen in A. fib clinic he was in sinus rhythm. He denies any recurrent symptoms.  He continues to be on amiodarone 100 mg every other day and Eliquis for anticoagulation.  He is on metoprolol 75 mg twice a day, Ranexa 500 mg twice a day and his dose was reduced when amiodarone was instituted.  He is on lovastatin 40 mg for hyperlipidemia.  He has been taking torsemide on an as-needed basis.  He has a prescription for diltiazem 30 mg to take if his heart rate increases above 100 and long as blood pressure is greater than 100.    When I saw him in September 2019 he was back in atrial fibrillation with ventricular rate at 76 bpm.  His right bundle branch block was stable and there was evidence for left posterior hemiblock.   Had only been taking amiodarone 100 mg every other day and I suggested that he further titrate his back to 100 mg daily.  With this slightly higher dose he has noticed some slight increase in some balance and coordination issues.  He denies any anginal symptoms.  He has noted some leg swelling bilaterally.   Since I last saw him in November 2019, he has seen Dr. Curt Bears in January 2020.  He was not  atrial fibrillation and has not maintained sinus rhythm after his cardioversion.  At that time, he recommended discontinuance of amiodarone and that he continue in permanent atrial fibrillation.  At the end of February 2020 he developed increasing chest congestion, went to urgent care and was treated with antibiotics for 10 days.  Reportedly a chest x-ray was negative.  He has continued to have chest congestion.    Since I last saw him in March 2020 he fractured his right hip in April and spent 4 days at [facility].  In May he underwent PV intervention by Dr. Ruta Hinds with stenting of his celiac artery and superior mesenteric artery due to high-grade stenoses and evidence for chronic mesenteric ischemia.  He developed a pseudoaneurysm which was successfully compressed on Jul 27, 2018.  He saw Dr. Gwenlyn Found in June 2020 for progressive lower extremity edema who doubled his torsemide to twice a day.  Subsequently he apparently was given a prescription for metolazone by Dr. Virgina Norfolk to take on Monday Wednesday and Friday.  He presents for evaluation.  Past Medical History:  Diagnosis Date  . Anemia   . Angina   . Atrial fibrillation ([place])   . Blood transfusion   . Bronchitis   . Bulging discs    "8 of them; thoracic, lumbar, sacral area"  . Chronic back pain   . Colon polyps   . Complication of anesthesia   . Coronary artery disease 01/24/11   Successful PCI long segmental stensois  vein graft to the CX marginal vessel-graft previously stented prox. 3.0x62m TAXUS stent mid seg 3.0x255mTAXUS stent  now tandem stens of 3.0x3832m 3.0x8mm60maced with the seg. 50,80 & 99% stenosis reduced to 0%  . DDD (degenerative disc disease)   . Diastolic heart failure (HCC)[place]. Dysrhythmia    "PAC's and PVC's"  . Esophageal dilatation    "have had it done 7 times; last time ~ 2001"  . Esophageal stricture   . GERD (gastroesophageal reflux disease)   . Headache(784.0)   . Hiatal hernia   . Hyperlipidemia   . Myocardial infarction (HCC)[place]91  [facility]   "had 3 heart attacks this year"  . Paroxysmal atrial fibrillation (HCC)   . Pyloric stenosis   . RBBB   . Renal artery stenosis (HCC)    hhistory of right renal artery stenting and re-intervention for "in-stent restenosis.  . [place] CAB[place]cor[place]ary artery [place]    "when I had the heart attacks"  . Subclavian artery stenosis, left (HCC)[place]. Ulcer    "small; Dr. PerrHenrene Pastornd it 02/2010"    Past Surgical History:  Procedure Laterality Date  . ADENOIDECTOMY     "when I was an infant"  . APPENDECTOMY  1953  . BIOPSY  07/26/2018   Procedure: BIOPSY;  Surgeon: BeavThornton Park;  [facility];;  . CARDIOVERSION N/A 04/17/2017   Procedure: CARDIOVERSION;  Surgeon: SkaiJerline Pain;  [facility];  Service: Cardiovascular;  Laterality: N/A;  . CAROTID ENDARTERECTOMY Right 1996[place]/19/12   "today makes a total of 9 stents"  . CORONARY ARTERY BYPASS GRAFT  1984   CABG X 4  . CORONARY ARTERY BYPASS GRAFT  1996   CABG X 4  . ESOPHAGOGASTRODUODENOSCOPY (EGD) WITH PROPOFOL N/A  07/26/2018   Procedure: ESOPHAGOGASTRODUODENOSCOPY (EGD) WITH PROPOFOL;  Surgeon: Thornton Park, MD;  Location: [place];  Service: Gastroenterology;  Laterality: N/A;  . LEFT HEART CATHETERIZATION WITH CORONARY/GRAFT ANGIOGRAM N/A 01/24/2011   Procedure: LEFT HEART  CATHETERIZATION WITH Beatrix Fetters;  Surgeon: Troy Sine, MD;  Location: [facility] CATH LAB;  Service: Cardiovascular;  Laterality: N/A;  . PERIPHERAL VASCULAR INTERVENTION  07/16/2018   Procedure: PERIPHERAL VASCULAR INTERVENTION;  Surgeon: Elam Dutch, MD;  Location: [place] CV LAB;  Service: Cardiovascular;;  SMA and Celiac  . pylondial cyst removal    . RETINAL DETACHMENT SURGERY  04/2007   right  . TONSILLECTOMY  1972  . VISCERAL ANGIOGRAPHY N/A 07/16/2018   Procedure: VISCERAL ANGIOGRAPHY;  Surgeon: Elam Dutch, MD;  Location: [place] CV LAB;  Service: Cardiovascular;  Laterality: N/A;    Allergies  Allergen Reactions  . Codeine Nausea And Vomiting  . Sulfa Antibiotics Rash  . Sulfonamide Derivatives Rash    Current Outpatient Medications  Medication Sig Dispense Refill  . aspirin EC 81 MG EC tablet Take 1 tablet (81 mg total) by mouth daily. 30 tablet 0  . calcium carbonate (TUMS - DOSED IN MG ELEMENTAL CALCIUM) 500 MG chewable tablet Chew 1 tablet (200 mg of elemental calcium total) by mouth 3 (three) times daily as needed for indigestion or heartburn. 30 tablet 0  . diltiazem (CARDIZEM CD) 120 MG 24 hr capsule Take 1 capsule (120 mg total) by mouth daily. 30 capsule 6  . diltiazem (CARDIZEM) 30 MG tablet Take 1 tablet (30 mg total) by mouth as needed (for heart rate greater than 120). 120 tablet 0  . dutasteride (AVODART) 0.5 MG capsule Take 0.5 mg by mouth daily.     Marland Kitchen ELIQUIS 5 MG TABS tablet TAKE 1 TABLET BY MOUTH TWICE A DAY (Patient taking differently: Take 5 mg by mouth 2 (two) times daily. ) 180 tablet 3  . ezetimibe (ZETIA) 10 MG tablet TAKE 1 TABLET (10 MG TOTAL) BY MOUTH DAILY. (Patient taking differently: Take 10 mg by mouth daily. ) 90 tablet 3  . Ketotifen Fumarate (ALAWAY OP) Place 1 drop into the right eye daily.    Marland Kitchen loratadine (CLARITIN) 10 MG tablet Take 10 mg by mouth daily as needed for allergies, rhinitis or itching.   3  .  lovastatin (MEVACOR) 40 MG tablet Take 20 mg by mouth at bedtime.    . Multiple Vitamins-Minerals (MULTIVITAMINS THER. W/MINERALS) TABS Take 1 tablet by mouth daily.      . nitroGLYCERIN (NITROLINGUAL) 0.4 MG/SPRAY spray Place 1 spray under the tongue every 5 (five) minutes x 3 doses as needed for chest pain. 4.9 g 2  . ranolazine (RANEXA) 1000 MG SR tablet Take 500 mg by mouth 2 (two) times daily.    Marland Kitchen torsemide (DEMADEX) 20 MG tablet Take 1 tablet (20 mg total) by mouth 2 (two) times daily. FOR SWELLING OR WEIGHT GAIN 180 tablet 3  . vitamin E 400 UNIT capsule Take 400 Units by mouth daily.     Marland Kitchen esomeprazole (NEXIUM) 40 MG capsule Take 1 capsule (40 mg total) by mouth 2 (two) times daily before a meal. 120 capsule 3  . senna-docusate (SENOKOT-S) 8.6-50 MG tablet Take 1 tablet by mouth daily. 30 tablet 6  . sucralfate (CARAFATE) 1 GM/10ML suspension Take 10 mLs (1 g total) by mouth 4 (four) times daily -  with meals and at bedtime. 1200 mL 11   No current facility-administered medications  for this visit.     Social History   Socioeconomic History  . Marital status: Married    Spouse name: Not on file  . Number of children: Not on file  . Years of education: Not on file  . Highest education level: Not on file  Occupational History  . Not on file  Social Needs  . Financial resource strain: Not hard at all  . Food insecurity    Worry: Never true    Inability: Never true  . Transportation needs    Medical: No    Non-medical: No  Tobacco Use  . Smoking status: Former Smoker    Packs/day: 1.00    Years: 38.00    Pack years: 38.00    Types: Cigarettes    Quit date: 02/09/2005    Years since quitting: 13.5  . Smokeless tobacco: Never Used  . Tobacco comment: "quit smoking cigarrettes 2006"  Substance and Sexual Activity  . Alcohol use: No    Alcohol/week: 0.0 standard drinks  . Drug use: No  . Sexual activity: Not on file  Lifestyle  . Physical activity    Days per week: 0  days    Minutes per session: 0 min  . Stress: Only a little  Relationships  . Social Herbalist on phone: Not on file    Gets together: Not on file    Attends religious service: Not on file    Active member of club or organization: No    Attends meetings of clubs or organizations: Never    Relationship status: Married  . Intimate partner violence    Fear of current or ex partner: No    Emotionally abused: No    Physically abused: No    Forced sexual activity: No  Other Topics Concern  . Not on file  Social History Narrative  . Not on file   Socially he is married has 2 children 2 stepchildren. He quit tobacco in 2000.  Family History  Problem Relation Age of Onset  . Breast cancer Paternal Aunt   . Diabetes Sister   . Ulcers Father   . Heart disease Father   . Heart failure Father   . Heart attack Father   . AAA (abdominal aortic aneurysm) Father   . Stroke Mother   . Hypertension Mother   . 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 Henrene Pastor; mesenteric ischemia status post stenting of the celiac and superior mesenteric arteries. GU: Occasional concentrated or  dark urine without blood Musculoskeletal: Negative; no myalgias, joint pain, or weakness Hematologic/Oncology: Positive for anemia Endocrine: Negative; no heat/cold intolerance; no diabetes Neuro: Negative; no changes in balance, headaches Skin: Negative; No rashes or skin lesions Psychiatric: Negative; No behavioral problems, depression Sleep: Negative; No snoring, daytime sleepiness, hypersomnolence, bruxism, restless legs, hypnogognic hallucinations, no cataplexy Other comprehensive  14 point system review is negative.   PE BP 130/62   Pulse 96   Temp 98.8 F (37.1 C)   Ht 5' 5"  (1.651 m)   Wt 142 lb (64.4 kg)   SpO2 99%   BMI 23.63 kg/m    Repeat blood pressure by me 130/70  Wt  Readings from Last 3 Encounters:  09/05/18 137 lb (62.1 kg)  09/03/18 142 lb (64.4 kg)  08/23/18 140 lb 6.4 oz (63.7 kg)     Physical Exam BP 130/62   Pulse 96   Temp 98.8 F (37.1 C)   Ht 5' 5"  (1.651 m)   Wt 142 lb (64.4 kg)   SpO2 99%   BMI 23.63 kg/m  General: Alert, oriented, no distress.  Skin: normal turgor, no rashes, warm and dry HEENT: Normocephalic, atraumatic. Pupils equal round and reactive to light; sclera anicteric; extraocular muscles intact; Fundi ** Nose without nasal septal hypertrophy Mouth/Parynx benign; Mallinpatti scale Neck: No JVD, no carotid bruits; normal carotid upstroke Lungs: clear to ausculatation and percussion; no wheezing or rales Chest wall: without tenderness to palpitation Heart: PMI not displaced, irregularly irregular, pulse in the 90s, s1 s2 normal, 1/6 systolic murmur, no diastolic murmur, no rubs, gallops, thrills, or heaves Abdomen: soft, nontender; no hepatosplenomehaly, BS+; abdominal aorta nontender and not dilated by palpation. Back: no CVA tenderness Pulses 2+ Musculoskeletal: full range of motion, normal strength, no joint deformities Extremities: 2-3+ lower extremity edema no clubbing cyanosis, Homan's sign negative  Neurologic: grossly nonfocal; Cranial nerves grossly wnl Psychologic: Normal mood and affect  ECG (independently read by me): Atrial fibrillation at 96 bpm, right bundle branch block with repolarization changes.  May 25, 2018 ECG (independently read by me): Probable atrial flutter with 3-1 block and a ventricular rate at 85.  Bundle branch block with repolarization changes.    January 25, 2018 ECG (independently read by me): Atrial fibrillation at 77 bpm.  Right bundle branch block with repolarization  changes.  QTc interval 506 ms.  November 22, 2017 ECG (independently read by me): Atrial fibrillation at 76 bpm.  Right bundle branch block with repolarization changes.  Left posterior hemiblock.  Nonspecific ST-T changes  May 2019 ECG (independently read by me): Normal sinus rhythm at 63 bpm.  Right bundle branch block with repolarization changes.  Nonspecific ST changes.  QTc interval 493 ms.    March 2019 ECG (independently read by me): normal sinus rhythm at 64 bpm.  Right bundle branch block with repolarization changes.  Left posterior hemiblock.  QTc interval 497 ms.    November 2018 ECG (independently read by me): Atrial flutter with variable block.  PVC.  Left posterior hemiblock.  Right bundle branch block.  June 2018 ECG (independently read by me): Atrial fibrillation with ventricular rate at 70 bpm.  Right bundle-branch block with repolarization changes.  Probable left posterior fascicular block.  07/21/2016 ECG (independently read by me): atrial fibrillation with a controlled ventricular rate at 71 bpm.  Right bundle branch block and left posterior hemiblock.  Small inferior Q waves.  November 2017 ECG (independently read by me): Sinus rhythm with a PVC.  Right bundle branch block and probable left posterior hemiblock.  Inferior Q waves with preserved R waves.  February 2017 ECG (independently read by me): normal sinus rhythm at 61 bpm. Probable left atrial enlargement. Right bundle branch block.  ECG (independently read by me): Sinus bradycardia 58 bpm.  Increased QTc interval 473 ms.  Right bundle-branch block.  May 2016 ECG (independently read by me): Sinus bradycardia 59 bpm with right bundle branch block.I ST-T changes.  QTc interval 475 ms.  PR interval 182 ms.  No ectopy.  February 2016 ECG (independently read by me) normal sinus rhythm at 63 bpm.  Right bundle-branch block.  Probable left posterior fascicular block.  PR interval  170 ms.  QTC interval 480 ms.  November  2015 ECG (independently read by me): Normal sinus rhythm at 62 bpm.  Right bundle branch block.  Small nondiagnostic inferior Q waves.  Prior May 2015ECG : sinus rhythm at 58 beats per minute.  Right bundle branch block.  Prior December 2014 ECG: Sinus rhythm with sinus bradycardia 55 beats per minute. Right bundle branch block.  LABS: BMP Latest Ref Rng & Units 07/27/2018 07/26/2018 07/25/2018  Glucose 70 - 99 mg/dL 114(H) 100(H) 97  BUN 8 - 23 mg/dL 13 11 14   Creatinine 0.61 - 1.24 mg/dL 0.96 1.06 1.11  BUN/Creat Ratio 10 - 24 - - -  Sodium 135 - 145 mmol/L 134(L) 134(L) 132(L)  Potassium 3.5 - 5.1 mmol/L 3.9 3.8 3.9  Chloride 98 - 111 mmol/L 105 102 101  CO2 22 - 32 mmol/L 22 24 25   Calcium 8.9 - 10.3 mg/dL 8.3(L) 8.4(L) 8.2(L)   Hepatic Function Latest Ref Rng & Units 07/27/2018 07/26/2018 07/23/2018  Total Protein 6.5 - 8.1 g/dL 5.1(L) 5.3(L) 5.3(L)  Albumin 3.5 - 5.0 g/dL 2.8(L) 2.8(L) 2.7(L)  AST 15 - 41 U/L 17 16 15   ALT 0 - 44 U/L 11 10 12   Alk Phosphatase 38 - 126 U/L 93 84 82  Total Bilirubin 0.3 - 1.2 mg/dL 1.1 1.0 1.2  Bilirubin, Direct 0.0 - 0.2 mg/dL - - -   CBC Latest Ref Rng & Units 07/29/2018 07/28/2018 07/27/2018  WBC 4.0 - 10.5 K/uL - - 4.8  Hemoglobin 13.0 - 17.0 g/dL 8.0(L) 7.6(L) 7.4(L)  Hematocrit 39.0 - 52.0 % 24.4(L) 22.9(L) 22.3(L)  Platelets 150 - 400 K/uL - - 153   Lab Results  Component Value Date   MCV 97.0 07/27/2018   MCV 97.9 07/26/2018   MCV 97.8 07/25/2018   Lab Results  Component Value Date   TSH 0.857 07/24/2018    Lipid Panel     Component Value Date/Time   CHOL 143 05/25/2018 1027   TRIG 84 05/25/2018 1027   HDL 63 05/25/2018 1027   CHOLHDL 2.3 05/25/2018 1027   CHOLHDL 2.6 08/01/2014 0852   VLDL 18 08/01/2014 0852   LDLCALC 63 05/25/2018 1027     RADIOLOGY: No results found.  IMPRESSION:  1. Permanent atrial fibrillation   2. CAD in native artery   3. Hx of CABG '84, '96, last PCI 2012   4. Bilateral lower extremity edema    5. Chronic mesenteric ischemia ([place])   6. Hyperlipidemia with target LDL less than 70   7. RBBB     ASSESSMENT AND PLAN: Mr. Darran Gabay is a 73 year old gentleman who has an extensive CAD/PVD history, and is 36 years status post his initial CABG revascularization surgery done in [place] in 1984 and 24 years status post his second CABG surgery in 1996. He has had multiple peripheral vascular procedures including carotid endarterectomy, renal stenting, and has documented SFA occlusion. His last catheterization/ intervention in 2012 demonstrated diffuse disease in the vein graft supplying the marginal vessel and 2 additional Taxus stents inserted commencing ostially and extending to the proximal portion of the recently placed mid prior stent. He has a known occluded graft to the RCA with a mid RCA occlusion with left to right collaterals. He also has subclavian steal. He does have mild hypokinesis inferolaterally in the basal inferior wall with an EF of 45% at that catheterization. He has chronic right bundle branch block.  Previously he had complained of mild chest anginal  symptomatology typically when he starts exercise and particularly after eating. He had potential areas of myocardium which may be responsible for ischemia  Ranexa 500 mg twice a day was added to his medical regimen and subsequently  titrated to 1000 mg twice a day , which resolved his symptomatology.  Because of issues with atrial fibrillation and a trial of amiodarone therapy in the past, Ranexa dose was reduced due to concomitant medication.  He has undergone EP evaluation for other options.  In the past he did not tolerate higher dose amiodarone which resulted in unsteadiness and balance issues.  He was taken off amiodarone.  He is now in permanent atrial fibrillation.  Since I last saw him he had fractured his right hip requiring hospitalization.  In addition, he was found to have chronic mesenteric ischemia and underwent successful  stenting of both the celiac and superior mesenteric arteries by Dr. Ruta Hinds.  His blood pressure today is stable on his current regimen but he has been experiencing progressive lower extremity edema necessitating doubling of his torsemide dose to 20 mg twice a day and recent initiation of metolazone by Dr. Reesa Chew.  His metolazone dose may ultimately be able to be reduced to every third day or twice a week depending upon his response.  He will need to have follow-up laboratory and he will be seeing Dr. Reesa Chew in follow-up.  His blood pressure today is stable on his current therapy and he also is on diltiazem 120 mg daily.  He is not having any anginal symptoms and continues to be on Ranexa.  He is on Zetia and lovastatin for hyperlipidemia with target LDL less than 70.  He continues to be on sucralfate and he is esomeprazole and is followed by Dr. Henrene Pastor.  I discussed wearing compression socks with a least 20 to 30 mm support.  I will see him in 3 months for reevaluation   Time spent: 30 minutes Troy Sine, MD, [facility]  09/09/2018 4:09 PM

## 2018-09-03 NOTE — Patient Instructions (Signed)
Medication Instructions:  The current medical regimen is effective;  continue present plan and medications.  If you need a refill on your cardiac medications before your next appointment, please call your pharmacy.   Follow-Up: At [facility], you and your health needs are our priority.  As part of our continuing mission to provide you with exceptional heart care, we have created designated Provider Care Teams.  These Care Teams include your primary Cardiologist (physician) and Advanced Practice Providers (APPs -  Physician Assistants and Nurse Practitioners) who all work together to provide you with the care you need, when you need it. You will need a follow up appointment in 3 months.  You may see Thomas Kelly, MD or one of the following Advanced Practice Providers on your designated Care Team: Hao Meng, PA-C . Angela Duke, PA-C    

## 2018-09-04 ENCOUNTER — Telehealth: Payer: Self-pay

## 2018-09-05 ENCOUNTER — Encounter: Payer: Self-pay | Admitting: Internal Medicine

## 2018-09-05 ENCOUNTER — Ambulatory Visit (INDEPENDENT_AMBULATORY_CARE_PROVIDER_SITE_OTHER): Payer: Medicare Other | Admitting: Internal Medicine

## 2018-09-05 VITALS — Ht 65.0 in | Wt 137.0 lb

## 2018-09-05 DIAGNOSIS — R4702 Dysphasia: Secondary | ICD-10-CM | POA: Diagnosis not present

## 2018-09-05 DIAGNOSIS — K2100 Gastro-esophageal reflux disease with esophagitis, without bleeding: Secondary | ICD-10-CM

## 2018-09-05 MED ORDER — ESOMEPRAZOLE MAGNESIUM 40 MG PO CPDR: 40.0000 mg | DELAYED_RELEASE_CAPSULE | Freq: Two times a day (BID) | ORAL | 3 refills | Status: DC

## 2018-09-05 MED ORDER — SENNOSIDES-DOCUSATE SODIUM 8.6-50 MG PO TABS: 1.0000 | ORAL_TABLET | Freq: Every day | ORAL | 6 refills | Status: AC

## 2018-09-05 MED ORDER — SUCRALFATE 1 GM/10ML PO SUSP: 1.0000 g | Freq: Three times a day (TID) | ORAL | 11 refills | Status: AC

## 2018-09-05 NOTE — Progress Notes (Signed)
HISTORY OF PRESENT ILLNESS:  [name] is a 73 y.o. male with extremely complicated past medical history as listed below.  He is on chronic oral anticoagulation therapy.  He presents to my office today regarding hospital follow-up after having had exacerbation of reflux symptoms and significant issues with dysphasia.  He has had a difficult time this year including hip surgery, pneumonia, and mesenteric ischemia for which he underwent stenting of the SMA and celiac arteries Jul 16, 2018.  Hospitalized shortly after that with nausea, vomiting, and dysphasia.  Upper endoscopy by Dr. Tarri Glenn Jul 26, 2018 revealed ulcerative esophagitis.  Biopsies revealed inflamed squamous mucosa.  No dilation performed.  He was resumed on twice daily PPI and started Carafate 4 times daily.  Patient tells me that since he has reinitiated his medical therapy he has been doing well.  He reports no active reflux symptoms, burning, nausea, or dysphasia.  He is pleased.  GI review of systems is otherwise remarkable for chronic constipation.  He states that he was prescribed senna plus which is worked nicely.  He does request a refill of this medication if possible.  He denies rectal bleeding.Marland Kitchen  His last colonoscopy for adenomatous colon polyps 2016.  He is due for follow-up sometime next year.  REVIEW OF SYSTEMS:  All non-GI ROS negative unless otherwise stated in the HPI except for fatigue, back pain  Past Medical History:  Diagnosis Date  . Anemia   . Angina   . Atrial fibrillation ([place])   . Blood transfusion   . Bronchitis   . Bulging discs    "8 of them; thoracic, lumbar, sacral area"  . Chronic back pain   . Colon polyps   . Complication of anesthesia   . Coronary artery disease 01/24/11   Successful PCI long segmental stensois  vein graft to the CX marginal vessel-graft previously stented prox. 3.0x59mm TAXUS stent mid seg 3.0x73mm TAXUS stent now tandem stens of 3.0x5mm & 3.0x21mm placed with the seg. 50,80 &  99% stenosis reduced to 0%  . DDD (degenerative disc disease)   . Diastolic heart failure ([place])   . Dysrhythmia    "PAC's and PVC's"  . Esophageal dilatation    "have had it done 7 times; last time ~ 2001"  . Esophageal stricture   . GERD (gastroesophageal reflux disease)   . Headache(784.0)   . Hiatal hernia   . Hyperlipidemia   . Myocardial infarction ([place]) 1991  . Myocardial infarction [facility]) 2005   "had 3 heart attacks this year"  . Paroxysmal atrial fibrillation ([facility])   . Pyloric stenosis   . RBBB   . Renal artery stenosis ([facility])    hhistory of right renal artery stenting and re-intervention for "in-stent restenosis.  . S/P CABG (coronary artery bypass graft) [place]  . Shortness of breath    "when I had the heart attacks"  . Subclavian artery stenosis, left ([place])   . Ulcer    "small; Dr. Henrene Pastor found it 02/2010"    Past Surgical History:  Procedure Laterality Date  . ADENOIDECTOMY     "when I was an infant"  . APPENDECTOMY  1953  . BIOPSY  07/26/2018   Procedure: BIOPSY;  Surgeon: Thornton Park, MD;  Location: [place];  Service: Gastroenterology;;  . CARDIOVERSION N/A 04/17/2017   Procedure: CARDIOVERSION;  Surgeon: Jerline Pain, MD;  Location: [place];  Service: Cardiovascular;  Laterality: N/A;  . CAROTID ENDARTERECTOMY  Right [place] WITH STENT PLACEMENT  01/24/11   "today makes a total of 9 stents"  . CORONARY ARTERY BYPASS GRAFT  1984   CABG X 4  . CORONARY ARTERY BYPASS GRAFT  1996   CABG X 4  . ESOPHAGOGASTRODUODENOSCOPY (EGD) WITH PROPOFOL N/A 07/26/2018   Procedure: ESOPHAGOGASTRODUODENOSCOPY (EGD) WITH PROPOFOL;  Surgeon: Thornton Park, MD;  Location: [place];  Service: Gastroenterology;  Laterality: N/A;  . LEFT HEART CATHETERIZATION WITH CORONARY/GRAFT ANGIOGRAM N/A 01/24/2011   Procedure: LEFT HEART CATHETERIZATION WITH Beatrix Fetters;  Surgeon: Troy Sine, MD;  Location: [facility] CATH LAB;  Service: Cardiovascular;  Laterality: N/A;  . PERIPHERAL VASCULAR INTERVENTION  07/16/2018   Procedure: PERIPHERAL VASCULAR INTERVENTION;  Surgeon: Elam Dutch, MD;  Location: [place] CV LAB;  Service: Cardiovascular;;  SMA and Celiac  . pylondial cyst removal    . RETINAL DETACHMENT SURGERY  04/2007   right  . TONSILLECTOMY  1972  . VISCERAL ANGIOGRAPHY N/A 07/16/2018   Procedure: VISCERAL ANGIOGRAPHY;  Surgeon: Elam Dutch, MD;  Location: [place] CV LAB;  Service: Cardiovascular;  Laterality: N/A;    Social History [name]  reports that he quit smoking about 13 years ago. His smoking use included cigarettes. He has a 38.00 pack-year smoking history. He has never used smokeless tobacco. He reports that he does not drink alcohol or use drugs.  family history includes AAA (abdominal aortic aneurysm) in his father; Breast cancer in his paternal aunt; Diabetes in his sister; Heart attack in his father; Heart disease in his father; Heart failure in his father; Hypertension in his mother; Stroke in his mother; Ulcers in his father.  Allergies  Allergen Reactions  . Codeine Nausea And Vomiting  . Sulfa Antibiotics Rash  . Sulfonamide Derivatives Rash       PHYSICAL EXAMINATION: Physical exam with telehealth medicine visit  ASSESSMENT:  1.  GERD.  Exacerbation.  I believe that his GERD is exacerbated by his multiple hospitalizations and disruption in his usual apical therapies for his chronic GERD as well as spending more time in the recumbent position.  Upper endoscopy revealed erosive esophagitis and has been successfully treated with regular twice daily PPI therapy with the addition of Carafate.  This point he is having no further dysphasia.  I do not think that he should undergo repeat upper endoscopy at this time.  However should he have recurrent dysphasia, this would be reasonable.  His anticoagulation therapies are an issue that  put him at high risk and that should only be done if necessary.  In the interim he has been urged to chew his food well.  He is also requested refill of his medications. 2.  Functional constipation.  Patient has been doing well with senna.  He requests a refill of that medication.  We will oblige. 3.  History of multiple adenomatous colon polyps.  Due for follow-up next year 4.  History of cecal AVM 5.  Multiple significant medical problems   PLAN:  1.  REFILL Nexium 40 mg twice daily.  1 year of refills.  Medication risks reviewed 2.  REFILL Carafate 4 times daily.  1 year refills.  Take as needed for breakthrough symptoms.  Advised with regards to not taking within 2 hours of other medications 3.  REFILL senna plus with several refills.  Take as needed for constipation 4.  Contact the office for questions or problems 5.  Surveillance colonoscopy  next year admitted medically fit.  He will need to be carefully evaluated given his comorbidities as to the need for surveillance at that time. 6.  Resume general medical care and specialty care with other physicians This telehealth medicine visit was initiated by the patient and consented for by the patient.  He was in my office and I was in my remote office during the encounter.  He understands her may be associated charge for this service which totaled 25 minutes in time.

## 2018-09-05 NOTE — Addendum Note (Signed)
Addended by: Audrea Muscat on: 09/05/2018 01:55 PM   Modules accepted: Orders

## 2018-09-05 NOTE — Patient Instructions (Addendum)
  1.  GERD.  Exacerbation.  I believe that his GERD is exacerbated by his multiple hospitalizations and disruption in his usual apical therapies for his chronic GERD as well as spending more time in the recumbent position.  Upper endoscopy revealed erosive esophagitis and has been successfully treated with regular twice daily PPI therapy with the addition of Carafate.  This point he is having no further dysphasia.  I do not think that he should undergo repeat upper endoscopy at this time.  However should he have recurrent dysphasia, this would be reasonable.  His anticoagulation therapies are an issue that put him at high risk and that should only be done if necessary.  In the interim he has been urged to chew his food well.  He is also requested refill of his medications.  2.  Functional constipation.  Patient has been doing well with senna.  He requests a refill of that medication.  We will oblige.  3.  History of multiple adenomatous colon polyps.  Due for follow-up next year

## 2018-09-05 NOTE — Telephone Encounter (Signed)
Patient has spoken to Dr. Henrene Pastor

## 2018-09-06 ENCOUNTER — Telehealth: Payer: Self-pay

## 2018-09-06 NOTE — Telephone Encounter (Signed)
Called pt to review recent echo results. Patient stated that he had an appointment with his PCP last Friday and metolazone 2.5 mg was added to his med regimen. He is to take metolazone 2.5 mg M-W-F and continue torsemide 20 mg BID. He states his PCP will be monitoring kidney function on a monthly basis. Pt has lost 6 lbs so far. He will continue to wear compression stockings OOB and elevate legs. Result note with this information has been routed to Dr. Gwenlyn Found for review

## 2018-09-09 ENCOUNTER — Encounter: Payer: Self-pay | Admitting: Cardiovascular Disease

## 2018-09-13 ENCOUNTER — Telehealth: Payer: Self-pay

## 2018-09-13 NOTE — Telephone Encounter (Signed)
Pt called and said that he had stents in his abdomen recently and afterwards developed pseudoaneurysm and those were taken care of earlier but he now has a new bulge that concerns him  Appt made for this to be looked at with Vinnie Level.   York Cerise, CMA

## 2018-09-17 NOTE — Telephone Encounter (Signed)
error 

## 2018-09-25 ENCOUNTER — Telehealth (HOSPITAL_COMMUNITY): Payer: Self-pay | Admitting: Rehabilitation

## 2018-09-25 NOTE — Telephone Encounter (Signed)

## 2018-09-26 ENCOUNTER — Ambulatory Visit (INDEPENDENT_AMBULATORY_CARE_PROVIDER_SITE_OTHER): Payer: Self-pay | Admitting: Family

## 2018-09-26 ENCOUNTER — Other Ambulatory Visit: Payer: Self-pay

## 2018-09-26 ENCOUNTER — Encounter: Payer: Self-pay | Admitting: Family

## 2018-09-26 VITALS — BP 113/65 | HR 87 | Temp 97.6°F | Resp 14 | Ht 65.0 in | Wt 132.2 lb

## 2018-09-26 DIAGNOSIS — I6523 Occlusion and stenosis of bilateral carotid arteries: Secondary | ICD-10-CM

## 2018-09-26 DIAGNOSIS — Z87891 Personal history of nicotine dependence: Secondary | ICD-10-CM

## 2018-09-26 DIAGNOSIS — Z9889 Other specified postprocedural states: Secondary | ICD-10-CM

## 2018-09-26 DIAGNOSIS — I771 Stricture of artery: Secondary | ICD-10-CM

## 2018-09-26 DIAGNOSIS — K551 Chronic vascular disorders of intestine: Secondary | ICD-10-CM

## 2018-09-26 NOTE — Progress Notes (Signed)
CC: small hematoma left groin, chronic Mesenteric Ischemia, s/p celiac artery stenting and superior mesenteric artery stenting on 07-16-18  History of Present Illness  [name] is a 73 y.o. ([birth year]) male who is s/p celiac artery stenting and superior mesenteric artery stenting on 07-16-18 by Dr. Oneida Alar for chronic mesenteric ischemia.   He has also had a carotid endarterectomy and redo by Dr. Donnetta Hutching in the remote past.   Dr. Oneida Alar last evaluated pt on 08-23-18. At that time extremities: 2+ femoral pulses bilaterally no obvious hematoma Duplex ultrasound of his mesenteric vessels that day was suboptimal due to acoustic shadowing.  The stents were patent but it was difficult to determine how much stenosis might be present. He was doing well status post celiac and superior mesenteric artery stenting, was asymptomatic. The patient was toifollow-up in 3 months with a CTA of the abdomen and pelvis at that time since we were not able to adequately visualize his stents that day on duplex exam.  Patient was to call sooner if he develops any recurrent abdominal symptoms. Continue his statin and aspirin.  He is on Eliquis for a long-term chronic atrial fibrillation.  Pt returns today with c/o knot in his left groin that he noticed about a week ago, is not enlarging per pt.   He reports fluid in his legs, helped with diuretics.   He does have known superficial femoral artery occlusive disease, but he no longer has claudication symptoms in his legs since he has been exercising so much.  He denies any history of stroke or TIA, specifically he denies a history of amaurosis fugax or monocular blindness,unilateralfacial drooping, hemiplegia, orreceptive or expressive aphasia.   He denies any steal symptoms in either upper extremity, denies cold sensation in either upper extremity, denies pain in either extremity. He denies feeling dizzy, but does feel off balance if he turns quickly; states he  has tinnitus. He has been evaluated by an ENT in [facility] re this.   He also reports that he has known sacral, lumbar, and thoracic "bulging discs", degenerative disc disease, found on CT of his spine, per patient. He also did years of heavy lifting as an EMT. He also reports that he has scoliosis.  Hehad been doingcardiac rehab/wellness program at [facility] days/week since 2005, states this has helped his PAD, he no longer has claudication symptoms in his legs with walking. He stopped cardiac rehab due to chest pain with increased heart rate.  But he states his cardiologist said he could resume but not exercise hard enough to elicit chest pain.  Pt states that his cardiologist is monitoring his aorta, iliac arteries, renal arteries, and checks ABI's regularly.   Pt states last chest pain episode was several days ago, had a cardiac stent placed in 2012; states in 1996 his LIMA was used as a bypass vessel. He had 4 vessel CABG in the 1980's and again in 1996.  He hasparoxysmal atrial fib,takes amiodarone and metoprolol prescribed by Dr. Claiborne Billings; he was being considered for cardioversion by Dr. Curt Bears.   He reports dizziness since about 2015 or 2016 that is stable.  Diabetic: No Tobaccos use: former smoker, quit in 2006  Pt meds include: Statin : Yes ASA: no Other anticoagulants/antiplatelets: Eliquis for atrial fib   Past Medical History:  Diagnosis Date  . Anemia   . Angina   . Atrial fibrillation ([place])   . Blood transfusion   . Bronchitis   . Bulging discs    "  8 of them; thoracic, lumbar, sacral area"  . Chronic back pain   . Colon polyps   . Complication of anesthesia   . Coronary artery disease 01/24/11   Successful PCI long segmental stensois  vein graft to the CX marginal vessel-graft previously stented prox. 3.0x45mm TAXUS stent mid seg 3.0x42mm TAXUS stent now tandem stens of 3.0x43mm & 3.0x67mm placed with the seg. 50,80 & 99% stenosis reduced to  0%  . DDD (degenerative disc disease)   . Diastolic heart failure ([place])   . Dysrhythmia    "PAC's and PVC's"  . Esophageal dilatation    "have had it done 7 times; last time ~ 2001"  . Esophageal stricture   . GERD (gastroesophageal reflux disease)   . Headache(784.0)   . Hiatal hernia   . Hyperlipidemia   . Myocardial infarction ([place]) 1991  . Myocardial infarction [facility]) 2005   "had 3 heart attacks this year"  . Paroxysmal atrial fibrillation ([facility])   . Pyloric stenosis   . RBBB   . Renal artery stenosis ([facility])    hhistory of right renal artery stenting and re-intervention for "in-stent restenosis.  . S/P CABG (coronary artery bypass graft) [place]  . Shortness of breath    "when I had the heart attacks"  . Subclavian artery stenosis, left ([place])   . Ulcer    "small; Dr. Henrene Pastor found it 02/2010"    Social History Social History   Tobacco Use  . Smoking status: Former Smoker    Packs/day: 1.00    Years: 38.00    Pack years: 38.00    Types: Cigarettes    Quit date: 02/09/2005    Years since quitting: 13.6  . Smokeless tobacco: Never Used  . Tobacco comment: "quit smoking cigarrettes 2006"  Substance Use Topics  . Alcohol use: No    Alcohol/week: 0.0 standard drinks  . Drug use: No    Family History Family History  Problem Relation Age of Onset  . Breast cancer Paternal Aunt   . Diabetes Sister   . Ulcers Father   . Heart disease Father   . Heart failure Father   . Heart attack Father   . AAA (abdominal aortic aneurysm) Father   . Stroke Mother   . Hypertension Mother   . Colon cancer Neg Hx     Surgical History Past Surgical History:  Procedure Laterality Date  . ADENOIDECTOMY     "when I was an infant"  . APPENDECTOMY  1953  . BIOPSY  07/26/2018   Procedure: BIOPSY;  Surgeon: Thornton Park, MD;  Location: [place];  Service: Gastroenterology;;  . CARDIOVERSION N/A 04/17/2017   Procedure: CARDIOVERSION;   Surgeon: Jerline Pain, MD;  [facility] ENDOSCOPY;  Service: Cardiovascular;  Laterality: N/A;  . CAROTID ENDARTERECTOMY Right [place]  01/24/11   "today makes a total of 9 stents"  . CORONARY ARTERY BYPASS GRAFT  1984   CABG X 4  . CORONARY ARTERY BYPASS GRAFT  1996   CABG X 4  . ESOPHAGOGASTRODUODENOSCOPY (EGD) WITH PROPOFOL N/A 07/26/2018   Procedure: ESOPHAGOGASTRODUODENOSCOPY (EGD) WITH PROPOFOL;  Surgeon: Thornton Park, MD;  Location: [place];  Service: Gastroenterology;  Laterality: N/A;  . LEFT HEART CATHETERIZATION WITH CORONARY/GRAFT ANGIOGRAM N/A 01/24/2011   Procedure: LEFT HEART CATHETERIZATION WITH Beatrix Fetters;  Surgeon: Troy Sine, MD;  Location[facility] CATH LAB;  Service: Cardiovascular;  Laterality: N/A;  . PERIPHERAL VASCULAR INTERVENTION  07/16/2018   Procedure: PERIPHERAL VASCULAR INTERVENTION;  Surgeon: Elam Dutch, MD;  Location: [place] CV LAB;  Service: Cardiovascular;;  SMA and Celiac  . pylondial cyst removal    . RETINAL DETACHMENT SURGERY  04/2007   right  . TONSILLECTOMY  1972  . VISCERAL ANGIOGRAPHY N/A 07/16/2018   Procedure: VISCERAL ANGIOGRAPHY;  Surgeon: Elam Dutch, MD;  Location: [place] CV LAB;  Service: Cardiovascular;  Laterality: N/A;    Allergies  Allergen Reactions  . Codeine Nausea And Vomiting  . Sulfa Antibiotics Rash  . Sulfonamide Derivatives Rash    Current Outpatient Medications  Medication Sig Dispense Refill  . aspirin EC 81 MG EC tablet Take 1 tablet (81 mg total) by mouth daily. 30 tablet 0  . calcium carbonate (TUMS - DOSED IN MG ELEMENTAL CALCIUM) 500 MG chewable tablet Chew 1 tablet (200 mg of elemental calcium total) by mouth 3 (three) times daily as needed for indigestion or heartburn. 30 tablet 0  . diltiazem (CARDIZEM CD) 120 MG 24 hr capsule Take 1 capsule (120 mg total) by mouth daily. 30 capsule 6  . diltiazem (CARDIZEM) 30 MG tablet  Take 1 tablet (30 mg total) by mouth as needed (for heart rate greater than 120). 120 tablet 0  . dutasteride (AVODART) 0.5 MG capsule Take 0.5 mg by mouth daily.     Marland Kitchen ELIQUIS 5 MG TABS tablet TAKE 1 TABLET BY MOUTH TWICE A DAY (Patient taking differently: Take 5 mg by mouth 2 (two) times daily. ) 180 tablet 3  . esomeprazole (NEXIUM) 40 MG capsule Take 1 capsule (40 mg total) by mouth 2 (two) times daily before a meal. 120 capsule 3  . ezetimibe (ZETIA) 10 MG tablet TAKE 1 TABLET (10 MG TOTAL) BY MOUTH DAILY. (Patient taking differently: Take 10 mg by mouth daily. ) 90 tablet 3  . Ketotifen Fumarate (ALAWAY OP) Place 1 drop into the right eye daily.    Marland Kitchen loratadine (CLARITIN) 10 MG tablet Take 10 mg by mouth daily as needed for allergies, rhinitis or itching.   3  . lovastatin (MEVACOR) 40 MG tablet Take 20 mg by mouth at bedtime.    . Multiple Vitamins-Minerals (MULTIVITAMINS THER. W/MINERALS) TABS Take 1 tablet by mouth daily.      . nitroGLYCERIN (NITROLINGUAL) 0.4 MG/SPRAY spray Place 1 spray under the tongue every 5 (five) minutes x 3 doses as needed for chest pain. 4.9 g 2  . ranolazine (RANEXA) 1000 MG SR tablet Take 500 mg by mouth 2 (two) times daily.    Marland Kitchen senna-docusate (SENOKOT-S) 8.6-50 MG tablet Take 1 tablet by mouth daily. 30 tablet 6  . sucralfate (CARAFATE) 1 GM/10ML suspension Take 10 mLs (1 g total) by mouth 4 (four) times daily -  with meals and at bedtime. 1200 mL 11  . torsemide (DEMADEX) 20 MG tablet Take 1 tablet (20 mg total) by mouth 2 (two) times daily. FOR SWELLING OR WEIGHT GAIN 180 tablet 3  . vitamin E 400 UNIT capsule Take 400 Units by mouth daily.      No current facility-administered medications for this visit.     ROS: see HPI for pertinent positives and negatives    Physical Examination  Vitals:   09/26/18 1039  BP: 113/65  Pulse: 87  Resp: 14  Temp: 97.6 F (36.4 C)  TempSrc: Temporal  SpO2: 97%  Weight: 132 lb 3.2 oz (60 kg)  Height: 5'  5"  (1.651 m)   Body mass index is 22 kg/m.  General: A&O x 3, WDWN, elderly frail appearing male Gait: slow, steady, using walker HEENT: no gross abnormalities  Pulmonary: Sym exp, limited air movement in all fields, no rales, rhonchi, or wheezing. Cardiac: irregular rhythm with controlled rate no detected Murmur.  Vascular: Vessel Right Left  Radial 2+Palpable Not Palpable  Brachial Palpable Palpable  Carotid  with bruit  with bruit  Aorta Not palpable N/A  Femoral 2+Palpable 2+Palpable  Popliteal not palpable not palpable  PT Not Palpable Not Palpable  DP Not Palpable Not Palpable   Gastrointestinal: soft, NTND, -G/R, - HSM, 1 cm x 2 cm hard mass, likely hematoma, at left femoral access site of arteriogram, - CVAT. Musculoskeletal: M/S 3/5 throughout, significant thoracic kyphosis, extremities without ischemic changes, bilateral pitting edema in both lower legs and feet: 1+ right, 2+ left Neurologic: Pain and light touch intact in extremities, Motor exam as listed above. CN 2-12 intact.  Skin: No rash, no cellulitis, no ulcers noted.  Psychiatric: Normal thought content, mood appropriate to clinical situation.     Medical Decision Making  [name] is a 73 y.o. male who is s/p celiac artery stenting and superior mesenteric artery stenting on 07-16-18 by Dr. Oneida Alar for chronic mesenteric ischemia.   He has a small hematoma at his left groin arteriogram access site. He take Eliquis.   Dr. Scot Dock also examined pt; pt area of concer, left groin hard mass measuring 1 cm x 2 cm, is likely a hematoma. Pt states longer pressure time had to be applied to his left groin access site after his May 2020 arteriogram. He takes Eliquis which encourages hematoma formation.   I advised him not to do any heavy lifting, and to be careful walking, continue to use his walker. The hematoma may take several weeks to absorb.   He is scheduled to follow up in August with me re his carotid arteries  and PAD. Dr. Oneida Alar advised  CTA of the abdomen and pelvis in 3 months after his June 2020 visit with him, since we were not able to adequately visualize his celiac and SMA stents that day on duplex exam.    Clemon Chambers, RN, MSN, FNP-C [facility] Office: [phone]  Clinic MD: Scot Dock  09/26/2018, 11:14 AM

## 2018-10-10 ENCOUNTER — Encounter (HOSPITAL_COMMUNITY): Payer: Self-pay | Admitting: General Practice

## 2018-10-10 ENCOUNTER — Observation Stay (HOSPITAL_COMMUNITY): Payer: Medicare Other

## 2018-10-10 ENCOUNTER — Other Ambulatory Visit: Payer: Self-pay

## 2018-10-10 ENCOUNTER — Inpatient Hospital Stay (HOSPITAL_COMMUNITY)
Admission: EM | Admit: 2018-10-10 | Discharge: 2018-10-15 | DRG: 291 | Disposition: A | Discharge status: Home / Self Care | Payer: Medicare Other | Source: Other Acute Inpatient Hospital | Attending: Internal Medicine | Admitting: Internal Medicine

## 2018-10-10 DIAGNOSIS — I480 Paroxysmal atrial fibrillation: Secondary | ICD-10-CM | POA: Diagnosis present

## 2018-10-10 DIAGNOSIS — Z9889 Other specified postprocedural states: Secondary | ICD-10-CM

## 2018-10-10 DIAGNOSIS — E871 Hypo-osmolality and hyponatremia: Secondary | ICD-10-CM | POA: Diagnosis not present

## 2018-10-10 DIAGNOSIS — Z823 Family history of stroke: Secondary | ICD-10-CM | POA: Diagnosis not present

## 2018-10-10 DIAGNOSIS — R0602 Shortness of breath: Secondary | ICD-10-CM

## 2018-10-10 DIAGNOSIS — R0603 Acute respiratory distress: Secondary | ICD-10-CM | POA: Diagnosis present

## 2018-10-10 DIAGNOSIS — K311 Adult hypertrophic pyloric stenosis: Secondary | ICD-10-CM | POA: Diagnosis present

## 2018-10-10 DIAGNOSIS — Z951 Presence of aortocoronary bypass graft: Secondary | ICD-10-CM

## 2018-10-10 DIAGNOSIS — J918 Pleural effusion in other conditions classified elsewhere: Secondary | ICD-10-CM | POA: Diagnosis not present

## 2018-10-10 DIAGNOSIS — I4819 Other persistent atrial fibrillation: Secondary | ICD-10-CM

## 2018-10-10 DIAGNOSIS — K449 Diaphragmatic hernia without obstruction or gangrene: Secondary | ICD-10-CM | POA: Diagnosis not present

## 2018-10-10 DIAGNOSIS — I10 Essential (primary) hypertension: Secondary | ICD-10-CM | POA: Diagnosis not present

## 2018-10-10 DIAGNOSIS — K222 Esophageal obstruction: Secondary | ICD-10-CM | POA: Diagnosis present

## 2018-10-10 DIAGNOSIS — I5033 Acute on chronic diastolic (congestive) heart failure: Secondary | ICD-10-CM | POA: Diagnosis not present

## 2018-10-10 DIAGNOSIS — I130 Hypertensive heart and chronic kidney disease with heart failure and stage 1 through stage 4 chronic kidney disease, or unspecified chronic kidney disease: Principal | ICD-10-CM | POA: Diagnosis present

## 2018-10-10 DIAGNOSIS — D649 Anemia, unspecified: Secondary | ICD-10-CM | POA: Diagnosis present

## 2018-10-10 DIAGNOSIS — J90 Pleural effusion, not elsewhere classified: Secondary | ICD-10-CM | POA: Diagnosis present

## 2018-10-10 DIAGNOSIS — R54 Age-related physical debility: Secondary | ICD-10-CM | POA: Diagnosis present

## 2018-10-10 DIAGNOSIS — N183 Chronic kidney disease, stage 3 (moderate): Secondary | ICD-10-CM | POA: Diagnosis not present

## 2018-10-10 DIAGNOSIS — Z955 Presence of coronary angioplasty implant and graft: Secondary | ICD-10-CM

## 2018-10-10 DIAGNOSIS — K2970 Gastritis, unspecified, without bleeding: Secondary | ICD-10-CM | POA: Diagnosis not present

## 2018-10-10 DIAGNOSIS — I2510 Atherosclerotic heart disease of native coronary artery without angina pectoris: Secondary | ICD-10-CM | POA: Diagnosis not present

## 2018-10-10 DIAGNOSIS — Z8249 Family history of ischemic heart disease and other diseases of the circulatory system: Secondary | ICD-10-CM

## 2018-10-10 DIAGNOSIS — K2210 Ulcer of esophagus without bleeding: Secondary | ICD-10-CM | POA: Diagnosis present

## 2018-10-10 DIAGNOSIS — K208 Other esophagitis without bleeding: Secondary | ICD-10-CM | POA: Diagnosis present

## 2018-10-10 DIAGNOSIS — Z833 Family history of diabetes mellitus: Secondary | ICD-10-CM

## 2018-10-10 DIAGNOSIS — E785 Hyperlipidemia, unspecified: Secondary | ICD-10-CM | POA: Diagnosis not present

## 2018-10-10 DIAGNOSIS — K219 Gastro-esophageal reflux disease without esophagitis: Secondary | ICD-10-CM | POA: Diagnosis present

## 2018-10-10 DIAGNOSIS — E876 Hypokalemia: Secondary | ICD-10-CM | POA: Diagnosis present

## 2018-10-10 DIAGNOSIS — Z7901 Long term (current) use of anticoagulants: Secondary | ICD-10-CM

## 2018-10-10 DIAGNOSIS — K4090 Unilateral inguinal hernia, without obstruction or gangrene, not specified as recurrent: Secondary | ICD-10-CM | POA: Diagnosis not present

## 2018-10-10 DIAGNOSIS — K2990 Gastroduodenitis, unspecified, without bleeding: Secondary | ICD-10-CM

## 2018-10-10 DIAGNOSIS — I252 Old myocardial infarction: Secondary | ICD-10-CM | POA: Diagnosis not present

## 2018-10-10 DIAGNOSIS — I70209 Unspecified atherosclerosis of native arteries of extremities, unspecified extremity: Secondary | ICD-10-CM | POA: Diagnosis present

## 2018-10-10 DIAGNOSIS — G2581 Restless legs syndrome: Secondary | ICD-10-CM | POA: Diagnosis present

## 2018-10-10 DIAGNOSIS — Z20828 Contact with and (suspected) exposure to other viral communicable diseases: Secondary | ICD-10-CM | POA: Diagnosis present

## 2018-10-10 DIAGNOSIS — I4891 Unspecified atrial fibrillation: Secondary | ICD-10-CM | POA: Diagnosis present

## 2018-10-10 DIAGNOSIS — Z87891 Personal history of nicotine dependence: Secondary | ICD-10-CM

## 2018-10-10 DIAGNOSIS — R0989 Other specified symptoms and signs involving the circulatory and respiratory systems: Secondary | ICD-10-CM

## 2018-10-10 DIAGNOSIS — E878 Other disorders of electrolyte and fluid balance, not elsewhere classified: Secondary | ICD-10-CM | POA: Diagnosis present

## 2018-10-10 DIAGNOSIS — N179 Acute kidney failure, unspecified: Secondary | ICD-10-CM | POA: Diagnosis not present

## 2018-10-10 DIAGNOSIS — R188 Other ascites: Secondary | ICD-10-CM | POA: Diagnosis present

## 2018-10-10 DIAGNOSIS — R062 Wheezing: Secondary | ICD-10-CM

## 2018-10-10 LAB — COMPREHENSIVE METABOLIC PANEL
ALT: 31 U/L (ref 0–44)
AST: 50 U/L — ABNORMAL HIGH (ref 15–41)
Albumin: 2.9 g/dL — ABNORMAL LOW (ref 3.5–5.0)
Alkaline Phosphatase: 112 U/L (ref 38–126)
Anion gap: 15 (ref 5–15)
BUN: 44 mg/dL — ABNORMAL HIGH (ref 8–23)
CO2: 26 mmol/L (ref 22–32)
Calcium: 8.2 mg/dL — ABNORMAL LOW (ref 8.9–10.3)
Chloride: 87 mmol/L — ABNORMAL LOW (ref 98–111)
Creatinine, Ser: 1.93 mg/dL — ABNORMAL HIGH (ref 0.61–1.24)
GFR calc Af Amer: 39 mL/min — ABNORMAL LOW (ref >60)
GFR calc non Af Amer: 33 mL/min — ABNORMAL LOW (ref >60)
Glucose, Bld: 145 mg/dL — ABNORMAL HIGH (ref 70–99)
Potassium: 2.4 mmol/L — CL (ref 3.5–5.1)
Sodium: 128 mmol/L — ABNORMAL LOW (ref 135–145)
Total Bilirubin: 0.8 mg/dL (ref 0.3–1.2)
Total Protein: 5.7 g/dL — ABNORMAL LOW (ref 6.5–8.1)

## 2018-10-10 LAB — CBC
HCT: 20.7 % — ABNORMAL LOW (ref 39.0–52.0)
Hemoglobin: 6.7 g/dL — CL (ref 13.0–17.0)
MCH: 27.8 pg (ref 26.0–34.0)
MCHC: 32.4 g/dL (ref 30.0–36.0)
MCV: 85.9 fL (ref 80.0–100.0)
Platelets: 168 10*3/uL (ref 150–400)
RBC: 2.41 MIL/uL — ABNORMAL LOW (ref 4.22–5.81)
RDW: 15.9 % — ABNORMAL HIGH (ref 11.5–15.5)
WBC: 6.5 10*3/uL (ref 4.0–10.5)
nRBC: 0 % (ref 0.0–0.2)

## 2018-10-10 LAB — MAGNESIUM: Magnesium: 2.1 mg/dL (ref 1.7–2.4)

## 2018-10-10 LAB — PREPARE RBC (CROSSMATCH)

## 2018-10-10 LAB — SARS CORONAVIRUS 2 BY RT PCR (HOSPITAL ORDER, PERFORMED IN ~~LOC~~ HOSPITAL LAB): SARS Coronavirus 2: NEGATIVE

## 2018-10-10 MED ORDER — ACETAMINOPHEN 650 MG RE SUPP: 650.0000 mg | Freq: Four times a day (QID) | RECTAL | Status: DC | PRN

## 2018-10-10 MED ORDER — PANTOPRAZOLE SODIUM 40 MG IV SOLR: 40.0000 mg | Freq: Two times a day (BID) | INTRAVENOUS | Status: DC

## 2018-10-10 MED ORDER — ACETAMINOPHEN 325 MG PO TABS: 650.0000 mg | ORAL_TABLET | Freq: Four times a day (QID) | ORAL | Status: DC | PRN

## 2018-10-10 MED ORDER — CALCIUM CARBONATE ANTACID 500 MG PO CHEW: 1.0000 | CHEWABLE_TABLET | Freq: Three times a day (TID) | ORAL | Status: DC | PRN

## 2018-10-10 MED ORDER — SODIUM CHLORIDE 0.9 % IV SOLN
8.0000 mg/h | INTRAVENOUS | Status: DC
  Administered 2018-10-10: 8 mg/h via INTRAVENOUS
  Filled 2018-10-10: qty 80

## 2018-10-10 MED ORDER — SODIUM CHLORIDE 0.9 % IV SOLN
80.0000 mg | Freq: Once | INTRAVENOUS | Status: DC
  Filled 2018-10-10: qty 80

## 2018-10-10 MED ORDER — EZETIMIBE 10 MG PO TABS
10.0000 mg | ORAL_TABLET | Freq: Every day | ORAL | Status: DC
  Administered 2018-10-10 – 2018-10-15 (×5): 10 mg via ORAL
  Filled 2018-10-10 (×5): qty 1

## 2018-10-10 MED ORDER — SODIUM CHLORIDE 0.9 % IV SOLN
INTRAVENOUS | Status: DC | PRN
  Administered 2018-10-10: 250 mL via INTRAVENOUS

## 2018-10-10 MED ORDER — SUCRALFATE 1 GM/10ML PO SUSP
1.0000 g | Freq: Four times a day (QID) | ORAL | Status: DC
  Administered 2018-10-10 – 2018-10-15 (×19): 1 g via ORAL
  Filled 2018-10-10 (×17): qty 10

## 2018-10-10 MED ORDER — POTASSIUM CHLORIDE 10 MEQ/100ML IV SOLN
10.0000 meq | INTRAVENOUS | Status: AC
  Administered 2018-10-10 (×4): 10 meq via INTRAVENOUS
  Filled 2018-10-10 (×4): qty 100

## 2018-10-10 MED ORDER — PRAVASTATIN SODIUM 40 MG PO TABS
40.0000 mg | ORAL_TABLET | Freq: Every day | ORAL | Status: DC
  Administered 2018-10-10 – 2018-10-13 (×4): 40 mg via ORAL
  Filled 2018-10-10 (×4): qty 1

## 2018-10-10 MED ORDER — FUROSEMIDE 10 MG/ML IJ SOLN
20.0000 mg | Freq: Once | INTRAMUSCULAR | Status: AC
  Administered 2018-10-10: 20 mg via INTRAVENOUS
  Filled 2018-10-10: qty 2

## 2018-10-10 MED ORDER — ONDANSETRON HCL 4 MG/2ML IJ SOLN: 4.0000 mg | Freq: Four times a day (QID) | INTRAMUSCULAR | Status: DC | PRN

## 2018-10-10 MED ORDER — PANTOPRAZOLE SODIUM 40 MG PO TBEC
40.0000 mg | DELAYED_RELEASE_TABLET | Freq: Two times a day (BID) | ORAL | Status: DC
  Administered 2018-10-10 – 2018-10-15 (×10): 40 mg via ORAL
  Filled 2018-10-10 (×11): qty 1

## 2018-10-10 MED ORDER — DUTASTERIDE 0.5 MG PO CAPS
0.5000 mg | ORAL_CAPSULE | Freq: Every day | ORAL | Status: DC
  Administered 2018-10-10 – 2018-10-15 (×5): 0.5 mg via ORAL
  Filled 2018-10-10 (×7): qty 1

## 2018-10-10 MED ORDER — RANOLAZINE ER 500 MG PO TB12
500.0000 mg | ORAL_TABLET | Freq: Two times a day (BID) | ORAL | Status: DC
  Administered 2018-10-10 – 2018-10-15 (×10): 500 mg via ORAL
  Filled 2018-10-10 (×10): qty 1

## 2018-10-10 MED ORDER — VITAMIN E 180 MG (400 UNIT) PO CAPS
400.0000 [IU] | ORAL_CAPSULE | Freq: Every day | ORAL | Status: DC
  Administered 2018-10-10 – 2018-10-15 (×5): 400 [IU] via ORAL
  Filled 2018-10-10 (×6): qty 1

## 2018-10-10 MED ORDER — DILTIAZEM HCL ER COATED BEADS 120 MG PO CP24
120.0000 mg | ORAL_CAPSULE | Freq: Every day | ORAL | Status: DC
  Administered 2018-10-10 – 2018-10-15 (×5): 120 mg via ORAL
  Filled 2018-10-10 (×5): qty 1

## 2018-10-10 MED ORDER — SODIUM CHLORIDE 0.9% IV SOLUTION
Freq: Once | INTRAVENOUS | Status: AC
  Administered 2018-10-10: 05:00:00 via INTRAVENOUS

## 2018-10-10 MED ORDER — ONDANSETRON HCL 4 MG PO TABS: 4.0000 mg | ORAL_TABLET | Freq: Four times a day (QID) | ORAL | Status: DC | PRN

## 2018-10-10 MED ORDER — FUROSEMIDE 10 MG/ML IJ SOLN: 40.0000 mg | Freq: Every day | INTRAMUSCULAR | Status: DC

## 2018-10-10 MED ORDER — FUROSEMIDE 10 MG/ML IJ SOLN: 20.0000 mg | Freq: Every day | INTRAMUSCULAR | Status: DC

## 2018-10-10 NOTE — Progress Notes (Signed)
PROGRESS NOTE    [name]  HWY:[id number] DOB: 1945-10-13 DOA: 10/10/2018 PCP: Garwin Brothers, MD   Brief Narrative:  HPI On 10/10/2018 by Dr. Jennette Kettle [name] is a 73 y.o. male with medical history significant of CAD s/p CABG and stents, A.Fib on Eliquis.  Esophageal strictures requiring dilation multiple times.  Chronic mesenteric ischemic s/p stenting back in May.  Also Anemia in May with finding of erosive esophagitis and esophageal ulcer that wasn't bleeding at time of endoscopy.  Patient was discharged on PPIs as well as Carafate through July.  Patient has had 2-3 days of generalized weakness, exercise intolerance.  Patient saw his PCP yesterday and had labs drawn.  His PCP called him up today and told him to "get in to the ED for 2 units of blood" he reports.  No CP, fever, sick contacts.  Denies any melena, hematochezia, emesis.  NO abdominal pain. Assessment & Plan   Admitted earlier today by Dr. Jennette Kettle.  See H&P for details.  Symptomatic anemia/GI bleed -Hemoglobin down to 6.7, currently transfusing 2 units PRBC -Patient recently had EGD on 07/26/2018: Nonbleeding esophageal ulcer, biopsied.  Benign-appearing esophageal stenosis.  Small hiatal hernia.  Gastric stenosis was found at the pylorus. -Biopsy negative for malignancy and Candida. -Gastroenterology consulted and appreciated  Acute kidney injury -Creatinine on admission 1.93.  Baseline approximately 0.9 -Likely secondary to the above -Will continue to monitor BMP  Paroxysmal atrial fibrillation -Eliquis held -Continue Cardizem  Essential hypertension -Continue Cardizem  Hypokalemia -Potassium 2.4 continue to replace and monitor BMP -Will obtain Magnesium  DVT Prophylaxis  SCDs  Code Status: Full  Family Communication: None at bedside  Disposition Plan: Currently in observation. Home when stable  Consultants Gastroenterology   Procedures  None  Antibiotics   Anti-infectives  (From admission, onward)   None      Subjective:   [place] seen and examined today.  Patient feeling very tired this morning and weak.  Denies current chest pain or shortness of breath.  That he has exercise intolerance when he is up and about.  Denies current abdominal pain, nausea or vomiting, headache.   Objective:   Vitals:   10/10/18 0351 10/10/18 0853  BP: (!) 100/58 117/71  Pulse: 78 82  Resp: 18 20  Temp: (!) 97.3 F (36.3 C)   TempSrc: Oral   SpO2: 95% 94%  Weight: 62.6 kg   Height: 5\' 5"  (1.651 m)     Intake/Output Summary (Last 24 hours) at 10/10/2018 0923 Last data filed at 10/10/2018 0846 Gross per 24 hour  Intake 1161.36 ml  Output 450 ml  Net 711.36 ml   Filed Weights   10/10/18 0351  Weight: 62.6 kg    No exam- admitted earlier today.    Data Reviewed: I have personally reviewed following labs and imaging studies  CBC: Recent Labs  Lab 10/10/18 0528  WBC 6.5  HGB 6.7*  HCT 20.7*  MCV 85.9  PLT 211   Basic Metabolic Panel: Recent Labs  Lab 10/10/18 0528  NA 128*  K 2.4*  CL 87*  CO2 26  GLUCOSE 145*  BUN 44*  CREATININE 1.93*  CALCIUM 8.2*   GFR: Estimated Creatinine Clearance: 29.2 mL/min (A) (by C-G formula based on SCr of 1.93 mg/dL (H)). Liver Function Tests: Recent Labs  Lab 10/10/18 0528  AST 50*  ALT 31  ALKPHOS 112  BILITOT 0.8  PROT 5.7*  ALBUMIN 2.9*   No results for input(s):  LIPASE, AMYLASE in the last 168 hours. No results for input(s): AMMONIA in the last 168 hours. Coagulation Profile: No results for input(s): INR, PROTIME in the last 168 hours. Cardiac Enzymes: No results for input(s): CKTOTAL, CKMB, CKMBINDEX, TROPONINI in the last 168 hours. BNP (last 3 results) No results for input(s): PROBNP in the last 8760 hours. HbA1C: No results for input(s): HGBA1C in the last 72 hours. CBG: No results for input(s): GLUCAP in the last 168 hours. Lipid Profile: No results for input(s): CHOL, HDL, LDLCALC,  TRIG, CHOLHDL, LDLDIRECT in the last 72 hours. Thyroid Function Tests: No results for input(s): TSH, T4TOTAL, FREET4, T3FREE, THYROIDAB in the last 72 hours. Anemia Panel: No results for input(s): VITAMINB12, FOLATE, FERRITIN, TIBC, IRON, RETICCTPCT in the last 72 hours. Urine analysis:    Component Value Date/Time   COLORURINE YELLOW 07/24/2018 0108   APPEARANCEUR CLEAR 07/24/2018 0108   LABSPEC 1.017 07/24/2018 0108   PHURINE 6.0 07/24/2018 0108   GLUCOSEU NEGATIVE 07/24/2018 0108   HGBUR NEGATIVE 07/24/2018 0108   BILIRUBINUR NEGATIVE 07/24/2018 0108   KETONESUR NEGATIVE 07/24/2018 0108   PROTEINUR 30 (A) 07/24/2018 0108   NITRITE NEGATIVE 07/24/2018 0108   LEUKOCYTESUR NEGATIVE 07/24/2018 0108   Sepsis Labs: @LABRCNTIP (procalcitonin:4,lacticidven:4)  ) Recent Results (from the past 240 hour(s))  SARS Coronavirus 2 [facility] order, Performed in [place] hospital lab)     Status: None   Collection Time: 10/10/18  4:09 AM  Result Value Ref Range Status   SARS Coronavirus 2 NEGATIVE NEGATIVE Final    Comment: (NOTE) If result is NEGATIVE SARS-CoV-2 target nucleic acids are NOT DETECTED. The SARS-CoV-2 RNA is generally detectable in upper and lower  respiratory specimens during the acute phase of infection. The lowest  concentration of SARS-CoV-2 viral copies this assay can detect is 250  copies / mL. A negative result does not preclude SARS-CoV-2 infection  and should not be used as the sole basis for treatment or other  patient management decisions.  A negative result may occur with  improper specimen collection / handling, submission of specimen other  than nasopharyngeal swab, presence of viral mutation(s) within the  areas targeted by this assay, and inadequate number of viral copies  (<250 copies / mL). A negative result must be combined with clinical  observations, patient history, and epidemiological information. If result is POSITIVE SARS-CoV-2 target nucleic  acids are DETECTED. The SARS-CoV-2 RNA is generally detectable in upper and lower  respiratory specimens dur ing the acute phase of infection.  Positive  results are indicative of active infection with SARS-CoV-2.  Clinical  correlation with patient history and other diagnostic information is  necessary to determine patient infection status.  Positive results do  not rule out bacterial infection or co-infection with other viruses. If result is PRESUMPTIVE POSTIVE SARS-CoV-2 nucleic acids MAY BE PRESENT.   A presumptive positive result was obtained on the submitted specimen  and confirmed on repeat testing.  While 2019 novel coronavirus  (SARS-CoV-2) nucleic acids may be present in the submitted sample  additional confirmatory testing may be necessary for epidemiological  and / or clinical management purposes  to differentiate between  SARS-CoV-2 and other Sarbecovirus currently known to infect humans.  If clinically indicated additional testing with an alternate test  methodology [id number]) is advised. The SARS-CoV-2 RNA is generally  detectable in upper and lower respiratory sp ecimens during the acute  phase of infection. The expected result is Negative. Fact Sheet for Patients:  StrictlyIdeas.no Fact Sheet  for Healthcare Providers: BankingDealers.co.za This test is not yet approved or cleared by the [place] and has been authorized for detection and/or diagnosis of SARS-CoV-2 by FDA under an Emergency Use Authorization (EUA).  This EUA will remain in effect (meaning this test can be used) for the duration of the COVID-19 declaration under Section 564(b)(1) of the Act, 21 U.S.C. section 360bbb-3(b)(1), unless the authorization is terminated or revoked sooner. Performed at [facility], [place] [address] [place], [place] [address]       Radiology Studies: No results found.   Scheduled Meds: . diltiazem  120 mg Oral  Daily  . dutasteride  0.5 mg Oral Daily  . ezetimibe  10 mg Oral Daily  . [START ON 10/13/2018] pantoprazole  40 mg Intravenous Q12H  . pravastatin  40 mg Oral q1800  . ranolazine  500 mg Oral BID  . vitamin E  400 Units Oral Daily   Continuous Infusions: . sodium chloride    . pantoprozole (PROTONIX) infusion 8 mg/hr (10/10/18 0604)  . potassium chloride 10 mEq (10/10/18 0846)     LOS: 0 days   Time Spent in minutes   30 minutes  Neyah Ellerman D.O. on 10/10/2018 at 9:23 AM  Between 7am to 7pm - Please see pager noted on [email or web address]  After 7pm go to [email or web address]  And look for the night coverage person covering for me after hours  [facility] Office  [phone]

## 2018-10-10 NOTE — Progress Notes (Signed)
Lab called with critical hgb-6.7 and potassium 2.4. MD aware of low values. Potassium being replaced now and awaiting type and screen for blood transfusion.

## 2018-10-10 NOTE — Progress Notes (Signed)
Paged MD about pt c/p sob, crackles and diminished in bases, pt has had a lot of fluid that included 2 bottles of water for contrast, 2liuqid trays and iv k infusion, protonic iv infusion, pt sats 93-94% , 2l/[place] applied, blood bank was called and they said blood arrived and they are working on crossmatch now

## 2018-10-10 NOTE — Consult Note (Signed)
[facility] Consult: 8:59 AM 10/10/2018  LOS: 0 days    Referring Provider: Dr Ree Kida  Primary Care Physician:  Garwin Brothers, MD Primary Gastroenterologist:  Dr. Henrene Pastor     Reason for Consultation: Hematemesis.  Anemia.   HPI: [name] is a 73 y.o. male.HxPVD.CAD,MIs. S/p CABG 1984, redo CABG 1996.  NSTEMI early 07/2018, attributed to anemia related demand ischemia. Multiple PCI's. On chronic Eliquis for atrial fibrillation. Anemia. Takes 81 ASA, Eliquis, BID Nexium, previously took Plavix. S/p CEA remotely.  07/07/2018 ORIF right hip fracture. 07/16/18 stenting of SMA and celiac for severe SMA stenosis associated with abdominal pain and fever.  Developed left groin pseudoaneurysm post procedure. GERD,erosive esophagitis, peptic ulcer, peptic stricture,esophageal strictures, previous esophageal dilatations.  02/2010 EGD.Evaluation abdominal pain. Benign appearing distal esophageal stricture. Small pyloric channel ulcer. 02/2011 EGD. For follow-up of gastric ulcer. Distal esophageal stricture. Tiny pyloric ulcer, overall improved appearance. Pyloric stenosis. Hiatal hernia. 2/2016Colonoscopy.Adenoma surveillance,adenomas found 2011. 3 polyps (Tubular adenoms without HGD)removed. 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 Patient was okay to resume Plavix and recommended iron sulfate 325 mg daily to reduce risk of anemia. Plan repeat colonoscopy 04/2019.  07/23/2018 on hospital admission for dysphagia, weakness, lethargy. 07/26/2018 EGD.  Biopsy of nonbleeding esophageal ulcer.  Small HH.  Benign esophageal stenosis.  Stenosis at pylorus.  Duodenum normal.  The esophagus was not dilated due to the presence of the ulcer. During that hospitalization was transfused 1 unit PRBC  for normocytic anemia. He was to continue Nexium 40 mg twice daily, Carafate QID at discharge  09/05/2018 telehealth visit with Dr. Scarlette Shorts.  Did not complain of any reflux symptoms and dysphagia had resolved.  He requested medication for chronic constipation, and was provided with a prescription for senna as well as refills of Nexium 40 BID, Carafate QID.  No EGD planned.    Vascular ultrasound 08/23/2018 showed normal celiac artery findings.  70 to 99% stenosis at SMA origin.  Poor visualization of celiac and SMA stents due to shadowing. Recent symptoms include some dizziness and tendency to feel off balance with quick movements, tinnitus.  Evaluated by ENT in [facility] for this.  On 7/22 complained to vascular surgical nurse practitioner of bulging in his right groin beginning about a week prior.  NP noticed small hematoma in the left groin.  He was advised to avoid heavy lifting and reassured that the hematoma would eventually reabsorb.  Began experiencing weakness and exercise intolerance about 3 days ago.  Seen by PMP on 8/4.  Labs revealed anemia.  Patient advised to proceed to the ED for transfusion.  Hgb 6.7 in the ED.  MCV 85.  Platelets in the 160s.  Transfusion challenged by presence of antibodies in the blood. Additional lab abnormalities include potassium of 2.4, sodium 128.  AKI.  AST slightly elevated at 50, otherwise normal LFTs. Covid 19 negative.    Patient denies black stools, bloody stools, nausea, vomiting, anorexia, abdominal pain, chest pain.  He does endorse periodic spells of tachycardia with exertion along with  reduced stamina and fatigue for the last few days.  The size of the hematoma in his right groin has not increased or decreased and is not painful.  No excessive bruising but does have a purpura on his arms.  No unusual bleeding from his nose, mouth or skin.    Past Medical History:  Diagnosis Date  . Anemia   . Angina   . Atrial fibrillation ([place])   . Blood  transfusion   . Bronchitis   . Bulging discs    "8 of them; thoracic, lumbar, sacral area"  . Chronic back pain   . Colon polyps   . Complication of anesthesia   . Coronary artery disease 01/24/11   Successful PCI long segmental stensois  vein graft to the CX marginal vessel-graft previously stented prox. 3.0x30mm TAXUS stent mid seg 3.0x41mm TAXUS stent now tandem stens of 3.0x25mm & 3.0x30mm placed with the seg. 50,80 & 99% stenosis reduced to 0%  . DDD (degenerative disc disease)   . Diastolic heart failure ([place])   . Dysrhythmia    "PAC's and PVC's"  . Esophageal dilatation    "have had it done 7 times; last time ~ 2001"  . Esophageal stricture   . GERD (gastroesophageal reflux disease)   . Headache(784.0)   . Hiatal hernia   . Hyperlipidemia   . Myocardial infarction ([place]) 1991  . Myocardial infarction [facility]) 2005   "had 3 heart attacks this year"  . Paroxysmal atrial fibrillation (H[facility])   . Pyloric stenosis   . RBBB   . Renal artery stenosis (H[facility])    hhistory of right renal artery stenting and re-intervention for "in-stent restenosis.  . S/P CABG (coronary artery bypass graft) [place]  . Shortness of breath    "when I had the heart attacks"  . Subclavian artery stenosis, left ([place])   . Ulcer    "small; Dr. Henrene Pastor found it 02/2010"    Past Surgical History:  Procedure Laterality Date  . ADENOIDECTOMY     "when I was an infant"  . APPENDECTOMY  1953  . BIOPSY  07/26/2018   Procedure: BIOPSY;  Surgeon: Thornton Park, MD;  Location: [place];  Service: Gastroenterology;;  . CARDIOVERSION N/A 04/17/2017   Procedure: CARDIOVERSION;  Surgeon: Jerline Pain, MD;  Location: [facility] ENDOSCOPY;  Service: Cardiovascular;  Laterality: N/A;  . CAROTID ENDARTERECTOMY Right [place]  01/24/11   "today makes a total of 9 stents"  . CORONARY ARTERY BYPASS GRAFT  1984   CABG X 4  . CORONARY ARTERY  BYPASS GRAFT  1996   CABG X 4  . ESOPHAGOGASTRODUODENOSCOPY (EGD) WITH PROPOFOL N/A 07/26/2018   Procedure: ESOPHAGOGASTRODUODENOSCOPY (EGD) WITH PROPOFOL;  Surgeon: Thornton Park, MD;  Location: [place];  Service: Gastroenterology;  Laterality: N/A;  . LEFT HEART CATHETERIZATION WITH CORONARY/GRAFT ANGIOGRAM N/A 01/24/2011   Procedure: LEFT HEART CATHETERIZATION WITH Beatrix Fetters;  Surgeon: Troy Sine, MD;  Location: [facility] CATH LAB;  Service: Cardiovascular;  Laterality: N/A;  . PERIPHERAL VASCULAR INTERVENTION  07/16/2018   Procedure: PERIPHERAL VASCULAR INTERVENTION;  Surgeon: Elam Dutch, MD;  Location: [place] CV LAB;  Service: Cardiovascular;;  SMA and Celiac  . pylondial cyst removal    . RETINAL DETACHMENT SURGERY  04/2007   right  . TONSILLECTOMY  1972  . VISCERAL ANGIOGRAPHY N/A 07/16/2018   Procedure: VISCERAL ANGIOGRAPHY;  Surgeon: Elam Dutch,  MD;  Location: [place] CV LAB;  Service: Cardiovascular;  Laterality: N/A;    Prior to Admission medications   Medication Sig Start Date End Date Taking? Authorizing Provider  aspirin EC 81 MG EC tablet Take 1 tablet (81 mg total) by mouth daily. 07/29/18  Yes Kayleen Memos, DO  calcium carbonate (TUMS - DOSED IN MG ELEMENTAL CALCIUM) 500 MG chewable tablet Chew 1 tablet (200 mg of elemental calcium total) by mouth 3 (three) times daily as needed for indigestion or heartburn. 07/29/18  Yes Kayleen Memos, DO  diltiazem (CARDIZEM CD) 120 MG 24 hr capsule Take 1 capsule (120 mg total) by mouth daily. 08/02/18  Yes Almyra Deforest, PA  diltiazem (CARDIZEM) 30 MG tablet Take 1 tablet (30 mg total) by mouth as needed (for heart rate greater than 120). 08/02/18  Yes Almyra Deforest, PA  dutasteride (AVODART) 0.5 MG capsule Take 0.5 mg by mouth daily.  03/22/14  Yes [provider]  ELIQUIS 5 MG TABS tablet TAKE 1 TABLET BY MOUTH TWICE A DAY Patient taking differently: Take 5 mg by mouth 2 (two) times daily.  05/14/18   Yes Camnitz, Will Hassell Done, MD  esomeprazole (NEXIUM) 40 MG capsule Take 1 capsule (40 mg total) by mouth 2 (two) times daily before a meal. 09/05/18 11/04/18 Yes Irene Shipper, MD  ezetimibe (ZETIA) 10 MG tablet TAKE 1 TABLET (10 MG TOTAL) BY MOUTH DAILY. Patient taking differently: Take 10 mg by mouth daily.  03/05/18  Yes Troy Sine, MD  Gabapentin Enacarbil (HORIZANT) 600 MG TBCR Take by mouth at bedtime. 10/08/18  Yes [provider]  Ketotifen Fumarate (ALAWAY OP) Place 1 drop into the right eye daily.   Yes [provider]  loratadine (CLARITIN) 10 MG tablet Take 10 mg by mouth daily as needed for allergies, rhinitis or itching.  12/12/13  Yes [provider]  lovastatin (MEVACOR) 40 MG tablet Take 20 mg by mouth at bedtime.   Yes [provider]  Multiple Vitamins-Minerals (MULTIVITAMINS THER. W/MINERALS) TABS Take 1 tablet by mouth daily.     Yes [provider]  nitroGLYCERIN (NITROLINGUAL) 0.4 MG/SPRAY spray Place 1 spray under the tongue every 5 (five) minutes x 3 doses as needed for chest pain. 01/01/18  Yes [name], MD  ranolazine (RANEXA) 1000 MG SR tablet Take 500 mg by mouth 2 (two) times daily.   Yes [provider]  senna-docusate (SENOKOT-S) 8.6-50 MG tablet Take 1 tablet by mouth daily. 09/05/18  Yes Irene Shipper, MD  sucralfate (CARAFATE) 1 GM/10ML suspension Take 10 mLs (1 g total) by mouth 4 (four) times daily -  with meals and at bedtime. 09/05/18 10/10/18 Yes Irene Shipper, MD  torsemide (DEMADEX) 20 MG tablet Take 1 tablet (20 mg total) by mouth 2 (two) times daily. FOR SWELLING OR WEIGHT GAIN 08/17/18  Yes [name], MD  vitamin E 400 UNIT capsule Take 400 Units by mouth daily.    Yes [provider]    Scheduled Meds: . diltiazem  120 mg Oral Daily  . dutasteride  0.5 mg Oral Daily  . ezetimibe  10 mg Oral Daily  . [START ON 10/13/2018] pantoprazole  40 mg Intravenous Q12H  . pravastatin  40 mg  Oral q1800  . ranolazine  500 mg Oral BID  . vitamin E  400 Units Oral Daily   Infusions: . sodium chloride    . pantoprozole (PROTONIX) infusion 8 mg/hr (10/10/18 0604)  .  potassium chloride 10 mEq (10/10/18 0846)   PRN Meds: sodium chloride, acetaminophen **OR** acetaminophen, calcium carbonate, ondansetron **OR** ondansetron (ZOFRAN) IV   Allergies as of 10/09/2018 - Review Complete 09/26/2018  Allergen Reaction Noted  . Codeine Nausea And Vomiting 01/22/2014  . Sulfa antibiotics Rash 01/30/2008  . Sulfonamide derivatives Rash 01/30/2008    Family History  Problem Relation Age of Onset  . Breast cancer Paternal Aunt   . Diabetes Sister   . Ulcers Father   . Heart disease Father   . Heart failure Father   . Heart attack Father   . AAA (abdominal aortic aneurysm) Father   . Stroke Mother   . Hypertension Mother   . Colon cancer Neg Hx     Social History   Socioeconomic History  . Marital status: Married    Spouse name: Not on file  . Number of children: Not on file  . Years of education: Not on file  . Highest education level: Not on file  Occupational History  . Not on file  Social Needs  . Financial resource strain: Not hard at all  . Food insecurity    Worry: Never true    Inability: Never true  . Transportation needs    Medical: No    Non-medical: No  Tobacco Use  . Smoking status: Former Smoker    Packs/day: 1.00    Years: 38.00    Pack years: 38.00    Types: Cigarettes    Quit date: 02/09/2005    Years since quitting: 13.6  . Smokeless tobacco: Never Used  . Tobacco comment: "quit smoking cigarrettes 2006"  Substance and Sexual Activity  . Alcohol use: No    Alcohol/week: 0.0 standard drinks  . Drug use: No  . Sexual activity: Not on file  Lifestyle  . Physical activity    Days per week: 0 days    Minutes per session: 0 min  . Stress: Only a little  Relationships  . Social Herbalist on phone: Not on file    Gets together: Not  on file    Attends religious service: Not on file    Active member of club or organization: No    Attends meetings of clubs or organizations: Never    Relationship status: Married  . Intimate partner violence    Fear of current or ex partner: No    Emotionally abused: No    Physically abused: No    Forced sexual activity: No  Other Topics Concern  . Not on file  Social History Narrative  . Not on file    REVIEW OF SYSTEMS: Constitutional: Per HPI ENT:  No nose bleeds Pulm: Per HPI.  No cough. CV: Per HPI.  Chronic left greater than right lower extremity edema. GU:  No hematuria, no frequency GI: See HPI. Heme: See HPI. Transfusions: See HPI. Neuro:  No headaches, no peripheral tingling or numbness Derm:  No itching, no rash or sores.  Endocrine:  No sweats or chills.  No polyuria or dysuria Immunization: Not reviewed. Travel:  None beyond local counties in last few months.    PHYSICAL EXAM: Vital signs in last 24 hours: Vitals:   10/10/18 0351 10/10/18 0853  BP: (!) 100/58 117/71  Pulse: 78 82  Resp: 18 20  Temp: (!) 97.3 F (36.3 C)   SpO2: 95% 94%   Wt Readings from Last 3 Encounters:  10/10/18 62.6 kg  09/26/18 60 kg  09/05/18 62.1 kg  General: Frail, sleepy but arousable WM.  Looks in poor health. Head: No facial asymmetry or swelling.  No signs of head trauma. Eyes: No conjunctival pallor.  No scleral icterus.  EOMI. Ears: Slight HOH Nose: No discharge, no congestion. Mouth: Moist, clear, pink oral mucosa.  Tongue midline. Neck: No JVD, no TMG, no masses. Lungs: Clear bilaterally.  No MRG.  S1, S2 present.  No cough. Heart: Sinus rhythm on monitor.  S1, S2 present. Abdomen: Soft.  Not tender, not distended.  Slightly larger than golf ball sized, painless, non-reducible mass in right groin.  Bowel sounds active.  No HSM. Rectal: FOBT negative, brown stool. Musc/Skeltl: No joint redness or swelling. Extremities: 1+ pitting, pedal, edema slightly worse  on the left foot. Neurologic: Oriented x3.  Sleepy but arousable.  Moves all 4 limbs.  No tremor, no gross weakness or deficits. Skin: Purpura on his arms. Nodes: No cervical adenopathy, no inguinal adenopathy. Psych: Calm, cooperative, pleasant.  Intake/Output from previous day: 08/04 0701 - 08/05 0700 In: 363.4 [P.O.:240; I.V.:3.2; IV Piggyback:120.3] Out: -  Intake/Output this shift: Total I/O In: 797.9 [P.O.:600; I.V.:97.9; IV Piggyback:100] Out: 450 [Urine:450]  LAB RESULTS: Recent Labs    10/10/18 0528  WBC 6.5  HGB 6.7*  HCT 20.7*  PLT 168   BMET Lab Results  Component Value Date   NA 128 (L) 10/10/2018   NA 134 (L) 07/27/2018   NA 134 (L) 07/26/2018   K 2.4 (LL) 10/10/2018   K 3.9 07/27/2018   K 3.8 07/26/2018   CL 87 (L) 10/10/2018   CL 105 07/27/2018   CL 102 07/26/2018   CO2 26 10/10/2018   CO2 22 07/27/2018   CO2 24 07/26/2018   GLUCOSE 145 (H) 10/10/2018   GLUCOSE 114 (H) 07/27/2018   GLUCOSE 100 (H) 07/26/2018   BUN 44 (H) 10/10/2018   BUN 13 07/27/2018   BUN 11 07/26/2018   CREATININE 1.93 (H) 10/10/2018   CREATININE 0.96 07/27/2018   CREATININE 1.06 07/26/2018   CALCIUM 8.2 (L) 10/10/2018   CALCIUM 8.3 (L) 07/27/2018   CALCIUM 8.4 (L) 07/26/2018   LFT Recent Labs    10/10/18 0528  PROT 5.7*  ALBUMIN 2.9*  AST 50*  ALT 31  ALKPHOS 112  BILITOT 0.8   PT/INR Lab Results  Component Value Date   INR 1.08 01/24/2011    RADIOLOGY STUDIES: No results found.   IMPRESSION:   *   Acute, normocytic anemia. Transfusions problematic due to presence of auto antibodies.   *    EGD of 07/26/2018 showed nonbleeding esophageal ulcer, small hiatal hernia, benign esophageal stenosis, pyloric stenosis.  Has been on BID Nexium and 4 times daily Carafate with resolution of previous dysphagia and no heartburn symptoms  *   Left groin hematoma noted at office visit of 7/22, this has since resolved.   Non-acute, painless mass in the right groin  which may be a hernia, but no mention of hernia on CT report of 07/13/18.    *    Chronic Eliquis, last dose 8/4 PM.    *     ASPVD.  07/16/2018 stenting of SMA and celiac.  Post procedure left groin pseudoaneurysm and hematoma noted at exam on 7/22.  *     AKI.  *      Hypokalemia.   PLAN:     *   EGD and possible colonoscopy.  ? Timing.   Will need to have his anemia transfused.  Likely soonest  date for procedures would be 8/7 May have clear liquids.  *   After speaking with the blood bank, they are working on matching of blood from a blood bank in [place] so it may be several hours before transfusions are available for this patient.  *   CTAP with oral contrast, no IV contrast due to AKI.    This to assess the bulge (hematoma) in right groin, r/o hematoma and r/o RP hematoma.    *    No history of black stools and FOBT negative on DRE today, will switch back to oral BID PPI and carafate.    Azucena Freed  10/10/2018, 8:59 AM Phone [phone]

## 2018-10-10 NOTE — Progress Notes (Signed)
Keep patient on clear liquids per Dr. Alcario Drought

## 2018-10-10 NOTE — Progress Notes (Signed)
Patient arrived to unit, had a leg cramp- RN gave mustard and that relieved the pain. Otherwise, patient stable with no complaints. Admission MD paged to notify of patients arrival.

## 2018-10-10 NOTE — Plan of Care (Signed)
  Problem: Education: Goal: Knowledge of General Education information will improve Description Including pain rating scale, medication(s)/side effects and non-pharmacologic comfort measures Outcome: Progressing   Problem: Health Behavior/Discharge Planning: Goal: Ability to manage health-related needs will improve Outcome: Progressing   

## 2018-10-10 NOTE — Progress Notes (Signed)
Pt has continued c/o SOB. States it continues to worsen. Pt does appear to be SOB at rest but actually appears less SOB now than at beginning of shift. Lungs are diminished with wheezes on right. Cough noted and pt states he occasionally coughs up clear mucous. O2 sat continues to be 100% on room air. Pt on telephone with family at present. Will continue to monitor.

## 2018-10-10 NOTE — Progress Notes (Signed)
Pt continues to c/o SOB. V/S WNL. O2 sat 100%. Cori Razor, RN assessed lung sounds as well and agrees with wheezes as well as rhonchi. Will page MD on call for orders.

## 2018-10-10 NOTE — H&P (Addendum)
History and Physical    [name] KVQ:[id number] DOB: Jul 31, 1945 DOA: 10/10/2018  PCP: Garwin Brothers, MD  Patient coming from: [place] transfer  I have personally briefly reviewed patient's old medical records in [place]  Chief Complaint: Generalized weakness  HPI: [name] is a 73 y.o. male with medical history significant of CAD s/p CABG and stents, A.Fib on Eliquis.  Esophageal strictures requiring dilation multiple times.  Chronic mesenteric ischemic s/p stenting back in May.  Also Anemia in May with finding of erosive esophagitis and esophageal ulcer that wasn't bleeding at time of endoscopy.  Patient was discharged on PPIs as well as Carafate through July.  Patient has had 2-3 days of generalized weakness, exercise intolerance.  Patient saw his PCP yesterday and had labs drawn.  His PCP called him up today and told him to "get in to the ED for 2 units of blood" he reports.  No CP, fever, sick contacts.  Denies any melena, hematochezia, emesis.  NO abdominal pain.   ED Course: HGB 7.1.  Creat 1.7 (0.9 back in May), BUN 50.  Sodium 127 K 2.4.  Unable to transfuse at [facility] due to antibodies in blood.  Was started on protonix Gtt as well as NS +20K /1L at 100 cc/hr.  Also got 40 meq K PO  Repeat HGB at 0100 this AM is 6.3   Review of Systems: As per HPI, otherwise all review of systems negative.  Past Medical History:  Diagnosis Date  . Anemia   . Angina   . Atrial fibrillation ([place])   . Blood transfusion   . Bronchitis   . Bulging discs    "8 of them; thoracic, lumbar, sacral area"  . Chronic back pain   . Colon polyps   . Complication of anesthesia   . Coronary artery disease 01/24/11   Successful PCI long segmental stensois  vein graft to the CX marginal vessel-graft previously stented prox. 3.0x78mm TAXUS stent mid seg 3.0x13mm TAXUS stent now tandem stens of 3.0x13mm & 3.0x71mm placed with the seg. 50,80 & 99% stenosis reduced to 0%  . DDD (degenerative disc  disease)   . Diastolic heart failure ([place])   . Dysrhythmia    "PAC's and PVC's"  . Esophageal dilatation    "have had it done 7 times; last time ~ 2001"  . Esophageal stricture   . GERD (gastroesophageal reflux disease)   . Headache(784.0)   . Hiatal hernia   . Hyperlipidemia   . Myocardial infarction ([place]) 1991  . Myocardial infarction [facility]) 2005   "had 3 heart attacks this year"  . Paroxysmal atrial fibrillation (HCC)   . Pyloric stenosis   . RBBB   . Renal artery stenosis (HCC)    hhistory of right renal artery stenting and re-intervention for "in-stent restenosis.  . S/P CABG (coronary artery bypass graft) [place]  . Shortness of breath    "when I had the heart attacks"  . Subclavian artery stenosis, left ([place])   . Ulcer    "small; Dr. Henrene Pastor found it 02/2010"    Past Surgical History:  Procedure Laterality Date  . ADENOIDECTOMY     "when I was an infant"  . APPENDECTOMY  1953  . BIOPSY  07/26/2018   Procedure: BIOPSY;  Surgeon: Thornton Park, MD;  Location: [place];  Service: Gastroenterology;;  . CARDIOVERSION N/A 04/17/2017   Procedure: CARDIOVERSION;  Surgeon: Jerline Pain, MD;  Location: [facility];  Service: Cardiovascular;  Laterality: N/A;  . CAROTID ENDARTERECTOMY Right [place]  01/24/11   "today makes a total of 9 stents"  . CORONARY ARTERY BYPASS GRAFT  1984   CABG X 4  . CORONARY ARTERY BYPASS GRAFT  1996   CABG X 4  . ESOPHAGOGASTRODUODENOSCOPY (EGD) WITH PROPOFOL N/A 07/26/2018   Procedure: ESOPHAGOGASTRODUODENOSCOPY (EGD) WITH PROPOFOL;  Surgeon: Thornton Park, MD;  Location: [place];  Service: Gastroenterology;  Laterality: N/A;  . LEFT HEART CATHETERIZATION WITH CORONARY/GRAFT ANGIOGRAM N/A 01/24/2011   Procedure: LEFT HEART CATHETERIZATION WITH Beatrix Fetters;  Surgeon: Troy Sine, MD;  Location: [facility] CATH LAB;  Service:  Cardiovascular;  Laterality: N/A;  . PERIPHERAL VASCULAR INTERVENTION  07/16/2018   Procedure: PERIPHERAL VASCULAR INTERVENTION;  Surgeon: Elam Dutch, MD;  Location: [place] CV LAB;  Service: Cardiovascular;;  SMA and Celiac  . pylondial cyst removal    . RETINAL DETACHMENT SURGERY  04/2007   right  . TONSILLECTOMY  1972  . VISCERAL ANGIOGRAPHY N/A 07/16/2018   Procedure: VISCERAL ANGIOGRAPHY;  Surgeon: Elam Dutch, MD;  Location: [place] CV LAB;  Service: Cardiovascular;  Laterality: N/A;     reports that he quit smoking about 13 years ago. His smoking use included cigarettes. He has a 38.00 pack-year smoking history. He has never used smokeless tobacco. He reports that he does not drink alcohol or use drugs.  Allergies  Allergen Reactions  . Codeine Nausea And Vomiting  . Sulfa Antibiotics Rash  . Sulfonamide Derivatives Rash    Family History  Problem Relation Age of Onset  . Breast cancer Paternal Aunt   . Diabetes Sister   . Ulcers Father   . Heart disease Father   . Heart failure Father   . Heart attack Father   . AAA (abdominal aortic aneurysm) Father   . Stroke Mother   . Hypertension Mother   . Colon cancer Neg Hx      Prior to Admission medications   Medication Sig Start Date End Date Taking? Authorizing Provider  aspirin EC 81 MG EC tablet Take 1 tablet (81 mg total) by mouth daily. 07/29/18   Kayleen Memos, DO  calcium carbonate (TUMS - DOSED IN MG ELEMENTAL CALCIUM) 500 MG chewable tablet Chew 1 tablet (200 mg of elemental calcium total) by mouth 3 (three) times daily as needed for indigestion or heartburn. 07/29/18   Kayleen Memos, DO  diltiazem (CARDIZEM CD) 120 MG 24 hr capsule Take 1 capsule (120 mg total) by mouth daily. 08/02/18   Almyra Deforest, PA  diltiazem (CARDIZEM) 30 MG tablet Take 1 tablet (30 mg total) by mouth as needed (for heart rate greater than 120). 08/02/18   Almyra Deforest, PA  dutasteride (AVODART) 0.5 MG capsule Take 0.5 mg by mouth  daily.  03/22/14   [provider]  ELIQUIS 5 MG TABS tablet TAKE 1 TABLET BY MOUTH TWICE A DAY Patient taking differently: Take 5 mg by mouth 2 (two) times daily.  05/14/18   Camnitz, Ocie Doyne, MD  esomeprazole (NEXIUM) 40 MG capsule Take 1 capsule (40 mg total) by mouth 2 (two) times daily before a meal. 09/05/18 11/04/18  Irene Shipper, MD  ezetimibe (ZETIA) 10 MG tablet TAKE 1 TABLET (10 MG TOTAL) BY MOUTH DAILY. Patient taking differently: Take 10 mg by mouth daily.  03/05/18   Troy Sine, MD  Ketotifen Fumarate Hezzie Bump  OP) Place 1 drop into the right eye daily.    [provider]  loratadine (CLARITIN) 10 MG tablet Take 10 mg by mouth daily as needed for allergies, rhinitis or itching.  12/12/13   [provider]  lovastatin (MEVACOR) 40 MG tablet Take 20 mg by mouth at bedtime.    [provider]  Multiple Vitamins-Minerals (MULTIVITAMINS THER. W/MINERALS) TABS Take 1 tablet by mouth daily.      [provider]  nitroGLYCERIN (NITROLINGUAL) 0.4 MG/SPRAY spray Place 1 spray under the tongue every 5 (five) minutes x 3 doses as needed for chest pain. 01/01/18   [name], MD  ranolazine (RANEXA) 1000 MG SR tablet Take 500 mg by mouth 2 (two) times daily.    [provider]  senna-docusate (SENOKOT-S) 8.6-50 MG tablet Take 1 tablet by mouth daily. 09/05/18   Irene Shipper, MD  sucralfate (CARAFATE) 1 GM/10ML suspension Take 10 mLs (1 g total) by mouth 4 (four) times daily -  with meals and at bedtime. 09/05/18 10/05/18  Irene Shipper, MD  torsemide (DEMADEX) 20 MG tablet Take 1 tablet (20 mg total) by mouth 2 (two) times daily. FOR SWELLING OR WEIGHT GAIN 08/17/18   [name], MD  vitamin E 400 UNIT capsule Take 400 Units by mouth daily.     [provider]    Physical Exam: Vitals:   10/10/18 0351  BP: (!) 100/58  Pulse: 78  Resp: 18  Temp: (!) 97.3 F (36.3 C)  TempSrc: Oral  SpO2: 95%  Weight: 62.6 kg   Height: 5\' 5"  (1.651 m)    Constitutional: NAD, calm, comfortable Eyes: PERRL, lids and conjunctivae normal ENMT: Mucous membranes are moist. Posterior pharynx clear of any exudate or lesions.Normal dentition.  Neck: normal, supple, no masses, no thyromegaly Respiratory: clear to auscultation bilaterally, no wheezing, no crackles. Normal respiratory effort. No accessory muscle use.  Cardiovascular: Regular rate and rhythm, no murmurs / rubs / gallops. No extremity edema. 2+ pedal pulses. No carotid bruits.  Abdomen: no tenderness, no masses palpated. No hepatosplenomegaly. Bowel sounds positive.  Musculoskeletal: no clubbing / cyanosis. No joint deformity upper and lower extremities. Good ROM, no contractures. Normal muscle tone.  Skin: no rashes, lesions, ulcers. No induration Neurologic: CN 2-12 grossly intact. Sensation intact, DTR normal. Strength 5/5 in all 4.  Psychiatric: Normal judgment and insight. Alert and oriented x 3. Normal mood.    Labs on Admission: I have personally reviewed following labs and imaging studies  CBC: No results for input(s): WBC, NEUTROABS, HGB, HCT, MCV, PLT in the last 168 hours. Basic Metabolic Panel: No results for input(s): NA, K, CL, CO2, GLUCOSE, BUN, CREATININE, CALCIUM, MG, PHOS in the last 168 hours. GFR: CrCl cannot be calculated (Patient's most recent lab result is older than the maximum 21 days allowed.). Liver Function Tests: No results for input(s): AST, ALT, ALKPHOS, BILITOT, PROT, ALBUMIN in the last 168 hours. No results for input(s): LIPASE, AMYLASE in the last 168 hours. No results for input(s): AMMONIA in the last 168 hours. Coagulation Profile: No results for input(s): INR, PROTIME in the last 168 hours. Cardiac Enzymes: No results for input(s): CKTOTAL, CKMB, CKMBINDEX, TROPONINI in the last 168 hours. BNP (last 3 results) No results for input(s): PROBNP in the last 8760 hours. HbA1C: No results for input(s): HGBA1C in the  last 72 hours. CBG: No results for input(s): GLUCAP in the last 168 hours. Lipid Profile: No results for input(s): CHOL,  HDL, LDLCALC, TRIG, CHOLHDL, LDLDIRECT in the last 72 hours. Thyroid Function Tests: No results for input(s): TSH, T4TOTAL, FREET4, T3FREE, THYROIDAB in the last 72 hours. Anemia Panel: No results for input(s): VITAMINB12, FOLATE, FERRITIN, TIBC, IRON, RETICCTPCT in the last 72 hours. Urine analysis:    Component Value Date/Time   COLORURINE YELLOW 07/24/2018 0108   APPEARANCEUR CLEAR 07/24/2018 0108   LABSPEC 1.017 07/24/2018 0108   PHURINE 6.0 07/24/2018 0108   GLUCOSEU NEGATIVE 07/24/2018 0108   HGBUR NEGATIVE 07/24/2018 0108   BILIRUBINUR NEGATIVE 07/24/2018 0108   KETONESUR NEGATIVE 07/24/2018 0108   PROTEINUR 30 (A) 07/24/2018 0108   NITRITE NEGATIVE 07/24/2018 0108   LEUKOCYTESUR NEGATIVE 07/24/2018 0108    Radiological Exams on Admission: No results found.  EKG: Independently reviewed.  Assessment/Plan Principal Problem:   Symptomatic anemia Active Problems:   Essential hypertension   EROSIVE ESOPHAGITIS   Ulcer of esophagus without bleeding   Atrial fibrillation (HCC)   AKI (acute kidney injury) (HCC)   Hypokalemia    1. Symptomatic anemia - Presumably GIB in setting of eliquis use and esophageal ulcer. 1. 2u PRBC transfusion 2. H/H post transfusion 3. Clear liquid diet 4. PPI GTT 5. Call GI in AM 2. AKI - 1. IVF: 1L at 100 cc/hr finishing now 2. Plus 2u PRBC transfusion 3. Repeat BMP now and tomorrow AM 3. PAF - 1. HOLD eliquis (last dose was at around 1pm yesterday per patient). 2. Continue cardizem 3. Tele monitor 4. HTN - 1. Continue cardizem for the PAF (doesn't tolerate A.Fib very well apparently it seems from notes). 2. Not really on any other BP meds though 5. Hypokalemia - 1. Replace K: 31meq PO + 20 meq with the 1L from the ED for K of 2.4. 2. Will order another 4 runs IV K  DVT prophylaxis: SCDs Code Status: Full  Family Communication: No family in room Disposition Plan: Home after admit Consults called: None, call GI in AM Admission status: Place in obs - likely convert to IP later today    GARDNER, JARED M. DO [facility]  How to contact the [facility] Attending or Consulting provider [place] or covering provider during after hours [place], for this patient?  1. Check the care team in [facility] and look for a) attending/consulting [facility] provider listed and b) the [facility] team listed 2. Log into [email or web address]  Amion Physician Scheduling and messaging for groups and whole hospitals  On call and physician scheduling software for group practices, residents, hospitalists and other medical providers for call, clinic, rotation and shift schedules. On[employer] is a hospital-wide system for scheduling doctors and paging doctors on call. EasyPlot is for scientific plotting and data analysis.  [email or web address]  and use [place]'s universal password to access. If you do not have the password, please contact the hospital operator.  3. Locate the [facility] provider you are looking for under [facility] and page to a number that you can be directly reached. 4. If you still have difficulty reaching the provider, please page the [facility] (Director on Call) for the Hospitalists listed on amion for assistance.  10/10/2018, 4:36 AM

## 2018-10-10 NOTE — Progress Notes (Signed)
Covid test sent to lab.

## 2018-10-10 NOTE — Plan of Care (Signed)

## 2018-10-10 NOTE — Progress Notes (Signed)
Blood bank called to advise pt has build up 3rd immunity and 0 negative. They will order stat units from [place] which could be 4 hours, pt stable and no distress at this time, paed Dr Ree Kida to advise

## 2018-10-11 DIAGNOSIS — Z8249 Family history of ischemic heart disease and other diseases of the circulatory system: Secondary | ICD-10-CM | POA: Diagnosis not present

## 2018-10-11 DIAGNOSIS — I130 Hypertensive heart and chronic kidney disease with heart failure and stage 1 through stage 4 chronic kidney disease, or unspecified chronic kidney disease: Secondary | ICD-10-CM | POA: Diagnosis present

## 2018-10-11 DIAGNOSIS — K311 Adult hypertrophic pyloric stenosis: Secondary | ICD-10-CM | POA: Diagnosis present

## 2018-10-11 DIAGNOSIS — K4090 Unilateral inguinal hernia, without obstruction or gangrene, not specified as recurrent: Secondary | ICD-10-CM | POA: Diagnosis present

## 2018-10-11 DIAGNOSIS — N179 Acute kidney failure, unspecified: Secondary | ICD-10-CM | POA: Diagnosis present

## 2018-10-11 DIAGNOSIS — R1310 Dysphagia, unspecified: Secondary | ICD-10-CM | POA: Diagnosis not present

## 2018-10-11 DIAGNOSIS — K222 Esophageal obstruction: Secondary | ICD-10-CM | POA: Diagnosis not present

## 2018-10-11 DIAGNOSIS — R0600 Dyspnea, unspecified: Secondary | ICD-10-CM

## 2018-10-11 DIAGNOSIS — K2210 Ulcer of esophagus without bleeding: Secondary | ICD-10-CM | POA: Diagnosis not present

## 2018-10-11 DIAGNOSIS — Z833 Family history of diabetes mellitus: Secondary | ICD-10-CM | POA: Diagnosis not present

## 2018-10-11 DIAGNOSIS — E871 Hypo-osmolality and hyponatremia: Secondary | ICD-10-CM | POA: Diagnosis present

## 2018-10-11 DIAGNOSIS — Z951 Presence of aortocoronary bypass graft: Secondary | ICD-10-CM | POA: Diagnosis not present

## 2018-10-11 DIAGNOSIS — E876 Hypokalemia: Secondary | ICD-10-CM | POA: Diagnosis present

## 2018-10-11 DIAGNOSIS — E785 Hyperlipidemia, unspecified: Secondary | ICD-10-CM | POA: Diagnosis present

## 2018-10-11 DIAGNOSIS — J918 Pleural effusion in other conditions classified elsewhere: Secondary | ICD-10-CM | POA: Diagnosis present

## 2018-10-11 DIAGNOSIS — D649 Anemia, unspecified: Secondary | ICD-10-CM | POA: Diagnosis present

## 2018-10-11 DIAGNOSIS — R0603 Acute respiratory distress: Secondary | ICD-10-CM | POA: Diagnosis present

## 2018-10-11 DIAGNOSIS — I2510 Atherosclerotic heart disease of native coronary artery without angina pectoris: Secondary | ICD-10-CM | POA: Diagnosis present

## 2018-10-11 DIAGNOSIS — I480 Paroxysmal atrial fibrillation: Secondary | ICD-10-CM | POA: Diagnosis present

## 2018-10-11 DIAGNOSIS — I5033 Acute on chronic diastolic (congestive) heart failure: Secondary | ICD-10-CM | POA: Diagnosis present

## 2018-10-11 DIAGNOSIS — Z955 Presence of coronary angioplasty implant and graft: Secondary | ICD-10-CM | POA: Diagnosis not present

## 2018-10-11 DIAGNOSIS — Z823 Family history of stroke: Secondary | ICD-10-CM | POA: Diagnosis not present

## 2018-10-11 DIAGNOSIS — K2970 Gastritis, unspecified, without bleeding: Secondary | ICD-10-CM | POA: Diagnosis present

## 2018-10-11 DIAGNOSIS — I4819 Other persistent atrial fibrillation: Secondary | ICD-10-CM | POA: Diagnosis not present

## 2018-10-11 DIAGNOSIS — I252 Old myocardial infarction: Secondary | ICD-10-CM | POA: Diagnosis not present

## 2018-10-11 DIAGNOSIS — K449 Diaphragmatic hernia without obstruction or gangrene: Secondary | ICD-10-CM | POA: Diagnosis present

## 2018-10-11 DIAGNOSIS — J90 Pleural effusion, not elsewhere classified: Secondary | ICD-10-CM | POA: Diagnosis present

## 2018-10-11 DIAGNOSIS — I10 Essential (primary) hypertension: Secondary | ICD-10-CM | POA: Diagnosis not present

## 2018-10-11 DIAGNOSIS — D500 Iron deficiency anemia secondary to blood loss (chronic): Secondary | ICD-10-CM | POA: Diagnosis not present

## 2018-10-11 DIAGNOSIS — R188 Other ascites: Secondary | ICD-10-CM | POA: Diagnosis present

## 2018-10-11 DIAGNOSIS — Z20828 Contact with and (suspected) exposure to other viral communicable diseases: Secondary | ICD-10-CM | POA: Diagnosis present

## 2018-10-11 DIAGNOSIS — N183 Chronic kidney disease, stage 3 (moderate): Secondary | ICD-10-CM | POA: Diagnosis present

## 2018-10-11 LAB — BASIC METABOLIC PANEL
Anion gap: 13 (ref 5–15)
Anion gap: 13 (ref 5–15)
BUN: 37 mg/dL — ABNORMAL HIGH (ref 8–23)
BUN: 32 mg/dL — ABNORMAL HIGH (ref 8–23)
CO2: 29 mmol/L (ref 22–32)
CO2: 27 mmol/L (ref 22–32)
Calcium: 8.1 mg/dL — ABNORMAL LOW (ref 8.9–10.3)
Calcium: 8.2 mg/dL — ABNORMAL LOW (ref 8.9–10.3)
Chloride: 81 mmol/L — ABNORMAL LOW (ref 98–111)
Chloride: 85 mmol/L — ABNORMAL LOW (ref 98–111)
Creatinine, Ser: 1.53 mg/dL — ABNORMAL HIGH (ref 0.61–1.24)
Creatinine, Ser: 1.53 mg/dL — ABNORMAL HIGH (ref 0.61–1.24)
GFR calc Af Amer: 51 mL/min — ABNORMAL LOW (ref >60)
GFR calc Af Amer: 51 mL/min — ABNORMAL LOW (ref >60)
GFR calc non Af Amer: 44 mL/min — ABNORMAL LOW (ref >60)
GFR calc non Af Amer: 44 mL/min — ABNORMAL LOW (ref >60)
Glucose, Bld: 112 mg/dL — ABNORMAL HIGH (ref 70–99)
Glucose, Bld: 142 mg/dL — ABNORMAL HIGH (ref 70–99)
Potassium: 2.9 mmol/L — ABNORMAL LOW (ref 3.5–5.1)
Potassium: 3.1 mmol/L — ABNORMAL LOW (ref 3.5–5.1)
Sodium: 123 mmol/L — ABNORMAL LOW (ref 135–145)
Sodium: 125 mmol/L — ABNORMAL LOW (ref 135–145)

## 2018-10-11 LAB — CBC
HCT: 27 % — ABNORMAL LOW (ref 39.0–52.0)
Hemoglobin: 9.1 g/dL — ABNORMAL LOW (ref 13.0–17.0)
MCH: 28.2 pg (ref 26.0–34.0)
MCHC: 33.7 g/dL (ref 30.0–36.0)
MCV: 83.6 fL (ref 80.0–100.0)
Platelets: 160 10*3/uL (ref 150–400)
RBC: 3.23 MIL/uL — ABNORMAL LOW (ref 4.22–5.81)
RDW: 15.1 % (ref 11.5–15.5)
WBC: 6 10*3/uL (ref 4.0–10.5)
nRBC: 0 % (ref 0.0–0.2)

## 2018-10-11 MED ORDER — POTASSIUM CHLORIDE CRYS ER 20 MEQ PO TBCR
40.0000 meq | EXTENDED_RELEASE_TABLET | ORAL | Status: AC
  Administered 2018-10-11 (×2): 40 meq via ORAL
  Filled 2018-10-11 (×2): qty 2

## 2018-10-11 MED ORDER — IPRATROPIUM-ALBUTEROL 0.5-2.5 (3) MG/3ML IN SOLN: 3.0000 mL | Freq: Four times a day (QID) | RESPIRATORY_TRACT | Status: DC | PRN

## 2018-10-11 MED ORDER — SENNOSIDES-DOCUSATE SODIUM 8.6-50 MG PO TABS
1.0000 | ORAL_TABLET | Freq: Every day | ORAL | Status: DC
  Administered 2018-10-11 – 2018-10-14 (×4): 1 via ORAL
  Filled 2018-10-11 (×4): qty 1

## 2018-10-11 MED ORDER — FUROSEMIDE 10 MG/ML IJ SOLN
40.0000 mg | Freq: Once | INTRAMUSCULAR | Status: AC
  Administered 2018-10-11: 40 mg via INTRAVENOUS
  Filled 2018-10-11: qty 4

## 2018-10-11 NOTE — Progress Notes (Signed)
PROGRESS NOTE    [name]  DJT:[id number] DOB: Feb 07, 1946 DOA: 10/10/2018 PCP: Garwin Brothers, MD   Brief Narrative:  HPI On 10/10/2018 by Dr. Jennette Kettle [name] is a 73 y.o. male with medical history significant of CAD s/p CABG and stents, A.Fib on Eliquis.  Esophageal strictures requiring dilation multiple times.  Chronic mesenteric ischemic s/p stenting back in May.  Also Anemia in May with finding of erosive esophagitis and esophageal ulcer that wasn't bleeding at time of endoscopy.  Patient was discharged on PPIs as well as Carafate through July.  Patient has had 2-3 days of generalized weakness, exercise intolerance.  Patient saw his PCP yesterday and had labs drawn.  His PCP called him up today and told him to "get in to the ED for 2 units of blood" he reports.  No CP, fever, sick contacts.  Denies any melena, hematochezia, emesis.  NO abdominal pain.  Interim history Admitted with symptomatic anemia and GI bleed. GI consulted.  Assessment & Plan   Symptomatic anemia/GI bleed -presented with generalized weakness, exercise intolerance -Hemoglobin down to 6.7, was transfused 2 units PRBC -Hemoglobin today 9.1 -today complains of shortness of breath -Patient recently had EGD on 07/26/2018: Nonbleeding esophageal ulcer, biopsied.  Benign-appearing esophageal stenosis.  Small hiatal hernia.  Gastric stenosis was found at the pylorus. Biopsy negative for malignancy and Candida. -CT A/P: No inguinal hernia, no evidence of retroperitoneal bleed.  Volume overload with bilateral pleural effusions and small volume ascites. -Gastroenterology consulted and appreciated, probable upper endoscopy and colonoscopy once hemoglobin has improved and respiratory status/ volume status improves -Continue protonix 40mg  PO BID, carafate   Acute dsypnea/ Acute on chronic possibly CHF exacerbation  -Possibly secondary to fluid overload given that patient received 2 units PRBC as well as IV fluids  and IV medications -Chest x-ray yesterday evening reviewed and shows right-sided pleural effusion, no infection -Was given Lasix 20mg  IV once yesterday as well as between blood transfusions -Will give additional dose of lasix today 40mg  IV and continue to monitor  -last Echocardiogram June 2020- showed EF 30-09%, diastolic parameters were indeterminate -Upon review of chart, patient did have EF 45% on catheretization   -monitor intake/output, daily weights -will order neb treatments, continue supplemental oxygen  Acute kidney injury -Creatinine on admission 1.93.  Baseline approximately 0.9 -Creatinine down to 1.53 -Likely secondary to the above -Continue to monitor BMP  Paroxysmal atrial fibrillation -Eliquis held -Continue Cardizem  Essential hypertension -Continue Cardizem  Hypokalemia -Potassium mildly improved to 2.9 -Continue to replace and monitor  -Magnesium 2.1   Hyponatremia/Hypochloremia -possibly due to diuresis -will continue to monitor   DVT Prophylaxis  SCDs  Code Status: Full  Family Communication: None at bedside  Disposition Plan: Given shortness of breath in the setting of anemia and possible overload, feel patient is at high risk of decompensation and will require several more days of hospitalization. Dispo pending improvement in respiratory status as well as GI recommendations.   Consultants Gastroenterology   Procedures  None  Antibiotics   Anti-infectives (From admission, onward)   None      Subjective:   [place] seen and examined today.  Patient feels very short of breath this morning.  Denies chest pain, abdominal pain, nausea, vomiting, diarrhea, constipation, dizziness, headache. Feels he cannot speak a full sentence without stopping to breathe.   Objective:   Vitals:   10/11/18 0015 10/11/18 0050 10/11/18 0437 10/11/18 0833  BP: 116/72 118/77 131/65 119/68  Pulse:  62 78 87 80  Resp: (!) 22 20 (!) 22 16  Temp: 98.2 F (36.8  C) 98.3 F (36.8 C) 98.7 F (37.1 C) 98.6 F (37 C)  TempSrc: Oral Oral Oral Oral  SpO2:  96% 95% 99%  Weight:  65 kg    Height:        Intake/Output Summary (Last 24 hours) at 10/11/2018 0849 Last data filed at 10/11/2018 0440 Gross per 24 hour  Intake 4368.54 ml  Output 1840 ml  Net 2528.54 ml   Filed Weights   10/10/18 0351 10/11/18 0050  Weight: 62.6 kg 65 kg    Exam  General: Well developed, well nourished, NAD, appears stated age  56: NCAT, mucous membranes moist.   Cardiovascular: S1 S2 auscultated, irregular  Respiratory: Exp wheezing throughout all lung fields, diminished at the bases, tachypneic   Abdomen: Soft, nontender, nondistended, + bowel sounds  Extremities: warm dry without cyanosis clubbing or edema  Neuro: AAOx3, nonfocal  Psych: Appropriate mood and affect    Data Reviewed: I have personally reviewed following labs and imaging studies  CBC: Recent Labs  Lab 10/10/18 0528 10/11/18 0154  WBC 6.5 6.0  HGB 6.7* 9.1*  HCT 20.7* 27.0*  MCV 85.9 83.6  PLT 168 220   Basic Metabolic Panel: Recent Labs  Lab 10/10/18 0528 10/11/18 0154  NA 128* 123*  K 2.4* 2.9*  CL 87* 81*  CO2 26 29  GLUCOSE 145* 112*  BUN 44* 37*  CREATININE 1.93* 1.53*  CALCIUM 8.2* 8.1*  MG 2.1  --    GFR: Estimated Creatinine Clearance: 36.8 mL/min (A) (by C-G formula based on SCr of 1.53 mg/dL (H)). Liver Function Tests: Recent Labs  Lab 10/10/18 0528  AST 50*  ALT 31  ALKPHOS 112  BILITOT 0.8  PROT 5.7*  ALBUMIN 2.9*   No results for input(s): LIPASE, AMYLASE in the last 168 hours. No results for input(s): AMMONIA in the last 168 hours. Coagulation Profile: No results for input(s): INR, PROTIME in the last 168 hours. Cardiac Enzymes: No results for input(s): CKTOTAL, CKMB, CKMBINDEX, TROPONINI in the last 168 hours. BNP (last 3 results) No results for input(s): PROBNP in the last 8760 hours. HbA1C: No results for input(s): HGBA1C in the last  72 hours. CBG: No results for input(s): GLUCAP in the last 168 hours. Lipid Profile: No results for input(s): CHOL, HDL, LDLCALC, TRIG, CHOLHDL, LDLDIRECT in the last 72 hours. Thyroid Function Tests: No results for input(s): TSH, T4TOTAL, FREET4, T3FREE, THYROIDAB in the last 72 hours. Anemia Panel: No results for input(s): VITAMINB12, FOLATE, FERRITIN, TIBC, IRON, RETICCTPCT in the last 72 hours. Urine analysis:    Component Value Date/Time   COLORURINE YELLOW 07/24/2018 0108   APPEARANCEUR CLEAR 07/24/2018 0108   LABSPEC 1.017 07/24/2018 0108   PHURINE 6.0 07/24/2018 0108   GLUCOSEU NEGATIVE 07/24/2018 0108   HGBUR NEGATIVE 07/24/2018 0108   BILIRUBINUR NEGATIVE 07/24/2018 0108   KETONESUR NEGATIVE 07/24/2018 0108   PROTEINUR 30 (A) 07/24/2018 0108   NITRITE NEGATIVE 07/24/2018 0108   LEUKOCYTESUR NEGATIVE 07/24/2018 0108   Sepsis Labs: @LABRCNTIP (procalcitonin:4,lacticidven:4)  ) Recent Results (from the past 240 hour(s))  SARS Coronavirus 2 [facility] order, Performed in [place] hospital lab)     Status: None   Collection Time: 10/10/18  4:09 AM  Result Value Ref Range Status   SARS Coronavirus 2 NEGATIVE NEGATIVE Final    Comment: (NOTE) If result is NEGATIVE SARS-CoV-2 target nucleic acids are NOT DETECTED. The SARS-CoV-2  RNA is generally detectable in upper and lower  respiratory specimens during the acute phase of infection. The lowest  concentration of SARS-CoV-2 viral copies this assay can detect is 250  copies / mL. A negative result does not preclude SARS-CoV-2 infection  and should not be used as the sole basis for treatment or other  patient management decisions.  A negative result may occur with  improper specimen collection / handling, submission of specimen other  than nasopharyngeal swab, presence of viral mutation(s) within the  areas targeted by this assay, and inadequate number of viral copies  (<250 copies / mL). A negative result must be  combined with clinical  observations, patient history, and epidemiological information. If result is POSITIVE SARS-CoV-2 target nucleic acids are DETECTED. The SARS-CoV-2 RNA is generally detectable in upper and lower  respiratory specimens dur ing the acute phase of infection.  Positive  results are indicative of active infection with SARS-CoV-2.  Clinical  correlation with patient history and other diagnostic information is  necessary to determine patient infection status.  Positive results do  not rule out bacterial infection or co-infection with other viruses. If result is PRESUMPTIVE POSTIVE SARS-CoV-2 nucleic acids MAY BE PRESENT.   A presumptive positive result was obtained on the submitted specimen  and confirmed on repeat testing.  While 2019 novel coronavirus  (SARS-CoV-2) nucleic acids may be present in the submitted sample  additional confirmatory testing may be necessary for epidemiological  and / or clinical management purposes  to differentiate between  SARS-CoV-2 and other Sarbecovirus currently known to infect humans.  If clinically indicated additional testing with an alternate test  methodology [phone]) is advised. The SARS-CoV-2 RNA is generally  detectable in upper and lower respiratory sp ecimens during the acute  phase of infection. The expected result is Negative. Fact Sheet for Patients:  StrictlyIdeas.no Fact Sheet for Healthcare Providers: BankingDealers.co.za This test is not yet approved or cleared by the [place] FDA and has been authorized for detection and/or diagnosis of SARS-CoV-2 by FDA under an Emergency Use Authorization (EUA).  This EUA will remain in effect (meaning this test can be used) for the duration of the COVID-19 declaration under Section 564(b)(1) of the Act, 21 U.S.C. section 360bbb-3(b)(1), unless the authorization is terminated or revoked sooner. Performed at [facility], [place] [address] [place], [place] [address]       Radiology Studies: Ct Abdomen Pelvis Wo Contrast  Result Date: 10/10/2018 CLINICAL DATA:  Acute anemia. Bulge in the right groin. EXAM: CT ABDOMEN AND PELVIS WITHOUT CONTRAST TECHNIQUE: Multidetector CT imaging of the abdomen and pelvis was performed following the standard protocol without IV contrast. COMPARISON:  CT scan dated 07/13/2018 and chest x-ray dated 10/04/2018 FINDINGS: Lower chest: Patient has developed a moderate right pleural effusion, increased since 09/04/2018, and a small left pleural effusion. There is compressive atelectasis in the right lower lobe. Heart size is normal. Coronary artery calcifications. Hepatobiliary: No focal liver abnormality is seen. No gallstones, gallbladder wall thickening, or biliary dilatation. Pancreas: Unremarkable. No pancreatic ductal dilatation or surrounding inflammatory changes. Spleen: Normal in size without focal abnormality. Adrenals/Urinary Tract: Adrenal glands are unremarkable. Kidneys are normal, without renal calculi, focal lesion, or hydronephrosis. Bladder is unremarkable. Stomach/Bowel: There is a right inguinal hernia containing fluid and a small loop of small bowel. No dilatation of the bowel within, proximal, or distal to the hernia. This is new since the prior study. There is also a small left inguinal hernia  containing only fluid. The bowel otherwise appears normal. Appendix has been removed. Vascular/Lymphatic: Extensive aortic atherosclerosis. No adenopathy. Reproductive: Prostate gland is unremarkable. Large left hydrocele. Small right hydrocele. Other: Small amount of ascites around the liver and in the pericolic gutters and in the pelvis. Diffuse mild anasarca, increased since the prior study. Musculoskeletal: No acute abnormality. IMPRESSION: 1. New right inguinal hernia containing fluid and a small loop of small bowel without obstruction. No evidence of inguinal or retroperitoneal or  intra-abdominal hematoma. 2. Small left inguinal hernia containing only fluid. 3. Increased bilateral pleural effusions, right greater than left. 4. Small amount of ascites.  Increased anasarca. Aortic Atherosclerosis (ICD10-I70.0). Electronically Signed   By: Lorriane Shire M.D.   On: 10/10/2018 14:57   Dg Chest Port 1 View  Result Date: 10/10/2018 CLINICAL DATA:  Wheezing EXAM: PORTABLE CHEST 1 VIEW COMPARISON:  10/04/2018 FINDINGS: Cardiac shadow is mildly enlarged but stable. Postsurgical changes are seen. Enlarging right-sided pleural effusion is noted with underlying atelectatic changes. The left lung is clear. IMPRESSION: Increasing right-sided pleural effusion with underlying atelectatic changes. Electronically Signed   By: Inez Catalina M.D.   On: 10/10/2018 23:55     Scheduled Meds:  diltiazem  120 mg Oral Daily   dutasteride  0.5 mg Oral Daily   ezetimibe  10 mg Oral Daily   pantoprazole  40 mg Oral BID   potassium chloride  40 mEq Oral Q4H   pravastatin  40 mg Oral q1800   ranolazine  500 mg Oral BID   sucralfate  1 g Oral Q6H   vitamin E  400 Units Oral Daily   Continuous Infusions:  sodium chloride 250 mL (10/10/18 1648)     LOS: 0 days   Time Spent in minutes   45 minutes   Izreal Kock D.O. on 10/11/2018 at 8:49 AM  Between 7am to 7pm - Please see pager noted on [email or web address]  After 7pm go to [email or web address]  And look for the night coverage person covering for me after hours  [facility] Office  [phone]

## 2018-10-11 NOTE — Progress Notes (Signed)
Daily Rounding Note  10/11/2018, 8:23 AM  LOS: 0 days   SUBJECTIVE:   Chief complaint:  Blood loss anemia.  Dark stool.     SOB overnight, increased R pleural effusion on CXR.  Persists despite Lasix IV 20 mg, getting additional Lasix now.  Cramping in legs with hypokalemia.    OBJECTIVE:         Vital signs in last 24 hours:    Temp:  [97.6 F (36.4 C)-98.7 F (37.1 C)] 98.7 F (37.1 C) (08/06 0437) Pulse Rate:  [60-87] 87 (08/06 0437) Resp:  [16-22] 22 (08/06 0437) BP: (101-131)/(61-81) 131/65 (08/06 0437) SpO2:  [94 %-100 %] 95 % (08/06 0437) Weight:  [65 kg] 65 kg (08/06 0050) Last BM Date: 10/09/18 Filed Weights   10/10/18 0351 10/11/18 0050  Weight: 62.6 kg 65 kg   General: looks frail, weak, chronically ill.  Alert.     Heart: RRR Chest: diminished BS at bases bil.  Some mild dyspnea Abdomen: NT, ND.  Active BS  Extremities: minor pedal edema Neuro/Psych:  Oriented x 3.  Calm.  No gross deficits.    Intake/Output from previous day: 08/05 0701 - 08/06 0700 In: 5166.5 [P.O.:3840; I.V.:199.2; Blood:784; IV Piggyback:343.3] Out: 2290 [Urine:2290]  Intake/Output this shift: No intake/output data recorded.  Lab Results: Recent Labs    10/10/18 0528 10/11/18 0154  WBC 6.5 6.0  HGB 6.7* 9.1*  HCT 20.7* 27.0*  PLT 168 160   BMET Recent Labs    10/10/18 0528 10/11/18 0154  NA 128* 123*  K 2.4* 2.9*  CL 87* 81*  CO2 26 29  GLUCOSE 145* 112*  BUN 44* 37*  CREATININE 1.93* 1.53*  CALCIUM 8.2* 8.1*   LFT Recent Labs    10/10/18 0528  PROT 5.7*  ALBUMIN 2.9*  AST 50*  ALT 31  ALKPHOS 112  BILITOT 0.8   PT/INR No results for input(s): LABPROT, INR in the last 72 hours. Hepatitis Panel No results for input(s): HEPBSAG, HCVAB, HEPAIGM, HEPBIGM in the last 72 hours.  Studies/Results: Ct Abdomen Pelvis Wo Contrast  Result Date: 10/10/2018 CLINICAL DATA:  Acute anemia. Bulge in the right  groin. EXAM: CT ABDOMEN AND PELVIS WITHOUT CONTRAST TECHNIQUE: Multidetector CT imaging of the abdomen and pelvis was performed following the standard protocol without IV contrast. COMPARISON:  CT scan dated 07/13/2018 and chest x-ray dated 10/04/2018 FINDINGS: Lower chest: Patient has developed a moderate right pleural effusion, increased since 09/04/2018, and a small left pleural effusion. There is compressive atelectasis in the right lower lobe. Heart size is normal. Coronary artery calcifications. Hepatobiliary: No focal liver abnormality is seen. No gallstones, gallbladder wall thickening, or biliary dilatation. Pancreas: Unremarkable. No pancreatic ductal dilatation or surrounding inflammatory changes. Spleen: Normal in size without focal abnormality. Adrenals/Urinary Tract: Adrenal glands are unremarkable. Kidneys are normal, without renal calculi, focal lesion, or hydronephrosis. Bladder is unremarkable. Stomach/Bowel: There is a right inguinal hernia containing fluid and a small loop of small bowel. No dilatation of the bowel within, proximal, or distal to the hernia. This is new since the prior study. There is also a small left inguinal hernia containing only fluid. The bowel otherwise appears normal. Appendix has been removed. Vascular/Lymphatic: Extensive aortic atherosclerosis. No adenopathy. Reproductive: Prostate gland is unremarkable. Large left hydrocele. Small right hydrocele. Other: Small amount of ascites around the liver and in the pericolic gutters and in the pelvis. Diffuse mild anasarca, increased since the prior study.  Musculoskeletal: No acute abnormality. IMPRESSION: 1. New right inguinal hernia containing fluid and a small loop of small bowel without obstruction. No evidence of inguinal or retroperitoneal or intra-abdominal hematoma. 2. Small left inguinal hernia containing only fluid. 3. Increased bilateral pleural effusions, right greater than left. 4. Small amount of ascites.   Increased anasarca. Aortic Atherosclerosis (ICD10-I70.0). Electronically Signed   By: Lorriane Shire M.D.   On: 10/10/2018 14:57   Dg Chest Port 1 View  Result Date: 10/10/2018 CLINICAL DATA:  Wheezing EXAM: PORTABLE CHEST 1 VIEW COMPARISON:  10/04/2018 FINDINGS: Cardiac shadow is mildly enlarged but stable. Postsurgical changes are seen. Enlarging right-sided pleural effusion is noted with underlying atelectatic changes. The left lung is clear. IMPRESSION: Increasing right-sided pleural effusion with underlying atelectatic changes. Electronically Signed   By: Inez Catalina M.D.   On: 10/10/2018 23:55   Scheduled Meds: . diltiazem  120 mg Oral Daily  . dutasteride  0.5 mg Oral Daily  . ezetimibe  10 mg Oral Daily  . furosemide  40 mg Intravenous Once  . pantoprazole  40 mg Oral BID  . potassium chloride  40 mEq Oral Q4H  . pravastatin  40 mg Oral q1800  . ranolazine  500 mg Oral BID  . sucralfate  1 g Oral Q6H  . vitamin E  400 Units Oral Daily   Continuous Infusions: . sodium chloride 250 mL (10/10/18 1648)   PRN Meds:.sodium chloride, acetaminophen **OR** acetaminophen, calcium carbonate, ipratropium-albuterol, ondansetron **OR** ondansetron (ZOFRAN) IV   ASSESMENT:   *   Blood loss anemia Dark stool, FOBT negative 8/5.  So ? Role of GI bleeding?  Hgb 6.7 >> 9.1 after 2 PRBCs  *    07/2017 EGD with esoph ulcer, esoph stenosis, pyloric stenosis. Continues BID PPI and QID carafate.    *    Chronic Eliquis, last dose 8/4 PM.    *   Right pleural effusion.  Dyspnea.   *    Hypokalemia.  Persists.    *     AKI improved.     *   New right inguinal hernia, non-obstructive/asymptomatic, new anasarca without cirrhosis, stable left inguinal hernia on CT.    *   Presence of Ab in blood make for challenging type and cross for transfusions.    *   ASPVD.  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  Post procedure pseudoaneurysm and then hematoma resolved.      PLAN   *   EGD, colonoscopy, timing  per MD.  Allow [place] for now.   Needs more time to improve resp status.     Azucena Freed  10/11/2018, 8:23 AM Phone [phone]

## 2018-10-12 ENCOUNTER — Encounter (HOSPITAL_COMMUNITY): Payer: Self-pay | Admitting: Student

## 2018-10-12 ENCOUNTER — Inpatient Hospital Stay (HOSPITAL_COMMUNITY): Payer: Medicare Other

## 2018-10-12 DIAGNOSIS — R1310 Dysphagia, unspecified: Secondary | ICD-10-CM

## 2018-10-12 DIAGNOSIS — K2210 Ulcer of esophagus without bleeding: Secondary | ICD-10-CM

## 2018-10-12 DIAGNOSIS — D500 Iron deficiency anemia secondary to blood loss (chronic): Secondary | ICD-10-CM

## 2018-10-12 HISTORY — PX: IR THORACENTESIS RIGHT ASP PLEURAL SPACE W/IMG GUIDE: IMG5380

## 2018-10-12 LAB — CBC
HCT: 25.8 % — ABNORMAL LOW (ref 39.0–52.0)
Hemoglobin: 9 g/dL — ABNORMAL LOW (ref 13.0–17.0)
MCH: 28.8 pg (ref 26.0–34.0)
MCHC: 34.9 g/dL (ref 30.0–36.0)
MCV: 82.4 fL (ref 80.0–100.0)
Platelets: 139 10*3/uL — ABNORMAL LOW (ref 150–400)
RBC: 3.13 MIL/uL — ABNORMAL LOW (ref 4.22–5.81)
RDW: 15.3 % (ref 11.5–15.5)
WBC: 6.7 10*3/uL (ref 4.0–10.5)
nRBC: 0 % (ref 0.0–0.2)

## 2018-10-12 LAB — BASIC METABOLIC PANEL
Anion gap: 10 (ref 5–15)
BUN: 29 mg/dL — ABNORMAL HIGH (ref 8–23)
CO2: 29 mmol/L (ref 22–32)
Calcium: 8.4 mg/dL — ABNORMAL LOW (ref 8.9–10.3)
Chloride: 87 mmol/L — ABNORMAL LOW (ref 98–111)
Creatinine, Ser: 1.64 mg/dL — ABNORMAL HIGH (ref 0.61–1.24)
Glucose, Bld: 114 mg/dL — ABNORMAL HIGH (ref 70–99)
Potassium: 3.4 mmol/L — ABNORMAL LOW (ref 3.5–5.1)
Sodium: 126 mmol/L — ABNORMAL LOW (ref 135–145)

## 2018-10-12 LAB — LACTATE DEHYDROGENASE: LDH: 190 U/L (ref 98–192)

## 2018-10-12 LAB — PROTEIN, TOTAL: Total Protein: 6 g/dL — ABNORMAL LOW (ref 6.5–8.1)

## 2018-10-12 IMAGING — US IR THORACENTESIS ASP PLEURAL SPACE W/IMG GUIDE
1 series · 3 of 3 positions shown · non-contrast
Comparison: none

INDICATION: Patient presents with volume overload, bilateral pleural effusions.
Request is made for therapeutic thoracentesis.

[Series 1: ir (id) (id)/(id)/(id) ir · 3 of 3 slices shown]
[im 1/3]
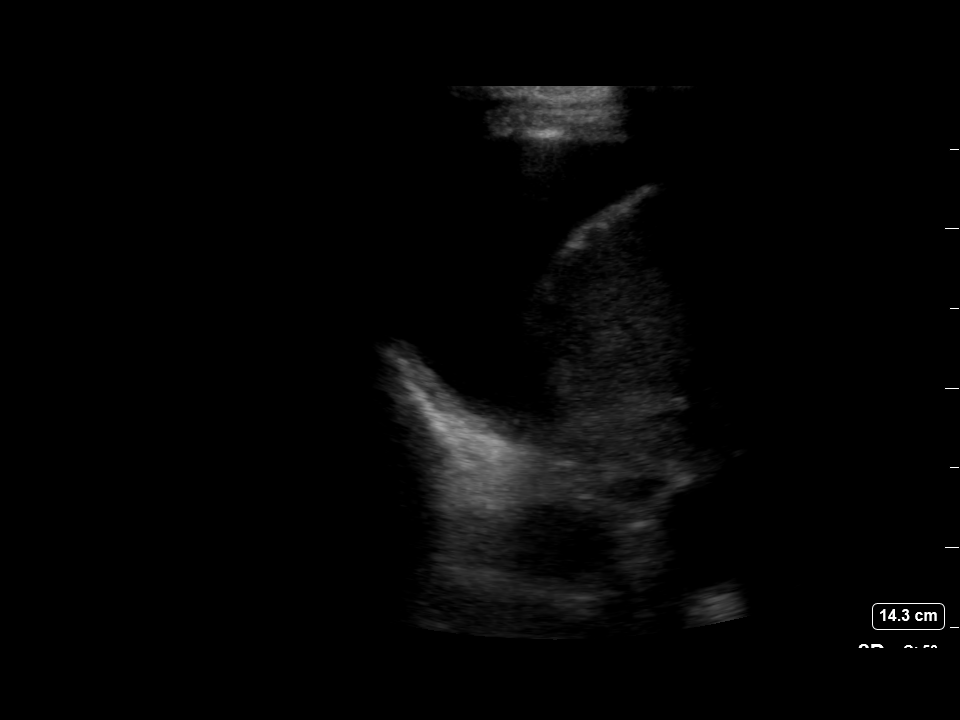
[im 2/3]
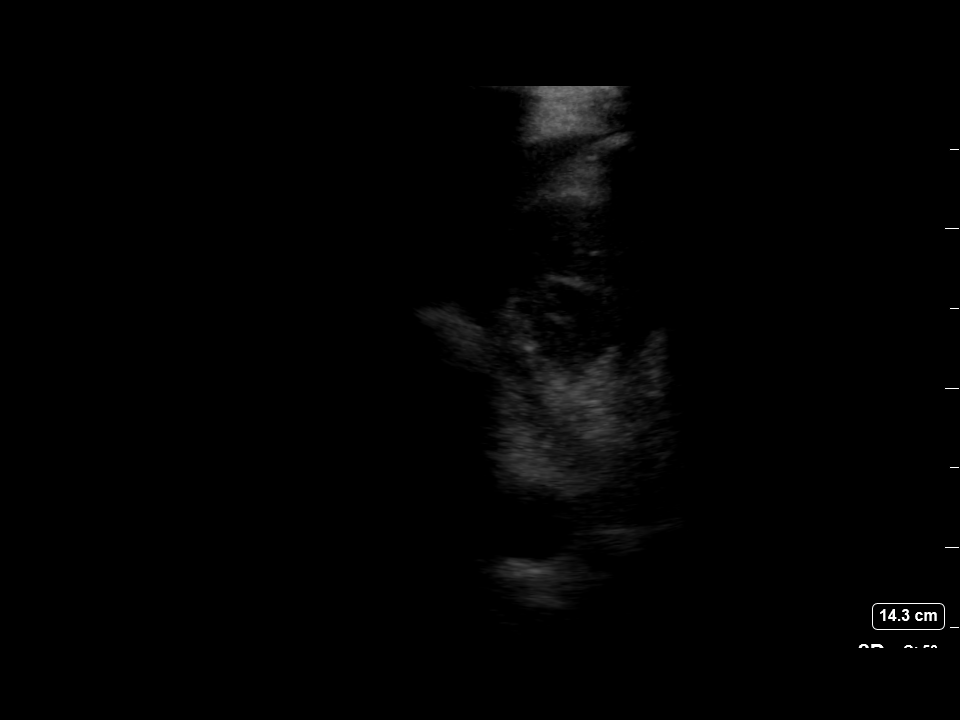
[im 3/3]
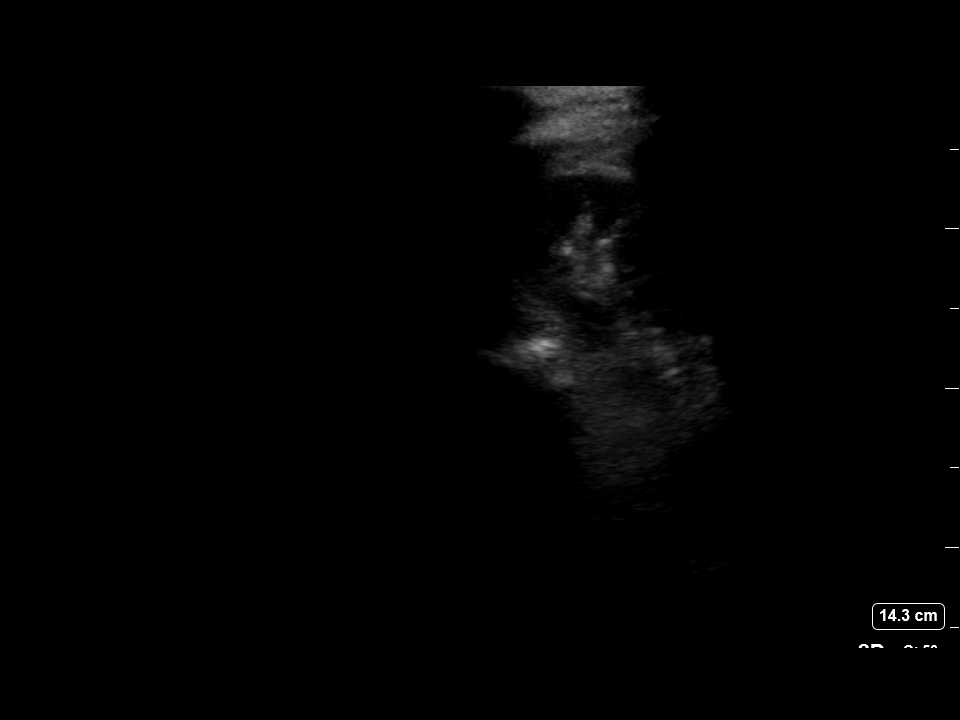

[3 of 3 positions shown; findings below may reference images not displayed]

EXAM:
ULTRASOUND GUIDED THERAPEUTIC RIGHT THORACENTESIS

MEDICATIONS:
10 mL 1% lidocaine

COMPLICATIONS:
None immediate.

PROCEDURE:
An ultrasound guided thoracentesis was thoroughly discussed with the
patient and questions answered. The benefits, risks, alternatives
and complications were also discussed. The patient understands and
wishes to proceed with the procedure. Written consent was obtained.

Ultrasound was performed to localize and mark an adequate pocket of
fluid in the right chest. The area was then prepped and draped in
the normal sterile fashion. 1% Lidocaine was used for local
anesthesia. Under ultrasound guidance a 6 Fr Safe-T-Centesis
catheter was introduced. Thoracentesis was performed. The catheter
was removed and a dressing applied.
FINDINGS: A total of approximately 1.7 liters of yellow, hazy fluid was
removed. Samples were sent to the laboratory as requested by the
clinical team.
IMPRESSION: Successful ultrasound guided therapeutic right thoracentesis
yielding 1.7 liters of pleural fluid.

## 2018-10-12 MED ORDER — LIDOCAINE HCL 1 % IJ SOLN
INTRAMUSCULAR | Status: AC
  Filled 2018-10-12: qty 20

## 2018-10-12 MED ORDER — POTASSIUM CHLORIDE CRYS ER 20 MEQ PO TBCR
40.0000 meq | EXTENDED_RELEASE_TABLET | Freq: Once | ORAL | Status: AC
  Administered 2018-10-12: 40 meq via ORAL
  Filled 2018-10-12: qty 2

## 2018-10-12 MED ORDER — GABAPENTIN 600 MG PO TABS
600.0000 mg | ORAL_TABLET | Freq: Every day | ORAL | Status: DC
  Administered 2018-10-12 – 2018-10-14 (×3): 600 mg via ORAL
  Filled 2018-10-12 (×3): qty 1

## 2018-10-12 MED ORDER — LIDOCAINE HCL (PF) 1 % IJ SOLN
INTRAMUSCULAR | Status: DC | PRN
  Administered 2018-10-12: 5 mL

## 2018-10-12 NOTE — Plan of Care (Signed)

## 2018-10-12 NOTE — Procedures (Signed)
PROCEDURE SUMMARY:  Successful US guided therapeutic right thoracentesis. Yielded 1.7 liters of hazy, yellow fluid. Pt tolerated procedure well. No immediate complications.  Specimen was not sent for labs. CXR ordered.  EBL < 5 mL  Docia Barrier PA-C 10/12/2018 11:49 AM

## 2018-10-12 NOTE — Progress Notes (Signed)
Daily Rounding Note  10/12/2018, 9:58 AM  LOS: 1 day   SUBJECTIVE:   Chief complaint: Blood loss anemia.  Patient did not sleep well due to restless leg activity.  His PMD just gave him samples from the office for restless leg, he used this medication once and it was helpful.  He does not recall the name of the medication.  He will call his wife and find out what it is in case it can be prescribed here at the hospital.  Breathing is better.  OBJECTIVE:         Vital signs in last 24 hours:    Temp:  [97.7 F (36.5 C)-98.4 F (36.9 C)] 98.3 F (36.8 C) (08/07 0604) Pulse Rate:  [77-86] 86 (08/07 0604) Resp:  [16-18] 16 (08/07 0604) BP: (103-122)/(66-77) 115/73 (08/07 0604) SpO2:  [90 %-100 %] 93 % (08/07 0604) Weight:  [64.3 kg] 64.3 kg (08/07 0604) Last BM Date: 10/11/18 Filed Weights   10/10/18 0351 10/11/18 0050 10/12/18 0604  Weight: 62.6 kg 65 kg 64.3 kg   General: Looks better still tired and overall chronically ill, weak looking..  Not dyspneic. Heart: Irregularly irregular.  Rate controlled. Chest: Diminished but clear breath sounds.  No labored breathing or cough. Abdomen: Soft.  Not tender or distended.  Active bowel sounds. Extremities: No CCE. Neuro/Psych: No tremor, no involuntary movement of the limbs.  Oriented x3.  Fluid speech.  Calm.  Intake/Output from previous day: 08/06 0701 - 08/07 0700 In: 1200 [P.O.:1200] Out: 2626 [Urine:2625; Stool:1]  Intake/Output this shift: No intake/output data recorded.  Lab Results: Recent Labs    10/10/18 0528 10/11/18 0154 10/12/18 0520  WBC 6.5 6.0 6.7  HGB 6.7* 9.1* 9.0*  HCT 20.7* 27.0* 25.8*  PLT 168 160 139*   BMET Recent Labs    10/11/18 0154 10/11/18 1505 10/12/18 0520  NA 123* 125* 126*  K 2.9* 3.1* 3.4*  CL 81* 85* 87*  CO2 29 27 29   GLUCOSE 112* 142* 114*  BUN 37* 32* 29*  CREATININE 1.53* 1.53* 1.64*  CALCIUM 8.1* 8.2* 8.4*    LFT Recent Labs    10/10/18 0528  PROT 5.7*  ALBUMIN 2.9*  AST 50*  ALT 31  ALKPHOS 112  BILITOT 0.8   PT/INR No results for input(s): LABPROT, INR in the last 72 hours. Hepatitis Panel No results for input(s): HEPBSAG, HCVAB, HEPAIGM, HEPBIGM in the last 72 hours.  Studies/Results: Ct Abdomen Pelvis Wo Contrast  Result Date: 10/10/2018 CLINICAL DATA:  Acute anemia. Bulge in the right groin. EXAM: CT ABDOMEN AND PELVIS WITHOUT CONTRAST TECHNIQUE: Multidetector CT imaging of the abdomen and pelvis was performed following the standard protocol without IV contrast. COMPARISON:  CT scan dated 07/13/2018 and chest x-ray dated 10/04/2018 FINDINGS: Lower chest: Patient has developed a moderate right pleural effusion, increased since 09/04/2018, and a small left pleural effusion. There is compressive atelectasis in the right lower lobe. Heart size is normal. Coronary artery calcifications. Hepatobiliary: No focal liver abnormality is seen. No gallstones, gallbladder wall thickening, or biliary dilatation. Pancreas: Unremarkable. No pancreatic ductal dilatation or surrounding inflammatory changes. Spleen: Normal in size without focal abnormality. Adrenals/Urinary Tract: Adrenal glands are unremarkable. Kidneys are normal, without renal calculi, focal lesion, or hydronephrosis. Bladder is unremarkable. Stomach/Bowel: There is a right inguinal hernia containing fluid and a small loop of small bowel. No dilatation of the bowel within, proximal, or distal to the hernia. This is new  since the prior study. There is also a small left inguinal hernia containing only fluid. The bowel otherwise appears normal. Appendix has been removed. Vascular/Lymphatic: Extensive aortic atherosclerosis. No adenopathy. Reproductive: Prostate gland is unremarkable. Large left hydrocele. Small right hydrocele. Other: Small amount of ascites around the liver and in the pericolic gutters and in the pelvis. Diffuse mild anasarca,  increased since the prior study. Musculoskeletal: No acute abnormality. IMPRESSION: 1. New right inguinal hernia containing fluid and a small loop of small bowel without obstruction. No evidence of inguinal or retroperitoneal or intra-abdominal hematoma. 2. Small left inguinal hernia containing only fluid. 3. Increased bilateral pleural effusions, right greater than left. 4. Small amount of ascites.  Increased anasarca. Aortic Atherosclerosis (ICD10-I70.0). Electronically Signed   By: Lorriane Shire M.D.   On: 10/10/2018 14:57   Dg Chest Port 1 View  Result Date: 10/12/2018 CLINICAL DATA:  73 year old male with a history of shortness of breath EXAM: PORTABLE CHEST 1 VIEW COMPARISON:  October 10, 2018 FINDINGS: Cardiomediastinal silhouette unchanged in size and contour with surgical changes of median sternotomy and CABG. Opacity at the right lung base with obscuration of the right hemidiaphragm and the right heart border. Blunting of the left costophrenic angle is new from the comparison. Coarsened interstitial markings, with interlobular septal thickening. No visualized pneumothorax. IMPRESSION: Persisting right basilar opacity likely a combination of pleural effusion with atelectasis/consolidation. New small left pleural effusion. Mild edema superimposed on chronic changes. Surgical changes of median sternotomy and CABG. Electronically Signed   By: Corrie Mckusick D.O.   On: 10/12/2018 09:02   Dg Chest Port 1 View  Result Date: 10/10/2018 CLINICAL DATA:  Wheezing EXAM: PORTABLE CHEST 1 VIEW COMPARISON:  10/04/2018 FINDINGS: Cardiac shadow is mildly enlarged but stable. Postsurgical changes are seen. Enlarging right-sided pleural effusion is noted with underlying atelectatic changes. The left lung is clear. IMPRESSION: Increasing right-sided pleural effusion with underlying atelectatic changes. Electronically Signed   By: Inez Catalina M.D.   On: 10/10/2018 23:55    ASSESMENT:   *   Blood loss anemia Dark  stool, FOBT negative 8/5.  So ? Role of GI bleeding?  Hgb 6.7 >> 9.1 >> 9 after 2 PRBCs  *    07/2017 EGD with esoph ulcer, esoph stenosis, pyloric stenosis. Continues BID PPI and QID carafate.    *    Chronic Eliquis, last dose 8/4 PM.    *   Right pleural effusion.  Dyspnea.   fup CXR with persistent R pleural effusion and atx, new small L pleural effusion.  For attempted thoracentesis today.    *    Hypokalemia.  Persists.    *     AKI, overall improved.     *   New right inguinal hernia, non-obstructive/asymptomatic, new anasarca without cirrhosis, stable left inguinal hernia on CT.    *   Presence of Ab in blood make for challenging type and cross for transfusions.    *   ASPVD.  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  Post procedure pseudoaneurysm and then hematoma resolved.      PLAN   *   EGD, colonoscopy.  Timing per MD.  These have been postponed in setting of resp distress, pleural effusion.   *   Thoracentesis ordered.         Azucena Freed  10/12/2018, 9:58 AM Phone [phone]

## 2018-10-12 NOTE — Progress Notes (Addendum)
PROGRESS NOTE    [name]  AGT:[id number] DOB: September 21, 1945 DOA: 10/10/2018 PCP: Garwin Brothers, MD   Brief Narrative:  HPI On 10/10/2018 by Dr. Jennette Kettle [name] is a 73 y.o. male with medical history significant of CAD s/p CABG and stents, A.Fib on Eliquis.  Esophageal strictures requiring dilation multiple times.  Chronic mesenteric ischemic s/p stenting back in May.  Also Anemia in May with finding of erosive esophagitis and esophageal ulcer that wasn't bleeding at time of endoscopy.  Patient was discharged on PPIs as well as Carafate through July.  Patient has had 2-3 days of generalized weakness, exercise intolerance.  Patient saw his PCP yesterday and had labs drawn.  His PCP called him up today and told him to "get in to the ED for 2 units of blood" he reports.  No CP, fever, sick contacts.  Denies any melena, hematochezia, emesis.  NO abdominal pain.  Interim history Admitted with symptomatic anemia and GI bleed. GI consulted. Course complicated by shortness of breath.  Assessment & Plan   Symptomatic anemia/GI bleed -presented with generalized weakness, exercise intolerance -Hemoglobin down to 6.7, was transfused 2 units PRBC -Hemoglobin today 9 -Continue to complain of shortness of breath -Patient recently had EGD on 07/26/2018: Nonbleeding esophageal ulcer, biopsied.  Benign-appearing esophageal stenosis.  Small hiatal hernia.  Gastric stenosis was found at the pylorus. Biopsy negative for malignancy and Candida. -CT A/P: No inguinal hernia, no evidence of retroperitoneal bleed.  Volume overload with bilateral pleural effusions and small volume ascites. -Gastroenterology consulted and appreciated, probable upper endoscopy and colonoscopy once hemoglobin has improved and respiratory status/ volume status improves -Continue protonix 40mg  PO BID, carafate   Acute dsypnea/ Acute on chronic possibly CHF exacerbation/ Right pleural effusion -Possibly secondary to fluid  overload given that patient received 2 units PRBC as well as IV fluids and IV medications -Chest x-ray yesterday evening reviewed and shows right-sided pleural effusion, no infection -has been given several doses of lasix- however given AKI, will hold lasix today -last Echocardiogram June 2020- showed EF 22-48%, diastolic parameters were indeterminate -Upon review of chart, patient did have EF 45% on catheretization   -monitor intake/output, daily weights -Continue supplemental oxygen -Obtained repeat CXR today, appears mildly improved when compared to 8/5 CXR. R pleural effusion -Will order US guided thoracentesis, with labs  Acute kidney injury -Creatinine on admission 1.93.  Baseline approximately 0.9 -Creatinine down to 1.64 -Likely secondary to the above -Continue to monitor BMP  Paroxysmal atrial fibrillation -Eliquis held -Continue Cardizem  Essential hypertension -Continue Cardizem  Hypokalemia -Potassium mildly improved to 3.4 -Continue to replace and monitor  -Magnesium 2.1   Hyponatremia/Hypochloremia -possibly due to diuresis, mildly improved -Continue to monitor   DVT Prophylaxis  SCDs  Code Status: Full  Family Communication: None at bedside  Disposition Plan: Admitted. Pending improvement in respiratory status and GI workup. Home when stable  Consultants Gastroenterology   Procedures  None  Antibiotics   Anti-infectives (From admission, onward)   None      Subjective:   [place] seen and examined today.  Patient feels breathing has mildly improved but continues to have shortness of breath when speaking. Denies chest pain, abdominal pain, nausea, vomiting, diarrhea, constipation, dizziness, headache.   Objective:   Vitals:   10/11/18 1753 10/11/18 2135 10/12/18 0032 10/12/18 0604  BP: 113/77 122/67 103/66 115/73  Pulse: 79 83 77 86  Resp: 18 17 16 16   Temp: 97.7 F (36.5 C) 97.8 F (  36.6 C) 98.4 F (36.9 C) 98.3 F (36.8 C)  TempSrc:  Oral Oral Oral Oral  SpO2: 97% 100% 90% 93%  Weight:    64.3 kg  Height:        Intake/Output Summary (Last 24 hours) at 10/12/2018 0845 Last data filed at 10/12/2018 0500 Gross per 24 hour  Intake 1200 ml  Output 2626 ml  Net -1426 ml   Filed Weights   10/10/18 0351 10/11/18 0050 10/12/18 0604  Weight: 62.6 kg 65 kg 64.3 kg   Exam  General: Well developed, well nourished, NAD, appears stated age  HEENT: NCAT, mucous membranes moist.   Cardiovascular: S1 S2 auscultated, irregular, SEM  Respiratory: Diminished breath sounds, particularly R base.   Abdomen: Soft, nontender, nondistended, + bowel sounds  Extremities: warm dry without cyanosis clubbing. LLE > RLE edema  Neuro: AAOx3, nonfocal  Psych: Appropriate mood and affect  Data Reviewed: I have personally reviewed following labs and imaging studies  CBC: Recent Labs  Lab 10/10/18 0528 10/11/18 0154 10/12/18 0520  WBC 6.5 6.0 6.7  HGB 6.7* 9.1* 9.0*  HCT 20.7* 27.0* 25.8*  MCV 85.9 83.6 82.4  PLT 168 160 470*   Basic Metabolic Panel: Recent Labs  Lab 10/10/18 0528 10/11/18 0154 10/11/18 1505 10/12/18 0520  NA 128* 123* 125* 126*  K 2.4* 2.9* 3.1* 3.4*  CL 87* 81* 85* 87*  CO2 26 29 27 29   GLUCOSE 145* 112* 142* 114*  BUN 44* 37* 32* 29*  CREATININE 1.93* 1.53* 1.53* 1.64*  CALCIUM 8.2* 8.1* 8.2* 8.4*  MG 2.1  --   --   --    GFR: Estimated Creatinine Clearance: 34.4 mL/min (A) (by C-G formula based on SCr of 1.64 mg/dL (H)). Liver Function Tests: Recent Labs  Lab 10/10/18 0528  AST 50*  ALT 31  ALKPHOS 112  BILITOT 0.8  PROT 5.7*  ALBUMIN 2.9*   No results for input(s): LIPASE, AMYLASE in the last 168 hours. No results for input(s): AMMONIA in the last 168 hours. Coagulation Profile: No results for input(s): INR, PROTIME in the last 168 hours. Cardiac Enzymes: No results for input(s): CKTOTAL, CKMB, CKMBINDEX, TROPONINI in the last 168 hours. BNP (last 3 results) No results for  input(s): PROBNP in the last 8760 hours. HbA1C: No results for input(s): HGBA1C in the last 72 hours. CBG: No results for input(s): GLUCAP in the last 168 hours. Lipid Profile: No results for input(s): CHOL, HDL, LDLCALC, TRIG, CHOLHDL, LDLDIRECT in the last 72 hours. Thyroid Function Tests: No results for input(s): TSH, T4TOTAL, FREET4, T3FREE, THYROIDAB in the last 72 hours. Anemia Panel: No results for input(s): VITAMINB12, FOLATE, FERRITIN, TIBC, IRON, RETICCTPCT in the last 72 hours. Urine analysis:    Component Value Date/Time   COLORURINE YELLOW 07/24/2018 0108   APPEARANCEUR CLEAR 07/24/2018 0108   LABSPEC 1.017 07/24/2018 0108   PHURINE 6.0 07/24/2018 0108   GLUCOSEU NEGATIVE 07/24/2018 0108   HGBUR NEGATIVE 07/24/2018 0108   BILIRUBINUR NEGATIVE 07/24/2018 0108   KETONESUR NEGATIVE 07/24/2018 0108   PROTEINUR 30 (A) 07/24/2018 0108   NITRITE NEGATIVE 07/24/2018 0108   LEUKOCYTESUR NEGATIVE 07/24/2018 0108   Sepsis Labs: @LABRCNTIP (procalcitonin:4,lacticidven:4)  ) Recent Results (from the past 240 hour(s))  SARS Coronavirus 2 [facility] order, Performed in [place] hospital lab)     Status: None   Collection Time: 10/10/18  4:09 AM  Result Value Ref Range Status   SARS Coronavirus 2 NEGATIVE NEGATIVE Final    Comment: (NOTE) If  result is NEGATIVE SARS-CoV-2 target nucleic acids are NOT DETECTED. The SARS-CoV-2 RNA is generally detectable in upper and lower  respiratory specimens during the acute phase of infection. The lowest  concentration of SARS-CoV-2 viral copies this assay can detect is 250  copies / mL. A negative result does not preclude SARS-CoV-2 infection  and should not be used as the sole basis for treatment or other  patient management decisions.  A negative result may occur with  improper specimen collection / handling, submission of specimen other  than nasopharyngeal swab, presence of viral mutation(s) within the  areas targeted by this assay,  and inadequate number of viral copies  (<250 copies / mL). A negative result must be combined with clinical  observations, patient history, and epidemiological information. If result is POSITIVE SARS-CoV-2 target nucleic acids are DETECTED. The SARS-CoV-2 RNA is generally detectable in upper and lower  respiratory specimens dur ing the acute phase of infection.  Positive  results are indicative of active infection with SARS-CoV-2.  Clinical  correlation with patient history and other diagnostic information is  necessary to determine patient infection status.  Positive results do  not rule out bacterial infection or co-infection with other viruses. If result is PRESUMPTIVE POSTIVE SARS-CoV-2 nucleic acids MAY BE PRESENT.   A presumptive positive result was obtained on the submitted specimen  and confirmed on repeat testing.  While 2019 novel coronavirus  (SARS-CoV-2) nucleic acids may be present in the submitted sample  additional confirmatory testing may be necessary for epidemiological  and / or clinical management purposes  to differentiate between  SARS-CoV-2 and other Sarbecovirus currently known to infect humans.  If clinically indicated additional testing with an alternate test  methodology [phone]) is advised. The SARS-CoV-2 RNA is generally  detectable in upper and lower respiratory sp ecimens during the acute  phase of infection. The expected result is Negative. Fact Sheet for Patients:  StrictlyIdeas.no Fact Sheet for Healthcare Providers: BankingDealers.co.za This test is not yet approved or cleared by the [place] FDA and has been authorized for detection and/or diagnosis of SARS-CoV-2 by FDA under an Emergency Use Authorization (EUA).  This EUA will remain in effect (meaning this test can be used) for the duration of the COVID-19 declaration under Section 564(b)(1) of the Act, 21 U.S.C. section 360bbb-3(b)(1), unless  the authorization is terminated or revoked sooner. Performed at [facility], [place] [address] [place], [place] [address]       Radiology Studies: Ct Abdomen Pelvis Wo Contrast  Result Date: 10/10/2018 CLINICAL DATA:  Acute anemia. Bulge in the right groin. EXAM: CT ABDOMEN AND PELVIS WITHOUT CONTRAST TECHNIQUE: Multidetector CT imaging of the abdomen and pelvis was performed following the standard protocol without IV contrast. COMPARISON:  CT scan dated 07/13/2018 and chest x-ray dated 10/04/2018 FINDINGS: Lower chest: Patient has developed a moderate right pleural effusion, increased since 09/04/2018, and a small left pleural effusion. There is compressive atelectasis in the right lower lobe. Heart size is normal. Coronary artery calcifications. Hepatobiliary: No focal liver abnormality is seen. No gallstones, gallbladder wall thickening, or biliary dilatation. Pancreas: Unremarkable. No pancreatic ductal dilatation or surrounding inflammatory changes. Spleen: Normal in size without focal abnormality. Adrenals/Urinary Tract: Adrenal glands are unremarkable. Kidneys are normal, without renal calculi, focal lesion, or hydronephrosis. Bladder is unremarkable. Stomach/Bowel: There is a right inguinal hernia containing fluid and a small loop of small bowel. No dilatation of the bowel within, proximal, or distal to the hernia. This is new  since the prior study. There is also a small left inguinal hernia containing only fluid. The bowel otherwise appears normal. Appendix has been removed. Vascular/Lymphatic: Extensive aortic atherosclerosis. No adenopathy. Reproductive: Prostate gland is unremarkable. Large left hydrocele. Small right hydrocele. Other: Small amount of ascites around the liver and in the pericolic gutters and in the pelvis. Diffuse mild anasarca, increased since the prior study. Musculoskeletal: No acute abnormality. IMPRESSION: 1. New right inguinal hernia containing fluid and a small loop  of small bowel without obstruction. No evidence of inguinal or retroperitoneal or intra-abdominal hematoma. 2. Small left inguinal hernia containing only fluid. 3. Increased bilateral pleural effusions, right greater than left. 4. Small amount of ascites.  Increased anasarca. Aortic Atherosclerosis (ICD10-I70.0). Electronically Signed   By: Lorriane Shire M.D.   On: 10/10/2018 14:57   Dg Chest Port 1 View  Result Date: 10/10/2018 CLINICAL DATA:  Wheezing EXAM: PORTABLE CHEST 1 VIEW COMPARISON:  10/04/2018 FINDINGS: Cardiac shadow is mildly enlarged but stable. Postsurgical changes are seen. Enlarging right-sided pleural effusion is noted with underlying atelectatic changes. The left lung is clear. IMPRESSION: Increasing right-sided pleural effusion with underlying atelectatic changes. Electronically Signed   By: Inez Catalina M.D.   On: 10/10/2018 23:55     Scheduled Meds:  diltiazem  120 mg Oral Daily   dutasteride  0.5 mg Oral Daily   ezetimibe  10 mg Oral Daily   pantoprazole  40 mg Oral BID   pravastatin  40 mg Oral q1800   ranolazine  500 mg Oral BID   senna-docusate  1 tablet Oral QHS   sucralfate  1 g Oral Q6H   vitamin E  400 Units Oral Daily   Continuous Infusions:  sodium chloride 250 mL (10/10/18 1648)     LOS: 1 day   Time Spent in minutes   45 minutes   Mikhaela Zaugg D.O. on 10/12/2018 at 8:45 AM  Between 7am to 7pm - Please see pager noted on [email or web address]  After 7pm go to [email or web address]  And look for the night coverage person covering for me after hours  [facility] Office  [phone]

## 2018-10-13 ENCOUNTER — Inpatient Hospital Stay (HOSPITAL_COMMUNITY): Payer: Medicare Other

## 2018-10-13 LAB — CBC
HCT: 25.4 % — ABNORMAL LOW (ref 39.0–52.0)
Hemoglobin: 8.7 g/dL — ABNORMAL LOW (ref 13.0–17.0)
MCH: 28.5 pg (ref 26.0–34.0)
MCHC: 34.3 g/dL (ref 30.0–36.0)
MCV: 83.3 fL (ref 80.0–100.0)
Platelets: 130 10*3/uL — ABNORMAL LOW (ref 150–400)
RBC: 3.05 MIL/uL — ABNORMAL LOW (ref 4.22–5.81)
RDW: 15.5 % (ref 11.5–15.5)
WBC: 6.8 10*3/uL (ref 4.0–10.5)
nRBC: 0 % (ref 0.0–0.2)

## 2018-10-13 LAB — GLUCOSE, PLEURAL OR PERITONEAL FLUID: Glucose, Fluid: 123 mg/dL

## 2018-10-13 LAB — GRAM STAIN

## 2018-10-13 LAB — LACTATE DEHYDROGENASE, PLEURAL OR PERITONEAL FLUID: LD, Fluid: 64 U/L — ABNORMAL HIGH (ref 3–23)

## 2018-10-13 LAB — BASIC METABOLIC PANEL
Anion gap: 10 (ref 5–15)
BUN: 23 mg/dL (ref 8–23)
CO2: 28 mmol/L (ref 22–32)
Calcium: 8.4 mg/dL — ABNORMAL LOW (ref 8.9–10.3)
Chloride: 89 mmol/L — ABNORMAL LOW (ref 98–111)
Creatinine, Ser: 1.3 mg/dL — ABNORMAL HIGH (ref 0.61–1.24)
GFR calc Af Amer: >60 mL/min (ref >60)
GFR calc non Af Amer: 54 mL/min — ABNORMAL LOW (ref >60)
Glucose, Bld: 104 mg/dL — ABNORMAL HIGH (ref 70–99)
Potassium: 3.8 mmol/L (ref 3.5–5.1)
Sodium: 127 mmol/L — ABNORMAL LOW (ref 135–145)

## 2018-10-13 LAB — PROTEIN, PLEURAL OR PERITONEAL FLUID: Total protein, fluid: <3 g/dL

## 2018-10-13 MED ORDER — PEG-KCL-NACL-NASULF-NA ASC-C 100 G PO SOLR
0.5000 | Freq: Once | ORAL | Status: AC
  Administered 2018-10-13: 100 g via ORAL
  Filled 2018-10-13 (×2): qty 1

## 2018-10-13 MED ORDER — LIDOCAINE HCL (PF) 1 % IJ SOLN
INTRAMUSCULAR | Status: AC
  Filled 2018-10-13: qty 30

## 2018-10-13 MED ORDER — BISACODYL 5 MG PO TBEC
20.0000 mg | DELAYED_RELEASE_TABLET | Freq: Once | ORAL | Status: AC
  Administered 2018-10-13: 20 mg via ORAL
  Filled 2018-10-13: qty 4

## 2018-10-13 MED ORDER — PEG-KCL-NACL-NASULF-NA ASC-C 100 G PO SOLR: 1.0000 | Freq: Once | ORAL | Status: DC

## 2018-10-13 MED ORDER — METOCLOPRAMIDE HCL 5 MG/ML IJ SOLN
10.0000 mg | Freq: Once | INTRAMUSCULAR | Status: AC
  Administered 2018-10-13: 10 mg via INTRAVENOUS
  Filled 2018-10-13: qty 2

## 2018-10-13 MED ORDER — PEG-KCL-NACL-NASULF-NA ASC-C 100 G PO SOLR
0.5000 | Freq: Once | ORAL | Status: AC
  Administered 2018-10-13: 100 g via ORAL
  Filled 2018-10-13: qty 1

## 2018-10-13 NOTE — Progress Notes (Signed)
   Vital Signs MEWS/VS Documentation      10/13/2018 0912 10/13/2018 0946 10/13/2018 1048 10/13/2018 1057   MEWS Score:  0  0  0  0   MEWS Score Color:  Green  Green  Green  Green   Resp:  -  -  -  20   Pulse:  -  -  -  85   BP:  102/61  109/61  118/66  118/66   Temp:  -  -  -  98 F (36.7 C)           Jerrico Covello N Tobias[place]Diakun 10/13/2018,2:08 PM

## 2018-10-13 NOTE — Procedures (Signed)
PROCEDURE SUMMARY:  Successful image-guided left thoracentesis. Yielded 650 milliliters of clear gold fluid. Patient tolerated procedure well. No immediate complications. EBL < 5 mL.  Specimen was sent for labs. CXR ordered.  Alexandra Louk PA-C 10/13/2018 10:01 AM

## 2018-10-13 NOTE — Progress Notes (Addendum)
PROGRESS NOTE    [name]  WUJ:[id number] DOB: February 26, 1946 DOA: 10/10/2018 PCP: Garwin Brothers, MD   Brief Narrative:  HPI On 10/10/2018 by Dr. Jennette Kettle [name] is a 73 y.o. male with medical history significant of CAD s/p CABG and stents, A.Fib on Eliquis.  Esophageal strictures requiring dilation multiple times.  Chronic mesenteric ischemic s/p stenting back in May.  Also Anemia in May with finding of erosive esophagitis and esophageal ulcer that wasn't bleeding at time of endoscopy.  Patient was discharged on PPIs as well as Carafate through July.  Patient has had 2-3 days of generalized weakness, exercise intolerance.  Patient saw his PCP yesterday and had labs drawn.  His PCP called him up today and told him to "get in to the ED for 2 units of blood" he reports.  No CP, fever, sick contacts.  Denies any melena, hematochezia, emesis.  NO abdominal pain.  Interim history Admitted with symptomatic anemia and GI bleed. GI consulted. Course complicated by shortness of breath, s/p right thoracentesis. Breathing mildly improved today.  Assessment & Plan   Symptomatic anemia/GI bleed -presented with generalized weakness, exercise intolerance -Hemoglobin down to 6.7, was transfused 2 units PRBC -Hemoglobin today 8.7 -Continue to complain of shortness of breath -Patient recently had EGD on 07/26/2018: Nonbleeding esophageal ulcer, biopsied.  Benign-appearing esophageal stenosis.  Small hiatal hernia.  Gastric stenosis was found at the pylorus. Biopsy negative for malignancy and Candida. -CT A/P: No inguinal hernia, no evidence of retroperitoneal bleed.  Volume overload with bilateral pleural effusions and small volume ascites. -Gastroenterology consulted and appreciated, probable upper endoscopy and colonoscopy once hemoglobin has improved and respiratory status/ volume status improves -Continue protonix 40mg  PO BID, carafate   Acute dsypnea/ Acute on chronic possibly CHF  exacerbation/ Right pleural effusion -Possibly secondary to fluid overload given that patient received 2 units PRBC as well as IV fluids and IV medications -Chest x-ray yesterday evening reviewed and shows right-sided pleural effusion, no infection -has been given several doses of lasix- however given AKI, will hold lasix today -last Echocardiogram June 2020- showed EF 95-62%, diastolic parameters were indeterminate -Upon review of chart, patient did have EF 45% on catheretization   -monitor intake/output, daily weights -Continue supplemental oxygen -Obtained repeat CXR today, appears mildly improved when compared to 8/5 CXR. R pleural effusion -s/p R US guided thoracentesis, yielding 1.7L  -breathing has mildly improved -will order L thoracentesis, with labs  Acute kidney injury -Creatinine on admission 1.93.  Baseline approximately 0.9 -Creatinine down to 1.30 -Likely secondary to the above -Continue to monitor BMP  Paroxysmal atrial fibrillation -Eliquis held -Continue Cardizem  Essential hypertension -Continue Cardizem  Hypokalemia -Potassium mildly improved to 3.8 -Continue to replace and monitor  -Magnesium 2.1   Hyponatremia/Hypochloremia -possibly due to diuresis, improving slowly -Continue to monitor BMP  DVT Prophylaxis  SCDs  Code Status: Full  Family Communication: None at bedside  Disposition Plan: Admitted. Pending improvement in respiratory status and GI workup. Home when stable  Consultants Gastroenterology   Procedures  R US guided thoracentesis   Antibiotics   Anti-infectives (From admission, onward)   None      Subjective:   [place] seen and examined today.  Feels breathing has mildly improved. Able to speak in full sentences without stopping. Denies current chest pain, abdominal pain, N/V/D/C.   Objective:   Vitals:   10/12/18 0604 10/12/18 1209 10/12/18 2003 10/13/18 0403  BP: 115/73 107/74 120/68 112/71  Pulse: 86 82  75 76    Resp: 16 18 18 18   Temp: 98.3 F (36.8 C) 98.5 F (36.9 C) 98.8 F (37.1 C) 98.7 F (37.1 C)  TempSrc: Oral Oral Oral Oral  SpO2: 93% 99% 100% 94%  Weight: 64.3 kg   63.2 kg  Height:        Intake/Output Summary (Last 24 hours) at 10/13/2018 0846 Last data filed at 10/13/2018 0400 Gross per 24 hour  Intake 840 ml  Output 750 ml  Net 90 ml   Filed Weights   10/11/18 0050 10/12/18 0604 10/13/18 0403  Weight: 65 kg 64.3 kg 63.2 kg   Exam  General: Well developed, well nourished, NAD, appears stated age  HEENT: NCAT, mucous membranes moist.   Cardiovascular: S1 S2 auscultated, irregular, SEM  Respiratory: Diminished but clear breath sounds  Abdomen: Soft, nontender, nondistended, + bowel sounds  Extremities: warm dry without cyanosis clubbing. LLE edema > RLE. +pedal edema  Neuro: AAOx3, nonfocal  Psych: Appropriate mood and affect, pleasant   Data Reviewed: I have personally reviewed following labs and imaging studies  CBC: Recent Labs  Lab 10/10/18 0528 10/11/18 0154 10/12/18 0520 10/13/18 0244  WBC 6.5 6.0 6.7 6.8  HGB 6.7* 9.1* 9.0* 8.7*  HCT 20.7* 27.0* 25.8* 25.4*  MCV 85.9 83.6 82.4 83.3  PLT 168 160 139* 893*   Basic Metabolic Panel: Recent Labs  Lab 10/10/18 0528 10/11/18 0154 10/11/18 1505 10/12/18 0520 10/13/18 0244  NA 128* 123* 125* 126* 127*  K 2.4* 2.9* 3.1* 3.4* 3.8  CL 87* 81* 85* 87* 89*  CO2 26 29 27 29 28   GLUCOSE 145* 112* 142* 114* 104*  BUN 44* 37* 32* 29* 23  CREATININE 1.93* 1.53* 1.53* 1.64* 1.30*  CALCIUM 8.2* 8.1* 8.2* 8.4* 8.4*  MG 2.1  --   --   --   --    GFR: Estimated Creatinine Clearance: 43.4 mL/min (A) (by C-G formula based on SCr of 1.3 mg/dL (H)). Liver Function Tests: Recent Labs  Lab 10/10/18 0528 10/12/18 0520  AST 50*  --   ALT 31  --   ALKPHOS 112  --   BILITOT 0.8  --   PROT 5.7* 6.0*  ALBUMIN 2.9*  --    No results for input(s): LIPASE, AMYLASE in the last 168 hours. No results for  input(s): AMMONIA in the last 168 hours. Coagulation Profile: No results for input(s): INR, PROTIME in the last 168 hours. Cardiac Enzymes: No results for input(s): CKTOTAL, CKMB, CKMBINDEX, TROPONINI in the last 168 hours. BNP (last 3 results) No results for input(s): PROBNP in the last 8760 hours. HbA1C: No results for input(s): HGBA1C in the last 72 hours. CBG: No results for input(s): GLUCAP in the last 168 hours. Lipid Profile: No results for input(s): CHOL, HDL, LDLCALC, TRIG, CHOLHDL, LDLDIRECT in the last 72 hours. Thyroid Function Tests: No results for input(s): TSH, T4TOTAL, FREET4, T3FREE, THYROIDAB in the last 72 hours. Anemia Panel: No results for input(s): VITAMINB12, FOLATE, FERRITIN, TIBC, IRON, RETICCTPCT in the last 72 hours. Urine analysis:    Component Value Date/Time   COLORURINE YELLOW 07/24/2018 0108   APPEARANCEUR CLEAR 07/24/2018 0108   LABSPEC 1.017 07/24/2018 0108   PHURINE 6.0 07/24/2018 0108   GLUCOSEU NEGATIVE 07/24/2018 0108   HGBUR NEGATIVE 07/24/2018 0108   BILIRUBINUR NEGATIVE 07/24/2018 0108   KETONESUR NEGATIVE 07/24/2018 0108   PROTEINUR 30 (A) 07/24/2018 0108   NITRITE NEGATIVE 07/24/2018 [place] 07/24/2018 0108  Sepsis Labs: @LABRCNTIP (procalcitonin:4,lacticidven:4)  ) Recent Results (from the past 240 hour(s))  SARS Coronavirus 2 [facility] order, Performed in [facility] hospital lab)     Status: None   Collection Time: 10/10/18  4:09 AM  Result Value Ref Range Status   SARS Coronavirus 2 NEGATIVE NEGATIVE Final    Comment: (NOTE) If result is NEGATIVE SARS-CoV-2 target nucleic acids are NOT DETECTED. The SARS-CoV-2 RNA is generally detectable in upper and lower  respiratory specimens during the acute phase of infection. The lowest  concentration of SARS-CoV-2 viral copies this assay can detect is 250  copies / mL. A negative result does not preclude SARS-CoV-2 infection  and should not be used as the sole  basis for treatment or other  patient management decisions.  A negative result may occur with  improper specimen collection / handling, submission of specimen other  than nasopharyngeal swab, presence of viral mutation(s) within the  areas targeted by this assay, and inadequate number of viral copies  (<250 copies / mL). A negative result must be combined with clinical  observations, patient history, and epidemiological information. If result is POSITIVE SARS-CoV-2 target nucleic acids are DETECTED. The SARS-CoV-2 RNA is generally detectable in upper and lower  respiratory specimens dur ing the acute phase of infection.  Positive  results are indicative of active infection with SARS-CoV-2.  Clinical  correlation with patient history and other diagnostic information is  necessary to determine patient infection status.  Positive results do  not rule out bacterial infection or co-infection with other viruses. If result is PRESUMPTIVE POSTIVE SARS-CoV-2 nucleic acids MAY BE PRESENT.   A presumptive positive result was obtained on the submitted specimen  and confirmed on repeat testing.  While 2019 novel coronavirus  (SARS-CoV-2) nucleic acids may be present in the submitted sample  additional confirmatory testing may be necessary for epidemiological  and / or clinical management purposes  to differentiate between  SARS-CoV-2 and other Sarbecovirus currently known to infect humans.  If clinically indicated additional testing with an alternate test  methodology [phone]) is advised. The SARS-CoV-2 RNA is generally  detectable in upper and lower respiratory sp ecimens during the acute  phase of infection. The expected result is Negative. Fact Sheet for Patients:  StrictlyIdeas.no Fact Sheet for Healthcare Providers: BankingDealers.co.za This test is not yet approved or cleared by the [place] FDA and has been authorized for detection  and/or diagnosis of SARS-CoV-2 by FDA under an Emergency Use Authorization (EUA).  This EUA will remain in effect (meaning this test can be used) for the duration of the COVID-19 declaration under Section 564(b)(1) of the Act, 21 U.S.C. section 360bbb-3(b)(1), unless the authorization is terminated or revoked sooner. Performed at [facility], [place] [address], [place], [place] [address]       Radiology Studies: Dg Chest 1 View  Result Date: 10/12/2018 CLINICAL DATA:  Thoracentesis. EXAM: CHEST  1 VIEW COMPARISON:  Earlier the same day FINDINGS: Right thoracentesis. There are now trace bilateral pleural effusions. No visible pneumothorax or re-expansion edema. Cardiomegaly. CABG. IMPRESSION: No complicating feature after thoracentesis. Electronically Signed   By: Monte Fantasia M.D.   On: 10/12/2018 10:23   Dg Chest Port 1 View  Result Date: 10/12/2018 CLINICAL DATA:  73 year old male with a history of shortness of breath EXAM: PORTABLE CHEST 1 VIEW COMPARISON:  October 10, 2018 FINDINGS: Cardiomediastinal silhouette unchanged in size and contour with surgical changes of median sternotomy and CABG. Opacity at the right lung  base with obscuration of the right hemidiaphragm and the right heart border. Blunting of the left costophrenic angle is new from the comparison. Coarsened interstitial markings, with interlobular septal thickening. No visualized pneumothorax. IMPRESSION: Persisting right basilar opacity likely a combination of pleural effusion with atelectasis/consolidation. New small left pleural effusion. Mild edema superimposed on chronic changes. Surgical changes of median sternotomy and CABG. Electronically Signed   By: Corrie Mckusick D.O.   On: 10/12/2018 09:02   Ir Thoracentesis Asp Pleural Space W/img Guide  Result Date: 10/12/2018 INDICATION: Patient presents with volume overload, bilateral pleural effusions. Request is made for therapeutic thoracentesis. EXAM: ULTRASOUND GUIDED  THERAPEUTIC RIGHT THORACENTESIS MEDICATIONS: 10 mL 1% lidocaine COMPLICATIONS: None immediate. PROCEDURE: An ultrasound guided thoracentesis was thoroughly discussed with the patient and questions answered. The benefits, risks, alternatives and complications were also discussed. The patient understands and wishes to proceed with the procedure. Written consent was obtained. Ultrasound was performed to localize and mark an adequate pocket of fluid in the right chest. The area was then prepped and draped in the normal sterile fashion. 1% Lidocaine was used for local anesthesia. Under ultrasound guidance a 6 Fr Safe-T-Centesis catheter was introduced. Thoracentesis was performed. The catheter was removed and a dressing applied. FINDINGS: A total of approximately 1.7 liters of yellow, hazy fluid was removed. Samples were sent to the laboratory as requested by the clinical team. IMPRESSION: Successful ultrasound guided therapeutic right thoracentesis yielding 1.7 liters of pleural fluid. Ready by: Brynda Greathouse PA-C Electronically Signed   By: Markus Daft M.D.   On: 10/12/2018 15:00     Scheduled Meds:  diltiazem  120 mg Oral Daily   dutasteride  0.5 mg Oral Daily   ezetimibe  10 mg Oral Daily   gabapentin  600 mg Oral QHS   pantoprazole  40 mg Oral BID   pravastatin  40 mg Oral q1800   ranolazine  500 mg Oral BID   senna-docusate  1 tablet Oral QHS   sucralfate  1 g Oral Q6H   vitamin E  400 Units Oral Daily   Continuous Infusions:  sodium chloride 250 mL (10/10/18 1648)     LOS: 2 days   Time Spent in minutes   30 minutes   Harvis Mabus D.O. on 10/13/2018 at 8:46 AM  Between 7am to 7pm - Please see pager noted on [email or web address]  After 7pm go to [email or web address]  And look for the night coverage person covering for me after hours  [facility] Office  [phone]

## 2018-10-13 NOTE — Progress Notes (Signed)
Patient did well today. Patient had a left lung tap with stable vitals. GI was consulted and prep started for EGD. Will be continuing second bottle at midnight.

## 2018-10-13 NOTE — Progress Notes (Addendum)
Daily Rounding Note  10/13/2018, 10:46 AM  LOS: 2 days   SUBJECTIVE:   Chief complaint: Blood loss anemia.  Dark stool. Breathing is feeling better.  Had right thoracentesis yesterday and left thoracentesis this morning. Leg cramps resolved. Has the name of the sample medication he was given for restless leg.  It is Horizant which is an extended release gabapentin.  OBJECTIVE:         Vital signs in last 24 hours:    Temp:  [98.5 F (36.9 C)-98.8 F (37.1 C)] 98.7 F (37.1 C) (08/08 0403) Pulse Rate:  [75-82] 76 (08/08 0403) Resp:  [18] 18 (08/08 0403) BP: (102-120)/(61-74) 109/61 (08/08 0946) SpO2:  [94 %-100 %] 94 % (08/08 0403) Weight:  [63.2 kg] 63.2 kg (08/08 0403) Last BM Date: 10/12/18 Filed Weights   10/11/18 0050 10/12/18 0604 10/13/18 0403  Weight: 65 kg 64.3 kg 63.2 kg   General: Looks better.  No labored breathing. Heart: RRR. Chest: Better breath sounds, no adventitious breath sounds.  No dyspnea.  No cough. Abdomen: Soft.  Not tender or distended.  Active bowel sounds. Extremities: CCE. Neuro/Psych: Calm, alert.  Fully oriented.  Good historian.  No gross deficits.  Intake/Output from previous day: 08/07 0701 - 08/08 0700 In: 840 [P.O.:840] Out: 750 [Urine:750]  Intake/Output this shift: No intake/output data recorded.  Lab Results: Recent Labs    10/11/18 0154 10/12/18 0520 10/13/18 0244  WBC 6.0 6.7 6.8  HGB 9.1* 9.0* 8.7*  HCT 27.0* 25.8* 25.4*  PLT 160 139* 130*   BMET Recent Labs    10/11/18 1505 10/12/18 0520 10/13/18 0244  NA 125* 126* 127*  K 3.1* 3.4* 3.8  CL 85* 87* 89*  CO2 27 29 28   GLUCOSE 142* 114* 104*  BUN 32* 29* 23  CREATININE 1.53* 1.64* 1.30*  CALCIUM 8.2* 8.4* 8.4*   LFT Recent Labs    10/12/18 0520  PROT 6.0*   PT/INR No results for input(s): LABPROT, INR in the last 72 hours. Hepatitis Panel No results for input(s): HEPBSAG, HCVAB, HEPAIGM,  HEPBIGM in the last 72 hours.  Studies/Results: Dg Chest 1 View  Result Date: 10/13/2018 CLINICAL DATA:  Status post left thoracentesis EXAM: CHEST  1 VIEW COMPARISON:  Chest radiograph from one day prior. FINDINGS: Intact sternotomy wires. CABG clips overlie the mediastinum. Stable cardiomediastinal silhouette with mild cardiomegaly. No pneumothorax. No residual left pleural effusion. Stable small right pleural effusion. Emphysema. No overt pulmonary edema. Mild right basilar scarring versus atelectasis. IMPRESSION: 1. No pneumothorax.  No residual left pleural effusion. 2. Stable small right pleural effusion. 3. Emphysema. 4. Mild right basilar scarring versus atelectasis. 5. Cardiomegaly. Electronically Signed   By: Ilona Sorrel M.D.   On: 10/13/2018 10:30   Dg Chest 1 View  Result Date: 10/12/2018 CLINICAL DATA:  Thoracentesis. EXAM: CHEST  1 VIEW COMPARISON:  Earlier the same day FINDINGS: Right thoracentesis. There are now trace bilateral pleural effusions. No visible pneumothorax or re-expansion edema. Cardiomegaly. CABG. IMPRESSION: No complicating feature after thoracentesis. Electronically Signed   By: Monte Fantasia M.D.   On: 10/12/2018 10:23   Dg Chest Port 1 View  Result Date: 10/12/2018 CLINICAL DATA:  73 year old male with a history of shortness of breath EXAM: PORTABLE CHEST 1 VIEW COMPARISON:  October 10, 2018 FINDINGS: Cardiomediastinal silhouette unchanged in size and contour with surgical changes of median sternotomy and CABG. Opacity at the right lung base with obscuration of the  right hemidiaphragm and the right heart border. Blunting of the left costophrenic angle is new from the comparison. Coarsened interstitial markings, with interlobular septal thickening. No visualized pneumothorax. IMPRESSION: Persisting right basilar opacity likely a combination of pleural effusion with atelectasis/consolidation. New small left pleural effusion. Mild edema superimposed on chronic changes.  Surgical changes of median sternotomy and CABG. Electronically Signed   By: Corrie Mckusick D.O.   On: 10/12/2018 09:02   Ir Thoracentesis Asp Pleural Space W/img Guide  Result Date: 10/12/2018 INDICATION: Patient presents with volume overload, bilateral pleural effusions. Request is made for therapeutic thoracentesis. EXAM: ULTRASOUND GUIDED THERAPEUTIC RIGHT THORACENTESIS MEDICATIONS: 10 mL 1% lidocaine COMPLICATIONS: None immediate. PROCEDURE: An ultrasound guided thoracentesis was thoroughly discussed with the patient and questions answered. The benefits, risks, alternatives and complications were also discussed. The patient understands and wishes to proceed with the procedure. Written consent was obtained. Ultrasound was performed to localize and mark an adequate pocket of fluid in the right chest. The area was then prepped and draped in the normal sterile fashion. 1% Lidocaine was used for local anesthesia. Under ultrasound guidance a 6 Fr Safe-T-Centesis catheter was introduced. Thoracentesis was performed. The catheter was removed and a dressing applied. FINDINGS: A total of approximately 1.7 liters of yellow, hazy fluid was removed. Samples were sent to the laboratory as requested by the clinical team. IMPRESSION: Successful ultrasound guided therapeutic right thoracentesis yielding 1.7 liters of pleural fluid. Ready by: Brynda Greathouse PA-C Electronically Signed   By: Markus Daft M.D.   On: 10/12/2018 15:00    Scheduled Meds:  diltiazem  120 mg Oral Daily   dutasteride  0.5 mg Oral Daily   ezetimibe  10 mg Oral Daily   gabapentin  600 mg Oral QHS   lidocaine (PF)       pantoprazole  40 mg Oral BID   pravastatin  40 mg Oral q1800   ranolazine  500 mg Oral BID   senna-docusate  1 tablet Oral QHS   sucralfate  1 g Oral Q6H   vitamin E  400 Units Oral Daily   Continuous Infusions:  sodium chloride 250 mL (10/10/18 1648)   PRN Meds:.sodium chloride, acetaminophen **OR**  acetaminophen, calcium carbonate, ipratropium-albuterol, lidocaine (PF), ondansetron **OR** ondansetron (ZOFRAN) IV   ASSESMENT:   *   Blood loss anemia Dark stool, FOBT negative 8/5.  So ? Role of GI bleeding?  Hgb 6.7 >> 9.1  >> 8.7 after 2 PRBCs Presence of Ab in blood make for challenging type and cross for transfusions.    *    07/2017 EGD with esoph ulcer, esoph stenosis, pyloric stenosis. Continues BID PPI and QID carafate.    *    Chronic Eliquis, last dose 8/4 PM.    *   Right pleural effusion.  Dyspnea.  1.7 L R paracentesis 10/12/2018.  650 ml L thora 10/13/18  *    Hypokalemia.    Resolved.  *    Noncritical hyponatremia, stable, improved.  *   New right inguinal hernia, non-obstructive/asymptomatic, new anasarca without cirrhosis, stable left inguinal hernia on CT.    *   ASPVD.  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  Post procedure pseudoaneurysm and then hematoma resolved.      PLAN   *    EGD, colonoscopy.  Set for 10 PM Tmrw  Dr. Bryan Lemma will see patient and confirm, but prep, diet ordered.        Azucena Freed  10/13/2018, 10:46 AM Phone [phone]  [phone] °

## 2018-10-13 NOTE — H&P (View-Only) (Signed)
Daily Rounding Note  10/13/2018, 10:46 AM  LOS: 2 days   SUBJECTIVE:   Chief complaint: Blood loss anemia.  Dark stool. Breathing is feeling better.  Had right thoracentesis yesterday and left thoracentesis this morning. Leg cramps resolved. Has the name of the sample medication he was given for restless leg.  It is Horizant which is an extended release gabapentin.  OBJECTIVE:         Vital signs in last 24 hours:    Temp:  [98.5 F (36.9 C)-98.8 F (37.1 C)] 98.7 F (37.1 C) (08/08 0403) Pulse Rate:  [75-82] 76 (08/08 0403) Resp:  [18] 18 (08/08 0403) BP: (102-120)/(61-74) 109/61 (08/08 0946) SpO2:  [94 %-100 %] 94 % (08/08 0403) Weight:  [63.2 kg] 63.2 kg (08/08 0403) Last BM Date: 10/12/18 Filed Weights   10/11/18 0050 10/12/18 0604 10/13/18 0403  Weight: 65 kg 64.3 kg 63.2 kg   General: Looks better.  No labored breathing. Heart: RRR. Chest: Better breath sounds, no adventitious breath sounds.  No dyspnea.  No cough. Abdomen: Soft.  Not tender or distended.  Active bowel sounds. Extremities: CCE. Neuro/Psych: Calm, alert.  Fully oriented.  Good historian.  No gross deficits.  Intake/Output from previous day: 08/07 0701 - 08/08 0700 In: 840 [P.O.:840] Out: 750 [Urine:750]  Intake/Output this shift: No intake/output data recorded.  Lab Results: Recent Labs    10/11/18 0154 10/12/18 0520 10/13/18 0244  WBC 6.0 6.7 6.8  HGB 9.1* 9.0* 8.7*  HCT 27.0* 25.8* 25.4*  PLT 160 139* 130*   BMET Recent Labs    10/11/18 1505 10/12/18 0520 10/13/18 0244  NA 125* 126* 127*  K 3.1* 3.4* 3.8  CL 85* 87* 89*  CO2 27 29 28   GLUCOSE 142* 114* 104*  BUN 32* 29* 23  CREATININE 1.53* 1.64* 1.30*  CALCIUM 8.2* 8.4* 8.4*   LFT Recent Labs    10/12/18 0520  PROT 6.0*   PT/INR No results for input(s): LABPROT, INR in the last 72 hours. Hepatitis Panel No results for input(s): HEPBSAG, HCVAB, HEPAIGM,  HEPBIGM in the last 72 hours.  Studies/Results: Dg Chest 1 View  Result Date: 10/13/2018 CLINICAL DATA:  Status post left thoracentesis EXAM: CHEST  1 VIEW COMPARISON:  Chest radiograph from one day prior. FINDINGS: Intact sternotomy wires. CABG clips overlie the mediastinum. Stable cardiomediastinal silhouette with mild cardiomegaly. No pneumothorax. No residual left pleural effusion. Stable small right pleural effusion. Emphysema. No overt pulmonary edema. Mild right basilar scarring versus atelectasis. IMPRESSION: 1. No pneumothorax.  No residual left pleural effusion. 2. Stable small right pleural effusion. 3. Emphysema. 4. Mild right basilar scarring versus atelectasis. 5. Cardiomegaly. Electronically Signed   By: Ilona Sorrel M.D.   On: 10/13/2018 10:30   Dg Chest 1 View  Result Date: 10/12/2018 CLINICAL DATA:  Thoracentesis. EXAM: CHEST  1 VIEW COMPARISON:  Earlier the same day FINDINGS: Right thoracentesis. There are now trace bilateral pleural effusions. No visible pneumothorax or re-expansion edema. Cardiomegaly. CABG. IMPRESSION: No complicating feature after thoracentesis. Electronically Signed   By: Monte Fantasia M.D.   On: 10/12/2018 10:23   Dg Chest Port 1 View  Result Date: 10/12/2018 CLINICAL DATA:  73 year old male with a history of shortness of breath EXAM: PORTABLE CHEST 1 VIEW COMPARISON:  October 10, 2018 FINDINGS: Cardiomediastinal silhouette unchanged in size and contour with surgical changes of median sternotomy and CABG. Opacity at the right lung base with obscuration of the  right hemidiaphragm and the right heart border. Blunting of the left costophrenic angle is new from the comparison. Coarsened interstitial markings, with interlobular septal thickening. No visualized pneumothorax. IMPRESSION: Persisting right basilar opacity likely a combination of pleural effusion with atelectasis/consolidation. New small left pleural effusion. Mild edema superimposed on chronic changes.  Surgical changes of median sternotomy and CABG. Electronically Signed   By: Corrie Mckusick D.O.   On: 10/12/2018 09:02   Ir Thoracentesis Asp Pleural Space W/img Guide  Result Date: 10/12/2018 INDICATION: Patient presents with volume overload, bilateral pleural effusions. Request is made for therapeutic thoracentesis. EXAM: ULTRASOUND GUIDED THERAPEUTIC RIGHT THORACENTESIS MEDICATIONS: 10 mL 1% lidocaine COMPLICATIONS: None immediate. PROCEDURE: An ultrasound guided thoracentesis was thoroughly discussed with the patient and questions answered. The benefits, risks, alternatives and complications were also discussed. The patient understands and wishes to proceed with the procedure. Written consent was obtained. Ultrasound was performed to localize and mark an adequate pocket of fluid in the right chest. The area was then prepped and draped in the normal sterile fashion. 1% Lidocaine was used for local anesthesia. Under ultrasound guidance a 6 Fr Safe-T-Centesis catheter was introduced. Thoracentesis was performed. The catheter was removed and a dressing applied. FINDINGS: A total of approximately 1.7 liters of yellow, hazy fluid was removed. Samples were sent to the laboratory as requested by the clinical team. IMPRESSION: Successful ultrasound guided therapeutic right thoracentesis yielding 1.7 liters of pleural fluid. Ready by: Brynda Greathouse PA-C Electronically Signed   By: Markus Daft M.D.   On: 10/12/2018 15:00    Scheduled Meds:  diltiazem  120 mg Oral Daily   dutasteride  0.5 mg Oral Daily   ezetimibe  10 mg Oral Daily   gabapentin  600 mg Oral QHS   lidocaine (PF)       pantoprazole  40 mg Oral BID   pravastatin  40 mg Oral q1800   ranolazine  500 mg Oral BID   senna-docusate  1 tablet Oral QHS   sucralfate  1 g Oral Q6H   vitamin E  400 Units Oral Daily   Continuous Infusions:  sodium chloride 250 mL (10/10/18 1648)   PRN Meds:.sodium chloride, acetaminophen **OR**  acetaminophen, calcium carbonate, ipratropium-albuterol, lidocaine (PF), ondansetron **OR** ondansetron (ZOFRAN) IV   ASSESMENT:   *   Blood loss anemia Dark stool, FOBT negative 8/5.  So ? Role of GI bleeding?  Hgb 6.7 >> 9.1  >> 8.7 after 2 PRBCs Presence of Ab in blood make for challenging type and cross for transfusions.    *    07/2017 EGD with esoph ulcer, esoph stenosis, pyloric stenosis. Continues BID PPI and QID carafate.    *    Chronic Eliquis, last dose 8/4 PM.    *   Right pleural effusion.  Dyspnea.  1.7 L R paracentesis 10/12/2018.  650 ml L thora 10/13/18  *    Hypokalemia.    Resolved.  *    Noncritical hyponatremia, stable, improved.  *   New right inguinal hernia, non-obstructive/asymptomatic, new anasarca without cirrhosis, stable left inguinal hernia on CT.    *   ASPVD.  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  Post procedure pseudoaneurysm and then hematoma resolved.      PLAN   *    EGD, colonoscopy.  Set for 10 PM Tmrw  Dr. Bryan Lemma will see patient and confirm, but prep, diet ordered.        Azucena Freed  10/13/2018, 10:46 AM Phone [phone]  [phone] °

## 2018-10-14 ENCOUNTER — Inpatient Hospital Stay (HOSPITAL_COMMUNITY): Payer: Medicare Other | Admitting: Anesthesiology

## 2018-10-14 ENCOUNTER — Encounter (HOSPITAL_COMMUNITY)
Admission: EM | Disposition: A | Discharge status: Home / Self Care | Payer: Self-pay | Source: Other Acute Inpatient Hospital | Attending: Internal Medicine

## 2018-10-14 ENCOUNTER — Encounter (HOSPITAL_COMMUNITY): Payer: Self-pay | Admitting: Certified Registered"

## 2018-10-14 DIAGNOSIS — K222 Esophageal obstruction: Secondary | ICD-10-CM

## 2018-10-14 DIAGNOSIS — K311 Adult hypertrophic pyloric stenosis: Secondary | ICD-10-CM

## 2018-10-14 DIAGNOSIS — K449 Diaphragmatic hernia without obstruction or gangrene: Secondary | ICD-10-CM

## 2018-10-14 DIAGNOSIS — K2970 Gastritis, unspecified, without bleeding: Secondary | ICD-10-CM

## 2018-10-14 HISTORY — PX: BALLOON DILATION: SHX5330

## 2018-10-14 HISTORY — PX: COLONOSCOPY WITH PROPOFOL: SHX5780

## 2018-10-14 HISTORY — PX: BIOPSY: SHX5522

## 2018-10-14 HISTORY — PX: ESOPHAGOGASTRODUODENOSCOPY (EGD) WITH PROPOFOL: SHX5813

## 2018-10-14 LAB — BASIC METABOLIC PANEL
Anion gap: 15 (ref 5–15)
BUN: 21 mg/dL (ref 8–23)
CO2: 26 mmol/L (ref 22–32)
Calcium: 8.5 mg/dL — ABNORMAL LOW (ref 8.9–10.3)
Chloride: 88 mmol/L — ABNORMAL LOW (ref 98–111)
Creatinine, Ser: 1.38 mg/dL — ABNORMAL HIGH (ref 0.61–1.24)
GFR calc Af Amer: 58 mL/min — ABNORMAL LOW (ref >60)
GFR calc non Af Amer: 50 mL/min — ABNORMAL LOW (ref >60)
Glucose, Bld: 109 mg/dL — ABNORMAL HIGH (ref 70–99)
Potassium: 3.4 mmol/L — ABNORMAL LOW (ref 3.5–5.1)
Sodium: 129 mmol/L — ABNORMAL LOW (ref 135–145)

## 2018-10-14 LAB — TYPE AND SCREEN
ABO/RH(D): O NEG
Antibody Screen: POSITIVE
Unit division: 0
Unit division: 0
Unit division: 0
Unit division: 0

## 2018-10-14 LAB — BPAM RBC
Blood Product Expiration Date: 202008252359
Blood Product Expiration Date: 202009042359
Blood Product Expiration Date: 202009052359
Blood Product Expiration Date: 202009102359
ISSUE DATE / TIME: 202008051646
ISSUE DATE / TIME: 202008051936
Unit Type and Rh: 9500
Unit Type and Rh: 9500
Unit Type and Rh: 9500
Unit Type and Rh: 9500

## 2018-10-14 LAB — HEMOGLOBIN AND HEMATOCRIT, BLOOD
HCT: 27.3 % — ABNORMAL LOW (ref 39.0–52.0)
Hemoglobin: 8.9 g/dL — ABNORMAL LOW (ref 13.0–17.0)

## 2018-10-14 LAB — GLUCOSE, CAPILLARY: Glucose-Capillary: 109 mg/dL — ABNORMAL HIGH (ref 70–99)

## 2018-10-14 SURGERY — COLONOSCOPY WITH PROPOFOL
Anesthesia: Monitor Anesthesia Care

## 2018-10-14 MED ORDER — PHENYLEPHRINE 40 MCG/ML (10ML) SYRINGE FOR IV PUSH (FOR BLOOD PRESSURE SUPPORT)
PREFILLED_SYRINGE | INTRAVENOUS | Status: DC | PRN
  Administered 2018-10-14: 200 ug via INTRAVENOUS
  Administered 2018-10-14: 80 ug via INTRAVENOUS
  Administered 2018-10-14: 40 ug via INTRAVENOUS
  Administered 2018-10-14: 200 ug via INTRAVENOUS
  Administered 2018-10-14: 120 ug via INTRAVENOUS
  Administered 2018-10-14 (×2): 80 ug via INTRAVENOUS

## 2018-10-14 MED ORDER — ONDANSETRON HCL 4 MG/2ML IJ SOLN
INTRAMUSCULAR | Status: DC | PRN
  Administered 2018-10-14: 4 mg via INTRAVENOUS

## 2018-10-14 MED ORDER — PROPOFOL 500 MG/50ML IV EMUL
INTRAVENOUS | Status: DC | PRN
  Administered 2018-10-14: 100 ug/kg/min via INTRAVENOUS

## 2018-10-14 MED ORDER — LIDOCAINE 2% (20 MG/ML) 5 ML SYRINGE
INTRAMUSCULAR | Status: DC | PRN
  Administered 2018-10-14: 40 mg via INTRAVENOUS

## 2018-10-14 MED ORDER — POTASSIUM CHLORIDE CRYS ER 20 MEQ PO TBCR
40.0000 meq | EXTENDED_RELEASE_TABLET | Freq: Once | ORAL | Status: AC
  Administered 2018-10-15: 40 meq via ORAL
  Filled 2018-10-14: qty 2

## 2018-10-14 MED ORDER — EPHEDRINE SULFATE 50 MG/ML IJ SOLN
INTRAMUSCULAR | Status: DC | PRN
  Administered 2018-10-14: 10 mg via INTRAVENOUS
  Administered 2018-10-14: 15 mg via INTRAVENOUS
  Administered 2018-10-14: 10 mg via INTRAVENOUS
  Administered 2018-10-14: 15 mg via INTRAVENOUS

## 2018-10-14 MED ORDER — PROPOFOL 10 MG/ML IV BOLUS
INTRAVENOUS | Status: DC | PRN
  Administered 2018-10-14 (×2): 20 mg via INTRAVENOUS

## 2018-10-14 MED ORDER — LACTATED RINGERS IV SOLN
INTRAVENOUS | Status: DC
  Administered 2018-10-14: 11:00:00 via INTRAVENOUS

## 2018-10-14 SURGICAL SUPPLY — 25 items

## 2018-10-14 NOTE — Op Note (Signed)
[facility] Patient Name: [name] Procedure Date : 10/14/2018 MRN: [id number] Attending MD: Gerrit Heck , MD Date of Birth: Mar 24, 1945 CSN: [id number] Age: 73 Admit Type: Inpatient Procedure:                Upper GI endoscopy Indications:              Anemia of undetermined etiology, Dysphagia, Reflux                            esophagitis                           73 yo male with symptomatic anemia with otherwise                            normal ferritin and FOBT- stools. He has a recent                            history of EGD in 07/2018 notable for erosive                            esophagitis, linear esophageal ulcer, and                            esophageal stricture. Previously, he has a history                            of PUD with pyloric channel ulcer and cecal AVM                            with active bleeding in 2016 requiring thermal                            therapy. Providers:                Gerrit Heck, MD, Grace Isaac, RN, Marguerita Merles, Technician Referring MD:              Medicines:                Monitored Anesthesia Care Complications:            No immediate complications. Estimated Blood Loss:     Estimated blood loss was minimal. Procedure:                Pre-Anesthesia Assessment:                           - Prior to the procedure, a History and Physical                            was performed, and patient medications and                            allergies were reviewed. The patient's tolerance of  previous anesthesia was also reviewed. The risks                            and benefits of the procedure and the sedation                            options and risks were discussed with the patient.                            All questions were answered, and informed consent                            was obtained. Prior Anticoagulants: The patient has                             taken Eliquis (apixaban), last dose was 5 days                            prior to procedure. ASA Grade Assessment: III - A                            patient with severe systemic disease. After                            reviewing the risks and benefits, the patient was                            deemed in satisfactory condition to undergo the                            procedure.                           After obtaining informed consent, the endoscope was                            passed under direct vision. Throughout the                            procedure, the patient's blood pressure, pulse, and                            oxygen saturations were monitored continuously. The                            [id number]-H190 ([id number] Olympus gastroscope was                            introduced through the mouth, and advanced to the                            second part of duodenum. The upper GI endoscopy was  accomplished without difficulty. The patient                            tolerated the procedure well. Scope In: Scope Out: Findings:      One benign-appearing, intrinsic mild stenosis was found 40 cm from the       incisors. This stenosis measured 1 cm (in length). The stenosis was       traversed. A TTS dilator was passed through the scope. Dilation with a       15-16.5-18 mm balloon dilator was performed to 18 mm. The dilation site       was examined and showed no bleeding, mucosal tear or perforation. This       was then biopsied with a cold forceps for further fracturing of the       ring. Estimated blood loss was minimal.      A 2-3 cm sliding type hiatal hernia was present.      The upper third of the esophagus and middle third of the esophagus were       normal. The previously noted linear esophegal ulcer and erosive       esophagitis have since healed.      Scattered mild inflammation characterized by congestion (edema) and       erythema was found  in the entire examined stomach. There was no       bleeding. Biopsies were taken with a cold forceps for Helicobacter       pylori testing. Estimated blood loss was minimal.      A benign-appearing, intrinsic mild, fixed stenosis was found at the       pylorus. This was easily traversed and looked unchanged from each of the       previous endoscopies. No pyloric channel ulcer noted.      The duodenal bulb, first portion of the duodenum and second portion of       the duodenum were normal. Biopsies for histology were taken with a cold       forceps for evaluation of celiac disease. Estimated blood loss was       minimal. Impression:               - Benign-appearing esophageal stenosis. Dilated                            with a 18 mm TTS balloon without mucosal rent. THis                            was then fractured using cold forceps.                           - Small hiatal hernia.                           - Normal upper third of esophagus and middle third                            of esophagus.                           - Gastritis. Biopsied.                           -  Gastric stenosis was found at the pylorus.                           - Normal duodenal bulb, first portion of the                            duodenum and second portion of the duodenum.                            Biopsied. Recommendation:           - Continue present medications.                           - Await pathology results.                           - Perform a colonoscopy today.                           - Additional recommendations pending colonsocopy                            findings.                           - Repeat EGD with dilation as needed. Procedure Code(s):        --- Professional ---                           [phone], Esophagogastroduodenoscopy, flexible,                            transoral; with transendoscopic balloon dilation of                            esophagus (less than 30 mm  diameter) Diagnosis Code(s):        --- Professional ---                           K22.2, Esophageal obstruction                           K44.9, Diaphragmatic hernia without obstruction or                            gangrene                           K29.70, Gastritis, unspecified, without bleeding                           K31.1, Adult hypertrophic pyloric stenosis                           D50.0, Iron deficiency anemia secondary to blood  loss (chronic)                           R13.10, Dysphagia, unspecified                           K21.0, Gastro-esophageal reflux disease with                            esophagitis CPT copyright 2019 [employer]. All rights reserved. The codes documented in this report are preliminary and upon coder review may  be revised to meet current compliance requirements. Gerrit Heck, MD 10/14/2018 12:26:33 PM Number of Addenda: 0

## 2018-10-14 NOTE — Transfer of Care (Signed)
Immediate Anesthesia Transfer of Care Note  Patient: [name]  Procedure(s) Performed: COLONOSCOPY WITH PROPOFOL (N/A ) ESOPHAGOGASTRODUODENOSCOPY (EGD) WITH PROPOFOL (N/A ) BIOPSY BALLOON DILATION (N/A )  Patient Location: Endoscopy Unit  Anesthesia Type:MAC  Level of Consciousness: drowsy and responds to stimulation  Airway & Oxygen Therapy: Patient Spontanous Breathing  Post-op Assessment: Report given to RN  Post vital signs: Reviewed and stable  Last Vitals:  Vitals Value Taken Time  BP    Temp    Pulse 84 10/14/18 1224  Resp 26 10/14/18 1224  SpO2 93 % 10/14/18 1224  Vitals shown include unvalidated device data.  Last Pain:  Vitals:   10/14/18 1054  TempSrc: Oral  PainSc: 0-No pain         Complications: No apparent anesthesia complications

## 2018-10-14 NOTE — Progress Notes (Signed)
PROGRESS NOTE    [name]  WCB:[id number] DOB: 16-Mar-1945 DOA: 10/10/2018 PCP: Garwin Brothers, MD   Brief Narrative:  HPI On 10/10/2018 by Dr. Jennette Kettle [name] is a 73 y.o. male with medical history significant of CAD s/p CABG and stents, A.Fib on Eliquis.  Esophageal strictures requiring dilation multiple times.  Chronic mesenteric ischemic s/p stenting back in May.  Also Anemia in May with finding of erosive esophagitis and esophageal ulcer that wasn't bleeding at time of endoscopy.  Patient was discharged on PPIs as well as Carafate through July.  Patient has had 2-3 days of generalized weakness, exercise intolerance.  Patient saw his PCP yesterday and had labs drawn.  His PCP called him up today and told him to "get in to the ED for 2 units of blood" he reports.  No CP, fever, sick contacts.  Denies any melena, hematochezia, emesis.  NO abdominal pain.  Interim history Admitted with symptomatic anemia and GI bleed. GI consulted. Course complicated by shortness of breath, s/p right thoracentesis. Breathing improved.  Assessment & Plan   Symptomatic anemia/GI bleed -presented with generalized weakness, exercise intolerance -Hemoglobin down to 6.7, was transfused 2 units PRBC -Hemoglobin today 8.9 -Continue to complain of shortness of breath -Patient recently had EGD on 07/26/2018: Nonbleeding esophageal ulcer, biopsied.  Benign-appearing esophageal stenosis.  Small hiatal hernia.  Gastric stenosis was found at the pylorus. Biopsy negative for malignancy and Candida. -CT A/P: No inguinal hernia, no evidence of retroperitoneal bleed.  Volume overload with bilateral pleural effusions and small volume ascites. -Gastroenterology consulted and appreciated, pending EGD and colonoscopy  -Continue protonix 40mg  PO BID, carafate   Acute dsypnea/ Acute on chronic possibly CHF exacerbation/ Right pleural effusion -Possibly secondary to fluid overload given that patient received 2  units PRBC as well as IV fluids and IV medications -Chest x-ray yesterday evening reviewed and shows right-sided pleural effusion, no infection -has been given several doses of lasix- however given AKI, will hold lasix today -last Echocardiogram June 2020- showed EF 61-60%, diastolic parameters were indeterminate -Upon review of chart, patient did have EF 45% on catheretization   -monitor intake/output, daily weights -Continue supplemental oxygen -Obtained repeat CXR today, appears mildly improved when compared to 8/5 CXR. R pleural effusion -s/p R US guided thoracentesis, yielding 1.7L  -S/p L US guided thoracentesis, yielding 650cc  -breathing has improved today, no longer on supplemental oxygen  Acute kidney injury -Creatinine on admission 1.93.  Baseline approximately 0.9 -Creatinine down to 1.38 -Likely secondary to the above -Continue to monitor BMP  Paroxysmal atrial fibrillation -Eliquis held -Continue Cardizem  Essential hypertension -Continue Cardizem  Hypokalemia -Potassium  3.4 -will replace and monitor  -Magnesium 2.1   Hyponatremia/Hypochloremia -possibly due to diuresis, improving slowly -Continue to monitor BMP  DVT Prophylaxis  SCDs  Code Status: Full  Family Communication: None at bedside  Disposition Plan: Admitted. Pending  GI workup. Home when stable  Consultants Gastroenterology   Procedures  R US guided thoracentesis   Antibiotics   Anti-infectives (From admission, onward)   None      Subjective:   [name] seen and examined today.  Feels breathing has improved. Feeling very tired today and wants to sleep. Denies chest pain, shortness of breath, abdominal pain, N/V, dizziness, headache.   Objective:   Vitals:   10/13/18 1948 10/14/18 0037 10/14/18 0141 10/14/18 0537  BP: 119/67   123/78  Pulse: 88  90 74  Resp: 18   18  Temp: 97.8 F (36.6 C)   97.7 F (36.5 C)  TempSrc: Oral   Oral  SpO2: 99%  95% 95%  Weight:  65.7 kg      Height:        Intake/Output Summary (Last 24 hours) at 10/14/2018 0920 Last data filed at 10/14/2018 0700 Gross per 24 hour  Intake 1000 ml  Output 800 ml  Net 200 ml   Filed Weights   10/12/18 0604 10/13/18 0403 10/14/18 0037  Weight: 64.3 kg 63.2 kg 65.7 kg   Exam  General: Well developed, well nourished, NAD, appears stated age  73: NCAT, mucous membranes moist.   Cardiovascular: S1 S2 auscultated, irregular, SEM  Respiratory: Diminished breath sounds, but clear  Abdomen: Soft, nontender, nondistended, + bowel sounds  Extremities: warm dry without cyanosis clubbing. LLE edema > RLE.   Neuro: AAOx3, nonfocal  Psych: Appropriate mood and affect, pleasant  Data Reviewed: I have personally reviewed following labs and imaging studies  CBC: Recent Labs  Lab 10/10/18 0528 10/11/18 0154 10/12/18 0520 10/13/18 0244 10/14/18 0521  WBC 6.5 6.0 6.7 6.8  --   HGB 6.7* 9.1* 9.0* 8.7* 8.9*  HCT 20.7* 27.0* 25.8* 25.4* 27.3*  MCV 85.9 83.6 82.4 83.3  --   PLT 168 160 139* 130*  --    Basic Metabolic Panel: Recent Labs  Lab 10/10/18 0528 10/11/18 0154 10/11/18 1505 10/12/18 0520 10/13/18 0244 10/14/18 0521  NA 128* 123* 125* 126* 127* 129*  K 2.4* 2.9* 3.1* 3.4* 3.8 3.4*  CL 87* 81* 85* 87* 89* 88*  CO2 26 29 27 29 28 26   GLUCOSE 145* 112* 142* 114* 104* 109*  BUN 44* 37* 32* 29* 23 21  CREATININE 1.93* 1.53* 1.53* 1.64* 1.30* 1.38*  CALCIUM 8.2* 8.1* 8.2* 8.4* 8.4* 8.5*  MG 2.1  --   --   --   --   --    GFR: Estimated Creatinine Clearance: 40.9 mL/min (A) (by C-G formula based on SCr of 1.38 mg/dL (H)). Liver Function Tests: Recent Labs  Lab 10/10/18 0528 10/12/18 0520  AST 50*  --   ALT 31  --   ALKPHOS 112  --   BILITOT 0.8  --   PROT 5.7* 6.0*  ALBUMIN 2.9*  --    No results for input(s): LIPASE, AMYLASE in the last 168 hours. No results for input(s): AMMONIA in the last 168 hours. Coagulation Profile: No results for input(s): INR, PROTIME  in the last 168 hours. Cardiac Enzymes: No results for input(s): CKTOTAL, CKMB, CKMBINDEX, TROPONINI in the last 168 hours. BNP (last 3 results) No results for input(s): PROBNP in the last 8760 hours. HbA1C: No results for input(s): HGBA1C in the last 72 hours. CBG: Recent Labs  Lab 10/14/18 0850  GLUCAP 109*   Lipid Profile: No results for input(s): CHOL, HDL, LDLCALC, TRIG, CHOLHDL, LDLDIRECT in the last 72 hours. Thyroid Function Tests: No results for input(s): TSH, T4TOTAL, FREET4, T3FREE, THYROIDAB in the last 72 hours. Anemia Panel: No results for input(s): VITAMINB12, FOLATE, FERRITIN, TIBC, IRON, RETICCTPCT in the last 72 hours. Urine analysis:    Component Value Date/Time   COLORURINE YELLOW 07/24/2018 0108   APPEARANCEUR CLEAR 07/24/2018 0108   LABSPEC 1.017 07/24/2018 0108   PHURINE 6.0 07/24/2018 0108   GLUCOSEU NEGATIVE 07/24/2018 0108   HGBUR NEGATIVE 07/24/2018 0108   BILIRUBINUR NEGATIVE 07/24/2018 0108   KETONESUR NEGATIVE 07/24/2018 0108   PROTEINUR 30 (A) 07/24/2018 0108   NITRITE NEGATIVE 07/24/2018  [place] 07/24/2018 0108   Sepsis Labs: @LABRCNTIP (procalcitonin:4,lacticidven:4)  ) Recent Results (from the past 240 hour(s))  SARS Coronavirus 2 [facility] order, Performed in [place] hospital lab)     Status: None   Collection Time: 10/10/18  4:09 AM  Result Value Ref Range Status   SARS Coronavirus 2 NEGATIVE NEGATIVE Final    Comment: (NOTE) If result is NEGATIVE SARS-CoV-2 target nucleic acids are NOT DETECTED. The SARS-CoV-2 RNA is generally detectable in upper and lower  respiratory specimens during the acute phase of infection. The lowest  concentration of SARS-CoV-2 viral copies this assay can detect is 250  copies / mL. A negative result does not preclude SARS-CoV-2 infection  and should not be used as the sole basis for treatment or other  patient management decisions.  A negative result may occur with  improper  specimen collection / handling, submission of specimen other  than nasopharyngeal swab, presence of viral mutation(s) within the  areas targeted by this assay, and inadequate number of viral copies  (<250 copies / mL). A negative result must be combined with clinical  observations, patient history, and epidemiological information. If result is POSITIVE SARS-CoV-2 target nucleic acids are DETECTED. The SARS-CoV-2 RNA is generally detectable in upper and lower  respiratory specimens dur ing the acute phase of infection.  Positive  results are indicative of active infection with SARS-CoV-2.  Clinical  correlation with patient history and other diagnostic information is  necessary to determine patient infection status.  Positive results do  not rule out bacterial infection or co-infection with other viruses. If result is PRESUMPTIVE POSTIVE SARS-CoV-2 nucleic acids MAY BE PRESENT.   A presumptive positive result was obtained on the submitted specimen  and confirmed on repeat testing.  While 2019 novel coronavirus  (SARS-CoV-2) nucleic acids may be present in the submitted sample  additional confirmatory testing may be necessary for epidemiological  and / or clinical management purposes  to differentiate between  SARS-CoV-2 and other Sarbecovirus currently known to infect humans.  If clinically indicated additional testing with an alternate test  methodology [id number]) is advised. The SARS-CoV-2 RNA is generally  detectable in upper and lower respiratory sp ecimens during the acute  phase of infection. The expected result is Negative. Fact Sheet for Patients:  StrictlyIdeas.no Fact Sheet for Healthcare Providers: BankingDealers.co.za This test is not yet approved or cleared by the [place] FDA and has been authorized for detection and/or diagnosis of SARS-CoV-2 by FDA under an Emergency Use Authorization (EUA).  This EUA will remain in  effect (meaning this test can be used) for the duration of the COVID-19 declaration under Section 564(b)(1) of the Act, 21 U.S.C. section 360bbb-3(b)(1), unless the authorization is terminated or revoked sooner. Performed at [facility], [place] [address], [place], [place] [address]   Gram stain     Status: None   Collection Time: 10/13/18  9:57 AM   Specimen: Pleural, Left; Pleural Fluid  Result Value Ref Range Status   Specimen Description PLEURAL LEFT  Final   Special Requests NONE  Final   Gram Stain   Final    MODERATE WBC PRESENT,BOTH PMN AND MONONUCLEAR NO ORGANISMS SEEN Performed at [facility], [place] [address] [place], [place] [address]    Report Status 10/13/2018 FINAL  Final      Radiology Studies: Dg Chest 1 View  Result Date: 10/13/2018 CLINICAL DATA:  Status post left thoracentesis EXAM: CHEST  1 VIEW  COMPARISON:  Chest radiograph from one day prior. FINDINGS: Intact sternotomy wires. CABG clips overlie the mediastinum. Stable cardiomediastinal silhouette with mild cardiomegaly. No pneumothorax. No residual left pleural effusion. Stable small right pleural effusion. Emphysema. No overt pulmonary edema. Mild right basilar scarring versus atelectasis. IMPRESSION: 1. No pneumothorax.  No residual left pleural effusion. 2. Stable small right pleural effusion. 3. Emphysema. 4. Mild right basilar scarring versus atelectasis. 5. Cardiomegaly. Electronically Signed   By: Ilona Sorrel M.D.   On: 10/13/2018 10:30   Dg Chest 1 View  Result Date: 10/12/2018 CLINICAL DATA:  Thoracentesis. EXAM: CHEST  1 VIEW COMPARISON:  Earlier the same day FINDINGS: Right thoracentesis. There are now trace bilateral pleural effusions. No visible pneumothorax or re-expansion edema. Cardiomegaly. CABG. IMPRESSION: No complicating feature after thoracentesis. Electronically Signed   By: Monte Fantasia M.D.   On: 10/12/2018 10:23   Ir Thoracentesis Asp Pleural Space W/img Guide  Result Date:  10/12/2018 INDICATION: Patient presents with volume overload, bilateral pleural effusions. Request is made for therapeutic thoracentesis. EXAM: ULTRASOUND GUIDED THERAPEUTIC RIGHT THORACENTESIS MEDICATIONS: 10 mL 1% lidocaine COMPLICATIONS: None immediate. PROCEDURE: An ultrasound guided thoracentesis was thoroughly discussed with the patient and questions answered. The benefits, risks, alternatives and complications were also discussed. The patient understands and wishes to proceed with the procedure. Written consent was obtained. Ultrasound was performed to localize and mark an adequate pocket of fluid in the right chest. The area was then prepped and draped in the normal sterile fashion. 1% Lidocaine was used for local anesthesia. Under ultrasound guidance a 6 Fr Safe-T-Centesis catheter was introduced. Thoracentesis was performed. The catheter was removed and a dressing applied. FINDINGS: A total of approximately 1.7 liters of yellow, hazy fluid was removed. Samples were sent to the laboratory as requested by the clinical team. IMPRESSION: Successful ultrasound guided therapeutic right thoracentesis yielding 1.7 liters of pleural fluid. Ready by: Brynda Greathouse PA-C Electronically Signed   By: Markus Daft M.D.   On: 10/12/2018 15:00   US Thoracentesis Asp Pleural Space W/img Guide  Result Date: 10/13/2018 INDICATION: Patient with history of AKI, acute on chronic HF/possible HF exacerbation, dyspnea, and bilateral pleural effusions s/p right thoracentesis 10/12/2018 yielding 1.7 L. Request is made for diagnostic and therapeutic left thoracentesis. EXAM: ULTRASOUND GUIDED DIAGNOSTIC AND THERAPEUTIC LEFT THORACENTESIS MEDICATIONS: 10 mL 1% lidocaine COMPLICATIONS: None immediate. PROCEDURE: An ultrasound guided thoracentesis was thoroughly discussed with the patient and questions answered. The benefits, risks, alternatives and complications were also discussed. The patient understands and wishes to proceed with  the procedure. Written consent was obtained. Ultrasound was performed to localize and mark an adequate pocket of fluid in the left chest. The area was then prepped and draped in the normal sterile fashion. 1% Lidocaine was used for local anesthesia. Under ultrasound guidance a 6 Fr Safe-T-Centesis catheter was introduced. Thoracentesis was performed. The catheter was removed and a dressing applied. FINDINGS: A total of approximately 650 mL of clear gold fluid was removed. Samples were sent to the laboratory as requested by the clinical team. IMPRESSION: Successful ultrasound guided left thoracentesis yielding 650 mL of pleural fluid. Read by: Earley Abide, PA-C Electronically Signed   By: Corrie Mckusick D.O.   On: 10/13/2018 10:35     Scheduled Meds:  diltiazem  120 mg Oral Daily   dutasteride  0.5 mg Oral Daily   ezetimibe  10 mg Oral Daily   gabapentin  600 mg Oral QHS   pantoprazole  40 mg Oral BID  pravastatin  40 mg Oral q1800   ranolazine  500 mg Oral BID   senna-docusate  1 tablet Oral QHS   sucralfate  1 g Oral Q6H   vitamin E  400 Units Oral Daily   Continuous Infusions:  sodium chloride 250 mL (10/10/18 1648)     LOS: 3 days   Time Spent in minutes   30 minutes   Quentin Strebel D.O. on 10/14/2018 at 9:20 AM  Between 7am to 7pm - Please see pager noted on [email or web address]  After 7pm go to [email or web address]  And look for the night coverage person covering for me after hours  [facility] Office  [phone]

## 2018-10-14 NOTE — Anesthesia Postprocedure Evaluation (Signed)
Anesthesia Post Note  Patient: [name]  Procedure(s) Performed: COLONOSCOPY WITH PROPOFOL (N/A ) ESOPHAGOGASTRODUODENOSCOPY (EGD) WITH PROPOFOL (N/A ) BIOPSY BALLOON DILATION (N/A )     Patient location during evaluation: PACU Anesthesia Type: MAC Level of consciousness: awake and alert Pain management: pain level controlled Vital Signs Assessment: post-procedure vital signs reviewed and stable Respiratory status: spontaneous breathing, nonlabored ventilation, respiratory function stable and patient connected to nasal cannula oxygen Cardiovascular status: stable and blood pressure returned to baseline Postop Assessment: no apparent nausea or vomiting Anesthetic complications: no    Last Vitals:  Vitals:   10/14/18 1230 10/14/18 1240  BP: (!) 98/48 (!) 108/47  Pulse: 82 77  Resp: (!) 22 (!) 21  Temp:    SpO2: 95% 96%    Last Pain:  Vitals:   10/14/18 1220  TempSrc: Axillary  PainSc:                  Tiajuana Amass

## 2018-10-14 NOTE — Anesthesia Procedure Notes (Signed)
Procedure Name: MAC Date/Time: 10/14/2018 11:22 AM Performed by: Barrington Ellison, CRNA Pre-anesthesia Checklist: Patient identified, Emergency Drugs available, Suction available, Patient being monitored and Timeout performed Patient Re-evaluated:Patient Re-evaluated prior to induction Oxygen Delivery Method: Simple face mask

## 2018-10-14 NOTE — Progress Notes (Signed)
Pt would like for Dr. [name] Rumpf in  [place] to see him if possible for blood work.

## 2018-10-14 NOTE — Interval H&P Note (Signed)
History and Physical Interval Note:  10/14/2018 11:15 AM  [name]  has presented today for surgery, with the diagnosis of anemia.  dark, stool.  The various methods of treatment have been discussed with the patient and family. After consideration of risks, benefits and other options for treatment, the patient has consented to  Procedure(s): COLONOSCOPY WITH PROPOFOL (N/A) ESOPHAGOGASTRODUODENOSCOPY (EGD) WITH PROPOFOL (N/A) and possible esophageal dilation as a surgical intervention.  The patient's history has been reviewed, patient examined, no change in status, stable for surgery.  I have reviewed the patient's chart and labs.  Questions were answered to the patient's satisfaction.     [name]

## 2018-10-14 NOTE — Anesthesia Preprocedure Evaluation (Signed)
Anesthesia Evaluation  Patient identified by MRN, date of birth, ID band Patient awake    Reviewed: Allergy & Precautions, NPO status , Patient's Chart, lab work & pertinent test results  Airway Mallampati: II  TM Distance: >3 FB     Dental   Pulmonary shortness of breath, former smoker,    breath sounds clear to auscultation       Cardiovascular hypertension, Pt. on medications + CAD, + Past MI, + Peripheral Vascular Disease and +CHF  + dysrhythmias  Rhythm:Regular Rate:Normal     Neuro/Psych negative neurological ROS     GI/Hepatic Neg liver ROS, hiatal hernia, PUD, GERD  ,  Endo/Other  negative endocrine ROS  Renal/GU ARFRenal disease     Musculoskeletal  (+) Arthritis ,   Abdominal   Peds  Hematology  (+) anemia ,   Anesthesia Other Findings   Reproductive/Obstetrics                             Lab Results  Component Value Date   WBC 6.8 10/13/2018   HGB 8.9 (L) 10/14/2018   HCT 27.3 (L) 10/14/2018   MCV 83.3 10/13/2018   PLT 130 (L) 10/13/2018   Lab Results  Component Value Date   CREATININE 1.38 (H) 10/14/2018   BUN 21 10/14/2018   NA 129 (L) 10/14/2018   K 3.4 (L) 10/14/2018   CL 88 (L) 10/14/2018   CO2 26 10/14/2018    Anesthesia Physical Anesthesia Plan  ASA: III  Anesthesia Plan: MAC   Post-op Pain Management:    Induction: Intravenous  PONV Risk Score and Plan: 1 and Propofol infusion, Ondansetron and Treatment may vary due to age or medical condition  Airway Management Planned: Simple Face Mask, Natural Airway and Nasal Cannula  Additional Equipment:   Intra-op Plan:   Post-operative Plan:   Informed Consent: I have reviewed the patients History and Physical, chart, labs and discussed the procedure including the risks, benefits and alternatives for the proposed anesthesia with the patient or authorized representative who has indicated his/her  understanding and acceptance.       Plan Discussed with: CRNA  Anesthesia Plan Comments:         Anesthesia Quick Evaluation

## 2018-10-14 NOTE — Op Note (Addendum)
[facility] Patient Name: [name] Procedure Date : 10/14/2018 MRN: [id number] Attending MD: Gerrit Heck , MD Date of Birth: [birth year] CSN: [id number] Age: 73 Admit Type: Inpatient Procedure:                Colonoscopy Indications:              Symptomatic anemia of undetermined etiology,                            History of cecal angiodysplasia Providers:                Gerrit Heck, MD, Grace Isaac, RN, Marguerita Merles, Technician Referring MD:              Medicines:                Monitored Anesthesia Care Complications:            No immediate complications. Estimated Blood Loss:     Estimated blood loss was minimal. Procedure:                Pre-Anesthesia Assessment:                           - Prior to the procedure, a History and Physical                            was performed, and patient medications and                            allergies were reviewed. The patient's tolerance of                            previous anesthesia was also reviewed. The risks                            and benefits of the procedure and the sedation                            options and risks were discussed with the patient.                            All questions were answered, and informed consent                            was obtained. Prior Anticoagulants: The patient has                            taken Eliquis (apixaban), last dose was 5 days                            prior to procedure. ASA Grade Assessment: III - A  patient with severe systemic disease. After                            reviewing the risks and benefits, the patient was                            deemed in satisfactory condition to undergo the                            procedure.                           After obtaining informed consent, the colonoscope                            was passed under direct vision. Throughout the                             procedure, the patient's blood pressure, pulse, and                            oxygen saturations were monitored continuously. The                            CF-HQ190L ([id number]) Olympus colonoscope was                            introduced through the anus and advanced to the the                            cecum, identified by appendiceal orifice and                            ileocecal valve. The colonoscopy was performed                            without difficulty. The patient tolerated the                            procedure well. The quality of the bowel                            preparation was poor. The ileocecal valve,                            appendiceal orifice, and rectum were photographed. Scope In: 11:58:34 AM Scope Out: 12:15:13 PM Scope Withdrawal Time: 0 hours 12 minutes 30 seconds  Total Procedure Duration: 0 hours 16 minutes 39 seconds  Findings:      The perianal and digital rectal examinations were normal.      Localized mild inflammation characterized by congestion (edema) and       erythema in a linear distribution was found in the distal sigmoid colon,       located 30 cm from the anal verge. Biopsies were taken with a cold  forceps for histology. Estimated blood loss was minimal.      A large amount of semi-solid stool was found in the entire colon,       precluding visualization. Lavage of the area was performed using copious       amounts of sterile water, resulting in incomplete clearance with fair       visualization. There was no active bleeding noted on this study.      Retroflexion in the rectum was not performed due to anatomy and rectum       full of stool which precluded visualization. Impression:               - Preparation of the colon was poor.                           - Localized mild inflammation was found in the                            distal sigmoid colon. The location and linear                             distribution are suspicious for ischemic colitis.                            This was biopsied.                           - Stool in the entire examined colon. Recommendation:           - Return patient to hospital ward for ongoing care.                           - Soft diet today.                           - Continue present medications.                           - Await pathology results. Procedure Code(s):        --- Professional ---                           [phone], Colonoscopy, flexible; with biopsy, single                            or multiple Diagnosis Code(s):        --- Professional ---                           K52.9, Noninfective gastroenteritis and colitis,                            unspecified                           D50.0, Iron deficiency anemia secondary to blood  loss (chronic) CPT copyright 2019 American [employer] Association. All rights reserved. The codes documented in this report are preliminary and upon coder review may  be revised to meet current compliance requirements. Gerrit Heck, MD 10/14/2018 12:38:25 PM Number of Addenda: 0

## 2018-10-15 ENCOUNTER — Encounter (HOSPITAL_COMMUNITY): Payer: Self-pay | Admitting: Gastroenterology

## 2018-10-15 DIAGNOSIS — K2990 Gastroduodenitis, unspecified, without bleeding: Secondary | ICD-10-CM

## 2018-10-15 LAB — BASIC METABOLIC PANEL
Anion gap: 10 (ref 5–15)
BUN: 18 mg/dL (ref 8–23)
CO2: 29 mmol/L (ref 22–32)
Calcium: 8.4 mg/dL — ABNORMAL LOW (ref 8.9–10.3)
Chloride: 90 mmol/L — ABNORMAL LOW (ref 98–111)
Creatinine, Ser: 1.33 mg/dL — ABNORMAL HIGH (ref 0.61–1.24)
GFR calc Af Amer: >60 mL/min (ref >60)
GFR calc non Af Amer: 52 mL/min — ABNORMAL LOW (ref >60)
Glucose, Bld: 103 mg/dL — ABNORMAL HIGH (ref 70–99)
Potassium: 3.1 mmol/L — ABNORMAL LOW (ref 3.5–5.1)
Sodium: 129 mmol/L — ABNORMAL LOW (ref 135–145)

## 2018-10-15 LAB — RETICULOCYTES
Immature Retic Fract: 16.9 % — ABNORMAL HIGH (ref 2.3–15.9)
RBC.: 2.96 MIL/uL — ABNORMAL LOW (ref 4.22–5.81)
Retic Count, Absolute: 75.8 10*3/uL (ref 19.0–186.0)
Retic Ct Pct: 2.6 % (ref 0.4–3.1)

## 2018-10-15 LAB — IRON AND TIBC
Iron: 28 ug/dL — ABNORMAL LOW (ref 45–182)
Saturation Ratios: 8 % — ABNORMAL LOW (ref 17.9–39.5)
TIBC: 333 ug/dL (ref 250–450)
UIBC: 305 ug/dL

## 2018-10-15 LAB — VITAMIN B12: Vitamin B-12: 1519 pg/mL — ABNORMAL HIGH (ref 180–914)

## 2018-10-15 LAB — HEMOGLOBIN AND HEMATOCRIT, BLOOD
HCT: 24.9 % — ABNORMAL LOW (ref 39.0–52.0)
Hemoglobin: 8.2 g/dL — ABNORMAL LOW (ref 13.0–17.0)

## 2018-10-15 LAB — FOLATE: Folate: 23.7 ng/mL (ref >5.9)

## 2018-10-15 LAB — FERRITIN: Ferritin: 51 ng/mL (ref 24–336)

## 2018-10-15 MED ORDER — POTASSIUM CHLORIDE CRYS ER 20 MEQ PO TBCR
40.0000 meq | EXTENDED_RELEASE_TABLET | ORAL | Status: DC
  Administered 2018-10-15: 40 meq via ORAL
  Filled 2018-10-15: qty 2

## 2018-10-15 NOTE — Care Management Important Message (Signed)
Important Message  Patient Details  Name: [name] MRN: [id number] Date of Birth: 01/19/1946   Medicare Important Message Given:  Yes     Sila Sarsfield Montine Circle 10/15/2018, 3:11 PM

## 2018-10-15 NOTE — Evaluation (Signed)
Physical Therapy Evaluation Patient Details Name: [name] MRN: [id number] DOB: 10/02/1945 Today's Date: 10/15/2018   History of Present Illness  [name] is a 73 y.o. male with medical history significant of CAD s/p CABG and stents, A.Fib on Eliquis.  Esophageal strictures requiring dilation multiple times.  Chronic mesenteric ischemic s/p stenting back in May.  Also Anemia in May with finding of erosive esophagitis and esophageal ulcer that wasn't bleeding at time of endoscopy; comes back to hospital 8/5 with generalized weakness, exercise intolerance; Hemoglobin 6.7, transfused 2 units  Clinical Impression   Pt admitted with above diagnosis. Pt currently with functional limitations due to the deficits listed below (see PT Problem List). Walking with a RW for steadiness prior to admission; Present with decr activity tolerance, supoptimal posture;  Pt will benefit from skilled PT to increase their independence and safety with mobility to allow discharge to the venue listed below.    Overall moving slowly , but safely, and vitals standing post amb on Room Air are checking out fine; OK for dc home from PT standpoint     Follow Up Recommendations Supervision/Assistance - 24 hour;Other (comment)(Son is a PTA, and pt reports he is very helpful; We can consider HHPT follow up, but if pt wishes, no pressing need for HHPT)    Equipment Recommendations  None recommended by PT    Recommendations for Other Services       Precautions / Restrictions Precautions Precautions: Fall Precaution Comments: Fall risk greatly reduced with use of RW      Mobility  Bed Mobility                  Transfers Overall transfer level: Needs assistance Equipment used: Rolling walker (2 wheeled) Transfers: Sit to/from Stand Sit to Stand: Supervision         General transfer comment: Cues to self-monitor for activity tolerance  Ambulation/Gait Ambulation/Gait assistance: Min guard Gait  Distance (Feet): 100 Feet Assistive device: Rolling walker (2 wheeled) Gait Pattern/deviations: Step-through pattern;Decreased step length - left;Decreased step length - right;Trunk flexed     General Gait Details: Slow gait, and RW useful for steadiness  Stairs            Wheelchair Mobility    Modified Rankin (Stroke Patients Only)       Balance                                             Pertinent Vitals/Pain Pain Assessment: No/denies pain    Home Living Family/patient expects to be discharged to:: Private residence Living Arrangements: Spouse/significant other Available Help at Discharge: Family;Available 24 hours/day Type of Home: House Home Access: Stairs to enter Entrance Stairs-Rails: (Storm door) Entrance Stairs-Number of Steps: 2 Home Layout: One level Home Equipment: Walker - 2 wheels;Cane - quad;Bedside commode;Toilet riser;Tub bench      Prior Function Level of Independence: Needs assistance   Gait / Transfers Assistance Needed: pt walking with RW since hip ORIF  ADL's / Homemaking Assistance Needed: assist for lower body bathing and dressing since hip ORIF  Comments: son is a PTA and his girlfriend is a COTA who have been giving pt education and pointers     Hand Dominance        Extremity/Trunk Assessment   Upper Extremity Assessment Upper Extremity Assessment: Overall WFL for tasks assessed      Lower Extremity Assessment Lower Extremity Assessment: Generalized weakness    Cervical / Trunk Assessment Cervical / Trunk Assessment: Kyphotic  Communication   Communication: No difficulties  Cognition Arousal/Alertness: Awake/alert Behavior During Therapy: WFL for tasks assessed/performed Overall Cognitive Status: Within Functional Limits for tasks assessed                                        General Comments General comments (skin integrity, edema, etc.): Took BP standing after ambulation:  120/63; HR 93, O2 sats on Room Air 99%    Exercises     Assessment/Plan    PT Assessment All further PT needs can be met in the next venue of care  PT Problem List Decreased activity tolerance;Decreased balance       PT Treatment Interventions      PT Goals (Current goals can be found in the Care Plan section)  Acute Rehab PT Goals Patient Stated Goal: hopes to go home today PT Goal Formulation: With patient Time For Goal Achievement: 10/29/18    Frequency     Barriers to discharge        Co-evaluation               AM-PAC PT "6 Clicks" Mobility  Outcome Measure Help needed turning from your back to your side while in a flat bed without using bedrails?: None Help needed moving from lying on your back to sitting on the side of a flat bed without using bedrails?: None Help needed moving to and from a bed to a chair (including a wheelchair)?: None Help needed standing up from a chair using your arms (e.g., wheelchair or bedside chair)?: None Help needed to walk in hospital room?: A Little Help needed climbing 3-5 steps with a railing? : A Little 6 Click Score: 22    End of Session Equipment Utilized During Treatment: Gait belt Activity Tolerance: Patient tolerated treatment well Patient left: in chair;with call bell/phone within reach Nurse Communication: Mobility status PT Visit Diagnosis: Other abnormalities of gait and mobility (R26.89);Other (comment)(suboptimal posture)    Time: 8469-6295 PT Time Calculation (min) (ACUTE ONLY): 16 min   Charges:   PT Evaluation $PT Eval Moderate Complexity: 1 Mod          Roney [place], [place]  Acute Rehabilitation Services Pager [phone] Office [phone]   Colletta [place] 10/15/2018, 10:20 AM

## 2018-10-15 NOTE — TOC Transition Note (Signed)
Transition of Care [facility]) - CM/SW Discharge Note   Patient Details  Name: [name] MRN: [id number] Date of Birth: 1945/10/19  [facility] CM/SW Contact:  Zenon Mayo, RN Phone Number: 10/15/2018, 12:44 PM   Clinical Narrative:    Patient for discharge today, he states his son is a PTA and helps him with  A lot, he does not want [place] services.  He has a walker , cane and a w/chair at home.  Final next level of care: Home/Self Care Barriers to Discharge: No Barriers Identified   Patient Goals and CMS Choice Patient states their goals for this hospitalization and ongoing recovery are:: to feel well   Choice offered to / list presented to : NA  Discharge Placement                       Discharge Plan and Services                DME Arranged: (NA)         HH Arranged: NA          Social Determinants of Health (SDOH) Interventions     Readmission Risk Interventions Readmission Risk Prevention Plan 10/15/2018  Transportation Screening Complete  PCP or Specialist Appt within 3-5 Days Complete  HRI or [place] Complete  Social Work Consult for [place] Planning/Counseling Complete  Palliative Care Screening Not Applicable  Medication Review Press photographer) Complete  Some recent data might be hidden

## 2018-10-15 NOTE — Discharge Summary (Signed)
Physician Discharge Summary  [name] IOX:[id number] DOB: 12-23-1945 DOA: 10/10/2018  PCP: Garwin Brothers, MD  Admit date: 10/10/2018 Discharge date: 10/15/2018  Time spent: 45 minutes  Recommendations for Outpatient Follow-up:  Patient will be discharged to home.  Patient will need to follow up with primary care provider within one week of discharge, repeat CBC and BMP.  Follow up with gastroenterology. Follow up with Dr. [name] Rumpf, hematology.  Patient should continue medications as prescribed.  Patient should follow a heart healthy diet.   Discharge Diagnoses:  Symptomatic anemia/GI bleed Acute dsypnea/ Acute on chronic possibly CHF exacerbation/ Right pleural effusion Acute kidney injury Paroxysmal atrial fibrillation Essential hypertension Hypokalemia Hyponatremia/Hypochloremia  Discharge Condition: Stable  Diet recommendation: heart healthy  Filed Weights   10/13/18 0403 10/14/18 0037 10/15/18 0516  Weight: 63.2 kg 65.7 kg 63.9 kg    History of present illness:  On 10/10/2018 by Dr. Suan Halter Westis a 72 y.o.malewith medical history significant ofCAD s/p CABG and stents, A.Fib on Eliquis. Esophageal strictures requiring dilation multiple times. Chronic mesenteric ischemic s/p stenting back in May. Also Anemia in May with finding of erosive esophagitis and esophageal ulcer that wasn't bleeding at time of endoscopy. Patient was discharged on PPIs as well as Carafate through July.  Patient has had 2-3 days of generalized weakness, exercise intolerance. Patient saw his PCP yesterday and had labs drawn. His PCP called him up today and told him to "get in to the ED for 2 units of blood" he reports.  No CP, fever, sick contacts.  Denies any melena, hematochezia, emesis.  NO abdominal pain.  Hospital Course:  Symptomatic anemia/GI bleed -presented with generalized weakness, exercise intolerance -Hemoglobin down to 6.7, was transfused 2 units  PRBC -Hemoglobin today 8.2 -Continue to complain of shortness of breath -Patient recently had EGD on 07/26/2018: Nonbleeding esophageal ulcer, biopsied.  Benign-appearing esophageal stenosis.  Small hiatal hernia.  Gastric stenosis was found at the pylorus. Biopsy negative for malignancy and Candida. -CT A/P: No inguinal hernia, no evidence of retroperitoneal bleed.  Volume overload with bilateral pleural effusions and small volume ascites. -Gastroenterology consulted and appreciated -EGD: Benign appearing esophageal stenosis. Dilated with 5mm TTS balloon without mucosal rent. Small hiatal hernia. Gastritis. Gastric stenosis was found at the pylorus.  -Colonoscopy: preparation of colon was poor. Localized mild inflammation was foun in the distal sigmoid colon- location and linear distribution are suspicious for ischemic colitis. Biopsied.  -Continue protonix 40mg  PO BID, carafate  -Discussed with Dr. Bryan Lemma- feels anemia is not from GI tract, recommended hematology consult. Given that no active bleeding was found and deep ulceration, can restart Eliquis and follow up with Dr. Henrene Pastor. -Discussed with patient, he has been seeing Dr. [name] Rumpf [place]) for anemia.  Acute dsypnea/ Acute on chronic possibly CHF exacerbation/ Right pleural effusion -Possibly secondary to fluid overload given that patient received 2 units PRBC as well as IV fluids and IV medications -Chest x-ray yesterday evening reviewed and shows right-sided pleural effusion, no infection -has been given several doses of lasix- however given AKI, will hold lasix today -last Echocardiogram June 2020- showed EF 26-83%, diastolic parameters were indeterminate -Upon review of chart, patient did have EF 45% on catheretization   -monitor intake/output, daily weights -Continue supplemental oxygen -Obtained repeat CXR today, appears mildly improved when compared to 8/5 CXR. R pleural effusion -s/p R US guided thoracentesis, yielding 1.7L   -S/p L US guided thoracentesis, yielding 650cc  -breathing has improved today, no longer on supplemental  oxygen -Per PT, patient ambulated on room air, oxygen saturations remained stable  Acute kidney injury on chronic kidney disease, stage III -Creatinine on admission 1.93.  Baseline approximately 1.1 (reviewinig the patient's chart, creatinine has been in stage III since 2012) -Creatinine down to 1.38 -Likely secondary to the above -Continue to monitor BMP  Paroxysmal atrial fibrillation -Eliquis held -Continue Cardizem  Essential hypertension -Continue Cardizem  Hypokalemia -Potassium  3.1 -replaced, repeat BMP in one week -Magnesium 2.1   Hyponatremia/Hypochloremia -possibly due to diuresis, improving slowly -appears to be chronic (chart review shows low sodium levels in May 2020)  Consultants Gastroenterology   Procedures  Right and left US guided thoracentesis  EGD Colonoscopy  Discharge Exam: Vitals:   10/15/18 0515 10/15/18 0858  BP: 105/65 109/69  Pulse: 81 77  Resp: 20   Temp: 98.4 F (36.9 C)   SpO2: 98% 100%     General: Well developed, well nourished, NAD, appears stated age  HEENT: NCAT, mucous membranes moist.  Cardiovascular: S1 S2 auscultated, SEM, irregular  Respiratory: Clear to auscultation bilaterally   Abdomen: Soft, nontender, nondistended, + bowel sounds  Extremities: warm dry without cyanosis clubbing. LLE edema > RLE  Neuro: AAOx3, nonfocal  Psych: Appropriate mood and affect, pleasant   Discharge Instructions Discharge Instructions    Discharge instructions   Complete by: As directed    Patient will be discharged to home.  Patient will need to follow up with primary care provider within one week of discharge, repeat CBC and BMP.  Follow up with gastroenterology. Follow up with Dr. [name] Rumpf, hematology.  Patient should continue medications as prescribed.  Patient should follow a heart healthy diet.     Allergies as  of 10/15/2018      Reactions   Codeine Nausea And Vomiting   Sulfa Antibiotics Rash   Sulfonamide Derivatives Rash      Medication List    STOP taking these medications   aspirin 81 MG EC tablet   butalbital-aspirin-caffeine 50-325-40 MG capsule Commonly known as: FIORINAL     TAKE these medications   ALAWAY OP Place 1 drop into the right eye daily. Uses before putting contact Lens in R eye   calcium carbonate 500 MG chewable tablet Commonly known as: TUMS - dosed in mg elemental calcium Chew 1 tablet (200 mg of elemental calcium total) by mouth 3 (three) times daily as needed for indigestion or heartburn.   diltiazem 120 MG 24 hr capsule Commonly known as: Cardizem CD Take 1 capsule (120 mg total) by mouth daily.   diltiazem 30 MG tablet Commonly known as: Cardizem Take 1 tablet (30 mg total) by mouth as needed (for heart rate greater than 120).   dutasteride 0.5 MG capsule Commonly known as: AVODART Take 0.5 mg by mouth daily.   Eliquis 5 MG Tabs tablet Generic drug: apixaban TAKE 1 TABLET BY MOUTH TWICE A DAY What changed: how much to take   esomeprazole 40 MG capsule Commonly known as: NexIUM Take 1 capsule (40 mg total) by mouth 2 (two) times daily before a meal.   ezetimibe 10 MG tablet Commonly known as: ZETIA TAKE 1 TABLET (10 MG TOTAL) BY MOUTH DAILY. What changed: See the new instructions.   finasteride 5 MG tablet Commonly known as: PROSCAR Take 5 mg by mouth daily.   Horizant 600 MG Tbcr Generic drug: Gabapentin Enacarbil Take by mouth at bedtime.   loratadine 10 MG tablet Commonly known as: CLARITIN Take 10 mg by mouth daily  as needed for allergies, rhinitis or itching.   lovastatin 40 MG tablet Commonly known as: MEVACOR Take 20 mg by mouth at bedtime.   metolazone 2.5 MG tablet Commonly known as: ZAROXOLYN Take 2.5 mg by mouth 2 (two) times a week. Monday, Friday   multivitamins ther. w/minerals Tabs tablet Take 1 tablet by mouth  daily.   nitroGLYCERIN 0.4 MG/SPRAY spray Commonly known as: NITROLINGUAL Place 1 spray under the tongue every 5 (five) minutes x 3 doses as needed for chest pain.   Ranexa 1000 MG SR tablet Generic drug: ranolazine Take 500 mg by mouth 2 (two) times daily.   senna-docusate 8.6-50 MG tablet Commonly known as: Senokot-S Take 1 tablet by mouth daily.   sucralfate 1 GM/10ML suspension Commonly known as: CARAFATE Take 10 mLs (1 g total) by mouth 4 (four) times daily -  with meals and at bedtime.   torsemide 20 MG tablet Commonly known as: DEMADEX Take 1 tablet (20 mg total) by mouth 2 (two) times daily. FOR SWELLING OR WEIGHT GAIN   vitamin E 400 UNIT capsule Take 400 Units by mouth daily.      Allergies  Allergen Reactions   Codeine Nausea And Vomiting   Sulfa Antibiotics Rash   Sulfonamide Derivatives Rash      The results of significant diagnostics from this hospitalization (including imaging, microbiology, ancillary and laboratory) are listed below for reference.    Significant Diagnostic Studies: Ct Abdomen Pelvis Wo Contrast  Result Date: 10/10/2018 CLINICAL DATA:  Acute anemia. Bulge in the right groin. EXAM: CT ABDOMEN AND PELVIS WITHOUT CONTRAST TECHNIQUE: Multidetector CT imaging of the abdomen and pelvis was performed following the standard protocol without IV contrast. COMPARISON:  CT scan dated 07/13/2018 and chest x-ray dated 10/04/2018 FINDINGS: Lower chest: Patient has developed a moderate right pleural effusion, increased since 09/04/2018, and a small left pleural effusion. There is compressive atelectasis in the right lower lobe. Heart size is normal. Coronary artery calcifications. Hepatobiliary: No focal liver abnormality is seen. No gallstones, gallbladder wall thickening, or biliary dilatation. Pancreas: Unremarkable. No pancreatic ductal dilatation or surrounding inflammatory changes. Spleen: Normal in size without focal abnormality. Adrenals/Urinary  Tract: Adrenal glands are unremarkable. Kidneys are normal, without renal calculi, focal lesion, or hydronephrosis. Bladder is unremarkable. Stomach/Bowel: There is a right inguinal hernia containing fluid and a small loop of small bowel. No dilatation of the bowel within, proximal, or distal to the hernia. This is new since the prior study. There is also a small left inguinal hernia containing only fluid. The bowel otherwise appears normal. Appendix has been removed. Vascular/Lymphatic: Extensive aortic atherosclerosis. No adenopathy. Reproductive: Prostate gland is unremarkable. Large left hydrocele. Small right hydrocele. Other: Small amount of ascites around the liver and in the pericolic gutters and in the pelvis. Diffuse mild anasarca, increased since the prior study. Musculoskeletal: No acute abnormality. IMPRESSION: 1. New right inguinal hernia containing fluid and a small loop of small bowel without obstruction. No evidence of inguinal or retroperitoneal or intra-abdominal hematoma. 2. Small left inguinal hernia containing only fluid. 3. Increased bilateral pleural effusions, right greater than left. 4. Small amount of ascites.  Increased anasarca. Aortic Atherosclerosis (ICD10-I70.0). Electronically Signed   By: Lorriane Shire M.D.   On: 10/10/2018 14:57   Dg Chest 1 View  Result Date: 10/13/2018 CLINICAL DATA:  Status post left thoracentesis EXAM: CHEST  1 VIEW COMPARISON:  Chest radiograph from one day prior. FINDINGS: Intact sternotomy wires. CABG clips overlie the mediastinum. Stable cardiomediastinal  silhouette with mild cardiomegaly. No pneumothorax. No residual left pleural effusion. Stable small right pleural effusion. Emphysema. No overt pulmonary edema. Mild right basilar scarring versus atelectasis. IMPRESSION: 1. No pneumothorax.  No residual left pleural effusion. 2. Stable small right pleural effusion. 3. Emphysema. 4. Mild right basilar scarring versus atelectasis. 5. Cardiomegaly.  Electronically Signed   By: Ilona Sorrel M.D.   On: 10/13/2018 10:30   Dg Chest 1 View  Result Date: 10/12/2018 CLINICAL DATA:  Thoracentesis. EXAM: CHEST  1 VIEW COMPARISON:  Earlier the same day FINDINGS: Right thoracentesis. There are now trace bilateral pleural effusions. No visible pneumothorax or re-expansion edema. Cardiomegaly. CABG. IMPRESSION: No complicating feature after thoracentesis. Electronically Signed   By: Monte Fantasia M.D.   On: 10/12/2018 10:23   Dg Chest Port 1 View  Result Date: 10/12/2018 CLINICAL DATA:  73 year old male with a history of shortness of breath EXAM: PORTABLE CHEST 1 VIEW COMPARISON:  October 10, 2018 FINDINGS: Cardiomediastinal silhouette unchanged in size and contour with surgical changes of median sternotomy and CABG. Opacity at the right lung base with obscuration of the right hemidiaphragm and the right heart border. Blunting of the left costophrenic angle is new from the comparison. Coarsened interstitial markings, with interlobular septal thickening. No visualized pneumothorax. IMPRESSION: Persisting right basilar opacity likely a combination of pleural effusion with atelectasis/consolidation. New small left pleural effusion. Mild edema superimposed on chronic changes. Surgical changes of median sternotomy and CABG. Electronically Signed   By: Corrie Mckusick D.O.   On: 10/12/2018 09:02   Dg Chest Port 1 View  Result Date: 10/10/2018 CLINICAL DATA:  Wheezing EXAM: PORTABLE CHEST 1 VIEW COMPARISON:  10/04/2018 FINDINGS: Cardiac shadow is mildly enlarged but stable. Postsurgical changes are seen. Enlarging right-sided pleural effusion is noted with underlying atelectatic changes. The left lung is clear. IMPRESSION: Increasing right-sided pleural effusion with underlying atelectatic changes. Electronically Signed   By: Inez Catalina M.D.   On: 10/10/2018 23:55   Ir Thoracentesis Asp Pleural Space W/img Guide  Result Date: 10/12/2018 INDICATION: Patient presents  with volume overload, bilateral pleural effusions. Request is made for therapeutic thoracentesis. EXAM: ULTRASOUND GUIDED THERAPEUTIC RIGHT THORACENTESIS MEDICATIONS: 10 mL 1% lidocaine COMPLICATIONS: None immediate. PROCEDURE: An ultrasound guided thoracentesis was thoroughly discussed with the patient and questions answered. The benefits, risks, alternatives and complications were also discussed. The patient understands and wishes to proceed with the procedure. Written consent was obtained. Ultrasound was performed to localize and mark an adequate pocket of fluid in the right chest. The area was then prepped and draped in the normal sterile fashion. 1% Lidocaine was used for local anesthesia. Under ultrasound guidance a 6 Fr Safe-T-Centesis catheter was introduced. Thoracentesis was performed. The catheter was removed and a dressing applied. FINDINGS: A total of approximately 1.7 liters of yellow, hazy fluid was removed. Samples were sent to the laboratory as requested by the clinical team. IMPRESSION: Successful ultrasound guided therapeutic right thoracentesis yielding 1.7 liters of pleural fluid. Ready by: Brynda Greathouse PA-C Electronically Signed   By: Markus Daft M.D.   On: 10/12/2018 15:00   US Thoracentesis Asp Pleural Space W/img Guide  Result Date: 10/13/2018 INDICATION: Patient with history of AKI, acute on chronic HF/possible HF exacerbation, dyspnea, and bilateral pleural effusions s/p right thoracentesis 10/12/2018 yielding 1.7 L. Request is made for diagnostic and therapeutic left thoracentesis. EXAM: ULTRASOUND GUIDED DIAGNOSTIC AND THERAPEUTIC LEFT THORACENTESIS MEDICATIONS: 10 mL 1% lidocaine COMPLICATIONS: None immediate. PROCEDURE: An ultrasound guided thoracentesis was thoroughly discussed  with the patient and questions answered. The benefits, risks, alternatives and complications were also discussed. The patient understands and wishes to proceed with the procedure. Written consent was  obtained. Ultrasound was performed to localize and mark an adequate pocket of fluid in the left chest. The area was then prepped and draped in the normal sterile fashion. 1% Lidocaine was used for local anesthesia. Under ultrasound guidance a 6 Fr Safe-T-Centesis catheter was introduced. Thoracentesis was performed. The catheter was removed and a dressing applied. FINDINGS: A total of approximately 650 mL of clear gold fluid was removed. Samples were sent to the laboratory as requested by the clinical team. IMPRESSION: Successful ultrasound guided left thoracentesis yielding 650 mL of pleural fluid. Read by: Earley Abide, PA-C Electronically Signed   By: Corrie Mckusick D.O.   On: 10/13/2018 10:35    Microbiology: Recent Results (from the past 240 hour(s))  SARS Coronavirus 2 [facility] order, Performed in [place] hospital lab)     Status: None   Collection Time: 10/10/18  4:09 AM  Result Value Ref Range Status   SARS Coronavirus 2 NEGATIVE NEGATIVE Final    Comment: (NOTE) If result is NEGATIVE SARS-CoV-2 target nucleic acids are NOT DETECTED. The SARS-CoV-2 RNA is generally detectable in upper and lower  respiratory specimens during the acute phase of infection. The lowest  concentration of SARS-CoV-2 viral copies this assay can detect is 250  copies / mL. A negative result does not preclude SARS-CoV-2 infection  and should not be used as the sole basis for treatment or other  patient management decisions.  A negative result may occur with  improper specimen collection / handling, submission of specimen other  than nasopharyngeal swab, presence of viral mutation(s) within the  areas targeted by this assay, and inadequate number of viral copies  (<250 copies / mL). A negative result must be combined with clinical  observations, patient history, and epidemiological information. If result is POSITIVE SARS-CoV-2 target nucleic acids are DETECTED. The SARS-CoV-2 RNA is generally detectable  in upper and lower  respiratory specimens dur ing the acute phase of infection.  Positive  results are indicative of active infection with SARS-CoV-2.  Clinical  correlation with patient history and other diagnostic information is  necessary to determine patient infection status.  Positive results do  not rule out bacterial infection or co-infection with other viruses. If result is PRESUMPTIVE POSTIVE SARS-CoV-2 nucleic acids MAY BE PRESENT.   A presumptive positive result was obtained on the submitted specimen  and confirmed on repeat testing.  While 2019 novel coronavirus  (SARS-CoV-2) nucleic acids may be present in the submitted sample  additional confirmatory testing may be necessary for epidemiological  and / or clinical management purposes  to differentiate between  SARS-CoV-2 and other Sarbecovirus currently known to infect humans.  If clinically indicated additional testing with an alternate test  methodology [phone]) is advised. The SARS-CoV-2 RNA is generally  detectable in upper and lower respiratory sp ecimens during the acute  phase of infection. The expected result is Negative. Fact Sheet for Patients:  StrictlyIdeas.no Fact Sheet for Healthcare Providers: BankingDealers.co.za This test is not yet approved or cleared by the [place] FDA and has been authorized for detection and/or diagnosis of SARS-CoV-2 by FDA under an Emergency Use Authorization (EUA).  This EUA will remain in effect (meaning this test can be used) for the duration of the COVID-19 declaration under Section 564(b)(1) of the Act, 21 U.S.C. section 360bbb-3(b)(1), unless the authorization is  terminated or revoked sooner. Performed at [facility], [place] [address], [place], [place] [address]   Gram stain     Status: None   Collection Time: 10/13/18  9:57 AM   Specimen: Pleural, Left; Pleural Fluid  Result Value Ref Range Status   Specimen  Description PLEURAL LEFT  Final   Special Requests NONE  Final   Gram Stain   Final    MODERATE WBC PRESENT,BOTH PMN AND MONONUCLEAR NO ORGANISMS SEEN Performed at [facility], [place] [address] [place], [place] [address]    Report Status 10/13/2018 FINAL  Final  Culture, body fluid-bottle     Status: None (Preliminary result)   Collection Time: 10/13/18  9:57 AM   Specimen: Pleura  Result Value Ref Range Status   Specimen Description PLEURAL LEFT  Final   Special Requests NONE  Final   Culture   Final    NO GROWTH 1 DAY Performed at [facility], [place] [address], [place], [place] [address]    Report Status PENDING  Incomplete     Labs: Basic Metabolic Panel: Recent Labs  Lab 10/10/18 0528  10/11/18 1505 10/12/18 0520 10/13/18 0244 10/14/18 0521 10/15/18 0628  NA 128*   < > 125* 126* 127* 129* 129*  K 2.4*   < > 3.1* 3.4* 3.8 3.4* 3.1*  CL 87*   < > 85* 87* 89* 88* 90*  CO2 26   < > 27 29 28 26 29   GLUCOSE 145*   < > 142* 114* 104* 109* 103*  BUN 44*   < > 32* 29* 23 21 18   CREATININE 1.93*   < > 1.53* 1.64* 1.30* 1.38* 1.33*  CALCIUM 8.2*   < > 8.2* 8.4* 8.4* 8.5* 8.4*  MG 2.1  --   --   --   --   --   --    < > = values in this interval not displayed.   Liver Function Tests: Recent Labs  Lab 10/10/18 0528 10/12/18 0520  AST 50*  --   ALT 31  --   ALKPHOS 112  --   BILITOT 0.8  --   PROT 5.7* 6.0*  ALBUMIN 2.9*  --    No results for input(s): LIPASE, AMYLASE in the last 168 hours. No results for input(s): AMMONIA in the last 168 hours. CBC: Recent Labs  Lab 10/10/18 0528 10/11/18 0154 10/12/18 0520 10/13/18 0244 10/14/18 0521 10/15/18 0628  WBC 6.5 6.0 6.7 6.8  --   --   HGB 6.7* 9.1* 9.0* 8.7* 8.9* 8.2*  HCT 20.7* 27.0* 25.8* 25.4* 27.3* 24.9*  MCV 85.9 83.6 82.4 83.3  --   --   PLT 168 160 139* 130*  --   --    Cardiac Enzymes: No results for input(s): CKTOTAL, CKMB, CKMBINDEX, TROPONINI in the last 168 hours. BNP: BNP (last 3  results) Recent Labs    07/23/18 2026  BNP 1,122.4*    ProBNP (last 3 results) No results for input(s): PROBNP in the last 8760 hours.  CBG: Recent Labs  Lab 10/14/18 0850  GLUCAP 109*       Signed:  Lidiya Reise  [facility] 10/15/2018, 10:49 AM

## 2018-10-16 NOTE — Progress Notes (Signed)
Patient called back today-  Concerned he did not have any potassium on medication list.   Called MD- per MD concern since patient is only taking fluid pill as needed we did not want to have his potassium get too high after replacing potassium in the hospital.    Called and let patient know.

## 2018-10-17 ENCOUNTER — Other Ambulatory Visit: Payer: Self-pay

## 2018-10-17 DIAGNOSIS — I739 Peripheral vascular disease, unspecified: Secondary | ICD-10-CM

## 2018-10-17 DIAGNOSIS — I6523 Occlusion and stenosis of bilateral carotid arteries: Secondary | ICD-10-CM

## 2018-10-18 LAB — CULTURE, BODY FLUID W GRAM STAIN -BOTTLE: Culture: NO GROWTH

## 2018-10-23 ENCOUNTER — Encounter: Payer: Self-pay | Admitting: Gastroenterology

## 2018-10-25 ENCOUNTER — Telehealth (HOSPITAL_COMMUNITY): Payer: Self-pay | Admitting: Rehabilitation

## 2018-10-25 NOTE — Telephone Encounter (Signed)

## 2018-10-26 ENCOUNTER — Ambulatory Visit (HOSPITAL_COMMUNITY)
Admission: RE | Admit: 2018-10-26 | Discharge: 2018-10-26 | Disposition: A | Discharge status: Home / Self Care | Payer: Medicare Other | Source: Ambulatory Visit | Attending: Family | Admitting: Family

## 2018-10-26 ENCOUNTER — Other Ambulatory Visit: Payer: Self-pay

## 2018-10-26 ENCOUNTER — Ambulatory Visit (INDEPENDENT_AMBULATORY_CARE_PROVIDER_SITE_OTHER)
Admission: RE | Admit: 2018-10-26 | Discharge: 2018-10-26 | Disposition: A | Discharge status: Home / Self Care | Payer: Medicare Other | Source: Ambulatory Visit | Attending: Family | Admitting: Family

## 2018-10-26 ENCOUNTER — Ambulatory Visit: Payer: Medicare Other | Admitting: Family

## 2018-10-26 DIAGNOSIS — I6523 Occlusion and stenosis of bilateral carotid arteries: Secondary | ICD-10-CM

## 2018-10-26 DIAGNOSIS — I739 Peripheral vascular disease, unspecified: Secondary | ICD-10-CM | POA: Insufficient documentation

## 2018-10-30 DIAGNOSIS — D649 Anemia, unspecified: Secondary | ICD-10-CM

## 2018-10-31 ENCOUNTER — Encounter: Payer: Self-pay | Admitting: Family

## 2018-10-31 ENCOUNTER — Other Ambulatory Visit: Payer: Self-pay

## 2018-10-31 ENCOUNTER — Ambulatory Visit (INDEPENDENT_AMBULATORY_CARE_PROVIDER_SITE_OTHER): Payer: Medicare Other | Admitting: Family

## 2018-10-31 DIAGNOSIS — I771 Stricture of artery: Secondary | ICD-10-CM | POA: Diagnosis not present

## 2018-10-31 DIAGNOSIS — I6523 Occlusion and stenosis of bilateral carotid arteries: Secondary | ICD-10-CM

## 2018-10-31 DIAGNOSIS — K551 Chronic vascular disorders of intestine: Secondary | ICD-10-CM | POA: Diagnosis not present

## 2018-10-31 DIAGNOSIS — Z87891 Personal history of nicotine dependence: Secondary | ICD-10-CM

## 2018-10-31 DIAGNOSIS — I739 Peripheral vascular disease, unspecified: Secondary | ICD-10-CM | POA: Diagnosis not present

## 2018-10-31 DIAGNOSIS — Z9889 Other specified postprocedural states: Secondary | ICD-10-CM

## 2018-10-31 NOTE — Progress Notes (Signed)
Virtual Visit via Telephone Note  I connected with DEVONNE LALANI on 10/31/2018 using the [employer] by telephone and verified that I was speaking with the correct person using two identifiers. Patient was located at his home and accompanied by himself. I am located at the [facility] office/clinic.   The limitations of evaluation and management by telemedicine and the availability of in person appointments have been previously discussed with the patient and are documented in the patients chart. The patient expressed understanding and consented to proceed.  PCP: Garwin Brothers, MD  Chief Complaint: follow up   History of Present Illness: [name] is a 73 y.o. male who is s/p celiac artery stenting and superior mesenteric artery stenting on 07-16-18 by Dr. Oneida Alar for chronic mesenteric ischemia.   He has also had a carotid endarterectomy and redo by Dr.Early in the remote past.   Dr. Oneida Alar last evaluated pt on 08-23-18. At that time extremities: 2+ femoral pulses bilaterally no obvious hematoma Duplex ultrasound of his mesenteric vessels that day was suboptimal due to acoustic shadowing. The stents were patent but it was difficult to determine how much stenosis might be present. He was doing well status post celiac and superior mesenteric artery stenting, was asymptomatic. The patient was toifollow-up in 3 months with a CTA of the abdomen and pelvis at that time since we were not able to adequately visualize his stents that day on duplex exam. Patient was to call sooner if he develops any recurrent abdominal symptoms. Continue his statin and aspirin. He is on Eliquis for a long-term chronic atrial fibrillation.  Pt states the hematoma in his left groin mostly resolved.   He reports fluid in his legs, helped with diuretics.   He does have known superficial femoral artery occlusive disease, but he no longer has claudication symptoms in his legs.  He denies any history of stroke or TIA,  specifically he denies a history of amaurosis fugax or monocular blindness,unilateralfacial drooping, hemiplegia, orreceptive or expressive aphasia.   He denies any steal symptoms in either upper extremity, denies cold sensation in either upper extremity, denies pain in either extremity. He denies feeling dizzy, but does feel off balance if he turns quickly; states he has tinnitus. He has been evaluated by an ENT in [facility] re this.   He also reports that he has known sacral, lumbar, and thoracic "bulging discs", degenerative disc disease, found on CT of his spine, per patient. He also did years of heavy lifting as an EMT. He also reports that he has scoliosis.  Hehad been doingcardiac rehab/wellness program at [facility] days/week since 2005, states this has helped his PAD, he no longer has claudication symptoms in his legs with walking. He stopped cardiac rehab due to chest pain with increased heart rate.  But he states his cardiologist said he could resume but not exercise hard enough to elicit chest pain. Pt states that his cardiologist is monitoring his aorta, iliac arteries, renal arteries, and checks ABI's regularly.   Ptstateshe had a cardiac stent placedin 2012; states in 1996 his LIMA was used as a bypass vessel. He had 4 vessel CABG in the 1980's and again in 1996.  He hasparoxysmal atrial fib,takes amiodarone and metoprolol prescribed by Dr. Zeb Comfort was being considered for cardioversion by Dr. Curt Bears.  He reports dizziness since about 2015 or 2016 that is stable.  He is seeing a hematologist in [place] re anemia.  He is dyspneic and fatigued. He states GI  work up showed no GI bleeding.  His legs feel weak when he walks.  He denies post prandial abdominal pain.  He fractured his right hip in April 2020, after that he developed pneumonia and had an MI.  He takes torsemide for his CHF, has significant dependent edema.  His legs feel weak with  walking, he walks with a walker.    Diabetic: No Tobaccos use: former smoker, quit in 2006  Pt meds include: Statin : Yes ASA:no Other anticoagulants/antiplatelets:Eliquis for atrial fib, 8 cardiac stents, 2 mesenteric stents, I renal artery stent   Past Medical History:  Diagnosis Date  . Anemia   . Angina   . Atrial fibrillation ([place])   . Blood transfusion   . Bronchitis   . Bulging discs    "8 of them; thoracic, lumbar, sacral area"  . Chronic back pain   . Colon polyps   . Complication of anesthesia   . Coronary artery disease 01/24/11   Successful PCI long segmental stensois  vein graft to the CX marginal vessel-graft previously stented prox. 3.0x42mm TAXUS stent mid seg 3.0x53mm TAXUS stent now tandem stens of 3.0x81mm & 3.0x79mm placed with the seg. 50,80 & 99% stenosis reduced to 0%  . DDD (degenerative disc disease)   . Diastolic heart failure ([place])   . Dysrhythmia    "PAC's and PVC's"  . Esophageal dilatation    "have had it done 7 times; last time ~ 2001"  . Esophageal stricture   . GERD (gastroesophageal reflux disease)   . Headache(784.0)   . Hiatal hernia   . Hyperlipidemia   . Myocardial infarction ([place]) 1991  . Myocardial infarction [facility]) 2005   "had 3 heart attacks this year"  . Paroxysmal atrial fibrillation ([facility])   . Pyloric stenosis   . RBBB   . Renal artery stenosis ([facility])    hhistory of right renal artery stenting and re-intervention for "in-stent restenosis.  . S/P CABG (coronary artery bypass graft) [place]  . Shortness of breath    "when I had the heart attacks"  . Subclavian artery stenosis, left ([place])   . Ulcer    "small; Dr. Henrene Pastor found it 02/2010"    Past Surgical History:  Procedure Laterality Date  . ADENOIDECTOMY     "when I was an infant"  . APPENDECTOMY  1953  . BALLOON DILATION N/A 10/14/2018   Procedure: BALLOON DILATION;  Surgeon: Lavena Bullion, DO;  Location: [place];   Service: Gastroenterology;  Laterality: N/A;  . BIOPSY  07/26/2018   Procedure: BIOPSY;  Surgeon: Thornton Park, MD;  Location: [place];  Service: Gastroenterology;;  . BIOPSY  10/14/2018   Procedure: BIOPSY;  Surgeon: Lavena Bullion, DO;  Location: [place];  Service: Gastroenterology;;  . CARDIOVERSION N/A 04/17/2017   Procedure: CARDIOVERSION;  Surgeon: Jerline Pain, MD;  Location: [facility] ENDOSCOPY;  Service: Cardiovascular;  Laterality: N/A;  . CAROTID ENDARTERECTOMY Right 1996   CE  . COLONOSCOPY WITH PROPOFOL N/A 10/14/2018   Procedure: COLONOSCOPY WITH PROPOFOL;  Surgeon: Lavena Bullion, DO;  Location: [place];  Service: Gastroenterology;  Laterality: N/A;  . CORONARY ANGIOPLASTY WITH STENT PLACEMENT  01/24/11   "today makes a total of 9 stents"  . CORONARY ARTERY BYPASS GRAFT  1984   CABG X 4  . CORONARY ARTERY BYPASS GRAFT  1996   CABG X 4  . ESOPHAGOGASTRODUODENOSCOPY (EGD) WITH PROPOFOL N/A 07/26/2018   Procedure: ESOPHAGOGASTRODUODENOSCOPY (  EGD) WITH PROPOFOL;  Surgeon: Thornton Park, MD;  Location: [place];  Service: Gastroenterology;  Laterality: N/A;  . ESOPHAGOGASTRODUODENOSCOPY (EGD) WITH PROPOFOL N/A 10/14/2018   Procedure: ESOPHAGOGASTRODUODENOSCOPY (EGD) WITH PROPOFOL;  Surgeon: Lavena Bullion, DO;  Location: [place];  Service: Gastroenterology;  Laterality: N/A;  . IR THORACENTESIS ASP PLEURAL SPACE W/IMG GUIDE  10/12/2018  . LEFT HEART CATHETERIZATION WITH CORONARY/GRAFT ANGIOGRAM N/A 01/24/2011   Procedure: LEFT HEART CATHETERIZATION WITH Beatrix Fetters;  Surgeon: Troy Sine, MD;  Location: [facility] CATH LAB;  Service: Cardiovascular;  Laterality: N/A;  . PERIPHERAL VASCULAR INTERVENTION  07/16/2018   Procedure: PERIPHERAL VASCULAR INTERVENTION;  Surgeon: Elam Dutch, MD;  Location: [place] CV LAB;  Service: Cardiovascular;;  SMA and Celiac  . pylondial cyst removal    . RETINAL DETACHMENT SURGERY  04/2007   right  .  TONSILLECTOMY  1972  . VISCERAL ANGIOGRAPHY N/A 07/16/2018   Procedure: VISCERAL ANGIOGRAPHY;  Surgeon: Elam Dutch, MD;  Location: [place] CV LAB;  Service: Cardiovascular;  Laterality: N/A;    Current Meds  Medication Sig  . calcium carbonate (TUMS - DOSED IN MG ELEMENTAL CALCIUM) 500 MG chewable tablet Chew 1 tablet (200 mg of elemental calcium total) by mouth 3 (three) times daily as needed for indigestion or heartburn.  . diltiazem (CARDIZEM CD) 120 MG 24 hr capsule Take 1 capsule (120 mg total) by mouth daily.  Marland Kitchen diltiazem (CARDIZEM) 30 MG tablet Take 1 tablet (30 mg total) by mouth as needed (for heart rate greater than 120).  Marland Kitchen dutasteride (AVODART) 0.5 MG capsule Take 0.5 mg by mouth daily.   Marland Kitchen ELIQUIS 5 MG TABS tablet TAKE 1 TABLET BY MOUTH TWICE A DAY (Patient taking differently: Take 5 mg by mouth 2 (two) times daily. )  . esomeprazole (NEXIUM) 40 MG capsule Take 1 capsule (40 mg total) by mouth 2 (two) times daily before a meal.  . ezetimibe (ZETIA) 10 MG tablet TAKE 1 TABLET (10 MG TOTAL) BY MOUTH DAILY. (Patient taking differently: Take 10 mg by mouth daily. )  . finasteride (PROSCAR) 5 MG tablet Take 5 mg by mouth daily.  . Gabapentin Enacarbil (HORIZANT) 600 MG TBCR Take by mouth at bedtime.  Marland Kitchen Ketotifen Fumarate (ALAWAY OP) Place 1 drop into the right eye daily. Uses before putting contact Lens in R eye  . loratadine (CLARITIN) 10 MG tablet Take 10 mg by mouth daily as needed for allergies, rhinitis or itching.   . lovastatin (MEVACOR) 40 MG tablet Take 20 mg by mouth at bedtime.  . metolazone (ZAROXOLYN) 2.5 MG tablet Take 2.5 mg by mouth 2 (two) times a week. Monday, Friday  . Multiple Vitamins-Minerals (MULTIVITAMINS THER. W/MINERALS) TABS Take 1 tablet by mouth daily.    . nitroGLYCERIN (NITROLINGUAL) 0.4 MG/SPRAY spray Place 1 spray under the tongue every 5 (five) minutes x 3 doses as needed for chest pain.  . ranolazine (RANEXA) 1000 MG SR tablet Take 500 mg by  mouth 2 (two) times daily.  Marland Kitchen senna-docusate (SENOKOT-S) 8.6-50 MG tablet Take 1 tablet by mouth daily.  Marland Kitchen torsemide (DEMADEX) 20 MG tablet Take 1 tablet (20 mg total) by mouth 2 (two) times daily. FOR SWELLING OR WEIGHT GAIN  . vitamin E 400 UNIT capsule Take 400 Units by mouth daily.     12 system ROS was negative unless otherwise noted in HPI   Observations/Objective:  DATA  Carotid Duplex (10-26-18): Right Carotid: Velocities in the right ICA are consistent with  a 40-59%                stenosis. The ECA appears <50% stenosed. Left Carotid: Velocities in the left ICA are consistent with a 40-59% stenosis.               The ECA appears <50% stenosed. Vertebrals:  Right vertebral artery demonstrates antegrade flow. Left vertebral              artery demonstrates retrograde flow. Subclavians: Bilateral subclavian arteries were stenotic. No significant change compared to the exam on 04-27-18.    ABI Findings (10-26-18): +---------+------------------+-----+-------------------+--------+ Right    Rt Pressure (mmHg)IndexWaveform           Comment  +---------+------------------+-----+-------------------+--------+ Brachial 134                                                +---------+------------------+-----+-------------------+--------+ PTA      83                0.62 dampened monophasic         +---------+------------------+-----+-------------------+--------+ DP       74                0.55 dampened monophasic         +---------+------------------+-----+-------------------+--------+ Great Toe47                0.35 Abnormal                    +---------+------------------+-----+-------------------+--------+  +---------+------------------+-----+-------------------+-------+ Left     Lt Pressure (mmHg)IndexWaveform           Comment +---------+------------------+-----+-------------------+-------+ Brachial 94                                                 +---------+------------------+-----+-------------------+-------+ PTA      88                0.66 dampened monophasic        +---------+------------------+-----+-------------------+-------+ DP       104               0.78 dampened monophasic        +---------+------------------+-----+-------------------+-------+ Great Toe72                0.54 Abnormal                   +---------+------------------+-----+-------------------+-------+  +-------+-----------+-----------+------------+------------+ ABI/TBIToday's ABIToday's TBIPrevious ABIPrevious TBI +-------+-----------+-----------+------------+------------+ Right  0.62       0.35                                +-------+-----------+-----------+------------+------------+ Left   0.78       0.54                                +-------+-----------+-----------+------------+------------+ Summary: Right: Resting right ankle-brachial index indicates moderate right lower extremity arterial disease. The right toe-brachial index is abnormal.  Left: Resting left ankle-brachial index indicates moderate left lower extremity arterial disease. The left toe-brachial index is abnormal.  ABIs may be falsely elevated due to calcific arteries  Assessment and Plan: MELANIE PELLOT is a 73 y.o. male who is s/p celiac artery stenting and superior mesenteric artery stenting on 07-16-18 by Dr. Oneida Alar for chronic mesenteric ischemia.   The hematoma in his left groin has mostly resolved.   Dr. Oneida Alar advised  CTA of the abdomen and pelvis in 3 months after his June 2020 visit with him, since we were not able to adequately visualize his celiac and SMA stents that day on duplex exam.   He has no post prandial abdominal pain.  He is frail, had an MI and pneumonia after he fractured his hip in April 2020.  He has dependent edema.   His most recent serum creatinine was 1.33 on 10-15-18. I discussed with Dr. Oneida Alar pt  frail health but adequate serum creatinine. See Plan.   Follow Up Instructions:  Will schedule CTA abd/pelvis in September 2020 to evaluate his celiac and SMA stents not adequately visualized on duplex.   ABI's in 6 months, sooner of symptomatic. Carotid duplex in a year.  I discussed the assessment and treatment plan with the patient. The patient was provided an opportunity to ask questions and all were answered. The patient agreed with the plan and demonstrated an understanding of the instructions.   The patient was advised to call back or seek an in-person evaluation if the symptoms worsen or if the condition fails to improve as anticipated.  I spent 15 minutes with the patient via telephone encounter.   Gabrielle Dare [facility]e [facility] Office: [phone]  10/31/2018, 10:43 AM

## 2018-10-31 NOTE — Patient Instructions (Signed)
Stroke Prevention Some medical conditions and lifestyle choices can lead to a higher risk for a stroke. You can help to prevent a stroke by making nutrition, lifestyle, and other changes. What nutrition changes can be made?   Eat healthy foods. ? Choose foods that are high in fiber. These include:  Fresh fruits.  Fresh vegetables.  Whole grains. ? Eat at least 5 or more servings of fruits and vegetables each day. Try to fill half of your plate at each meal with fruits and vegetables. ? Choose lean protein foods. These include:  Lowfat (lean) cuts of meat.  Chicken without skin.  Fish.  Tofu.  Beans.  Nuts. ? Eat low-fat dairy products. ? Avoid foods that:  Are high in salt (sodium).  Have saturated fat.  Have trans fat.  Have cholesterol.  Are processed.  Are premade.  Follow eating guidelines as told by your doctor. These may include: ? Reducing how many calories you eat and drink each day. ? Limiting how much salt you eat or drink each day to 1,500 milligrams (mg). ? Using only healthy fats for cooking. These include:  Olive oil.  Canola oil.  Sunflower oil. ? Counting how many carbohydrates you eat and drink each day. What lifestyle changes can be made?  Try to stay at a healthy weight. Talk to your doctor about what a good weight is for you.  Get at least 30 minutes of moderate physical activity at least 5 days a week. This can include: ? Fast walking. ? Biking. ? Swimming.  Do not use any products that have nicotine or tobacco. This includes cigarettes and e-cigarettes. If you need help quitting, ask your doctor. Avoid being around tobacco smoke in general.  Limit how much alcohol you drink to no more than 1 drink a day for nonpregnant women and 2 drinks a day for men. One drink equals 12 oz of beer, 5 oz of wine, or 1 oz of hard liquor.  Do not use drugs.  Avoid taking birth control pills. Talk to your doctor about the risks of taking birth  control pills if: ? You are over 55 years old. ? You smoke. ? You get migraines. ? You have had a blood clot. What other changes can be made?  Manage your cholesterol. ? It is important to eat a healthy diet. ? If your cholesterol cannot be managed through your diet, you may also need to take medicines. Take medicines as told by your doctor.  Manage your diabetes. ? It is important to eat a healthy diet and to exercise regularly. ? If your blood sugar cannot be managed through diet and exercise, you may need to take medicines. Take medicines as told by your doctor.  Control your high blood pressure (hypertension). ? Try to keep your blood pressure below 130/80. This can help lower your risk of stroke. ? It is important to eat a healthy diet and to exercise regularly. ? If your blood pressure cannot be managed through diet and exercise, you may need to take medicines. Take medicines as told by your doctor. ? Ask your doctor if you should check your blood pressure at home. ? Have your blood pressure checked every year. Do this even if your blood pressure is normal.  Talk to your doctor about getting checked for a sleep disorder. Signs of this can include: ? Snoring a lot. ? Feeling very tired.  Take over-the-counter and prescription medicines only as told by your doctor. These may  include aspirin or blood thinners (antiplatelets or anticoagulants).  Make sure that any other medical conditions you have are managed. Where to find more information  American Stroke Association: www.strokeassociation.org  National Stroke Association: www.stroke.org Get help right away if:  You have any symptoms of stroke. "BE FAST" is an easy way to remember the main warning signs: ? B - Balance. Signs are dizziness, sudden trouble walking, or loss of balance. ? E - Eyes. Signs are trouble seeing or a sudden change in how you see. ? F - Face. Signs are sudden weakness or loss of feeling of the face,  or the face or eyelid drooping on one side. ? A - Arms. Signs are weakness or loss of feeling in an arm. This happens suddenly and usually on one side of the body. ? S - Speech. Signs are sudden trouble speaking, slurred speech, or trouble understanding what people say. ? T - Time. Time to call emergency services. Write down what time symptoms started.  You have other signs of stroke, such as: ? A sudden, very bad headache with no known cause. ? Feeling sick to your stomach (nausea). ? Throwing up (vomiting). ? Jerky movements you cannot control (seizure). These symptoms may represent a serious problem that is an emergency. Do not wait to see if the symptoms will go away. Get medical help right away. Call your local emergency services (911 in the [place] Do not drive yourself to the hospital. Summary  You can prevent a stroke by eating healthy, exercising, not smoking, drinking less alcohol, and treating other health problems, such as diabetes, high blood pressure, or high cholesterol.  Do not use any products that contain nicotine or tobacco, such as cigarettes and e-cigarettes.  Get help right away if you have any signs or symptoms of a stroke. This information is not intended to replace advice given to you by your health care provider. Make sure you discuss any questions you have with your health care provider. Document Released: 08/23/2011 Document Revised: 04/19/2018 Document Reviewed: 05/25/2016 Elsevier Patient Education  2020 [employer].    Chronic Mesenteric Ischemia  Chronic mesenteric ischemia is poor blood flow (circulation) in the vessels that supply blood to the stomach, intestines, and liver (mesenteric organs). When the blood supply is severely restricted, these organs cannot work properly. This condition is also called mesenteric angina, or intestinal angina. This condition is a long-term (chronic) condition. It happens when an artery or vein that provides blood to the  mesenteric organs gradually becomes blocked or narrows over time, restricting the blood supply to these organs. What are the causes? This condition is commonly caused by fatty deposits that build up in an artery (plaque), which can narrow the artery and restrict blood flow. Other causes include:  Weakened areas in blood vessel walls (aneurysms).  Conditions that cause twisting or inflammation of blood vessels, such as fibromuscular dysplasia or arteritis.  A disorder in which blood clots form in the veins (venous thrombosis).  Scarring and thickening (fibrosis) of blood vessels caused by radiation therapy.  A tear in the aorta, the body's main artery (aortic dissection).  Blood vessel problems after illegal drug use, such as use of cocaine.  Tumors in the nervous system (neurofibromatosis).  Certain autoimmune diseases, such as lupus. What increases the risk? The following factors may make you more likely to develop this condition:  Being male.  Being over age 13, especially if you have a history of heart problems.  Smoking.  Having  congestive heart failure.  Having an irregular heartbeat (arrhythmia).  Having a history of heart attack or stroke.  Having diabetes.  Having high cholesterol.  Having high blood pressure (hypertension).  Being overweight or obese.  Having kidney disease (renal disease) that requires dialysis. What are the signs or symptoms? Symptoms of this condition include:  Pain or cramps in the abdomen that develop 15-60 minutes after a meal. This pain may last for 1-3 hours. Some people may develop a fear of eating because of this symptom.  Weight loss.  Diarrhea.  Bloody stool.  Nausea.  Vomiting.  Bloating.  Abdominal pain after stress or with exercise. How is this diagnosed? This condition is diagnosed based on:  Your medical history.  A physical exam.  Tests, such as: ? Ultrasound. ? CT scan. ? Blood tests. ? Urine  tests. ? An imaging test that involves injecting a dye into your arteries to show blood flow through blood vessels (angiogram). This can help to show if there are any blockages in the vessels that lead to the intestines. ? Passing a small probe through the mouth and into the stomach to measure the output of carbon dioxide (gastric tonometry). This can help to indicate whether there is decreased blood flow to the stomach and intestines. How is this treated? This condition may be treated with:  Dietary changes such as eating smaller, low-fat, meals more frequently.  Lifestyle changes to treat underlying conditions that contribute to the disease, such as high cholesterol and high blood pressure.  Medicines to reduce blood clotting and increase blood flow.  Surgery to remove the blockage, repair arteries or veins, and restore blood flow. This may involve: ? Angioplasty. This is surgery to widen the affected artery, reduce the blockage, and sometimes insert a small, mesh tube (stent). ? Bypass surgery. This may be done to go around (bypass) the blockage and reconnect healthy arteries or veins. ? Placing a stent in the affected area. This may be done to help keep blocked arteries open. Follow these instructions at home: Eating and drinking   Eat a heart-healthy diet. This includes fresh fruits and vegetables, whole grains, and lean proteins like chicken, fish, and beans.  Avoid foods that contain a lot of: ? Salt (sodium). ? Sugar. ? Saturated fat (such as red meat). ? Trans fat (such as in fried foods).  Stay hydrated. Drink enough fluid to keep your urine pale yellow. Lifestyle  Stay active and get regular exercise as told by your health care provider. Aim for 150 minutes of moderate activity or 75 minutes of vigorous activity a week. Ask your health care provider what activities and forms of exercise are safe for you.  Maintain a healthy weight.  Work with your health care provider to  manage your cholesterol.  Manage any other health problems you have, such as high blood pressure, diabetes, or heart rhythm problems.  Do not use any products that contain nicotine or tobacco, such as cigarettes, e-cigarettes, and chewing tobacco. If you need help quitting, ask your health care provider. General instructions  Take over-the-counter and prescription medicines only as told by your health care provider.  Keep all follow-up visits as told by your health care provider. This is important.  You may need to take actions to prevent or treat constipation, such as: ? Drink enough fluid to keep your urine pale yellow. ? Take over-the-counter or prescription medicines. ? Eat foods that are high in fiber, such as beans, whole grains, and fresh  fruits and vegetables. ? Limit foods that are high in fat and processed sugars, such as fried or sweet foods. Contact a health care provider if:  Your symptoms do not improve or they return after treatment.  You have a fever.  You are constipated. Get help right away if you:  Have severe abdominal pain.  Have severe chest pain.  Have shortness of breath.  Feel weak or dizzy.  Have fast or irregular heartbeats (palpitations).  Have numbness or weakness in your face, arm, or leg.  Are confused.  Have trouble speaking or people have trouble understanding what you are saying.  Have trouble urinating.  Have blood in your stool.  Have severe nausea, vomiting, or persistent diarrhea. These symptoms may represent a serious problem that is an emergency. Do not wait to see if the symptoms will go away. Get medical help right away. Call your local emergency services (911 in the [place] Do not drive yourself to the hospital. Summary  Mesenteric ischemia is poor circulation in the vessels that supply blood to the the stomach, intestines, and liver (mesenteric organs).  This condition happens when an artery or vein that provides blood to  the mesenteric organs gradually becomes blocked or narrow, restricting the blood supply to the organs.  This condition is commonly caused by fatty deposits that build up in an artery (plaque), which can narrow the artery and restrict blood flow.  You are more likely to develop this condition if you are over age 21 and have a history of heart problems, high blood pressure, diabetes, or high cholesterol.  This condition is usually treated with medicines, dietary and lifestyle changes, and surgery to remove the blockage, repair arteries or veins, and restore blood flow. This information is not intended to replace advice given to you by your health care provider. Make sure you discuss any questions you have with your health care provider. Document Released: 10/11/2010 Document Revised: 10/27/2017 Document Reviewed: 10/27/2017 Elsevier Patient Education  2020 [employer].     Peripheral Vascular Disease  Peripheral vascular disease (PVD) is a disease of the blood vessels that are not part of your heart and brain. A simple term for PVD is poor circulation. In most cases, PVD narrows the blood vessels that carry blood from your heart to the rest of your body. This can reduce the supply of blood to your arms, legs, and internal organs, like your stomach or kidneys. However, PVD most often affects a persons lower legs and feet. Without treatment, PVD tends to get worse. PVD can also lead to acute ischemic limb. This is when an arm or leg suddenly cannot get enough blood. This is a medical emergency. Follow these instructions at home: Lifestyle  Do not use any products that contain nicotine or tobacco, such as cigarettes and e-cigarettes. If you need help quitting, ask your doctor.  Lose weight if you are overweight. Or, stay at a healthy weight as told by your doctor.  Eat a diet that is low in fat and cholesterol. If you need help, ask your doctor.  Exercise regularly. Ask your doctor for  activities that are right for you. General instructions  Take over-the-counter and prescription medicines only as told by your doctor.  Take good care of your feet: ? Wear comfortable shoes that fit well. ? Check your feet often for any cuts or sores.  Keep all follow-up visits as told by your doctor This is important. Contact a doctor if:  You have cramps  in your legs when you walk.  You have leg pain when you are at rest.  You have coldness in a leg or foot.  Your skin changes.  You are unable to get or have an erection (erectile dysfunction).  You have cuts or sores on your feet that do not heal. Get help right away if:  Your arm or leg turns cold, numb, and blue.  Your arms or legs become red, warm, swollen, painful, or numb.  You have chest pain.  You have trouble breathing.  You suddenly have weakness in your face, arm, or leg.  You become very confused or you cannot speak.  You suddenly have a very bad headache.  You suddenly cannot see. Summary  Peripheral vascular disease (PVD) is a disease of the blood vessels.  A simple term for PVD is poor circulation. Without treatment, PVD tends to get worse.  Treatment may include exercise, low fat and low cholesterol diet, and quitting smoking. This information is not intended to replace advice given to you by your health care provider. Make sure you discuss any questions you have with your health care provider. Document Released: 05/18/2009 Document Revised: 02/03/2017 Document Reviewed: 03/31/2016 Elsevier Patient Education  2020 [employer].

## 2018-11-01 ENCOUNTER — Other Ambulatory Visit: Payer: Self-pay | Admitting: Vascular Surgery

## 2018-11-01 DIAGNOSIS — K551 Chronic vascular disorders of intestine: Secondary | ICD-10-CM

## 2018-11-02 ENCOUNTER — Ambulatory Visit: Payer: Medicare Other | Admitting: Gastroenterology

## 2018-11-02 ENCOUNTER — Encounter: Payer: Self-pay | Admitting: Gastroenterology

## 2018-11-02 ENCOUNTER — Other Ambulatory Visit: Payer: Self-pay

## 2018-11-02 VITALS — BP 124/66 | HR 86 | Temp 98.4°F | Ht 65.0 in | Wt 142.4 lb

## 2018-11-02 DIAGNOSIS — K222 Esophageal obstruction: Secondary | ICD-10-CM

## 2018-11-02 DIAGNOSIS — K219 Gastro-esophageal reflux disease without esophagitis: Secondary | ICD-10-CM | POA: Diagnosis not present

## 2018-11-02 DIAGNOSIS — K449 Diaphragmatic hernia without obstruction or gangrene: Secondary | ICD-10-CM

## 2018-11-02 DIAGNOSIS — K559 Vascular disorder of intestine, unspecified: Secondary | ICD-10-CM | POA: Diagnosis not present

## 2018-11-02 DIAGNOSIS — K5520 Angiodysplasia of colon without hemorrhage: Secondary | ICD-10-CM

## 2018-11-02 DIAGNOSIS — I739 Peripheral vascular disease, unspecified: Secondary | ICD-10-CM

## 2018-11-02 DIAGNOSIS — K2100 Gastro-esophageal reflux disease with esophagitis, without bleeding: Secondary | ICD-10-CM

## 2018-11-02 NOTE — Progress Notes (Addendum)
Chief Complaint: Gastritis, GERD, ischemic colitis, hospital follow-up, history of AVMs  GI history: 73 year old male with a history of CAD (CABG 1984 and 1996, stent placement 0086), diastolic heart failure, significant peripheral vascular disease on Eliquis, carotid endarterectomy 1996, history of atrial fibrillation, retinal artery stenosis, pleural effusions, GERD with esophagitis, history of esophageal ulcer, history of peptic ulcer, cecal AVMss (s/p APC), chronic constipation, and adenomatous colon polyps, admitted 8/5-10 with symptomatic anemia, Hgb 6.7. Normal ferritin, FOBT negative stools.  Transfused 3 units PRBCs.  Evaluated with EGD and colonoscopy as below.  Bilateral thoracentesis for pleural effusions. Discharge hemoglobin 8.2.  Prior to this admission, he has had a complex medical course this year, including hip surgery, pneumonia, and mesenteric ischemia  S/p Celiac and SMA stenitng in 07/2018 by Dr. Oneida Alar.  CTA demonstrated short segment origin occlusion  or high-grade stenosis of the IMA, patent distally reconstituted by visceral collaterals, so this was not intervened upon.    Subsequently hospitalized in 07/2018 with nausea, vomiting, dysphagia, with EGD n/f ulcerative esophagitis.  Treated with high-dose PPI and Carafate qid.     Endoscopic history: -Colonoscopy (10/2018, inpatient, Dr. Bryan Lemma): Ischemic colitis, suboptimal bowel prep -EGD (10/2018, Dr. Bryan Lemma, inpatient): Benign esophageal stricture, dilated with 18 mm TTS balloon without mucosal rent, 2 to 3 cm HH, gastritis, fixed, patent pyloric stenosis, normal duodenum -EGD in 07/2018 notable for erosive esophagitis, linear esophageal ulcer, and esophageal stricture. -Colonoscopy (2016): 3 tubular adenomas, cecal AVM-cauterized  HPI:    [name] is a 73 y.o. male presenting to the [facility] for routine hosptial follow-up.  Hospitalized earlier this month as outlined above.  Since  hospital discharge, denies any active GI symptoms, to include hematochezia, melena, nausea, vomiting.  Tolerating all p.o. intake without issues.  Taking all medications as prescribed, to include continued high-dose PPI and Carafate qid.  Was seen by his hematologist in [place], and he reports repeat Hgb was 8.7 earlier this week (not in EMR for review; made request today). Planning on Epo alpha treatment.  Has f/u with Dr. Oneida Alar in the next week or 2.  Past medical history, past surgical history, social history, family history, medications, and allergies reviewed in the chart and with patient.    Past Medical History:  Diagnosis Date  . Anemia   . Angina   . Atrial fibrillation ([place])   . Blood transfusion   . Bronchitis   . Bulging discs    "8 of them; thoracic, lumbar, sacral area"  . Chronic back pain   . Colon polyps   . Complication of anesthesia   . Coronary artery disease 01/24/11   Successful PCI long segmental stensois  vein graft to the CX marginal vessel-graft previously stented prox. 3.0x73mm TAXUS stent mid seg 3.0x17mm TAXUS stent now tandem stens of 3.0x42mm & 3.0x69mm placed with the seg. 50,80 & 99% stenosis reduced to 0%  . DDD (degenerative disc disease)   . Diastolic heart failure ([place])   . Dysrhythmia    "PAC's and PVC's"  . Esophageal dilatation    "have had it done 7 times; last time ~ 2001"  . Esophageal stricture   . GERD (gastroesophageal reflux disease)   . Headache(784.0)   . Hiatal hernia   . Hyperlipidemia   . Myocardial infarction ([place]) 1991  . Myocardial infarction [facility]) 2005   "had 3 heart attacks this year"  . Paroxysmal atrial fibrillation ([facility])   .  Pyloric stenosis   . RBBB   . Renal artery stenosis ([facility])    hhistory of right renal artery stenting and re-intervention for "in-stent restenosis.  . S/P CABG (coronary artery bypass graft) [place]  . Shortness of breath    "when I had the heart attacks"   . Subclavian artery stenosis, left ([place])   . Ulcer    "small; Dr. Henrene Pastor found it 02/2010"     Past Surgical History:  Procedure Laterality Date  . ADENOIDECTOMY     "when I was an infant"  . APPENDECTOMY  1953  . BALLOON DILATION N/A 10/14/2018   Procedure: BALLOON DILATION;  Surgeon: Lavena Bullion, DO;  Location: [place];  Service: Gastroenterology;  Laterality: N/A;  . BIOPSY  07/26/2018   Procedure: BIOPSY;  Surgeon: Thornton Park, MD;  Location: [place];  Service: Gastroenterology;;  . BIOPSY  10/14/2018   Procedure: BIOPSY;  Surgeon: Lavena Bullion, DO;  Location: [place];  Service: Gastroenterology;;  . CARDIOVERSION N/A 04/17/2017   Procedure: CARDIOVERSION;  Surgeon: Jerline Pain, MD;  Location[facility] ENDOSCOPY;  Service: Cardiovascular;  Laterality: N/A;  . CAROTID ENDARTERECTOMY Right 1996   CE  . COLONOSCOPY WITH PROPOFOL N/A 10/14/2018   Procedure: COLONOSCOPY WITH PROPOFOL;  Surgeon: Lavena Bullion, DO;  Location: [place];  Service: Gastroenterology;  Laterality: N/A;  . CORONARY ANGIOPLASTY WITH STENT PLACEMENT  01/24/11   "today makes a total of 9 stents"  . CORONARY ARTERY BYPASS GRAFT  1984   CABG X 4  . CORONARY ARTERY BYPASS GRAFT  1996   CABG X 4  . ESOPHAGOGASTRODUODENOSCOPY (EGD) WITH PROPOFOL N/A 07/26/2018   Procedure: ESOPHAGOGASTRODUODENOSCOPY (EGD) WITH PROPOFOL;  Surgeon: Thornton Park, MD;  Location: [place];  Service: Gastroenterology;  Laterality: N/A;  . ESOPHAGOGASTRODUODENOSCOPY (EGD) WITH PROPOFOL N/A 10/14/2018   Procedure: ESOPHAGOGASTRODUODENOSCOPY (EGD) WITH PROPOFOL;  Surgeon: Lavena Bullion, DO;  Location: [place];  Service: Gastroenterology;  Laterality: N/A;  . IR THORACENTESIS ASP PLEURAL SPACE W/IMG GUIDE  10/12/2018  . LEFT HEART CATHETERIZATION WITH CORONARY/GRAFT ANGIOGRAM N/A 01/24/2011   Procedure: LEFT HEART CATHETERIZATION WITH Beatrix Fetters;  Surgeon: Troy Sine, MD;   Location: [facility] CATH LAB;  Service: Cardiovascular;  Laterality: N/A;  . PERIPHERAL VASCULAR INTERVENTION  07/16/2018   Procedure: PERIPHERAL VASCULAR INTERVENTION;  Surgeon: Elam Dutch, MD;  Location: [place] CV LAB;  Service: Cardiovascular;;  SMA and Celiac  . pylondial cyst removal    . RETINAL DETACHMENT SURGERY  04/2007   right  . TONSILLECTOMY  1972  . VISCERAL ANGIOGRAPHY N/A 07/16/2018   Procedure: VISCERAL ANGIOGRAPHY;  Surgeon: Elam Dutch, MD;  Location: [place] CV LAB;  Service: Cardiovascular;  Laterality: N/A;   Family History  Problem Relation Age of Onset  . Breast cancer Paternal Aunt   . Diabetes Sister   . Ulcers Father   . Heart disease Father   . Heart failure Father   . Heart attack Father   . AAA (abdominal aortic aneurysm) Father   . Stroke Mother   . Hypertension Mother   . Colon cancer Neg Hx    Social History   Tobacco Use  . Smoking status: Former Smoker    Packs/day: 1.00    Years: 38.00    Pack years: 38.00    Types: Cigarettes    Quit date: 02/09/2005    Years since quitting: 13.7  . Smokeless tobacco: Never  Used  . Tobacco comment: "quit smoking cigarrettes 2006"  Substance Use Topics  . Alcohol use: No    Alcohol/week: 0.0 standard drinks  . Drug use: No   Current Outpatient Medications  Medication Sig Dispense Refill  . calcium carbonate (TUMS - DOSED IN MG ELEMENTAL CALCIUM) 500 MG chewable tablet Chew 1 tablet (200 mg of elemental calcium total) by mouth 3 (three) times daily as needed for indigestion or heartburn. 30 tablet 0  . diltiazem (CARDIZEM CD) 120 MG 24 hr capsule Take 1 capsule (120 mg total) by mouth daily. 30 capsule 6  . diltiazem (CARDIZEM) 30 MG tablet Take 1 tablet (30 mg total) by mouth as needed (for heart rate greater than 120). 120 tablet 0  . dutasteride (AVODART) 0.5 MG capsule Take 0.5 mg by mouth daily.     Marland Kitchen ELIQUIS 5 MG TABS tablet TAKE 1 TABLET BY MOUTH TWICE A DAY (Patient taking differently:  Take 5 mg by mouth 2 (two) times daily. ) 180 tablet 3  . esomeprazole (NEXIUM) 40 MG capsule Take 1 capsule (40 mg total) by mouth 2 (two) times daily before a meal. 120 capsule 3  . ezetimibe (ZETIA) 10 MG tablet TAKE 1 TABLET (10 MG TOTAL) BY MOUTH DAILY. (Patient taking differently: Take 10 mg by mouth daily. ) 90 tablet 3  . finasteride (PROSCAR) 5 MG tablet Take 5 mg by mouth daily.    . Gabapentin Enacarbil (HORIZANT) 600 MG TBCR Take by mouth at bedtime.    Marland Kitchen Ketotifen Fumarate (ALAWAY OP) Place 1 drop into the right eye daily. Uses before putting contact Lens in R eye    . loratadine (CLARITIN) 10 MG tablet Take 10 mg by mouth daily as needed for allergies, rhinitis or itching.   3  . lovastatin (MEVACOR) 40 MG tablet Take 20 mg by mouth at bedtime.    . metolazone (ZAROXOLYN) 2.5 MG tablet Take 2.5 mg by mouth 2 (two) times a week. Monday, Friday    . Multiple Vitamins-Minerals (MULTIVITAMINS THER. W/MINERALS) TABS Take 1 tablet by mouth daily.      . nitroGLYCERIN (NITROLINGUAL) 0.4 MG/SPRAY spray Place 1 spray under the tongue every 5 (five) minutes x 3 doses as needed for chest pain. 4.9 g 2  . ranolazine (RANEXA) 1000 MG SR tablet Take 500 mg by mouth 2 (two) times daily.    Marland Kitchen senna-docusate (SENOKOT-S) 8.6-50 MG tablet Take 1 tablet by mouth daily. 30 tablet 6  . torsemide (DEMADEX) 20 MG tablet Take 1 tablet (20 mg total) by mouth 2 (two) times daily. FOR SWELLING OR WEIGHT GAIN 180 tablet 3  . vitamin E 400 UNIT capsule Take 400 Units by mouth daily.     . sucralfate (CARAFATE) 1 GM/10ML suspension Take 10 mLs (1 g total) by mouth 4 (four) times daily -  with meals and at bedtime. 1200 mL 11   No current facility-administered medications for this visit.    Allergies  Allergen Reactions  . Codeine Nausea And Vomiting  . Sulfa Antibiotics Rash  . Sulfonamide Derivatives Rash     Review of Systems: All systems reviewed and negative except where noted in HPI.     Physical  Exam:    Complete physical exam not completed due to the nature of this telehealth communication.   Gen: Awake, alert, and oriented, and well communicative.  Sitting in wheelchair HEENT: EOMI, non-icteric sclera, NCAT, MMM Neck: Normal movement of head and neck CV: SEM, regular rate, mild  L>R LE edema Pulm: No labored breathing, speaking in full sentences without conversational dyspnea, CTA GI: Soft, nontender, nondistended Psych: Pleasant, cooperative, normal speech, thought processing seemingly intact   ASSESSMENT AND PLAN;   1) Ischemic colitis 2) Gastritis 3) GERD with Erosive Esophagitis 4) Peripheral Vascular Disease 5) anemia 6) History of colonic AVMs 7) Esophageal stricture  -Resume high-dose PPI and Carafate for both GERD with EE and gastritis - Ischemic colitis in the sigmoid colon on recent colonoscopy.  No active symptoms now.  Does have history of IMA disease.  Follows up with Dr. Oneida Alar in the next week or so -Tolerating well p.o. intake, without any dysphagia -No plan for repeat endoscopic evaluation at this time.  Can repeat EGD/colonoscopy on an as-needed basis - Will obtain records from Hematology for review of recent blood work -Per patient and his wife, planning to start Retacrit with Hematology -Discussed his multiple GI issues with relation to his overall global health.  Undoubtedly has impaired healing and recovery times due in part to his significant cardiovascular disease  RTC in 2 to 3 months or sooner as needed  I spent a total of 25 minutes of face-to-face time with the patient. Greater than 50% of the time was spent counseling and coordinating care.     Lavena Bullion, DO, FACG  11/02/2018, 11:06 AM   Garwin Brothers, MD   Labs received from [facility] Hematology and notable for the following: - WBC 7, H/H8 0.7/25.9, PLT 221, MCV/RDW 88/27 - NA 133, K3.3, BUN/creatinine 27/1.4 - Normal B12, folate, iron 55, TIBC 334, sat 16.4%, ferritin 92

## 2018-11-02 NOTE — Patient Instructions (Signed)
Normal BMI (Body Mass Index- based on height and weight) is between 23 and 30. Your BMI today is Body mass index is 23.69 kg/m. Marland Kitchen Please consider follow up  regarding your BMI with your Primary Care Provider.   Thank you for entrusting me with your care and choosing [facility].  Dr Bryan Lemma

## 2018-11-11 ENCOUNTER — Other Ambulatory Visit: Payer: Self-pay | Admitting: Cardiovascular Disease

## 2018-11-26 ENCOUNTER — Ambulatory Visit
Admission: RE | Admit: 2018-11-26 | Discharge: 2018-11-26 | Disposition: A | Discharge status: Home / Self Care | Payer: Medicare Other | Source: Ambulatory Visit | Attending: Vascular Surgery | Admitting: Vascular Surgery

## 2018-11-26 ENCOUNTER — Other Ambulatory Visit: Payer: Self-pay

## 2018-11-26 DIAGNOSIS — K551 Chronic vascular disorders of intestine: Secondary | ICD-10-CM

## 2018-11-26 MED ORDER — IOPAMIDOL (ISOVUE-370) INJECTION 76%
75.0000 mL | Freq: Once | INTRAVENOUS | Status: AC | PRN
  Administered 2018-11-26: 75 mL via INTRAVENOUS

## 2018-11-27 DIAGNOSIS — I130 Hypertensive heart and chronic kidney disease with heart failure and stage 1 through stage 4 chronic kidney disease, or unspecified chronic kidney disease: Secondary | ICD-10-CM | POA: Diagnosis not present

## 2018-11-27 DIAGNOSIS — N183 Chronic kidney disease, stage 3 (moderate): Secondary | ICD-10-CM

## 2018-11-27 DIAGNOSIS — N179 Acute kidney failure, unspecified: Secondary | ICD-10-CM

## 2018-11-27 DIAGNOSIS — D62 Acute posthemorrhagic anemia: Secondary | ICD-10-CM

## 2018-11-27 DIAGNOSIS — I480 Paroxysmal atrial fibrillation: Secondary | ICD-10-CM

## 2018-11-27 DIAGNOSIS — I350 Nonrheumatic aortic (valve) stenosis: Secondary | ICD-10-CM | POA: Diagnosis not present

## 2018-11-27 DIAGNOSIS — I110 Hypertensive heart disease with heart failure: Secondary | ICD-10-CM | POA: Diagnosis not present

## 2018-11-27 DIAGNOSIS — I5043 Acute on chronic combined systolic (congestive) and diastolic (congestive) heart failure: Secondary | ICD-10-CM | POA: Diagnosis not present

## 2018-11-27 DIAGNOSIS — I361 Nonrheumatic tricuspid (valve) insufficiency: Secondary | ICD-10-CM | POA: Diagnosis not present

## 2018-11-27 DIAGNOSIS — I340 Nonrheumatic mitral (valve) insufficiency: Secondary | ICD-10-CM | POA: Diagnosis not present

## 2018-11-28 DIAGNOSIS — I130 Hypertensive heart and chronic kidney disease with heart failure and stage 1 through stage 4 chronic kidney disease, or unspecified chronic kidney disease: Secondary | ICD-10-CM | POA: Diagnosis not present

## 2018-11-28 DIAGNOSIS — I4891 Unspecified atrial fibrillation: Secondary | ICD-10-CM | POA: Diagnosis not present

## 2018-11-28 DIAGNOSIS — I509 Heart failure, unspecified: Secondary | ICD-10-CM | POA: Diagnosis not present

## 2018-11-28 DIAGNOSIS — I2510 Atherosclerotic heart disease of native coronary artery without angina pectoris: Secondary | ICD-10-CM

## 2018-11-28 DIAGNOSIS — I5043 Acute on chronic combined systolic (congestive) and diastolic (congestive) heart failure: Secondary | ICD-10-CM | POA: Diagnosis not present

## 2018-11-28 DIAGNOSIS — I110 Hypertensive heart disease with heart failure: Secondary | ICD-10-CM | POA: Diagnosis not present

## 2018-11-28 DIAGNOSIS — J90 Pleural effusion, not elsewhere classified: Secondary | ICD-10-CM | POA: Diagnosis not present

## 2018-11-28 DIAGNOSIS — I350 Nonrheumatic aortic (valve) stenosis: Secondary | ICD-10-CM | POA: Diagnosis not present

## 2018-11-28 DIAGNOSIS — N179 Acute kidney failure, unspecified: Secondary | ICD-10-CM | POA: Diagnosis not present

## 2018-11-29 ENCOUNTER — Ambulatory Visit: Payer: Medicare Other | Admitting: Vascular Surgery

## 2018-11-29 DIAGNOSIS — J90 Pleural effusion, not elsewhere classified: Secondary | ICD-10-CM | POA: Diagnosis not present

## 2018-11-29 DIAGNOSIS — I350 Nonrheumatic aortic (valve) stenosis: Secondary | ICD-10-CM | POA: Diagnosis not present

## 2018-11-29 DIAGNOSIS — I509 Heart failure, unspecified: Secondary | ICD-10-CM | POA: Diagnosis not present

## 2018-11-29 DIAGNOSIS — N179 Acute kidney failure, unspecified: Secondary | ICD-10-CM | POA: Diagnosis not present

## 2018-11-29 DIAGNOSIS — I4891 Unspecified atrial fibrillation: Secondary | ICD-10-CM | POA: Diagnosis not present

## 2018-11-29 DIAGNOSIS — I110 Hypertensive heart disease with heart failure: Secondary | ICD-10-CM | POA: Diagnosis not present

## 2018-11-29 DIAGNOSIS — I130 Hypertensive heart and chronic kidney disease with heart failure and stage 1 through stage 4 chronic kidney disease, or unspecified chronic kidney disease: Secondary | ICD-10-CM | POA: Diagnosis not present

## 2018-11-29 DIAGNOSIS — I5043 Acute on chronic combined systolic (congestive) and diastolic (congestive) heart failure: Secondary | ICD-10-CM | POA: Diagnosis not present

## 2018-11-30 DIAGNOSIS — I130 Hypertensive heart and chronic kidney disease with heart failure and stage 1 through stage 4 chronic kidney disease, or unspecified chronic kidney disease: Secondary | ICD-10-CM | POA: Diagnosis not present

## 2018-11-30 DIAGNOSIS — I4891 Unspecified atrial fibrillation: Secondary | ICD-10-CM | POA: Diagnosis not present

## 2018-11-30 DIAGNOSIS — N179 Acute kidney failure, unspecified: Secondary | ICD-10-CM | POA: Diagnosis not present

## 2018-11-30 DIAGNOSIS — I509 Heart failure, unspecified: Secondary | ICD-10-CM | POA: Diagnosis not present

## 2018-11-30 DIAGNOSIS — I5043 Acute on chronic combined systolic (congestive) and diastolic (congestive) heart failure: Secondary | ICD-10-CM | POA: Diagnosis not present

## 2018-11-30 DIAGNOSIS — J90 Pleural effusion, not elsewhere classified: Secondary | ICD-10-CM | POA: Diagnosis not present

## 2018-11-30 DIAGNOSIS — I350 Nonrheumatic aortic (valve) stenosis: Secondary | ICD-10-CM | POA: Diagnosis not present

## 2018-11-30 DIAGNOSIS — I110 Hypertensive heart disease with heart failure: Secondary | ICD-10-CM | POA: Diagnosis not present

## 2018-12-05 ENCOUNTER — Telehealth: Payer: Self-pay | Admitting: Cardiovascular Disease

## 2018-12-05 NOTE — Telephone Encounter (Signed)
LVM for patient regarding followup on 10-2 with Dr. Claiborne Billings for a virtual visit for his 6 month followup.

## 2018-12-07 ENCOUNTER — Other Ambulatory Visit: Payer: Self-pay

## 2018-12-07 ENCOUNTER — Encounter: Payer: Self-pay | Admitting: Physician Assistant

## 2018-12-07 ENCOUNTER — Ambulatory Visit: Payer: Medicare Other | Admitting: Physician Assistant

## 2018-12-07 VITALS — BP 124/65 | HR 88 | Temp 98.3°F | Ht 65.0 in | Wt 146.0 lb

## 2018-12-07 DIAGNOSIS — I739 Peripheral vascular disease, unspecified: Secondary | ICD-10-CM

## 2018-12-07 DIAGNOSIS — I5021 Acute systolic (congestive) heart failure: Secondary | ICD-10-CM | POA: Diagnosis not present

## 2018-12-07 DIAGNOSIS — I25810 Atherosclerosis of coronary artery bypass graft(s) without angina pectoris: Secondary | ICD-10-CM

## 2018-12-07 DIAGNOSIS — I10 Essential (primary) hypertension: Secondary | ICD-10-CM

## 2018-12-07 DIAGNOSIS — N289 Disorder of kidney and ureter, unspecified: Secondary | ICD-10-CM

## 2018-12-07 DIAGNOSIS — E785 Hyperlipidemia, unspecified: Secondary | ICD-10-CM

## 2018-12-07 DIAGNOSIS — R600 Localized edema: Secondary | ICD-10-CM | POA: Diagnosis not present

## 2018-12-07 DIAGNOSIS — N189 Chronic kidney disease, unspecified: Secondary | ICD-10-CM

## 2018-12-07 DIAGNOSIS — I4819 Other persistent atrial fibrillation: Secondary | ICD-10-CM

## 2018-12-07 DIAGNOSIS — D649 Anemia, unspecified: Secondary | ICD-10-CM

## 2018-12-07 DIAGNOSIS — I6523 Occlusion and stenosis of bilateral carotid arteries: Secondary | ICD-10-CM

## 2018-12-07 NOTE — Patient Instructions (Signed)
Medication Instructions:  Your physician recommends that you continue on your current medications as directed. Please refer to the Current Medication list given to you today. If you need a refill on your cardiac medications before your next appointment, please call your pharmacy.   Lab work: Your physician recommends that you return for lab work in: TODAY-CMET If you have labs (blood work) drawn today and your tests are completely normal, you will receive your results only by: Marland Kitchen MyChart Message (if you have MyChart) OR . A paper copy in the mail If you have any lab test that is abnormal or we need to change your treatment, we will call you to review the results.  Testing/Procedures: NONE   Follow-Up: At [facility], you and your health needs are our priority.  As part of our continuing mission to provide you with exceptional heart care, we have created designated Provider Care Teams.  These Care Teams include your primary Cardiologist (physician) and Advanced Practice Providers (APPs -  Physician Assistants and Nurse Practitioners) who all work together to provide you with the care you need, when you need it.  . Your physician recommends that you schedule a follow-up appointment in: 1-2 WEEKS WITH HAO MENG, PA-C  Any Other Special Instructions Will Be Listed Below (If Applicable).

## 2018-12-07 NOTE — Progress Notes (Signed)
Cardiology Office Note    Date:  12/08/2018   ID:  [name], [name] 06/29/1945, MRN [id number]  PCP:  Garwin Brothers, MD  Cardiologist:  Shelva Majestic, MD  Electrophysiologist:  Constance Haw, MD   Chief Complaint  Patient presents with   Follow-up    seen for Dr. Claiborne Billings.     History of Present Illness:  [name] is a 73 y.o. male with PMH of CAD s/p CABG in [place] and redo CABG in 1996, PAD with bilateral SFA occlusion, carotid artery disease s/p right CEA 2005, right bundle branch block, renal artery stenosis s/p right renal artery stenting and repeat intervention, subclavian artery stenosis, CKD stage II-III, hypertension, hyperlipidemia, and persistent atrial fibrillation.  In 1998, he underwent stenting of his left main artery and stenting of SVG to OM.  He had stenting of SVG to marginal vessel in 2012.  Myoview in May 2015 showed mild inferobasal ischemia likely in the RCA distribution.  He was started on Eliquis in May 2018 after discontinuing the Plavix.  Repeat echocardiogram in June 2018 showed EF 55 to 60%, PA peak pressure elevated at 48 mmHg, moderate LVH.  Due to recurrent atrial fibrillation, he was referred to Dr. Curt Bears in January 2019.  Due to QTC prolongation, he was not felt to be a candidate for Tikosyn or sotalol and also felt to be high risk for ablation.  As result, he was recommended to reinitiate amiodarone therapy in February 2019 and he subsequently underwent successful cardioversion.  Most recent carotid Doppler obtained in February 2020 showed 40 to 59% bilateral ICA disease, right subclavian artery was stenotic, well left subclavian artery flow was disturbed.  He was admitted in May 2020 with progressive abdominal pain and generalized weakness.  D-dimer was elevated and troponin was elevated as well.  Cardiology was consulted and felt the troponin elevation was due to demand ischemia as he specifically denied any recent chest pain.  Troponin  eventually peaked at 5.16 before trending back down.  Work-up did not demonstrate any PE however did notice SMA stenosis.  Vascular surgery was consulted for mesenteric ischemia.  He eventually underwent selective celiac and superior mesenteric angiogram and stenting of the celiac artery and the superior mesenteric artery by Dr. Oneida Alar.  He returned to the hospital on 5/18 due to significant heartburn, nausea, vomiting and dysphasia.  He was evaluated by GI with endoscopy on 5/20 revealing nonbleeding esophageal ulcer, benign-appearing esophageal stenosis, gastric stenosis at the pylorus.  Esophageal stricture was not dilated due to presence of ulcer per GI.  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  Vascular surgery was consulted and treated pseudoaneurysm with compression x2.  Follow-up duplex showed aneurysm was successfully closed.  Eliquis restarted on 5/23.  I last saw the patient as a virtual visit on 08/02/2018, he was doing well at the time.  I switched his short acting diltiazem to long-acting diltiazem.  His torsemide was doubled in June 2020 for lower extremity edema.  Patient was readmitted in August 2020 with generalized weakness and exercise intolerance.  He was noted to be severely anemic with hemoglobin down to 6.7.  He was transfused 2 units of packed red blood cell.  He also had acute kidney injury as well.  He was discharged on Protonix 40 mg twice daily and Carafate.  GI felt her anemia was not from the GI tract and I recommended hematology consult.  He was restarted  on Eliquis.  During the hospitalization, he was noted to have significant pleural effusion and underwent left and right thoracentesis. (1.7 L removed from right side, 650 cc removed from left side)  Patient presents today for follow-up along with his wife, apparently a lot of things has happened since he was last admitted.  He was readmitted to [facility] over a week ago for volume  overload and had a 2 L removed from thoracentesis on the right side.  Echocardiogram at the [facility] also suggested EF has went down to 30 to 35% with possible aortic valve disease.  I do not have the record, will request.  He is Eliquis was discontinued during the last hospitalization.  I do agree at this time that his bleeding risk is too high and ideally he should be on a low-dose aspirin if it is okay by his hematologist.  On physical exam he has at least 2+ pitting edema bilaterally, I think he is significantly volume overloaded.  I want to give him a dose of metolazone, however he has a history of acute kidney injury.  I will obtain a basic metabolic panel.  If renal function is stable, I plan to give him a dose of 2.5 mg metolazone tomorrow.  He continued to have anemia issue and recent intervention has not shown significant improvement according to his wife.  Unfortunately, information that was provided in our epic system is quite limited at this time, we plan to request the record and make a final decision in the future.  I want to see the patient back in 1 to 2 weeks for earlier reassessment as if he has very high risk for readmission.  Note, he says he is torsemide was increased to 40 mg twice daily last week.   Past Medical History:  Diagnosis Date   Anemia    Angina    Atrial fibrillation ([place])    Blood transfusion    Bronchitis    Bulging discs    "8 of them; thoracic, lumbar, sacral area"   Chronic back pain    Colon polyps    Complication of anesthesia    Coronary artery disease 01/24/11   Successful PCI long segmental stensois  vein graft to the CX marginal vessel-graft previously stented prox. 3.0x22mm TAXUS stent mid seg 3.0x88mm TAXUS stent now tandem stens of 3.0x46mm & 3.0x76mm placed with the seg. 50,80 & 99% stenosis reduced to 0%   DDD (degenerative disc disease)    Diastolic heart failure ([place])    Dysrhythmia    "PAC's and PVC's"   Esophageal  dilatation    "have had it done 7 times; last time ~ 2001"   Esophageal stricture    GERD (gastroesophageal reflux disease)    Headache(784.0)    Hiatal hernia    Hyperlipidemia    Myocardial infarction [facility]) 1991   Myocardial infarction ([place]) 2005   "had 3 heart attacks this year"   Paroxysmal atrial fibrillation ([place])    Pyloric stenosis    RBBB    Renal artery stenosis (H[facility])    hhistory of right renal artery stenting and re-intervention for "in-stent restenosis.   S/P CABG (coronary artery bypass graft) [place] of breath    "when I had the heart attacks"   Subclavian artery stenosis, left ([place])    Ulcer    "small; Dr. Henrene Pastor found it 02/2010"    Past Surgical History:  Procedure Laterality  Date   ADENOIDECTOMY     "when I was an infant"   [place] N/A 10/14/2018   Procedure: BALLOON DILATION;  Surgeon: Lavena Bullion, DO;  Location: [place];  Service: Gastroenterology;  Laterality: N/A;   BIOPSY  07/26/2018   Procedure: BIOPSY;  Surgeon: Thornton Park, MD;  Location: [place];  Service: Gastroenterology;;   BIOPSY  10/14/2018   Procedure: BIOPSY;  Surgeon: Lavena Bullion, DO;  Location: [place];  Service: Gastroenterology;;   CARDIOVERSION N/A 04/17/2017   Procedure: CARDIOVERSION;  Surgeon: Jerline Pain, MD;  Location: [place];  Service: Cardiovascular;  Laterality: N/A;   CAROTID ENDARTERECTOMY Right 1996   CE   COLONOSCOPY WITH PROPOFOL N/A 10/14/2018   Procedure: COLONOSCOPY WITH PROPOFOL;  Surgeon: Lavena Bullion, DO;  Location: [place];  Service: Gastroenterology;  Laterality: N/A;   CORONARY ANGIOPLASTY WITH STENT PLACEMENT  01/24/11   "today makes a total of 9 stents"   CORONARY ARTERY BYPASS GRAFT  1984   CABG X 4   CORONARY ARTERY BYPASS GRAFT  1996   CABG X 4   ESOPHAGOGASTRODUODENOSCOPY (EGD) WITH PROPOFOL N/A  07/26/2018   Procedure: ESOPHAGOGASTRODUODENOSCOPY (EGD) WITH PROPOFOL;  Surgeon: Thornton Park, MD;  Location: [place];  Service: Gastroenterology;  Laterality: N/A;   ESOPHAGOGASTRODUODENOSCOPY (EGD) WITH PROPOFOL N/A 10/14/2018   Procedure: ESOPHAGOGASTRODUODENOSCOPY (EGD) WITH PROPOFOL;  Surgeon: Lavena Bullion, DO;  Location: [place];  Service: Gastroenterology;  Laterality: N/A;   IR THORACENTESIS ASP PLEURAL SPACE W/IMG GUIDE  10/12/2018   LEFT HEART CATHETERIZATION WITH CORONARY/GRAFT ANGIOGRAM N/A 01/24/2011   Procedure: LEFT HEART CATHETERIZATION WITH Beatrix Fetters;  Surgeon: Troy Sine, MD;  Location: [facility] CATH LAB;  Service: Cardiovascular;  Laterality: N/A;   PERIPHERAL VASCULAR INTERVENTION  07/16/2018   Procedure: PERIPHERAL VASCULAR INTERVENTION;  Surgeon: Elam Dutch, MD;  Location: [place] CV LAB;  Service: Cardiovascular;;  SMA and Celiac   pylondial cyst removal     RETINAL DETACHMENT SURGERY  04/2007   right   TONSILLECTOMY  1972   VISCERAL ANGIOGRAPHY N/A 07/16/2018   Procedure: VISCERAL ANGIOGRAPHY;  Surgeon: Elam Dutch, MD;  Location: [place] CV LAB;  Service: Cardiovascular;  Laterality: N/A;    Current Medications: Outpatient Medications Prior to Visit  Medication Sig Dispense Refill   butalbital-aspirin-caffeine (FIORINAL) 50-325-40 MG capsule Take 1 capsule by mouth daily.     calcium carbonate (TUMS - DOSED IN MG ELEMENTAL CALCIUM) 500 MG chewable tablet Chew 1 tablet (200 mg of elemental calcium total) by mouth 3 (three) times daily as needed for indigestion or heartburn. 30 tablet 0   diltiazem (CARDIZEM CD) 120 MG 24 hr capsule Take 1 capsule (120 mg total) by mouth daily. 30 capsule 6   dutasteride (AVODART) 0.5 MG capsule Take 0.5 mg by mouth daily.      esomeprazole (NEXIUM) 40 MG capsule Take 1 capsule (40 mg total) by mouth 2 (two) times daily before a meal. 120 capsule 3   ezetimibe (ZETIA) 10 MG  tablet TAKE 1 TABLET (10 MG TOTAL) BY MOUTH DAILY. (Patient taking differently: Take 10 mg by mouth daily. ) 90 tablet 3   finasteride (PROSCAR) 5 MG tablet Take 5 mg by mouth daily.     Gabapentin Enacarbil (HORIZANT) 600 MG TBCR Take by mouth at bedtime.     Ketotifen Fumarate (ALAWAY OP) Place 1 drop into the right eye daily. Uses before putting contact Lens in R  eye     loratadine (CLARITIN) 10 MG tablet Take 10 mg by mouth daily as needed for allergies, rhinitis or itching.   3   lovastatin (MEVACOR) 40 MG tablet TAKE 1 TABLET BY MOUTH AT BEDTIME 90 tablet 3   metolazone (ZAROXOLYN) 2.5 MG tablet Take 2.5 mg by mouth 2 (two) times a week. Monday, Friday     Multiple Vitamins-Minerals (MULTIVITAMINS THER. W/MINERALS) TABS Take 1 tablet by mouth daily.       nitroGLYCERIN (NITROLINGUAL) 0.4 MG/SPRAY spray Place 1 spray under the tongue every 5 (five) minutes x 3 doses as needed for chest pain. 4.9 g 2   Potassium Chloride ER 20 MEQ TBCR Take 1 tablet by mouth daily.     ranolazine (RANEXA) 1000 MG SR tablet Take 500 mg by mouth 2 (two) times daily.     senna-docusate (SENOKOT-S) 8.6-50 MG tablet Take 1 tablet by mouth daily. 30 tablet 6   sucralfate (CARAFATE) 1 GM/10ML suspension Take 10 mLs (1 g total) by mouth 4 (four) times daily -  with meals and at bedtime. 1200 mL 11   tamsulosin (FLOMAX) 0.4 MG CAPS capsule Take 0.4 mg by mouth daily.     torsemide (DEMADEX) 20 MG tablet Take 1 tablet (20 mg total) by mouth 2 (two) times daily. FOR SWELLING OR WEIGHT GAIN 180 tablet 3   vitamin E 400 UNIT capsule Take 400 Units by mouth daily.      diltiazem (CARDIZEM) 30 MG tablet Take 1 tablet (30 mg total) by mouth as needed (for heart rate greater than 120). (Patient not taking: Reported on 12/07/2018) 120 tablet 0   ELIQUIS 5 MG TABS tablet TAKE 1 TABLET BY MOUTH TWICE A DAY (Patient not taking: No sig reported) 180 tablet 3   No facility-administered medications prior to visit.       Allergies:   Codeine, Sulfa antibiotics, and Sulfonamide derivatives   Social History   Socioeconomic History   Marital status: Married    Spouse name: Not on file   Number of children: Not on file   Years of education: Not on file   Highest education level: Not on file  Occupational History   Not on file  Social Needs   Financial resource strain: Not hard at all   Food insecurity    Worry: Never true    Inability: Never true   Transportation needs    Medical: No    Non-medical: No  Tobacco Use   Smoking status: Former Smoker    Packs/day: 1.00    Years: 38.00    Pack years: 38.00    Types: Cigarettes    Quit date: 02/09/2005    Years since quitting: 13.8   Smokeless tobacco: Never Used   Tobacco comment: "quit smoking cigarrettes 2006"  Substance and Sexual Activity   Alcohol use: No    Alcohol/week: 0.0 standard drinks   Drug use: No   Sexual activity: Not on file  Lifestyle   Physical activity    Days per week: 0 days    Minutes per session: 0 min   Stress: Only a little  Relationships   Social connections    Talks on phone: Not on file    Gets together: Not on file    Attends religious service: Not on file    Active member of club or organization: No    Attends meetings of clubs or organizations: Never    Relationship status: Married  Other Topics Concern  Not on file  Social History Narrative   Not on file     Family History:  The patient's family history includes AAA (abdominal aortic aneurysm) in his father; Breast cancer in his paternal aunt; Diabetes in his sister; Heart attack in his father; Heart disease in his father; Heart failure in his father; Hypertension in his mother; Stroke in his mother; Ulcers in his father.   ROS:   Please see the history of present illness.    ROS All other systems reviewed and are negative.   PHYSICAL EXAM:   VS:  BP 124/65    Pulse 88    Temp 98.3 F (36.8 C)    Ht 5\' 5"  (1.651 m)    Wt 146  lb (66.2 kg)    SpO2 96%    BMI 24.30 kg/m    GEN: Chronically ill-appearing, sitting in wheelchair HEENT: normal  Neck: no JVD, carotid bruits, or masses Cardiac: RRR; no murmurs, rubs, or gallops,no edema  Respiratory:  clear to auscultation bilaterally, normal work of breathing GI: soft, nontender, nondistended, + BS MS: no deformity or atrophy  Skin: warm and dry, no rash Neuro:  Alert and Oriented x 3, Strength and sensation are intact Psych: euthymic mood, full affect  Wt Readings from Last 3 Encounters:  12/07/18 146 lb (66.2 kg)  11/02/18 142 lb 6 oz (64.6 kg)  10/15/18 140 lb 12.8 oz (63.9 kg)      Studies/Labs Reviewed:   EKG:  EKG is not ordered today.    Recent Labs: 07/23/2018: B Natriuretic Peptide 1,122.4 07/24/2018: TSH 0.857 10/10/2018: Magnesium 2.1 10/13/2018: Platelets 130 10/15/2018: Hemoglobin 8.2 12/07/2018: ALT 17; BUN 24; Creatinine, Ser 1.82; Potassium 4.6; Sodium 134   Lipid Panel    Component Value Date/Time   CHOL 143 05/25/2018 1027   TRIG 84 05/25/2018 1027   HDL 63 05/25/2018 1027   CHOLHDL 2.3 05/25/2018 1027   CHOLHDL 2.6 08/01/2014 0852   VLDL 18 08/01/2014 0852   LDLCALC 63 05/25/2018 1027    Additional studies/ records that were reviewed today include:   Echo 08/27/2018 IMPRESSIONS    1. The left ventricle has low normal systolic function, with an ejection fraction of 50-55%. The cavity size was normal. There is mild asymmetric left ventricular hypertrophy. Left ventricular diastolic Doppler parameters are indeterminate.  2. Septal and inferior basal hypokinesis.  3. The right ventricle has normal systolic function. The cavity was normal. There is no increase in right ventricular wall thickness.  4. Left atrial size was moderately dilated.  5. The mitral valve is degenerative. Moderate thickening of the mitral valve leaflet. Moderate calcification of the mitral valve leaflet. There is moderate mitral annular calcification present.  The MR jet is anteriorly-directed.  6. The aortic valve is tricuspid. Severely thickening of the aortic valve. Severe calcifcation of the aortic valve. Mild stenosis of the aortic valve.    ASSESSMENT:    1. Acute systolic heart failure ([place])   2. Acute on chronic renal insufficiency   3. Coronary artery disease involving coronary bypass graft of native heart without angina pectoris   4. Leg edema   5. PAD (peripheral artery disease) ([place])   6. Bilateral carotid artery stenosis   7. Essential hypertension   8. Hyperlipidemia with target LDL less than 70   9. Persistent atrial fibrillation ([place])   10. Anemia, unspecified type      PLAN:  In order of problems listed above:  1. Acute systolic heart failure: This  is by the patient's report that her recent echocardiogram at [facility] demonstrated EF has went down to 30 to 35% when compared to the previous echocardiogram earlier this year.  On physical exam, he has at least 2+ pitting edema in bilateral lower extremity.  There is some confusion regarding his current renal function.  Last creatinine was 1.33 on August 10, however based on [facility] result, his creatinine was 2.1 around August 20.  I want to obtain a CMP and reassess his renal function and the albumin level before proceeding with metolazone.  Note, according to the patient, his diuretic has been switched to torsemide 40 mg twice daily a week ago at [facility].  Since then, he continued to accumulate lower extremity edema.  He is at very high risk for readmission.  If renal function is stable, I likely will dose him with a weekly regiment of metolazone.  I want to see this patient back in 1 to 2 weeks given high risk of readmission and the likelihood of continued deterioration.  2. Acute on chronic renal insufficiency: Obtain basic metabolic panel  3. CAD s/p CABG: He denies any recent chest discomfort, however even though echocardiogram obtained in June 2020 demonstrated  normal EF, however recent echocardiogram performed at [facility] reportedly demonstrated LV dysfunction with EF of 35%. 4. Aortic valve disease: He is aortic regurgitation was unable to be visualized very well on previous echocardiogram in June, however multiple provider including both the hospitalist at [facility] and also his PCP has mentioned potential need for aortic valve repair.  I wonder if there was a significant finding on the echocardiogram he had at [facility] recently.  I will try to request the record  5. Anemia: This has been a recurrent issue.  He has been seen by GI service who felt the anemia may not completely related to blood loss, therefore he was referred to a hematologist.  According to his wife, his anemia has not improved significantly.  I will request the records from his hematologist  6. PAD: History of bilateral lower extremity arterial disease.  He also had mesenteric artery stent as well  7. Persistent atrial fibrillation: He is at very high risk for bleeding at this time, his Eliquis was discontinued a week ago at [facility].  I am agreeable with this decision as I think his bleeding risk outweighs the potential stroke risk.  He is aware that he is at higher likelihood of for stroke given chronic atrial fibrillation.  8. Hyperlipidemia: Continue lovastatin  9. Hypertension: Blood pressure stable on current therapy    Medication Adjustments/Labs and Tests Ordered: Current medicines are reviewed at length with the patient today.  Concerns regarding medicines are outlined above.  Medication changes, Labs and Tests ordered today are listed in the Patient Instructions below. Patient Instructions  Medication Instructions:  Your physician recommends that you continue on your current medications as directed. Please refer to the Current Medication list given to you today. If you need a refill on your cardiac medications before your next  appointment, please call your pharmacy.   Lab work: Your physician recommends that you return for lab work in: TODAY-CMET If you have labs (blood work) drawn today and your tests are completely normal, you will receive your results only by:  [place] (if you have MyChart) OR  A paper copy in the mail If you have any lab test that is abnormal or we need to change your treatment, we will  call you to review the results.  Testing/Procedures: NONE   Follow-Up: At [facility], you and your health needs are our priority.  As part of our continuing mission to provide you with exceptional heart care, we have created designated Provider Care Teams.  These Care Teams include your primary Cardiologist (physician) and Advanced Practice Providers (APPs -  Physician Assistants and Nurse Practitioners) who all work together to provide you with the care you need, when you need it.   Your physician recommends that you schedule a follow-up appointment in: 1-2 WEEKS WITH [name] Grammatico, PA-C  Any Other Special Instructions Will Be Listed Below (If Applicable).      Hilbert Corrigan, [place]  12/08/2018 4:54 PM    [facility] [place], [place], [place]  [address] Phone: [phone]; Fax: [phone]

## 2018-12-08 ENCOUNTER — Encounter: Payer: Self-pay | Admitting: Physician Assistant

## 2018-12-08 LAB — COMPREHENSIVE METABOLIC PANEL
ALT: 17 IU/L (ref 0–44)
AST: 24 IU/L (ref 0–40)
Albumin/Globulin Ratio: 1.3 (ref 1.2–2.2)
Albumin: 3.5 g/dL — ABNORMAL LOW (ref 3.7–4.7)
Alkaline Phosphatase: 134 IU/L — ABNORMAL HIGH (ref 39–117)
BUN/Creatinine Ratio: 13 (ref 10–24)
BUN: 24 mg/dL (ref 8–27)
Bilirubin Total: 0.6 mg/dL (ref 0.0–1.2)
CO2: 27 mmol/L (ref 20–29)
Calcium: 9.2 mg/dL (ref 8.6–10.2)
Chloride: 95 mmol/L — ABNORMAL LOW (ref 96–106)
Creatinine, Ser: 1.82 mg/dL — ABNORMAL HIGH (ref 0.76–1.27)
GFR calc Af Amer: 41 mL/min/{1.73_m2} — ABNORMAL LOW (ref >59)
GFR calc non Af Amer: 36 mL/min/{1.73_m2} — ABNORMAL LOW (ref >59)
Globulin, Total: 2.8 g/dL (ref 1.5–4.5)
Glucose: 91 mg/dL (ref 65–99)
Potassium: 4.6 mmol/L (ref 3.5–5.2)
Sodium: 134 mmol/L (ref 134–144)
Total Protein: 6.3 g/dL (ref 6.0–8.5)

## 2018-12-10 ENCOUNTER — Other Ambulatory Visit: Payer: Self-pay

## 2018-12-10 DIAGNOSIS — N189 Chronic kidney disease, unspecified: Secondary | ICD-10-CM

## 2018-12-10 DIAGNOSIS — N289 Disorder of kidney and ureter, unspecified: Secondary | ICD-10-CM

## 2018-12-10 DIAGNOSIS — I5021 Acute systolic (congestive) heart failure: Secondary | ICD-10-CM

## 2018-12-10 NOTE — Progress Notes (Signed)
I spoke with the patient and gave him the result on Saturday. Renal function was slightly down. I recommended a dose of metolazone with extra potassium once weekly on Saturday. He will need BMET in 7-10 days and I will see him back in 2 weeks

## 2018-12-10 NOTE — Progress Notes (Signed)
Orders placed.

## 2018-12-18 ENCOUNTER — Other Ambulatory Visit: Payer: Self-pay | Admitting: Cardiovascular Disease

## 2018-12-18 DIAGNOSIS — Z9889 Other specified postprocedural states: Secondary | ICD-10-CM

## 2018-12-18 DIAGNOSIS — I701 Atherosclerosis of renal artery: Secondary | ICD-10-CM

## 2018-12-19 ENCOUNTER — Ambulatory Visit (HOSPITAL_COMMUNITY)
Admission: RE | Admit: 2018-12-19 | Payer: Medicare Other | Source: Ambulatory Visit | Attending: Cardiovascular Disease | Admitting: Cardiovascular Disease

## 2018-12-21 ENCOUNTER — Other Ambulatory Visit (HOSPITAL_COMMUNITY): Payer: Self-pay | Admitting: Cardiovascular Disease

## 2018-12-21 DIAGNOSIS — Z9889 Other specified postprocedural states: Secondary | ICD-10-CM

## 2018-12-21 DIAGNOSIS — I701 Atherosclerosis of renal artery: Secondary | ICD-10-CM

## 2018-12-25 ENCOUNTER — Other Ambulatory Visit: Payer: Self-pay

## 2018-12-25 ENCOUNTER — Ambulatory Visit: Payer: Medicare Other | Admitting: Physician Assistant

## 2018-12-25 ENCOUNTER — Encounter: Payer: Self-pay | Admitting: Physician Assistant

## 2018-12-25 VITALS — BP 116/62 | HR 90 | Ht 65.0 in | Wt 136.2 lb

## 2018-12-25 DIAGNOSIS — I350 Nonrheumatic aortic (valve) stenosis: Secondary | ICD-10-CM

## 2018-12-25 DIAGNOSIS — I739 Peripheral vascular disease, unspecified: Secondary | ICD-10-CM

## 2018-12-25 DIAGNOSIS — I25810 Atherosclerosis of coronary artery bypass graft(s) without angina pectoris: Secondary | ICD-10-CM

## 2018-12-25 DIAGNOSIS — I5021 Acute systolic (congestive) heart failure: Secondary | ICD-10-CM

## 2018-12-25 DIAGNOSIS — D649 Anemia, unspecified: Secondary | ICD-10-CM

## 2018-12-25 DIAGNOSIS — E785 Hyperlipidemia, unspecified: Secondary | ICD-10-CM

## 2018-12-25 DIAGNOSIS — I10 Essential (primary) hypertension: Secondary | ICD-10-CM

## 2018-12-25 DIAGNOSIS — I4819 Other persistent atrial fibrillation: Secondary | ICD-10-CM

## 2018-12-25 DIAGNOSIS — J90 Pleural effusion, not elsewhere classified: Secondary | ICD-10-CM

## 2018-12-25 DIAGNOSIS — I6523 Occlusion and stenosis of bilateral carotid arteries: Secondary | ICD-10-CM

## 2018-12-25 DIAGNOSIS — I701 Atherosclerosis of renal artery: Secondary | ICD-10-CM

## 2018-12-25 NOTE — Patient Instructions (Signed)
Medication Instructions:   Continue to take Metolazone 2.5 mg 2 times a week *If you need a refill on your cardiac medications before your next appointment, please call your pharmacy*  Lab Work: You will need to have lab (blood work) drawn this Friday 12/28/2018 by your Primary Care Physician:  BMET If you have labs (blood work) drawn today and your tests are completely normal, you will receive your results only by: [place] MyChart Message (if you have MyChart) OR . A paper copy in the mail If you have any lab test that is abnormal or we need to change your treatment, we will call you to review the results.  Testing/Procedures: NONE ordered at this time of appointment   Follow-Up: At [facility], you and your health needs are our priority.  As part of our continuing mission to provide you with exceptional heart care, we have created designated Provider Care Teams.  These Care Teams include your primary Cardiologist (physician) and Advanced Practice Providers (APPs -  Physician Assistants and Nurse Practitioners) who all work together to provide you with the care you need, when you need it.  Your next appointment:   Follow up in 1-2 months   The format for your next appointment:   In Person  Provider:   Shelva Majestic, MD  Other Instructions

## 2018-12-25 NOTE — Progress Notes (Signed)
Cardiology Office Note    Date:  12/27/2018   ID:  [name] 20-Nov-1945, MRN [id number]  PCP:  [name] Brothers, MD  Cardiologist:   [name] Majestic, MD Electrophysiologist:[name] Meredith Leeds, MD  Chief Complaint  Patient presents with   Follow-up    seen for [name].     History of Present Illness:  [name] is a 73 y.o. male with PMH of CAD s/p [place] and redo CABG in 1996,PADwith bilateral SFA occlusion,carotid artery disease s/pright [id number], right bundle branch block, renal artery stenosis s/pright renal artery stenting andrepeatintervention, subclavian artery stenosis,CKD stage II-III,hypertension, hyperlipidemia, and persistent atrial fibrillation. In 1998, he underwent stenting of his left main artery and stenting of SVG to OM. He had stenting of SVG to marginal vessel in 2012. Myoview in May 2015 showed mild inferobasal ischemia likely in the RCA distribution. He was started on Eliquis in May 2018 after discontinuing the Plavix. Repeat echocardiogram in June 2018 showed EF 55 to 60%, PA peak pressure elevated at 48 mmHg, moderate LVH. Due to recurrent atrial fibrillation, he was referred to [name] in January 2019. Due to QTC prolongation, he was not felt to be a candidate for Tikosyn or sotalol and also felt to be high risk for ablation. As result, he was recommended to reinitiate amiodarone therapy in February 2019 and he subsequently underwent successful cardioversion. Most recent carotid Doppler obtained in February 2020 showed 40 to 59% bilateral ICA disease, right subclavian artery was stenotic, left subclavian artery flow was disturbed.  He was admitted in May 2020 with progressive abdominal pain and generalized weakness.  D-dimer was elevated and troponin was elevated as well.  Cardiology was consulted and felt the troponin elevation was due to demand ischemia as he specifically denied any recent chest pain. Troponin  eventually peaked at 5.16 before trending back down. Work-up did not demonstrate any PE however did notice SMA stenosis. Vascular surgery was consulted for mesenteric ischemia. He eventually underwent selective celiac and superior mesenteric angiogram and stenting of the celiac artery and superior mesenteric artery by [name].He returned to the hospital on 5/18 due to significant heartburn, nausea, vomiting and dysphasia. He was evaluated by GI with endoscopy on 5/20 revealing nonbleeding esophageal ulcer, benign-appearing esophageal stenosis, gastric stenosis at the pylorus. Esophageal stricture was not dilated due to presence of ulcer per GI. 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 Vascular surgery was consulted and treated pseudoaneurysm with compression x2. Follow-up duplex showed aneurysm was successfully closed. Eliquis restarted on 5/23.  I last saw the patient as a virtual visit on 08/02/2018, he was doing well at the time.  I switched his short acting diltiazem to long-acting diltiazem.  His torsemide was doubled in June 2020 for lower extremity edema.  Patient was readmitted in August 2020 with generalized weakness and exercise intolerance.  He was noted to be severely anemic with hemoglobin down to 6.7.  He was transfused 2 units of packed red blood cell.  He also had acute kidney injury as well.  He was discharged on Protonix 40 mg twice daily and Carafate.  GI felt her anemia was not from the GI tract and recommended hematology consult.  He was restarted on Eliquis.  During the hospitalization, he was noted to have significant pleural effusion and underwent left and right thoracentesis. (1.7 L removed from right side, 650 cc removed from left side)   I last saw the  patient on 12/07/2018, since prior admission, he has been readmitted at [name] for volume overload.  Echocardiogram showed EF down to 35-40% with moderate to severe aortic  stenosis.  He also had moderate right-sided pleural effusion and underwent ultrasound-guided thoracentesis again on 9/24 with 2 L of pleural fluid drained.  Due to acute on chronic anemia, he was transfused 1 pack of red blood cell.  His Eliquis was also discontinued recently due to high risk of bleeding.  On physical exam, he had 2+ pitting edema indicative of significant volume overload.  His diuretic was switched to torsemide 40 mg twice daily upon discharge.  Patient presents today for cardiology office visit.  He still has 2+ pitting edema, however his weight is down by 10 pounds and he is breathing better as well.  According to the patient, recent lab work by his oncologist revealed hemoglobin is also coming up.  I have reviewed the recent echocardiogram result and the hospital record with the patient.  It is surprising that her EF dropped from the previous 50 to 55% in June down to 35 to 40% at [facility].  Previous echocardiogram in June also revealed mild aortic stenosis whereas echocardiogram obtained at [facility] showed moderate to severe aortic stenosis.  The rapid progression of aortic stenosis make me suspect that while of the echo report was faulty.  I would check with [name] to see if he would recommend a repeat echocardiogram.  Otherwise, patient has self increased his metolazone to twice weekly dosing, I am fine with this.  He is due for repeat blood work at his PCPs office this Friday.  I recommend a basic metabolic panel at that time.  Overall, he is doing better compared to the last office visit.   Past Medical History:  Diagnosis Date   Anemia    Angina    Atrial fibrillation ([place])    Blood transfusion    Bronchitis    Bulging discs    "8 of them; thoracic, lumbar, sacral area"   Chronic back pain    Colon polyps    Complication of anesthesia    Coronary artery disease 01/24/11   Successful PCI long segmental stensois  vein graft to the CX marginal  vessel-graft previously stented prox. 3.0x46mm TAXUS stent mid seg 3.0x26mm TAXUS stent now tandem stens of 3.0x14mm & 3.0x7mm placed with the seg. 50,80 & 99% stenosis reduced to 0%   DDD (degenerative disc disease)    Diastolic heart failure ([place])    Dysrhythmia    "PAC's and PVC's"   Esophageal dilatation    "have had it done 7 times; last time ~ 2001"   Esophageal stricture    GERD (gastroesophageal reflux disease)    Headache(784.0)    Hiatal hernia    Hyperlipidemia    Myocardial infarction [facility]) 1991   Myocardial infarction ([place]) 2005   "had 3 heart attacks this year"   Paroxysmal atrial fibrillation ([place])    Pyloric stenosis    RBBB    Renal artery stenosis (HCC)    hhistory of right renal artery stenting and re-intervention for "in-stent restenosis.   S/P CABG (coronary artery bypass graft) [place] of breath    "when I had the heart attacks"   Subclavian artery stenosis, left ([place])    Ulcer    "small; Dr. Henrene Pastor found it 02/2010"    Past Surgical History:  Procedure Laterality Date  ADENOIDECTOMY     "when I was an infant"   [place] N/A 10/14/2018   Procedure: BALLOON DILATION;  Surgeon: Lavena Bullion, DO;  Location: [place];  Service: Gastroenterology;  Laterality: N/A;   BIOPSY  07/26/2018   Procedure: BIOPSY;  Surgeon: Thornton Park, MD;  Location: [place];  Service: Gastroenterology;;   BIOPSY  10/14/2018   Procedure: BIOPSY;  Surgeon: Lavena Bullion, DO;  Location: [place];  Service: Gastroenterology;;   CARDIOVERSION N/A 04/17/2017   Procedure: CARDIOVERSION;  Surgeon: Jerline Pain, MD;  Location: [place];  Service: Cardiovascular;  Laterality: N/A;   CAROTID ENDARTERECTOMY Right 1996   CE   COLONOSCOPY WITH PROPOFOL N/A 10/14/2018   Procedure: COLONOSCOPY WITH PROPOFOL;  Surgeon: Lavena Bullion, DO;  Location: [place];  Service: Gastroenterology;  Laterality: N/A;   CORONARY ANGIOPLASTY WITH STENT PLACEMENT  01/24/11   "today makes a total of 9 stents"   CORONARY ARTERY BYPASS GRAFT  1984   CABG X 4   CORONARY ARTERY BYPASS GRAFT  1996   CABG X 4   ESOPHAGOGASTRODUODENOSCOPY (EGD) WITH PROPOFOL N/A 07/26/2018   Procedure: ESOPHAGOGASTRODUODENOSCOPY (EGD) WITH PROPOFOL;  Surgeon: Thornton Park, MD;  Location: [place];  Service: Gastroenterology;  Laterality: N/A;   ESOPHAGOGASTRODUODENOSCOPY (EGD) WITH PROPOFOL N/A 10/14/2018   Procedure: ESOPHAGOGASTRODUODENOSCOPY (EGD) WITH PROPOFOL;  Surgeon: Lavena Bullion, DO;  Location: [place];  Service: Gastroenterology;  Laterality: N/A;   IR THORACENTESIS ASP PLEURAL SPACE W/IMG GUIDE  10/12/2018   LEFT HEART CATHETERIZATION WITH CORONARY/GRAFT ANGIOGRAM N/A 01/24/2011   Procedure: LEFT HEART CATHETERIZATION WITH Beatrix Fetters;  Surgeon: Troy Sine, MD;  Location: [facility] CATH LAB;  Service: Cardiovascular;  Laterality: N/A;   PERIPHERAL VASCULAR INTERVENTION  07/16/2018   Procedure: PERIPHERAL VASCULAR INTERVENTION;  Surgeon: Elam Dutch, MD;  Location: [place] CV LAB;  Service: Cardiovascular;;  SMA and Celiac   pylondial cyst removal     RETINAL DETACHMENT SURGERY  04/2007   right   TONSILLECTOMY  1972   VISCERAL ANGIOGRAPHY N/A 07/16/2018   Procedure: VISCERAL ANGIOGRAPHY;  Surgeon: Elam Dutch, MD;  Location: [place] CV LAB;  Service: Cardiovascular;  Laterality: N/A;    Current Medications: Outpatient Medications Prior to Visit  Medication Sig Dispense Refill   butalbital-aspirin-caffeine (FIORINAL) 50-325-40 MG capsule Take 1 capsule by mouth daily.     calcium carbonate (TUMS - DOSED IN MG ELEMENTAL CALCIUM) 500 MG chewable tablet Chew 1 tablet (200 mg of elemental calcium total) by mouth 3 (three) times daily as needed for indigestion or heartburn. 30 tablet 0   diltiazem (CARDIZEM CD)  120 MG 24 hr capsule Take 1 capsule (120 mg total) by mouth daily. 30 capsule 6   dutasteride (AVODART) 0.5 MG capsule Take 0.5 mg by mouth daily.      ezetimibe (ZETIA) 10 MG tablet TAKE 1 TABLET (10 MG TOTAL) BY MOUTH DAILY. 90 tablet 3   finasteride (PROSCAR) 5 MG tablet Take 5 mg by mouth daily.     Gabapentin Enacarbil (HORIZANT) 600 MG TBCR Take by mouth at bedtime.     Ketotifen Fumarate (ALAWAY OP) Place 1 drop into the right eye daily. Uses before putting contact Lens in R eye     KLOR-CON M20 20 MEQ tablet Take 20 mEq by mouth daily.     loratadine (CLARITIN) 10 MG tablet Take 10 mg by mouth daily as needed for allergies, rhinitis or itching.  3   lovastatin (MEVACOR) 40 MG tablet TAKE 1 TABLET BY MOUTH AT BEDTIME 90 tablet 3   metolazone (ZAROXOLYN) 2.5 MG tablet Take 2.5 mg by mouth 2 (two) times a week. Monday, Friday     Multiple Vitamins-Minerals (MULTIVITAMINS THER. W/MINERALS) TABS Take 1 tablet by mouth daily.       nitroGLYCERIN (NITROLINGUAL) 0.4 MG/SPRAY spray Place 1 spray under the tongue every 5 (five) minutes x 3 doses as needed for chest pain. 4.9 g 2   Potassium Chloride ER 20 MEQ TBCR Take 1 tablet by mouth daily.     ranolazine (RANEXA) 1000 MG SR tablet Take 500 mg by mouth 2 (two) times daily.     senna-docusate (SENOKOT-S) 8.6-50 MG tablet Take 1 tablet by mouth daily. 30 tablet 6   tamsulosin (FLOMAX) 0.4 MG CAPS capsule Take 0.4 mg by mouth daily.     torsemide (DEMADEX) 20 MG tablet Take 1 tablet (20 mg total) by mouth 2 (two) times daily. FOR SWELLING OR WEIGHT GAIN 180 tablet 3   vitamin E 400 UNIT capsule Take 400 Units by mouth daily.      esomeprazole (NEXIUM) 40 MG capsule Take 1 capsule (40 mg total) by mouth 2 (two) times daily before a meal. 120 capsule 3   sucralfate (CARAFATE) 1 GM/10ML suspension Take 10 mLs (1 g total) by mouth 4 (four) times daily -  with meals and at bedtime. 1200 mL 11   No facility-administered medications  prior to visit.      Allergies:   Codeine, Sulfa antibiotics, and Sulfonamide derivatives   Social History   Socioeconomic History   Marital status: Married    Spouse name: Not on file   Number of children: Not on file   Years of education: Not on file   Highest education level: Not on file  Occupational History   Not on file  Social Needs   Financial resource strain: Not hard at all   Food insecurity    Worry: Never true    Inability: Never true   Transportation needs    Medical: No    Non-medical: No  Tobacco Use   Smoking status: Former Smoker    Packs/day: 1.00    Years: 38.00    Pack years: 38.00    Types: Cigarettes    Quit date: 02/09/2005    Years since quitting: 13.8   Smokeless tobacco: Never Used   Tobacco comment: "quit smoking cigarrettes 2006"  Substance and Sexual Activity   Alcohol use: No    Alcohol/week: 0.0 standard drinks   Drug use: No   Sexual activity: Not on file  Lifestyle   Physical activity    Days per week: 0 days    Minutes per session: 0 min   Stress: Only a little  Relationships   Press photographer on phone: Not on file    Gets together: Not on file    Attends religious service: Not on file    Active member of club or organization: No    Attends meetings of clubs or organizations: Never    Relationship status: Married  Other Topics Concern   Not on file  Social History Narrative   Not on file     Family History:  The patient's family history includes AAA (abdominal aortic aneurysm) in his father; Breast cancer in his paternal aunt; Diabetes in his sister; Heart attack in his father; Heart disease in his father; Heart failure in his  father; Hypertension in his mother; Stroke in his mother; Ulcers in his father.   ROS:   Please see the history of present illness.    ROS All other systems reviewed and are negative.   PHYSICAL EXAM:   VS:  BP 116/62    Pulse 90    Ht 5\' 5"  (1.651 m)    Wt 136 lb  3.2 oz (61.8 kg)    SpO2 99%    BMI 22.66 kg/m    GEN: Well nourished, well developed, in no acute distress  HEENT: normal  Neck: no JVD, carotid bruits, or masses Cardiac: Irregularly irregular; no murmurs, rubs, or gallops. 2+ edema  Respiratory:  clear to auscultation bilaterally, normal work of breathing GI: soft, nontender, nondistended, + BS MS: no deformity or atrophy  Skin: warm and dry, no rash Neuro:  Alert and Oriented x 3, Strength and sensation are intact Psych: euthymic mood, full affect  Wt Readings from Last 3 Encounters:  12/25/18 136 lb 3.2 oz (61.8 kg)  12/07/18 146 lb (66.2 kg)  11/02/18 142 lb 6 oz (64.6 kg)      Studies/Labs Reviewed:   EKG:  EKG is not ordered today.    Recent Labs: 07/23/2018: B Natriuretic Peptide 1,122.4 07/24/2018: TSH 0.857 10/10/2018: Magnesium 2.1 10/13/2018: Platelets 130 10/15/2018: Hemoglobin 8.2 12/07/2018: ALT 17; BUN 24; Creatinine, Ser 1.82; Potassium 4.6; Sodium 134   Lipid Panel    Component Value Date/Time   CHOL 143 05/25/2018 1027   TRIG 84 05/25/2018 1027   HDL 63 05/25/2018 1027   CHOLHDL 2.3 05/25/2018 1027   CHOLHDL 2.6 08/01/2014 0852   VLDL 18 08/01/2014 0852   LDLCALC 63 05/25/2018 1027    Additional studies/ records that were reviewed today include:   Echo 08/27/2018 IMPRESSIONS    1. The left ventricle has low normal systolic function, with an ejection fraction of 50-55%. The cavity size was normal. There is mild asymmetric left ventricular hypertrophy. Left ventricular diastolic Doppler parameters are indeterminate.  2. Septal and inferior basal hypokinesis.  3. The right ventricle has normal systolic function. The cavity was normal. There is no increase in right ventricular wall thickness.  4. Left atrial size was moderately dilated.  5. The mitral valve is degenerative. Moderate thickening of the mitral valve leaflet. Moderate calcification of the mitral valve leaflet. There is moderate mitral  annular calcification present. The MR jet is anteriorly-directed.  6. The aortic valve is tricuspid. Severely thickening of the aortic valve. Severe calcifcation of the aortic valve. Mild stenosis of the aortic valve.   ASSESSMENT:    1. Acute systolic heart failure ([place])   2. Coronary artery disease involving coronary bypass graft of native heart without angina pectoris   3. PAD (peripheral artery disease) ([place])   4. Bilateral carotid artery stenosis   5. Renal artery stenosis ([place])   6. Essential hypertension   7. Hyperlipidemia LDL goal <70   8. Persistent atrial fibrillation ([place])   9. Pleural effusion   10. Nonrheumatic aortic valve stenosis      PLAN:  In order of problems listed above:  1. Acute systolic heart failure: Recent echocardiogram obtained at [facility] showed decreased EF of 35%.  Previous echocardiogram in June showed EF was 50 to 55% to time.  After his diuretic was adjusted during the last office visit, he has left managed to lose 10 pounds.  His lower extremity edema has improved.  2. Moderate to severe aortic stenosis: Seen on recent  echocardiogram at [facility], however previous echo in June 2020 only showed mild aortic stenosis.  I [name] discuss with [name] to see if we should obtain a repeat echocardiogram given the discrepancy between the 2 echo  3. CAD: No recent chest pain.  4. PAD: Recently underwent mesenteric artery stent.  Also has bilateral lower extremity artery disease  5. Carotid artery disease: Stable on recent Doppler  6. Renal artery disease: History of right renal artery stenting  7. Hypertension: Blood pressure stable  8. Hyperlipidemia: Continue Zetia  9. Recurrent pleural effusion: Underwent 2 recent thoracentesis  10. Persistent atrial fibrillation: No longer on anticoagulation due to severe anemia.  11. Anemia: Followed by hematology service.  Hemoglobin improving on recent lab work.    Medication  Adjustments/Labs and Tests Ordered: Current medicines are reviewed at length with the patient today.  Concerns regarding medicines are outlined above.  Medication changes, Labs and Tests ordered today are listed in the Patient Instructions below. Patient Instructions  Medication Instructions:   Continue to take Metolazone 2.5 mg 2 times a week *If you need a refill on your cardiac medications before your next appointment, please call your pharmacy*  Lab Work: You [name] need to have lab (blood work) drawn this Friday 12/28/2018 by your Primary Care Physician:  BMET If you have labs (blood work) drawn today and your tests are completely normal, you [name] receive your results only by:  [place] (if you have MyChart) OR  A paper copy in the mail If you have any lab test that is abnormal or we need to change your treatment, we [name] call you to review the results.  Testing/Procedures: NONE ordered at this time of appointment   Follow-Up: At [facility], you and your health needs are our priority.  As part of our continuing mission to provide you with exceptional heart care, we have created designated Provider Care Teams.  These Care Teams include your primary Cardiologist (physician) and Advanced Practice Providers (APPs -  Physician Assistants and Nurse Practitioners) who all work together to provide you with the care you need, when you need it.  Your next appointment:   Follow up in 1-2 months   The format for your next appointment:   In Person  Provider:   Shelva Majestic, MD  Other Instructions      Signed, Almyra Deforest, [place]  12/27/2018 11:09 PM    [address], [place], [place]  [address] Phone: [phone]; Fax: [phone]

## 2018-12-27 ENCOUNTER — Ambulatory Visit (HOSPITAL_COMMUNITY)
Admission: RE | Admit: 2018-12-27 | Discharge: 2018-12-27 | Disposition: A | Discharge status: Home / Self Care | Payer: Medicare Other | Source: Ambulatory Visit | Attending: Cardiology | Admitting: Cardiology

## 2018-12-27 ENCOUNTER — Encounter: Payer: Self-pay | Admitting: Physician Assistant

## 2018-12-27 ENCOUNTER — Other Ambulatory Visit: Payer: Self-pay

## 2018-12-27 DIAGNOSIS — Z9889 Other specified postprocedural states: Secondary | ICD-10-CM

## 2018-12-27 DIAGNOSIS — I701 Atherosclerosis of renal artery: Secondary | ICD-10-CM | POA: Diagnosis not present

## 2019-01-01 ENCOUNTER — Telehealth: Payer: Self-pay | Admitting: Physician Assistant

## 2019-01-01 ENCOUNTER — Other Ambulatory Visit: Payer: Self-pay

## 2019-01-01 DIAGNOSIS — I519 Heart disease, unspecified: Secondary | ICD-10-CM

## 2019-01-01 NOTE — Telephone Encounter (Signed)
I called and spoke to [name], [place] [name]'s wife.  I have spoken with Dr. Claiborne Billings who recommended a repeat echocardiogram given the discrepancy between his echocardiogram done at [facility] and previous echocardiogram obtained in June.  The ejection fraction went down and also his aortic valve stenosis has worsened on the recent echocardiogram.  I will asked my staff to reach out to [name] and arrange for the echocardiogram.  Also according to [name], [name] had a episode last week where he lost the function of his limp for several minutes.  He has regained the function without further neurological complication.  It is difficult to say whether or not he had a stroke, however his bleeding risk on the Eliquis is still very high.  This is a tough situation and at this time I do not think restarting the Eliquis is a good idea either.  I recommended continue observation and if she notice [name] have another episode, she is instructed to take him to the hospital immediately.  Since the episode last week where he lost motor function, he has been very nauseated and has dizziness.  During this period, he has not been eating very much or drinking very much.  I am concerned that he may be dehydrated during this period, since he is still on the diuretic and has poor oral intake.  I asked him to hold torsemide for today and tomorrow morning, restart afterward.  He has been instructed to hold metolazone until his appetite returns and if he feels better.  His wife did mention his overall condition is improving over the weekend.  He is at very high risk for readmission and his clinical condition is very fragile at this time.  He will need close monitoring.

## 2019-01-01 NOTE — Telephone Encounter (Signed)
Order placed for Echo will route to staff to schedule patient.

## 2019-01-01 NOTE — Progress Notes (Signed)
Order placed per Almyra Deforest, PA-C

## 2019-01-10 ENCOUNTER — Telehealth: Payer: Self-pay | Admitting: Physician Assistant

## 2019-01-10 NOTE — Telephone Encounter (Signed)
New Message      Pt is calling and would like to speak with [name]  He says he received a call to take an extra Potassium tablet  He is not sure if it is suppose to be everyday or only for that day    Please call

## 2019-01-18 ENCOUNTER — Ambulatory Visit (HOSPITAL_COMMUNITY): Payer: Medicare Other | Attending: Cardiovascular Disease

## 2019-01-18 ENCOUNTER — Other Ambulatory Visit: Payer: Self-pay

## 2019-01-18 DIAGNOSIS — I080 Rheumatic disorders of both mitral and aortic valves: Secondary | ICD-10-CM | POA: Insufficient documentation

## 2019-01-18 DIAGNOSIS — I252 Old myocardial infarction: Secondary | ICD-10-CM | POA: Diagnosis not present

## 2019-01-18 DIAGNOSIS — I519 Heart disease, unspecified: Secondary | ICD-10-CM

## 2019-01-18 DIAGNOSIS — I4891 Unspecified atrial fibrillation: Secondary | ICD-10-CM | POA: Insufficient documentation

## 2019-01-18 DIAGNOSIS — I2510 Atherosclerotic heart disease of native coronary artery without angina pectoris: Secondary | ICD-10-CM | POA: Diagnosis not present

## 2019-01-22 ENCOUNTER — Other Ambulatory Visit: Payer: Self-pay | Admitting: Physician Assistant

## 2019-02-04 DIAGNOSIS — N289 Disorder of kidney and ureter, unspecified: Secondary | ICD-10-CM | POA: Diagnosis not present

## 2019-02-04 DIAGNOSIS — D6489 Other specified anemias: Secondary | ICD-10-CM

## 2019-02-14 ENCOUNTER — Ambulatory Visit: Payer: Medicare Other | Admitting: Cardiovascular Disease

## 2019-02-20 ENCOUNTER — Encounter: Payer: Self-pay | Admitting: Cardiovascular Disease

## 2019-02-20 ENCOUNTER — Telehealth (INDEPENDENT_AMBULATORY_CARE_PROVIDER_SITE_OTHER): Payer: Medicare Other | Admitting: Cardiovascular Disease

## 2019-02-20 VITALS — BP 118/70 | HR 83 | Wt 130.5 lb

## 2019-02-20 DIAGNOSIS — I701 Atherosclerosis of renal artery: Secondary | ICD-10-CM | POA: Diagnosis not present

## 2019-02-20 DIAGNOSIS — I6523 Occlusion and stenosis of bilateral carotid arteries: Secondary | ICD-10-CM | POA: Diagnosis not present

## 2019-02-20 DIAGNOSIS — I25810 Atherosclerosis of coronary artery bypass graft(s) without angina pectoris: Secondary | ICD-10-CM

## 2019-02-20 DIAGNOSIS — I771 Stricture of artery: Secondary | ICD-10-CM | POA: Diagnosis not present

## 2019-02-20 DIAGNOSIS — I739 Peripheral vascular disease, unspecified: Secondary | ICD-10-CM

## 2019-02-20 NOTE — Patient Instructions (Signed)
Medication Instructions:  Your physician recommends that you continue on your current medications as directed. Please refer to the Current Medication list given to you today.  If you need a refill on your cardiac medications before your next appointment, please call your pharmacy.   Lab work: NONE  Testing/Procedures: Your physician has requested that you have a carotid duplex in  August 2021. This test is an ultrasound of the carotid arteries in your neck. It looks at blood flow through these arteries that supply the brain with blood. Allow one hour for this exam. There are no restrictions or special instructions.  AND  Your physician has requested that you have a renal artery duplex in  October 2021. During this test, an ultrasound is used to evaluate blood flow to the kidneys. Allow one hour for this exam. Do not eat after midnight the day before and avoid carbonated beverages. Take your medications as you usually do.   Follow-Up: At [facility], you and your health needs are our priority.  As part of our continuing mission to provide you with exceptional heart care, we have created designated Provider Care Teams.  These Care Teams include your primary Cardiologist (physician) and Advanced Practice Providers (APPs -  Physician Assistants and Nurse Practitioners) who all work together to provide you with the care you need, when you need it. You may see Dr Gwenlyn Found or one of the following Advanced Practice Providers on your designated Care Team:    Kerin Ransom, PA-C  Colledge Newton, [place]  Coletta Memos, [place]  Your physician wants you to follow-up in: 1 year

## 2019-02-20 NOTE — Progress Notes (Signed)
Virtual Visit via Telephone Note   This visit type was conducted due to national recommendations for restrictions regarding the COVID-19 Pandemic (e.g. social distancing) in an effort to limit this patient's exposure and mitigate transmission in our community.  Due to his co-morbid illnesses, this patient is at least at moderate risk for complications without adequate follow up.  This format is felt to be most appropriate for this patient at this time.  The patient did not have access to video technology/had technical difficulties with video requiring transitioning to audio format only (telephone).  All issues noted in this document were discussed and addressed.  No physical exam could be performed with this format.  Please refer to the patient's chart for his  consent to telehealth for [facility].   Date:  02/20/2019   ID:  Henok, Heacock [birth year], MRN [id number]  Patient Location: Home Provider Location: Home  PCP:  Garwin Brothers, MD  Cardiologist:  Shelva Majestic, MD  Electrophysiologist:  Constance Haw, MD  Peripheral vascular cardiologist: Dr. Quay Burow  Evaluation Performed:  Follow-Up Visit  Chief Complaint: Peripheral arterial disease  History of Present Illness:    Raymir Eros Montour is a 73 y.o. thin-appearing Caucasian male patient of [name], formally a patient [name]. I last saw him in the office  08/17/2018. He has a long extensive vascular history back to 1994 when he had coronary bypass grafting at [facility]. He had redo bypass grafting at [facility] January 1996 with simultaneous right carotid endarterectomy performed by Dr. Donnetta Hutching. His other problems include hypertension, hyperlipidemia, peripheral vascular disease with occluded SFAs although with cardiac rehabilitation he no longer has claudication. He had right renal artery stenting by myself and Dr. Rollene Fare approximately 15 years ago with repeat intervention several  years thereafter because of what sounds like re-in-stent restenosis. He's had annual renal Doppler studies which have shown progressive left renal artery stenosis with slight decrease in the left kidney dimensions. His blood pressures have been well-controlled and his renal function is normal.  He has persistent A. fib on Eliquis, rate controlled.  He was recently hospitalized and had a non-STEMI thought to be related to demand ischemia.  He had stenting of his SMA and celiac axis by Dr. 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  He was sent back to me as an add-on today for progressive lower extremity edema when I saw him back in June.  We adjusted his diuretics.  He has been having problems with anemia as well as volume overload.  He has had transfusions in Epogen injections.  There is a discrepancy in the 2D echo at [facility] which suggested moderate to severe aortic stenosis as opposed to the echo performed at [facility] which suggested mild aortic stenosis.  Since I saw him 6 months ago he says that he is not short of breath.  Denies chest pain.  His volume status seems improved.  His edema is improved.  He did have renal Doppler studies performed 12/27/2018 which revealed a right renal aortic ratio of 4.2 and a left of 6.1.  His renal dimensions have remained stable.  His velocities have mildly increased since his prior renal Doppler study performed 08/23/2018.  Carotid Dopplers performed 10/25/2018 showed moderate right and mild to moderate left ICA stenosis.  Dr. Oneida Alar follows his mesenteric Dopplers for his previously placed stents for mesenteric ischemia.  The patient does not have symptoms concerning for COVID-19 infection (fever, chills, cough,  or new shortness of breath).    Past Medical History:  Diagnosis Date  . Anemia   . Angina   . Atrial fibrillation ([place])   . Blood transfusion   . Bronchitis   . Bulging discs    "8 of them; thoracic, lumbar, sacral area"  .  Chronic back pain   . Colon polyps   . Complication of anesthesia   . Coronary artery disease 01/24/11   Successful PCI long segmental stensois  vein graft to the CX marginal vessel-graft previously stented prox. 3.0x70mm TAXUS stent mid seg 3.0x61mm TAXUS stent now tandem stens of 3.0x81mm & 3.0x31mm placed with the seg. 50,80 & 99% stenosis reduced to 0%  . DDD (degenerative disc disease)   . Diastolic heart failure ([place])   . Dysrhythmia    "PAC's and PVC's"  . Esophageal dilatation    "have had it done 7 times; last time ~ 2001"  . Esophageal stricture   . GERD (gastroesophageal reflux disease)   . Headache(784.0)   . Hiatal hernia   . Hyperlipidemia   . Myocardial infarction ([place]) 1991  . Myocardial infarction [facility]) 2005   "had 3 heart attacks this year"  . Paroxysmal atrial fibrillation (H[facility])   . Pyloric stenosis   . RBBB   . Renal artery stenosis (H[facility])    hhistory of right renal artery stenting and re-intervention for "in-stent restenosis.  . S/P CABG (coronary artery bypass graft) [place]  . Shortness of breath    "when I had the heart attacks"  . Subclavian artery stenosis, left ([place])   . Ulcer    "small; Dr. Henrene Pastor found it 02/2010"   Past Surgical History:  Procedure Laterality Date  . ADENOIDECTOMY     "when I was an infant"  . APPENDECTOMY  1953  . BALLOON DILATION N/A 10/14/2018   Procedure: BALLOON DILATION;  Surgeon: Lavena Bullion, DO;  Location: [place];  Service: Gastroenterology;  Laterality: N/A;  . BIOPSY  07/26/2018   Procedure: BIOPSY;  Surgeon: Thornton Park, MD;  Location: [place];  Service: Gastroenterology;;  . BIOPSY  10/14/2018   Procedure: BIOPSY;  Surgeon: Lavena Bullion, DO;  Location: [place];  Service: Gastroenterology;;  . CARDIOVERSION N/A 04/17/2017   Procedure: CARDIOVERSION;  Surgeon: Jerline Pain, MD;  Location: [facility] ENDOSCOPY;  Service: Cardiovascular;  Laterality: N/A;  .  CAROTID ENDARTERECTOMY Right 1996   CE  . COLONOSCOPY WITH PROPOFOL N/A 10/14/2018   Procedure: COLONOSCOPY WITH PROPOFOL;  Surgeon: Lavena Bullion, DO;  Location: [place];  Service: Gastroenterology;  Laterality: N/A;  . CORONARY ANGIOPLASTY WITH STENT PLACEMENT  01/24/11   "today makes a total of 9 stents"  . CORONARY ARTERY BYPASS GRAFT  1984   CABG X 4  . CORONARY ARTERY BYPASS GRAFT  1996   CABG X 4  . ESOPHAGOGASTRODUODENOSCOPY (EGD) WITH PROPOFOL N/A 07/26/2018   Procedure: ESOPHAGOGASTRODUODENOSCOPY (EGD) WITH PROPOFOL;  Surgeon: Thornton Park, MD;  Location: [place];  Service: Gastroenterology;  Laterality: N/A;  . ESOPHAGOGASTRODUODENOSCOPY (EGD) WITH PROPOFOL N/A 10/14/2018   Procedure: ESOPHAGOGASTRODUODENOSCOPY (EGD) WITH PROPOFOL;  Surgeon: Lavena Bullion, DO;  Location: [place];  Service: Gastroenterology;  Laterality: N/A;  . IR THORACENTESIS ASP PLEURAL SPACE W/IMG GUIDE  10/12/2018  . LEFT HEART CATHETERIZATION WITH CORONARY/GRAFT ANGIOGRAM N/A 01/24/2011   Procedure: LEFT HEART CATHETERIZATION WITH Beatrix Fetters;  Surgeon: Troy Sine, MD;  Location: [facility] CATH LAB;  Service:  Cardiovascular;  Laterality: N/A;  . PERIPHERAL VASCULAR INTERVENTION  07/16/2018   Procedure: PERIPHERAL VASCULAR INTERVENTION;  Surgeon: Elam Dutch, MD;  Location: [place] CV LAB;  Service: Cardiovascular;;  SMA and Celiac  . pylondial cyst removal    . RETINAL DETACHMENT SURGERY  04/2007   right  . TONSILLECTOMY  1972  . VISCERAL ANGIOGRAPHY N/A 07/16/2018   Procedure: VISCERAL ANGIOGRAPHY;  Surgeon: Elam Dutch, MD;  Location: [place] CV LAB;  Service: Cardiovascular;  Laterality: N/A;     Current Meds  Medication Sig  . butalbital-aspirin-caffeine (FIORINAL) 50-325-40 MG capsule Take 1 capsule by mouth daily.  . calcium carbonate (TUMS - DOSED IN MG ELEMENTAL CALCIUM) 500 MG chewable tablet Chew 1 tablet (200 mg of elemental calcium total) by  mouth 3 (three) times daily as needed for indigestion or heartburn.  . diltiazem (CARDIZEM CD) 120 MG 24 hr capsule TAKE 1 CAPSULE BY MOUTH EVERY DAY  . dutasteride (AVODART) 0.5 MG capsule Take 0.5 mg by mouth daily.   Marland Kitchen ezetimibe (ZETIA) 10 MG tablet TAKE 1 TABLET (10 MG TOTAL) BY MOUTH DAILY.  . finasteride (PROSCAR) 5 MG tablet Take 5 mg by mouth daily.  . Gabapentin Enacarbil (HORIZANT) 600 MG TBCR Take by mouth at bedtime.  Marland Kitchen Ketotifen Fumarate (ALAWAY OP) Place 1 drop into the right eye daily. Uses before putting contact Lens in R eye  . KLOR-CON M20 20 MEQ tablet Take 20 mEq by mouth daily.  Marland Kitchen loratadine (CLARITIN) 10 MG tablet Take 10 mg by mouth daily as needed for allergies, rhinitis or itching.   . lovastatin (MEVACOR) 40 MG tablet TAKE 1 TABLET BY MOUTH AT BEDTIME  . metolazone (ZAROXOLYN) 2.5 MG tablet Take 2.5 mg by mouth 2 (two) times a week. Monday, Friday  . Multiple Vitamins-Minerals (MULTIVITAMINS THER. W/MINERALS) TABS Take 1 tablet by mouth daily.    . nitroGLYCERIN (NITROLINGUAL) 0.4 MG/SPRAY spray Place 1 spray under the tongue every 5 (five) minutes x 3 doses as needed for chest pain.  Marland Kitchen Potassium Chloride ER 20 MEQ TBCR Take 1 tablet by mouth daily.  . ranolazine (RANEXA) 1000 MG SR tablet Take 500 mg by mouth 2 (two) times daily.  Marland Kitchen senna-docusate (SENOKOT-S) 8.6-50 MG tablet Take 1 tablet by mouth daily.  . tamsulosin (FLOMAX) 0.4 MG CAPS capsule Take 0.4 mg by mouth daily.  Marland Kitchen torsemide (DEMADEX) 20 MG tablet Take 1 tablet (20 mg total) by mouth 2 (two) times daily. FOR SWELLING OR WEIGHT GAIN  . vitamin E 400 UNIT capsule Take 400 Units by mouth daily.      Allergies:   Codeine, Sulfa antibiotics, and Sulfonamide derivatives   Social History   Tobacco Use  . Smoking status: Former Smoker    Packs/day: 1.00    Years: 38.00    Pack years: 38.00    Types: Cigarettes    Quit date: 02/09/2005    Years since quitting: 14.0  . Smokeless tobacco: Never Used  .  Tobacco comment: "quit smoking cigarrettes 2006"  Substance Use Topics  . Alcohol use: No    Alcohol/week: 0.0 standard drinks  . Drug use: No     Family Hx: The patient's family history includes AAA (abdominal aortic aneurysm) in his father; Breast cancer in his paternal aunt; Diabetes in his sister; Heart attack in his father; Heart disease in his father; Heart failure in his father; Hypertension in his mother; Stroke in his mother; Ulcers in his father. There is no  history of Colon cancer.  ROS:   Please see the history of present illness.     All other systems reviewed and are negative.   Prior CV studies:   The following studies were reviewed today:  Renal Doppler studies-12/27/2018 Carotid Doppler studies-10/25/2018  Labs/Other Tests and Data Reviewed:    EKG:  No ECG reviewed.  Recent Labs: 07/23/2018: B Natriuretic Peptide 1,122.4 07/24/2018: TSH 0.857 10/10/2018: Magnesium 2.1 10/13/2018: Platelets 130 10/15/2018: Hemoglobin 8.2 12/07/2018: ALT 17; BUN 24; Creatinine, Ser 1.82; Potassium 4.6; Sodium 134   Recent Lipid Panel Lab Results  Component Value Date/Time   CHOL 143 05/25/2018 10:27 AM   TRIG 84 05/25/2018 10:27 AM   HDL 63 05/25/2018 10:27 AM   CHOLHDL 2.3 05/25/2018 10:27 AM   CHOLHDL 2.6 08/01/2014 08:52 AM   LDLCALC 63 05/25/2018 10:27 AM    Wt Readings from Last 3 Encounters:  02/20/19 130 lb 8 oz (59.2 kg)  12/25/18 136 lb 3.2 oz (61.8 kg)  12/07/18 146 lb (66.2 kg)     Objective:    Vital Signs:  BP 118/70   Pulse 83   Wt 130 lb 8 oz (59.2 kg)   BMI 21.72 kg/m    VITAL SIGNS:  reviewed a complete physical exam was not performed today since this was a virtual telemedicine phone visit  ASSESSMENT & PLAN:    1. Peripheral arterial disease-known bilateral SFA occlusions.  The patient denies lifestyle limiting claudication.  No further work-up is required at this time 2. Renal artery stenosis-history of remote right renal artery stenting by  myself and Dr. Rollene Fare over 15 years ago with repeat intervention separate deal years later for what sounds like "in-stent restenosis.  Recent renal Dopplers performed 12/27/2018 revealed a right renal aortic ratio of 4.2 and a left of 6.1 with stable renal dimensions.  His velocities have mildly increased since his previous renal Doppler study.  His blood pressures have remained stable and his renal function has as well.  We will continue to follow this noninvasively 3. Carotid artery disease-Dopplers performed 10/25/2018 showed moderate right and mild to moderate left ICA stenosis.  We will follow this noninvasively on annual basis 4. Mesenteric ischemia-history of mesenteric intervention by Dr. Oneida Alar.  He follows this noninvasively.  COVID-19 Education: The signs and symptoms of COVID-19 were discussed with the patient and how to seek care for testing (follow up with PCP or arrange E-visit).  The importance of social distancing was discussed today.  Time:   Today, I have spent 8 minutes with the patient with telehealth technology discussing the above problems.     Medication Adjustments/Labs and Tests Ordered: Current medicines are reviewed at length with the patient today.  Concerns regarding medicines are outlined above.   Tests Ordered: No orders of the defined types were placed in this encounter.   Medication Changes: No orders of the defined types were placed in this encounter.   Follow Up:  In Person in 1 year(s)  Signed, Quay Burow, MD  02/20/2019 8:47 AM    [place]

## 2019-02-21 ENCOUNTER — Telehealth (INDEPENDENT_AMBULATORY_CARE_PROVIDER_SITE_OTHER): Payer: Medicare Other | Admitting: Cardiovascular Disease

## 2019-02-21 ENCOUNTER — Encounter: Payer: Self-pay | Admitting: Cardiovascular Disease

## 2019-02-21 DIAGNOSIS — R600 Localized edema: Secondary | ICD-10-CM | POA: Diagnosis not present

## 2019-02-21 DIAGNOSIS — I4811 Longstanding persistent atrial fibrillation: Secondary | ICD-10-CM

## 2019-02-21 DIAGNOSIS — I25810 Atherosclerosis of coronary artery bypass graft(s) without angina pectoris: Secondary | ICD-10-CM | POA: Diagnosis not present

## 2019-02-21 DIAGNOSIS — I739 Peripheral vascular disease, unspecified: Secondary | ICD-10-CM

## 2019-02-21 DIAGNOSIS — E785 Hyperlipidemia, unspecified: Secondary | ICD-10-CM | POA: Diagnosis not present

## 2019-02-21 NOTE — Progress Notes (Signed)
Virtual Visit via Telephone Note   This visit type was conducted due to national recommendations for restrictions regarding the COVID-19 Pandemic (e.g. social distancing) in an effort to limit this patient's exposure and mitigate transmission in our community.  Due to his co-morbid illnesses, this patient is at least at moderate risk for complications without adequate follow up.  This format is felt to be most appropriate for this patient at this time.  The patient did not have access to video technology/had technical difficulties with video requiring transitioning to audio format only (telephone).  All issues noted in this document were discussed and addressed.  No physical exam could be performed with this format.  Please refer to the patient's chart for his  consent to telehealth for [name].   Date:  02/21/2019   ID:  [name], [name] Aug 02, 1945, MRN [id number]  Patient Location: Home Provider Location: Home  PCP:  Garwin Brothers, MD  Cardiologist:  Shelva Majestic, MD  Electrophysiologist:  Constance Haw, MD   Evaluation Performed:  Follow-Up Visit  Chief Complaint:    History of Present Illness:    [name] is a 73 y.o. male who has an extensive cardiac and peripheral vascular history. He underwent initial CABG revascularization surgery in [place] in 1984. In 1996 he required re-do CABG revascularization surgery after all grafts were found to be occluded. At that time he also underwent right carotid endarterectomy. In 1998 he underwent stenting of his left main coronary artery and also had stenting done to the vein graft supplying the marginal vessel. He has history of right renal artery stenosis and underwent initial stenting with predilatation 2005. He has documented chronic right bundle branch block. He also significant peripheral vascular disease with bilateral SFA occlusion. He has known left subclavian artery occlusion with right subclavian stenosis.  Has a history of hypertension, hyperlipidemia, and remotely smoked cigarettes. His last catheterization was done in November 2012 which showed significant native CAD with evidence for patent left main stent. He had an occluded circumflex marginal vessel from the AV groove circumflex. There was 80% ostial RCA stenosis with 90% stenosis in the mid right coronary artery proximal to the RV marginal branch and total occlusion of the mid RCA. An occluded vein graft supplying the right coronary artery but evidence for collateralization to the distal right artery the left coronary injection and injection of the grafts. The vein supplying the marginal vessel with the previously placed ostial proximal Taxus stent had in-stent narrowing of 50% followed by 95% stenosis in the body of the proximal third of the vessel graft prior to the previously placed mid grafts 3.0x20 mm Taxus stent. At that time, he underwent difficult but successful intervention involving long segmental stenosis of the vein graft supplying the circumflex marginal vessel. Additional tandem 3.0x38 mm and 3.0x8 mm Taxus stents were inserted with excellent angiographic result.   He saw Dr. Rollene Fare in August 2014 and due to concern for progressive carotid stenoses repeat Doppler studies were done and he also saw Dr. Sherren Mocha Early for followup evaluation. Repeat imaging showed a patent right carotid endarterectomy site the Doppler velocity suggestive of 40  percent stenosis of the bilateral proximal internal carotid arteries. There was left subclavian steal noted with retrograde left vertebral artery flow, monophasic left subclavian artery flow and significant difference in the bilateral brachial pressures.  He established cardiology care with me in December 2014.  A nuclear perfusion study in May 2015 demonstrated mild inferobasal ischemia,  most likely secondary to RCA distribution.    On 03/25/2014 he was seen by Dr. Martinique after he developed an episode  of atrial fibrillation the day before.  An ECG in Dr. Latina Craver  office showed atrial fibrillation with rate of 120.  His atenolol was increased to 50 mg twice a day, and he converted to sinus rhythm after 18 hours.  When he saw Dr. Martinique the following day he was in sinus rhythm.  On the increased dose of beta blocker he denies recurrent palpitations.  He tells me he recently underwent a colonoscopy and had 3 polyps removed, but also required cauterization for another site that was oozing blood.  This reason, he was not restarted on anticoagulation but has been maintained on low-dose aspirin and Plavix.  He has seen Dr. Gwenlyn Found in follow-up of his PVD and is status post left renal stenting.  He also is followed by Dr. Donnetta Hutching for his carotid disease.  In August 2017 he was seen by Kerin Ransom after he was found to be in atrial fibrillation 1 week previously at cardiac rehabilitation.  He was started on amiodarone 200 mg twice a day for 1 week and then 200 mg daily and his atenolol was reduced to 25 mg twice a day.  He was seen several weeks later on 10/29/2015 by Lesia Hausen and was back in sinus rhythm.  He underwent carotid duplex imaging on 12/23/2015.  The right carotid endarterectomy site had Doppler velocities suggestive of a 60-79% stenosis. The left internal carotid artery velocity suggested a 40-59% stenoses.  Renal duplex imaging to just a patent right renal artery stent.  There was stable greater than 60% left renal artery stenosis.    When I  saw him in November 2017 he was maintaining sinus rhythm and  since he was on amiodarone, I recommended he reduce lovastatin to 20 mg and added Zetia for continued aggressive lipid management.  He had progressive lower extremity edema and recommended he increase his torsemide.  In March 2018 he developed episodes of bronchitis.  Since that time his rhythm has been irregular.  He wore a Holter monitor which showed atrial fibrillation with a minimum rate at 52 at  4:02 AM was sleeping and maximum rate at 119.  In the afternoon with an average rate of 82 bpm.  There are frequent PVCs, rare couplets, occasional episodes of bigeminy and trigeminy but no episodes of VT.  There were no pauses.  Previously, his blood pressure had become low and hehad been on losartan and I saw him one month ago there was leg swelling, left greater than right.  He had a chest x-ray with Dr. Reesa Chew on 06/21/2016.  A  follow-up carotid Doppler study in April 2018 showed improvement in his velocities such that his right carotid endarterectomy was patent with velocities now suggestive of 40-59% stenosis.  The left internal carotid velocities suggested a 1-39% stenosis.   When I saw him in May 2018, he was in atrial fibrillation.  I discontinued Plavix and started him on eliquis 5 mg twice a day.  I recommended that he not to take aspirin with his GI history.  I increased amiodarone to 200 mg twice a day and reduced his Ranexa dose to 500 mg twice a day with concomitant therapy.  He was having lower extremity edema.  A Doppler study was negative for DVT by his primary physician.  I recommended support stockings.    I saw him 1 month later and  he continued to be in atrial fibrillation.  At that time, we discussed potential DC cardioversion for restoration of sinus rhythm.  He underwent repeat echo on 08/24/2016 which showed an EF of 55-60%.  His left atrium was normal in size and his right atrium was mildly dilated.  PA pressure was increased at 48 mm.  There was moderate LVH. Marland Kitchen  He apparently was seen subsequent to that evaluation by Almyra Deforest and was having 2+ bilateral lower extremity edema.  Further discussion concerning cardioversion was undertaken but the patient initially deferred at that point and wanted to that it further.  He tells me 1 month ago he noticed some bright red blood per rectum.  As result, he self reduced his eliquis from 5 mg twice a day down to 5 mg daily.  Remotely, he has  seen Dr. Scarlette Shorts in the past.  His last colonoscopy was at least 5 years ago.  He had stopped taking amiodarone since he felt to contribute to fatigue, tremors, dizziness, and gait staggering.  The symptomshave improved off amiodarone antiarrhythmic therapy.  Her, he has noticed that at times his pulse can increase up to the low 100s with minimal activity.  He has now been using compression stockings for his ankle edema which has improved.    When I saw him in November 2018 , I recommended eliquis 5 mg twice a day for full anticoagulation.  I started him on metoprolol 50 mg twice a day  I discussed options concerning continuing permanent AF versus possible Tikosyn or AF ablation and referred him for  EP evaluation.  He saw Dr. Curt Bears in January 2019. Due to his QTC prolongation  he was not felt to be a candidate for Tikosyn or sotalol a and also was felt to be high risk for ablation.  As result he recommended reiitiation of low-dose amiodarone.n 04/12/2017.  He underwent successful cardioversion.  When he saw Kerin Ransom on 04/19/2017.  At that time, the patient had complaints of increasing dyspnea on exertion and orthopnea, which were similar to his previous symptoms on amiodarone.  When he last saw Shanon Brow a on May 02, 2017 he also complained of a fine tremor since institution of amiodarone, and amiodarone  was reduced to 100 mg every other day.   He underwent carotid duplex imaging which showed mild bilateral carotid plaque 40-59% on the right and 1-39% in the left.  In addition, an abdominal renal duplex study showed patent right renal artery stent, moderate left renal artery stenosis, and significant SMA stenosis.  This was reviewed by Dr. Gwenlyn Found.  When seen in March 2019 he felt his tremor and loss of coordination on the higher dose of amiodarone  improved with the lower dose amiodarone.  He was not been taking Demadex for the past 10 days and this has resulted in increasing leg swelling.   Subsequently, he was seen in the atrial fibrillation clinic by Orson Eva several occasions, last seen Jul 14, 2017.  Prior to that evaluation he had seen his PCP for intermittent fevers.  When last seen in A. fib clinic he was in sinus rhythm. He denies any recurrent symptoms.  He continues to be on amiodarone 100 mg every other day and Eliquis for anticoagulation.  He is on metoprolol 75 mg twice a day, Ranexa 500 mg twice a day and his dose was reduced when amiodarone was instituted.  He is on lovastatin 40 mg for hyperlipidemia.  He has been taking torsemide  on an as-needed basis.  He has a prescription for diltiazem 30 mg to take if his heart rate increases above 100 and long as blood pressure is greater than 100.    When I saw him in September 2019 he was back in atrial fibrillation with ventricular rate at 76 bpm.  His right bundle branch block was stable and there was evidence for left posterior hemiblock.  Had only been taking amiodarone 100 mg every other day and I suggested that he further titrate his back to 100 mg daily.  With this slightly higher dose he has noticed some slight increase in some balance and coordination issues.  He denies any anginal symptoms.  He has noted some leg swelling bilaterally.   Since I last saw him in November 2019, he has seen Dr. Curt Bears in January 2020.  He was in atrial fibrillation and has not maintained sinus rhythm after his cardioversion.  At that time, he recommended discontinuance of amiodarone and that he continue in permanent atrial fibrillation.  At the end of February 2020 he developed increasing chest congestion, went to urgent care and was treated with antibiotics for 10 days.  Reportedly a chest x-ray was negative.  He has continued to have chest congestion.    He fractured his right hip in April 2020 and spent 4 days at [facility].  In May he underwent PV intervention by Dr. Ruta Hinds with stenting of his celiac artery and superior  mesenteric artery due to high-grade stenoses and evidence for chronic mesenteric ischemia.  He developed a pseudoaneurysm which was successfully compressed on Jul 27, 2018.  He saw Dr. Gwenlyn Found in June 2020 for progressive lower extremity edema who doubled his torsemide to twice a day.  Subsequently he apparently was given a prescription for metolazone by Dr. Reesa Chew to take on Monday Wednesday and Friday.   I last saw him in June 2020 at which time his blood pressure was stable but he had been experiencing progressive lower extremity edema necessitating doubling of his torsemide dose to 20 twice a day and initiation of metolazone by Dr. Reesa Chew.  He was not having any anginal symptoms.  He was last evaluated by Dr. Gwenlyn Found yesterday in a telemedicine visit for his peripheral vascular disease including his renal artery stenosis, and known bilateral SFA occlusions.  He was not having any lifestyle limiting claudication.  Blood pressure and renal function had remained stable.  He had moderate right and mild to moderate left internal carotid stenoses with plan noninvasive annual monitoring.  Presently, he has been without chest pain.  His atrial fibrillation rate has been controlled.  His leg swelling has improved and is better but at times there is still some mild lower extremity swelling.  He has been taking torsemide 2 tablets twice a day.  He has not required use of metolazone in over 3 weeks.  He will be undergoing laboratory at the end of December at the [name] who has been following his hemoglobin closely.  He had required a transfusion at [facility] in late summer.  He presents for evaluation.   The patient does not have symptoms concerning for COVID-19 infection (fever, chills, cough, or new shortness of breath).    Past Medical History:  Diagnosis Date   Anemia    Angina    Atrial fibrillation ([place])    Blood transfusion    Bronchitis    Bulging discs    "8 of them; thoracic,  lumbar, sacral area"  Chronic back pain    Colon polyps    Complication of anesthesia    Coronary artery disease 01/24/11   Successful PCI long segmental stensois  vein graft to the CX marginal vessel-graft previously stented prox. 3.0x2mm TAXUS stent mid seg 3.0x23mm TAXUS stent now tandem stens of 3.0x57mm & 3.0x12mm placed with the seg. 50,80 & 99% stenosis reduced to 0%   DDD (degenerative disc disease)    Diastolic heart failure ([place])    Dysrhythmia    "PAC's and PVC's"   Esophageal dilatation    "have had it done 7 times; last time ~ 2001"   Esophageal stricture    GERD (gastroesophageal reflux disease)    Headache(784.0)    Hiatal hernia    Hyperlipidemia    Myocardial infarction [facility]) 1991   Myocardial infarction ([place]) 2005   "had 3 heart attacks this year"   Paroxysmal atrial fibrillation ([place])    Pyloric stenosis    RBBB    Renal artery stenosis ([facility])    hhistory of right renal artery stenting and re-intervention for "in-stent restenosis.   S/P CABG (coronary artery bypass graft) [place]   Shortness of breath    "when I had the heart attacks"   Subclavian artery stenosis, left ([place])    Ulcer    "small; Dr. Henrene Pastor found it 02/2010"   Past Surgical History:  Procedure Laterality Date   ADENOIDECTOMY     "when I was an infant"   [place] N/A 10/14/2018   Procedure: BALLOON DILATION;  Surgeon: Lavena Bullion, DO;  Location: [place];  Service: Gastroenterology;  Laterality: N/A;   BIOPSY  07/26/2018   Procedure: BIOPSY;  Surgeon: Thornton Park, MD;  Location: [place];  Service: Gastroenterology;;   BIOPSY  10/14/2018   Procedure: BIOPSY;  Surgeon: Lavena Bullion, DO;  Location: [place];  Service: Gastroenterology;;   CARDIOVERSION N/A 04/17/2017   Procedure: CARDIOVERSION;  Surgeon: Jerline Pain, MD;  Location: [place];  Service:  Cardiovascular;  Laterality: N/A;   CAROTID ENDARTERECTOMY Right 1996   CE   COLONOSCOPY WITH PROPOFOL N/A 10/14/2018   Procedure: COLONOSCOPY WITH PROPOFOL;  Surgeon: Lavena Bullion, DO;  Location: [place];  Service: Gastroenterology;  Laterality: N/A;   CORONARY ANGIOPLASTY WITH STENT PLACEMENT  01/24/11   "today makes a total of 9 stents"   CORONARY ARTERY BYPASS GRAFT  1984   CABG X 4   CORONARY ARTERY BYPASS GRAFT  1996   CABG X 4   ESOPHAGOGASTRODUODENOSCOPY (EGD) WITH PROPOFOL N/A 07/26/2018   Procedure: ESOPHAGOGASTRODUODENOSCOPY (EGD) WITH PROPOFOL;  Surgeon: Thornton Park, MD;  Location: [place];  Service: Gastroenterology;  Laterality: N/A;   ESOPHAGOGASTRODUODENOSCOPY (EGD) WITH PROPOFOL N/A 10/14/2018   Procedure: ESOPHAGOGASTRODUODENOSCOPY (EGD) WITH PROPOFOL;  Surgeon: Lavena Bullion, DO;  Location: [place];  Service: Gastroenterology;  Laterality: N/A;   IR THORACENTESIS ASP PLEURAL SPACE W/IMG GUIDE  10/12/2018   LEFT HEART CATHETERIZATION WITH CORONARY/GRAFT ANGIOGRAM N/A 01/24/2011   Procedure: LEFT HEART CATHETERIZATION WITH Beatrix Fetters;  Surgeon: Troy Sine, MD;  Location: [facility] CATH LAB;  Service: Cardiovascular;  Laterality: N/A;   PERIPHERAL VASCULAR INTERVENTION  07/16/2018   Procedure: PERIPHERAL VASCULAR INTERVENTION;  Surgeon: Elam Dutch, MD;  Location: [place] CV LAB;  Service: Cardiovascular;;  SMA and Celiac   pylondial cyst removal     RETINAL DETACHMENT SURGERY  04/2007   right  TONSILLECTOMY  1972   VISCERAL ANGIOGRAPHY N/A 07/16/2018   Procedure: VISCERAL ANGIOGRAPHY;  Surgeon: Elam Dutch, MD;  Location: [place] CV LAB;  Service: Cardiovascular;  Laterality: N/A;     Current Meds  Medication Sig   butalbital-aspirin-caffeine (FIORINAL) 50-325-40 MG capsule Take 1 capsule by mouth daily.   calcium carbonate (TUMS - DOSED IN MG ELEMENTAL CALCIUM) 500 MG chewable tablet Chew 1 tablet  (200 mg of elemental calcium total) by mouth 3 (three) times daily as needed for indigestion or heartburn.   diltiazem (CARDIZEM CD) 120 MG 24 hr capsule TAKE 1 CAPSULE BY MOUTH EVERY DAY   dutasteride (AVODART) 0.5 MG capsule Take 0.5 mg by mouth daily.    ezetimibe (ZETIA) 10 MG tablet TAKE 1 TABLET (10 MG TOTAL) BY MOUTH DAILY.   finasteride (PROSCAR) 5 MG tablet Take 5 mg by mouth daily.   Gabapentin Enacarbil (HORIZANT) 600 MG TBCR Take by mouth at bedtime.   Ketotifen Fumarate (ALAWAY OP) Place 1 drop into the right eye daily. Uses before putting contact Lens in R eye   KLOR-CON M20 20 MEQ tablet Take 20 mEq by mouth daily.   loratadine (CLARITIN) 10 MG tablet Take 10 mg by mouth daily as needed for allergies, rhinitis or itching.    lovastatin (MEVACOR) 40 MG tablet TAKE 1 TABLET BY MOUTH AT BEDTIME   metolazone (ZAROXOLYN) 2.5 MG tablet Take 2.5 mg by mouth 2 (two) times a week. Monday, Friday   Multiple Vitamins-Minerals (MULTIVITAMINS THER. W/MINERALS) TABS Take 1 tablet by mouth daily.     nitroGLYCERIN (NITROLINGUAL) 0.4 MG/SPRAY spray Place 1 spray under the tongue every 5 (five) minutes x 3 doses as needed for chest pain.   Potassium Chloride ER 20 MEQ TBCR Take 1 tablet by mouth daily.   ranolazine (RANEXA) 1000 MG SR tablet Take 500 mg by mouth 2 (two) times daily.   senna-docusate (SENOKOT-S) 8.6-50 MG tablet Take 1 tablet by mouth daily.   tamsulosin (FLOMAX) 0.4 MG CAPS capsule Take 0.4 mg by mouth daily.   torsemide (DEMADEX) 20 MG tablet Take 1 tablet (20 mg total) by mouth 2 (two) times daily. FOR SWELLING OR WEIGHT GAIN   vitamin E 400 UNIT capsule Take 400 Units by mouth daily.      Allergies:   Codeine, Sulfa antibiotics, and Sulfonamide derivatives   Social History   Tobacco Use   Smoking status: Former Smoker    Packs/day: 1.00    Years: 38.00    Pack years: 38.00    Types: Cigarettes    Quit date: 02/09/2005    Years since quitting: 14.0     Smokeless tobacco: Never Used   Tobacco comment: "quit smoking cigarrettes 2006"  Substance Use Topics   Alcohol use: No    Alcohol/week: 0.0 standard drinks   Drug use: No     Family Hx: The patient's family history includes AAA (abdominal aortic aneurysm) in his father; Breast cancer in his paternal aunt; Diabetes in his sister; Heart attack in his father; Heart disease in his father; Heart failure in his father; Hypertension in his mother; Stroke in his mother; Ulcers in his father. There is no history of Colon cancer.  ROS:   Please see the history of present illness.    No fevers chills night sweats No cough No loss of taste or smell No wheezing Atrial fibrillation, chronic with rate control No bleeding, history of GERD Leg swelling improved but still mild No new neurologic symptoms  Positive for anemia Sleeping well All other systems reviewed and are negative.   Prior CV studies:   The following studies were reviewed today:  I reviewed the PV evaluation of Dr. Gwenlyn Found, and recent prior evaluations.  Labs/Other Tests and Data Reviewed:    EKG:  An ECG dated September 03, 2018 was personally reviewed today and demonstrated:  Atrial fibrillation at 96 bpm  Recent Labs: 07/23/2018: B Natriuretic Peptide 1,122.4 07/24/2018: TSH 0.857 10/10/2018: Magnesium 2.1 10/13/2018: Platelets 130 10/15/2018: Hemoglobin 8.2 12/07/2018: ALT 17; BUN 24; Creatinine, Ser 1.82; Potassium 4.6; Sodium 134   Recent Lipid Panel Lab Results  Component Value Date/Time   CHOL 143 05/25/2018 10:27 AM   TRIG 84 05/25/2018 10:27 AM   HDL 63 05/25/2018 10:27 AM   CHOLHDL 2.3 05/25/2018 10:27 AM   CHOLHDL 2.6 08/01/2014 08:52 AM   LDLCALC 63 05/25/2018 10:27 AM    Wt Readings from Last 3 Encounters:  02/21/19 131 lb (59.4 kg)  02/20/19 130 lb 8 oz (59.2 kg)  12/25/18 136 lb 3.2 oz (61.8 kg)     Objective:    Vital Signs:  BP 119/75    Pulse 82    Ht 5\' 5"  (1.651 m)    Wt 131 lb (59.4 kg)     BMI 21.80 kg/m    Since this was a telemedicine evaluation I could not physically examine the patient. Breathing was normal and not labored There was no wheezing His atrial fibrillation rate was in the 70s to 80s He denied chest discomfort to palpation Abdomen was nontender He states there was just mild residual lower leg swelling, significantly improved No new neurologic symptoms Normal affect and mood  ASSESSMENT & PLAN:    1. Permanent atrial fibrillation: Ventricular rate is well controlled on diltiazem CD 120 mg daily 2. CAD: Status post CABG revascularization surgery 1984, 1996, last PCI 2012: Currently no anginal symptomatology continues to be on Ranexa 500 mg twice a day 3. Lower extremity edema: Significantly improved, now which is mild edema on torsemide 40 mg twice a day.  He has not taken metolazone in over 3 weeks. 4. Hyperlipidemia with target LDL less than 70: Currently on Zetia and lovastatin. 5. PVD: Multiple vascular procedures including carotid endarterectomy, renal stenting, documented SFA occlusion, and mesenteric ischemia, status post stenting.  Followed by Drs. Gwenlyn Found and Fields. 6. Anemia: Followed by oncology, status post blood transfusion at [facility] several months ago.  Follow-up laboratory scheduled with oncology later this month.  COVID-19 Education: The signs and symptoms of COVID-19 were discussed with the patient and how to seek care for testing (follow up with PCP or arrange E-visit).  The importance of social distancing was discussed today.  Time:   Today, I have spent 20 minutes with the patient with telehealth technology discussing the above problems.     Medication Adjustments/Labs and Tests Ordered: Current medicines are reviewed at length with the patient today.  Concerns regarding medicines are outlined above.   Tests Ordered: No orders of the defined types were placed in this encounter.   Medication Changes: No orders of the  defined types were placed in this encounter.   Follow Up: 6 months in office visit  Signed, Shelva Majestic, MD  02/21/2019 8:29 AM    [place]

## 2019-02-21 NOTE — Patient Instructions (Signed)
Medication Instructions:  Your physician recommends that you continue on your current medications as directed. Please refer to the Current Medication list given to you today.  *If you need a refill on your cardiac medications before your next appointment, please call your pharmacy*  Lab Work: Please have lab work done in 2 weeks with your oncologist.  Follow-Up: At [facility], you and your health needs are our priority.  As part of our continuing mission to provide you with exceptional heart care, we have created designated Provider Care Teams.  These Care Teams include your primary Cardiologist (physician) and Advanced Practice Providers (APPs -  Physician Assistants and Nurse Practitioners) who all work together to provide you with the care you need, when you need it.  Your next appointment:   6 month(s)  The format for your next appointment:   In Person  Provider:   You may see Shelva Majestic, MD or one of the following Advanced Practice Providers on your designated Care Team:    Almyra Deforest, PA-C  Fabian Sharp, PA-C or   Roby Lofts, [place]   Other Instructions Please call our office 2 months ahead of time to schedule your 6 month follow-up.

## 2019-02-22 ENCOUNTER — Encounter: Payer: Self-pay | Admitting: Cardiovascular Disease

## 2019-03-19 ENCOUNTER — Telehealth: Payer: Self-pay

## 2019-03-19 DIAGNOSIS — I739 Peripheral vascular disease, unspecified: Secondary | ICD-10-CM | POA: Diagnosis not present

## 2019-03-19 DIAGNOSIS — L03031 Cellulitis of right toe: Secondary | ICD-10-CM | POA: Diagnosis not present

## 2019-03-19 DIAGNOSIS — L03115 Cellulitis of right lower limb: Secondary | ICD-10-CM | POA: Diagnosis not present

## 2019-03-19 NOTE — Telephone Encounter (Signed)
Pt called after seeing Dr. Aviva Signs (podiatrist at [facility]) for an "infected toe". Dr. Gretta Arab suggested pt follow up with [place], as the top of his foot and toes were "red" and he was concerned about "blood flow". Scheduled pt for an appt Monday morning. I have spoken to [facility] at Dr. Gretta Arab office and requested his notes be faxed over for his upcoming appt.

## 2019-03-22 ENCOUNTER — Telehealth (HOSPITAL_COMMUNITY): Payer: Self-pay

## 2019-03-22 NOTE — Telephone Encounter (Signed)

## 2019-03-25 ENCOUNTER — Other Ambulatory Visit: Payer: Self-pay

## 2019-03-25 ENCOUNTER — Other Ambulatory Visit: Payer: Self-pay | Admitting: Cardiovascular Disease

## 2019-03-25 ENCOUNTER — Ambulatory Visit: Payer: Medicare PPO | Admitting: Physician Assistant

## 2019-03-25 VITALS — BP 154/68 | HR 93 | Temp 97.3°F | Resp 18 | Ht 65.0 in | Wt 130.0 lb

## 2019-03-25 DIAGNOSIS — K551 Chronic vascular disorders of intestine: Secondary | ICD-10-CM

## 2019-03-25 DIAGNOSIS — L03031 Cellulitis of right toe: Secondary | ICD-10-CM | POA: Diagnosis not present

## 2019-03-25 DIAGNOSIS — R600 Localized edema: Secondary | ICD-10-CM | POA: Diagnosis not present

## 2019-03-25 DIAGNOSIS — L03039 Cellulitis of unspecified toe: Secondary | ICD-10-CM | POA: Insufficient documentation

## 2019-03-25 NOTE — Progress Notes (Signed)
Established Patient   History of Present Illness   [name] is a 74 y.o. (11-02-1945) male who was referred back to our office by podiatrist Dr. Gean Quint to evaluate his right great toe after diagnosis of paronychia and removal of right great toenail.  He underwent celiac and superior mesenteric artery stenting by Dr. Oneida Alar in May 2020.  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  Surgical history also significant for CABG as well as right carotid endarterectomy and redo by Dr. Donnetta Hutching.  Toenail removal was 1 week ago and he was also prescribed 2 weeks of doxycycline at that time.  Patient states the redness of his dorsal foot and toe is much improved however his toe is still painful during ambulation.  He denies any purulent or other drainage from toe.  He denies any systemic infection signs or symptoms.  CTA performed in follow-up for mesenteric artery stenting incidentally demonstrated bilateral proximal SFA occlusions however we have no further arterial studies.  He denies any history of claudication however ambulates with a walker.  He has a extensive history of tobacco abuse however is no longer smoker.  He is on aspirin and Zetia daily.  He is no longer on Plavix which he was placed on for mesenteric stenting.    Patient denies any fear of food or post prandial pain.  He denies any further abdominal pain at this time.  The patient's PMH, PSH, SH, and FamHx were reviewed and are unchanged from prior visit.  Current Outpatient Medications  Medication Sig Dispense Refill  . butalbital-aspirin-caffeine (FIORINAL) 50-325-40 MG capsule Take 1 capsule by mouth daily.    . calcium carbonate (TUMS - DOSED IN MG ELEMENTAL CALCIUM) 500 MG chewable tablet Chew 1 tablet (200 mg of elemental calcium total) by mouth 3 (three) times daily as needed for indigestion or heartburn. 30 tablet 0  . diltiazem (CARDIZEM CD) 120 MG 24 hr capsule TAKE  1 CAPSULE BY MOUTH EVERY DAY 90 capsule 1  . doxycycline (VIBRA-TABS) 100 MG tablet Take 100 mg by mouth.    . dutasteride (AVODART) 0.5 MG capsule Take 0.5 mg by mouth daily.     Marland Kitchen ezetimibe (ZETIA) 10 MG tablet TAKE 1 TABLET (10 MG TOTAL) BY MOUTH DAILY. 90 tablet 3  . finasteride (PROSCAR) 5 MG tablet Take 5 mg by mouth daily.    . Gabapentin Enacarbil (HORIZANT) 600 MG TBCR Take by mouth at bedtime.    Marland Kitchen Ketotifen Fumarate (ALAWAY OP) Place 1 drop into the right eye daily. Uses before putting contact Lens in R eye    . KLOR-CON M20 20 MEQ tablet Take 20 mEq by mouth daily.    Marland Kitchen loratadine (CLARITIN) 10 MG tablet Take 10 mg by mouth daily as needed for allergies, rhinitis or itching.   3  . lovastatin (MEVACOR) 40 MG tablet TAKE 1 TABLET BY MOUTH AT BEDTIME 90 tablet 3  . metolazone (ZAROXOLYN) 2.5 MG tablet Take 2.5 mg by mouth 2 (two) times a week. Monday, Friday    . Multiple Vitamins-Minerals (MULTIVITAMINS THER. W/MINERALS) TABS Take 1 tablet by mouth daily.      . nitroGLYCERIN (NITROLINGUAL) 0.4 MG/SPRAY spray Place 1 spray under the tongue every 5 (five) minutes x 3 doses as needed for chest pain. 4.9 g 2  . Potassium Chloride ER 20 MEQ TBCR Take 1 tablet by mouth daily.    . ranolazine (RANEXA) 1000 MG SR tablet TAKE 1 TABLET  BY MOUTH TWICE DAILY 180 tablet 3  . senna-docusate (SENOKOT-S) 8.6-50 MG tablet Take 1 tablet by mouth daily. 30 tablet 6  . tamsulosin (FLOMAX) 0.4 MG CAPS capsule Take 0.4 mg by mouth daily.    Marland Kitchen torsemide (DEMADEX) 20 MG tablet Take 1 tablet (20 mg total) by mouth 2 (two) times daily. FOR SWELLING OR WEIGHT GAIN 180 tablet 3  . vitamin E 400 UNIT capsule Take 400 Units by mouth daily.     Marland Kitchen esomeprazole (NEXIUM) 40 MG capsule Take 1 capsule (40 mg total) by mouth 2 (two) times daily before a meal. 120 capsule 3  . sucralfate (CARAFATE) 1 GM/10ML suspension Take 10 mLs (1 g total) by mouth 4 (four) times daily -  with meals and at bedtime. 1200 mL 11   No  current facility-administered medications for this visit.    On ROS today: 10 system ROS is negative unless otherwise noted in HPI.   Physical Examination   Vitals:   03/25/19 0910  BP: (!) 154/68  Pulse: 93  Resp: 18  Temp: (!) 97.3 F (36.3 C)  TempSrc: Temporal  SpO2: 99%  Weight: 130 lb (59 kg)  Height: 5\' 5"  (1.651 m)   Body mass index is 21.63 kg/m.  General Alert, O x 3, WD, NAD  Pulmonary Sym exp, good B air movt  Cardiac irregular, no Murmurs, No rubs, No S3,S4  Vascular Vessel Right Left  Radial Palpable Palpable  Brachial Palpable Palpable  Femoral Palpable Palpable  Popliteal Not palpable Not palpable  PT Not palpable Not palpable  DP Not palpable Not palpable    Gastro- intestinal soft, non-distended, non-tender to palpation,   Musculo- skeletal M/S 5/5 throughout  , Pitting edema bilateral lower extremities symmetrical to the level of the distal shin; small dry pretibial eschar without sign of infection; cracking skin noted in areas of swelling; right great toe with toenail removed low intensity erythema extending somewhat onto dorsal foot; no purulence or other drainage with manipulation of great toe; feet are symmetrically warm to touch Right AT and PT monophasic signals by Doppler Left DP and PT signals by Doppler  Neurologic Cranial nerves 2-12 intact , Pain and light touch intact in extremities , Motor exam as listed above     Medical Decision Making   [name] is a 73 y.o. male who presents to evaluate circulation of right lower extremity status post paronychia and right great toenail removal   Patent celiac and SMA stents by CTA abd/pelvis  Per patient and wife, appearance of right great toe is improved since toenail removal and being started on 2-week course of doxycycline last week  Incidental finding on CTA abdomen and pelvis with bilateral proximal SFA occlusions; no other arterial studies  We discussed proceeding with right  lower extremity arteriogram with possible intervention  Patient and wife are hesitant to proceed given his extensive recent hospitalization and concern for further hospital stay.  I discussed the importance of proceeding with an arteriogram to evaluate and potentially improve circulation to maximize potential for healing of right great toe.  I also discussed that this could be a limb threatening situation if there is underlying infection and not adequate circulation for hearing.  The patient and wife would prefer to wait until antibiotic course is completed and would like to allow more time to see if right great toe can heal on its own.  Based on physical exam, I think it is reasonable for patient to follow-up  in short order for recheck of toe.  We discussed if erythema worsens, drainage develops, or any portion of exam changes he will call the office to schedule right lower extremity arteriogram.  If we do not hear from the patient he is already scheduled for a follow up appointment with Dr. Oneida Alar on  February 4th.   Dagoberto Ligas PA-C [facility] Office: [phone]  Clinic MD: Trula Slade

## 2019-04-01 DIAGNOSIS — I5020 Unspecified systolic (congestive) heart failure: Secondary | ICD-10-CM | POA: Diagnosis not present

## 2019-04-02 DIAGNOSIS — I739 Peripheral vascular disease, unspecified: Secondary | ICD-10-CM | POA: Diagnosis not present

## 2019-04-02 DIAGNOSIS — I998 Other disorder of circulatory system: Secondary | ICD-10-CM | POA: Insufficient documentation

## 2019-04-02 DIAGNOSIS — L03031 Cellulitis of right toe: Secondary | ICD-10-CM | POA: Diagnosis not present

## 2019-04-02 DIAGNOSIS — M79671 Pain in right foot: Secondary | ICD-10-CM | POA: Diagnosis not present

## 2019-04-05 DIAGNOSIS — D649 Anemia, unspecified: Secondary | ICD-10-CM | POA: Diagnosis not present

## 2019-04-09 DIAGNOSIS — I739 Peripheral vascular disease, unspecified: Secondary | ICD-10-CM | POA: Diagnosis not present

## 2019-04-09 DIAGNOSIS — I998 Other disorder of circulatory system: Secondary | ICD-10-CM | POA: Diagnosis not present

## 2019-04-09 DIAGNOSIS — L03031 Cellulitis of right toe: Secondary | ICD-10-CM | POA: Diagnosis not present

## 2019-04-09 DIAGNOSIS — M79671 Pain in right foot: Secondary | ICD-10-CM | POA: Diagnosis not present

## 2019-04-09 DIAGNOSIS — R234 Changes in skin texture: Secondary | ICD-10-CM | POA: Diagnosis not present

## 2019-04-10 DIAGNOSIS — H353131 Nonexudative age-related macular degeneration, bilateral, early dry stage: Secondary | ICD-10-CM | POA: Diagnosis not present

## 2019-04-10 DIAGNOSIS — H01009 Unspecified blepharitis unspecified eye, unspecified eyelid: Secondary | ICD-10-CM | POA: Diagnosis not present

## 2019-04-10 DIAGNOSIS — H5211 Myopia, right eye: Secondary | ICD-10-CM | POA: Diagnosis not present

## 2019-04-10 DIAGNOSIS — H25813 Combined forms of age-related cataract, bilateral: Secondary | ICD-10-CM | POA: Diagnosis not present

## 2019-04-11 ENCOUNTER — Ambulatory Visit: Payer: Medicare PPO | Admitting: Vascular Surgery

## 2019-04-11 ENCOUNTER — Other Ambulatory Visit: Payer: Self-pay

## 2019-04-11 ENCOUNTER — Ambulatory Visit (HOSPITAL_COMMUNITY)
Admission: RE | Admit: 2019-04-11 | Discharge: 2019-04-11 | Disposition: A | Discharge status: Home / Self Care | Payer: Medicare PPO | Source: Ambulatory Visit | Attending: Vascular Surgery | Admitting: Vascular Surgery

## 2019-04-11 ENCOUNTER — Encounter: Payer: Self-pay | Admitting: Vascular Surgery

## 2019-04-11 VITALS — BP 138/78 | HR 89 | Temp 97.9°F | Resp 20 | Ht 65.0 in | Wt 139.0 lb

## 2019-04-11 DIAGNOSIS — I739 Peripheral vascular disease, unspecified: Secondary | ICD-10-CM | POA: Insufficient documentation

## 2019-04-11 MED ORDER — ASPIRIN EC 81 MG PO TBEC: 81.0000 mg | DELAYED_RELEASE_TABLET | Freq: Every day | ORAL | 2 refills | Status: AC

## 2019-04-11 NOTE — Progress Notes (Signed)
Patient is a 74 year old male who returns for follow-up today.  He recently had removal of his right first toenail.  He recently stopped smoking.  He was last seen by our PA Arlee Muslim on March 25, 2019.  At that time he had patent celiac and SMA stents.  He returns today for further follow-up regarding his toe.  He continues to take 81 mg of aspirin daily.  He is also on a statin.  He complains of pain in the left first toe and adjacent second toe.  He states the drainage from his left first toe has now ceased.  He is currently on Keflex for antibiotic coverage.  Review of systems: He is not gaining or losing weight.  However, he does say that food really has no taste he has shortness of breath with minimal exertion.  He has no chest pain.  He has no abdominal pain.  He has no postprandial abdominal pain.   He previously had undergone a celiac and superior mesenteric artery stenting in May 2020.  He has also previously undergone a right carotid endarterectomy and redo carotid endarterectomy by Dr. Donnetta Hutching.  He recently stopped smoking.   Physical exam:  Vitals:   04/11/19 0916  BP: 138/78  Pulse: 89  Resp: 20  Temp: 97.9 F (36.6 C)  SpO2: 100%  Weight: 139 lb (63 kg)  Height: 5\' 5"  (1.651 m)    Extremities: Right side absent femoral pulse absent pedal pulses, left side 2+ left femoral pulse absent popliteal and pedal pulses  Abdomen: Soft nontender nondistended no mass, large right inguinal hernia which I was able to reduce, left inguinal hernia smaller not seen today on exam  Skin: Right first toe nailbed no obvious drainage or significant erythema  Data:  Patient had bilateral ABIs performed today which I reviewed and interpreted.  Right side was 0.29 left side was 0.70  Assessment: Patient with nonhealing wound right foot still with pain in the first and second toes.  I believe most of this is probably related to his peripheral arterial disease.  I am concerned that he could be  at risk of limb loss if the wound in his right foot gets out of hand.  Plan: Patient is scheduled for aortogram lower extremity runoff possible intervention 04/26/2019.  Risk benefits possible complications and procedure details were discussed with the patient today include not limited to bleeding infection vessel injury possibility that he would require a bypass operation.  All of this was discussed with the patient and his wife.  They understand and agree to proceed.  Ruta Hinds, MD [facility] Office: [phone]

## 2019-04-11 NOTE — H&P (View-Only) (Signed)
Patient is a 74 year old male who returns for follow-up today.  He recently had removal of his right first toenail.  He recently stopped smoking.  He was last seen by our PA Arlee Muslim on March 25, 2019.  At that time he had patent celiac and SMA stents.  He returns today for further follow-up regarding his toe.  He continues to take 81 mg of aspirin daily.  He is also on a statin.  He complains of pain in the left first toe and adjacent second toe.  He states the drainage from his left first toe has now ceased.  He is currently on Keflex for antibiotic coverage.  Review of systems: He is not gaining or losing weight.  However, he does say that food really has no taste he has shortness of breath with minimal exertion.  He has no chest pain.  He has no abdominal pain.  He has no postprandial abdominal pain.   He previously had undergone a celiac and superior mesenteric artery stenting in May 2020.  He has also previously undergone a right carotid endarterectomy and redo carotid endarterectomy by Dr. Donnetta Hutching.  He recently stopped smoking.   Physical exam:  Vitals:   04/11/19 0916  BP: 138/78  Pulse: 89  Resp: 20  Temp: 97.9 F (36.6 C)  SpO2: 100%  Weight: 139 lb (63 kg)  Height: 5\' 5"  (1.651 m)    Extremities: Right side absent femoral pulse absent pedal pulses, left side 2+ left femoral pulse absent popliteal and pedal pulses  Abdomen: Soft nontender nondistended no mass, large right inguinal hernia which I was able to reduce, left inguinal hernia smaller not seen today on exam  Skin: Right first toe nailbed no obvious drainage or significant erythema  Data:  Patient had bilateral ABIs performed today which I reviewed and interpreted.  Right side was 0.29 left side was 0.70  Assessment: Patient with nonhealing wound right foot still with pain in the first and second toes.  I believe most of this is probably related to his peripheral arterial disease.  I am concerned that he could be  at risk of limb loss if the wound in his right foot gets out of hand.  Plan: Patient is scheduled for aortogram lower extremity runoff possible intervention 04/26/2019.  Risk benefits possible complications and procedure details were discussed with the patient today include not limited to bleeding infection vessel injury possibility that he would require a bypass operation.  All of this was discussed with the patient and his wife.  They understand and agree to proceed.  Ruta Hinds, MD [facility] Office: [phone]

## 2019-04-15 DIAGNOSIS — E782 Mixed hyperlipidemia: Secondary | ICD-10-CM | POA: Diagnosis not present

## 2019-04-15 DIAGNOSIS — I25700 Atherosclerosis of coronary artery bypass graft(s), unspecified, with unstable angina pectoris: Secondary | ICD-10-CM | POA: Diagnosis not present

## 2019-04-15 DIAGNOSIS — I739 Peripheral vascular disease, unspecified: Secondary | ICD-10-CM | POA: Diagnosis not present

## 2019-04-24 ENCOUNTER — Other Ambulatory Visit (HOSPITAL_COMMUNITY)
Admission: RE | Admit: 2019-04-24 | Discharge: 2019-04-24 | Disposition: A | Discharge status: Home / Self Care | Payer: Medicare PPO | Source: Ambulatory Visit | Attending: Vascular Surgery | Admitting: Vascular Surgery

## 2019-04-24 DIAGNOSIS — Z20822 Contact with and (suspected) exposure to covid-19: Secondary | ICD-10-CM | POA: Insufficient documentation

## 2019-04-24 DIAGNOSIS — Z01812 Encounter for preprocedural laboratory examination: Secondary | ICD-10-CM | POA: Insufficient documentation

## 2019-04-24 LAB — SARS CORONAVIRUS 2 (TAT 6-24 HRS): SARS Coronavirus 2: NEGATIVE

## 2019-04-26 ENCOUNTER — Ambulatory Visit (HOSPITAL_COMMUNITY): Admission: RE | Admit: 2019-04-26 | Payer: Medicare PPO | Source: Home / Self Care | Admitting: Vascular Surgery

## 2019-04-26 ENCOUNTER — Encounter (HOSPITAL_COMMUNITY): Admission: RE | Payer: Self-pay | Source: Home / Self Care

## 2019-04-26 SURGERY — ABDOMINAL AORTOGRAM W/LOWER EXTREMITY
Anesthesia: LOCAL

## 2019-04-30 DIAGNOSIS — L03031 Cellulitis of right toe: Secondary | ICD-10-CM | POA: Diagnosis not present

## 2019-04-30 DIAGNOSIS — M79671 Pain in right foot: Secondary | ICD-10-CM | POA: Diagnosis not present

## 2019-04-30 DIAGNOSIS — I998 Other disorder of circulatory system: Secondary | ICD-10-CM | POA: Diagnosis not present

## 2019-04-30 DIAGNOSIS — I739 Peripheral vascular disease, unspecified: Secondary | ICD-10-CM | POA: Diagnosis not present

## 2019-05-01 ENCOUNTER — Encounter: Payer: Self-pay | Admitting: Internal Medicine

## 2019-05-02 DIAGNOSIS — I5020 Unspecified systolic (congestive) heart failure: Secondary | ICD-10-CM | POA: Diagnosis not present

## 2019-05-07 DIAGNOSIS — D649 Anemia, unspecified: Secondary | ICD-10-CM | POA: Diagnosis not present

## 2019-05-07 DIAGNOSIS — N189 Chronic kidney disease, unspecified: Secondary | ICD-10-CM | POA: Diagnosis not present

## 2019-05-07 DIAGNOSIS — D631 Anemia in chronic kidney disease: Secondary | ICD-10-CM | POA: Diagnosis not present

## 2019-05-08 ENCOUNTER — Other Ambulatory Visit (HOSPITAL_COMMUNITY)
Admission: RE | Admit: 2019-05-08 | Discharge: 2019-05-08 | Disposition: A | Discharge status: Home / Self Care | Payer: Medicare PPO | Source: Ambulatory Visit | Attending: Vascular Surgery | Admitting: Vascular Surgery

## 2019-05-08 DIAGNOSIS — Z01812 Encounter for preprocedural laboratory examination: Secondary | ICD-10-CM | POA: Insufficient documentation

## 2019-05-08 DIAGNOSIS — Z20822 Contact with and (suspected) exposure to covid-19: Secondary | ICD-10-CM | POA: Diagnosis not present

## 2019-05-08 LAB — SARS CORONAVIRUS 2 (TAT 6-24 HRS): SARS Coronavirus 2: NEGATIVE

## 2019-05-10 ENCOUNTER — Ambulatory Visit (HOSPITAL_COMMUNITY)
Admission: RE | Admit: 2019-05-10 | Discharge: 2019-05-10 | Disposition: A | Discharge status: Home / Self Care | Payer: Medicare PPO | Attending: Vascular Surgery | Admitting: Vascular Surgery

## 2019-05-10 ENCOUNTER — Encounter (HOSPITAL_COMMUNITY)
Admission: RE | Disposition: A | Discharge status: Home / Self Care | Payer: Self-pay | Source: Home / Self Care | Attending: Vascular Surgery

## 2019-05-10 ENCOUNTER — Other Ambulatory Visit: Payer: Self-pay

## 2019-05-10 DIAGNOSIS — I70235 Atherosclerosis of native arteries of right leg with ulceration of other part of foot: Secondary | ICD-10-CM | POA: Diagnosis not present

## 2019-05-10 DIAGNOSIS — Z87891 Personal history of nicotine dependence: Secondary | ICD-10-CM | POA: Insufficient documentation

## 2019-05-10 DIAGNOSIS — Z7982 Long term (current) use of aspirin: Secondary | ICD-10-CM | POA: Diagnosis not present

## 2019-05-10 DIAGNOSIS — L97519 Non-pressure chronic ulcer of other part of right foot with unspecified severity: Secondary | ICD-10-CM | POA: Diagnosis not present

## 2019-05-10 DIAGNOSIS — Z79899 Other long term (current) drug therapy: Secondary | ICD-10-CM | POA: Diagnosis not present

## 2019-05-10 HISTORY — PX: ABDOMINAL AORTOGRAM W/LOWER EXTREMITY: CATH118223

## 2019-05-10 LAB — POCT I-STAT, CHEM 8
BUN: 29 mg/dL — ABNORMAL HIGH (ref 8–23)
Calcium, Ion: 1.11 mmol/L — ABNORMAL LOW (ref 1.15–1.40)
Chloride: 104 mmol/L (ref 98–111)
Creatinine, Ser: 1.7 mg/dL — ABNORMAL HIGH (ref 0.61–1.24)
Glucose, Bld: 94 mg/dL (ref 70–99)
HCT: 27 % — ABNORMAL LOW (ref 39.0–52.0)
Hemoglobin: 9.2 g/dL — ABNORMAL LOW (ref 13.0–17.0)
Potassium: 3.5 mmol/L (ref 3.5–5.1)
Sodium: 139 mmol/L (ref 135–145)
TCO2: 28 mmol/L (ref 22–32)

## 2019-05-10 SURGERY — ABDOMINAL AORTOGRAM W/LOWER EXTREMITY
Anesthesia: LOCAL

## 2019-05-10 MED ORDER — HYDRALAZINE HCL 20 MG/ML IJ SOLN: 5.0000 mg | INTRAMUSCULAR | Status: DC | PRN

## 2019-05-10 MED ORDER — SODIUM CHLORIDE 0.9% FLUSH: 3.0000 mL | Freq: Two times a day (BID) | INTRAVENOUS | Status: DC

## 2019-05-10 MED ORDER — FENTANYL CITRATE (PF) 100 MCG/2ML IJ SOLN
INTRAMUSCULAR | Status: DC | PRN
  Administered 2019-05-10: 25 ug via INTRAVENOUS

## 2019-05-10 MED ORDER — SODIUM CHLORIDE 0.9 % IV SOLN: INTRAVENOUS | Status: AC

## 2019-05-10 MED ORDER — HEPARIN (PORCINE) IN NACL 1000-0.9 UT/500ML-% IV SOLN
INTRAVENOUS | Status: AC
  Filled 2019-05-10: qty 1000

## 2019-05-10 MED ORDER — LABETALOL HCL 5 MG/ML IV SOLN: 10.0000 mg | INTRAVENOUS | Status: DC | PRN

## 2019-05-10 MED ORDER — SODIUM CHLORIDE 0.9% FLUSH: 3.0000 mL | INTRAVENOUS | Status: DC | PRN

## 2019-05-10 MED ORDER — HEPARIN (PORCINE) IN NACL 1000-0.9 UT/500ML-% IV SOLN
INTRAVENOUS | Status: DC | PRN
  Administered 2019-05-10 (×2): 500 mL

## 2019-05-10 MED ORDER — LIDOCAINE HCL (PF) 1 % IJ SOLN
INTRAMUSCULAR | Status: AC
  Filled 2019-05-10: qty 30

## 2019-05-10 MED ORDER — SODIUM CHLORIDE 0.9 % IV SOLN: 250.0000 mL | INTRAVENOUS | Status: DC | PRN

## 2019-05-10 MED ORDER — SODIUM CHLORIDE 0.9 % IV SOLN: INTRAVENOUS | Status: DC

## 2019-05-10 MED ORDER — ACETAMINOPHEN 325 MG PO TABS: 650.0000 mg | ORAL_TABLET | ORAL | Status: DC | PRN

## 2019-05-10 MED ORDER — FENTANYL CITRATE (PF) 100 MCG/2ML IJ SOLN
INTRAMUSCULAR | Status: AC
  Filled 2019-05-10: qty 2

## 2019-05-10 MED ORDER — ONDANSETRON HCL 4 MG/2ML IJ SOLN: 4.0000 mg | Freq: Four times a day (QID) | INTRAMUSCULAR | Status: DC | PRN

## 2019-05-10 MED ORDER — MORPHINE SULFATE (PF) 2 MG/ML IV SOLN: 2.0000 mg | INTRAVENOUS | Status: DC | PRN

## 2019-05-10 MED ORDER — IODIXANOL 320 MG/ML IV SOLN
INTRAVENOUS | Status: DC | PRN
  Administered 2019-05-10: 127 mL via INTRA_ARTERIAL

## 2019-05-10 MED ORDER — OXYCODONE HCL 5 MG PO TABS: 5.0000 mg | ORAL_TABLET | ORAL | Status: DC | PRN

## 2019-05-10 MED ORDER — LIDOCAINE HCL (PF) 1 % IJ SOLN
INTRAMUSCULAR | Status: DC | PRN
  Administered 2019-05-10: 15 mL via INTRADERMAL

## 2019-05-10 SURGICAL SUPPLY — 9 items
CATH ANGIO 5F PIGTAIL 65CM (CATHETERS) ×1 IMPLANT
CATH STRAIGHT 5FR 65CM (CATHETERS) ×1 IMPLANT
KIT PV (KITS) ×2 IMPLANT
SHEATH PINNACLE 5F 10CM (SHEATH) ×1 IMPLANT
SHEATH PROBE COVER 6X72 (BAG) ×1 IMPLANT
SYR MEDRAD MARK V 150ML (SYRINGE) ×1 IMPLANT
TRANSDUCER W/STOPCOCK (MISCELLANEOUS) ×2 IMPLANT
TRAY PV CATH (CUSTOM PROCEDURE TRAY) ×2 IMPLANT
WIRE HITORQ VERSACORE ST 145CM (WIRE) ×1 IMPLANT

## 2019-05-10 NOTE — Progress Notes (Signed)
Site area: right groin  Site Prior to Removal:  Level 0  Pressure Applied For 15  MINUTES    Minutes Beginning at 0900  Manual:   Yes.    Patient Status During Pull:  Stable  Post Pull Groin Site:  Level 0  Post Pull Instructions Given:  Yes.    Post Pull Pulses Present:  Yes.    Dressing Applied:  Yes.    Comments:  Bed rest started at 0935 X 4 hr.

## 2019-05-10 NOTE — Op Note (Signed)
Procedure: Abdominal aortogram with bilateral lower extremity runoff  Preoperative diagnosis: Nonhealing wound right foot  Postoperative diagnosis: Same  Anesthesia: Local  Operative findings: 1.  Occlusion right external iliac artery  2.  Occlusion right common femoral artery profunda femoris and superficial femoral artery  3.  Occlusion left profunda femoris artery left superficial femoral artery  4.  Very diseased reconstitution right below-knee popliteal with peroneal runoff  5.  Reconstitution above-knee popliteal artery left leg with three-vessel runoff to the left foot  6.  In-stent restenosis right renal artery stent 50%  Operative details: After obtaining informed consent, the patient is taken the [facility] lab.  The patient was placed in supine position on the angio table.  Both groins were prepped and draped in usual sterile fashion.  Local anesthesia was infiltrated over the left common femoral artery.  Ultrasound was used to identify the left common femoral artery.  The left femoral bifurcation could not be easily identified presumably due to profunda occlusion.  An introducer needle was used to cannulate the left common femoral artery and an 035 versa core wire threaded up in the abdominal aorta under fluoroscopic guidance.  A 5 French sheath placed in the left common femoral artery.  This was thoroughly flushed with heparinized saline.  5 French pigtail catheter was then advanced up into the abdominal aorta under fluoroscopic guidance.  Abdominal aortogram was then obtained in AP projection and a 30 degree LAO projection to define the inflow.  The right renal artery has a 50% in-stent restenosis.  The infrarenal abdominal aorta is calcified but patent.  The right and left common iliac arteries are heavily calcified with irregular plaque without flow-limiting stenosis.  The right external iliac artery is occluded at its origin.  The right internal iliac artery is small but patent.  The  left internal iliac artery is small but patent.  The left external iliac artery has diffuse plaque but no obvious flow-limiting stenosis.  Next the pigtail catheter was pulled down above the aortic bifurcation and lower extremity runoff views were obtained.  In the right lower extremity, the right common femoral artery is occluded.  The right profunda is occluded.  There is a gluteal branch that eventually reconstitutes the patient's below-knee popliteal artery.  The above-knee popliteal artery is occluded.  The below-knee popliteal artery is severely diseased with multiple segments of stenosis.  The anterior tibial artery is patent over its proximal third but then occludes.  The posterior tibial artery is occluded.  There is one-vessel peroneal runoff to the right foot.  In the left lower extremity, the left external iliac artery had a pressure gradient measured across it.  This was done by removing the pigtail catheter over guidewire and replacing it with a 5 French straight catheter.  There was really no significant pressure drop throughout the left common iliac and external system that showing that even though the area is significant atherosclerosis of this artery there does not appear to be flow-limiting stenosis.  Individual left lower extremity arteriogram was then obtained from the knee down through the sheath on the left side.  This shows a patent below-knee popliteal artery with three-vessel runoff to the left foot.  5 French sheath was left in place to be pulled the holding area.  The patient tolerated procedure well and there were no complications.  Patient taken the holding area in stable condition.  Operative management: The patient has multiple comorbidities and will be a very high risk operative candidate.  However currently he does not have enough flow to his right leg to even heal an above-knee amputation or a hip disarticulation.  I have offered him a exploration of his right groin to  try to attempt a femoral endarterectomy to achieve some sort of inflow with a left to right femorofemoral bypass.  If we are unable to establish a lumen in the right common femoral artery then he would need potentially a right axillary to popliteal bypass try to salvage his right foot.  All of this certainly would be high risk given the patient's underlying medical comorbidities and overall general deconditioned state.  I discussed all these findings with the patient and his wife.  They are going to think about whether or not they wish to proceed with an operation.  Ruta Hinds, MD [facility] Office: [phone]

## 2019-05-10 NOTE — Interval H&P Note (Signed)
History and Physical Interval Note:  05/10/2019 7:34 AM  Gardena  has presented today for surgery, with the diagnosis of pad.  The various methods of treatment have been discussed with the patient and family. After consideration of risks, benefits and other options for treatment, the patient has consented to  Procedure(s): ABDOMINAL AORTOGRAM W/LOWER EXTREMITY (N/A) as a surgical intervention.  The patient's history has been reviewed, patient examined, no change in status, stable for surgery.  I have reviewed the patient's chart and labs.  Questions were answered to the patient's satisfaction.     Ruta Hinds

## 2019-05-10 NOTE — Progress Notes (Signed)
Client sleeping, awakens to verbal stimuli; no complaints

## 2019-05-10 NOTE — Discharge Instructions (Signed)
Femoral Site Care This sheet gives you information about how to care for yourself after your procedure. Your health care provider may also give you more specific instructions. If you have problems or questions, contact your health care provider. What can I expect after the procedure? After the procedure, it is common to have:  Bruising that usually fades within 1-2 weeks.  Tenderness at the site. Follow these instructions at home: Wound care  Follow instructions from your health care provider about how to take care of your insertion site. Make sure you: ? Wash your hands with soap and water before you change your bandage (dressing). If soap and water are not available, use hand sanitizer. ? Change your dressing as told by your health care provider. ? Leave stitches (sutures), skin glue, or adhesive strips in place. These skin closures may need to stay in place for 2 weeks or longer. If adhesive strip edges start to loosen and curl up, you may trim the loose edges. Do not remove adhesive strips completely unless your health care provider tells you to do that.  Do not take baths, swim, or use a hot tub until your health care provider approves.  You may shower 24-48 hours after the procedure or as told by your health care provider. ? Gently wash the site with plain soap and water. ? Pat the area dry with a clean towel. ? Do not rub the site. This may cause bleeding.  Do not apply powder or lotion to the site. Keep the site clean and dry.  Check your femoral site every day for signs of infection. Check for: ? Redness, swelling, or pain. ? Fluid or blood. ? Warmth. ? Pus or a bad smell. Activity  For the first 2-3 days after your procedure, or as long as directed: ? Avoid climbing stairs as much as possible. ? Do not squat.  Do not lift anything that is heavier than 10 lb (4.5 kg), or the limit that you are told, until your health care provider says that it is safe.  Rest as  directed. ? Avoid sitting for a long time without moving. Get up to take short walks every 1-2 hours.  Do not drive for 24 hours if you were given a medicine to help you relax (sedative). General instructions  Take over-the-counter and prescription medicines only as told by your health care provider.  Keep all follow-up visits as told by your health care provider. This is important. Contact a health care provider if you have:  A fever or chills.  You have redness, swelling, or pain around your insertion site. Get help right away if:  The catheter insertion area swells very fast.  You pass out.  You suddenly start to sweat or your skin gets clammy.  The catheter insertion area is bleeding, and the bleeding does not stop when you hold steady pressure on the area.  The area near or just beyond the catheter insertion site becomes pale, cool, tingly, or numb. These symptoms may represent a serious problem that is an emergency. Do not wait to see if the symptoms will go away. Get medical help right away. Call your local emergency services (911 in the [place] Do not drive yourself to the hospital. Summary  After the procedure, it is common to have bruising that usually fades within 1-2 weeks.  Check your femoral site every day for signs of infection.  Do not lift anything that is heavier than 10 lb (4.5 kg), or the   limit that you are told, until your health care provider says that it is safe. This information is not intended to replace advice given to you by your health care provider. Make sure you discuss any questions you have with your health care provider. Document Revised: 03/06/2017 Document Reviewed: 03/06/2017 Elsevier Patient Education  2020 [employer]  

## 2019-05-13 DIAGNOSIS — D509 Iron deficiency anemia, unspecified: Secondary | ICD-10-CM | POA: Diagnosis not present

## 2019-05-14 DIAGNOSIS — R234 Changes in skin texture: Secondary | ICD-10-CM | POA: Diagnosis not present

## 2019-05-14 DIAGNOSIS — I739 Peripheral vascular disease, unspecified: Secondary | ICD-10-CM | POA: Diagnosis not present

## 2019-05-14 DIAGNOSIS — M79671 Pain in right foot: Secondary | ICD-10-CM | POA: Diagnosis not present

## 2019-05-14 DIAGNOSIS — L03031 Cellulitis of right toe: Secondary | ICD-10-CM | POA: Diagnosis not present

## 2019-05-14 DIAGNOSIS — I998 Other disorder of circulatory system: Secondary | ICD-10-CM | POA: Diagnosis not present

## 2019-05-20 DIAGNOSIS — D509 Iron deficiency anemia, unspecified: Secondary | ICD-10-CM | POA: Diagnosis not present

## 2019-05-29 DIAGNOSIS — I739 Peripheral vascular disease, unspecified: Secondary | ICD-10-CM | POA: Diagnosis not present

## 2019-05-29 DIAGNOSIS — I25700 Atherosclerosis of coronary artery bypass graft(s), unspecified, with unstable angina pectoris: Secondary | ICD-10-CM | POA: Diagnosis not present

## 2019-05-29 DIAGNOSIS — L089 Local infection of the skin and subcutaneous tissue, unspecified: Secondary | ICD-10-CM | POA: Diagnosis not present

## 2019-05-29 DIAGNOSIS — E782 Mixed hyperlipidemia: Secondary | ICD-10-CM | POA: Diagnosis not present

## 2019-05-30 DIAGNOSIS — I5020 Unspecified systolic (congestive) heart failure: Secondary | ICD-10-CM | POA: Diagnosis not present

## 2019-06-04 DIAGNOSIS — M79671 Pain in right foot: Secondary | ICD-10-CM | POA: Diagnosis not present

## 2019-06-04 DIAGNOSIS — I998 Other disorder of circulatory system: Secondary | ICD-10-CM | POA: Diagnosis not present

## 2019-06-04 DIAGNOSIS — L03031 Cellulitis of right toe: Secondary | ICD-10-CM | POA: Diagnosis not present

## 2019-06-04 DIAGNOSIS — I739 Peripheral vascular disease, unspecified: Secondary | ICD-10-CM | POA: Diagnosis not present

## 2019-06-14 DIAGNOSIS — I70245 Atherosclerosis of native arteries of left leg with ulceration of other part of foot: Secondary | ICD-10-CM | POA: Diagnosis not present

## 2019-06-14 DIAGNOSIS — I7092 Chronic total occlusion of artery of the extremities: Secondary | ICD-10-CM | POA: Diagnosis not present

## 2019-06-17 DIAGNOSIS — I739 Peripheral vascular disease, unspecified: Secondary | ICD-10-CM | POA: Diagnosis not present

## 2019-06-17 DIAGNOSIS — I96 Gangrene, not elsewhere classified: Secondary | ICD-10-CM | POA: Diagnosis not present

## 2019-06-20 ENCOUNTER — Ambulatory Visit: Payer: Medicare PPO | Admitting: Vascular Surgery

## 2019-06-20 ENCOUNTER — Other Ambulatory Visit: Payer: Self-pay

## 2019-06-20 ENCOUNTER — Encounter: Payer: Self-pay | Admitting: Vascular Surgery

## 2019-06-20 VITALS — BP 112/63 | HR 83 | Temp 98.0°F | Resp 20 | Ht 65.0 in | Wt 125.0 lb

## 2019-06-20 DIAGNOSIS — I739 Peripheral vascular disease, unspecified: Secondary | ICD-10-CM

## 2019-06-20 NOTE — H&P (View-Only) (Signed)
Patient name: [name] MRN: [id number] DOB: [birth year] Sex: male  HPI: [name] is a 74 y.o. male, who underwent arteriogram by me on May 10, 2019 for a nonhealing wound of his right foot.  At that time he was noted to have occlusion of his right external iliac artery common femoral artery profunda femoris artery superficial femoral artery with reconstitution of a very diseased right below-knee popliteal artery with one-vessel peroneal runoff.  At that time he was offered exploration of his right groin with a possible femoral endarterectomy and left to right femoral-femoral bypass or possible axillary popliteal bypass.  I did not believe he had any percutaneous options due to the extent of his disease.  He was then sent for a second opinion at [facility] via interventional radiology.  He was evaluated by Dr. Earleen Newport who agreed that open surgical procedure was going to be the patient's really only viable option.  Patient is currently being followed at the wound center.  He has not really had any healing of his right foot.  It may be slightly worse.  He has pain in the right foot that is almost continuous.  He is over all very deconditioned from a series of illnesses last year.  He has lost considerable weight and has never really fully recovered.  He has previously had mesenteric artery stenting.  He is able to eat without pain.  He has previously had a carotid endarterectomy and has no stroke symptoms.  He has shortness of breath with minimal exertion.  He has a known left subclavian artery occlusion which is asymptomatic.  He was last seen by his cardiologist December 2020.  At that time his atrial fibrillation was well controlled.  He was on diltiazem for this but not anticoagulation.  Other medical problems include anemia, degenerative back disease, coronary artery disease, esophageal stricture which have been stable.  Past Medical History:  Diagnosis Date  . Anemia   .  Angina   . Atrial fibrillation ([place])   . Blood transfusion   . Bronchitis   . Bulging discs    "8 of them; thoracic, lumbar, sacral area"  . Chronic back pain   . Colon polyps   . Complication of anesthesia   . Coronary artery disease 01/24/11   Successful PCI long segmental stensois  vein graft to the CX marginal vessel-graft previously stented prox. 3.0x4mm TAXUS stent mid seg 3.0x83mm TAXUS stent now tandem stens of 3.0x73mm & 3.0x62mm placed with the seg. 50,80 & 99% stenosis reduced to 0%  . DDD (degenerative disc disease)   . Diastolic heart failure ([place])   . Dysrhythmia    "PAC's and PVC's"  . Esophageal dilatation    "have had it done 7 times; last time ~ 2001"  . Esophageal stricture   . GERD (gastroesophageal reflux disease)   . Headache(784.0)   . Hiatal hernia   . Hyperlipidemia   . Myocardial infarction ([place]) 1991  . Myocardial infarction [facility]) 2005   "had 3 heart attacks this year"  . Paroxysmal atrial fibrillation (H[facility])   . Pyloric stenosis   . RBBB   . Renal artery stenosis ([facility])    hhistory of right renal artery stenting and re-intervention for "in-stent restenosis.  . S/P CABG (coronary artery bypass graft) [place]  . Shortness of breath    "when I had the heart attacks"  . Subclavian artery stenosis, left ([place])   .  Ulcer    "small; Dr. Henrene Pastor found it 02/2010"   Past Surgical History:  Procedure Laterality Date  . ABDOMINAL AORTOGRAM W/LOWER EXTREMITY N/A 05/10/2019   Procedure: ABDOMINAL AORTOGRAM W/LOWER EXTREMITY;  Surgeon: Elam Dutch, MD;  Location: [place] [facility];  Service: Cardiovascular;  Laterality: N/A;  . ADENOIDECTOMY     "when I was an infant"  . APPENDECTOMY  1953  . BALLOON DILATION N/A 10/14/2018   Procedure: BALLOON DILATION;  Surgeon: Lavena Bullion, DO;  Location: [place];  Service: Gastroenterology;  Laterality: N/A;  . BIOPSY  07/26/2018   Procedure: BIOPSY;  Surgeon: Thornton Park, MD;  Location: [place];  Service: Gastroenterology;;  . BIOPSY  10/14/2018   Procedure: BIOPSY;  Surgeon: Lavena Bullion, DO;  Location: [place];  Service: Gastroenterology;;  . CARDIOVERSION N/A 04/17/2017   Procedure: CARDIOVERSION;  Surgeon: Jerline Pain, MD;  Location: [facility];  Service: Cardiovascular;  Laterality: N/A;  . CAROTID ENDARTERECTOMY Right 1996   CE  . COLONOSCOPY WITH PROPOFOL N/A 10/14/2018   Procedure: COLONOSCOPY WITH PROPOFOL;  Surgeon: Lavena Bullion, DO;  Location: [place];  Service: Gastroenterology;  Laterality: N/A;  . CORONARY ANGIOPLASTY WITH STENT PLACEMENT  01/24/11   "today makes a total of 9 stents"  . CORONARY ARTERY BYPASS GRAFT  1984   CABG X 4  . CORONARY ARTERY BYPASS GRAFT  1996   CABG X 4  . ESOPHAGOGASTRODUODENOSCOPY (EGD) WITH PROPOFOL N/A 07/26/2018   Procedure: ESOPHAGOGASTRODUODENOSCOPY (EGD) WITH PROPOFOL;  Surgeon: Thornton Park, MD;  Location: [place];  Service: Gastroenterology;  Laterality: N/A;  . ESOPHAGOGASTRODUODENOSCOPY (EGD) WITH PROPOFOL N/A 10/14/2018   Procedure: ESOPHAGOGASTRODUODENOSCOPY (EGD) WITH PROPOFOL;  Surgeon: Lavena Bullion, DO;  Location: [place];  Service: Gastroenterology;  Laterality: N/A;  . IR THORACENTESIS ASP PLEURAL SPACE W/IMG GUIDE  10/12/2018  . LEFT HEART CATHETERIZATION WITH CORONARY/GRAFT ANGIOGRAM N/A 01/24/2011   Procedure: LEFT HEART CATHETERIZATION WITH Beatrix Fetters;  Surgeon: Troy Sine, MD;  Location: [facility] C[facility] LAB;  Service: Cardiovascular;  Laterality: N/A;  . PERIPHERAL VASCULAR INTERVENTION  07/16/2018   Procedure: PERIPHERAL VASCULAR INTERVENTION;  Surgeon: Elam Dutch, MD;  Location: [place] [facility];  Service: Cardiovascular;;  SMA and Celiac  . pylondial cyst removal    . RETINAL DETACHMENT SURGERY  04/2007   right  . TONSILLECTOMY  1972  . VISCERAL ANGIOGRAPHY N/A 07/16/2018   Procedure: VISCERAL ANGIOGRAPHY;  Surgeon:  Elam Dutch, MD;  Location: [place] [facility];  Service: Cardiovascular;  Laterality: N/A;    Family History  Problem Relation Age of Onset  . Breast cancer Paternal Aunt   . Diabetes Sister   . Ulcers Father   . Heart disease Father   . Heart failure Father   . Heart attack Father   . AAA (abdominal aortic aneurysm) Father   . Stroke Mother   . Hypertension Mother   . Colon cancer Neg Hx     SOCIAL HISTORY: Social History   Socioeconomic History  . Marital status: Married    Spouse name: Not on file  . Number of children: Not on file  . Years of education: Not on file  . Highest education level: Not on file  Occupational History  . Not on file  Tobacco Use  . Smoking status: Former Smoker    Packs/day: 1.00    Years: 38.00    Pack years: 38.00    Types: Cigarettes    Quit  date: 02/09/2005    Years since quitting: 14.3  . Smokeless tobacco: Never Used  . Tobacco comment: "quit smoking cigarrettes 2006"  Substance and Sexual Activity  . Alcohol use: No    Alcohol/week: 0.0 standard drinks  . Drug use: No  . Sexual activity: Not on file  Other Topics Concern  . Not on file  Social History Narrative  . Not on file   Social Determinants of Health   Financial Resource Strain: Low Risk   . Difficulty of Paying Living Expenses: Not hard at all  Food Insecurity: No Food Insecurity  . Worried About Charity fundraiser in the Last Year: Never true  . Ran Out of Food in the Last Year: Never true  Transportation Needs: No Transportation Needs  . Lack of Transportation (Medical): No  . Lack of Transportation (Non-Medical): No  Physical Activity: Inactive  . Days of Exercise per Week: 0 days  . Minutes of Exercise per Session: 0 min  Stress: No Stress Concern Present  . Feeling of Stress : Only a little  Social Connections: Unknown  . Frequency of Communication with Friends and Family: Not on file  . Frequency of Social Gatherings with Friends and Family: Not  on file  . Attends Religious Services: Not on file  . Active Member of Clubs or Organizations: No  . Attends Archivist Meetings: Never  . Marital Status: Married  Human resources officer Violence: Not At Risk  . Fear of Current or Ex-Partner: No  . Emotionally Abused: No  . Physically Abused: No  . Sexually Abused: No    Allergies  Allergen Reactions  . Codeine Nausea And Vomiting  . Sulfa Antibiotics Rash  . Sulfonamide Derivatives Rash    Current Outpatient Medications  Medication Sig Dispense Refill  . aspirin EC 81 MG tablet Take 1 tablet (81 mg total) by mouth daily. 150 tablet 2  . butalbital-aspirin-caffeine (FIORINAL) 50-325-40 MG capsule Take 1 capsule by mouth daily as needed for headache.     . calcium carbonate (TUMS - DOSED IN MG ELEMENTAL CALCIUM) 500 MG chewable tablet Chew 1 tablet (200 mg of elemental calcium total) by mouth 3 (three) times daily as needed for indigestion or heartburn. 30 tablet 0  . diltiazem (CARDIZEM CD) 120 MG 24 hr capsule TAKE 1 CAPSULE BY MOUTH EVERY DAY (Patient taking differently: Take 120 mg by mouth daily. ) 90 capsule 1  . dutasteride (AVODART) 0.5 MG capsule Take 0.5 mg by mouth daily.     Marland Kitchen ezetimibe (ZETIA) 10 MG tablet TAKE 1 TABLET (10 MG TOTAL) BY MOUTH DAILY. (Patient taking differently: Take 10 mg by mouth daily. ) 90 tablet 3  . Gabapentin Enacarbil (HORIZANT) 600 MG TBCR Take 600 mg by mouth 2 (two) times a week. As needed    . Ketotifen Fumarate (ALAWAY OP) Place 1 drop into the right eye daily. Uses before putting contact Lens in R eye    . KLOR-CON M20 20 MEQ tablet Take 20 mEq by mouth daily.    Marland Kitchen loratadine (CLARITIN) 10 MG tablet Take 10 mg by mouth daily as needed for allergies, rhinitis or itching.   3  . lovastatin (MEVACOR) 40 MG tablet TAKE 1 TABLET BY MOUTH AT BEDTIME (Patient taking differently: Take 40 mg by mouth at bedtime. ) 90 tablet 3  . metolazone (ZAROXOLYN) 2.5 MG tablet Take 2.5 mg by mouth See admin  instructions. Take 2.5 mg twice a month    . Multiple  Vitamins-Minerals (MULTIVITAMINS THER. W/MINERALS) TABS Take 1 tablet by mouth daily.      . nitroGLYCERIN (NITROLINGUAL) 0.4 MG/SPRAY spray Place 1 spray under the tongue every 5 (five) minutes x 3 doses as needed for chest pain. 4.9 g 2  . ranolazine (RANEXA) 1000 MG SR tablet TAKE 1 TABLET BY MOUTH TWICE DAILY (Patient taking differently: Take 500 mg by mouth 2 (two) times daily. ) 180 tablet 3  . senna-docusate (SENOKOT-S) 8.6-50 MG tablet Take 1 tablet by mouth daily. (Patient taking differently: Take 2 tablets by mouth at bedtime. ) 30 tablet 6  . torsemide (DEMADEX) 20 MG tablet Take 1 tablet (20 mg total) by mouth 2 (two) times daily. FOR SWELLING OR WEIGHT GAIN (Patient taking differently: Take 40 mg by mouth 2 (two) times daily. FOR SWELLING OR WEIGHT GAIN) 180 tablet 3  . vitamin E 200 UNIT capsule Take 200 Units by mouth daily.     Marland Kitchen esomeprazole (NEXIUM) 40 MG capsule Take 1 capsule (40 mg total) by mouth 2 (two) times daily before a meal. 120 capsule 3  . sucralfate (CARAFATE) 1 GM/10ML suspension Take 10 mLs (1 g total) by mouth 4 (four) times daily -  with meals and at bedtime. (Patient taking differently: Take 1 g by mouth daily. ) 1200 mL 11   No current facility-administered medications for this visit.    ROS:   General:  + weight loss, no fever, chills  HEENT: No recent headaches, no nasal bleeding, no visual changes, no sore throat  Neurologic: No dizziness, blackouts, seizures. No recent symptoms of stroke or mini- stroke. No recent episodes of slurred speech, or temporary blindness.  Cardiac: No recent episodes of chest pain/pressure, no shortness of breath at rest.  + shortness of breath with exertion.  +istory of atrial fibrillation or irregular heartbeat  Vascular: + history of rest pain in feet.  +history of claudication.  + history of non-healing ulcer, No history of DVT   Pulmonary: No home oxygen, no  productive cough, no hemoptysis,  No asthma or wheezing  Musculoskeletal:  [X]  Arthritis, [X]  Low back pain,  [X]  Joint pain  Hematologic:No history of hypercoagulable state.  No history of easy bleeding.  No history of anemia  Gastrointestinal: No hematochezia or melena,  No gastroesophageal reflux, no trouble swallowing  Urinary: [ ]  chronic Kidney disease, [ ]  on HD - [ ]  MWF or [ ]  TTHS, [ ]  Burning with urination, [ ]  Frequent urination, [ ]  Difficulty urinating;   Skin: No rashes  Psychological: No history of anxiety,  No history of depression   Physical Examination  Vitals:   06/20/19 0902  BP: 112/63  Pulse: 83  Resp: 20  Temp: 98 F (36.7 C)  SpO2: 98%  Weight: 125 lb (56.7 kg)  Height: 5\' 5"  (1.651 m)    Body mass index is 20.8 kg/m.  General:  Alert and oriented, no acute distress HEENT: Normal Neck: No JVD Pulmonary: Clear to auscultation bilaterally Cardiac: Regular Rate and Rhythm Abdomen: Soft, non-tender, non-distended, no mass Skin: No rash, early dry gangrenous changes toes 1-3 tip right foot Extremity Pulses: Absent left brachial radial pulse 2+ right radial, brachial, absent right femoral, dorsalis pedis, posterior tibial pulses 1-2+ left femoral pulse absent popliteal and pedal bilaterally Musculoskeletal: 1+ lower extremity edema bilaterally  Neurologic: Upper and lower extremity motor 5/5 and symmetric  DATA:  Patient's most recent ABIs in February were 0.3 on the right 0.7 on the left.  I  reviewed the study today.  I also reviewed all the patient's images from his recent arteriogram March 5.  I also reviewed his most recent echocardiogram results from November 20  ASSESSMENT: Severe peripheral arterial disease with limited revascularization options.  I had a lengthy discussion today with the patient and his wife about realistic expectations and the possibility that his revascularization options are limited and that any attempt to improve the  flow to his right leg is going to be high risk with potential short durability.  However, he does not really have many options since his right foot has constant rest pain and early gangrenous changes.  I do not believe he has any percutaneous options as the span that would have to be recanalized would extend from his knee All the way to his umbilicus.  Dr. Earleen Newport has rendered a second opinion from interventional radiology at [facility] agreed with this.  Additionally the patient is not a candidate for aortobifemoral bypass due to his overall debilitated state.   PLAN: 1.  Patient needs cardiology risk stratification and we will get him scheduled for this appointment in the near future.  If he is of reasonable cardiac risk plan would be to explore his right groin try to do a right femoral endarterectomy and if we could obtain a target do a left to right femoral-femoral bypass.  Otherwise he would undergo a right axillary to popliteal bypass with most likely limited durability but otherwise no real options.  Risk benefits possible complications including not limited to bleeding infection myocardial events pneumonias death were discussed with the patient and his wife today.  They understand and agree to proceed.  Operation is tentatively scheduled for Jul 15, 2019.  Ruta Hinds, MD [facility] Office: [phone]

## 2019-06-20 NOTE — Progress Notes (Signed)
Patient name: [name] MRN: [id number] DOB: [birth year] Sex: male  HPI: [name] is a 74 y.o. male, who underwent arteriogram by me on May 10, 2019 for a nonhealing wound of his right foot.  At that time he was noted to have occlusion of his right external iliac artery common femoral artery profunda femoris artery superficial femoral artery with reconstitution of a very diseased right below-knee popliteal artery with one-vessel peroneal runoff.  At that time he was offered exploration of his right groin with a possible femoral endarterectomy and left to right femoral-femoral bypass or possible axillary popliteal bypass.  I did not believe he had any percutaneous options due to the extent of his disease.  He was then sent for a second opinion at [facility] via interventional radiology.  He was evaluated by Dr. Earleen Newport who agreed that open surgical procedure was going to be the patient's really only viable option.  Patient is currently being followed at the wound center.  He has not really had any healing of his right foot.  It may be slightly worse.  He has pain in the right foot that is almost continuous.  He is over all very deconditioned from a series of illnesses last year.  He has lost considerable weight and has never really fully recovered.  He has previously had mesenteric artery stenting.  He is able to eat without pain.  He has previously had a carotid endarterectomy and has no stroke symptoms.  He has shortness of breath with minimal exertion.  He has a known left subclavian artery occlusion which is asymptomatic.  He was last seen by his cardiologist December 2020.  At that time his atrial fibrillation was well controlled.  He was on diltiazem for this but not anticoagulation.  Other medical problems include anemia, degenerative back disease, coronary artery disease, esophageal stricture which have been stable.  Past Medical History:  Diagnosis Date  . Anemia   .  Angina   . Atrial fibrillation ([place])   . Blood transfusion   . Bronchitis   . Bulging discs    "8 of them; thoracic, lumbar, sacral area"  . Chronic back pain   . Colon polyps   . Complication of anesthesia   . Coronary artery disease 01/24/11   Successful PCI long segmental stensois  vein graft to the CX marginal vessel-graft previously stented prox. 3.0x48mm TAXUS stent mid seg 3.0x51mm TAXUS stent now tandem stens of 3.0x45mm & 3.0x45mm placed with the seg. 50,80 & 99% stenosis reduced to 0%  . DDD (degenerative disc disease)   . Diastolic heart failure ([place])   . Dysrhythmia    "PAC's and PVC's"  . Esophageal dilatation    "have had it done 7 times; last time ~ 2001"  . Esophageal stricture   . GERD (gastroesophageal reflux disease)   . Headache(784.0)   . Hiatal hernia   . Hyperlipidemia   . Myocardial infarction ([place]) 1991  . Myocardial infarction [facility]) 2005   "had 3 heart attacks this year"  . Paroxysmal atrial fibrillation (H[facility])   . Pyloric stenosis   . RBBB   . Renal artery stenosis (H[facility])    hhistory of right renal artery stenting and re-intervention for "in-stent restenosis.  . S/P CABG (coronary artery bypass graft) [place]  . Shortness of breath    "when I had the heart attacks"  . Subclavian artery stenosis, left ([place])   .  Ulcer    "small; Dr. Henrene Pastor found it 02/2010"   Past Surgical History:  Procedure Laterality Date  . ABDOMINAL AORTOGRAM W/LOWER EXTREMITY N/A 05/10/2019   Procedure: ABDOMINAL AORTOGRAM W/LOWER EXTREMITY;  Surgeon: Elam Dutch, MD;  Location: [place] [facility];  Service: Cardiovascular;  Laterality: N/A;  . ADENOIDECTOMY     "when I was an infant"  . APPENDECTOMY  1953  . BALLOON DILATION N/A 10/14/2018   Procedure: BALLOON DILATION;  Surgeon: Lavena Bullion, DO;  Location: [place];  Service: Gastroenterology;  Laterality: N/A;  . BIOPSY  07/26/2018   Procedure: BIOPSY;  Surgeon: Thornton Park, MD;  Location: [place];  Service: Gastroenterology;;  . BIOPSY  10/14/2018   Procedure: BIOPSY;  Surgeon: Lavena Bullion, DO;  Location: [place];  Service: Gastroenterology;;  . CARDIOVERSION N/A 04/17/2017   Procedure: CARDIOVERSION;  Surgeon: Jerline Pain, MD;  Location: [facility] ENDOSCOPY;  Service: Cardiovascular;  Laterality: N/A;  . CAROTID ENDARTERECTOMY Right 1996   CE  . COLONOSCOPY WITH PROPOFOL N/A 10/14/2018   Procedure: COLONOSCOPY WITH PROPOFOL;  Surgeon: Lavena Bullion, DO;  Location: [place];  Service: Gastroenterology;  Laterality: N/A;  . CORONARY ANGIOPLASTY WITH STENT PLACEMENT  01/24/11   "today makes a total of 9 stents"  . CORONARY ARTERY BYPASS GRAFT  1984   CABG X 4  . CORONARY ARTERY BYPASS GRAFT  1996   CABG X 4  . ESOPHAGOGASTRODUODENOSCOPY (EGD) WITH PROPOFOL N/A 07/26/2018   Procedure: ESOPHAGOGASTRODUODENOSCOPY (EGD) WITH PROPOFOL;  Surgeon: Thornton Park, MD;  Location: [place];  Service: Gastroenterology;  Laterality: N/A;  . ESOPHAGOGASTRODUODENOSCOPY (EGD) WITH PROPOFOL N/A 10/14/2018   Procedure: ESOPHAGOGASTRODUODENOSCOPY (EGD) WITH PROPOFOL;  Surgeon: Lavena Bullion, DO;  Location: [place];  Service: Gastroenterology;  Laterality: N/A;  . IR THORACENTESIS ASP PLEURAL SPACE W/IMG GUIDE  10/12/2018  . LEFT HEART CATHETERIZATION WITH CORONARY/GRAFT ANGIOGRAM N/A 01/24/2011   Procedure: LEFT HEART CATHETERIZATION WITH Beatrix Fetters;  Surgeon: Troy Sine, MD;  Location: [facility] CATH LAB;  Service: Cardiovascular;  Laterality: N/A;  . PERIPHERAL VASCULAR INTERVENTION  07/16/2018   Procedure: PERIPHERAL VASCULAR INTERVENTION;  Surgeon: Elam Dutch, MD;  Location: [place] [facility];  Service: Cardiovascular;;  SMA and Celiac  . pylondial cyst removal    . RETINAL DETACHMENT SURGERY  04/2007   right  . TONSILLECTOMY  1972  . VISCERAL ANGIOGRAPHY N/A 07/16/2018   Procedure: VISCERAL ANGIOGRAPHY;  Surgeon:  Elam Dutch, MD;  Location: [place] [facility];  Service: Cardiovascular;  Laterality: N/A;    Family History  Problem Relation Age of Onset  . Breast cancer Paternal Aunt   . Diabetes Sister   . Ulcers Father   . Heart disease Father   . Heart failure Father   . Heart attack Father   . AAA (abdominal aortic aneurysm) Father   . Stroke Mother   . Hypertension Mother   . Colon cancer Neg Hx     SOCIAL HISTORY: Social History   Socioeconomic History  . Marital status: Married    Spouse name: Not on file  . Number of children: Not on file  . Years of education: Not on file  . Highest education level: Not on file  Occupational History  . Not on file  Tobacco Use  . Smoking status: Former Smoker    Packs/day: 1.00    Years: 38.00    Pack years: 38.00    Types: Cigarettes    Quit  date: 02/09/2005    Years since quitting: 14.3  . Smokeless tobacco: Never Used  . Tobacco comment: "quit smoking cigarrettes 2006"  Substance and Sexual Activity  . Alcohol use: No    Alcohol/week: 0.0 standard drinks  . Drug use: No  . Sexual activity: Not on file  Other Topics Concern  . Not on file  Social History Narrative  . Not on file   Social Determinants of Health   Financial Resource Strain: Low Risk   . Difficulty of Paying Living Expenses: Not hard at all  Food Insecurity: No Food Insecurity  . Worried About Charity fundraiser in the Last Year: Never true  . Ran Out of Food in the Last Year: Never true  Transportation Needs: No Transportation Needs  . Lack of Transportation (Medical): No  . Lack of Transportation (Non-Medical): No  Physical Activity: Inactive  . Days of Exercise per Week: 0 days  . Minutes of Exercise per Session: 0 min  Stress: No Stress Concern Present  . Feeling of Stress : Only a little  Social Connections: Unknown  . Frequency of Communication with Friends and Family: Not on file  . Frequency of Social Gatherings with Friends and Family: Not  on file  . Attends Religious Services: Not on file  . Active Member of Clubs or Organizations: No  . Attends Archivist Meetings: Never  . Marital Status: Married  Human resources officer Violence: Not At Risk  . Fear of Current or Ex-Partner: No  . Emotionally Abused: No  . Physically Abused: No  . Sexually Abused: No    Allergies  Allergen Reactions  . Codeine Nausea And Vomiting  . Sulfa Antibiotics Rash  . Sulfonamide Derivatives Rash    Current Outpatient Medications  Medication Sig Dispense Refill  . aspirin EC 81 MG tablet Take 1 tablet (81 mg total) by mouth daily. 150 tablet 2  . butalbital-aspirin-caffeine (FIORINAL) 50-325-40 MG capsule Take 1 capsule by mouth daily as needed for headache.     . calcium carbonate (TUMS - DOSED IN MG ELEMENTAL CALCIUM) 500 MG chewable tablet Chew 1 tablet (200 mg of elemental calcium total) by mouth 3 (three) times daily as needed for indigestion or heartburn. 30 tablet 0  . diltiazem (CARDIZEM CD) 120 MG 24 hr capsule TAKE 1 CAPSULE BY MOUTH EVERY DAY (Patient taking differently: Take 120 mg by mouth daily. ) 90 capsule 1  . dutasteride (AVODART) 0.5 MG capsule Take 0.5 mg by mouth daily.     Marland Kitchen ezetimibe (ZETIA) 10 MG tablet TAKE 1 TABLET (10 MG TOTAL) BY MOUTH DAILY. (Patient taking differently: Take 10 mg by mouth daily. ) 90 tablet 3  . Gabapentin Enacarbil (HORIZANT) 600 MG TBCR Take 600 mg by mouth 2 (two) times a week. As needed    . Ketotifen Fumarate (ALAWAY OP) Place 1 drop into the right eye daily. Uses before putting contact Lens in R eye    . KLOR-CON M20 20 MEQ tablet Take 20 mEq by mouth daily.    Marland Kitchen loratadine (CLARITIN) 10 MG tablet Take 10 mg by mouth daily as needed for allergies, rhinitis or itching.   3  . lovastatin (MEVACOR) 40 MG tablet TAKE 1 TABLET BY MOUTH AT BEDTIME (Patient taking differently: Take 40 mg by mouth at bedtime. ) 90 tablet 3  . metolazone (ZAROXOLYN) 2.5 MG tablet Take 2.5 mg by mouth See admin  instructions. Take 2.5 mg twice a month    . Multiple  Vitamins-Minerals (MULTIVITAMINS THER. W/MINERALS) TABS Take 1 tablet by mouth daily.      . nitroGLYCERIN (NITROLINGUAL) 0.4 MG/SPRAY spray Place 1 spray under the tongue every 5 (five) minutes x 3 doses as needed for chest pain. 4.9 g 2  . ranolazine (RANEXA) 1000 MG SR tablet TAKE 1 TABLET BY MOUTH TWICE DAILY (Patient taking differently: Take 500 mg by mouth 2 (two) times daily. ) 180 tablet 3  . senna-docusate (SENOKOT-S) 8.6-50 MG tablet Take 1 tablet by mouth daily. (Patient taking differently: Take 2 tablets by mouth at bedtime. ) 30 tablet 6  . torsemide (DEMADEX) 20 MG tablet Take 1 tablet (20 mg total) by mouth 2 (two) times daily. FOR SWELLING OR WEIGHT GAIN (Patient taking differently: Take 40 mg by mouth 2 (two) times daily. FOR SWELLING OR WEIGHT GAIN) 180 tablet 3  . vitamin E 200 UNIT capsule Take 200 Units by mouth daily.     Marland Kitchen esomeprazole (NEXIUM) 40 MG capsule Take 1 capsule (40 mg total) by mouth 2 (two) times daily before a meal. 120 capsule 3  . sucralfate (CARAFATE) 1 GM/10ML suspension Take 10 mLs (1 g total) by mouth 4 (four) times daily -  with meals and at bedtime. (Patient taking differently: Take 1 g by mouth daily. ) 1200 mL 11   No current facility-administered medications for this visit.    ROS:   General:  + weight loss, no fever, chills  HEENT: No recent headaches, no nasal bleeding, no visual changes, no sore throat  Neurologic: No dizziness, blackouts, seizures. No recent symptoms of stroke or mini- stroke. No recent episodes of slurred speech, or temporary blindness.  Cardiac: No recent episodes of chest pain/pressure, no shortness of breath at rest.  + shortness of breath with exertion.  +istory of atrial fibrillation or irregular heartbeat  Vascular: + history of rest pain in feet.  +history of claudication.  + history of non-healing ulcer, No history of DVT   Pulmonary: No home oxygen, no  productive cough, no hemoptysis,  No asthma or wheezing  Musculoskeletal:  [X]  Arthritis, [X]  Low back pain,  [X]  Joint pain  Hematologic:No history of hypercoagulable state.  No history of easy bleeding.  No history of anemia  Gastrointestinal: No hematochezia or melena,  No gastroesophageal reflux, no trouble swallowing  Urinary: [ ]  chronic Kidney disease, [ ]  on HD - [ ]  MWF or [ ]  TTHS, [ ]  Burning with urination, [ ]  Frequent urination, [ ]  Difficulty urinating;   Skin: No rashes  Psychological: No history of anxiety,  No history of depression   Physical Examination  Vitals:   06/20/19 0902  BP: 112/63  Pulse: 83  Resp: 20  Temp: 98 F (36.7 C)  SpO2: 98%  Weight: 125 lb (56.7 kg)  Height: 5\' 5"  (1.651 m)    Body mass index is 20.8 kg/m.  General:  Alert and oriented, no acute distress HEENT: Normal Neck: No JVD Pulmonary: Clear to auscultation bilaterally Cardiac: Regular Rate and Rhythm Abdomen: Soft, non-tender, non-distended, no mass Skin: No rash, early dry gangrenous changes toes 1-3 tip right foot Extremity Pulses: Absent left brachial radial pulse 2+ right radial, brachial, absent right femoral, dorsalis pedis, posterior tibial pulses 1-2+ left femoral pulse absent popliteal and pedal bilaterally Musculoskeletal: 1+ lower extremity edema bilaterally  Neurologic: Upper and lower extremity motor 5/5 and symmetric  DATA:  Patient's most recent ABIs in February were 0.3 on the right 0.7 on the left.  I  reviewed the study today.  I also reviewed all the patient's images from his recent arteriogram March 5.  I also reviewed his most recent echocardiogram results from November 20  ASSESSMENT: Severe peripheral arterial disease with limited revascularization options.  I had a lengthy discussion today with the patient and his wife about realistic expectations and the possibility that his revascularization options are limited and that any attempt to improve the  flow to his right leg is going to be high risk with potential short durability.  However, he does not really have many options since his right foot has constant rest pain and early gangrenous changes.  I do not believe he has any percutaneous options as the span that would have to be recanalized would extend from his knee All the way to his umbilicus.  Dr. Earleen Newport has rendered a second opinion from interventional radiology at [facility] agreed with this.  Additionally the patient is not a candidate for aortobifemoral bypass due to his overall debilitated state.   PLAN: 1.  Patient needs cardiology risk stratification and we will get him scheduled for this appointment in the near future.  If he is of reasonable cardiac risk plan would be to explore his right groin try to do a right femoral endarterectomy and if we could obtain a target do a left to right femoral-femoral bypass.  Otherwise he would undergo a right axillary to popliteal bypass with most likely limited durability but otherwise no real options.  Risk benefits possible complications including not limited to bleeding infection myocardial events pneumonias death were discussed with the patient and his wife today.  They understand and agree to proceed.  Operation is tentatively scheduled for Jul 15, 2019.  Ruta Hinds, MD [facility] [place] Office: [phone]

## 2019-06-21 ENCOUNTER — Ambulatory Visit: Payer: Medicare PPO | Admitting: Cardiovascular Disease

## 2019-06-25 DIAGNOSIS — S72143A Displaced intertrochanteric fracture of unspecified femur, initial encounter for closed fracture: Secondary | ICD-10-CM | POA: Diagnosis not present

## 2019-06-25 DIAGNOSIS — S72141D Displaced intertrochanteric fracture of right femur, subsequent encounter for closed fracture with routine healing: Secondary | ICD-10-CM | POA: Diagnosis not present

## 2019-06-26 DIAGNOSIS — S81802A Unspecified open wound, left lower leg, initial encounter: Secondary | ICD-10-CM | POA: Diagnosis not present

## 2019-06-26 DIAGNOSIS — S81809A Unspecified open wound, unspecified lower leg, initial encounter: Secondary | ICD-10-CM | POA: Diagnosis not present

## 2019-06-26 DIAGNOSIS — S91001A Unspecified open wound, right ankle, initial encounter: Secondary | ICD-10-CM | POA: Diagnosis not present

## 2019-06-26 DIAGNOSIS — S91301A Unspecified open wound, right foot, initial encounter: Secondary | ICD-10-CM | POA: Diagnosis not present

## 2019-06-26 DIAGNOSIS — I7389 Other specified peripheral vascular diseases: Secondary | ICD-10-CM | POA: Diagnosis not present

## 2019-06-26 DIAGNOSIS — I739 Peripheral vascular disease, unspecified: Secondary | ICD-10-CM | POA: Diagnosis not present

## 2019-06-26 DIAGNOSIS — S81801A Unspecified open wound, right lower leg, initial encounter: Secondary | ICD-10-CM | POA: Diagnosis not present

## 2019-06-27 ENCOUNTER — Other Ambulatory Visit: Payer: Self-pay | Admitting: Cardiovascular Disease

## 2019-06-27 MED ORDER — EZETIMIBE 10 MG PO TABS: ORAL_TABLET | ORAL | 0 refills | Status: DC

## 2019-06-27 NOTE — Telephone Encounter (Signed)
Rx(s) sent to pharmacy electronically.  

## 2019-06-27 NOTE — Telephone Encounter (Signed)
*  STAT* If patient is at the pharmacy, call can be transferred to refill team.   1. Which medications need to be refilled? (please list name of each medication and dose if known)  ezetimibe (ZETIA) 10 MG tablet  2. Which pharmacy/location (including street and city if local pharmacy) is medication to be sent to? [id number] - [place], [place] - [address]  3. Do they need a 30 day or 90 day supply? 90  Patient is out of medication. He has an appt to see Almyra Deforest 04-30

## 2019-06-30 DIAGNOSIS — I5020 Unspecified systolic (congestive) heart failure: Secondary | ICD-10-CM | POA: Diagnosis not present

## 2019-07-05 ENCOUNTER — Ambulatory Visit: Payer: Medicare PPO | Admitting: Physician Assistant

## 2019-07-05 ENCOUNTER — Other Ambulatory Visit: Payer: Self-pay

## 2019-07-05 ENCOUNTER — Telehealth (HOSPITAL_COMMUNITY): Payer: Self-pay | Admitting: *Deleted

## 2019-07-05 ENCOUNTER — Encounter: Payer: Self-pay | Admitting: Cardiovascular Disease

## 2019-07-05 ENCOUNTER — Encounter: Payer: Self-pay | Admitting: Physician Assistant

## 2019-07-05 VITALS — BP 124/69 | HR 93 | Temp 97.2°F | Ht 65.0 in | Wt 124.0 lb

## 2019-07-05 DIAGNOSIS — I701 Atherosclerosis of renal artery: Secondary | ICD-10-CM | POA: Diagnosis not present

## 2019-07-05 DIAGNOSIS — I739 Peripheral vascular disease, unspecified: Secondary | ICD-10-CM | POA: Diagnosis not present

## 2019-07-05 DIAGNOSIS — I25810 Atherosclerosis of coronary artery bypass graft(s) without angina pectoris: Secondary | ICD-10-CM

## 2019-07-05 DIAGNOSIS — E785 Hyperlipidemia, unspecified: Secondary | ICD-10-CM

## 2019-07-05 DIAGNOSIS — I6523 Occlusion and stenosis of bilateral carotid arteries: Secondary | ICD-10-CM

## 2019-07-05 DIAGNOSIS — Z01810 Encounter for preprocedural cardiovascular examination: Secondary | ICD-10-CM

## 2019-07-05 DIAGNOSIS — I4819 Other persistent atrial fibrillation: Secondary | ICD-10-CM | POA: Diagnosis not present

## 2019-07-05 DIAGNOSIS — I350 Nonrheumatic aortic (valve) stenosis: Secondary | ICD-10-CM

## 2019-07-05 DIAGNOSIS — I10 Essential (primary) hypertension: Secondary | ICD-10-CM | POA: Diagnosis not present

## 2019-07-05 MED ORDER — EZETIMIBE 10 MG PO TABS: ORAL_TABLET | ORAL | 0 refills | Status: DC

## 2019-07-05 NOTE — Patient Instructions (Signed)
Medication Instructions:  Your physician recommends that you continue on your current medications as directed. Please refer to the Current Medication list given to you today.  *If you need a refill on your cardiac medications before your next appointment, please call your pharmacy*   Lab Work: NONE ordered at this time of appointment   If you have labs (blood work) drawn today and your tests are completely normal, you will receive your results only by: Marland Kitchen MyChart Message (if you have MyChart) OR . A paper copy in the mail If you have any lab test that is abnormal or we need to change your treatment, we will call you to review the results.   Testing/Procedures: Your physician has requested that you have an echocardiogram. Echocardiography is a painless test that uses sound waves to create images of your heart. It provides your doctor with information about the size and shape of your heart and how well your heart's chambers and valves are working. This procedure takes approximately one hour. There are no restrictions for this procedure.  Your physician has requested that you have a lexiscan myoview. For further information please visit [email or web address]. Please follow instruction sheet, as given.   Please schedule both for 07/09/19 or 07/10/19   Follow-Up: At [facility], you and your health needs are our priority.  As part of our continuing mission to provide you with exceptional heart care, we have created designated Provider Care Teams.  These Care Teams include your primary Cardiologist (physician) and Advanced Practice Providers (APPs -  Physician Assistants and Nurse Practitioners) who all work together to provide you with the care you need, when you need it.  Your next appointment:   6-8 week(s)  The format for your next appointment:   In Person  Provider:   Shelva Majestic, MD  Other Instructions

## 2019-07-05 NOTE — Progress Notes (Signed)
Cardiology Office Note:    Date:  07/07/2019   ID:  [name], Alferd Apa 12/17/1945, MRN [id number]  PCP:  Garwin Brothers, MD  Cardiologist:  Shelva Majestic, MD  Electrophysiologist:  Constance Haw, MD   Referring MD: Garwin Brothers, MD   Chief Complaint  Patient presents with  . Follow-up    seen for Dr. Claiborne Billings.     History of Present Illness:    Amour Lathyn Griggs is a 74 y.o. male with a hx of CAD s/p [place] and redo CABG in 1996,PADwith bilateral SFA occlusion,carotid artery disease s/pright CEA2005, right bundle branch block, renal artery stenosis s/pright renal artery stenting andrepeatintervention, subclavian artery stenosis,CKD stage II-III, HTN, HLD, and persistent atrial fibrillation.In 1998, he underwent stenting of his left main artery and stenting of SVG to OM. He had stenting of SVG to marginal vessel in 2012.Myoview in May 2015 showed mild inferobasal ischemia likely in the RCA distribution. He was started on Eliquis in May 2018 after discontinuing the Plavix. Repeat echocardiogram in June 2018 showed EF 55 to 60%, PA peak pressure elevated at 48 mmHg, moderate LVH. Due to recurrent atrial fibrillation, he was referred to Dr. Curt Bears in January 2019. Due to QTC prolongation, he was not felt to be a candidate for Tikosyn or sotalol and also felt to be high risk for ablation. As result, it was recommended to reinitiate amiodarone therapy in February 2019 and he subsequently underwent successful cardioversion. Carotid Doppler obtained in February 2020 showed 40 to 59% bilateral ICA disease, right subclavian artery was stenotic, left subclavian artery flow was disturbed. He was admitted in May 2020 with progressive abdominal pain and generalized weakness. D-dimer was elevated and troponin waselevated as well. Cardiology was consulted and felt the troponin elevation was due to demand ischemia as he specifically denied any recent chest pain. Troponin  eventually peaked at 5.16 before trending back down. Work-up did not demonstrate any PE however did notice SMA stenosis. Vascular surgery was consulted for mesenteric ischemia. He eventually underwent selective celiac and superior mesenteric angiogram and stenting of the celiac artery and superior mesenteric artery by Dr. Oneida Alar.  He returned to the hospital on 5/18 due to significant heartburn, nausea, vomiting and dysphasia. He was evaluated by GI with endoscopy on 5/20 revealing nonbleeding esophageal ulcer, benign-appearing esophageal stenosis, gastric stenosis at the pylorus. Esophageal stricture was not dilated due to presence of ulcer per GI. 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 Vascular surgery was consulted and treated pseudoaneurysm with compression x2. Follow-up duplex showed aneurysm was successfully closed. Eliquis restarted on 07/28/2018.Patient was readmitted in August 2020 with generalized weakness and exercise intolerance. He was noted to be severely anemic with hemoglobin down to 6.7. He was transfused 2 units of packed red blood cell. He also had acute kidney injury as well. He was discharged on Protonix 40 mg twice daily and Carafate. GI felt her anemia was not from the GI tract and recommended hematology consult. He was restarted on Eliquis.During the hospitalization, he was noted to have significant pleural effusion and underwent left and right thoracentesis. (1.7 L removed from right side, 650 cc removed from left side) He has been readmitted at [facility] for volume overload.  Echocardiogram showed EF down to 35-40% with moderate to severe aortic stenosis.  He also had moderate right-sided pleural effusion and underwent ultrasound-guided thoracentesis again on 9/24 with 2 L of pleural fluid drained.  Due to acute on  chronic anemia, he was transfused 1 pack of red blood cell. He was treated again for volume overload. His  diuretic was switched to torsemide 40 mg twice daily upon discharge. He is no longer on Eliquis due to severe anemia.  Repeat echocardiogram obtained in November 2020 showed EF 50 to 55%, unable to accurately assess aortic stenosis, mild MR.  More recently, patient underwent lower extremity angiography due to nonhealing lower extremity ulcers.  This revealed occluded right external iliac artery, occluded right common femoral artery profunda femoris and superficial femoral artery, occlusion of left profunda femoris and left superficial femoral artery.  He is planning to proceed with a right groin exploration by Dr. Oneida Alar on 07/15/2019.  Patient presents today for cardiology office visit.  He denies any recent chest pain.  He has no right lower extremity edema however does have 1+ left lower extremity edema.  The left lower extremity edema is likely chronic given the previous history of vein harvesting.  On exam, he has bronchi in bilateral bases of the lung, he has been coughing up phlegm for the past week, however no fever or chill.  He has no crackles or wheezing on exam.  He is aware that if the coughing does not improve, he will need to be evaluated by his PCP.  At this point, without any additional work-up, I fear he would be a at least moderate if not a high risk patient proceeding with a high risk procedure.  In order to further risk stratify him, I plan to order repeat echocardiogram and Myoview.  The purpose of the echocardiogram is to reassess his aortic valve to make sure his aortic stenosis is not severe.   Past Medical History:  Diagnosis Date  . Anemia   . Angina   . Atrial fibrillation ([place])   . Blood transfusion   . Bronchitis   . Bulging discs    "8 of them; thoracic, lumbar, sacral area"  . Chronic back pain   . Colon polyps   . Complication of anesthesia   . Coronary artery disease 01/24/11   Successful PCI long segmental stensois  vein graft to the CX marginal vessel-graft  previously stented prox. 3.0x31mm TAXUS stent mid seg 3.0x14mm TAXUS stent now tandem stens of 3.0x18mm & 3.0x59mm placed with the seg. 50,80 & 99% stenosis reduced to 0%  . DDD (degenerative disc disease)   . Diastolic heart failure ([place])   . Dysrhythmia    "PAC's and PVC's"  . Esophageal dilatation    "have had it done 7 times; last time ~ 2001"  . Esophageal stricture   . GERD (gastroesophageal reflux disease)   . Headache(784.0)   . Hiatal hernia   . Hyperlipidemia   . Myocardial infarction ([place]) 1991  . Myocardial infarction [facility]) 2005   "had 3 heart attacks this year"  . Paroxysmal atrial fibrillation (HCC)   . Pyloric stenosis   . RBBB   . Renal artery stenosis (HCC)    hhistory of right renal artery stenting and re-intervention for "in-stent restenosis.  . S/P CABG (coronary artery bypass graft) [place]  . Shortness of breath    "when I had the heart attacks"  . Subclavian artery stenosis, left ([place])   . Ulcer    "small; Dr. Henrene Pastor found it 02/2010"    Past Surgical History:  Procedure Laterality Date  . ABDOMINAL AORTOGRAM W/LOWER EXTREMITY N/A 05/10/2019   Procedure: ABDOMINAL AORTOGRAM W/LOWER EXTREMITY;  Surgeon: Elam Dutch, MD;  Location: [place] CV [facility];  Service: Cardiovascular;  Laterality: N/A;  . ADENOIDECTOMY     "when I was an infant"  . APPENDECTOMY  1953  . BALLOON DILATION N/A 10/14/2018   Procedure: BALLOON DILATION;  Surgeon: Lavena Bullion, DO;  Location: [place];  Service: Gastroenterology;  Laterality: N/A;  . BIOPSY  07/26/2018   Procedure: BIOPSY;  Surgeon: Thornton Park, MD;  Location: [place];  Service: Gastroenterology;;  . BIOPSY  10/14/2018   Procedure: BIOPSY;  Surgeon: Lavena Bullion, DO;  Location: [place];  Service: Gastroenterology;;  . CARDIOVERSION N/A 04/17/2017   Procedure: CARDIOVERSION;  Surgeon: Jerline Pain, MD;  Location: [facility] ENDOSCOPY;  Service:  Cardiovascular;  Laterality: N/A;  . CAROTID ENDARTERECTOMY Right 1996   CE  . COLONOSCOPY WITH PROPOFOL N/A 10/14/2018   Procedure: COLONOSCOPY WITH PROPOFOL;  Surgeon: Lavena Bullion, DO;  Location: [place];  Service: Gastroenterology;  Laterality: N/A;  . CORONARY ANGIOPLASTY WITH STENT PLACEMENT  01/24/11   "today makes a total of 9 stents"  . CORONARY ARTERY BYPASS GRAFT  1984   CABG X 4  . CORONARY ARTERY BYPASS GRAFT  1996   CABG X 4  . ESOPHAGOGASTRODUODENOSCOPY (EGD) WITH PROPOFOL N/A 07/26/2018   Procedure: ESOPHAGOGASTRODUODENOSCOPY (EGD) WITH PROPOFOL;  Surgeon: Thornton Park, MD;  Location: [place];  Service: Gastroenterology;  Laterality: N/A;  . ESOPHAGOGASTRODUODENOSCOPY (EGD) WITH PROPOFOL N/A 10/14/2018   Procedure: ESOPHAGOGASTRODUODENOSCOPY (EGD) WITH PROPOFOL;  Surgeon: Lavena Bullion, DO;  Location: [place];  Service: Gastroenterology;  Laterality: N/A;  . IR THORACENTESIS ASP PLEURAL SPACE W/IMG GUIDE  10/12/2018  . LEFT HEART CATHETERIZATION WITH CORONARY/GRAFT ANGIOGRAM N/A 01/24/2011   Procedure: LEFT HEART CATHETERIZATION WITH Beatrix Fetters;  Surgeon: Troy Sine, MD;  Location: [facility] CATH LAB;  Service: Cardiovascular;  Laterality: N/A;  . PERIPHERAL VASCULAR INTERVENTION  07/16/2018   Procedure: PERIPHERAL VASCULAR INTERVENTION;  Surgeon: Elam Dutch, MD;  Location: [place] [facility];  Service: Cardiovascular;;  SMA and Celiac  . pylondial cyst removal    . RETINAL DETACHMENT SURGERY  04/2007   right  . TONSILLECTOMY  1972  . VISCERAL ANGIOGRAPHY N/A 07/16/2018   Procedure: VISCERAL ANGIOGRAPHY;  Surgeon: Elam Dutch, MD;  Location: [place] [facility];  Service: Cardiovascular;  Laterality: N/A;    Current Medications: Current Meds  Medication Sig  . aspirin EC 81 MG tablet Take 1 tablet (81 mg total) by mouth daily.  . butalbital-aspirin-caffeine (FIORINAL) 50-325-40 MG capsule Take 1 capsule by mouth daily as  needed for headache.   . calcium carbonate (TUMS - DOSED IN MG ELEMENTAL CALCIUM) 500 MG chewable tablet Chew 1 tablet (200 mg of elemental calcium total) by mouth 3 (three) times daily as needed for indigestion or heartburn.  . diltiazem (CARDIZEM CD) 120 MG 24 hr capsule TAKE 1 CAPSULE BY MOUTH EVERY DAY (Patient taking differently: Take 120 mg by mouth daily. )  . dutasteride (AVODART) 0.5 MG capsule Take 0.5 mg by mouth daily.   Marland Kitchen esomeprazole (NEXIUM) 40 MG capsule Take 1 capsule (40 mg total) by mouth 2 (two) times daily before a meal.  . ezetimibe (ZETIA) 10 MG tablet TAKE 1 TABLET (10 MG TOTAL) BY MOUTH DAILY.  . Gabapentin Enacarbil (HORIZANT) 600 MG TBCR Take 600 mg by mouth daily as needed (restless leg). (1700)  . Ketotifen Fumarate (ALAWAY OP) Place 1 drop into the right eye daily. Uses before putting contact  Lens in R eye  . loratadine (CLARITIN) 10 MG tablet Take 10 mg by mouth daily as needed for allergies, rhinitis or itching.   . lovastatin (MEVACOR) 40 MG tablet TAKE 1 TABLET BY MOUTH AT BEDTIME (Patient taking differently: Take 20 mg by mouth at bedtime. )  . metolazone (ZAROXOLYN) 2.5 MG tablet Take 2.5 mg by mouth daily as needed (fluid retention).   . Multiple Vitamins-Minerals (MULTIVITAMINS THER. W/MINERALS) TABS Take 1 tablet by mouth every evening.   . nitroGLYCERIN (NITROLINGUAL) 0.4 MG/SPRAY spray Place 1 spray under the tongue every 5 (five) minutes x 3 doses as needed for chest pain.  Marland Kitchen Potassium Chloride ER 20 MEQ TBCR Take 20 mEq by mouth every evening.   . ranolazine (RANEXA) 500 MG 12 hr tablet Take 500 mg by mouth 2 (two) times daily.  Marland Kitchen senna-docusate (SENOKOT-S) 8.6-50 MG tablet Take 1 tablet by mouth daily. (Patient taking differently: Take 1 tablet by mouth at bedtime. )  . sucralfate (CARAFATE) 1 GM/10ML suspension Take 10 mLs (1 g total) by mouth 4 (four) times daily -  with meals and at bedtime. (Patient taking differently: Take 1 g by mouth in the morning,  at noon, in the evening, and at bedtime. )  . torsemide (DEMADEX) 20 MG tablet Take 40 mg by mouth 2 (two) times daily. Take 40 mg by mouth 2 times a day. For swelling or weight gain  . vitamin E 200 UNIT capsule Take 200 Units by mouth every evening.   . [DISCONTINUED] ezetimibe (ZETIA) 10 MG tablet TAKE 1 TABLET (10 MG TOTAL) BY MOUTH DAILY. (Patient taking differently: Take 10 mg by mouth daily. )  . [DISCONTINUED] ranolazine (RANEXA) 1000 MG SR tablet TAKE 1 TABLET BY MOUTH TWICE DAILY (Patient taking differently: Take 500 mg by mouth 2 (two) times daily. )     Allergies:   Codeine, Sulfa antibiotics, and Sulfonamide derivatives   Social History   Socioeconomic History  . Marital status: Married    Spouse name: Not on file  . Number of children: Not on file  . Years of education: Not on file  . Highest education level: Not on file  Occupational History  . Not on file  Tobacco Use  . Smoking status: Former Smoker    Packs/day: 1.00    Years: 38.00    Pack years: 38.00    Types: Cigarettes    Quit date: 02/09/2005    Years since quitting: 14.4  . Smokeless tobacco: Never Used  . Tobacco comment: "quit smoking cigarrettes 2006"  Substance and Sexual Activity  . Alcohol use: No    Alcohol/week: 0.0 standard drinks  . Drug use: No  . Sexual activity: Not on file  Other Topics Concern  . Not on file  Social History Narrative  . Not on file   Social Determinants of Health   Financial Resource Strain: Low Risk   . Difficulty of Paying Living Expenses: Not hard at all  Food Insecurity: No Food Insecurity  . Worried About Charity fundraiser in the Last Year: Never true  . Ran Out of Food in the Last Year: Never true  Transportation Needs: No Transportation Needs  . Lack of Transportation (Medical): No  . Lack of Transportation (Non-Medical): No  Physical Activity: Inactive  . Days of Exercise per Week: 0 days  . Minutes of Exercise per Session: 0 min  Stress: No Stress  Concern Present  . Feeling of Stress : Only a little  Social Connections: Unknown  . Frequency of Communication with Friends and Family: Not on file  . Frequency of Social Gatherings with Friends and Family: Not on file  . Attends Religious Services: Not on file  . Active Member of Clubs or Organizations: No  . Attends Archivist Meetings: Never  . Marital Status: Married     Family History: The patient's family history includes AAA (abdominal aortic aneurysm) in his father; Breast cancer in his paternal aunt; Diabetes in his sister; Heart attack in his father; Heart disease in his father; Heart failure in his father; Hypertension in his mother; Stroke in his mother; Ulcers in his father. There is no history of Colon cancer.  ROS:   Please see the history of present illness.     All other systems reviewed and are negative.  EKGs/Labs/Other Studies Reviewed:    The following studies were reviewed today:  Echo 01/18/2019 1. Left ventricular ejection fraction, by visual estimation, is 50 to  55%. The left ventricle has low normal function. There is mildly increased  left ventricular hypertrophy.  2. Basal and mid inferolateral wall and basal inferior segment are  abnormal.  3. Left ventricular diastolic function could not be evaluated.  4. Global right ventricle has low normal systolic function.The right  ventricular size is normal. No increase in right ventricular wall  thickness.  5. The aortic valve is severely calcified with restricuted leaflet  motion. The Vmax is 2.2 m/2 and mean gradient is 11 mmHG. The AVA is  severely underestimated due to nearly perpendicular angulation of LVOT PW  doppler assessment. There is likely mild AS  but for proper assessment, would need to repeat with better interrogation  of the LVOT.  6. Left atrial size was moderately dilated.  7. Right atrial size was normal.  8. Mild mitral annular calcification.  9. The mitral valve  is degenerative. Mild mitral valve regurgitation.  10. Teathered and restricted movement of the PMVL consistent with IIIb  pathology. Mild mitral regurgitation.  11. The tricuspid valve is grossly normal. Tricuspid valve regurgitation  is trivial.  12. The aortic valve is tricuspid. Aortic valve regurgitation is not  visualized. Mild aortic valve stenosis.  13. There is Severe calcifcation of the aortic valve.  14. There is Severely thickening of the aortic valve.  15. The pulmonic valve was grossly normal. Pulmonic valve regurgitation is  not visualized.  16. Moderately elevated pulmonary artery systolic pressure.  17. The tricuspid regurgitant velocity is 3.25 m/s, and with an assumed  right atrial pressure of 8 mmHg, the estimated right ventricular systolic  pressure is moderately elevated at 50.3 mmHg.  18. The inferior vena cava is normal in size with <50% respiratory  variability, suggesting right atrial pressure of 8 mmHg.   EKG:  EKG is ordered today.  The ekg ordered today demonstrates atrial fibrillation, right bundle branch block, left anterior fascicular block.  Recent Labs: 07/23/2018: B Natriuretic Peptide 1,122.4 07/24/2018: TSH 0.857 10/10/2018: Magnesium 2.1 10/13/2018: Platelets 130 12/07/2018: ALT 17 05/10/2019: BUN 29; Creatinine, Ser 1.70; Hemoglobin 9.2; Potassium 3.5; Sodium 139  Recent Lipid Panel    Component Value Date/Time   CHOL 143 05/25/2018 1027   TRIG 84 05/25/2018 1027   HDL 63 05/25/2018 1027   CHOLHDL 2.3 05/25/2018 1027   CHOLHDL 2.6 08/01/2014 0852   VLDL 18 08/01/2014 0852   LDLCALC 63 05/25/2018 1027    Physical Exam:    VS:  BP 124/69   Pulse 93  Temp (!) 97.2 F (36.2 C) Comment: Forehead  Ht 5\' 5"  (1.651 m)   Wt 124 lb (56.2 kg)   SpO2 98%   BMI 20.63 kg/m     Wt Readings from Last 3 Encounters:  07/05/19 124 lb (56.2 kg)  06/20/19 125 lb (56.7 kg)  05/10/19 136 lb (61.7 kg)     GEN: Well nourished, well developed in no acute  distress HEENT: Normal NECK: No JVD; No carotid bruits LYMPHATICS: No lymphadenopathy CARDIAC: Irregularly irregular, no murmurs, rubs, gallops RESPIRATORY:  Clear to auscultation without rales, wheezing or rhonchi  ABDOMEN: Soft, non-tender, non-distended MUSCULOSKELETAL:  No edema; No deformity  SKIN: Warm and dry NEUROLOGIC:  Alert and oriented x 3 PSYCHIATRIC:  Normal affect   ASSESSMENT:    1. Pre-procedural cardiovascular examination   2. Aortic valve stenosis, etiology of cardiac valve disease unspecified   3. Coronary artery disease involving coronary bypass graft of native heart without angina pectoris   4. PAD (peripheral artery disease) ([place])   5. Bilateral carotid artery stenosis   6. Renal artery stenosis ([place])   7. Essential hypertension   8. Hyperlipidemia LDL goal <70   9. Persistent atrial fibrillation (HCC)    PLAN:    In order of problems listed above:  1. Preoperative clearance: Upcoming vascular surgery.  He has significant deconditioning in the past year, his cardiac history also make him at least moderate if not a high risk patient for such procedure.  I recommended Myoview to risk stratify him  2. Aortic stenosis: Seen on the previous echocardiogram.  I recommend a repeat echocardiogram to reevaluate the degree of aortic stenosis  3. CAD s/p CABG: Denies any recent chest pain.  Continue aspirin  4. PAD: Followed by vascular surgery.  He has significant peripheral arterial disease including carotid, lower extremity and subclavian artery stenosis  5. Carotid artery disease: History of right CEA  6. Renal artery stenosis: History of right renal artery stenting  7. Hypertension: Blood pressure stable  8. Hyperlipidemia: On Zetia  9. Persistent atrial fibrillation: Stays in atrial fibrillation at this point.  Rate controlled on diltiazem.  He is not a candidate for anticoagulation therapy given high bleeding risk and recurrent severe  anemia.   Medication Adjustments/Labs and Tests Ordered: Current medicines are reviewed at length with the patient today.  Concerns regarding medicines are outlined above.  Orders Placed This Encounter  Procedures  . MYOCARDIAL PERFUSION IMAGING  . EKG 12-Lead  . ECHOCARDIOGRAM COMPLETE   Meds ordered this encounter  Medications  . ezetimibe (ZETIA) 10 MG tablet    Sig: TAKE 1 TABLET (10 MG TOTAL) BY MOUTH DAILY.    Dispense:  90 tablet    Refill:  0    Patient Instructions  Medication Instructions:  Your physician recommends that you continue on your current medications as directed. Please refer to the Current Medication list given to you today.  *If you need a refill on your cardiac medications before your next appointment, please call your pharmacy*   Lab Work: NONE ordered at this time of appointment   If you have labs (blood work) drawn today and your tests are completely normal, you will receive your results only by: Marland Kitchen MyChart Message (if you have MyChart) OR . A paper copy in the mail If you have any lab test that is abnormal or we need to change your treatment, we will call you to review the results.   Testing/Procedures: Your physician has requested that  you have an echocardiogram. Echocardiography is a painless test that uses sound waves to create images of your heart. It provides your doctor with information about the size and shape of your heart and how well your heart's chambers and valves are working. This procedure takes approximately one hour. There are no restrictions for this procedure.  Your physician has requested that you have a lexiscan myoview. For further information please visit [email or web address]. Please follow instruction sheet, as given.   Please schedule both for 07/09/19 or 07/10/19   Follow-Up: At [facility], you and your health needs are our priority.  As part of our continuing mission to provide you with exceptional heart care, we  have created designated Provider Care Teams.  These Care Teams include your primary Cardiologist (physician) and Advanced Practice Providers (APPs -  Physician Assistants and Nurse Practitioners) who all work together to provide you with the care you need, when you need it.  Your next appointment:   6-8 week(s)  The format for your next appointment:   In Person  Provider:   Shelva Majestic, MD  Other Instructions      Signed, Almyra Deforest, [place]  07/07/2019 10:46 PM    [place]

## 2019-07-05 NOTE — Telephone Encounter (Signed)

## 2019-07-07 ENCOUNTER — Encounter: Payer: Self-pay | Admitting: Physician Assistant

## 2019-07-09 ENCOUNTER — Ambulatory Visit (HOSPITAL_COMMUNITY): Payer: Medicare PPO | Attending: Cardiology

## 2019-07-09 ENCOUNTER — Other Ambulatory Visit: Payer: Self-pay

## 2019-07-09 ENCOUNTER — Ambulatory Visit (HOSPITAL_BASED_OUTPATIENT_CLINIC_OR_DEPARTMENT_OTHER): Payer: Medicare PPO

## 2019-07-09 DIAGNOSIS — I350 Nonrheumatic aortic (valve) stenosis: Secondary | ICD-10-CM | POA: Insufficient documentation

## 2019-07-09 DIAGNOSIS — Z01810 Encounter for preprocedural cardiovascular examination: Secondary | ICD-10-CM | POA: Insufficient documentation

## 2019-07-09 LAB — MYOCARDIAL PERFUSION IMAGING
LV dias vol: 128 mL (ref 62–150)
LV sys vol: 67 mL
Peak HR: 96 {beats}/min
Rest HR: 85 {beats}/min
SDS: 7
SRS: 0
SSS: 7
TID: 0.99

## 2019-07-09 MED ORDER — REGADENOSON 0.4 MG/5ML IV SOLN
0.4000 mg | Freq: Once | INTRAVENOUS | Status: AC
  Administered 2019-07-09: 0.4 mg via INTRAVENOUS

## 2019-07-09 MED ORDER — TECHNETIUM TC 99M TETROFOSMIN IV KIT
31.2000 | PACK | Freq: Once | INTRAVENOUS | Status: AC | PRN
  Administered 2019-07-09: 31.2 via INTRAVENOUS
  Filled 2019-07-09: qty 32

## 2019-07-09 MED ORDER — TECHNETIUM TC 99M TETROFOSMIN IV KIT
10.2000 | PACK | Freq: Once | INTRAVENOUS | Status: AC | PRN
  Administered 2019-07-09: 10.2 via INTRAVENOUS
  Filled 2019-07-09: qty 11

## 2019-07-10 NOTE — Progress Notes (Signed)
Overall reassuring low risk study with area of previous infarct but very mild peri-infarct ischemia, patient is cleared to proceed with vascular procedure

## 2019-07-10 NOTE — Progress Notes (Signed)
[id number] - [place], [address]  [address] Phone: [phone] Fax: [phone]      Your procedure is scheduled on Jul 14, 2008.  Report to [facility] Main Entrance "A" at 5:30 A.M., and check in at the Admitting office.  Call this number if you have problems the morning of surgery:  [phone]  Call [phone] if you have any questions prior to your surgery date Monday-Friday 8am-4pm    Remember:  Do not eat or drink after midnight the night before your surgery     Take these medicines the morning of surgery with A SIP OF WATER: diltiazem (CARDIZEM CD) dutasteride (AVODART) esomeprazole (NEXIUM) ezetimibe (ZETIA) ranolazine (RANEXA)  Gabapentin Enacarbil (HORIZANT) - as needed for restless legs loratadine (CLARITIN) - as needed for allergies nitroGLYCERIN (NITROLINGUAL) - as needed for chest pain  As of today, STOP taking any Aspirin (unless otherwise instructed by your surgeon) and Aspirin containing products, Aleve, Naproxen, Ibuprofen, Motrin, Advil, Goody's, BC's, all herbal medications, fish oil, and all vitamins.                      Do not wear jewelry            Do not wear lotions, powders, colognes, or deodorant.            Do not shave 48 hours prior to surgery.  Men may shave face and neck.            Do not bring valuables to the hospital.            [facility] is not responsible for any belongings or valuables.  Do NOT Smoke (Tobacco/Vapping) or drink Alcohol 24 hours prior to your procedure If you use a CPAP at night, you may bring all equipment for your overnight stay.   Contacts, glasses, dentures or bridgework may not be worn into surgery.      For patients admitted to the hospital, discharge time will be determined by your treatment team.   Patients discharged the day of surgery will not be allowed to drive home, and someone needs to stay with them for 24  hours.    Special instructions:   [place]- Preparing For Surgery  Before surgery, you can play an important role. Because skin is not sterile, your skin needs to be as free of germs as possible. You can reduce the number of germs on your skin by washing with CHG (chlorahexidine gluconate) Soap before surgery.  CHG is an antiseptic cleaner which kills germs and bonds with the skin to continue killing germs even after washing.    Oral Hygiene is also important to reduce your risk of infection.  Remember - BRUSH YOUR TEETH THE MORNING OF SURGERY WITH YOUR REGULAR TOOTHPASTE  Please do not use if you have an allergy to CHG or antibacterial soaps. If your skin becomes reddened/irritated stop using the CHG.  Do not shave (including legs and underarms) for at least 48 hours prior to first CHG shower. It is OK to shave your face.  Please follow these instructions carefully.   1. Shower the NIGHT BEFORE SURGERY and the MORNING OF SURGERY with CHG Soap.   2. If you chose to wash your hair, wash your hair first as usual with your normal shampoo.  3. After you shampoo, rinse your hair and body thoroughly to remove the shampoo.  4. Use  CHG as you would any other liquid soap. You can apply CHG directly to the skin and wash gently with a scrungie or a clean washcloth.   5. Apply the CHG Soap to your body ONLY FROM THE NECK DOWN.  Do not use on open wounds or open sores. Avoid contact with your eyes, ears, mouth and genitals (private parts). Wash Face and genitals (private parts)  with your normal soap.   6. Wash thoroughly, paying special attention to the area where your surgery will be performed.  7. Thoroughly rinse your body with warm water from the neck down.  8. DO NOT shower/wash with your normal soap after using and rinsing off the CHG Soap.  9. Pat yourself dry with a CLEAN TOWEL.  10. Wear CLEAN PAJAMAS to bed the night before surgery, wear comfortable clothes the morning of  surgery  11. Place CLEAN SHEETS on your bed the night of your first shower and DO NOT SLEEP WITH PETS.   Day of Surgery:   Do not apply any deodorants/lotions.  Please wear clean clothes to the hospital/surgery center.   Remember to brush your teeth WITH YOUR REGULAR TOOTHPASTE.   Please read over the following fact sheets that you were given.

## 2019-07-10 NOTE — Progress Notes (Signed)
Normal pumping function of heart, previously seen tight aortic valve is actually mild on this study. This study is reassuring

## 2019-07-11 ENCOUNTER — Encounter (HOSPITAL_COMMUNITY): Payer: Self-pay

## 2019-07-11 ENCOUNTER — Encounter (HOSPITAL_COMMUNITY)
Admission: RE | Admit: 2019-07-11 | Discharge: 2019-07-11 | Disposition: A | Discharge status: Home / Self Care | Payer: Medicare PPO | Source: Ambulatory Visit | Attending: Vascular Surgery | Admitting: Vascular Surgery

## 2019-07-11 ENCOUNTER — Other Ambulatory Visit: Payer: Self-pay

## 2019-07-11 ENCOUNTER — Other Ambulatory Visit (HOSPITAL_COMMUNITY)
Admission: RE | Admit: 2019-07-11 | Discharge: 2019-07-11 | Disposition: A | Discharge status: Home / Self Care | Payer: Medicare PPO | Source: Ambulatory Visit | Attending: Vascular Surgery | Admitting: Vascular Surgery

## 2019-07-11 DIAGNOSIS — Z01812 Encounter for preprocedural laboratory examination: Secondary | ICD-10-CM | POA: Insufficient documentation

## 2019-07-11 DIAGNOSIS — Z20822 Contact with and (suspected) exposure to covid-19: Secondary | ICD-10-CM | POA: Insufficient documentation

## 2019-07-11 HISTORY — DX: Heart failure, unspecified: I50.9

## 2019-07-11 HISTORY — DX: Cardiac murmur, unspecified: R01.1

## 2019-07-11 HISTORY — DX: Essential (primary) hypertension: I10

## 2019-07-11 HISTORY — DX: Chronic kidney disease, unspecified: N18.9

## 2019-07-11 HISTORY — DX: Nonrheumatic aortic (valve) stenosis: I35.0

## 2019-07-11 LAB — PROTIME-INR
INR: 1.1 (ref 0.8–1.2)
Prothrombin Time: 13.3 seconds (ref 11.4–15.2)

## 2019-07-11 LAB — SARS CORONAVIRUS 2 (TAT 6-24 HRS): SARS Coronavirus 2: NEGATIVE

## 2019-07-11 LAB — CBC
HCT: 40 % (ref 39.0–52.0)
Hemoglobin: 13 g/dL (ref 13.0–17.0)
MCH: 32.3 pg (ref 26.0–34.0)
MCHC: 32.5 g/dL (ref 30.0–36.0)
MCV: 99.5 fL (ref 80.0–100.0)
Platelets: 154 10*3/uL (ref 150–400)
RBC: 4.02 MIL/uL — ABNORMAL LOW (ref 4.22–5.81)
RDW: 16.4 % — ABNORMAL HIGH (ref 11.5–15.5)
WBC: 4.8 10*3/uL (ref 4.0–10.5)
nRBC: 0 % (ref 0.0–0.2)

## 2019-07-11 LAB — COMPREHENSIVE METABOLIC PANEL
ALT: 14 U/L (ref 0–44)
AST: 19 U/L (ref 15–41)
Albumin: 3.6 g/dL (ref 3.5–5.0)
Alkaline Phosphatase: 92 U/L (ref 38–126)
Anion gap: 13 (ref 5–15)
BUN: 26 mg/dL — ABNORMAL HIGH (ref 8–23)
CO2: 27 mmol/L (ref 22–32)
Calcium: 9.5 mg/dL (ref 8.9–10.3)
Chloride: 99 mmol/L (ref 98–111)
Creatinine, Ser: 1.66 mg/dL — ABNORMAL HIGH (ref 0.61–1.24)
GFR calc Af Amer: 46 mL/min — ABNORMAL LOW (ref >60)
GFR calc non Af Amer: 40 mL/min — ABNORMAL LOW (ref >60)
Glucose, Bld: 93 mg/dL (ref 70–99)
Potassium: 3.8 mmol/L (ref 3.5–5.1)
Sodium: 139 mmol/L (ref 135–145)
Total Bilirubin: 0.8 mg/dL (ref 0.3–1.2)
Total Protein: 7.3 g/dL (ref 6.5–8.1)

## 2019-07-11 LAB — URINALYSIS, ROUTINE W REFLEX MICROSCOPIC
Bilirubin Urine: NEGATIVE
Glucose, UA: NEGATIVE mg/dL
Hgb urine dipstick: NEGATIVE
Ketones, ur: NEGATIVE mg/dL
Leukocytes,Ua: NEGATIVE
Nitrite: NEGATIVE
Protein, ur: NEGATIVE mg/dL
Specific Gravity, Urine: 1.012 (ref 1.005–1.030)
pH: 5 (ref 5.0–8.0)

## 2019-07-11 LAB — SURGICAL PCR SCREEN
MRSA, PCR: NEGATIVE
Staphylococcus aureus: NEGATIVE

## 2019-07-11 LAB — APTT: aPTT: 38 seconds — ABNORMAL HIGH (ref 24–36)

## 2019-07-11 NOTE — Progress Notes (Signed)
[id number] - [place], [place] [address] [place] [address] Phone: [phone] Fax: [phone]     Your procedure is scheduled on Jul 14, 2008.  Report to [facility] Main Entrance "A" at 5:30 A.M., and check in at the Admitting office.  Call this number if you have problems the morning of surgery:  [phone]  Call [phone] if you have any questions prior to your surgery date Monday-Friday 8am-4pm    Remember:  Do not eat or drink after midnight the night before your surgery.     Take these medicines the morning of surgery with A SIP OF WATER: diltiazem (CARDIZEM CD) dutasteride (AVODART) esomeprazole (NEXIUM) ezetimibe (ZETIA) ranolazine (RANEXA)  Gabapentin Enacarbil (HORIZANT) - as needed for restless legs loratadine (CLARITIN) - as needed for allergies nitroGLYCERIN (NITROLINGUAL) - as needed for chest pain  Follow your surgeon's instructions on when to stop Aspirin.  If no instructions were given by your surgeon then you will need to call the office to get those instructions.    As of today, STOP taking any Aspirin containing products, Aleve, Naproxen, Ibuprofen, Motrin, Advil, Goody's, BC's, all herbal medications, fish oil, and all vitamins.                     Do not wear jewelry            Do not wear lotions, powders, colognes, or deodorant.            Men may shave face and neck.            Do not bring valuables to the hospital.            [facility] is not responsible for any belongings or valuables.  Do NOT Smoke (Tobacco/Vapping) or drink Alcohol 24 hours prior to your procedure.  If you use a CPAP at night, you may bring all equipment for your overnight stay.   Contacts, glasses, dentures or bridgework may not be worn into surgery.      For patients admitted to the hospital, discharge time will be determined by your treatment team.   Patients discharged the day of surgery will not be  allowed to drive home, and someone needs to stay with them for 24 hours.    Special instructions:   [place]- Preparing For Surgery  Before surgery, you can play an important role. Because skin is not sterile, your skin needs to be as free of germs as possible. You can reduce the number of germs on your skin by washing with CHG (chlorahexidine gluconate) Soap before surgery.  CHG is an antiseptic cleaner which kills germs and bonds with the skin to continue killing germs even after washing.    Oral Hygiene is also important to reduce your risk of infection.  Remember - BRUSH YOUR TEETH THE MORNING OF SURGERY WITH YOUR REGULAR TOOTHPASTE  Please do not use if you have an allergy to CHG or antibacterial soaps. If your skin becomes reddened/irritated stop using the CHG.  Do not shave (including legs and underarms) for at least 48 hours prior to first CHG shower. It is OK to shave your face.  Please follow these instructions carefully.   1. Shower the NIGHT BEFORE SURGERY and the MORNING OF SURGERY with CHG Soap.   2. If you chose to wash your hair, wash your hair first as usual with your normal shampoo.  3. After  you shampoo, rinse your hair and body thoroughly to remove the shampoo.  4. Use CHG as you would any other liquid soap. You can apply CHG directly to the skin and wash gently with a scrungie or a clean washcloth.   5. Apply the CHG Soap to your body ONLY FROM THE NECK DOWN.  Do not use on open wounds or open sores. Avoid contact with your eyes, ears, mouth and genitals (private parts). Wash Face and genitals (private parts)  with your normal soap.   6. Wash thoroughly, paying special attention to the area where your surgery will be performed.  7. Thoroughly rinse your body with warm water from the neck down.  8. DO NOT shower/wash with your normal soap after using and rinsing off the CHG Soap.  9. Pat yourself dry with a CLEAN TOWEL.  10. Wear CLEAN PAJAMAS to bed the night  before surgery, wear comfortable clothes the morning of surgery  11. Place CLEAN SHEETS on your bed the night of your first shower and DO NOT SLEEP WITH PETS.   Day of Surgery:   Do not apply any deodorants/lotions.  Please wear clean clothes to the hospital/surgery center.   Remember to brush your teeth WITH YOUR REGULAR TOOTHPASTE.   Please read over the following fact sheets that you were given.

## 2019-07-11 NOTE — Progress Notes (Signed)
PCP - Garwin Brothers, MD Cardiologist - Shelva Majestic, MD  PPM/ICD - Denies  Chest x-ray - N/A EKG - 07/05/19 Stress Test - 07/09/19 ECHO - 07/09/19 Cardiac Cath - 01/24/11  Sleep Study - Denies   Patient denies being a diabetic.  Blood Thinner Instructions: N/A Aspirin Instructions: Per Dr. Oneida Alar' office, continue taking ASA.  ERAS Protcol - N/A PRE-SURGERY Ensure or G2- N/A  COVID TEST- 07/11/19, Pending  Anesthesia review: Yes, cardiac hx.  Patient denies shortness of breath, fever, cough and chest pain at PAT appointment   All instructions explained to the patient, with a verbal understanding of the material. Patient agrees to go over the instructions while at home for a better understanding. Patient also instructed to self quarantine after being tested for COVID-19. The opportunity to ask questions was provided.

## 2019-07-12 ENCOUNTER — Encounter (HOSPITAL_COMMUNITY): Payer: Self-pay

## 2019-07-12 NOTE — Progress Notes (Signed)
Anesthesia Chart Review:  Case: [id number] Date/Time: 07/15/19 0715   Procedures:      RIGHT GROIN EXPLORATION (Right )     BYPASS GRAFT FEMORAL-FEMORAL ARTERY (Bilateral )     ENDARTERECTOMY FEMORAL (Right )   Anesthesia type: General   Pre-op diagnosis: PERIPHERAL ARTERY DISEASE   Location: [facility] ROOM [facility] / [facility]   Surgeons: Elam Dutch, MD      DISCUSSION: Patient is a 74 year old male scheduled for the above procedure.  Patient with severe PAD and limited revascularization options.  He has rest pain and early gangrenous changes to his right foot. Both vascular surgery and interventional radiology did not see good percutaneous options, and he was not felt to be candidate for aorto bifemoral bypass graft due to his significant comorbidities "overall debilitated state".  Surgical option as outlined by Dr. Oneida Alar', "...plan would be to explore his right groin try to do a right femoral endarterectomy and if we could obtain a target do a left to right femoral-femoral bypass.  Otherwise he would undergo a right axillary to popliteal bypass with most likely limited durability but otherwise no real options." He obtained preoperative cardiac risk assessment by cardiology (see below).  History includes former smoker (quit 12/6/6), CAD (angina history; MI 54 & 2005; NSTEMI 07/2018 in setting of PNA; CABG 1984 [facility], redo CABG 1996, Dr. Redmond Pulling, stent native RCA 1997; DES LAD 08/05/03; DES x2 SVG-OM graft 12/11/03; PTCA LAD and DES RCA 12/16/03; DES x2 SVG-OM graft 01/24/11), afib (s/p DCCV 04/17/17; no longer on Eliquis due to recurrent severe anemia), murmur (aortic stenosis, mild 07/2019), RBBB, chronic diastolic CHF, HTN, PAD (bilateral SFA occlusion, left SCA stenosis), renal artery stenosis (s/p right RA stent 06/11/98 with cutting balloon atherectomy/PTA 08/05/03 & 10/605 for ISR), chronic mesenteric ischemia (s/p celiac artery and SMA stents 07/16/18), carotid artery stenosis (s/p right carotid  endarterectomy 1996; 40-59% BICA stenosis 10/2018), CKD, esophageal stenosis (s/p multiple dilatations, last 10/14/18), GERD, hiatal hernia, anemia (s/p PRBC 07/2018, 10/2018), GI bleed (PUD with pyloric channel ulcer and cecal AVM in 2016 requiring thermal therapy), chronic back pain, dyspnea, bilateral pleural effusions (in setting of GI bleed/anemia, acute on chronic CHF exacerbation/AKI s/p right thoracentesis 10/12/18, left 10/13/18).   Following recent evaluation on 07/05/19 with Almyra Deforest, [place] wrote, "He has significant deconditioning in the past year, his cardiac history also make him at least moderate if not a high risk patient for such procedure.  I recommended Myoview to risk stratify him. Following stress test and echo, he added, "Overall reassuring low risk study with area of previous infarct but very mild peri-infarct ischemia, patient is cleared to proceed with vascular procedure".  Per [facility], continue ASA perioperatively. 07/11/19 presurgical COVID-19 test negative.  Anesthesia team to evaluate on the day of surgery.  Type and screen specimen was + antibodies, so repeat T&S planned for day of surgery.    VS: BP 129/64   Pulse 76   Temp 36.7 C (Oral)   Resp 20   Wt 56.7 kg   SpO2 100%   BMI 20.78 kg/m     PROVIDERS: Garwin Brothers, MD is PCP  - Shelva Majestic, MD is cardiologist. Last evaluation 07/05/19 with Almyra Deforest, [place], Will, MD is EP cardiologist. Last evaluation 03/09/18. In permanent afib at that time. He was not felt to be a good candidate for ablation given his significant co-morbidities including vascular and cardiac disease.  - Quay Burow, MD is [facility] cardiologist  LABS: Cr 1.66, down from 1.70 on 05/10/19 (Cr range 1.30-1.93 since 11/2018)  (all labs ordered are listed, but only abnormal results are displayed)  Labs Reviewed  APTT - Abnormal; Notable for the following components:      Result Value   aPTT 38 (*)    All other components within normal limits  CBC -  Abnormal; Notable for the following components:   RBC 4.02 (*)    RDW 16.4 (*)    All other components within normal limits  COMPREHENSIVE METABOLIC PANEL - Abnormal; Notable for the following components:   BUN 26 (*)    Creatinine, Ser 1.66 (*)    GFR calc non Af Amer 40 (*)    GFR calc Af Amer 46 (*)    All other components within normal limits  SURGICAL PCR SCREEN  PROTIME-INR  URINALYSIS, ROUTINE W REFLEX MICROSCOPIC  TYPE AND SCREEN     IMAGES: 1V CXR (post left thoracentesis) 10/13/18: IMPRESSION: 1. No pneumothorax.  No residual left pleural effusion. 2. Stable small right pleural effusion. 3. Emphysema. 4. Mild right basilar scarring versus atelectasis. 5. Cardiomegaly.   EKG: 07/05/19 ([facility]):  Afib at 93 bpm Right BBB Left posterior fascicular block Bifascicular block   CV: Nuclear stress test 07/09/19:  Nuclear stress EF: 48%.  There was no ST segment deviation noted during stress.  Defect 1: There is a large defect of severe severity present in the basal inferior, mid inferior and apical inferior location.  Findings consistent with prior myocardial infarction with peri-infarct ischemia.  This is a low risk study.  The left ventricular ejection fraction is mildly decreased (45-54%). - Abnormal, low risk stress nuclear study with prior inferior infarct and mild peri-infarct ischemia.  Gated ejection fraction 48% with inferior hypokinesis and mild left ventricular enlargement.   Echo 07/09/19: IMPRESSIONS  1. Left ventricular ejection fraction, by estimation, is 50 to 55%. The  left ventricle has low normal function. The left ventricle has no regional  wall motion abnormalities. There is mild left ventricular hypertrophy.  Left ventricular diastolic  parameters are indeterminate.  2. Right ventricular systolic function is normal. The right ventricular  size is normal. There is mildly elevated pulmonary artery systolic  pressure.  3. Left  atrial size was mildly dilated.  4. The mitral valve is normal in structure. Trivial mitral valve  regurgitation. No evidence of mitral stenosis.  5. The aortic valve is normal in structure. Aortic valve regurgitation is  not visualized. Mild aortic valve stenosis. Aortic valve mean gradient  measures 10.8 mmHg. Aortic valve Vmax measures 2.07 m/s.  6. The inferior vena cava is dilated in size with <50% respiratory  variability, suggesting right atrial pressure of 15 mmHg.  - Comparison(s): No significant change from prior study. Prior images  reviewed side by side.    Abdominal Aortogram with BLE runoff 05/10/19: Findings:  1.  Occlusion right external iliac artery 2.  Occlusion right common femoral artery profunda femoris and superficial femoral artery 3.  Occlusion left profunda femoris artery left superficial femoral artery 4.  Very diseased reconstitution right below-knee popliteal with peroneal runoff 5.  Reconstitution above-knee popliteal artery left leg with three-vessel runoff to the left foot 6.  In-stent restenosis right renal artery stent 50%   Carotid US 10/26/18: Summary:  Right Carotid: Velocities in the right ICA are consistent with a 40-59%         stenosis. The ECA appears <50% stenosed.  Left Carotid: Velocities in the left  ICA are consistent with a 40-59%  stenosis.        The ECA appears <50% stenosed.  Vertebrals: Right vertebral artery demonstrates antegrade flow. Left  vertebral        artery demonstrates retrograde flow.  Subclavians: Bilateral subclavian arteries were stenotic.    24-48 hour Holter monitor 06/17/16: Study Highlights: The predominant rhythm is atrial fibrillation.  The average heart rate is 82 bpm with a minimum heart rate of 50 to a maximum heart rate of 119 bpm.  There are frequent PVCs with several episodes of bigeminy and trigeminy and a very rare ventricular couplet.  There were no episodes of ventricular  tachycardia.  There were no prolonged causes.   Cardiac cath/PCI 01/24/11: IMPRESSION: 1. Significant native coronary artery disease with evidence for a     patent left main stent apparently inserted in 1998 by Dr.     Rollene Fare. 2. Occluded circumflex marginal vessels from the atrioventricular     groove circumflex. 3. High-grade 80% ostial right coronary artery stenosis with 90%     stenosis in the mid right coronary artery proximal to the right     ventricular marginal branch and total occlusion of the mid right     coronary artery. 4. Occluded vein graft supplying the right coronary artery, but     evidence for collateralization to the distal right coronary artery     via the left coronary injection and the injection of the grafts. 5. Vein graft supplying the circumflex marginal vessel with previously     placed ostial proximal 3.0 x 12 mm Taxus stent with in-stent     narrowing of 50%, followed by 95% stenosis in the body of the     proximal third of the vessel graft prior to the previously placed     mid graft 3.0 x 20 mm Taxus stent. 6. Free right internal mammary artery graft supplying a diagonal     vessel. 7. Occluded left subclavian artery. 8. Right renal artery stent question patency with evidence for diffuse     right iliac stenosis.  The patient will be hydrated following the     catheterizations with plans for heparinization, increased nitrate     therapy, increased medical therapy and planned percutaneous     coronary intervention to the vein graft to the circumflex vessel. Staged PC 01/24/11: Successful, but difficult complex percutaneous coronary intervention involving long segmental stenosis in the vein graft supplying the circumflex marginal vessel.  The graft which had previously been stented proximally with a 3.0 x 12-mm TAXUS stent in its mid segment 3.0 x 20-mm TAXUS stent, now with additional tandem stents of 3.0 x 38 mm and 3.0 x 8 mm placed with the  segmental 50, 80, and 99% stenoses being reduced to 0%.   Past Medical History:  Diagnosis Date  . Anemia   . Angina   . Aortic stenosis   . Atrial fibrillation ([place])   . Blood transfusion   . Bronchitis   . Bulging discs    "8 of them; thoracic, lumbar, sacral area"  . CHF (congestive heart failure) ([place])   . Chronic back pain   . Chronic kidney disease    "mild kidney disease"  . Colon polyps   . Coronary artery disease 01/24/11   Successful PCI long segmental stensois  vein graft to the CX marginal vessel-graft previously stented prox. 3.0x66mm TAXUS stent mid seg 3.0x94mm TAXUS stent now tandem stens of 3.0x69mm & 3.0x82mm placed  with the seg. 50,80 & 99% stenosis reduced to 0%  . DDD (degenerative disc disease)   . Diastolic heart failure ([place])   . Dysrhythmia    "PAC's and PVC's"  . Esophageal dilatation    "have had it done 7 times; last time ~ 2001"  . Esophageal stricture   . GERD (gastroesophageal reflux disease)   . Headache(784.0)   . Heart murmur   . Hiatal hernia   . Hyperlipidemia   . Hypertension   . Myocardial infarction ([place]) 1991  . Myocardial infarction [facility]) 2005   "had 3 heart attacks this year"  . Paroxysmal atrial fibrillation (H[facility])   . Pneumonia 2020  . Pyloric stenosis   . RBBB   . Renal artery stenosis ([facility])    hhistory of right renal artery stenting and re-intervention for "in-stent restenosis.  . S/P CABG (coronary artery bypass graft) [place]  . Shortness of breath    "when I had the heart attacks"  . Subclavian artery stenosis, left ([place])   . Ulcer    "small; Dr. Henrene Pastor found it 02/2010"    Past Surgical History:  Procedure Laterality Date  . ABDOMINAL AORTOGRAM W/LOWER EXTREMITY N/A 05/10/2019   Procedure: ABDOMINAL AORTOGRAM W/LOWER EXTREMITY;  Surgeon: Elam Dutch, MD;  Location: [place] CV LAB;  Service: Cardiovascular;  Laterality: N/A;  . ADENOIDECTOMY     "when I was an infant"   . APPENDECTOMY  1953  . BALLOON DILATION N/A 10/14/2018   Procedure: BALLOON DILATION;  Surgeon: Lavena Bullion, DO;  Location: [place];  Service: Gastroenterology;  Laterality: N/A;  . BIOPSY  07/26/2018   Procedure: BIOPSY;  Surgeon: Thornton Park, MD;  Location: [place];  Service: Gastroenterology;;  . BIOPSY  10/14/2018   Procedure: BIOPSY;  Surgeon: Lavena Bullion, DO;  Location: [place];  Service: Gastroenterology;;  . CARDIOVERSION N/A 04/17/2017   Procedure: CARDIOVERSION;  Surgeon: Jerline Pain, MD;  Location: [facility] ENDOSCOPY;  Service: Cardiovascular;  Laterality: N/A;  . CAROTID ENDARTERECTOMY Right 1996   CE  . COLONOSCOPY WITH PROPOFOL N/A 10/14/2018   Procedure: COLONOSCOPY WITH PROPOFOL;  Surgeon: Lavena Bullion, DO;  Location: [place];  Service: Gastroenterology;  Laterality: N/A;  . CORONARY ANGIOPLASTY WITH STENT PLACEMENT  01/24/11   "today makes a total of 9 stents"  . CORONARY ARTERY BYPASS GRAFT  1984   CABG X 4  . CORONARY ARTERY BYPASS GRAFT  1996   CABG X 4  . ESOPHAGOGASTRODUODENOSCOPY (EGD) WITH PROPOFOL N/A 07/26/2018   Procedure: ESOPHAGOGASTRODUODENOSCOPY (EGD) WITH PROPOFOL;  Surgeon: Thornton Park, MD;  Location: [place];  Service: Gastroenterology;  Laterality: N/A;  . ESOPHAGOGASTRODUODENOSCOPY (EGD) WITH PROPOFOL N/A 10/14/2018   Procedure: ESOPHAGOGASTRODUODENOSCOPY (EGD) WITH PROPOFOL;  Surgeon: Lavena Bullion, DO;  Location: [place];  Service: Gastroenterology;  Laterality: N/A;  . EYE SURGERY  2009   detached retina in right eye  . FRACTURE SURGERY  07/03/2018   fractured right hip sx.  . IR THORACENTESIS ASP PLEURAL SPACE W/IMG GUIDE  10/12/2018  . LEFT HEART CATHETERIZATION WITH CORONARY/GRAFT ANGIOGRAM N/A 01/24/2011   Procedure: LEFT HEART CATHETERIZATION WITH Beatrix Fetters;  Surgeon: Troy Sine, MD;  Location: [facility] CATH LAB;  Service: Cardiovascular;  Laterality: N/A;  . PERIPHERAL  VASCULAR INTERVENTION  07/16/2018   Procedure: PERIPHERAL VASCULAR INTERVENTION;  Surgeon: Elam Dutch, MD;  Location: [place] CV LAB;  Service: Cardiovascular;;  SMA and Celiac  .  pylondial cyst removal    . RETINAL DETACHMENT SURGERY  04/2007   right  . TONSILLECTOMY  1972  . VISCERAL ANGIOGRAPHY N/A 07/16/2018   Procedure: VISCERAL ANGIOGRAPHY;  Surgeon: Elam Dutch, MD;  Location: [place] CV LAB;  Service: Cardiovascular;  Laterality: N/A;    MEDICATIONS: . aspirin EC 81 MG tablet  . butalbital-aspirin-caffeine (FIORINAL) 50-325-40 MG capsule  . calcium carbonate (TUMS - DOSED IN MG ELEMENTAL CALCIUM) 500 MG chewable tablet  . diltiazem (CARDIZEM CD) 120 MG 24 hr capsule  . dutasteride (AVODART) 0.5 MG capsule  . esomeprazole (NEXIUM) 40 MG capsule  . ezetimibe (ZETIA) 10 MG tablet  . Gabapentin Enacarbil (HORIZANT) 600 MG TBCR  . Ketotifen Fumarate (ALAWAY OP)  . loratadine (CLARITIN) 10 MG tablet  . lovastatin (MEVACOR) 40 MG tablet  . metolazone (ZAROXOLYN) 2.5 MG tablet  . Multiple Vitamins-Minerals (MULTIVITAMINS THER. W/MINERALS) TABS  . nitroGLYCERIN (NITROLINGUAL) 0.4 MG/SPRAY spray  . Potassium Chloride ER 20 MEQ TBCR  . ranolazine (RANEXA) 500 MG 12 hr tablet  . senna-docusate (SENOKOT-S) 8.6-50 MG tablet  . sucralfate (CARAFATE) 1 GM/10ML suspension  . torsemide (DEMADEX) 20 MG tablet  . vitamin E 200 UNIT capsule   No current facility-administered medications for this encounter.    Myra Gianotti, PA-C Surgical Short Stay/Anesthesiology [facility] Phone [phone] [facility] Phone [phone] 07/12/2019 12:10 PM

## 2019-07-12 NOTE — Anesthesia Preprocedure Evaluation (Addendum)
Anesthesia Evaluation  Patient identified by MRN, date of birth, ID band Patient awake    Reviewed: Allergy & Precautions, NPO status , Patient's Chart, lab work & pertinent test results  Airway Mallampati: II  TM Distance: >3 FB Neck ROM: Full    Dental  (+) Edentulous Upper, Missing, Dental Advisory Given,    Pulmonary neg pulmonary ROS, former smoker,    Pulmonary exam normal breath sounds clear to auscultation       Cardiovascular hypertension, + angina + CAD, + Past MI, + Cardiac Stents, + CABG, + Peripheral Vascular Disease and +CHF  Normal cardiovascular exam+ dysrhythmias Atrial Fibrillation  Rhythm:Regular Rate:Normal  EKG: 07/05/19 ([facility]):  Afib at 93 bpm Right BBB Left posterior fascicular block Bifascicular block  Nuclear stress test 07/09/19: Nuclear stress EF: 48%. There was no ST segment deviation noted during stress. Defect 1: There is a large defect of severe severity present in the basal inferior, mid inferior and apical inferior location. Findings consistent with prior myocardial infarction with peri-infarct ischemia. This is a low risk study. The left ventricular ejection fraction is mildly decreased (45-54%). - Abnormal, low risk stress nuclear study with prior inferior infarct and mild peri-infarct ischemia. Gated ejection fraction 48% with inferior hypokinesis and mild left ventricular enlargement.  Echo 07/09/19: IMPRESSIONS  1. Left ventricular ejection fraction, by estimation, is 50 to 55%. The left ventricle has low normal function. The left ventricle has no regional wall motion abnormalities. There is mild left ventricular hypertrophy. Left ventricular diastolic parameters are indeterminate.  2. Right ventricular systolic function is normal. The right ventricular size is normal. There is mildly elevated pulmonary artery systolic pressure.  3. Left atrial size was mildly dilated.  4.  The mitral valve is normal in structure. Trivial mitral valve regurgitation. No evidence of mitral stenosis.  5. The aortic valve is normal in structure. Aortic valve regurgitation is not visualized. Mild aortic valve stenosis. Aortic valve mean gradient measures 10.8 mmHg. Aortic valve Vmax measures 2.07 m/s.  6. The inferior vena cava is dilated in size with <50% respiratory variability, suggesting right atrial pressure of 15 mmHg.  - Comparison(s): No significant change from prior study. Prior images reviewed side by side.   Abdominal Aortogram with BLE runoff 05/10/19: Findings:  1. Occlusion right external iliac artery 2. Occlusion right common femoral artery profunda femoris and superficial femoral artery 3. Occlusion left profunda femoris artery left superficial femoral artery 4. Very diseased reconstitution right below-knee popliteal with peroneal runoff 5. Reconstitution above-knee popliteal artery left leg with three-vessel runoff to the left foot 6. In-stent restenosis right renal artery stent 50%  Carotid US 10/26/18: Summary:  Right Carotid: Velocities in the right ICA are consistent with a 40-59% stenosis. The ECA appears <50% stenosed.  Left Carotid: Velocities in the left ICA are consistent with a 40-59%  stenosis. The ECA appears <50% stenosed.  Vertebrals: Right vertebral artery demonstrates antegrade flow. Left Vertebral artery demonstrates retrograde flow.  Subclavians: Bilateral subclavian arteries were stenotic.   Neuro/Psych  Headaches, negative psych ROS   GI/Hepatic Neg liver ROS, hiatal hernia, PUD, GERD  Medicated,  Endo/Other  negative endocrine ROS  Renal/GU negative Renal ROS  negative genitourinary   Musculoskeletal  (+) Arthritis ,   Abdominal   Peds  Hematology  (+) Blood dyscrasia, anemia , Type and screen specimen was + antibodies, so repeat T&S planned for day of surgery   Anesthesia Other Findings History includes former smoker  (quit 12/6/6), CAD (angina history;  MI 1991 & 2005; NSTEMI 07/2018 in setting of PNA; CABG 1984 [facility], redo CABG 1996, Dr. Redmond Pulling, stent native RCA 1997; DES LAD 08/05/03; DES x2 SVG-OM graft 12/11/03; PTCA LAD and DES RCA 12/16/03; DES x2 SVG-OM graft 01/24/11), afib (s/p DCCV 04/17/17; no longer on Eliquis due to recurrent severe anemia), murmur (aortic stenosis, mild 07/2019), RBBB, chronic diastolic CHF, HTN, PAD (bilateral SFA occlusion, left SCA stenosis), renal artery stenosis (s/p right RA stent 06/11/98 with cutting balloon atherectomy/PTA 08/05/03 & 10/605 for ISR), chronic mesenteric ischemia (s/p celiac artery and SMA stents 07/16/18), carotid artery stenosis (s/p right carotid endarterectomy 1996; 40-59% BICA stenosis 10/2018), CKD, esophageal stenosis (s/p multiple dilatations, last 10/14/18), GERD, hiatal hernia, anemia (s/p PRBC 07/2018, 10/2018), GI bleed (PUD with pyloric channel ulcer and cecal AVM in 2016 requiring thermal therapy), chronic back pain, dyspnea, bilateral pleural effusions (in setting of GI bleed/anemia, acute on chronic CHF exacerbation/AKI s/p right thoracentesis 10/12/18, left 10/13/18)  Reproductive/Obstetrics                          Anesthesia Physical Anesthesia Plan  ASA: IV  Anesthesia Plan: General   Post-op Pain Management:    Induction: Intravenous  PONV Risk Score and Plan: 3 and Dexamethasone, Ondansetron and Treatment may vary due to age or medical condition  Airway Management Planned: Oral ETT  Additional Equipment: Arterial line  Intra-op Plan:   Post-operative Plan: Extubation in OR  Informed Consent: I have reviewed the patients History and Physical, chart, labs and discussed the procedure including the risks, benefits and alternatives for the proposed anesthesia with the patient or authorized representative who has indicated his/her understanding and acceptance.     Dental advisory given  Plan Discussed with: CRNA  Anesthesia  Plan Comments: (. )      Anesthesia Quick Evaluation

## 2019-07-15 ENCOUNTER — Other Ambulatory Visit: Payer: Self-pay

## 2019-07-15 ENCOUNTER — Inpatient Hospital Stay (HOSPITAL_COMMUNITY)
Admission: RE | Admit: 2019-07-15 | Discharge: 2019-07-18 | DRG: 253 | Disposition: A | Discharge status: Home / Self Care | Payer: Medicare PPO | Attending: Vascular Surgery | Admitting: Vascular Surgery

## 2019-07-15 ENCOUNTER — Encounter (HOSPITAL_COMMUNITY): Payer: Self-pay | Admitting: Vascular Surgery

## 2019-07-15 ENCOUNTER — Inpatient Hospital Stay (HOSPITAL_COMMUNITY): Payer: Medicare PPO | Admitting: Anesthesiology

## 2019-07-15 ENCOUNTER — Encounter (HOSPITAL_COMMUNITY)
Admission: RE | Disposition: A | Discharge status: Home / Self Care | Payer: Self-pay | Source: Home / Self Care | Attending: Vascular Surgery

## 2019-07-15 ENCOUNTER — Inpatient Hospital Stay (HOSPITAL_COMMUNITY): Payer: Medicare PPO | Admitting: Vascular Surgery

## 2019-07-15 DIAGNOSIS — E785 Hyperlipidemia, unspecified: Secondary | ICD-10-CM | POA: Diagnosis not present

## 2019-07-15 DIAGNOSIS — I5032 Chronic diastolic (congestive) heart failure: Secondary | ICD-10-CM | POA: Diagnosis present

## 2019-07-15 DIAGNOSIS — I350 Nonrheumatic aortic (valve) stenosis: Secondary | ICD-10-CM | POA: Diagnosis present

## 2019-07-15 DIAGNOSIS — M5124 Other intervertebral disc displacement, thoracic region: Secondary | ICD-10-CM | POA: Diagnosis not present

## 2019-07-15 DIAGNOSIS — I480 Paroxysmal atrial fibrillation: Secondary | ICD-10-CM | POA: Diagnosis present

## 2019-07-15 DIAGNOSIS — I739 Peripheral vascular disease, unspecified: Secondary | ICD-10-CM

## 2019-07-15 DIAGNOSIS — I10 Essential (primary) hypertension: Secondary | ICD-10-CM | POA: Diagnosis not present

## 2019-07-15 DIAGNOSIS — Z955 Presence of coronary angioplasty implant and graft: Secondary | ICD-10-CM | POA: Diagnosis not present

## 2019-07-15 DIAGNOSIS — I130 Hypertensive heart and chronic kidney disease with heart failure and stage 1 through stage 4 chronic kidney disease, or unspecified chronic kidney disease: Secondary | ICD-10-CM | POA: Diagnosis not present

## 2019-07-15 DIAGNOSIS — I708 Atherosclerosis of other arteries: Secondary | ICD-10-CM | POA: Diagnosis present

## 2019-07-15 DIAGNOSIS — I70221 Atherosclerosis of native arteries of extremities with rest pain, right leg: Secondary | ICD-10-CM | POA: Diagnosis not present

## 2019-07-15 DIAGNOSIS — Z7982 Long term (current) use of aspirin: Secondary | ICD-10-CM

## 2019-07-15 DIAGNOSIS — Z95828 Presence of other vascular implants and grafts: Secondary | ICD-10-CM | POA: Diagnosis not present

## 2019-07-15 DIAGNOSIS — Z87891 Personal history of nicotine dependence: Secondary | ICD-10-CM

## 2019-07-15 DIAGNOSIS — I252 Old myocardial infarction: Secondary | ICD-10-CM | POA: Diagnosis not present

## 2019-07-15 DIAGNOSIS — Z8249 Family history of ischemic heart disease and other diseases of the circulatory system: Secondary | ICD-10-CM

## 2019-07-15 DIAGNOSIS — K219 Gastro-esophageal reflux disease without esophagitis: Secondary | ICD-10-CM | POA: Diagnosis present

## 2019-07-15 DIAGNOSIS — N189 Chronic kidney disease, unspecified: Secondary | ICD-10-CM | POA: Diagnosis present

## 2019-07-15 DIAGNOSIS — K222 Esophageal obstruction: Secondary | ICD-10-CM | POA: Diagnosis present

## 2019-07-15 DIAGNOSIS — K449 Diaphragmatic hernia without obstruction or gangrene: Secondary | ICD-10-CM | POA: Diagnosis present

## 2019-07-15 DIAGNOSIS — G8929 Other chronic pain: Secondary | ICD-10-CM | POA: Diagnosis present

## 2019-07-15 DIAGNOSIS — Z803 Family history of malignant neoplasm of breast: Secondary | ICD-10-CM

## 2019-07-15 DIAGNOSIS — Z885 Allergy status to narcotic agent status: Secondary | ICD-10-CM

## 2019-07-15 DIAGNOSIS — Z8719 Personal history of other diseases of the digestive system: Secondary | ICD-10-CM

## 2019-07-15 DIAGNOSIS — I2510 Atherosclerotic heart disease of native coronary artery without angina pectoris: Secondary | ICD-10-CM | POA: Diagnosis present

## 2019-07-15 DIAGNOSIS — Z882 Allergy status to sulfonamides status: Secondary | ICD-10-CM

## 2019-07-15 DIAGNOSIS — Z951 Presence of aortocoronary bypass graft: Secondary | ICD-10-CM | POA: Diagnosis not present

## 2019-07-15 DIAGNOSIS — I70222 Atherosclerosis of native arteries of extremities with rest pain, left leg: Secondary | ICD-10-CM | POA: Diagnosis not present

## 2019-07-15 DIAGNOSIS — K08409 Partial loss of teeth, unspecified cause, unspecified class: Secondary | ICD-10-CM | POA: Diagnosis present

## 2019-07-15 DIAGNOSIS — I452 Bifascicular block: Secondary | ICD-10-CM | POA: Diagnosis present

## 2019-07-15 DIAGNOSIS — Z9049 Acquired absence of other specified parts of digestive tract: Secondary | ICD-10-CM

## 2019-07-15 DIAGNOSIS — Z823 Family history of stroke: Secondary | ICD-10-CM

## 2019-07-15 DIAGNOSIS — Z9089 Acquired absence of other organs: Secondary | ICD-10-CM

## 2019-07-15 DIAGNOSIS — Z833 Family history of diabetes mellitus: Secondary | ICD-10-CM

## 2019-07-15 DIAGNOSIS — M5126 Other intervertebral disc displacement, lumbar region: Secondary | ICD-10-CM | POA: Diagnosis present

## 2019-07-15 DIAGNOSIS — I701 Atherosclerosis of renal artery: Secondary | ICD-10-CM | POA: Diagnosis present

## 2019-07-15 DIAGNOSIS — M533 Sacrococcygeal disorders, not elsewhere classified: Secondary | ICD-10-CM | POA: Diagnosis present

## 2019-07-15 DIAGNOSIS — Z86010 Personal history of colonic polyps: Secondary | ICD-10-CM

## 2019-07-15 DIAGNOSIS — Z79899 Other long term (current) drug therapy: Secondary | ICD-10-CM

## 2019-07-15 HISTORY — PX: ENDARTERECTOMY FEMORAL: SHX5804

## 2019-07-15 HISTORY — PX: FEMORAL-FEMORAL BYPASS GRAFT: SHX936

## 2019-07-15 LAB — CBC
HCT: 32.8 % — ABNORMAL LOW (ref 39.0–52.0)
Hemoglobin: 10.9 g/dL — ABNORMAL LOW (ref 13.0–17.0)
MCH: 32.4 pg (ref 26.0–34.0)
MCHC: 33.2 g/dL (ref 30.0–36.0)
MCV: 97.6 fL (ref 80.0–100.0)
Platelets: 120 10*3/uL — ABNORMAL LOW (ref 150–400)
RBC: 3.36 MIL/uL — ABNORMAL LOW (ref 4.22–5.81)
RDW: 15.9 % — ABNORMAL HIGH (ref 11.5–15.5)
WBC: 5.6 10*3/uL (ref 4.0–10.5)
nRBC: 0 % (ref 0.0–0.2)

## 2019-07-15 LAB — CREATININE, SERUM
Creatinine, Ser: 1.41 mg/dL — ABNORMAL HIGH (ref 0.61–1.24)
GFR calc Af Amer: 56 mL/min — ABNORMAL LOW (ref >60)
GFR calc non Af Amer: 48 mL/min — ABNORMAL LOW (ref >60)

## 2019-07-15 LAB — TYPE AND SCREEN
ABO/RH(D): O NEG
Antibody Screen: POSITIVE

## 2019-07-15 SURGERY — CREATION, BYPASS, ARTERIAL, FEMORAL TO FEMORAL, USING GRAFT
Anesthesia: General | Site: Groin | Laterality: Right

## 2019-07-15 MED ORDER — ACETAMINOPHEN 325 MG PO TABS: 325.0000 mg | ORAL_TABLET | ORAL | Status: DC | PRN

## 2019-07-15 MED ORDER — HEPARIN SODIUM (PORCINE) 1000 UNIT/ML IJ SOLN
INTRAMUSCULAR | Status: AC
  Filled 2019-07-15: qty 1

## 2019-07-15 MED ORDER — DEXAMETHASONE SODIUM PHOSPHATE 10 MG/ML IJ SOLN
INTRAMUSCULAR | Status: DC | PRN
  Administered 2019-07-15: 10 mg via INTRAVENOUS

## 2019-07-15 MED ORDER — LABETALOL HCL 5 MG/ML IV SOLN: 10.0000 mg | INTRAVENOUS | Status: DC | PRN

## 2019-07-15 MED ORDER — HYDROMORPHONE HCL 1 MG/ML IJ SOLN
0.5000 mg | INTRAMUSCULAR | Status: DC | PRN
  Administered 2019-07-17: 1 mg via INTRAVENOUS
  Filled 2019-07-15: qty 1

## 2019-07-15 MED ORDER — CHLORHEXIDINE GLUCONATE CLOTH 2 % EX PADS: 6.0000 | MEDICATED_PAD | Freq: Once | CUTANEOUS | Status: DC

## 2019-07-15 MED ORDER — MIDAZOLAM HCL 5 MG/5ML IJ SOLN
INTRAMUSCULAR | Status: DC | PRN
  Administered 2019-07-15: 1 mg via INTRAVENOUS

## 2019-07-15 MED ORDER — SUGAMMADEX SODIUM 200 MG/2ML IV SOLN
INTRAVENOUS | Status: DC | PRN
  Administered 2019-07-15: 200 mg via INTRAVENOUS

## 2019-07-15 MED ORDER — LACTATED RINGERS IV SOLN: INTRAVENOUS | Status: DC | PRN

## 2019-07-15 MED ORDER — ACETAMINOPHEN 500 MG PO TABS
1000.0000 mg | ORAL_TABLET | Freq: Once | ORAL | Status: AC
  Administered 2019-07-15: 1000 mg via ORAL
  Filled 2019-07-15: qty 2

## 2019-07-15 MED ORDER — CEFAZOLIN SODIUM-DEXTROSE 2-4 GM/100ML-% IV SOLN
2.0000 g | INTRAVENOUS | Status: AC
  Administered 2019-07-15: 08:00:00 2 g via INTRAVENOUS
  Filled 2019-07-15: qty 100

## 2019-07-15 MED ORDER — PROTAMINE SULFATE 10 MG/ML IV SOLN
INTRAVENOUS | Status: DC | PRN
Start: 2019-07-15 — End: 2019-07-15
  Administered 2019-07-15: 10 mg via INTRAVENOUS
  Administered 2019-07-15 (×2): 20 mg via INTRAVENOUS
  Administered 2019-07-15: 10 mg via INTRAVENOUS

## 2019-07-15 MED ORDER — DUTASTERIDE 0.5 MG PO CAPS
0.5000 mg | ORAL_CAPSULE | Freq: Every day | ORAL | Status: DC
  Administered 2019-07-16 – 2019-07-18 (×3): 0.5 mg via ORAL
  Filled 2019-07-15 (×3): qty 1

## 2019-07-15 MED ORDER — PROPOFOL 10 MG/ML IV BOLUS
INTRAVENOUS | Status: DC | PRN
  Administered 2019-07-15: 100 mg via INTRAVENOUS

## 2019-07-15 MED ORDER — HYDRALAZINE HCL 20 MG/ML IJ SOLN: 5.0000 mg | INTRAMUSCULAR | Status: DC | PRN

## 2019-07-15 MED ORDER — PANTOPRAZOLE SODIUM 40 MG PO TBEC
40.0000 mg | DELAYED_RELEASE_TABLET | Freq: Every day | ORAL | Status: DC
  Administered 2019-07-16 – 2019-07-18 (×3): 40 mg via ORAL
  Filled 2019-07-15 (×4): qty 1

## 2019-07-15 MED ORDER — LIDOCAINE 2% (20 MG/ML) 5 ML SYRINGE
INTRAMUSCULAR | Status: DC | PRN
  Administered 2019-07-15: 50 mg via INTRAVENOUS

## 2019-07-15 MED ORDER — SENNOSIDES-DOCUSATE SODIUM 8.6-50 MG PO TABS: 1.0000 | ORAL_TABLET | Freq: Every evening | ORAL | Status: DC | PRN

## 2019-07-15 MED ORDER — ALUM & MAG HYDROXIDE-SIMETH 200-200-20 MG/5ML PO SUSP: 15.0000 mL | ORAL | Status: DC | PRN

## 2019-07-15 MED ORDER — SODIUM CHLORIDE 0.9 % IV SOLN: 500.0000 mL | Freq: Once | INTRAVENOUS | Status: DC | PRN

## 2019-07-15 MED ORDER — PROTAMINE SULFATE 10 MG/ML IV SOLN
INTRAVENOUS | Status: AC
  Filled 2019-07-15: qty 5

## 2019-07-15 MED ORDER — CEFAZOLIN SODIUM-DEXTROSE 2-4 GM/100ML-% IV SOLN
2.0000 g | Freq: Three times a day (TID) | INTRAVENOUS | Status: AC
  Administered 2019-07-15 – 2019-07-16 (×2): 2 g via INTRAVENOUS
  Filled 2019-07-15 (×2): qty 100

## 2019-07-15 MED ORDER — PHENOL 1.4 % MT LIQD: 1.0000 | OROMUCOSAL | Status: DC | PRN

## 2019-07-15 MED ORDER — SODIUM CHLORIDE 0.9 % IV SOLN
INTRAVENOUS | Status: AC
  Filled 2019-07-15: qty 1.2

## 2019-07-15 MED ORDER — PRAVASTATIN SODIUM 40 MG PO TABS
40.0000 mg | ORAL_TABLET | Freq: Every day | ORAL | Status: DC
  Administered 2019-07-15 – 2019-07-16 (×2): 40 mg via ORAL
  Filled 2019-07-15 (×3): qty 1

## 2019-07-15 MED ORDER — BISACODYL 5 MG PO TBEC: 5.0000 mg | DELAYED_RELEASE_TABLET | Freq: Every day | ORAL | Status: DC | PRN

## 2019-07-15 MED ORDER — PHENYLEPHRINE 40 MCG/ML (10ML) SYRINGE FOR IV PUSH (FOR BLOOD PRESSURE SUPPORT)
PREFILLED_SYRINGE | INTRAVENOUS | Status: DC | PRN
  Administered 2019-07-15: 80 ug via INTRAVENOUS
  Administered 2019-07-15: 120 ug via INTRAVENOUS

## 2019-07-15 MED ORDER — FLEET ENEMA 7-19 GM/118ML RE ENEM: 1.0000 | ENEMA | Freq: Once | RECTAL | Status: DC | PRN

## 2019-07-15 MED ORDER — TORSEMIDE 20 MG PO TABS
40.0000 mg | ORAL_TABLET | Freq: Two times a day (BID) | ORAL | Status: DC
  Administered 2019-07-15 – 2019-07-18 (×5): 40 mg via ORAL
  Filled 2019-07-15 (×6): qty 2

## 2019-07-15 MED ORDER — FENTANYL CITRATE (PF) 100 MCG/2ML IJ SOLN: 25.0000 ug | INTRAMUSCULAR | Status: DC | PRN

## 2019-07-15 MED ORDER — SODIUM CHLORIDE 0.9 % IV SOLN: INTRAVENOUS | Status: DC

## 2019-07-15 MED ORDER — HEPARIN SODIUM (PORCINE) 5000 UNIT/ML IJ SOLN
5000.0000 [IU] | Freq: Three times a day (TID) | INTRAMUSCULAR | Status: DC
  Administered 2019-07-15 – 2019-07-18 (×8): 5000 [IU] via SUBCUTANEOUS
  Filled 2019-07-15 (×9): qty 1

## 2019-07-15 MED ORDER — ASPIRIN EC 81 MG PO TBEC
81.0000 mg | DELAYED_RELEASE_TABLET | Freq: Every day | ORAL | Status: DC
  Administered 2019-07-16 – 2019-07-18 (×3): 81 mg via ORAL
  Filled 2019-07-15 (×3): qty 1

## 2019-07-15 MED ORDER — ONDANSETRON HCL 4 MG/2ML IJ SOLN
4.0000 mg | Freq: Four times a day (QID) | INTRAMUSCULAR | Status: DC | PRN
  Administered 2019-07-17 – 2019-07-18 (×3): 4 mg via INTRAVENOUS
  Filled 2019-07-15 (×3): qty 2

## 2019-07-15 MED ORDER — SUCRALFATE 1 GM/10ML PO SUSP
1.0000 g | Freq: Three times a day (TID) | ORAL | Status: DC
  Administered 2019-07-15 – 2019-07-18 (×12): 1 g via ORAL
  Filled 2019-07-15 (×11): qty 10

## 2019-07-15 MED ORDER — NITROGLYCERIN 0.4 MG/SPRAY TL SOLN
1.0000 | Status: DC | PRN
  Filled 2019-07-15: qty 12

## 2019-07-15 MED ORDER — PROPOFOL 10 MG/ML IV BOLUS
INTRAVENOUS | Status: AC
  Filled 2019-07-15: qty 20

## 2019-07-15 MED ORDER — GUAIFENESIN-DM 100-10 MG/5ML PO SYRP: 15.0000 mL | ORAL_SOLUTION | ORAL | Status: DC | PRN

## 2019-07-15 MED ORDER — FENTANYL CITRATE (PF) 250 MCG/5ML IJ SOLN
INTRAMUSCULAR | Status: AC
  Filled 2019-07-15: qty 5

## 2019-07-15 MED ORDER — METOPROLOL TARTRATE 5 MG/5ML IV SOLN: 2.0000 mg | INTRAVENOUS | Status: DC | PRN

## 2019-07-15 MED ORDER — PROTAMINE SULFATE 10 MG/ML IV SOLN
INTRAVENOUS | Status: AC
  Filled 2019-07-15: qty 25

## 2019-07-15 MED ORDER — 0.9 % SODIUM CHLORIDE (POUR BTL) OPTIME
TOPICAL | Status: DC | PRN
  Administered 2019-07-15: 2000 mL

## 2019-07-15 MED ORDER — CHLORHEXIDINE GLUCONATE CLOTH 2 % EX PADS
6.0000 | MEDICATED_PAD | Freq: Every day | CUTANEOUS | Status: DC
  Administered 2019-07-16 – 2019-07-18 (×3): 6 via TOPICAL

## 2019-07-15 MED ORDER — DOCUSATE SODIUM 100 MG PO CAPS
100.0000 mg | ORAL_CAPSULE | Freq: Every day | ORAL | Status: DC
  Administered 2019-07-16 – 2019-07-18 (×3): 100 mg via ORAL
  Filled 2019-07-15 (×3): qty 1

## 2019-07-15 MED ORDER — ONDANSETRON HCL 4 MG/2ML IJ SOLN
INTRAMUSCULAR | Status: DC | PRN
  Administered 2019-07-15: 4 mg via INTRAVENOUS

## 2019-07-15 MED ORDER — POTASSIUM CHLORIDE CRYS ER 20 MEQ PO TBCR: 20.0000 meq | EXTENDED_RELEASE_TABLET | Freq: Every day | ORAL | Status: DC | PRN

## 2019-07-15 MED ORDER — HEPARIN SODIUM (PORCINE) 1000 UNIT/ML IJ SOLN
INTRAMUSCULAR | Status: DC | PRN
  Administered 2019-07-15 (×2): 6000 [IU] via INTRAVENOUS

## 2019-07-15 MED ORDER — DEXMEDETOMIDINE HCL 200 MCG/2ML IV SOLN
INTRAVENOUS | Status: DC | PRN
  Administered 2019-07-15: 8 ug via INTRAVENOUS

## 2019-07-15 MED ORDER — MAGNESIUM SULFATE 2 GM/50ML IV SOLN: 2.0000 g | Freq: Every day | INTRAVENOUS | Status: DC | PRN

## 2019-07-15 MED ORDER — CALCIUM CARBONATE ANTACID 500 MG PO CHEW: 1.0000 | CHEWABLE_TABLET | Freq: Three times a day (TID) | ORAL | Status: DC | PRN

## 2019-07-15 MED ORDER — DILTIAZEM HCL ER COATED BEADS 120 MG PO CP24
120.0000 mg | ORAL_CAPSULE | Freq: Every day | ORAL | Status: DC
  Administered 2019-07-16 – 2019-07-18 (×3): 120 mg via ORAL
  Filled 2019-07-15 (×3): qty 1

## 2019-07-15 MED ORDER — MIDAZOLAM HCL 2 MG/2ML IJ SOLN
INTRAMUSCULAR | Status: AC
  Filled 2019-07-15: qty 2

## 2019-07-15 MED ORDER — OXYCODONE HCL 5 MG PO TABS
5.0000 mg | ORAL_TABLET | ORAL | Status: DC | PRN
  Administered 2019-07-16: 5 mg via ORAL
  Administered 2019-07-17: 10 mg via ORAL
  Administered 2019-07-17: 5 mg via ORAL
  Filled 2019-07-15: qty 2
  Filled 2019-07-15 (×2): qty 1

## 2019-07-15 MED ORDER — ACETAMINOPHEN 650 MG RE SUPP: 325.0000 mg | RECTAL | Status: DC | PRN

## 2019-07-15 MED ORDER — GABAPENTIN 300 MG PO CAPS
600.0000 mg | ORAL_CAPSULE | Freq: Every day | ORAL | Status: DC | PRN
  Administered 2019-07-18: 600 mg via ORAL
  Filled 2019-07-15: qty 2

## 2019-07-15 MED ORDER — METOLAZONE 5 MG PO TABS: 2.5000 mg | ORAL_TABLET | Freq: Every day | ORAL | Status: DC | PRN

## 2019-07-15 MED ORDER — EZETIMIBE 10 MG PO TABS
10.0000 mg | ORAL_TABLET | Freq: Every day | ORAL | Status: DC
  Administered 2019-07-16 – 2019-07-18 (×3): 10 mg via ORAL
  Filled 2019-07-15 (×3): qty 1

## 2019-07-15 MED ORDER — PHENYLEPHRINE HCL-NACL 10-0.9 MG/250ML-% IV SOLN
INTRAVENOUS | Status: DC | PRN
  Administered 2019-07-15: 40 ug/min via INTRAVENOUS

## 2019-07-15 MED ORDER — ROCURONIUM BROMIDE 10 MG/ML (PF) SYRINGE
PREFILLED_SYRINGE | INTRAVENOUS | Status: DC | PRN
  Administered 2019-07-15: 50 mg via INTRAVENOUS
  Administered 2019-07-15: 30 mg via INTRAVENOUS
  Administered 2019-07-15: 20 mg via INTRAVENOUS

## 2019-07-15 MED ORDER — SODIUM CHLORIDE 0.9 % IV SOLN
INTRAVENOUS | Status: DC | PRN
  Administered 2019-07-15: 500 mL

## 2019-07-15 MED ORDER — RANOLAZINE ER 500 MG PO TB12
500.0000 mg | ORAL_TABLET | Freq: Two times a day (BID) | ORAL | Status: DC
  Administered 2019-07-15 – 2019-07-18 (×6): 500 mg via ORAL
  Filled 2019-07-15 (×6): qty 1

## 2019-07-15 MED ORDER — FENTANYL CITRATE (PF) 250 MCG/5ML IJ SOLN
INTRAMUSCULAR | Status: DC | PRN
  Administered 2019-07-15 (×5): 50 ug via INTRAVENOUS

## 2019-07-15 SURGICAL SUPPLY — 61 items
ADH SKN CLS APL DERMABOND .7 (GAUZE/BANDAGES/DRESSINGS) ×6
AGENT HMST SPONGE THK3/8 (HEMOSTASIS)
BAG ISL DRAPE 18X18 STRL (DRAPES) ×6
BAG ISOLATION DRAPE 18X18 (DRAPES) ×3 IMPLANT
BNDG GAUZE ELAST 4 BULKY (GAUZE/BANDAGES/DRESSINGS) IMPLANT
CANISTER SUCT 3000ML PPV (MISCELLANEOUS) ×4 IMPLANT
CANNULA VESSEL 3MM 2 BLNT TIP (CANNULA) ×8 IMPLANT
CLIP VESOCCLUDE MED 24/CT (CLIP) ×4 IMPLANT
CLIP VESOCCLUDE SM WIDE 24/CT (CLIP) ×4 IMPLANT
COVER SURGICAL LIGHT HANDLE (MISCELLANEOUS) ×4 IMPLANT
COVER WAND RF STERILE (DRAPES) ×4 IMPLANT
DERMABOND ADVANCED (GAUZE/BANDAGES/DRESSINGS) ×2
DERMABOND ADVANCED .7 DNX12 (GAUZE/BANDAGES/DRESSINGS) ×3 IMPLANT
DRAIN HEMOVAC 1/8 X 5 (WOUND CARE) IMPLANT
DRAPE ISOLATION BAG 18X18 (DRAPES) ×8
ELECT REM PT RETURN 9FT ADLT (ELECTROSURGICAL) ×4
ELECTRODE REM PT RTRN 9FT ADLT (ELECTROSURGICAL) ×3 IMPLANT
EVACUATOR SILICONE 100CC (DRAIN) IMPLANT
GAUZE SPONGE 4X4 12PLY STRL (GAUZE/BANDAGES/DRESSINGS) ×4 IMPLANT
GAUZE SPONGE 4X4 16PLY XRAY LF (GAUZE/BANDAGES/DRESSINGS) ×1 IMPLANT
GLOVE BIO SURGEON STRL SZ7.5 (GLOVE) ×6 IMPLANT
GLOVE BIOGEL PI IND STRL 6.5 (GLOVE) IMPLANT
GLOVE BIOGEL PI IND STRL 7.5 (GLOVE) IMPLANT
GLOVE BIOGEL PI INDICATOR 6.5 (GLOVE) ×5
GLOVE BIOGEL PI INDICATOR 7.5 (GLOVE) ×1
GLOVE ECLIPSE 7.0 STRL STRAW (GLOVE) ×1 IMPLANT
GLOVE ECLIPSE 7.5 STRL STRAW (GLOVE) ×1 IMPLANT
GOWN STRL REUS W/ TWL LRG LVL3 (GOWN DISPOSABLE) ×9 IMPLANT
GOWN STRL REUS W/TWL LRG LVL3 (GOWN DISPOSABLE) ×12
GOWN STRL REUS W/TWL XL LVL3 (GOWN DISPOSABLE) ×1 IMPLANT
GRAFT HEMASHIELD 8MM (Vascular Products) ×4 IMPLANT
GRAFT VASC STRG 30X8KNIT (Vascular Products) IMPLANT
HEMOSTAT SPONGE AVITENE ULTRA (HEMOSTASIS) IMPLANT
KIT BASIN OR (CUSTOM PROCEDURE TRAY) ×4 IMPLANT
KIT TURNOVER KIT B (KITS) ×4 IMPLANT
LOOP VESSEL MAXI BLUE (MISCELLANEOUS) ×1 IMPLANT
LOOP VESSEL MINI RED (MISCELLANEOUS) ×4 IMPLANT
NS IRRIG 1000ML POUR BTL (IV SOLUTION) ×8 IMPLANT
PACK GENERAL/GYN (CUSTOM PROCEDURE TRAY) ×3 IMPLANT
PACK PERIPHERAL VASCULAR (CUSTOM PROCEDURE TRAY) ×4 IMPLANT
PACK UNIVERSAL I (CUSTOM PROCEDURE TRAY) ×3 IMPLANT
PAD ARMBOARD 7.5X6 YLW CONV (MISCELLANEOUS) ×8 IMPLANT
SPONGE INTESTINAL PEANUT (DISPOSABLE) ×1 IMPLANT
STAPLER VISISTAT 35W (STAPLE) IMPLANT
SUT ETHILON 3 0 PS 1 (SUTURE) IMPLANT
SUT PROLENE 5 0 C 1 24 (SUTURE) ×12 IMPLANT
SUT PROLENE 6 0 CC (SUTURE) ×10 IMPLANT
SUT PROLENE 7 0 BV1 MDA (SUTURE) ×1 IMPLANT
SUT SILK 3 0 (SUTURE) ×4
SUT SILK 3-0 18XBRD TIE 12 (SUTURE) IMPLANT
SUT VIC AB 2-0 CTX 36 (SUTURE) IMPLANT
SUT VIC AB 2-0 SH 27 (SUTURE) ×8
SUT VIC AB 2-0 SH 27XBRD (SUTURE) ×6 IMPLANT
SUT VIC AB 3-0 SH 27 (SUTURE) ×20
SUT VIC AB 3-0 SH 27X BRD (SUTURE) ×6 IMPLANT
SUT VICRYL 4-0 PS2 18IN ABS (SUTURE) ×8 IMPLANT
TAPE UMBILICAL COTTON 1/8X30 (MISCELLANEOUS) IMPLANT
TOWEL GREEN STERILE (TOWEL DISPOSABLE) ×4 IMPLANT
TRAY FOLEY MTR SLVR 16FR STAT (SET/KITS/TRAYS/PACK) ×4 IMPLANT
UNDERPAD 30X36 HEAVY ABSORB (UNDERPADS AND DIAPERS) ×4 IMPLANT
WATER STERILE IRR 1000ML POUR (IV SOLUTION) ×4 IMPLANT

## 2019-07-15 NOTE — Anesthesia Procedure Notes (Signed)
Arterial Line Insertion Start/End5/12/2019 7:10 AM, 07/15/2019 7:15 AM Performed by: CRNA  Preanesthetic checklist: patient identified, IV checked, site marked, risks and benefits discussed, surgical consent, monitors and equipment checked, pre-op evaluation, timeout performed and anesthesia consent Lidocaine 1% used for infiltration Right, radial was placed Catheter size: 20 G Hand hygiene performed  and maximum sterile barriers used   Attempts: 2 Procedure performed without using ultrasound guided technique. Following insertion, dressing applied and Biopatch. Post procedure assessment: normal  Patient tolerated the procedure well with no immediate complications.

## 2019-07-15 NOTE — Anesthesia Postprocedure Evaluation (Signed)
Anesthesia Post Note  Patient: [name]  Procedure(s) Performed: LEFT TO RIGHT FEMORAL-FEMORAL ARTERY (Bilateral Groin) RIGHT FEMORAL ENDARTARECTOMY WITH PROFUNDOPLASTY (Right Groin)     Patient location during evaluation: PACU Anesthesia Type: General Level of consciousness: awake and alert Pain management: pain level controlled Vital Signs Assessment: post-procedure vital signs reviewed and stable Respiratory status: spontaneous breathing, nonlabored ventilation, respiratory function stable and patient connected to nasal cannula oxygen Cardiovascular status: blood pressure returned to baseline and stable Postop Assessment: no apparent nausea or vomiting Anesthetic complications: no    Last Vitals:  Vitals:   07/15/19 1225 07/15/19 1241  BP: (!) 121/52 118/70  Pulse: 83 83  Resp: 14   Temp: 36.6 C 36.7 C  SpO2: 99% 99%    Last Pain:  Vitals:   07/15/19 1241  TempSrc: Oral  PainSc: 0-No pain                 Kallon Caylor L Asiah Befort

## 2019-07-15 NOTE — Progress Notes (Signed)
Pt. received from PACU, VSS, pt oriented to unit, Pulses Dopplerable , incision clean dry intact, will continue to monitor   Phoebe Sharps, RN

## 2019-07-15 NOTE — Anesthesia Procedure Notes (Signed)
Procedure Name: Intubation Date/Time: 07/15/2019 7:45 AM Performed by: Mariea Clonts, CRNA Pre-anesthesia Checklist: Patient identified, Emergency Drugs available, Suction available and Patient being monitored Patient Re-evaluated:Patient Re-evaluated prior to induction Oxygen Delivery Method: Circle System Utilized Preoxygenation: Pre-oxygenation with 100% oxygen Induction Type: IV induction Ventilation: Mask ventilation without difficulty and Oral airway inserted - appropriate to patient size Laryngoscope Size: Sabra Heck and 2 Grade View: Grade I Tube type: Oral Tube size: 7.5 mm Number of attempts: 1 Airway Equipment and Method: Stylet and Oral airway Placement Confirmation: ETT inserted through vocal cords under direct vision,  positive ETCO2 and breath sounds checked- equal and bilateral Tube secured with: Tape Dental Injury: Teeth and Oropharynx as per pre-operative assessment

## 2019-07-15 NOTE — Transfer of Care (Signed)
Immediate Anesthesia Transfer of Care Note  Patient: [name]  Procedure(s) Performed: LEFT TO RIGHT FEMORAL-FEMORAL ARTERY (Bilateral Groin) RIGHT FEMORAL ENDARTARECTOMY WITH PROFUNDOPLASTY (Right Groin)  Patient Location: PACU  Anesthesia Type:General  Level of Consciousness: awake, alert  and oriented  Airway & Oxygen Therapy: Patient Spontanous Breathing and Patient connected to nasal cannula oxygen  Post-op Assessment: Report given to RN, Post -op Vital signs reviewed and stable and Patient moving all extremities X 4  Post vital signs: Reviewed and stable  Last Vitals:  Vitals Value Taken Time  BP 137/61 07/15/19 1122  Temp    Pulse 39 07/15/19 1124  Resp 19 07/15/19 1124  SpO2 100 % 07/15/19 1124  Vitals shown include unvalidated device data.  Last Pain:  Vitals:   07/15/19 0617  TempSrc:   PainSc: 5       Patients Stated Pain Goal: 3 (41/74/08 1448)  Complications: No apparent anesthesia complications

## 2019-07-15 NOTE — Interval H&P Note (Signed)
History and Physical Interval Note:  07/15/2019 7:25 AM  [place]  has presented today for surgery, with the diagnosis of PERIPHERAL ARTERY DISEASE.  The various methods of treatment have been discussed with the patient and family. After consideration of risks, benefits and other options for treatment, the patient has consented to  Procedure(s): RIGHT GROIN EXPLORATION (Right) BYPASS GRAFT FEMORAL-FEMORAL ARTERY (Bilateral) ENDARTERECTOMY FEMORAL (Right) as a surgical intervention.  The patient's history has been reviewed, patient examined, no change in status, stable for surgery.  I have reviewed the patient's chart and labs.  Questions were answered to the patient's satisfaction.     Ruta Hinds

## 2019-07-15 NOTE — Progress Notes (Signed)
    Patient with good pain control B groin incision soft without hematoma, palpable pulse in fem-fem bypass graft Doppler signals right LE DT/peroneal, left Dp/peroneal.  S/P left to right fem-fem bypass with right common femoral endarterectomy with profundoplasty  Stable post op disposition with patent arterial flow to B LE.  Roxy Horseman PA-C

## 2019-07-15 NOTE — Op Note (Signed)
Procedure: Left to right femoral-femoral bypass, right common femoral endarterectomy with profundoplasty  Preoperative diagnosis: Rest pain right foot  Most operative diagnosis: Same  Anesthesia: General  Assistant: Gerri Lins, PA-C  Operative findings: 1.  8 mm dacron graft left common femoral to right common femoral and profunda  2.  Severe calcific atherosclerosis  Operative details: After obtaining form consent, the patient was taken the operating.  The patient was placed in supine position operating table.  After induction general anesthesia and endotracheal intubation patient was prepped and draped in usual sterile fashion from the umbilicus down to the toes bilaterally.  Next a longitudinal incision was made in the right groin carried down to the subcutaneous tissues down the level of the right common femoral artery.  There was no pulse within it.  It was heavily calcified.  It was dissected free circumferentially all the way underneath the inguinal ligament there is also no pulse at this level.  Several side branches were encircled with Vesseloops.  Dissection was carried down the femoral bifurcation.  The superficial femoral artery was calcified and thickened.  Preoperative arteriogram showed a flush occlusion of this.  There was a fairly early bifurcation of the profunda 2 large branches were dissected free circumferentially and Vesseloops placed around these at their first bifurcation point.  The artery was fairly soft in character at this location.  Attention was then turned to the left groin.  Longitudinal incision was made in the left groin carried down through subtenons tissues down the level left common femoral artery.  This did have a pulse within it.  It was also heavily calcified.  Dissection was carried out underneath the inguinal ligament and the artery was dissected free circumferentially and a vessel loop placed around it.  Several side branches were also controlled with  Vesseloops.  The superficial femoral profunda femoris arteries were also dissected free circumferentially and Vesseloops placed around these.  Next a subcutaneous tunnel was created connecting the left groin to the right groin incision.  8 mm Dacron graft was brought through this.  The patient was given 6000 units of intravenous heparin.  He was given an additional 6000 units of heparin during the course of the case.  Next the right common femoral artery was controlled proximally with a vessel loop as well as distally.  Longitudinal opening was made in the artery.  It was fairly soft on its anterior surface but the medial aspect of the artery was fairly calcified.  The 8 mm dacryon graft was beveled and sewn endograft to side of artery using a running 5-0 Prolene suture.  Just prior to completion anastomosis it was for blood backbled and thoroughly flushed.  Hospice was secured clamps were released and there was good pulsatile flow in the femorofemoral bypass immediately.  Attention was then turned to the right groin.  Longitude opening was made in the right common femoral artery and the right common femoral artery had been essentially obliterated with calcific plaque.  An eversion endarterectomy was performed of the distal external iliac artery and there was a trickle of flow coming down from this afterwards.  This was controlled with a vessel loop.  Endarterectomy was continued down about 2 cm into a very large lateral profunda branch.  A good distal endpoint was obtained and there was good backbleeding from these 2 branches.  There were also 2 other profunda branches from an early bifurcation and these also had good backbleeding.  Eversion endarterectomy was also performed of the  superficial femoral artery.  There was minimal backbleeding from this.  The endarterectomy was extended about 2 cm into the profunda.  The dacryon graft was then beveled and sewn on as a large hooded patch on to the right common femoral  artery extending down into the profunda.  This was done with a running 6-0 and 5-0 Prolene suture.  Despite completion anastomosis it was for blood backbled and thoroughly flushed.  Estimates was secured clamps released there was good Doppler flow in all 4 profunda branches as well as the anterior tibial artery at the level of the ankle.  This augmented about 50 to 60% with clamping and unclamping the graft.  Hemostasis was obtained with 60 mg of protamine as well as a few repair stitches in the right groin.  After hemostasis was obtained both groins were closed in multiple layers with running 2-0 and 3-0 Vicryl suture and 4-0 Vicryl subcuticular stitch in the skin.  Dermabond was applied to both skin incisions.  Patient tolerated procedure well and there were no complications.  The instrument sponge and needle count was correct in the case.  The patient was taken the recovery room in stable condition.  Ruta Hinds, MD [facility] [place] Office: [phone]

## 2019-07-16 ENCOUNTER — Encounter: Payer: Self-pay | Admitting: *Deleted

## 2019-07-16 LAB — BASIC METABOLIC PANEL
Anion gap: 9 (ref 5–15)
BUN: 24 mg/dL — ABNORMAL HIGH (ref 8–23)
CO2: 25 mmol/L (ref 22–32)
Calcium: 8.1 mg/dL — ABNORMAL LOW (ref 8.9–10.3)
Chloride: 105 mmol/L (ref 98–111)
Creatinine, Ser: 1.42 mg/dL — ABNORMAL HIGH (ref 0.61–1.24)
GFR calc Af Amer: 56 mL/min — ABNORMAL LOW (ref >60)
GFR calc non Af Amer: 48 mL/min — ABNORMAL LOW (ref >60)
Glucose, Bld: 161 mg/dL — ABNORMAL HIGH (ref 70–99)
Potassium: 4.4 mmol/L (ref 3.5–5.1)
Sodium: 139 mmol/L (ref 135–145)

## 2019-07-16 LAB — CBC
HCT: 28.1 % — ABNORMAL LOW (ref 39.0–52.0)
Hemoglobin: 9.6 g/dL — ABNORMAL LOW (ref 13.0–17.0)
MCH: 33.1 pg (ref 26.0–34.0)
MCHC: 34.2 g/dL (ref 30.0–36.0)
MCV: 96.9 fL (ref 80.0–100.0)
Platelets: 121 10*3/uL — ABNORMAL LOW (ref 150–400)
RBC: 2.9 MIL/uL — ABNORMAL LOW (ref 4.22–5.81)
RDW: 15.8 % — ABNORMAL HIGH (ref 11.5–15.5)
WBC: 8 10*3/uL (ref 4.0–10.5)
nRBC: 0 % (ref 0.0–0.2)

## 2019-07-16 NOTE — Progress Notes (Addendum)
  Progress Note    07/16/2019 7:39 AM 1 Day Post-Op  Subjective:  Denies rest pain.  Complains of groin discomfort and fullness   Vitals:   07/16/19 0500 07/16/19 0737  BP: (!) 95/53 (!) 119/50  Pulse: 84 81  Resp: (!) 31 18  Temp:  98.2 F (36.8 C)  SpO2: 96% 100%   Physical Exam: Lungs:  Non labored Incisions:  Groin incisions c/d/i without hematoma or other fluid collection Extremities:  AT and PT signals by doppler BLE; R foot and toe wounds without obvious infection Neurologic: A&O  CBC    Component Value Date/Time   WBC 8.0 07/15/2019 2350   RBC 2.90 (L) 07/15/2019 2350   HGB 9.6 (L) 07/15/2019 2350   HGB 11.4 (L) 05/30/2017 1411   HCT 28.1 (L) 07/15/2019 2350   HCT 33.5 (L) 05/30/2017 1411   PLT 121 (L) 07/15/2019 2350   PLT 163 05/30/2017 1411   MCV 96.9 07/15/2019 2350   MCV 91 05/30/2017 1411   MCH 33.1 07/15/2019 2350   MCHC 34.2 07/15/2019 2350   RDW 15.8 (H) 07/15/2019 2350   RDW 15.1 05/30/2017 1411   LYMPHSABS 0.4 (L) 07/23/2018 2045   MONOABS 0.7 07/23/2018 2045   EOSABS 0.0 07/23/2018 2045   BASOSABS 0.0 07/23/2018 2045    BMET    Component Value Date/Time   NA 139 07/15/2019 2350   NA 134 12/07/2018 1200   K 4.4 07/15/2019 2350   CL 105 07/15/2019 2350   CO2 25 07/15/2019 2350   GLUCOSE 161 (H) 07/15/2019 2350   BUN 24 (H) 07/15/2019 2350   BUN 24 12/07/2018 1200   CREATININE 1.42 (H) 07/15/2019 2350   CREATININE 1.12 10/14/2015 1117   CALCIUM 8.1 (L) 07/15/2019 2350   GFRNONAA 48 (L) 07/15/2019 2350   GFRAA 56 (L) 07/15/2019 2350    INR    Component Value Date/Time   INR 1.1 07/11/2019 1112     Intake/Output Summary (Last 24 hours) at 07/16/2019 0739 Last data filed at 07/16/2019 0700 Gross per 24 hour  Intake 2377.48 ml  Output 1709 ml  Net 668.48 ml     Assessment/Plan:  74 y.o. male is s/p L to R femoral to femoral bypass with R CFA endarterectomy 1 Day Post-Op   BLE well perfused with AT and PT by doppler;  palpable fem-fem bypass flow OOB today Home tomorrow if pain controlled and mobility improved   Dagoberto Ligas, PA-C [facility] [phone] 07/16/2019 7:39 AM  Agree with above. Groin incisions healing 2+ graft pulse doppler pedals Most likely d/c am  Ruta Hinds, MD [facility] [place] Office: [phone]

## 2019-07-16 NOTE — Progress Notes (Signed)
Occupational Therapy Evaluation Patient Details Name: Kyreese Chio MRN: [id number] DOB: 1945/07/03 Today's Date: 07/16/2019    History of Present Illness Mahonri Benji Poynter is a 74 y.o. male with a PMH of afib, bulging discs, chronic back pain, DDD, heart failure, CABG. Pt now s/p left to right fem-fem bypass with right common femoral endarterectomy with profundoplasty on 07/15/19.    Clinical Impression   PTA pt lived with his wife, independent in all ADLs except for bathing task. Pt ambulates independently with a RW and reports 0 falls in the last 6 months. Pt does not drive and wife completes all IADLs. Pt currently independent to min guard for self-care and mobility tasks. Pt able to ambulate around room with RW and min guard. 0 instances of LOB, however pt unsteady on feet. Pt completed toileting task with min guard and LB dressing with supervision. Pt tolerated standing ~4 min at the sink to complete grooming/hygiene tasks. Educated pt on safety strategies and activity pacing techniques to increase balance and safety with fair understanding. Pt demonstrates decreased strength, endurance, balance, standing tolerance, and activity tolerance impacting ability to complete self-care and functional transfer tasks. Recommend skilled OT services to address above deficits in order to promote function and prevent further decline.   BP semi-reclined: 101/55mmHg. BP sitting: 103/22mmHg. BP standing: 116/32mmHg. No reports of dizziness throughout. SpO2 maintained in 90s on room air. HR max 146.     Follow Up Recommendations  Supervision - Intermittent;No OT follow up    Equipment Recommendations  None recommended by OT    Recommendations for Other Services       Precautions / Restrictions Precautions Precautions: Fall;Other (comment) Precaution Comments: Radial art line. Monitor BP and HR.  Required Braces or Orthoses: (Postop shoe for RLE and house shoe for LLE per pt  report) Restrictions Weight Bearing Restrictions: No      Mobility Bed Mobility Overal bed mobility: Needs Assistance Bed Mobility: Supine to Sit     Supine to sit: Supervision     General bed mobility comments: HOB elevated, use of bedrails, increased time.   Transfers Overall transfer level: Needs assistance Equipment used: Rolling walker (2 wheeled) Transfers: Sit to/from [facility] Sit to Stand: Min guard Stand pivot transfers: Min guard       General transfer comment: To ensure balance and safety    Balance Overall balance assessment: Needs assistance Sitting-balance support: Feet supported Sitting balance-Leahy Scale: Good       Standing balance-Leahy Scale: Fair                             ADL either performed or assessed with clinical judgement   ADL Overall ADL's : Needs assistance/impaired Eating/Feeding: Independent;Sitting   Grooming: Wash/dry hands;Wash/dry face;Standing;Min guard Grooming Details (indicate cue type and reason): While standing at the sink. Min guard to ensure balance and safety Upper Body Bathing: Set up;Supervision/ safety;Sitting   Lower Body Bathing: Minimal assistance;Sit to/from stand;Sitting/lateral leans   Upper Body Dressing : Set up;Supervision/safety;Sitting   Lower Body Dressing: Set up;Supervision/safety;Sitting/lateral leans Lower Body Dressing Details (indicate cue type and reason): Pt able to don shoes utilizing figure four position while seated EOB.  Toilet Transfer: Min guard;Ambulation;Regular Toilet;Grab bars   Toileting- Clothing Manipulation and Hygiene: Min guard;Sit to/from stand       Functional mobility during ADLs: Min guard;Rolling walker General ADL Comments: Pt able to ambulate around room with RW and min guard.  0 instances of LOB. Pt tolerated standing ~4 min at the sink to complete grooming/hygiene tasks.      Vision Baseline Vision/History: Wears glasses Wears  Glasses: Reading only       Perception     Praxis      Pertinent Vitals/Pain Pain Assessment: 0-10 Pain Score: 4  Pain Location: Abdomen and right foot Pain Descriptors / Indicators: Other (Comment)(soreness) Pain Intervention(s): Limited activity within patient's tolerance;Monitored during session;Repositioned     Hand Dominance Right   Extremity/Trunk Assessment Upper Extremity Assessment Upper Extremity Assessment: Generalized weakness   Lower Extremity Assessment Lower Extremity Assessment: Defer to PT evaluation       Communication Communication Communication: No difficulties   Cognition Arousal/Alertness: Awake/alert Behavior During Therapy: WFL for tasks assessed/performed Overall Cognitive Status: Within Functional Limits for tasks assessed                                 General Comments: Pt pleasant and willing to participate in therapy tasks. Pt able to follow multi-step instructions without difficulty.    General Comments  BP semi-reclined: 101/26mmHg. BP sitting: 103/48mmHg. BP standing: 116/46mmHg. No reports of dizziness throughout. SpO2 maintained in 90s on room air. HR max 146.     Exercises     Shoulder Instructions      Home Living Family/patient expects to be discharged to:: Private residence Living Arrangements: Spouse/significant other Available Help at Discharge: Family;Available 24 hours/day Type of Home: House Home Access: Stairs to enter [employer] of Steps: 2   Home Layout: One level     Bathroom Shower/Tub: Curtain;Walk-in shower   Bathroom Toilet: Handicapped height(toilet riser on toilet)     Home Equipment: Walker - 2 wheels;Crutches;Cane - single point;Wheelchair - manual;Grab bars - tub/shower;Shower seat;Toilet riser   Additional Comments: Pt has been sponge bathing since January 2021.      Prior Functioning/Environment Level of Independence: Needs assistance  Gait / Transfers Assistance  Needed: Pt ambulates independently with a RW and reports 0 falls in the last 6 months.  ADL's / Homemaking Assistance Needed: Pt independent in all ADLs except sponge bathing which his wife assists him with. Wife completes all IADLs. Pt does not drive.             OT Problem List: Decreased strength;Decreased activity tolerance;Impaired balance (sitting and/or standing);Cardiopulmonary status limiting activity;Pain      OT Treatment/Interventions: Self-care/ADL training;Therapeutic exercise;Neuromuscular education;Energy conservation;DME and/or AE instruction;Therapeutic activities;Patient/family education;Balance training    OT Goals(Current goals can be found in the care plan section) Acute Rehab OT Goals Patient Stated Goal: to go home Time For Goal Achievement: 07/30/19 Potential to Achieve Goals: Good ADL Goals Pt Will Perform Tub/Shower Transfer: Shower transfer;with supervision;ambulating;shower seat Additional ADL Goal #1: Pt to complete all ADLs with modified independence and 0 instances of LOB. Additional ADL Goal #2: Pt to recall and verbalize 3 energy conservation strategies with 0 verbal cues. Additional ADL Goal #3: Pt to tolerate standing up to 5 min with modified independence, in preparation for ADLs.  OT Frequency: Min 2X/week   Barriers to D/C:            Co-evaluation              AM-PAC OT "6 Clicks" Daily Activity     Outcome Measure Help from another person eating meals?: None Help from another person taking care of personal grooming?: A Little Help from another  person toileting, which includes using toliet, bedpan, or urinal?: A Little Help from another person bathing (including washing, rinsing, drying)?: A Little Help from another person to put on and taking off regular upper body clothing?: A Little Help from another person to put on and taking off regular lower body clothing?: A Little 6 Click Score: 19   End of Session Equipment Utilized  During Treatment: Gait belt;Rolling walker Nurse Communication: Mobility status  Activity Tolerance: Patient tolerated treatment well Patient left: in chair;with call bell/phone within reach;with chair alarm set  OT Visit Diagnosis: Unsteadiness on feet (R26.81);Muscle weakness (generalized) (M62.81);Pain Pain - Right/Left: (anterior) Pain - part of body: (abdomen/incisional pain)                Time: 6047-9987 OT Time Calculation (min): 31 min Charges:  OT General Charges $OT Visit: 1 Visit OT Evaluation $OT Eval Moderate Complexity: 1 Mod OT Treatments $Self Care/Home Management : 8-22 mins  Mauri Brooklyn OTR/L [phone]  Mauri Brooklyn 07/16/2019, 11:26 AM

## 2019-07-16 NOTE — Progress Notes (Signed)
Mobility Specialist - Progress Note   07/16/19 1429  Therapy Vitals  Pulse Rate 96  BP (!) 100/59  Oxygen Therapy  SpO2 100 %  O2 Device Room Air  Patient Activity (if Appropriate) In chair  Mobility  Activity Ambulated in hall  Level of Assistance Standby assist, set-up cues, supervision of patient - no hands on  Assistive Device Front wheel walker  Distance Ambulated (ft) 440 ft  Mobility Response Tolerated well  Mobility performed by Mobility specialist  Transport method Ambulatory  $Mobility charge 1 Mobility    Pre-mobility: 96 HR, 100/59 BP, 100% SpO2 During mobility:108 HR, 98% SpO2 Post-mobility: 96 HR, 122/81 BP, 98% SPO2  Pt c/o slight pain in his groin while walking with a RW, otherwise he tolerated it well. I left him lying in his bed with the bed alarm on with both the call bell and phone at his side. His wife was in the room as well.  [place] Specialist

## 2019-07-16 NOTE — Evaluation (Signed)
Physical Therapy Evaluation Patient Details Name: [name] MRN: [id number] DOB: [birth year] Today's Date: 07/16/2019   History of Present Illness  [name] is a 74 y.o. male with a PMH of afib, bulging discs, chronic back pain, DDD, heart failure, CABG. Pt now s/p left to right fem-fem bypass with right common femoral endarterectomy with profundoplasty on 07/15/19.   Clinical Impression  Patient presents with pain, impaired balance and post surgical deficits s/p above surgery. Pt reports being Mod I with RW PTA and lives with wife. Today, pt tolerated gait training with use of RW for support and min guard for safety needing cues to decrease speed and for RW proximity. HR ranged from 100-146 bpm, A-fib and BP soft but stable. Pt has supportive family (son is a PTA and his gf is a Education officer, environmental). Encouraged walking while in the hospital. Will follow acutely to maximize independence and mobility prior to return home.    Follow Up Recommendations No PT follow up;Supervision - Intermittent    Equipment Recommendations  None recommended by PT    Recommendations for Other Services       Precautions / Restrictions Precautions Precautions: Fall;Other (comment) Precaution Comments: Radial art line. Monitor BP and HR.  Required Braces or Orthoses: Other Brace Other Brace: post op shoe for RLE and house shoe for LLE per pt report Restrictions Weight Bearing Restrictions: No      Mobility  Bed Mobility Overal bed mobility: Needs Assistance Bed Mobility: Supine to Sit     Supine to sit: Supervision     General bed mobility comments: Standing at sink upon PT arrival.  Transfers Overall transfer level: Needs assistance Equipment used: Rolling walker (2 wheeled) Transfers: Sit to/from Stand Sit to Stand: Min guard Stand pivot transfers: Min guard       General transfer comment: To ensure balance and safety; transferred to chair post  ambulation.  Ambulation/Gait Ambulation/Gait assistance: Min guard Gait Distance (Feet): 160 Feet Assistive device: Rolling walker (2 wheeled) Gait Pattern/deviations: Step-through pattern;Decreased stride length;Trunk flexed   Gait velocity interpretation: >2.62 ft/sec, indicative of community ambulatory General Gait Details: Fast, steady gait with RW, cues for RW proximity and to decrease speed to safety; HR in A-fib 100-146 bpm, asymptomatic.  Stairs            Wheelchair Mobility    Modified Rankin (Stroke Patients Only)       Balance Overall balance assessment: Needs assistance Sitting-balance support: Feet supported;No upper extremity supported Sitting balance-Leahy Scale: Good     Standing balance support: During functional activity Standing balance-Leahy Scale: Fair Standing balance comment: Able to stand at sink and perform tasks without LOB.                             Pertinent Vitals/Pain Pain Assessment: 0-10 Pain Score: 4  Pain Location: Abdomen and right foot Pain Descriptors / Indicators: Sore Pain Intervention(s): Repositioned;Monitored during session    Home Living Family/patient expects to be discharged to:: Private residence Living Arrangements: Spouse/significant other Available Help at Discharge: Family;Available 24 hours/day Type of Home: House Home Access: Stairs to enter   [employer] of Steps: 2 Home Layout: One level Home Equipment: Walker - 2 wheels;Crutches;Cane - single point;Wheelchair - manual;Grab bars - tub/shower;Shower seat;Toilet riser Additional Comments: Pt has been sponge bathing since January 2021.    Prior Function Level of Independence: Needs assistance   Gait / Transfers Assistance Needed: Pt ambulates  independently with a RW and reports 0 falls in the last 6 months.   ADL's / Homemaking Assistance Needed: Pt independent in all ADLs except sponge bathing which his wife assists him with. Wife  completes all IADLs. Pt does not drive.   Comments: son is a PTA and his girlfriend is a Education officer, environmental who have been giving pt education and pointers     Hand Dominance   Dominant Hand: Right    Extremity/Trunk Assessment   Upper Extremity Assessment Upper Extremity Assessment: Defer to OT evaluation    Lower Extremity Assessment Lower Extremity Assessment: Overall WFL for tasks assessed    Cervical / Trunk Assessment Cervical / Trunk Assessment: Kyphotic  Communication   Communication: No difficulties  Cognition Arousal/Alertness: Awake/alert Behavior During Therapy: WFL for tasks assessed/performed Overall Cognitive Status: Within Functional Limits for tasks assessed                                 General Comments: Pt pleasant and willing to participate in therapy tasks. Pt able to follow multi-step instructions without difficulty.       General Comments General comments (skin integrity, edema, etc.): BP pre session 101/42 supine (per OT) BP post session 107/51, asymptomatic. Sp02 remained high 90s on RA.    Exercises     Assessment/Plan    PT Assessment Patient needs continued PT services  PT Problem List Decreased strength;Decreased mobility;Cardiopulmonary status limiting activity;Pain;Decreased balance;Decreased knowledge of use of DME       PT Treatment Interventions Therapeutic activities;Gait training;Therapeutic exercise;Patient/family education;Balance training;Stair training;Functional mobility training    PT Goals (Current goals can be found in the Care Plan section)  Acute Rehab PT Goals Patient Stated Goal: to go home PT Goal Formulation: With patient Time For Goal Achievement: 07/30/19 Potential to Achieve Goals: Good    Frequency Min 3X/week   Barriers to discharge Inaccessible home environment      Co-evaluation               AM-PAC PT "6 Clicks" Mobility  Outcome Measure Help needed turning from your back to your side while  in a flat bed without using bedrails?: None Help needed moving from lying on your back to sitting on the side of a flat bed without using bedrails?: A Little Help needed moving to and from a bed to a chair (including a wheelchair)?: A Little Help needed standing up from a chair using your arms (e.g., wheelchair or bedside chair)?: A Little Help needed to walk in hospital room?: A Little Help needed climbing 3-5 steps with a railing? : A Little 6 Click Score: 19    End of Session Equipment Utilized During Treatment: Gait belt Activity Tolerance: Patient tolerated treatment well Patient left: in chair;with call bell/phone within reach;with chair alarm set Nurse Communication: Mobility status PT Visit Diagnosis: Pain Pain - Right/Left: (bil) Pain - part of body: Leg    Time: 1105-1130 PT Time Calculation (min) (ACUTE ONLY): 25 min   Charges:   PT Evaluation $PT Eval Moderate Complexity: 1 Mod PT Treatments $Gait Training: 8-22 mins        Marisa Severin, PT, DPT Acute Rehabilitation Services Pager [phone] Office [phone]      Marguarite Arbour A Sabra Heck 07/16/2019, 12:50 PM

## 2019-07-16 NOTE — Progress Notes (Signed)
0640-Foley cath d/ced per MD order.  Balloon deflated completely prior to removal.  Patient tolerated removal without any c/o of pain or discomfort.

## 2019-07-17 ENCOUNTER — Encounter (HOSPITAL_COMMUNITY): Payer: Medicare PPO

## 2019-07-17 MED ORDER — OXYCODONE HCL 5 MG PO TABS: 5.0000 mg | ORAL_TABLET | Freq: Four times a day (QID) | ORAL | 0 refills | Status: DC | PRN

## 2019-07-17 NOTE — Progress Notes (Signed)
07/17/2019 3:07 PM Pt has been nauseated since late this morning but was still wanting to discharge.  Wife was delayed getting here due to a flat tire, when wife arrived to take him home he was so nauseated and dizzy he could not stand up.  Notified Matt Eveland PA, orders to cancel discharge. [place]

## 2019-07-17 NOTE — Progress Notes (Signed)
Physical Therapy Treatment Patient Details Name: [name] MRN: [id number] DOB: 22-Jun-1945 Today's Date: 07/17/2019    History of Present Illness [name] is a 74 y.o. male with a PMH of afib, bulging discs, chronic back pain, DDD, heart failure, CABG. Pt now s/p left to right fem-fem bypass with right common femoral endarterectomy with profundoplasty on 07/15/19.     PT Comments    Pt in bed upon arrival of PT, eager to ambulate despite some reports of pain and onset of nausea/vomiting with mobility. The pt was able to complete 1 bout of ambulation in room with use of RW and good stability, but session was then limited by onset of vomiting following short mobility. The pt was able to perform multiple sit-stand through session and reposition in bed without assist. The pt will continue to benefit from skilled PT to progress mobility and activity tolerance to improve safety and independence following d/c. \    Follow Up Recommendations  No PT follow up;Supervision - Intermittent     Equipment Recommendations  None recommended by PT    Recommendations for Other Services       Precautions / Restrictions Precautions Precautions: Fall;Other (comment) Precaution Comments: monitor BP and HR, n/v during 5/12 session Required Braces or Orthoses: Other Brace Other Brace: post op shoe for RLE and house shoe for LLE per pt report Restrictions Weight Bearing Restrictions: No    Mobility  Bed Mobility Overal bed mobility: Needs Assistance Bed Mobility: Supine to Sit     Supine to sit: Supervision     General bed mobility comments: pt able to come to sitting without assist, use of bed rail and extra time  Transfers Overall transfer level: Needs assistance Equipment used: Rolling walker (2 wheeled) Transfers: Sit to/from Stand Sit to Stand: Min guard         General transfer comment: minG for safety, benefits from VC for hand  placement  Ambulation/Gait Ambulation/Gait assistance: Min guard Gait Distance (Feet): 30 Feet(15 ft forward, 15 ft backward) Assistive device: Rolling walker (2 wheeled) Gait Pattern/deviations: Step-through pattern;Decreased stride length;Trunk flexed   Gait velocity interpretation: 1.31 - 2.62 ft/sec, indicative of limited community ambulator General Gait Details: pt with slow and steady gait today, limited in distance by vomiting after 30 ft ambulation, max HR of 126 during vomiting, in 90s otherwise   Stairs             Wheelchair Mobility    Modified Rankin (Stroke Patients Only)       Balance Overall balance assessment: Needs assistance Sitting-balance support: Feet supported;No upper extremity supported Sitting balance-Leahy Scale: Good     Standing balance support: During functional activity;Bilateral upper extremity supported Standing balance-Leahy Scale: Fair Standing balance comment: able to stand with single UE support for standing BP, bilateral support for ambulation                            Cognition Arousal/Alertness: Awake/alert Behavior During Therapy: WFL for tasks assessed/performed Overall Cognitive Status: Within Functional Limits for tasks assessed                                 General Comments: Pt pleasant and willing to participate in therapy tasks. Pt able to follow multi-step instructions without difficulty.       Exercises      General Comments General comments (skin  integrity, edema, etc.): BP 120/61 supine in bed, 99/60 sitting EOB, 116/65 standing all asymptomatice. SpO2 in 90s throughout on RA. session limited by bleeding following removal of IV and onset of nausea/vomiting with standing      Pertinent Vitals/Pain Pain Assessment: Faces Faces Pain Scale: Hurts little more Pain Location: Abdomen and right foot Pain Descriptors / Indicators: Sore;Grimacing Pain Intervention(s): Monitored during  session;Repositioned    Home Living                      Prior Function            PT Goals (current goals can now be found in the care plan section) Acute Rehab PT Goals Patient Stated Goal: to go home PT Goal Formulation: With patient Time For Goal Achievement: 07/30/19 Potential to Achieve Goals: Good Progress towards PT goals: Progressing toward goals    Frequency    Min 3X/week      PT Plan Current plan remains appropriate    Co-evaluation              AM-PAC PT "6 Clicks" Mobility   Outcome Measure  Help needed turning from your back to your side while in a flat bed without using bedrails?: None Help needed moving from lying on your back to sitting on the side of a flat bed without using bedrails?: None Help needed moving to and from a bed to a chair (including a wheelchair)?: A Little Help needed standing up from a chair using your arms (e.g., wheelchair or bedside chair)?: A Little Help needed to walk in hospital room?: A Little Help needed climbing 3-5 steps with a railing? : A Little 6 Click Score: 20    End of Session Equipment Utilized During Treatment: Gait belt Activity Tolerance: Patient tolerated treatment well Patient left: with call bell/phone within reach;in bed;with bed alarm set Nurse Communication: Mobility status PT Visit Diagnosis: Pain;Difficulty in walking, not elsewhere classified (R26.2) Pain - Right/Left: (bilateral) Pain - part of body: Leg     Time: 8921-1941 PT Time Calculation (min) (ACUTE ONLY): 31 min  Charges:  $Gait Training: 8-22 mins $Therapeutic Activity: 8-22 mins                     Karma Ganja, PT, DPT   Acute Rehabilitation Department Pager #: [phone]   Otho Bellows 07/17/2019, 12:50 PM

## 2019-07-17 NOTE — TOC Transition Note (Signed)
Transition of Care [facility]) - CM/SW Discharge Note Marvetta Gibbons RN, BSN Transitions of Care Unit 4E- RN Case Manager [phone]   Patient Details  Name: [name] MRN: [id number] Date of Birth: 1945/11/07  [facility]) CM/SW Contact:  Dawayne Patricia, RN Phone Number: 07/17/2019, 11:26 AM   Clinical Narrative:    Pt stable for transition home today, pt has all needed DME at home, and no recs made for post discharge f/u by PT. Pt has PCP and f/u in place. No TOC needs noted for transition home.    Final next level of care: Home/Self Care Barriers to Discharge: No Barriers Identified   Patient Goals and CMS Choice Patient states their goals for this hospitalization and ongoing recovery are:: return home   Choice offered to / list presented to : NA  Discharge Placement                 home      Discharge Plan and Services   Discharge Planning Services: NA Post Acute Care Choice: NA          DME Arranged: N/A DME Agency: NA       HH Arranged: NA HH Agency: NA        Social Determinants of Health (SDOH) Interventions     Readmission Risk Interventions Readmission Risk Prevention Plan 07/17/2019 10/15/2018  Transportation Screening Complete Complete  PCP or Specialist Appt within 3-5 Days Complete Complete  HRI or Home Care Consult Complete Complete  Social Work Consult for [place] Planning/Counseling Complete Complete  Palliative Care Screening Not Applicable Not Applicable  Medication Review Press photographer) Complete Complete  Some recent data might be hidden

## 2019-07-17 NOTE — Discharge Instructions (Signed)
 [facility] [place]  Discharge instructions  Lower Extremity Bypass Surgery  Please refer to the following instruction for your post-procedure care. Your surgeon or physician assistant will discuss any changes with you.  Activity  You are encouraged to walk as much as you can. You can slowly return to normal activities during the month after your surgery. Avoid strenuous activity and heavy lifting until your doctor tells you it's OK. Avoid activities such as vacuuming or swinging a golf club. Do not drive until your doctor give the OK and you are no longer taking prescription pain medications. It is also normal to have difficulty with sleep habits, eating and bowel movement after surgery. These will go away with time.  Bathing/Showering  Shower daily after you go home. Do not soak in a bathtub, hot tub, or swim until the incision heals completely.  Incision Care  Clean your incision with mild soap and water. Shower every day. Pat the area dry with a clean towel. You do not need a bandage unless otherwise instructed. Do not apply any ointments or creams to your incision. If you have open wounds you will be instructed how to care for them or a visiting nurse may be arranged for you. If you have staples or sutures along your incision they will be removed at your post-op appointment. You may have skin glue on your incision. Do not peel it off. It will come off on its own in about one week.  Wash the groin wound with soap and water daily and pat dry. (No tub bath-only shower)  Then put a dry gauze or washcloth in the groin to keep this area dry to help prevent wound infection.  Do this daily and as needed.  Do not use Vaseline or neosporin on your incisions.  Only use soap and water on your incisions and then protect and keep dry.  Diet  Resume your normal diet. There are no special food restrictions following this procedure. A low fat/ low cholesterol diet is  recommended for all patients with vascular disease. In order to heal from your surgery, it is CRITICAL to get adequate nutrition. Your body requires vitamins, minerals, and protein. Vegetables are the best source of vitamins and minerals. Vegetables also provide the perfect balance of protein. Processed food has little nutritional value, so try to avoid this.  Medications  Resume taking all your medications unless your doctor or physician assistant tells you not to. If your incision is causing pain, you may take over-the-counter pain relievers such as acetaminophen (Tylenol). If you were prescribed a stronger pain medication, please aware these medication can cause nausea and constipation. Prevent nausea by taking the medication with a snack or meal. Avoid constipation by drinking plenty of fluids and eating foods with high amount of fiber, such as fruits, vegetables, and grains. Take Colace 100 mg (an over-the-counter stool softener) twice a day as needed for constipation.  Do not take Tylenol if you are taking prescription pain medications.  Follow Up  Our office will schedule a follow up appointment 2-3 weeks following discharge.  Please call us immediately for any of the following conditions  Severe or worsening pain in your legs or feet while at rest or while walking Increase pain, redness, warmth, or drainage (pus) from your incision site(s) Fever of 101 degree or higher The swelling in your leg with the bypass suddenly worsens and becomes more painful than when you were in the hospital If you have   been instructed to feel your graft pulse then you should do so every day. If you can no longer feel this pulse, call the office immediately. Not all patients are given this instruction.  Leg swelling is common after leg bypass surgery.  The swelling should improve over a few months following surgery. To improve the swelling, you may elevate your legs above the level of your heart while you are  sitting or resting. Your surgeon or physician assistant may ask you to apply an ACE wrap or wear compression (TED) stockings to help to reduce swelling.  Reduce your risk of vascular disease  Stop smoking. If you would like help call [facility] at 1-800-QUIT-NOW (1-800-784-8669) or [facility] at 336-586-4000.  Manage your cholesterol Maintain a desired weight Control your diabetes weight Control your diabetes Keep your blood pressure down  If you have any questions, please call the office at [phone]  

## 2019-07-17 NOTE — Progress Notes (Signed)
[facility] [place]  Subjective  - right foot hurts some   Objective (!) 105/58 85 98.6 F (37 C) (Oral) 15 98%  Intake/Output Summary (Last 24 hours) at 07/17/2019 0829 Last data filed at 07/17/2019 0455 Gross per 24 hour  Intake 480 ml  Output 2825 ml  Net -2345 ml   Patent fem  Fem pulse Doppler signals both feet   Assessment/Planning: Patent fem fem some right foot pain today May need additional outflow procedure but will give a few weeks to see if profunda flow will be enough D/c home today  Follow up 2-3 weeks with bilateral ABIs  Ruta Hinds 07/17/2019 8:29 AM --  Laboratory Lab Results: Recent Labs    07/15/19 1249 07/15/19 2350  WBC 5.6 8.0  HGB 10.9* 9.6*  HCT 32.8* 28.1*  PLT 120* 121*   BMET Recent Labs    07/15/19 1249 07/15/19 2350  NA  --  139  K  --  4.4  CL  --  105  CO2  --  25  GLUCOSE  --  161*  BUN  --  24*  CREATININE 1.41* 1.42*  CALCIUM  --  8.1*    COAG Lab Results  Component Value Date   INR 1.1 07/11/2019   INR 1.08 01/24/2011   No results found for: PTT

## 2019-07-18 NOTE — Discharge Summary (Signed)
Discharge Summary     [name] 11/03/1945 74 y.o. male  [id number]  Admission Date: 07/15/2019  Discharge Date: 07/18/2019 Physician: Elam Dutch, MD  Admission Diagnosis: PAD (peripheral artery disease) ([facility]) [I73.9]  HPI:   This is a 74 y.o. male with rest pain in right foot.  He underwent arteriogram by Dr. Oneida Alar on May 10, 2019 for a nonhealing wound of his right foot.  At that time he was noted to have occlusion of his right external iliac artery common femoral artery profunda femoris artery superficial femoral artery with reconstitution of a very diseased right below-knee popliteal artery with one-vessel peroneal runoff. PMH of afib, bulging discs, chronic back pain, DDD, heart failure, CABG  Hospital Course:  The patient was admitted to the hospital and taken to the operating room on 07/15/2019 and underwent: left to right femoral-femoral bypass, right common femoral endarterectomy with profundoplasty   Findings: Severe calcific atherosclerosis  The pt tolerated the procedure well and was transported to the PACU in excellent condition.   By POD 1, his vital signs remained stable and he was afebrile.  He had Doppler signals in the right lower extremity.  Palpable pulse in bypass.  His hemoglobin remained stable.  Both groin incisions without hematoma.  On postoperative day 2, his mobility was assessed further and he remained stable.  Arrangements were made for discharge home, however when the patient was out of bed and preparing to leave, he became very nauseated and dizzy.  Discharge was postponed. VS remained stable. On postoperative day 3, his vital signs are stable, he is tolerating his diet and denies dizziness.  His pain was controlled and he was ready for discharge home in satisfactory condition.  The remainder of the hospital course consisted of increasing mobilization and increasing intake of solids without difficulty.  CBC    Component Value  Date/Time   WBC 8.0 07/15/2019 2350   RBC 2.90 (L) 07/15/2019 2350   HGB 9.6 (L) 07/15/2019 2350   HGB 11.4 (L) 05/30/2017 1411   HCT 28.1 (L) 07/15/2019 2350   HCT 33.5 (L) 05/30/2017 1411   PLT 121 (L) 07/15/2019 2350   PLT 163 05/30/2017 1411   MCV 96.9 07/15/2019 2350   MCV 91 05/30/2017 1411   MCH 33.1 07/15/2019 2350   MCHC 34.2 07/15/2019 2350   RDW 15.8 (H) 07/15/2019 2350   RDW 15.1 05/30/2017 1411   LYMPHSABS 0.4 (L) 07/23/2018 2045   MONOABS 0.7 07/23/2018 2045   EOSABS 0.0 07/23/2018 2045   BASOSABS 0.0 07/23/2018 2045    BMET    Component Value Date/Time   NA 139 07/15/2019 2350   NA 134 12/07/2018 1200   K 4.4 07/15/2019 2350   CL 105 07/15/2019 2350   CO2 25 07/15/2019 2350   GLUCOSE 161 (H) 07/15/2019 2350   BUN 24 (H) 07/15/2019 2350   BUN 24 12/07/2018 1200   CREATININE 1.42 (H) 07/15/2019 2350   CREATININE 1.12 10/14/2015 1117   CALCIUM 8.1 (L) 07/15/2019 2350   GFRNONAA 48 (L) 07/15/2019 2350   GFRAA 56 (L) 07/15/2019 2350     Discharge Instructions    Discharge patient   Complete by: As directed    Discharge disposition: 01-Home or Self Care   Discharge patient date: 07/18/2019      Discharge Diagnosis:  PAD (peripheral artery disease) ([place]) [I73.9]  Secondary Diagnosis: Patient Active Problem List   Diagnosis Date Noted  . PAD (peripheral artery disease) ([place]) 07/15/2019  .  Paronychia of toe 03/25/2019  . Gastritis and gastroduodenitis   . Symptomatic anemia 10/10/2018  . AKI (acute kidney injury) ([place]) 10/10/2018  . Hypokalemia 10/10/2018  . Bilateral lower extremity edema 08/17/2018  . Malnutrition of moderate degree 07/25/2018  . Dysphagia 07/24/2018  . Volume overload 07/23/2018  . Nausea and vomiting 07/23/2018  . Chronic mesenteric ischemia ([place])   . NSTEMI (non-ST elevated myocardial infarction) ([place]) 07/12/2018  . Fever 07/12/2018  . Lobar pneumonia ([place]) 07/12/2018  . Diastolic CHF ([place]) 27/08/2374  . Chronic  anticoagulation 04/19/2017  . Subclavian artery stenosis, left ([place])   . Dyspnea   . S/P CABG (coronary artery bypass graft)   . Renal artery stenosis ([place])   . RBBB   . Pyloric stenosis   . Paroxysmal atrial fibrillation ([facility])   . Hyperlipidemia   . Hiatal hernia   . GERD (gastroesophageal reflux disease)   . Benign esophageal stricture   . Esophageal dilatation   . Dysrhythmia   . Complication of anesthesia   . Colon polyps   . Chronic back pain   . Bronchitis   . Atrial fibrillation ([place])   . Palpitations 06/16/2016  . PAF (paroxysmal atrial fibrillation) ([place])   . Change in bowel habits 02/03/2014  . CN (constipation) 02/03/2014  . Rectal bleeding 02/03/2014  . Hyperlipidemia LDL goal <70 01/16/2014  . Carotid stenosis 12/10/2013  . Aftercare following surgery of the circulatory system 12/10/2013  . Carotid disease, bilateral ([place]) 03/06/2013  . Anemia 01/26/2011  . PVD (peripheral vascular disease) ([place]) 01/26/2011  . 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 Crescendo angina ([place]) 01/25/2011  . Presence of stent of bypass graft 01/25/2011  . Coronary artery disease 01/24/2011  . CONSTIPATION 01/25/2010  . NAUSEA 08/06/2009  . ABDOMINAL PAIN, GENERALIZED 08/06/2009  . ABDOMINAL PAIN-MULTIPLE SITES 08/06/2009  . INGUINAL HERNIA 03/12/2009  . FLATULENCE-GAS-BLOATING 03/12/2009  . Ulcer of esophagus without bleeding 01/30/2008  . HYPERLIPIDEMIA 04/24/2007  . Essential hypertension 04/24/2007  . Hx of CABG '84, '96, last PCI 2012 04/24/2007  . HEMORRHOIDS 04/24/2007  . EROSIVE ESOPHAGITIS 04/24/2007  . GASTROESOPHAGEAL REFLUX DISEASE 04/24/2007  . PEPTIC STRICTURE 04/24/2007  . Myocardial infarction ([place]) 03/07/1989   Past Medical History:  Diagnosis Date  . Anemia   . Angina   . Aortic stenosis   . Atrial fibrillation ([place])   . Blood transfusion   . Bronchitis   . Bulging discs    "8 of them; thoracic, lumbar,  sacral area"  . CHF (congestive heart failure) ([place])   . Chronic back pain   . Chronic kidney disease    "mild kidney disease"  . Colon polyps   . Coronary artery disease 01/24/11   Successful PCI long segmental stensois  vein graft to the CX marginal vessel-graft previously stented prox. 3.0x22mm TAXUS stent mid seg 3.0x87mm TAXUS stent now tandem stens of 3.0x31mm & 3.0x71mm placed with the seg. 50,80 & 99% stenosis reduced to 0%  . DDD (degenerative disc disease)   . Diastolic heart failure ([place])   . Dysrhythmia    "PAC's and PVC's"  . Esophageal dilatation    "have had it done 7 times; last time ~ 2001"  . Esophageal stricture   . GERD (gastroesophageal reflux disease)   . Headache(784.0)   . Heart murmur   . Hiatal hernia   . Hyperlipidemia   . Hypertension   . Myocardial infarction ([place]) 1991  . Myocardial infarction [facility]) 2005   "  had 3 heart attacks this year"  . Paroxysmal atrial fibrillation ([facility])   . Pneumonia 2020  . Pyloric stenosis   . RBBB   . Renal artery stenosis ([facility])    hhistory of right renal artery stenting and re-intervention for "in-stent restenosis.  . S/P CABG (coronary artery bypass graft) [place]  . Shortness of breath    "when I had the heart attacks"  . Subclavian artery stenosis, left ([place])   . Ulcer    "small; Dr. Henrene Pastor found it 02/2010"     Allergies as of 07/18/2019      Reactions   Codeine Nausea And Vomiting   Sulfa Antibiotics Rash   Sulfonamide Derivatives Rash      Medication List    TAKE these medications   ALAWAY OP Place 1 drop into the right eye daily. Uses before putting contact Lens in R eye   aspirin EC 81 MG tablet Take 1 tablet (81 mg total) by mouth daily.   butalbital-aspirin-caffeine 50-325-40 MG capsule Commonly known as: FIORINAL Take 1 capsule by mouth daily as needed for headache.   calcium carbonate 500 MG chewable tablet Commonly known as: TUMS - dosed in mg  elemental calcium Chew 1 tablet (200 mg of elemental calcium total) by mouth 3 (three) times daily as needed for indigestion or heartburn.   diltiazem 120 MG 24 hr capsule Commonly known as: CARDIZEM CD TAKE 1 CAPSULE BY MOUTH EVERY DAY What changed: how much to take   dutasteride 0.5 MG capsule Commonly known as: AVODART Take 0.5 mg by mouth daily.   esomeprazole 40 MG capsule Commonly known as: NexIUM Take 1 capsule (40 mg total) by mouth 2 (two) times daily before a meal.   ezetimibe 10 MG tablet Commonly known as: ZETIA TAKE 1 TABLET (10 MG TOTAL) BY MOUTH DAILY.   Horizant 600 MG Tbcr Generic drug: Gabapentin Enacarbil Take 600 mg by mouth daily as needed (restless leg). (1700)   loratadine 10 MG tablet Commonly known as: CLARITIN Take 10 mg by mouth daily as needed for allergies, rhinitis or itching.   lovastatin 40 MG tablet Commonly known as: MEVACOR TAKE 1 TABLET BY MOUTH AT BEDTIME What changed: how much to take   metolazone 2.5 MG tablet Commonly known as: ZAROXOLYN Take 2.5 mg by mouth daily as needed (fluid retention).   multivitamins ther. w/minerals Tabs tablet Take 1 tablet by mouth every evening.   nitroGLYCERIN 0.4 MG/SPRAY spray Commonly known as: NITROLINGUAL Place 1 spray under the tongue every 5 (five) minutes x 3 doses as needed for chest pain.   oxyCODONE 5 MG immediate release tablet Commonly known as: Oxy IR/ROXICODONE Take 1 tablet (5 mg total) by mouth every 6 (six) hours as needed for moderate pain. Notes to patient: Last dose 07/17/2019 @ 6:30 a.m.   Potassium Chloride ER 20 MEQ Tbcr Take 20 mEq by mouth every evening.   ranolazine 500 MG 12 hr tablet Commonly known as: RANEXA Take 500 mg by mouth 2 (two) times daily.   senna-docusate 8.6-50 MG tablet Commonly known as: Senokot-S Take 1 tablet by mouth daily. What changed: when to take this   sucralfate 1 GM/10ML suspension Commonly known as: CARAFATE Take 10 mLs (1 g total)  by mouth 4 (four) times daily -  with meals and at bedtime. What changed: when to take this   torsemide 20 MG tablet Commonly known as: DEMADEX Take 40 mg by mouth  2 (two) times daily. Take 40 mg by mouth 2 times a day. For swelling or weight gain   vitamin E 200 UNIT capsule Take 200 Units by mouth every evening.       Discharge Instructions: [facility] Discharge instructions Lower Extremity Bypass Surgery  Please refer to the following instruction for your post-procedure care. Your surgeon or physician assistant will discuss any changes with you.  Activity  You are encouraged to walk as much as you can. You can slowly return to normal activities during the month after your surgery. Avoid strenuous activity and heavy lifting until your doctor tells you it's OK. Avoid activities such as vacuuming or swinging a golf club. Do not drive until your doctor give the OK and you are no longer taking prescription pain medications. It is also normal to have difficulty with sleep habits, eating and bowel movement after surgery. These will go away with time.  Bathing/Showering  You may shower after you go home. Do not soak in a bathtub, hot tub, or swim until the incision heals completely.  Incision Care  Clean your incision with mild soap and water. Shower every day. Pat the area dry with a clean towel. You do not need a bandage unless otherwise instructed. Do not apply any ointments or creams to your incision. If you have open wounds you will be instructed how to care for them or a visiting nurse may be arranged for you. If you have staples or sutures along your incision they will be removed at your post-op appointment. You may have skin glue on your incision. Do not peel it off. It will come off on its own in about one week.  Wash the groin wound with soap and water daily and pat dry. (No tub bath-only shower)  Then put a dry gauze or washcloth in the groin to  keep this area dry to help prevent wound infection.  Do this daily and as needed.  Do not use Vaseline or neosporin on your incisions.  Only use soap and water on your incisions and then protect and keep dry.  Diet  Resume your normal diet. There are no special food restrictions following this procedure. A low fat/ low cholesterol diet is recommended for all patients with vascular disease. In order to heal from your surgery, it is CRITICAL to get adequate nutrition. Your body requires vitamins, minerals, and protein. Vegetables are the best source of vitamins and minerals. Vegetables also provide the perfect balance of protein. Processed food has little nutritional value, so try to avoid this.  Medications  Resume taking all your medications unless your doctor or Physician Assistant tells you not to. If your incision is causing pain, you may take over-the-counter pain relievers such as acetaminophen (Tylenol). If you were prescribed a stronger pain medication, please aware these medication can cause nausea and constipation. Prevent nausea by taking the medication with a snack or meal. Avoid constipation by drinking plenty of fluids and eating foods with high amount of fiber, such as fruits, vegetables, and grains. Take Colace 100 mg (an over-the-counter stool softener) twice a day as needed for constipation.  Do not take Tylenol if you are taking prescription pain medications.  Follow Up  Our office will schedule a follow up appointment 2-3 weeks following discharge.  Please call us immediately for any of the following conditions  .Severe or worsening pain in your legs or feet while at rest or while walking .Increase pain, redness,  warmth, or drainage (pus) from your incision site(s) . Fever of 101 degree or higher . The swelling in your leg with the bypass suddenly worsens and becomes more painful than when you were in the hospital . If you have been instructed to feel your graft pulse then you  should do so every day. If you can no longer feel this pulse, call the office immediately. Not all patients are given this instruction. .  Leg swelling is common after leg bypass surgery.  The swelling should improve over a few months following surgery. To improve the swelling, you may elevate your legs above the level of your heart while you are sitting or resting. Your surgeon or physician assistant may ask you to apply an ACE wrap or wear compression (TED) stockings to help to reduce swelling.  Reduce your risk of vascular disease  Stop smoking. If you would like help call [facility] at 1-800-QUIT-NOW 551-852-1201) or [facility] at [phone].  . Manage your cholesterol . Maintain a desired weight . Control your diabetes weight . Control your diabetes . Keep your blood pressure down .  If you have any questions, please call the office at [phone]   Prescriptions given: 1.  Roxicet #10 No Refill   Disposition: home  Patient's condition: is Excellent  Follow up: 1. Dr. Oneida Alar in 2 weeks   Risa Grill, PA-C [facility] [phone] 07/18/2019  9:35 AM  - For VQI Registry use ---   Post-op:  Wound infection: No  Graft infection: No  Transfusion: No    If yes, 0 units given New Arrhythmia: No Ipsilateral amputation: No, [ ]  Minor, [ ]  BKA, [ ]  AKA Discharge patency: [x ] Primary, [ ]  Primary assisted, [ ]  Secondary, [ ]  Occluded Patency judged by: [ ]  Dopper only, [x ] Palpable graft pulse, []  Palpable distal pulse, [ ]  ABI inc. > 0.15, [ ]  Duplex Discharge ABI:  D/C Ambulatory Status: Ambulatory  Complications: MI: No, [ ]  Troponin only, [ ]  EKG or Clinical CHF: No Resp failure:No, [ ]  Pneumonia, [ ]  Ventilator Chg in renal function: No, [ ]  Inc. Cr > 0.5, [ ]  Temp. Dialysis,  [ ]  Permanent dialysis Stroke: No, [ ]  Minor, [ ]  Major Return to OR: No  Reason for return to OR: [ ]  Bleeding, [ ]  Infection, [ ]  Thrombosis, [ ]  Revision   Discharge medications: Statin use:  yes ASA use:  yes Plavix use:  no Beta blocker use: no CCB use:  Yes ACEI use:   no ARB use:  no Coumadin use: no

## 2019-07-18 NOTE — Progress Notes (Addendum)
Progress Note    07/18/2019 7:49 AM 3 Days Post-Op  Subjective:  Tolerating diet. Was nauseated yesterday and did not go home. He described it as motion sickness. Denies nausea or dizziness this morning.  Has not been OOB yet today   Vitals:   07/17/19 2305 07/18/19 0629  BP: 116/66 108/62  Pulse: 85 83  Resp: 19 18  Temp: 97.9 F (36.6 C) 98.4 F (36.9 C)  SpO2: 100% 97%    Physical Exam: Cardiac:  RRR Lungs:  CTA bil Incisions:  Both groin incisions well approximated without hematoma Extremities:  AROM both feet. + Doppler signals right DP,PT and peroneal; left DP and peroneal. Dry, chronic ischemic changes to right toes Abdomen:  Soft, ND  CBC    Component Value Date/Time   WBC 8.0 07/15/2019 2350   RBC 2.90 (L) 07/15/2019 2350   HGB 9.6 (L) 07/15/2019 2350   HGB 11.4 (L) 05/30/2017 1411   HCT 28.1 (L) 07/15/2019 2350   HCT 33.5 (L) 05/30/2017 1411   PLT 121 (L) 07/15/2019 2350   PLT 163 05/30/2017 1411   MCV 96.9 07/15/2019 2350   MCV 91 05/30/2017 1411   MCH 33.1 07/15/2019 2350   MCHC 34.2 07/15/2019 2350   RDW 15.8 (H) 07/15/2019 2350   RDW 15.1 05/30/2017 1411   LYMPHSABS 0.4 (L) 07/23/2018 2045   MONOABS 0.7 07/23/2018 2045   EOSABS 0.0 07/23/2018 2045   BASOSABS 0.0 07/23/2018 2045    BMET    Component Value Date/Time   NA 139 07/15/2019 2350   NA 134 12/07/2018 1200   K 4.4 07/15/2019 2350   CL 105 07/15/2019 2350   CO2 25 07/15/2019 2350   GLUCOSE 161 (H) 07/15/2019 2350   BUN 24 (H) 07/15/2019 2350   BUN 24 12/07/2018 1200   CREATININE 1.42 (H) 07/15/2019 2350   CREATININE 1.12 10/14/2015 1117   CALCIUM 8.1 (L) 07/15/2019 2350   GFRNONAA 48 (L) 07/15/2019 2350   GFRAA 56 (L) 07/15/2019 2350     Intake/Output Summary (Last 24 hours) at 07/18/2019 0749 Last data filed at 07/18/2019 1610 Gross per 24 hour  Intake --  Output 850 ml  Net -850 ml    HOSPITAL MEDICATIONS Scheduled Meds: . aspirin EC  81 mg Oral Daily  .  Chlorhexidine Gluconate Cloth  6 each Topical Daily  . diltiazem  120 mg Oral Daily  . docusate sodium  100 mg Oral Daily  . dutasteride  0.5 mg Oral Daily  . ezetimibe  10 mg Oral Daily  . heparin  5,000 Units Subcutaneous Q8H  . pantoprazole  40 mg Oral Daily  . pravastatin  40 mg Oral q1800  . ranolazine  500 mg Oral BID  . sucralfate  1 g Oral TID WC & HS  . torsemide  40 mg Oral BID   Continuous Infusions: . sodium chloride    . magnesium sulfate bolus IVPB     PRN Meds:.sodium chloride, acetaminophen **OR** acetaminophen, alum & mag hydroxide-simeth, bisacodyl, calcium carbonate, gabapentin, guaiFENesin-dextromethorphan, hydrALAZINE, HYDROmorphone (DILAUDID) injection, labetalol, magnesium sulfate bolus IVPB, metolazone, metoprolol tartrate, nitroGLYCERIN, ondansetron, oxyCODONE, phenol, potassium chloride, senna-docusate, sodium phosphate  Assessment:s/p fem-fem bypass Nausea with dizziness yesterday, dc post-poned.  Resolved today    Plan: -dc home    Risa Grill, PA-C [facility] [phone] 07/18/2019  7:49 AM   Agree with above.  Incisions healing.  Fem fem pulse.  Less nausea today. Less toe pain  D/c home  Juanda Crumble  Yarixa Lightcap, MD [facility] [place] Office: [phone]

## 2019-07-18 NOTE — Progress Notes (Addendum)
Occupational Therapy Treatment Patient Details Name: [name] MRN: [id number] DOB: 04-Jun-1945 Today's Date: 07/18/2019    History of present illness [name] is a 74 y.o. male with a PMH of afib, bulging discs, chronic back pain, DDD, heart failure, CABG. Pt now s/p left to right fem-fem bypass with right common femoral endarterectomy with profundoplasty on 07/15/19.    OT comments  Pt making slow steady progress in therapy with session limited this date due to pain, fatigue, dizziness, and nausea. Pt able to ambulate to/from bedroom sink and around room with RW and min guard. 0 instances of LOB, however pt unsteady on feet requiring cues for hand placement during transfer tasks. Pt tolerated standing 7 min at the sink to complete grooming/hygiene tasks. Simulated walk-in shower transfer in room with pt able to complete with min guard requiring cues for body positioning and hand placement. At end of session pt reported increased dizziness and nausea with BP 112/16mmHg. Pt's HR maintained between 70-90 during activity, however when nausea presented HR dropped into 50s. SpO2 90%. RN present and notified. Once nausea had subsided HR was back into 70s. Educated and provided pt with handouts in regards to fall prevention and energy conservation strategies with fair understanding. Pt required increased time and rest breaks throughout due to pain and fatigue. OT will continue to follow acutely.    Follow Up Recommendations  Home health OT;Supervision/Assistance - 24 hour    Equipment Recommendations  None recommended by OT    Recommendations for Other Services      Precautions / Restrictions Precautions Precautions: Fall;Other (comment) Precaution Comments: monitor BP and HR, n/dry heaving during 5/13 session Required Braces or Orthoses: Other Brace Other Brace: post op shoe for RLE and house shoe for LLE per pt report Restrictions Weight Bearing Restrictions: No        Mobility Bed Mobility Overal bed mobility: Needs Assistance Bed Mobility: Supine to Sit     Supine to sit: Supervision     General bed mobility comments: HOB elevated, use of bedrail, increased time  Transfers Overall transfer level: Needs assistance Equipment used: Rolling walker (2 wheeled) Transfers: Sit to/from Stand Sit to Stand: Min guard         General transfer comment: to ensure balance and safety. Cues for hand placement    Balance Overall balance assessment: Needs assistance Sitting-balance support: Feet supported;No upper extremity supported Sitting balance-Leahy Scale: Good       Standing balance-Leahy Scale: Fair                             ADL either performed or assessed with clinical judgement   ADL Overall ADL's : Needs assistance/impaired     Grooming: Wash/dry hands;Wash/dry face;Oral care;Brushing hair;Min guard;Standing Grooming Details (indicate cue type and reason): While standing at the sink. Min guard to ensure balance and safety. Pt reported min dizziness throughout.              Lower Body Dressing: Set up;Supervision/safety;Sitting/lateral leans Lower Body Dressing Details (indicate cue type and reason): Pt able to don shoes utilizing figure four position while seated EOB.          Tub/ Shower Transfer: Walk-in shower;Min Chief Executive Officer Details (indicate cue type and reason): Simulated in room with pt able to complete with min guard. 0 instances of LOB. Educated pt on body positioning and compensatory techniques.  Functional mobility during ADLs: Min guard;Rolling walker  General ADL Comments: Pt able to ambulate to/from bedroom sink and around room with RW and min guard. Pt tolerated standing 7 min at the sink to complete grooming/hygiene tasks.      Vision       Perception     Praxis      Cognition Arousal/Alertness: Awake/alert Behavior During Therapy: WFL for tasks assessed/performed Overall  Cognitive Status: Within Functional Limits for tasks assessed                                 General Comments: Pt pleasant and willing to participate in therapy tasks. Pt able to follow multi-step instructions without difficulty.         Exercises     Shoulder Instructions       General Comments At end of session pt became dizziness and nauseous with pt dry heaving. BP 112/48mmHg. HR consistently maintained between 70-90 during activity, however when nausea presented HR dropped into 50s. RN notified. Educated and provided pt with handouts in regards to energy conservation and fall prevention.     Pertinent Vitals/ Pain       Pain Assessment: 0-10 Pain Score: 5  Pain Location: Right foot Pain Descriptors / Indicators: Sore;Grimacing Pain Intervention(s): Limited activity within patient's tolerance;Monitored during session;Repositioned  Home Living                                          Prior Functioning/Environment              Frequency           Progress Toward Goals  OT Goals(current goals can now be found in the care plan section)  Progress towards OT goals: Progressing toward goals  ADL Goals Pt Will Perform Tub/Shower Transfer: Shower transfer;with supervision;ambulating;shower seat Additional ADL Goal #1: Pt to complete all ADLs with modified independence and 0 instances of LOB. Additional ADL Goal #2: Pt to recall and verbalize 3 energy conservation strategies with 0 verbal cues. Additional ADL Goal #3: Pt to tolerate standing up to 5 min with modified independence, in preparation for ADLs.  Plan Discharge plan remains appropriate    Co-evaluation                 AM-PAC OT "6 Clicks" Daily Activity     Outcome Measure   Help from another person eating meals?: None Help from another person taking care of personal grooming?: A Little Help from another person toileting, which includes using toliet, bedpan, or  urinal?: A Little Help from another person bathing (including washing, rinsing, drying)?: A Little Help from another person to put on and taking off regular upper body clothing?: A Little Help from another person to put on and taking off regular lower body clothing?: A Little 6 Click Score: 19    End of Session Equipment Utilized During Treatment: Gait belt;Rolling walker  OT Visit Diagnosis: Unsteadiness on feet (R26.81);Muscle weakness (generalized) (M62.81);Pain Pain - Right/Left: Right Pain - part of body: (foot)   Activity Tolerance Patient limited by fatigue;Patient limited by pain;Other (comment)(Limited by nausea and dizziness)   Patient Left in chair;with call bell/phone within reach;with chair alarm set;with nursing/sitter in room   Nurse Communication Mobility status        Time: 9528-4132 OT Time Calculation (min): 39 min  Charges: OT  General Charges $OT Visit: 1 Visit OT Treatments $Self Care/Home Management : 8-22 mins $Therapeutic Activity: 23-37 mins  Mauri Brooklyn OTR/L [phone]   Mauri Brooklyn 07/18/2019, 8:56 AM

## 2019-07-18 NOTE — Progress Notes (Signed)
Physical Therapy Treatment Patient Details Name: [name] MRN: [id number] DOB: 1946/01/05 Today's Date: 07/18/2019    History of Present Illness [name] is a 74 y.o. male with a PMH of afib, bulging discs, chronic back pain, DDD, heart failure, CABG. Pt now s/p left to right fem-fem bypass with right common femoral endarterectomy with profundoplasty on 07/15/19.     PT Comments    Pt was seen for mobility on RW to stand but painful and was feeling he might be sick if he walked again.  O2 sats were stable with mobility, had pulse of 102 with mobility to stand and sats were 96%.  Did not ck BP in standing as pt was in pain and did not tolerate standing well to attempt it.  Talked with pt and then nursing about improved sensation from his procedure, and nurse is going to speak to patient about meds to manage this.  Follow acutely as needed, and will encourage pt to be more mobile with meds.   Follow Up Recommendations  No PT follow up;Supervision - Intermittent     Equipment Recommendations  None recommended by PT    Recommendations for Other Services       Precautions / Restrictions Precautions Precautions: Fall Precaution Comments: ck BP, HR and sats Required Braces or Orthoses: Other Brace Other Brace: post op shoe for RLE and house shoe for LLE per pt report Restrictions Weight Bearing Restrictions: No    Mobility  Bed Mobility               General bed mobility comments: up in chair when PT arrived  Transfers Overall transfer level: Needs assistance Equipment used: Rolling walker (2 wheeled);1 person hand held assist Transfers: Sit to/from Stand Sit to Stand: Min assist         General transfer comment: steadied in standing, pt in more pain due to improved circulation  Ambulation/Gait             General Gait Details: had just walked earlier and was nauseated   Chief Strategy Officer    Modified Rankin  (Stroke Patients Only)       Balance Overall balance assessment: Needs assistance Sitting-balance support: Feet supported Sitting balance-Leahy Scale: Good     Standing balance support: Bilateral upper extremity supported;During functional activity Standing balance-Leahy Scale: Fair                              Cognition Arousal/Alertness: Awake/alert Behavior During Therapy: WFL for tasks assessed/performed Overall Cognitive Status: Within Functional Limits for tasks assessed                                        Exercises General Exercises - Lower Extremity Ankle Circles/Pumps: AROM;10 reps Long Arc Quad: AROM;10 reps Heel Slides: AROM;10 reps Hip ABduction/ADduction: AROM;Strengthening;10 reps Hip Flexion/Marching: AROM;10 reps    General Comments General comments (skin integrity, edema, etc.): Had HR of 102 with standing, did not stand enough to get BP reading, sats 96%      Pertinent Vitals/Pain Pain Assessment: 0-10 Pain Score: 5  Pain Location: Right foot Pain Descriptors / Indicators: Guarding Pain Intervention(s): Limited activity within patient's tolerance;Monitored during session;Repositioned;Premedicated before session    Home Living  Prior Function            PT Goals (current goals can now be found in the care plan section) Acute Rehab PT Goals Patient Stated Goal: to go home Progress towards PT goals: Progressing toward goals    Frequency    Min 3X/week      PT Plan Current plan remains appropriate    Co-evaluation              AM-PAC PT "6 Clicks" Mobility   Outcome Measure  Help needed turning from your back to your side while in a flat bed without using bedrails?: None Help needed moving from lying on your back to sitting on the side of a flat bed without using bedrails?: None Help needed moving to and from a bed to a chair (including a wheelchair)?: None Help needed  standing up from a chair using your arms (e.g., wheelchair or bedside chair)?: A Little Help needed to walk in hospital room?: A Little Help needed climbing 3-5 steps with a railing? : A Little 6 Click Score: 21    End of Session Equipment Utilized During Treatment: Gait belt Activity Tolerance: Patient tolerated treatment well Patient left: with call bell/phone within reach;in bed;with bed alarm set Nurse Communication: Mobility status PT Visit Diagnosis: Pain;Difficulty in walking, not elsewhere classified (R26.2) Pain - Right/Left: Right Pain - part of body: Leg     Time: 1210-1239 PT Time Calculation (min) (ACUTE ONLY): 29 min  Charges:  $Therapeutic Exercise: 8-22 mins $Therapeutic Activity: 8-22 mins                  Ramond Dial 07/18/2019, 4:44 PM  Mee Hives, PT MS Acute Rehab Dept. Number: [place] and [place]

## 2019-07-18 NOTE — Progress Notes (Signed)
07/18/2019 1645 Discharge AVS meds taken today and those due this evening reviewed.  Follow-up appointments and when to call md reviewed.  D/C IV and TELE.  Questions and concerns addressed.   D/C home per orders.   Carney Corners

## 2019-07-19 LAB — TYPE AND SCREEN
ABO/RH(D): O NEG
Antibody Screen: POSITIVE
Unit division: 0
Unit division: 0
Unit division: 0

## 2019-07-19 LAB — BPAM RBC
Blood Product Expiration Date: 202106112359
Blood Product Expiration Date: 202106152359
Blood Product Expiration Date: 202106152359
Unit Type and Rh: 9500
Unit Type and Rh: 9500
Unit Type and Rh: 9500

## 2019-07-22 ENCOUNTER — Other Ambulatory Visit: Payer: Self-pay

## 2019-07-22 ENCOUNTER — Telehealth: Payer: Self-pay

## 2019-07-22 ENCOUNTER — Ambulatory Visit (INDEPENDENT_AMBULATORY_CARE_PROVIDER_SITE_OTHER): Payer: Self-pay | Admitting: Physician Assistant

## 2019-07-22 ENCOUNTER — Inpatient Hospital Stay (HOSPITAL_COMMUNITY)
Admission: AD | Admit: 2019-07-22 | Discharge: 2019-07-26 | DRG: 315 | Disposition: A | Discharge status: Home / Self Care | Payer: Medicare PPO | Attending: Vascular Surgery | Admitting: Vascular Surgery

## 2019-07-22 VITALS — BP 105/58 | HR 83 | Temp 97.3°F | Resp 16 | Ht 65.0 in | Wt 122.0 lb

## 2019-07-22 DIAGNOSIS — I2510 Atherosclerotic heart disease of native coronary artery without angina pectoris: Secondary | ICD-10-CM | POA: Diagnosis present

## 2019-07-22 DIAGNOSIS — I9789 Other postprocedural complications and disorders of the circulatory system, not elsewhere classified: Principal | ICD-10-CM | POA: Diagnosis present

## 2019-07-22 DIAGNOSIS — Z7982 Long term (current) use of aspirin: Secondary | ICD-10-CM | POA: Diagnosis not present

## 2019-07-22 DIAGNOSIS — Z79899 Other long term (current) drug therapy: Secondary | ICD-10-CM

## 2019-07-22 DIAGNOSIS — Y832 Surgical operation with anastomosis, bypass or graft as the cause of abnormal reaction of the patient, or of later complication, without mention of misadventure at the time of the procedure: Secondary | ICD-10-CM | POA: Diagnosis present

## 2019-07-22 DIAGNOSIS — S31109A Unspecified open wound of abdominal wall, unspecified quadrant without penetration into peritoneal cavity, initial encounter: Secondary | ICD-10-CM | POA: Diagnosis not present

## 2019-07-22 DIAGNOSIS — I4891 Unspecified atrial fibrillation: Secondary | ICD-10-CM | POA: Diagnosis present

## 2019-07-22 DIAGNOSIS — D631 Anemia in chronic kidney disease: Secondary | ICD-10-CM | POA: Diagnosis present

## 2019-07-22 DIAGNOSIS — Z20822 Contact with and (suspected) exposure to covid-19: Secondary | ICD-10-CM | POA: Diagnosis not present

## 2019-07-22 DIAGNOSIS — I214 Non-ST elevation (NSTEMI) myocardial infarction: Secondary | ICD-10-CM | POA: Diagnosis not present

## 2019-07-22 DIAGNOSIS — I5030 Unspecified diastolic (congestive) heart failure: Secondary | ICD-10-CM | POA: Diagnosis present

## 2019-07-22 DIAGNOSIS — Z95828 Presence of other vascular implants and grafts: Secondary | ICD-10-CM

## 2019-07-22 DIAGNOSIS — I480 Paroxysmal atrial fibrillation: Secondary | ICD-10-CM | POA: Diagnosis not present

## 2019-07-22 DIAGNOSIS — I739 Peripheral vascular disease, unspecified: Secondary | ICD-10-CM | POA: Diagnosis present

## 2019-07-22 DIAGNOSIS — K219 Gastro-esophageal reflux disease without esophagitis: Secondary | ICD-10-CM | POA: Diagnosis present

## 2019-07-22 DIAGNOSIS — I130 Hypertensive heart and chronic kidney disease with heart failure and stage 1 through stage 4 chronic kidney disease, or unspecified chronic kidney disease: Secondary | ICD-10-CM | POA: Diagnosis not present

## 2019-07-22 DIAGNOSIS — I898 Other specified noninfective disorders of lymphatic vessels and lymph nodes: Secondary | ICD-10-CM | POA: Diagnosis present

## 2019-07-22 DIAGNOSIS — I70221 Atherosclerosis of native arteries of extremities with rest pain, right leg: Secondary | ICD-10-CM | POA: Diagnosis not present

## 2019-07-22 DIAGNOSIS — E876 Hypokalemia: Secondary | ICD-10-CM | POA: Diagnosis not present

## 2019-07-22 DIAGNOSIS — I252 Old myocardial infarction: Secondary | ICD-10-CM

## 2019-07-22 DIAGNOSIS — I70222 Atherosclerosis of native arteries of extremities with rest pain, left leg: Secondary | ICD-10-CM | POA: Diagnosis not present

## 2019-07-22 DIAGNOSIS — Z951 Presence of aortocoronary bypass graft: Secondary | ICD-10-CM | POA: Diagnosis not present

## 2019-07-22 DIAGNOSIS — N1831 Chronic kidney disease, stage 3a: Secondary | ICD-10-CM | POA: Diagnosis present

## 2019-07-22 DIAGNOSIS — Z79891 Long term (current) use of opiate analgesic: Secondary | ICD-10-CM | POA: Diagnosis not present

## 2019-07-22 LAB — BASIC METABOLIC PANEL
Anion gap: 10 (ref 5–15)
BUN: 25 mg/dL — ABNORMAL HIGH (ref 8–23)
CO2: 28 mmol/L (ref 22–32)
Calcium: 9 mg/dL (ref 8.9–10.3)
Chloride: 101 mmol/L (ref 98–111)
Creatinine, Ser: 1.45 mg/dL — ABNORMAL HIGH (ref 0.61–1.24)
GFR calc Af Amer: 54 mL/min — ABNORMAL LOW (ref >60)
GFR calc non Af Amer: 47 mL/min — ABNORMAL LOW (ref >60)
Glucose, Bld: 111 mg/dL — ABNORMAL HIGH (ref 70–99)
Potassium: 4.5 mmol/L (ref 3.5–5.1)
Sodium: 139 mmol/L (ref 135–145)

## 2019-07-22 LAB — CBC
HCT: 28.6 % — ABNORMAL LOW (ref 39.0–52.0)
Hemoglobin: 9.4 g/dL — ABNORMAL LOW (ref 13.0–17.0)
MCH: 32.9 pg (ref 26.0–34.0)
MCHC: 32.9 g/dL (ref 30.0–36.0)
MCV: 100 fL (ref 80.0–100.0)
Platelets: 171 K/uL (ref 150–400)
RBC: 2.86 MIL/uL — ABNORMAL LOW (ref 4.22–5.81)
RDW: 15.4 % (ref 11.5–15.5)
WBC: 7.1 K/uL (ref 4.0–10.5)
nRBC: 0 % (ref 0.0–0.2)

## 2019-07-22 LAB — SARS CORONAVIRUS 2 BY RT PCR (HOSPITAL ORDER, PERFORMED IN ~~LOC~~ HOSPITAL LAB): SARS Coronavirus 2: NEGATIVE

## 2019-07-22 MED ORDER — HYDROMORPHONE HCL 1 MG/ML IJ SOLN
0.5000 mg | INTRAMUSCULAR | Status: DC | PRN
  Administered 2019-07-23 – 2019-07-24 (×3): 0.5 mg via INTRAVENOUS
  Filled 2019-07-22 (×3): qty 1

## 2019-07-22 MED ORDER — ONDANSETRON HCL 4 MG/2ML IJ SOLN: 4.0000 mg | Freq: Four times a day (QID) | INTRAMUSCULAR | Status: DC | PRN

## 2019-07-22 MED ORDER — ALUM & MAG HYDROXIDE-SIMETH 200-200-20 MG/5ML PO SUSP: 15.0000 mL | ORAL | Status: DC | PRN

## 2019-07-22 MED ORDER — METOLAZONE 2.5 MG PO TABS
2.5000 mg | ORAL_TABLET | Freq: Every day | ORAL | Status: DC | PRN
  Filled 2019-07-22: qty 1

## 2019-07-22 MED ORDER — DUTASTERIDE 0.5 MG PO CAPS
0.5000 mg | ORAL_CAPSULE | Freq: Every day | ORAL | Status: DC
  Administered 2019-07-24 – 2019-07-26 (×3): 0.5 mg via ORAL
  Filled 2019-07-22 (×4): qty 1

## 2019-07-22 MED ORDER — VANCOMYCIN HCL IN DEXTROSE 1-5 GM/200ML-% IV SOLN
1000.0000 mg | Freq: Once | INTRAVENOUS | Status: AC
  Administered 2019-07-22: 1000 mg via INTRAVENOUS
  Filled 2019-07-22: qty 200

## 2019-07-22 MED ORDER — SODIUM CHLORIDE 0.9 % IV SOLN: INTRAVENOUS | Status: DC

## 2019-07-22 MED ORDER — CALCIUM CARBONATE ANTACID 500 MG PO CHEW: 1.0000 | CHEWABLE_TABLET | Freq: Three times a day (TID) | ORAL | Status: DC | PRN

## 2019-07-22 MED ORDER — ACETAMINOPHEN 650 MG RE SUPP: 325.0000 mg | RECTAL | Status: DC | PRN

## 2019-07-22 MED ORDER — POTASSIUM CHLORIDE CRYS ER 20 MEQ PO TBCR
20.0000 meq | EXTENDED_RELEASE_TABLET | Freq: Once | ORAL | Status: DC
  Filled 2019-07-22: qty 1

## 2019-07-22 MED ORDER — SENNOSIDES-DOCUSATE SODIUM 8.6-50 MG PO TABS
1.0000 | ORAL_TABLET | Freq: Every day | ORAL | Status: DC
  Administered 2019-07-23 – 2019-07-25 (×3): 1 via ORAL
  Filled 2019-07-22 (×4): qty 1

## 2019-07-22 MED ORDER — GABAPENTIN 600 MG PO TABS
600.0000 mg | ORAL_TABLET | Freq: Every day | ORAL | Status: DC | PRN
  Administered 2019-07-22 – 2019-07-25 (×3): 600 mg via ORAL
  Filled 2019-07-22 (×3): qty 1

## 2019-07-22 MED ORDER — SUCRALFATE 1 GM/10ML PO SUSP
1.0000 g | Freq: Three times a day (TID) | ORAL | Status: DC
  Administered 2019-07-22 – 2019-07-26 (×13): 1 g via ORAL
  Filled 2019-07-22 (×13): qty 10

## 2019-07-22 MED ORDER — PIPERACILLIN-TAZOBACTAM 3.375 G IVPB
3.3750 g | Freq: Three times a day (TID) | INTRAVENOUS | Status: DC
  Administered 2019-07-23 – 2019-07-26 (×11): 3.375 g via INTRAVENOUS
  Filled 2019-07-22 (×11): qty 50

## 2019-07-22 MED ORDER — ADULT MULTIVITAMIN W/MINERALS CH
1.0000 | ORAL_TABLET | Freq: Every evening | ORAL | Status: DC
  Administered 2019-07-22 – 2019-07-25 (×4): 1 via ORAL
  Filled 2019-07-22 (×4): qty 1

## 2019-07-22 MED ORDER — LORATADINE 10 MG PO TABS: 10.0000 mg | ORAL_TABLET | Freq: Every day | ORAL | Status: DC | PRN

## 2019-07-22 MED ORDER — PHENOL 1.4 % MT LIQD: 1.0000 | OROMUCOSAL | Status: DC | PRN

## 2019-07-22 MED ORDER — METOPROLOL TARTRATE 5 MG/5ML IV SOLN: 2.0000 mg | INTRAVENOUS | Status: DC | PRN

## 2019-07-22 MED ORDER — GUAIFENESIN-DM 100-10 MG/5ML PO SYRP: 15.0000 mL | ORAL_SOLUTION | ORAL | Status: DC | PRN

## 2019-07-22 MED ORDER — PANTOPRAZOLE SODIUM 40 MG PO TBEC
40.0000 mg | DELAYED_RELEASE_TABLET | Freq: Every day | ORAL | Status: DC
  Administered 2019-07-22 – 2019-07-26 (×4): 40 mg via ORAL
  Filled 2019-07-22 (×4): qty 1

## 2019-07-22 MED ORDER — ASPIRIN EC 81 MG PO TBEC
81.0000 mg | DELAYED_RELEASE_TABLET | Freq: Every day | ORAL | Status: DC
  Administered 2019-07-24 – 2019-07-26 (×3): 81 mg via ORAL
  Filled 2019-07-22 (×3): qty 1

## 2019-07-22 MED ORDER — DILTIAZEM HCL ER COATED BEADS 120 MG PO CP24
120.0000 mg | ORAL_CAPSULE | Freq: Every day | ORAL | Status: DC
  Administered 2019-07-24 – 2019-07-26 (×3): 120 mg via ORAL
  Filled 2019-07-22 (×3): qty 1

## 2019-07-22 MED ORDER — ACETAMINOPHEN 325 MG PO TABS
325.0000 mg | ORAL_TABLET | ORAL | Status: DC | PRN
  Administered 2019-07-24: 650 mg via ORAL
  Filled 2019-07-22 (×3): qty 1

## 2019-07-22 MED ORDER — RANOLAZINE ER 500 MG PO TB12
500.0000 mg | ORAL_TABLET | Freq: Two times a day (BID) | ORAL | Status: DC
  Administered 2019-07-22 – 2019-07-26 (×7): 500 mg via ORAL
  Filled 2019-07-22 (×9): qty 1

## 2019-07-22 MED ORDER — OXYCODONE HCL 5 MG PO TABS
5.0000 mg | ORAL_TABLET | ORAL | Status: DC | PRN
  Filled 2019-07-22: qty 2

## 2019-07-22 MED ORDER — TORSEMIDE 20 MG PO TABS
40.0000 mg | ORAL_TABLET | Freq: Two times a day (BID) | ORAL | Status: DC
  Administered 2019-07-22 – 2019-07-26 (×6): 40 mg via ORAL
  Filled 2019-07-22 (×7): qty 2

## 2019-07-22 MED ORDER — EZETIMIBE 10 MG PO TABS
10.0000 mg | ORAL_TABLET | Freq: Every day | ORAL | Status: DC
  Administered 2019-07-24 – 2019-07-26 (×3): 10 mg via ORAL
  Filled 2019-07-22 (×3): qty 1

## 2019-07-22 MED ORDER — LABETALOL HCL 5 MG/ML IV SOLN: 10.0000 mg | INTRAVENOUS | Status: DC | PRN

## 2019-07-22 MED ORDER — VANCOMYCIN HCL 750 MG/150ML IV SOLN: 750.0000 mg | INTRAVENOUS | Status: DC

## 2019-07-22 MED ORDER — HYDRALAZINE HCL 20 MG/ML IJ SOLN: 5.0000 mg | INTRAMUSCULAR | Status: DC | PRN

## 2019-07-22 MED ORDER — PRAVASTATIN SODIUM 40 MG PO TABS
40.0000 mg | ORAL_TABLET | Freq: Every day | ORAL | Status: DC
  Administered 2019-07-23 – 2019-07-25 (×3): 40 mg via ORAL
  Filled 2019-07-22 (×3): qty 1

## 2019-07-22 MED ORDER — NITROGLYCERIN 0.4 MG/SPRAY TL SOLN
1.0000 | Status: DC | PRN
  Filled 2019-07-22: qty 4.9

## 2019-07-22 MED ORDER — PIPERACILLIN-TAZOBACTAM 3.375 G IVPB 30 MIN
3.3750 g | Freq: Once | INTRAVENOUS | Status: AC
  Administered 2019-07-22: 3.375 g via INTRAVENOUS
  Filled 2019-07-22 (×2): qty 50

## 2019-07-22 NOTE — Progress Notes (Addendum)
Pharmacy Antibiotic Note  [name] is a 74 y.o. male admitted on 07/22/2019 with post surgical infection.  Pharmacy has been consulted for vanc/zosyn dosing.  Pt is s/p L fem-pop on 5/10. He was dc home before drainage from groin. He had low grade fever at home. Plan to take back to OR for I&D. Empiric vanc/zosyn. Consider using vanc/ceftriaxone/flagyl instead due to his CRI.   Scr 1.42 (5/10) CrCl ~ 35 ml/min  Plan: Bmet in AM Vanc 1g IV x1 then 750mg  IV q24 Zosyn 3.375g IV x1 over 9mins then 3.375g IV q8 EI Level as needed Consider changing zosyn to ceftriaxone/flagyl     Temp (24hrs), Avg:97.6 F (36.4 C), Min:97.3 F (36.3 C), Max:97.9 F (36.6 C)  Recent Labs  Lab 07/15/19 2350  WBC 8.0  CREATININE 1.42*    Estimated Creatinine Clearance: 35.2 mL/min (A) (by C-G formula based on SCr of 1.42 mg/dL (H)).    Allergies  Allergen Reactions  . Codeine Nausea And Vomiting  . Sulfa Antibiotics Rash  . Sulfonamide Derivatives Rash    Antimicrobials this admission: 5/17 vanc>> 5/17 zosyn>>  Dose adjustments this admission:   Microbiology results:   Onnie Boer, PharmD, BCIDP, AAHIVP, CPP Infectious Disease Pharmacist 07/22/2019 5:56 PM

## 2019-07-22 NOTE — Telephone Encounter (Signed)
Telephone call received from pts wife. Reports yesterday left groin incision began  leaking clear fluid and blood soaking into clothes. Feels incision or something has come undone. Reports low grade temp 99.7 on Saturday. Discussed with Elliot Dally., PA.  Pt scheduled for wound check with PA on today at 3:15. Wife verbalized understanding. Minette Brine, RN

## 2019-07-22 NOTE — Progress Notes (Signed)
POST OPERATIVE OFFICE NOTE    CC:  F/u for surgery  HPI:  This is a 74 y.o. male who is s/p Left to right femoral-femoral bypass, right common femoral endarterectomy with profundoplasty on 07/15/2019 by Dr. Oneida Alar.  His post operative course was unremarkable with exception of nausea and he was discharged on POD 3.    He comes in today with complaints of left groin swelling.  He states that last night, he had to change his underwear and pajamas a couple of times bc they became saturated with drainage.  He states the drainage is a clear red tinged liquid.  He states he had a low grade fever of 99.7 on Saturday but has not had any chills or fever since.  He states that his right foot feels better and only has occasional twinges since his surgery.  He states he tries to float his heels at night but when he is asleep, he kicks the pillow out.  He states he sleeps on his left side.    Allergies  Allergen Reactions  . Codeine Nausea And Vomiting  . Sulfa Antibiotics Rash  . Sulfonamide Derivatives Rash    Current Outpatient Medications  Medication Sig Dispense Refill  . aspirin EC 81 MG tablet Take 1 tablet (81 mg total) by mouth daily. 150 tablet 2  . butalbital-aspirin-caffeine (FIORINAL) 50-325-40 MG capsule Take 1 capsule by mouth daily as needed for headache.     . calcium carbonate (TUMS - DOSED IN MG ELEMENTAL CALCIUM) 500 MG chewable tablet Chew 1 tablet (200 mg of elemental calcium total) by mouth 3 (three) times daily as needed for indigestion or heartburn. 30 tablet 0  . diltiazem (CARDIZEM CD) 120 MG 24 hr capsule TAKE 1 CAPSULE BY MOUTH EVERY DAY (Patient taking differently: Take 120 mg by mouth daily. ) 90 capsule 1  . dutasteride (AVODART) 0.5 MG capsule Take 0.5 mg by mouth daily.     Marland Kitchen esomeprazole (NEXIUM) 40 MG capsule Take 1 capsule (40 mg total) by mouth 2 (two) times daily before a meal. 120 capsule 3  . ezetimibe (ZETIA) 10 MG tablet TAKE 1 TABLET (10 MG TOTAL) BY MOUTH  DAILY. 90 tablet 0  . Gabapentin Enacarbil (HORIZANT) 600 MG TBCR Take 600 mg by mouth daily as needed (restless leg). (1700)    . Ketotifen Fumarate (ALAWAY OP) Place 1 drop into the right eye daily. Uses before putting contact Lens in R eye    . loratadine (CLARITIN) 10 MG tablet Take 10 mg by mouth daily as needed for allergies, rhinitis or itching.   3  . lovastatin (MEVACOR) 40 MG tablet TAKE 1 TABLET BY MOUTH AT BEDTIME (Patient taking differently: Take 20 mg by mouth at bedtime. ) 90 tablet 3  . metolazone (ZAROXOLYN) 2.5 MG tablet Take 2.5 mg by mouth daily as needed (fluid retention).     . Multiple Vitamins-Minerals (MULTIVITAMINS THER. W/MINERALS) TABS Take 1 tablet by mouth every evening.     . nitroGLYCERIN (NITROLINGUAL) 0.4 MG/SPRAY spray Place 1 spray under the tongue every 5 (five) minutes x 3 doses as needed for chest pain. 4.9 g 2  . oxyCODONE (OXY IR/ROXICODONE) 5 MG immediate release tablet Take 1 tablet (5 mg total) by mouth every 6 (six) hours as needed for moderate pain. 20 tablet 0  . Potassium Chloride ER 20 MEQ TBCR Take 20 mEq by mouth every evening.     . ranolazine (RANEXA) 500 MG 12 hr tablet Take 500 mg  by mouth 2 (two) times daily.    Marland Kitchen senna-docusate (SENOKOT-S) 8.6-50 MG tablet Take 1 tablet by mouth daily. (Patient taking differently: Take 1 tablet by mouth at bedtime. ) 30 tablet 6  . sucralfate (CARAFATE) 1 GM/10ML suspension Take 10 mLs (1 g total) by mouth 4 (four) times daily -  with meals and at bedtime. (Patient taking differently: Take 1 g by mouth in the morning, at noon, in the evening, and at bedtime. ) 1200 mL 11  . torsemide (DEMADEX) 20 MG tablet Take 40 mg by mouth 2 (two) times daily. Take 40 mg by mouth 2 times a day. For swelling or weight gain    . vitamin E 200 UNIT capsule Take 200 Units by mouth every evening.      No current facility-administered medications for this visit.     ROS:  See HPI  Physical Exam:  Today's Vitals   07/22/19  1519  BP: (!) 105/58  Pulse: 83  Resp: 16  Temp: (!) 97.3 F (36.3 C)  TempSrc: Temporal  SpO2: 100%  Weight: 122 lb (55.3 kg)  Height: 5\' 5"  (1.651 m)  PainSc: 7    Body mass index is 20.3 kg/m.   Incision:  Right groin incision is healing nicely.  The left groin incision is intact with some erythema and lymphocele present.  There is also fluid around the femoral to femoral bypass graft.  Extremities:  Monophasic doppler signals right AT/PT/peronea and left DP/peroneal Abdomen:  Soft, NT/ND   Assessment/Plan:  This is a 74 y.o. male who is s/p: Left to right femoral-femoral bypass, right common femoral endarterectomy with profundoplasty who presents today with swelling and drainage and redness from left groin  -pt seen and examined with Dr. Trula Slade.  Pt has significant swelling over the left groin as well as the fem fem graft.  Dr. Trula Slade used u/s to evaluate and there is fluid from about mid graft over to left groin.  Fluid is serosanguinous per pt and his wife's report.  He does have some erythema over the left groin and had a low grade fever on Saturday. -will admit pt to hospital for IV abx and plan for I&D of left groin tomorrow.    Leontine Locket, [facility] [phone]  Clinic MD:  Pt seen and examined with Dr. Trula Slade

## 2019-07-22 NOTE — H&P (View-Only) (Signed)
POST OPERATIVE OFFICE NOTE    CC:  F/u for surgery  HPI:  This is a 74 y.o. male who is s/p Left to right femoral-femoral bypass, right common femoral endarterectomy with profundoplasty on 07/15/2019 by Dr. Oneida Alar.  His post operative course was unremarkable with exception of nausea and he was discharged on POD 3.    He comes in today with complaints of left groin swelling.  He states that last night, he had to change his underwear and pajamas a couple of times bc they became saturated with drainage.  He states the drainage is a clear red tinged liquid.  He states he had a low grade fever of 99.7 on Saturday but has not had any chills or fever since.  He states that his right foot feels better and only has occasional twinges since his surgery.  He states he tries to float his heels at night but when he is asleep, he kicks the pillow out.  He states he sleeps on his left side.    Allergies  Allergen Reactions  . Codeine Nausea And Vomiting  . Sulfa Antibiotics Rash  . Sulfonamide Derivatives Rash    Current Outpatient Medications  Medication Sig Dispense Refill  . aspirin EC 81 MG tablet Take 1 tablet (81 mg total) by mouth daily. 150 tablet 2  . butalbital-aspirin-caffeine (FIORINAL) 50-325-40 MG capsule Take 1 capsule by mouth daily as needed for headache.     . calcium carbonate (TUMS - DOSED IN MG ELEMENTAL CALCIUM) 500 MG chewable tablet Chew 1 tablet (200 mg of elemental calcium total) by mouth 3 (three) times daily as needed for indigestion or heartburn. 30 tablet 0  . diltiazem (CARDIZEM CD) 120 MG 24 hr capsule TAKE 1 CAPSULE BY MOUTH EVERY DAY (Patient taking differently: Take 120 mg by mouth daily. ) 90 capsule 1  . dutasteride (AVODART) 0.5 MG capsule Take 0.5 mg by mouth daily.     Marland Kitchen esomeprazole (NEXIUM) 40 MG capsule Take 1 capsule (40 mg total) by mouth 2 (two) times daily before a meal. 120 capsule 3  . ezetimibe (ZETIA) 10 MG tablet TAKE 1 TABLET (10 MG TOTAL) BY MOUTH  DAILY. 90 tablet 0  . Gabapentin Enacarbil (HORIZANT) 600 MG TBCR Take 600 mg by mouth daily as needed (restless leg). (1700)    . Ketotifen Fumarate (ALAWAY OP) Place 1 drop into the right eye daily. Uses before putting contact Lens in R eye    . loratadine (CLARITIN) 10 MG tablet Take 10 mg by mouth daily as needed for allergies, rhinitis or itching.   3  . lovastatin (MEVACOR) 40 MG tablet TAKE 1 TABLET BY MOUTH AT BEDTIME (Patient taking differently: Take 20 mg by mouth at bedtime. ) 90 tablet 3  . metolazone (ZAROXOLYN) 2.5 MG tablet Take 2.5 mg by mouth daily as needed (fluid retention).     . Multiple Vitamins-Minerals (MULTIVITAMINS THER. W/MINERALS) TABS Take 1 tablet by mouth every evening.     . nitroGLYCERIN (NITROLINGUAL) 0.4 MG/SPRAY spray Place 1 spray under the tongue every 5 (five) minutes x 3 doses as needed for chest pain. 4.9 g 2  . oxyCODONE (OXY IR/ROXICODONE) 5 MG immediate release tablet Take 1 tablet (5 mg total) by mouth every 6 (six) hours as needed for moderate pain. 20 tablet 0  . Potassium Chloride ER 20 MEQ TBCR Take 20 mEq by mouth every evening.     . ranolazine (RANEXA) 500 MG 12 hr tablet Take 500 mg  by mouth 2 (two) times daily.    Marland Kitchen senna-docusate (SENOKOT-S) 8.6-50 MG tablet Take 1 tablet by mouth daily. (Patient taking differently: Take 1 tablet by mouth at bedtime. ) 30 tablet 6  . sucralfate (CARAFATE) 1 GM/10ML suspension Take 10 mLs (1 g total) by mouth 4 (four) times daily -  with meals and at bedtime. (Patient taking differently: Take 1 g by mouth in the morning, at noon, in the evening, and at bedtime. ) 1200 mL 11  . torsemide (DEMADEX) 20 MG tablet Take 40 mg by mouth 2 (two) times daily. Take 40 mg by mouth 2 times a day. For swelling or weight gain    . vitamin E 200 UNIT capsule Take 200 Units by mouth every evening.      No current facility-administered medications for this visit.     ROS:  See HPI  Physical Exam:  Today's Vitals   07/22/19  1519  BP: (!) 105/58  Pulse: 83  Resp: 16  Temp: (!) 97.3 F (36.3 C)  TempSrc: Temporal  SpO2: 100%  Weight: 122 lb (55.3 kg)  Height: 5\' 5"  (1.651 m)  PainSc: 7    Body mass index is 20.3 kg/m.   Incision:  Right groin incision is healing nicely.  The left groin incision is intact with some erythema and lymphocele present.  There is also fluid around the femoral to femoral bypass graft.  Extremities:  Monophasic doppler signals right AT/PT/peronea and left DP/peroneal Abdomen:  Soft, NT/ND   Assessment/Plan:  This is a 74 y.o. male who is s/p: Left to right femoral-femoral bypass, right common femoral endarterectomy with profundoplasty who presents today with swelling and drainage and redness from left groin  -pt seen and examined with Dr. Trula Slade.  Pt has significant swelling over the left groin as well as the fem fem graft.  Dr. Trula Slade used u/s to evaluate and there is fluid from about mid graft over to left groin.  Fluid is serosanguinous per pt and his wife's report.  He does have some erythema over the left groin and had a low grade fever on Saturday. -will admit pt to hospital for IV abx and plan for I&D of left groin tomorrow.    Leontine Locket, [facility] [phone]  Clinic MD:  Pt seen and examined with Dr. Trula Slade

## 2019-07-23 ENCOUNTER — Inpatient Hospital Stay (HOSPITAL_COMMUNITY): Payer: Medicare PPO | Admitting: Certified Registered"

## 2019-07-23 ENCOUNTER — Encounter (HOSPITAL_COMMUNITY)
Admission: AD | Disposition: A | Discharge status: Home / Self Care | Payer: Self-pay | Source: Home / Self Care | Attending: Vascular Surgery

## 2019-07-23 ENCOUNTER — Inpatient Hospital Stay: Admit: 2019-07-23 | Payer: Medicare PPO | Admitting: Vascular Surgery

## 2019-07-23 ENCOUNTER — Encounter (HOSPITAL_COMMUNITY): Payer: Self-pay | Admitting: Vascular Surgery

## 2019-07-23 DIAGNOSIS — I70222 Atherosclerosis of native arteries of extremities with rest pain, left leg: Secondary | ICD-10-CM

## 2019-07-23 HISTORY — PX: APPLICATION OF WOUND VAC: SHX5189

## 2019-07-23 HISTORY — PX: WOUND DEBRIDEMENT: SHX247

## 2019-07-23 LAB — CBC
HCT: 24.9 % — ABNORMAL LOW (ref 39.0–52.0)
Hemoglobin: 8.3 g/dL — ABNORMAL LOW (ref 13.0–17.0)
MCH: 32.8 pg (ref 26.0–34.0)
MCHC: 33.3 g/dL (ref 30.0–36.0)
MCV: 98.4 fL (ref 80.0–100.0)
Platelets: 148 10*3/uL — ABNORMAL LOW (ref 150–400)
RBC: 2.53 MIL/uL — ABNORMAL LOW (ref 4.22–5.81)
RDW: 15.3 % (ref 11.5–15.5)
WBC: 6.1 10*3/uL (ref 4.0–10.5)
nRBC: 0 % (ref 0.0–0.2)

## 2019-07-23 LAB — BASIC METABOLIC PANEL
Anion gap: 10 (ref 5–15)
BUN: 24 mg/dL — ABNORMAL HIGH (ref 8–23)
CO2: 25 mmol/L (ref 22–32)
Calcium: 8.5 mg/dL — ABNORMAL LOW (ref 8.9–10.3)
Chloride: 103 mmol/L (ref 98–111)
Creatinine, Ser: 1.51 mg/dL — ABNORMAL HIGH (ref 0.61–1.24)
GFR calc Af Amer: 52 mL/min — ABNORMAL LOW (ref >60)
GFR calc non Af Amer: 45 mL/min — ABNORMAL LOW (ref >60)
Glucose, Bld: 112 mg/dL — ABNORMAL HIGH (ref 70–99)
Potassium: 3.6 mmol/L (ref 3.5–5.1)
Sodium: 138 mmol/L (ref 135–145)

## 2019-07-23 SURGERY — DEBRIDEMENT, WOUND
Anesthesia: General | Site: Groin | Laterality: Left

## 2019-07-23 MED ORDER — LIDOCAINE HCL (CARDIAC) PF 100 MG/5ML IV SOSY
PREFILLED_SYRINGE | INTRAVENOUS | Status: DC | PRN
  Administered 2019-07-23: 40 mg via INTRATRACHEAL

## 2019-07-23 MED ORDER — LACTATED RINGERS IV SOLN: INTRAVENOUS | Status: DC

## 2019-07-23 MED ORDER — FENTANYL CITRATE (PF) 100 MCG/2ML IJ SOLN
25.0000 ug | INTRAMUSCULAR | Status: DC | PRN
  Administered 2019-07-23: 25 ug via INTRAVENOUS
  Administered 2019-07-23: 50 ug via INTRAVENOUS
  Administered 2019-07-23 (×2): 25 ug via INTRAVENOUS

## 2019-07-23 MED ORDER — FENTANYL CITRATE (PF) 100 MCG/2ML IJ SOLN
INTRAMUSCULAR | Status: AC
  Filled 2019-07-23: qty 2

## 2019-07-23 MED ORDER — ALBUMIN HUMAN 5 % IV SOLN
12.5000 g | Freq: Once | INTRAVENOUS | Status: AC
  Administered 2019-07-23: 12.5 g via INTRAVENOUS

## 2019-07-23 MED ORDER — FENTANYL CITRATE (PF) 250 MCG/5ML IJ SOLN
INTRAMUSCULAR | Status: AC
  Filled 2019-07-23: qty 5

## 2019-07-23 MED ORDER — POTASSIUM CHLORIDE ER 10 MEQ PO TBCR
20.0000 meq | EXTENDED_RELEASE_TABLET | Freq: Every evening | ORAL | Status: DC
  Administered 2019-07-23 – 2019-07-25 (×3): 20 meq via ORAL
  Filled 2019-07-23 (×9): qty 2

## 2019-07-23 MED ORDER — VANCOMYCIN HCL 1250 MG/250ML IV SOLN: 1250.0000 mg | INTRAVENOUS | Status: DC

## 2019-07-23 MED ORDER — PHENYLEPHRINE HCL-NACL 10-0.9 MG/250ML-% IV SOLN
INTRAVENOUS | Status: DC | PRN
  Administered 2019-07-23: 65 ug/min via INTRAVENOUS

## 2019-07-23 MED ORDER — ONDANSETRON HCL 4 MG/2ML IJ SOLN: 4.0000 mg | Freq: Once | INTRAMUSCULAR | Status: DC | PRN

## 2019-07-23 MED ORDER — PHENYLEPHRINE HCL (PRESSORS) 10 MG/ML IV SOLN
INTRAVENOUS | Status: DC | PRN
  Administered 2019-07-23 (×2): 120 ug via INTRAVENOUS

## 2019-07-23 MED ORDER — 0.9 % SODIUM CHLORIDE (POUR BTL) OPTIME
TOPICAL | Status: DC | PRN
  Administered 2019-07-23: 1000 mL

## 2019-07-23 MED ORDER — ALBUMIN HUMAN 5 % IV SOLN
INTRAVENOUS | Status: AC
  Filled 2019-07-23: qty 250

## 2019-07-23 MED ORDER — ONDANSETRON HCL 4 MG/2ML IJ SOLN
INTRAMUSCULAR | Status: DC | PRN
  Administered 2019-07-23: 4 mg via INTRAVENOUS

## 2019-07-23 MED ORDER — FENTANYL CITRATE (PF) 100 MCG/2ML IJ SOLN
INTRAMUSCULAR | Status: DC | PRN
  Administered 2019-07-23 (×2): 50 ug via INTRAVENOUS
  Administered 2019-07-23 (×2): 25 ug via INTRAVENOUS

## 2019-07-23 MED ORDER — PROPOFOL 10 MG/ML IV BOLUS
INTRAVENOUS | Status: DC | PRN
  Administered 2019-07-23: 100 mg via INTRAVENOUS

## 2019-07-23 MED ORDER — PROPOFOL 10 MG/ML IV BOLUS
INTRAVENOUS | Status: AC
  Filled 2019-07-23: qty 20

## 2019-07-23 MED ORDER — VANCOMYCIN HCL 500 MG/100ML IV SOLN
500.0000 mg | INTRAVENOUS | Status: DC
  Administered 2019-07-23 – 2019-07-25 (×3): 500 mg via INTRAVENOUS
  Filled 2019-07-23 (×4): qty 100

## 2019-07-23 MED ORDER — DEXMEDETOMIDINE HCL IN NACL 80 MCG/20ML IV SOLN
INTRAVENOUS | Status: AC
  Filled 2019-07-23: qty 20

## 2019-07-23 SURGICAL SUPPLY — 43 items
BNDG ELASTIC 4X5.8 VLCR STR LF (GAUZE/BANDAGES/DRESSINGS) IMPLANT
BNDG ELASTIC 6X5.8 VLCR STR LF (GAUZE/BANDAGES/DRESSINGS) IMPLANT
BNDG GAUZE ELAST 4 BULKY (GAUZE/BANDAGES/DRESSINGS) IMPLANT
CANISTER SUCT 3000ML PPV (MISCELLANEOUS) ×3 IMPLANT
CLIP VESOCCLUDE MED 6/CT (CLIP) ×2 IMPLANT
COVER SURGICAL LIGHT HANDLE (MISCELLANEOUS) ×3 IMPLANT
COVER WAND RF STERILE (DRAPES) ×3 IMPLANT
DRAPE HALF SHEET 40X57 (DRAPES) ×3 IMPLANT
DRAPE INCISE IOBAN 66X45 STRL (DRAPES) ×3 IMPLANT
DRAPE ORTHO SPLIT 77X108 STRL (DRAPES) ×6
DRAPE SURG ORHT 6 SPLT 77X108 (DRAPES) ×2 IMPLANT
DRESSING PREVENA PLUS CUSTOM (GAUZE/BANDAGES/DRESSINGS) IMPLANT
DRSG PREVENA PLUS CUSTOM (GAUZE/BANDAGES/DRESSINGS) ×3
ELECT REM PT RETURN 9FT ADLT (ELECTROSURGICAL) ×3
ELECTRODE REM PT RTRN 9FT ADLT (ELECTROSURGICAL) ×1 IMPLANT
GAUZE SPONGE 4X4 12PLY STRL (GAUZE/BANDAGES/DRESSINGS) ×3 IMPLANT
GLOVE BIO SURGEON STRL SZ7.5 (GLOVE) ×3 IMPLANT
GLOVE BIOGEL PI IND STRL 8 (GLOVE) ×1 IMPLANT
GLOVE BIOGEL PI INDICATOR 8 (GLOVE) ×2
GOWN STRL REUS W/ TWL LRG LVL3 (GOWN DISPOSABLE) ×3 IMPLANT
GOWN STRL REUS W/TWL LRG LVL3 (GOWN DISPOSABLE) ×9
KIT BASIN OR (CUSTOM PROCEDURE TRAY) ×3 IMPLANT
KIT PREVENA INCISION MGT 13 (CANNISTER) ×2 IMPLANT
KIT TURNOVER KIT B (KITS) ×3 IMPLANT
NS IRRIG 1000ML POUR BTL (IV SOLUTION) ×3 IMPLANT
PACK CV ACCESS (CUSTOM PROCEDURE TRAY) IMPLANT
PACK GENERAL/GYN (CUSTOM PROCEDURE TRAY) IMPLANT
PAD ARMBOARD 7.5X6 YLW CONV (MISCELLANEOUS) ×6 IMPLANT
PENCIL BUTTON HOLSTER BLD 10FT (ELECTRODE) ×2 IMPLANT
STAPLER VISISTAT 35W (STAPLE) ×2 IMPLANT
SUT ETHILON 3 0 PS 1 (SUTURE) IMPLANT
SUT SILK 2 0 (SUTURE) ×3
SUT SILK 2-0 18XBRD TIE 12 (SUTURE) IMPLANT
SUT SILK 3 0 (SUTURE) ×3
SUT SILK 3-0 18XBRD TIE 12 (SUTURE) IMPLANT
SUT VIC AB 2-0 CT1 27 (SUTURE) ×3
SUT VIC AB 2-0 CT1 TAPERPNT 27 (SUTURE) IMPLANT
SUT VIC AB 2-0 CTB1 (SUTURE) IMPLANT
SUT VIC AB 3-0 SH 27 (SUTURE) ×9
SUT VIC AB 3-0 SH 27X BRD (SUTURE) IMPLANT
SUT VICRYL 4-0 PS2 18IN ABS (SUTURE) IMPLANT
TOWEL GREEN STERILE (TOWEL DISPOSABLE) ×3 IMPLANT
WATER STERILE IRR 1000ML POUR (IV SOLUTION) ×3 IMPLANT

## 2019-07-23 NOTE — Interval H&P Note (Signed)
History and Physical Interval Note:  07/23/2019 10:56 AM  [place]  has presented today for surgery, with the diagnosis of NON HEALING WOUND.  The various methods of treatment have been discussed with the patient and family. After consideration of risks, benefits and other options for treatment, the patient has consented to  Procedure(s): INCISION AND DRAINAGE LEFT GROIN WOUND (Left) as a surgical intervention.  The patient's history has been reviewed, patient examined, no change in status, stable for surgery.  I have reviewed the patient's chart and labs.  Questions were answered to the patient's satisfaction.     Deitra Mayo

## 2019-07-23 NOTE — Progress Notes (Addendum)
Pharmacy Antibiotic Note  [name] is a 74 y.o. male admitted on 07/22/2019 with post surgical infection. Pharmacy has been consulted for vanc/zosyn dosing. Pt is s/p L fem-pop on 5/10. Now with drainage and low grade fever at home. Plan to take back to OR for I&D today. Pt is afebrile and WBC is WNL. SCr is elevated at 1.51.  Plan: Change vancomycin to 500mg  IV Q48H Continue zosyn 3.375gm IV Q8H (4 hr inf) F/u renal fxn, C&S, clinical status and peak/trough at SS    Temp (24hrs), Avg:98.5 F (36.9 C), Min:97.3 F (36.3 C), Max:99.7 F (37.6 C)  Recent Labs  Lab 07/22/19 1800 07/23/19 0234  WBC 7.1 6.1  CREATININE 1.45* 1.51*    Estimated Creatinine Clearance: 33.1 mL/min (A) (by C-G formula based on SCr of 1.51 mg/dL (H)).    Allergies  Allergen Reactions  . Codeine Nausea And Vomiting  . Sulfa Antibiotics Rash  . Sulfonamide Derivatives Rash    Antimicrobials this admission: 5/17 vanc>> 5/17 zosyn>>  Dose adjustments this admission:   Microbiology results: Pending   Salome Arnt, PharmD, BCPS Clinical Pharmacist Please see AMION for all pharmacy numbers 07/23/2019 8:51 AM

## 2019-07-23 NOTE — Transfer of Care (Signed)
Immediate Anesthesia Transfer of Care Note  Patient: Mavin Dyke  Procedure(s) Performed: INCISION AND DRAINAGE LEFT GROIN WOUND (Left Groin) Application Of Wound Vac (Left Groin)  Patient Location: PACU  Anesthesia Type:General  Level of Consciousness: awake, alert  and sedated  Airway & Oxygen Therapy: Patient connected to face mask oxygen  Post-op Assessment: Post -op Vital signs reviewed and stable  Post vital signs: stable  Last Vitals:  Vitals Value Taken Time  BP 135/81 07/23/19 1235  Temp    Pulse 81 07/23/19 1236  Resp 9 07/23/19 1236  SpO2 100 % 07/23/19 1236  Vitals shown include unvalidated device data.  Last Pain:  Vitals:   07/23/19 0830  TempSrc:   PainSc: 0-No pain         Complications: No apparent anesthesia complications

## 2019-07-23 NOTE — Anesthesia Procedure Notes (Signed)
Procedure Name: LMA Insertion Date/Time: 07/23/2019 11:25 AM Performed by: Lavell Luster, CRNA Pre-anesthesia Checklist: Patient identified, Emergency Drugs available, Suction available, Patient being monitored and Timeout performed Patient Re-evaluated:Patient Re-evaluated prior to induction Oxygen Delivery Method: Circle system utilized Preoxygenation: Pre-oxygenation with 100% oxygen Induction Type: IV induction Ventilation: Mask ventilation without difficulty LMA: LMA inserted LMA Size: 4.0 Number of attempts: 1 Placement Confirmation: ETT inserted through vocal cords under direct vision,  breath sounds checked- equal and bilateral and positive ETCO2 Tube secured with: Tape Dental Injury: Teeth and Oropharynx as per pre-operative assessment

## 2019-07-23 NOTE — Op Note (Signed)
    NAME: [name]    MRN: [id number] DOB: Sep 14, 1945    DATE OF OPERATION: 07/23/2019  PREOP DIAGNOSIS:    Lymphocele left groin  POSTOP DIAGNOSIS:    Same  PROCEDURE:    Evacuation of lymphocele left groin Placement of Prevena dressing  SURGEON: Judeth Cornfield. Scot Dock, MD  ASSIST: Leontine Locket, PA  ANESTHESIA: General  EBL: Minimal  INDICATIONS:    [name] is a 74 y.o. male who had recently undergone a left to right femoral-femoral bypass by Dr. Oneida Alar. He presented to the office with a large fluid accumulation in the left groin and along the tract of the graft. He presents for evacuation of the lymphocele.  FINDINGS:   There was one small area which appeared to be leaking lymphatic fluid on the left side of the femorofemoral tunnel. This was oversewn with a 3-0 Vicryl. Otherwise the wound was fairly clean without significant lymphatic drainage from the soft tissue or from the graft.  TECHNIQUE:   The patient was taken to the operating room and received a general anesthetic. Both groins the lower abdomen and thighs were prepped and draped in usual sterile fashion. I opened the previous incision in the left groin there was a large amount of lymphatic fluid which was evacuated. The wound was irrigated with copious amounts of saline. Upon careful exploration the only area that appeared to have some lymphatic drainage was on the left side of the tunnel where there was some lymphatic fluid leaking. I did place a 3-0 Vicryl to close this area. The wound was irrigated thoroughly. No other areas were identified. There was no seepage from the graft or from the soft tissue. I then closed the wound in 3 layers. There was deep layer of 2-0 Vicryl, a subcutaneous layer with 3-0 Vicryl. I then closed the skin loosely with staples leaving a small gap between the staples and then placed a Prevena on top of this to allow for any lymphatic drainage to see to the skin into  the [place]. Patient tolerated procedure well was transferred to recovery room in stable condition. All needle and sponge counts were correct.  Deitra Mayo, MD, FACS [facility]  DATE OF DICTATION:   07/23/2019

## 2019-07-23 NOTE — Anesthesia Preprocedure Evaluation (Signed)
Anesthesia Evaluation  Patient identified by MRN, date of birth, ID band Patient awake    Reviewed: Allergy & Precautions, NPO status , Patient's Chart, lab work & pertinent test results  Airway Mallampati: II  TM Distance: >3 FB Neck ROM: Full    Dental  (+) Edentulous Upper, Missing, Dental Advisory Given,    Pulmonary neg pulmonary ROS, former smoker,    Pulmonary exam normal breath sounds clear to auscultation       Cardiovascular hypertension, Pt. on medications + CAD, + Past MI, + Cardiac Stents, + CABG, + Peripheral Vascular Disease and +CHF  Normal cardiovascular exam+ dysrhythmias Atrial Fibrillation + Valvular Problems/Murmurs (mild AS) AS  Rhythm:Regular Rate:Normal  EKG: 07/05/19 ([facility]):  Afib at 93 bpm Right BBB Left posterior fascicular block Bifascicular block  Nuclear stress test 07/09/19: Nuclear stress EF: 48%. There was no ST segment deviation noted during stress. Defect 1: There is a large defect of severe severity present in the basal inferior, mid inferior and apical inferior location. Findings consistent with prior myocardial infarction with peri-infarct ischemia. This is a low risk study. The left ventricular ejection fraction is mildly decreased (45-54%). - Abnormal, low risk stress nuclear study with prior inferior infarct and mild peri-infarct ischemia. Gated ejection fraction 48% with inferior hypokinesis and mild left ventricular enlargement.  Echo 07/09/19: IMPRESSIONS  1. Left ventricular ejection fraction, by estimation, is 50 to 55%. The left ventricle has low normal function. The left ventricle has no regional wall motion abnormalities. There is mild left ventricular hypertrophy. Left ventricular diastolic parameters are indeterminate.  2. Right ventricular systolic function is normal. The right ventricular size is normal. There is mildly elevated pulmonary artery systolic  pressure.  3. Left atrial size was mildly dilated.  4. The mitral valve is normal in structure. Trivial mitral valve regurgitation. No evidence of mitral stenosis.  5. The aortic valve is normal in structure. Aortic valve regurgitation is not visualized. Mild aortic valve stenosis. Aortic valve mean gradient measures 10.8 mmHg. Aortic valve Vmax measures 2.07 m/s.  6. The inferior vena cava is dilated in size with <50% respiratory variability, suggesting right atrial pressure of 15 mmHg.  - Comparison(s): No significant change from prior study. Prior images reviewed side by side.   Abdominal Aortogram with BLE runoff 05/10/19: Findings:  1. Occlusion right external iliac artery 2. Occlusion right common femoral artery profunda femoris and superficial femoral artery 3. Occlusion left profunda femoris artery left superficial femoral artery 4. Very diseased reconstitution right below-knee popliteal with peroneal runoff 5. Reconstitution above-knee popliteal artery left leg with three-vessel runoff to the left foot 6. In-stent restenosis right renal artery stent 50%  Carotid US 10/26/18: Summary:  Right Carotid: Velocities in the right ICA are consistent with a 40-59% stenosis. The ECA appears <50% stenosed.  Left Carotid: Velocities in the left ICA are consistent with a 40-59%  stenosis. The ECA appears <50% stenosed.  Vertebrals: Right vertebral artery demonstrates antegrade flow. Left Vertebral artery demonstrates retrograde flow.  Subclavians: Bilateral subclavian arteries were stenotic.  Last echo 07/09/19:  1. Left ventricular ejection fraction, by estimation, is 50 to 55%. The  left ventricle has low normal function. The left ventricle has no regional  wall motion abnormalities. There is mild left ventricular hypertrophy.  Left ventricular diastolic  parameters are indeterminate.  2. Right ventricular systolic function is normal. The right ventricular  size is normal. There  is mildly elevated pulmonary artery systolic  pressure.  3. Left atrial size  was mildly dilated.  4. The mitral valve is normal in structure. Trivial mitral valve  regurgitation. No evidence of mitral stenosis.  5. The aortic valve is normal in structure. Aortic valve regurgitation is  not visualized. Mild aortic valve stenosis. Aortic valve mean gradient  measures 10.8 mmHg. Aortic valve Vmax measures 2.07 m/s.  6. The inferior vena cava is dilated in size with <50% respiratory  variability, suggesting right atrial pressure of 15 mmHg.    Neuro/Psych  Headaches, negative psych ROS   GI/Hepatic Neg liver ROS, hiatal hernia, PUD, GERD  Medicated,  Endo/Other  negative endocrine ROS  Renal/GU CRFRenal diseaseCr 1.5 Hx renal artery stenosis s/p stenting  negative genitourinary   Musculoskeletal  (+) Arthritis ,   Abdominal Normal abdominal exam  (+)   Peds  Hematology  (+) Blood dyscrasia, anemia , Type and screen specimen was + antibodies, so repeat T&S planned for day of surgery   Anesthesia Other Findings Non-healing wound left groin  Reproductive/Obstetrics negative OB ROS                             Anesthesia Physical Anesthesia Plan  ASA: IV  Anesthesia Plan: General   Post-op Pain Management:    Induction: Intravenous  PONV Risk Score and Plan: 2 and Ondansetron and Treatment may vary due to age or medical condition  Airway Management Planned: LMA  Additional Equipment: None  Intra-op Plan:   Post-operative Plan: Extubation in OR  Informed Consent: I have reviewed the patients History and Physical, chart, labs and discussed the procedure including the risks, benefits and alternatives for the proposed anesthesia with the patient or authorized representative who has indicated his/her understanding and acceptance.     Dental advisory given  Plan Discussed with: CRNA  Anesthesia Plan Comments:         Anesthesia  Quick Evaluation

## 2019-07-23 NOTE — Anesthesia Postprocedure Evaluation (Signed)
Anesthesia Post Note  Patient: Iris Tatsch  Procedure(s) Performed: INCISION AND DRAINAGE LEFT GROIN WOUND (Left Groin) Application Of Wound Vac (Left Groin)     Patient location during evaluation: PACU Anesthesia Type: General Level of consciousness: awake and alert Pain management: pain level controlled Vital Signs Assessment: post-procedure vital signs reviewed and stable Respiratory status: spontaneous breathing, nonlabored ventilation, respiratory function stable and patient connected to nasal cannula oxygen Cardiovascular status: blood pressure returned to baseline and stable Postop Assessment: no apparent nausea or vomiting Anesthetic complications: no    Last Vitals:  Vitals:   07/23/19 1800 07/23/19 1814  BP: 115/62 99/66  Pulse: 70 70  Resp:  18  Temp:  36.9 C  SpO2:  100%    Last Pain:  Vitals:   07/23/19 1814  TempSrc: Oral  PainSc:                  Gera Inboden COKER

## 2019-07-23 NOTE — Progress Notes (Signed)
  Day of Surgery Note    Subjective:  C/o post-op pain   Vitals:   07/23/19 1450 07/23/19 1513  BP: (!) 96/56 102/62  Pulse: 69 67  Resp: 19 20  Temp:  98 F (36.7 C)  SpO2: 100% 100%    Incisions:   Prevena dressing in place with good seal to  left groin Extremities:  Bilateral DP Doppler signals Cardiac:  RRR Lungs:  nonlabored    Assessment/Plan:  This is a 74 y.o. male who is s/p recent L > R fem-fem bypass with post-op f;uid collection of left groin.  S/p evacuation of lymphocele  -stable immediate post-op   Risa Grill, PA-C 07/23/2019 3:36 PM [phone]

## 2019-07-24 LAB — BASIC METABOLIC PANEL
Anion gap: 12 (ref 5–15)
BUN: 23 mg/dL (ref 8–23)
CO2: 22 mmol/L (ref 22–32)
Calcium: 8.6 mg/dL — ABNORMAL LOW (ref 8.9–10.3)
Chloride: 104 mmol/L (ref 98–111)
Creatinine, Ser: 1.58 mg/dL — ABNORMAL HIGH (ref 0.61–1.24)
GFR calc Af Amer: 49 mL/min — ABNORMAL LOW (ref >60)
GFR calc non Af Amer: 42 mL/min — ABNORMAL LOW (ref >60)
Glucose, Bld: 87 mg/dL (ref 70–99)
Potassium: 4.3 mmol/L (ref 3.5–5.1)
Sodium: 138 mmol/L (ref 135–145)

## 2019-07-24 LAB — CBC
HCT: 25.2 % — ABNORMAL LOW (ref 39.0–52.0)
Hemoglobin: 8.3 g/dL — ABNORMAL LOW (ref 13.0–17.0)
MCH: 32.8 pg (ref 26.0–34.0)
MCHC: 32.9 g/dL (ref 30.0–36.0)
MCV: 99.6 fL (ref 80.0–100.0)
Platelets: 151 10*3/uL (ref 150–400)
RBC: 2.53 MIL/uL — ABNORMAL LOW (ref 4.22–5.81)
RDW: 15.5 % (ref 11.5–15.5)
WBC: 6.2 10*3/uL (ref 4.0–10.5)
nRBC: 0 % (ref 0.0–0.2)

## 2019-07-24 NOTE — Plan of Care (Signed)

## 2019-07-24 NOTE — Progress Notes (Signed)
[facility] [place]  Subjective  - pain in incisions   Objective 120/65 83 97.9 F (36.6 C) (Oral) 16 99%  Intake/Output Summary (Last 24 hours) at 07/24/2019 8563 Last data filed at 07/24/2019 0441 Gross per 24 hour  Intake 865.3 ml  Output 975 ml  Net -109.7 ml   Provena left groin Right groin incision no erythema or drainage Toes right foot look better   Assessment/Planning: OOB today Leave Provena in place for now Will need to watch for drainage 1-2 more days Chronic anemia trend  Ruta Hinds 07/24/2019 9:28 AM --  Laboratory Lab Results: Recent Labs    07/23/19 0234 07/24/19 0231  WBC 6.1 6.2  HGB 8.3* 8.3*  HCT 24.9* 25.2*  PLT 148* 151   BMET Recent Labs    07/23/19 0234 07/24/19 0231  NA 138 138  K 3.6 4.3  CL 103 104  CO2 25 22  GLUCOSE 112* 87  BUN 24* 23  CREATININE 1.51* 1.58*  CALCIUM 8.5* 8.6*    COAG Lab Results  Component Value Date   INR 1.1 07/11/2019   INR 1.08 01/24/2011   No results found for: PTT

## 2019-07-25 NOTE — Plan of Care (Signed)
  Problem: Safety: Goal: Ability to remain free from injury will improve Outcome: Progressing   Problem: Skin Integrity: Goal: Risk for impaired skin integrity will decrease Outcome: Progressing   

## 2019-07-25 NOTE — Progress Notes (Signed)
Pharmacy Antibiotic Note  Kellen Dutch is a 74 y.o. male admitted on 07/22/2019 with post surgical infection. Pharmacy has been consulted for vanc/zosyn dosing. Pt is s/p L fem-pop on 5/10. Now with drainage and low grade fever at home. S/p I&D. Wound culture is negative to date. Pt is afebrile and WBC is WNL. SCr is elevated at 1.58.   Plan: Continue vancomycin 500mg  IV Q24H Continue zosyn 3.375gm IV Q8H (4 hr inf) F/u renal fxn, C&S, clinical status and peak/trough at SS F/u planned LOT  Height: 5\' 5"  (165.1 cm) Weight: 55.3 kg (122 lb) IBW/kg (Calculated) : 61.5  Temp (24hrs), Avg:98.3 F (36.8 C), Min:98.1 F (36.7 C), Max:98.5 F (36.9 C)  Recent Labs  Lab 07/22/19 1800 07/23/19 0234 07/24/19 0231  WBC 7.1 6.1 6.2  CREATININE 1.45* 1.51* 1.58*    Estimated Creatinine Clearance: 31.6 mL/min (A) (by C-G formula based on SCr of 1.58 mg/dL (H)).    Allergies  Allergen Reactions  . Codeine Nausea And Vomiting  . Sulfa Antibiotics Rash  . Sulfonamide Derivatives Rash  . Oxycodone Nausea Only    Antimicrobials this admission: 5/17 vanc>> 5/17 zosyn>>  Microbiology results: 5/18 Wound - NGTD  Salome Arnt, PharmD, BCPS Clinical Pharmacist Please see AMION for all pharmacy numbers 07/25/2019 8:38 AM

## 2019-07-25 NOTE — Progress Notes (Addendum)
  Progress Note    07/25/2019 7:09 AM 2 Days Post-Op  Subjective:  Sleeping-awakes easily.  Says he's a little sore over left groin.  Denies pain in his feet.   Afebrile HR 80's afib 361'W systolic 43% RA  Vitals:   07/24/19 2033 07/25/19 0542  BP: (!) 102/55 102/63  Pulse: 85 81  Resp: 16 18  Temp: 98.2 F (36.8 C) 98.5 F (36.9 C)  SpO2: 100% 97%    Physical Exam: General:  No distress Lungs:  Non labored Incisions:  Left groin with Pravena vac with good seal; right groin clean and dry Extremities:  Bilateral feet are warm and well perfused; easily palpable fem fem graft pulse Abdomen:  Soft, NT  CBC    Component Value Date/Time   WBC 6.2 07/24/2019 0231   RBC 2.53 (L) 07/24/2019 0231   HGB 8.3 (L) 07/24/2019 0231   HGB 11.4 (L) 05/30/2017 1411   HCT 25.2 (L) 07/24/2019 0231   HCT 33.5 (L) 05/30/2017 1411   PLT 151 07/24/2019 0231   PLT 163 05/30/2017 1411   MCV 99.6 07/24/2019 0231   MCV 91 05/30/2017 1411   MCH 32.8 07/24/2019 0231   MCHC 32.9 07/24/2019 0231   RDW 15.5 07/24/2019 0231   RDW 15.1 05/30/2017 1411   LYMPHSABS 0.4 (L) 07/23/2018 2045   MONOABS 0.7 07/23/2018 2045   EOSABS 0.0 07/23/2018 2045   BASOSABS 0.0 07/23/2018 2045    BMET    Component Value Date/Time   NA 138 07/24/2019 0231   NA 134 12/07/2018 1200   K 4.3 07/24/2019 0231   CL 104 07/24/2019 0231   CO2 22 07/24/2019 0231   GLUCOSE 87 07/24/2019 0231   BUN 23 07/24/2019 0231   BUN 24 12/07/2018 1200   CREATININE 1.58 (H) 07/24/2019 0231   CREATININE 1.12 10/14/2015 1117   CALCIUM 8.6 (L) 07/24/2019 0231   GFRNONAA 42 (L) 07/24/2019 0231   GFRAA 49 (L) 07/24/2019 0231    INR    Component Value Date/Time   INR 1.1 07/11/2019 1112     Intake/Output Summary (Last 24 hours) at 07/25/2019 1540 Last data filed at 07/25/2019 0700 Gross per 24 hour  Intake 500 ml  Output 2595 ml  Net -2095 ml     Assessment:  74 y.o. male is s/p:  Evacuation of lymphocele left  groin Placement of Prevena dressing  2 Days Post-Op  Plan: -Pravena vac with good seal.  No drainage in canister.  Intra operative cx with no growth x 1 day.  -fem fem bypass with easily palpable graft pulse.  -afib-pt saw cardiology in April - he is rate controlled on diltiazem and not a candidate for [facility] given high bleeding risk and recurrent anemia.  Pt not on DVT prophylaxis.  Will d/w Dr. Oneida Alar if ok to start sq heparin.  If not, he will need to be wearing his SCD's. -pt has f/u appt with Dr. Oneida Alar on 6/3 with ABI's.  We will have him keep this appt.    Leontine Locket, PA-C [facility] [phone] 07/25/2019 7:09 AM  Less pain today Will look at incision tomorrow If no drainage will d/c home If has drainage may need home VAC Needs to get out of bed  Ruta Hinds, MD [facility] [place] Office: [phone]

## 2019-07-26 MED ORDER — CEPHALEXIN 500 MG PO CAPS: 500.0000 mg | ORAL_CAPSULE | Freq: Two times a day (BID) | ORAL | 0 refills | Status: AC

## 2019-07-26 MED ORDER — HYDROCODONE-ACETAMINOPHEN 5-325 MG PO TABS: 1.0000 | ORAL_TABLET | Freq: Four times a day (QID) | ORAL | 0 refills | Status: DC | PRN

## 2019-07-26 NOTE — Progress Notes (Addendum)
  Progress Note    07/26/2019 7:18 AM 3 Days Post-Op  Subjective:  OOB yesterday.  Patient anticipating discharge home today   Vitals:   07/26/19 0040 07/26/19 0426  BP: 111/70 122/75  Pulse: 75 80  Resp: 17 16  Temp: 98.7 F (37.1 C) 99.1 F (37.3 C)  SpO2: 97% 96%   Physical Exam: Incisions:  R groin c/d/i; L groin with prevena in place Extremities:  Feet symmetrically warm to touch; R foot wounds stable; palpable graft pulse Neurologic: A&O  CBC    Component Value Date/Time   WBC 6.2 07/24/2019 0231   RBC 2.53 (L) 07/24/2019 0231   HGB 8.3 (L) 07/24/2019 0231   HGB 11.4 (L) 05/30/2017 1411   HCT 25.2 (L) 07/24/2019 0231   HCT 33.5 (L) 05/30/2017 1411   PLT 151 07/24/2019 0231   PLT 163 05/30/2017 1411   MCV 99.6 07/24/2019 0231   MCV 91 05/30/2017 1411   MCH 32.8 07/24/2019 0231   MCHC 32.9 07/24/2019 0231   RDW 15.5 07/24/2019 0231   RDW 15.1 05/30/2017 1411   LYMPHSABS 0.4 (L) 07/23/2018 2045   MONOABS 0.7 07/23/2018 2045   EOSABS 0.0 07/23/2018 2045   BASOSABS 0.0 07/23/2018 2045    BMET    Component Value Date/Time   NA 138 07/24/2019 0231   NA 134 12/07/2018 1200   K 4.3 07/24/2019 0231   CL 104 07/24/2019 0231   CO2 22 07/24/2019 0231   GLUCOSE 87 07/24/2019 0231   BUN 23 07/24/2019 0231   BUN 24 12/07/2018 1200   CREATININE 1.58 (H) 07/24/2019 0231   CREATININE 1.12 10/14/2015 1117   CALCIUM 8.6 (L) 07/24/2019 0231   GFRNONAA 42 (L) 07/24/2019 0231   GFRAA 49 (L) 07/24/2019 0231    INR    Component Value Date/Time   INR 1.1 07/11/2019 1112     Intake/Output Summary (Last 24 hours) at 07/26/2019 0718 Last data filed at 07/26/2019 0424 Gross per 24 hour  Intake --  Output 1050 ml  Net -1050 ml     Assessment/Plan:  74 y.o. male is s/p L to R femfem with subsequent drainage of L groin lymphocele 3 Days Post-Op   BLE warm and well perfused Dr. Oneida Alar to evaluate L groin this morning D/c home if no further active drainage;  Will  need home vac if drainage persists Follow up appt on 6/3 with Dr. Terri Skains, PA-C [facility] [phone] 07/26/2019 7:18 AM  Agree with above.  Provena removed no expressible drainage Pt states he has to strain a lot for BM will need to add milk of magnesia to colace D/c home today on Keflex Follow up 2 weeks  Ruta Hinds, MD [facility] [place] Office: [phone]

## 2019-07-26 NOTE — Discharge Instructions (Signed)
Cleanse groin incisions with soap and water daily.  Pat to dry. Keep dry 4x4 gauze over left groin incision.  (minimize tape use as this will irritate the skin)  Change this at least twice daily. You will be prescribed 2 weeks of an antibiotic (Keflex) You will follow up with Dr. Oneida Alar on 08/08/19 at 3pm

## 2019-07-26 NOTE — Progress Notes (Signed)
   07/25/19 2042  Assess: MEWS Score  Temp 98.7 F (37.1 C)  BP (!) 95/59  Pulse Rate 83  Resp 16  SpO2 98 %  O2 Device Room Air  Assess: MEWS Score  MEWS Temp 0  MEWS Systolic 1  MEWS Pulse 0  MEWS RR 1  MEWS LOC 0  MEWS Score 2  MEWS Score Color Yellow  Assess: if the MEWS score is Yellow or Red  Were vital signs taken at a resting state? Yes  Focused Assessment Documented focused assessment  Early Detection of Sepsis Score *See Row Information* Low  MEWS guidelines implemented *See Row Information* No, vital signs rechecked  Treat  MEWS Interventions Other (Comment) (assessment)  Take Vital Signs  Increase Vital Sign Frequency  Yellow: Q 2hr X 2 then Q 4hr X 2, if remains yellow, continue Q 4hrs  Escalate  MEWS: Escalate Yellow: discuss with charge nurse/RN and consider discussing with provider and RRT  Notify: Charge Nurse/RN  Name of Charge Nurse/RN Notified [place]  Date Charge Nurse/RN Notified 07/25/19  Time Charge Nurse/RN Notified 2100  pt is stable, protocol  continued

## 2019-07-26 NOTE — Discharge Summary (Signed)
Discharge Summary  Patient ID: [name] [id number] 74 y.o. 07/13/1945  Admit date: 07/22/2019  Discharge date and time: 07/26/19   Admitting Physician: Elam Dutch, MD   Discharge Physician: Same  Admission Diagnoses: S/P femoral-femoral bypass surgery [Z95.828]  Discharge Diagnoses: Lymphocele left groin  Admission Condition: fair  Discharged Condition: fair  Indication for Admission: Lymphocele left groin  Hospital Course: [name] is a 74 year old male who underwent left to right femoral to femoral bypass by Dr. Oneida Alar on 07/15/2019.  He was readmitted to the hospital on 07/22/2019 due to large fluid accumulation in left groin and partially along the tract of the graft.  He was taken to the operating room by Dr. Scot Dock on 07/23/2019 and underwent evacuation of lymphocele of left groin and placement of Prevena negative pressure VAC dressing.  He tolerated the procedure well and was admitted to the hospital postoperatively.  Throughout his hospital stay he maintained a easily palpable bypass graft pulse.  Prevena VAC dressing was removed on postoperative day #3 and no further active drainage was noted from left groin.  He will be prescribed a 14-day regimen of Keflex.  He will resume all home medications.  He will also be prescribed 2 to 3 days of narcotic pain medication for continued postoperative pain control.  He will follow-up with Dr. Oneida Alar in 2 weeks.  He will be discharged home this morning in stable condition.  Consults: None  Treatments: surgery: Evacuation of lymphocele of left groin and placement of Prevena negative pressure dressing  Discharge Exam: See progress note see progress note 07/26/2019 Vitals:   07/26/19 0426 07/26/19 0746  BP: 122/75 (!) 116/57  Pulse: 80 67  Resp: 16 17  Temp: 99.1 F (37.3 C) 97.8 F (36.6 C)  SpO2: 96% 94%     Disposition: Discharge disposition: 01-Home or Self Care       Patient Instructions:    Allergies as of 07/26/2019      Reactions   Codeine Nausea And Vomiting   Sulfa Antibiotics Rash   Sulfonamide Derivatives Rash   Oxycodone Nausea Only      Medication List    STOP taking these medications   oxyCODONE 5 MG immediate release tablet Commonly known as: Oxy IR/ROXICODONE     TAKE these medications   ALAWAY OP Place 1 drop into the right eye daily. Uses before putting contact Lens in R eye   aspirin EC 81 MG tablet Take 1 tablet (81 mg total) by mouth daily.   butalbital-aspirin-caffeine 50-325-40 MG capsule Commonly known as: FIORINAL Take 1 capsule by mouth daily as needed for headache.   calcium carbonate 500 MG chewable tablet Commonly known as: TUMS - dosed in mg elemental calcium Chew 1 tablet (200 mg of elemental calcium total) by mouth 3 (three) times daily as needed for indigestion or heartburn.   cephALEXin 500 MG capsule Commonly known as: KEFLEX Take 1 capsule (500 mg total) by mouth 2 (two) times daily for 14 days.   diltiazem 120 MG 24 hr capsule Commonly known as: CARDIZEM CD TAKE 1 CAPSULE BY MOUTH EVERY DAY What changed: how much to take   dutasteride 0.5 MG capsule Commonly known as: AVODART Take 0.5 mg by mouth daily.   esomeprazole 40 MG capsule Commonly known as: NexIUM Take 1 capsule (40 mg total) by mouth 2 (two) times daily before a meal.   ezetimibe 10 MG tablet Commonly known as: ZETIA TAKE 1 TABLET (10 MG TOTAL) BY MOUTH  DAILY. What changed:   how much to take  how to take this  when to take this  additional instructions   Horizant 600 MG Tbcr Generic drug: Gabapentin Enacarbil Take 600 mg by mouth daily as needed (restless leg). (1700)   HYDROcodone-acetaminophen 5-325 MG tablet Commonly known as: Norco Take 1 tablet by mouth every 6 (six) hours as needed for moderate pain.   loratadine 10 MG tablet Commonly known as: CLARITIN Take 10 mg by mouth daily as needed for allergies, rhinitis or itching.    lovastatin 40 MG tablet Commonly known as: MEVACOR TAKE 1 TABLET BY MOUTH AT BEDTIME What changed: how much to take   metolazone 2.5 MG tablet Commonly known as: ZAROXOLYN Take 2.5 mg by mouth daily as needed (fluid retention).   multivitamins ther. w/minerals Tabs tablet Take 1 tablet by mouth every evening.   nitroGLYCERIN 0.4 MG/SPRAY spray Commonly known as: NITROLINGUAL Place 1 spray under the tongue every 5 (five) minutes x 3 doses as needed for chest pain.   Potassium Chloride ER 20 MEQ Tbcr Take 20 mEq by mouth every evening.   ranolazine 500 MG 12 hr tablet Commonly known as: RANEXA Take 500 mg by mouth 2 (two) times daily.   senna-docusate 8.6-50 MG tablet Commonly known as: Senokot-S Take 1 tablet by mouth daily. What changed: when to take this   sucralfate 1 GM/10ML suspension Commonly known as: CARAFATE Take 10 mLs (1 g total) by mouth 4 (four) times daily -  with meals and at bedtime. What changed: when to take this   torsemide 20 MG tablet Commonly known as: DEMADEX Take 40 mg by mouth 2 (two) times daily. Take 40 mg by mouth 2 times a day. For swelling or weight gain   vitamin E 200 UNIT capsule Take 200 Units by mouth every evening.      Activity: activity as tolerated Diet: regular diet Wound Care: Cleanse groin incisions with soap and water daily; pat to dry; dress left groin incision with dry gauze and change at least twice daily  Follow-up with Dr. Oneida Alar in 2 weeks.  Signed: Dagoberto Ligas, PA-C 07/26/2019 10:12 AM [facility] Office: [phone]

## 2019-07-28 LAB — AEROBIC/ANAEROBIC CULTURE W GRAM STAIN (SURGICAL/DEEP WOUND): Culture: NO GROWTH

## 2019-07-30 DIAGNOSIS — I5020 Unspecified systolic (congestive) heart failure: Secondary | ICD-10-CM | POA: Diagnosis not present

## 2019-08-06 ENCOUNTER — Other Ambulatory Visit: Payer: Self-pay | Admitting: *Deleted

## 2019-08-06 DIAGNOSIS — I739 Peripheral vascular disease, unspecified: Secondary | ICD-10-CM | POA: Diagnosis not present

## 2019-08-06 DIAGNOSIS — L97512 Non-pressure chronic ulcer of other part of right foot with fat layer exposed: Secondary | ICD-10-CM | POA: Diagnosis not present

## 2019-08-06 DIAGNOSIS — Z95828 Presence of other vascular implants and grafts: Secondary | ICD-10-CM

## 2019-08-07 DIAGNOSIS — N189 Chronic kidney disease, unspecified: Secondary | ICD-10-CM | POA: Diagnosis not present

## 2019-08-07 DIAGNOSIS — D631 Anemia in chronic kidney disease: Secondary | ICD-10-CM | POA: Diagnosis not present

## 2019-08-07 DIAGNOSIS — D509 Iron deficiency anemia, unspecified: Secondary | ICD-10-CM | POA: Diagnosis not present

## 2019-08-08 ENCOUNTER — Other Ambulatory Visit: Payer: Self-pay

## 2019-08-08 ENCOUNTER — Ambulatory Visit (HOSPITAL_COMMUNITY)
Admission: RE | Admit: 2019-08-08 | Discharge: 2019-08-08 | Disposition: A | Discharge status: Home / Self Care | Payer: Medicare PPO | Source: Ambulatory Visit | Attending: Vascular Surgery | Admitting: Vascular Surgery

## 2019-08-08 ENCOUNTER — Encounter: Payer: Self-pay | Admitting: Vascular Surgery

## 2019-08-08 ENCOUNTER — Ambulatory Visit: Payer: Self-pay | Admitting: Vascular Surgery

## 2019-08-08 VITALS — BP 95/53 | HR 75 | Temp 97.2°F | Resp 20 | Ht 65.0 in | Wt 123.0 lb

## 2019-08-08 DIAGNOSIS — I739 Peripheral vascular disease, unspecified: Secondary | ICD-10-CM

## 2019-08-08 DIAGNOSIS — Z95828 Presence of other vascular implants and grafts: Secondary | ICD-10-CM | POA: Diagnosis not present

## 2019-08-08 NOTE — Progress Notes (Signed)
Patient is a 74 year old male who returns for follow-up today.  He underwent left to right femoral-femoral bypass and right common femoral artery with profundoplasty on Jul 15, 2019.  He developed a postoperative seroma in the left groin and underwent I&D at this point partner Dr. Scot Dock on Jul 23, 2019.  Physical exam:  Vitals:   08/08/19 1538  BP: (!) 95/53  Pulse: 75  Resp: 20  Temp: (!) 97.2 F (36.2 C)  SpO2: 98%  Weight: 123 lb (55.8 kg)  Height: 5\' 5"  (1.651 m)    Extremities: Left groin wound is now healing well with no residual seroma no erythema no drainage staples removed today  Right groin incision is well-healed  Right foot has dry eschar on the dorsum of toes 1 and 2 the right third toenail has sloughed and there is good healthy skin below this he still has a 2 mm ulcer over the right fifth metatarsal head and a 3 mm ulcer over the posterior aspect of his right heel these both appear to be healing.  Data: Patient had bilateral ABIs performed today which were 0.56 on the left 0.4 on the right these were both improved from preoperatively.  Right toe pressure was 26 left 36  Assessment: Patent femoral-femoral bypass improved wounds right foot now with absent wrist pain  Plan: Patient will follow up in 3 months time for repeat bilateral ABIs.  He will follow-up sooner if the wounds deteriorate if he develops recurrent symptoms.  Ruta Hinds, MD [facility] Office: [phone]

## 2019-08-13 ENCOUNTER — Other Ambulatory Visit: Payer: Self-pay | Admitting: *Deleted

## 2019-08-13 DIAGNOSIS — I739 Peripheral vascular disease, unspecified: Secondary | ICD-10-CM

## 2019-08-20 LAB — FUNGAL ORGANISM REFLEX

## 2019-08-20 LAB — FUNGUS CULTURE WITH STAIN

## 2019-08-20 LAB — FUNGUS CULTURE RESULT

## 2019-08-27 DIAGNOSIS — I739 Peripheral vascular disease, unspecified: Secondary | ICD-10-CM | POA: Diagnosis not present

## 2019-08-27 DIAGNOSIS — L97512 Non-pressure chronic ulcer of other part of right foot with fat layer exposed: Secondary | ICD-10-CM | POA: Diagnosis not present

## 2019-08-28 DIAGNOSIS — N1831 Chronic kidney disease, stage 3a: Secondary | ICD-10-CM | POA: Diagnosis not present

## 2019-08-28 DIAGNOSIS — E782 Mixed hyperlipidemia: Secondary | ICD-10-CM | POA: Diagnosis not present

## 2019-08-28 DIAGNOSIS — D500 Iron deficiency anemia secondary to blood loss (chronic): Secondary | ICD-10-CM | POA: Diagnosis not present

## 2019-08-28 DIAGNOSIS — I480 Paroxysmal atrial fibrillation: Secondary | ICD-10-CM | POA: Diagnosis not present

## 2019-08-28 DIAGNOSIS — I25700 Atherosclerosis of coronary artery bypass graft(s), unspecified, with unstable angina pectoris: Secondary | ICD-10-CM | POA: Diagnosis not present

## 2019-08-28 DIAGNOSIS — I739 Peripheral vascular disease, unspecified: Secondary | ICD-10-CM | POA: Diagnosis not present

## 2019-08-30 DIAGNOSIS — I5020 Unspecified systolic (congestive) heart failure: Secondary | ICD-10-CM | POA: Diagnosis not present

## 2019-09-16 ENCOUNTER — Ambulatory Visit: Payer: Medicare PPO | Admitting: Cardiovascular Disease

## 2019-09-16 ENCOUNTER — Encounter: Payer: Self-pay | Admitting: Cardiovascular Disease

## 2019-09-16 ENCOUNTER — Other Ambulatory Visit: Payer: Self-pay

## 2019-09-16 DIAGNOSIS — E785 Hyperlipidemia, unspecified: Secondary | ICD-10-CM

## 2019-09-16 DIAGNOSIS — Z95828 Presence of other vascular implants and grafts: Secondary | ICD-10-CM

## 2019-09-16 DIAGNOSIS — I4821 Permanent atrial fibrillation: Secondary | ICD-10-CM | POA: Diagnosis not present

## 2019-09-16 DIAGNOSIS — Z951 Presence of aortocoronary bypass graft: Secondary | ICD-10-CM

## 2019-09-16 DIAGNOSIS — I4510 Unspecified right bundle-branch block: Secondary | ICD-10-CM | POA: Diagnosis not present

## 2019-09-16 DIAGNOSIS — I739 Peripheral vascular disease, unspecified: Secondary | ICD-10-CM

## 2019-09-16 DIAGNOSIS — I6523 Occlusion and stenosis of bilateral carotid arteries: Secondary | ICD-10-CM

## 2019-09-16 DIAGNOSIS — Z7901 Long term (current) use of anticoagulants: Secondary | ICD-10-CM | POA: Diagnosis not present

## 2019-09-16 DIAGNOSIS — I350 Nonrheumatic aortic (valve) stenosis: Secondary | ICD-10-CM

## 2019-09-16 DIAGNOSIS — I2510 Atherosclerotic heart disease of native coronary artery without angina pectoris: Secondary | ICD-10-CM

## 2019-09-16 MED ORDER — NITROGLYCERIN 0.4 MG/SPRAY TL SOLN: 1.0000 | 2 refills | Status: DC | PRN

## 2019-09-16 MED ORDER — FUROSEMIDE 40 MG PO TABS: ORAL_TABLET | ORAL | 3 refills | Status: DC

## 2019-09-16 NOTE — Patient Instructions (Signed)
Medication Instructions:  INCREASE YOUR LASIX TO 60MG  IN THE MORNING-- IF YOU ARE STILL HAVING SWELLING, INCREASE TO 80MG  IN THE MORNING AND AN AS NEEDED 40MG  IN THE AFTERNOON  *If you need a refill on your cardiac medications before your next appointment, please call your pharmacy*    Follow-Up: At [facility], you and your health needs are our priority.  As part of our continuing mission to provide you with exceptional heart care, we have created designated Provider Care Teams.  These Care Teams include your primary Cardiologist (physician) and Advanced Practice Providers (APPs -  Physician Assistants and Nurse Practitioners) who all work together to provide you with the care you need, when you need it.  We recommend signing up for the patient portal called "MyChart".  Sign up information is provided on this After Visit Summary.  MyChart is used to connect with patients for Virtual Visits (Telemedicine).  Patients are able to view lab/test results, encounter notes, upcoming appointments, etc.  Non-urgent messages can be sent to your provider as well.   To learn more about what you can do with MyChart, go to [email or web address].    Your next appointment:   4 month(s)  The format for your next appointment:   In Person  Provider:   Shelva Majestic, MD

## 2019-09-16 NOTE — Progress Notes (Signed)
Patient ID: [name], male   DOB: 03-Dec-1945, 74 y.o.   MRN: [id number]    Primary MD:  Dr. Garwin Brothers  HPI: [name] is a 74 y.o. male who is a former patient of Dr. Rollene Fare.  He presents for a 7 month follow-up cardiology evaluation.  [name] has an extensive cardiac and peripheral vascular history. He underwent initial CABG revascularization surgery in [place] in 1984. In 1996 he required re-do CABG revascularization surgery after all grafts were found to be occluded. At that time he also underwent right carotid endarterectomy. In 1998 he underwent stenting of his left main coronary artery and also had stenting done to the vein graft supplying the marginal vessel. He has history of right renal artery stenosis and underwent initial stenting with predilatation 2005. He has documented chronic right bundle branch block. He also significant peripheral vascular disease with bilateral SFA occlusion. He has known left subclavian artery occlusion with right subclavian stenosis. Has a history of hypertension, hyperlipidemia, and remotely smoked cigarettes. His last catheterization was done in November 2012 which showed significant native CAD with evidence for patent left main stent. He had an occluded circumflex marginal vessel from the AV groove circumflex. There was 80% ostial RCA stenosis with 90% stenosis in the mid right coronary artery proximal to the RV marginal branch and total occlusion of the mid RCA. An occluded vein graft supplying the right coronary artery but evidence for collateralization to the distal right artery the left coronary injection and injection of the grafts. The vein supplying the marginal vessel with the previously placed ostial proximal Taxus stent had in-stent narrowing of 50% followed by 95% stenosis in the body of the proximal third of the vessel graft prior to the previously placed mid grafts 3.0x20 mm Taxus stent. At that time, he underwent  difficult but successful intervention involving long segmental stenosis of the vein graft supplying the circumflex marginal vessel. Additional tandem 3.0x38 mm and 3.0x8 mm Taxus stents were inserted with excellent angiographic result.   He saw Dr. Rollene Fare in August 2014 and due to concern for progressive carotid stenoses repeat Doppler studies were done and he also saw Dr. Sherren Mocha Early for followup evaluation. Repeat imaging showed a patent right carotid endarterectomy site the Doppler velocity suggestive of 40  percent stenosis of the bilateral proximal internal carotid arteries. There was left subclavian steal noted with retrograde left vertebral artery flow, monophasic left subclavian artery flow and significant difference in the bilateral brachial pressures.  He established cardiology care with me in December 2014.  A nuclear perfusion study in May 2015 demonstrated mild inferobasal ischemia, most likely secondary to RCA distribution.    On 03/25/2014 he was seen by Dr. Martinique after he developed an episode of atrial fibrillation the day before.  An ECG in Dr. Latina Craver  office showed atrial fibrillation with rate of 120.  His atenolol was increased to 50 mg twice a day, and he converted to sinus rhythm after 18 hours.  When he saw Dr. Martinique the following day he was in sinus rhythm.  On the increased dose of beta blocker he denies recurrent palpitations.  He tells me he recently underwent a colonoscopy and had 3 polyps removed, but also required cauterization for another site that was oozing blood.  This reason, he was not restarted on anticoagulation but has been maintained on low-dose aspirin and Plavix.  He has seen Dr. Gwenlyn Found in follow-up of his PVD and is status post  left renal stenting.  He also is followed by Dr. Donnetta Hutching for his carotid disease.  In August 2017 he was seen by Kerin Ransom after he was found to be in atrial fibrillation 1 week previously at cardiac rehabilitation.  He was started on  amiodarone 200 mg twice a day for 1 week and then 200 mg daily and his atenolol was reduced to 25 mg twice a day.  He was seen several weeks later on 10/29/2015 by Lesia Hausen and was back in sinus rhythm.  He underwent carotid duplex imaging on 12/23/2015.  The right carotid endarterectomy site had Doppler velocities suggestive of a 60-79% stenosis. The left internal carotid artery velocity suggested a 40-59% stenoses.  Renal duplex imaging to just a patent right renal artery stent.  There was stable greater than 60% left renal artery stenosis.    When I  saw him in November 2017 he was maintaining sinus rhythm and  since he was on amiodarone, I recommended he reduce lovastatin to 20 mg and added Zetia for continued aggressive lipid management.  He had progressive lower extremity edema and recommended he increase his torsemide.  In March 2018 he developed episodes of bronchitis.  Since that time his rhythm has been irregular.  He wore a Holter monitor which showed atrial fibrillation with a minimum rate at 52 at 4:02 AM was sleeping and maximum rate at 119.  In the afternoon with an average rate of 82 bpm.  There are frequent PVCs, rare couplets, occasional episodes of bigeminy and trigeminy but no episodes of VT.  There were no pauses.  Previously, his blood pressure had become low and hehad been on losartan and I saw him one month ago there was leg swelling, left greater than right.  He had a chest x-ray with Dr. Reesa Chew on 06/21/2016.  A  follow-up carotid Doppler study in April 2018 showed improvement in his velocities such that his right carotid endarterectomy was patent with velocities now suggestive of 40-59% stenosis.  The left internal carotid velocities suggested a 1-39% stenosis.   When I saw him in May 2018, he was in atrial fibrillation.  I discontinued Plavix and started him on eliquis 5 mg twice a day.  I recommended that he not to take aspirin with his GI history.  I increased amiodarone to  200 mg twice a day and reduced his Ranexa dose to 500 mg twice a day with concomitant therapy.  He was having lower extremity edema.  A Doppler study was negative for DVT by his primary physician.  I recommended support stockings.    I saw him 1 month later and he continued to be in atrial fibrillation.  At that time, we discussed potential DC cardioversion for restoration of sinus rhythm.  He underwent repeat echo on 08/24/2016 which showed an EF of 55-60%.  His left atrium was normal in size and his right atrium was mildly dilated.  PA pressure was increased at 48 mm.  There was moderate LVH. Marland Kitchen  He apparently was seen subsequent to that evaluation by Almyra Deforest and was having 2+ bilateral lower extremity edema.  Further discussion concerning cardioversion was undertaken but the patient initially deferred at that point and wanted to that it further.  He tells me 1 month ago he noticed some bright red blood per rectum.  As result, he self reduced his eliquis from 5 mg twice a day down to 5 mg daily.  Remotely, he has seen Dr. Scarlette Shorts in  the past.  His last colonoscopy was at least 5 years ago.  He had stopped taking amiodarone since he felt to contribute to fatigue, tremors, dizziness, and gait staggering.  The symptomshave improved off amiodarone antiarrhythmic therapy.  Her, he has noticed that at times his pulse can increase up to the low 100s with minimal activity.  He has now been using compression stockings for his ankle edema which has improved.    When I  saw him in November 2018 , I recommended eliquis 5 mg twice a day for full anticoagulation.  I started him on metoprolol 50 mg twice a day  I discussed options concerning continuing permanent AF versus possible Tikosyn or AF ablation and referred him for  EP evaluation.  He saw Dr. Curt Bears in January 2019. Due to his QTC prolongation  he was not felt to be a candidate for Tikosyn or sotalol a and also was felt to be high risk for ablation.  As result  he recommended reiitiation of low-dose amiodarone.n 04/12/2017.  He underwent successful cardioversion.  When he saw Kerin Ransom on 04/19/2017  He had complaints of increasing dyspnea on exertion and orthopnea, which were similar to his previous symptoms on amiodarone.  When he last saw Shanon Brow a on May 02, 2017 he also complained of a fine tremor since institution of amiodarone, and amiodarone  was reduced to 100 mg every other day.   He underwent carotid duplex imaging which showed mild bilateral carotid plaque 40-59% on the right and 1-39% in the left.  In addition, an abdominal renal duplex study showed patent right renal artery stent, moderate left renal artery stenosis, and significant SMA stenosis.  This was reviewed by Dr. Gwenlyn Found.  When seen in March 2019 he felt his tremor and loss of coordination on the higher dose of amiodarone  improved with the lower dose amiodarone.  He was not taking Demadex for the past 10 days and this has resulted in increasing leg swelling.  Subsequently, he was seen in the atrial fibrillation clinic by Orson Eva several occasions, last seen Jul 14, 2017.  Prior to that evaluation he had seen his PCP for intermittent fevers.  When last seen in A. fib clinic he was in sinus rhythm. He denies any recurrent symptoms.  He continues to be on amiodarone 100 mg every other day and Eliquis for anticoagulation.  He is on metoprolol 75 mg twice a day, Ranexa 500 mg twice a day and his dose was reduced when amiodarone was instituted.  He is on lovastatin 40 mg for hyperlipidemia.  He has been taking torsemide on an as-needed basis.  He has a prescription for diltiazem 30 mg to take if his heart rate increases above 100 and long as blood pressure is greater than 100.    When I saw him in September 2019 he was back in atrial fibrillation with ventricular rate at 76 bpm.  His right bundle branch block was stable and there was evidence for left posterior hemiblock.  Had only been  taking amiodarone 100 mg every other day and I suggested that he further titrate his back to 100 mg daily.  With this slightly higher dose he has noticed some slight increase in some balance and coordination issues.  He denies any anginal symptoms.  He has noted some leg swelling bilaterally.   Since I last saw him in November 2019, he has seen Dr. Curt Bears in January 2020.  He was not atrial fibrillation and has  not maintained sinus rhythm after his cardioversion.  At that time, he recommended discontinuance of amiodarone and that he continue in permanent atrial fibrillation.  At the end of February 2020 he developed increasing chest congestion, went to urgent care and was treated with antibiotics for 10 days.  Reportedly a chest x-ray was negative.  He has continued to have chest congestion.    He fractured his right hip in April and spent 4 days at [facility].  In May he underwent PV intervention by Dr. Fabienne Bruns with stenting of his celiac artery and superior mesenteric artery due to high-grade stenoses and evidence for chronic mesenteric ischemia.  He developed a pseudoaneurysm which was successfully compressed on Jul 27, 2018.  He saw Dr. Allyson Sabal in June 2020 for progressive lower extremity edema who doubled his torsemide to twice a day.  Subsequently he apparently was given a prescription for metolazone by Dr. Sharol Roussel to take on Monday Wednesday and Friday.    I saw him in June 2020 at which time his blood pressure was stable but he had been experiencing progressive lower extremity edema necessitating doubling of his torsemide dose to 20 twice a day and initiation of metolazone by Dr. Nelson Chimes.  He was not having any anginal symptoms.  He was last evaluated by Dr. Allyson Sabal  in a telemedicine visit for his peripheral vascular disease including his renal artery stenosis, and known bilateral SFA occlusions.  He was not having any lifestyle limiting claudication.  Blood pressure and renal function had  remained stable.  He had moderate right and mild to moderate left internal carotid stenoses with plan noninvasive annual monitoring.  I last saw him a telemedicine visit in December 2020.  He was without chest pain.  His atrial fibrillation rate has been controlled.  His leg swelling has improved and is better but at times there is still some mild lower extremity swelling.  He has been taking torsemide 2 tablets twice a day.  He has not required use of metolazone in over 3 weeks.  He will be undergoing laboratory at the end of December at the cancer center who has been following his hemoglobin closely.  He had required a transfusion at [facility] in late summer.    Apparently, over the past 14 months he has been hospitalized on 7 occasions.  He underwent left to right femorofemoral bypass surgery by Dr. Darrick Penna on Jul 15, 1999.  He was readmitted to the hospital on Jul 22, 2019 due to a large fluid accumulation in the left groin along the tract of the graft.  He underwent evacuation of a lymphocele left main and placement of a negative pressure VAC dressing by Dr. Durwin Nora on Jul 23, 2019.  Following revascularization, his chronic right leg pain had improved.  He was taken off Eliquis due to low hemoglobin.  He also underwent iron infusion therapy.  He has continued to experience 1-2+ pretibial edema bilaterally.  Apparently he had been on torsemide 40 mg twice a day but this was switched by Dr. Nelson Chimes to just Lasix 40 mg daily.  He has chronic kidney disease with creatinine 1.58 May 2021.  Since this time his leg swelling has significantly increased.  He presents for evaluation.  Past Medical History:  Diagnosis Date  . Anemia   . Angina   . Aortic stenosis   . Atrial fibrillation (HCC)   . Blood transfusion   . Bronchitis   . Bulging discs    "8 of them;  thoracic, lumbar, sacral area"  . CHF (congestive heart failure) ([place])   . Chronic back pain   . Chronic kidney disease    "mild kidney  disease"  . Colon polyps   . Coronary artery disease 01/24/11   Successful PCI long segmental stensois  vein graft to the CX marginal vessel-graft previously stented prox. 3.0x51m TAXUS stent mid seg 3.0x244mTAXUS stent now tandem stens of 3.0x3858m 3.0x8mm67maced with the seg. 50,80 & 99% stenosis reduced to 0%  . DDD (degenerative disc disease)   . Diastolic heart failure (HCC)[place]. Dysrhythmia    "PAC's and PVC's"  . Esophageal dilatation    "have had it done 7 times; last time ~ 2001"  . Esophageal stricture   . GERD (gastroesophageal reflux disease)   . Headache(784.0)   . Heart murmur   . Hiatal hernia   . Hyperlipidemia   . Hypertension   . Myocardial infarction (HCC)[place]91  . [facility]   "had 3 heart attacks this year"  . Paroxysmal atrial fibrillation (HCC)   . Pneumonia 2020  . Pyloric stenosis   . RBBB   . Renal artery stenosis (HCC)    hhistory of right renal artery stenting and re-intervention for "in-stent restenosis.  . S/P CABG (coronary artery bypass graft) [place]    "when I had the heart attacks"  . Subclavian artery stenosis, left (HCC)[place]. Ulcer    "small; Dr. PerrHenrene Pastornd it 02/2010"    Past Surgical History:  Procedure Laterality Date  . ABDOMINAL AORTOGRAM W/LOWER EXTREMITY N/A 05/10/2019   Procedure: ABDOMINAL AORTOGRAM W/LOWER EXTREMITY;  Surgeon: FielElam Dutch;  [facility];  Service: Cardiovascular;  Laterality: N/A;  . ADENOIDECTOMY     "when I was an infant"  . APPENDECTOMY  1953  . APPLICATION OF WOUND VAC Left 07/23/2019   Procedure: Application Of Wound Vac;  Surgeon: DickAngelia Mould;  [facility];  Laterality: Left;  . BALLOON DILATION N/A 10/14/2018   Procedure: BALLOON DILATION;  Surgeon: CiriLavena Bullion;  [facility]: Gastroenterology;  Laterality: N/A;  . BIOPSY   07/26/2018   Procedure: BIOPSY;  Surgeon: BeavThornton Park;  [facility] [place];;  . BIOPSY  10/14/2018   Procedure: BIOPSY;  Surgeon: CiriLavena Bullion;  Location[facility]: Gastroenterology;;  . CARDIOVERSION N/A 04/17/2017   Procedure: CARDIOVERSION;  Surgeon: SkaiJerline Pain;  [facility];  Service: Cardiovascular;  Laterality: N/A;  . CAROTID ENDARTERECTOMY Right 1996   CE  . COLONOSCOPY WITH PROPOFOL N/A 10/14/2018   Procedure: COLONOSCOPY WITH PROPOFOL;  Surgeon: CiriLavena Bullion;  [facility];  Laterality: N/A;  . CORONARY ANGIOPLASTY WITH STENT PLACEMENT  01/24/11   "today makes a total of 9 stents"  . CORONARY ARTERY BYPASS GRAFT  1984   CABG X 4  . CORONARY ARTERY BYPASS GRAFT  1996   CABG X 4  . ENDARTERECTOMY FEMORAL Right 07/15/2019   Procedure: RIGHT FEMORAL ENDARTARECTOMY WITH PROFUNDOPLASTY;  Surgeon: FielElam Dutch;  [facility] V[facility];  Laterality: Right;  . ESOPHAGOGASTRODUODENOSCOPY (EGD) WITH PROPOFOL N/A 07/26/2018   Procedure: ESOPHAGOGASTRODUODENOSCOPY (EGD) WITH PROPOFOL;  Surgeon: BeavThornton Park;  [facility];  Laterality: N/A;  .  ESOPHAGOGASTRODUODENOSCOPY (EGD) WITH PROPOFOL N/A 10/14/2018   Procedure: ESOPHAGOGASTRODUODENOSCOPY (EGD) WITH PROPOFOL;  Surgeon: Lavena Bullion, DO;  Location: [place];  Service: Gastroenterology;  Laterality: N/A;  . EYE SURGERY  2009   detached retina in right eye  . FEMORAL-FEMORAL BYPASS GRAFT Bilateral 07/15/2019   Procedure: LEFT TO RIGHT FEMORAL-FEMORAL ARTERY;  Surgeon: Elam Dutch, MD;  Location: [place];  Service: Vascular;  Laterality: Bilateral;  . FRACTURE SURGERY  07/03/2018   fractured right hip sx.  . IR THORACENTESIS ASP PLEURAL SPACE W/IMG GUIDE  10/12/2018  . LEFT HEART CATHETERIZATION WITH CORONARY/GRAFT ANGIOGRAM N/A 01/24/2011     Procedure: LEFT HEART CATHETERIZATION WITH Beatrix Fetters;  Surgeon: Troy Sine, MD;  Location: [facility] CATH LAB;  Service: Cardiovascular;  Laterality: N/A;  . PERIPHERAL VASCULAR INTERVENTION  07/16/2018   Procedure: PERIPHERAL VASCULAR INTERVENTION;  Surgeon: Elam Dutch, MD;  Location: [place] CV LAB;  Service: Cardiovascular;;  SMA and Celiac  . pylondial cyst removal    . RETINAL DETACHMENT SURGERY  04/2007   right  . TONSILLECTOMY  1972  . VISCERAL ANGIOGRAPHY N/A 07/16/2018   Procedure: VISCERAL ANGIOGRAPHY;  Surgeon: Elam Dutch, MD;  Location: [place] CV LAB;  Service: Cardiovascular;  Laterality: N/A;  . WOUND DEBRIDEMENT Left 07/23/2019   Procedure: INCISION AND DRAINAGE LEFT GROIN WOUND;  Surgeon: Angelia Mould, MD;  Location: [place];  Service: Vascular;  Laterality: Left;    Allergies  Allergen Reactions  . Codeine Nausea And Vomiting  . Sulfa Antibiotics Rash  . Sulfonamide Derivatives Rash  . Oxycodone Nausea Only    Current Outpatient Medications  Medication Sig Dispense Refill  . aspirin EC 81 MG tablet Take 1 tablet (81 mg total) by mouth daily. 150 tablet 2  . butalbital-aspirin-caffeine (FIORINAL) 50-325-40 MG capsule Take 1 capsule by mouth daily as needed for headache.     . calcium carbonate (TUMS - DOSED IN MG ELEMENTAL CALCIUM) 500 MG chewable tablet Chew 1 tablet (200 mg of elemental calcium total) by mouth 3 (three) times daily as needed for indigestion or heartburn. 30 tablet 0  . diltiazem (CARDIZEM CD) 120 MG 24 hr capsule TAKE 1 CAPSULE BY MOUTH EVERY DAY (Patient taking differently: Take 120 mg by mouth daily. ) 90 capsule 1  . dutasteride (AVODART) 0.5 MG capsule Take 0.5 mg by mouth daily.     Marland Kitchen esomeprazole (NEXIUM) 40 MG capsule Take 1 capsule (40 mg total) by mouth 2 (two) times daily before a meal. 120 capsule 3  . furosemide (LASIX) 40 MG tablet TAKE '60MG'$  (1.5 TAB) MG IN THE MORNING- IF STILL HAVING SWELLING CAN  INCREASE TO '80MG'$  (2 TAB) IN THE MORNING AND AN AS NEEDED '40MG'$  (1 TAB) TAB IN THE AFTERNOON 180 tablet 3  . Gabapentin Enacarbil (HORIZANT) 600 MG TBCR Take 600 mg by mouth daily as needed (restless leg). (1700)    . HYDROcodone-acetaminophen (NORCO) 5-325 MG tablet Take 1 tablet by mouth every 6 (six) hours as needed for moderate pain. 15 tablet 0  . Ketotifen Fumarate (ALAWAY OP) Place 1 drop into the right eye daily. Uses before putting contact Lens in R eye    . loratadine (CLARITIN) 10 MG tablet Take 10 mg by mouth daily as needed for allergies, rhinitis or itching.   3  . lovastatin (MEVACOR) 40 MG tablet TAKE 1 TABLET BY MOUTH AT BEDTIME (Patient taking differently: Take 20 mg by mouth at bedtime. ) 90 tablet 3  .  metolazone (ZAROXOLYN) 2.5 MG tablet Take 2.5 mg by mouth daily as needed (fluid retention).     . Multiple Vitamins-Minerals (MULTIVITAMINS THER. W/MINERALS) TABS Take 1 tablet by mouth every evening.     . nitroGLYCERIN (NITROLINGUAL) 0.4 MG/SPRAY spray Place 1 spray under the tongue every 5 (five) minutes x 3 doses as needed for chest pain. 4.9 g 2  . Potassium Chloride ER 20 MEQ TBCR Take 20 mEq by mouth every evening.     . ranolazine (RANEXA) 500 MG 12 hr tablet Take 500 mg by mouth 2 (two) times daily.    Marland Kitchen senna-docusate (SENOKOT-S) 8.6-50 MG tablet Take 1 tablet by mouth daily. (Patient taking differently: Take 1 tablet by mouth at bedtime. ) 30 tablet 6  . sucralfate (CARAFATE) 1 GM/10ML suspension Take 10 mLs (1 g total) by mouth 4 (four) times daily -  with meals and at bedtime. (Patient taking differently: Take 1 g by mouth in the morning, at noon, in the evening, and at bedtime. ) 1200 mL 11  . vitamin E 200 UNIT capsule Take 200 Units by mouth every evening.     . ezetimibe (ZETIA) 10 MG tablet TAKE 1 TABLET BY MOUTH EVERY DAY 90 tablet 2   No current facility-administered medications for this visit.    Social History   Socioeconomic History  . Marital status:  Married    Spouse name: Not on file  . Number of children: Not on file  . Years of education: Not on file  . Highest education level: Not on file  Occupational History  . Not on file  Tobacco Use  . Smoking status: Former Smoker    Packs/day: 1.00    Years: 38.00    Pack years: 38.00    Types: Cigarettes    Quit date: 02/09/2005    Years since quitting: 14.6  . Smokeless tobacco: Never Used  . Tobacco comment: "quit smoking cigarrettes 2006"  Vaping Use  . Vaping Use: Never used  Substance and Sexual Activity  . Alcohol use: No    Alcohol/week: 0.0 standard drinks  . Drug use: No  . Sexual activity: Not on file  Other Topics Concern  . Not on file  Social History Narrative  . Not on file   Social Determinants of Health   Financial Resource Strain:   . Difficulty of Paying Living Expenses:   Food Insecurity:   . Worried About Charity fundraiser in the Last Year:   . Arboriculturist in the Last Year:   Transportation Needs:   . Film/video editor (Medical):   Marland Kitchen Lack of Transportation (Non-Medical):   Physical Activity:   . Days of Exercise per Week:   . Minutes of Exercise per Session:   Stress:   . Feeling of Stress :   Social Connections:   . Frequency of Communication with Friends and Family:   . Frequency of Social Gatherings with Friends and Family:   . Attends Religious Services:   . Active Member of Clubs or Organizations:   . Attends Archivist Meetings:   Marland Kitchen Marital Status:   Intimate Partner Violence:   . Fear of Current or Ex-Partner:   . Emotionally Abused:   Marland Kitchen Physically Abused:   . Sexually Abused:    Socially he is married has 2 children 2 stepchildren. He quit tobacco in 2000.  Family History  Problem Relation Age of Onset  . Breast cancer Paternal Aunt   .  Diabetes Sister   . Ulcers Father   . Heart disease Father   . Heart failure Father   . Heart attack Father   . AAA (abdominal aortic aneurysm) Father   . Stroke Mother    . Hypertension Mother   . 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 Henrene Pastor; mesenteric ischemia status post stenting of the celiac and superior mesenteric arteries. GU: Occasional concentrated or  dark urine without blood Musculoskeletal: Negative; no myalgias, joint pain, or weakness Hematologic/Oncology: Positive for anemia Endocrine: Negative; no heat/cold intolerance; no diabetes Neuro: Negative; no changes in balance, headaches Skin: Negative; No rashes or skin lesions Psychiatric: Negative; No behavioral problems, depression Sleep: Negative; No snoring, daytime sleepiness, hypersomnolence, bruxism, restless legs, hypnogognic hallucinations, no cataplexy Other comprehensive 14 point system review is negative.   PE BP 100/68   Pulse 76   Temp (!) 96.8 F (36 C)   Ht 5' 5"  (1.651 m)   Wt 130 lb (59 kg)   SpO2 98%   BMI 21.63 kg/m    Repeat blood pressure by me was 120/60  Wt Readings from Last 3 Encounters:  09/16/19 130 lb (59 kg)  08/08/19 123 lb (55.8 kg)  07/23/19 122 lb (55.3 kg)   General: Alert, oriented, no distress.  Skin: normal turgor, no rashes, warm and dry HEENT: Normocephalic, atraumatic. Pupils equal round and reactive to light; sclera anicteric; extraocular muscles intact; Nose without nasal septal hypertrophy Mouth/Parynx benign; Mallinpatti scale 3 Neck: No JVD, no carotid bruits; normal carotid upstroke Lungs: clear to ausculatation and percussion; no wheezing or rales Chest wall: without tenderness to palpitation Heart: PMI not displaced, RRR, s1 s2 normal, 1/6 systolic murmur, no diastolic murmur, no rubs, gallops, thrills, or  heaves Abdomen: soft, nontender; no hepatosplenomehaly, BS+; abdominal aorta nontender and not dilated by palpation. Back: no CVA tenderness Pulses 2+ Musculoskeletal: full range of motion, normal strength, no joint deformities Extremities: 1-2+ pretibial edema bilaterally edema, no clubbing cyanosis, Homan's sign negative  Neurologic: grossly nonfocal; Cranial nerves grossly wnl Psychologic: Normal mood and affect   ECG (independently read by me): Atrial fibrillation at 76 bpm with isolated PVC.  Right bundle branch block with repolarization changes.  Small nondiagnostic inferior Q waves in 3 and aVF  April 2020 ECG (independently read by me): Atrial fibrillation at 96 bpm, right bundle branch block with repolarization changes.  May 25, 2018 ECG (independently read by me): Probable atrial flutter with 3-1 block and a ventricular rate at 85.  Bundle branch block with repolarization changes.    January 25, 2018 ECG (independently read by me): Atrial fibrillation at 77 bpm.  Right bundle branch block with repolarization changes.  QTc interval 506 ms.  November 22, 2017 ECG (independently read by me): Atrial fibrillation at 76 bpm.  Right bundle branch block with repolarization changes.  Left posterior hemiblock.  Nonspecific ST-T changes  May 2019 ECG (independently read by me): Normal sinus rhythm at 63 bpm.  Right bundle branch block with repolarization changes.  Nonspecific ST changes.  QTc interval 493 ms.    March 2019 ECG (independently read by me): normal sinus rhythm at 64 bpm.  Right bundle branch block  with repolarization changes.  Left posterior hemiblock.  QTc interval 497 ms.    November 2018 ECG (independently read by me): Atrial flutter with variable block.  PVC.  Left posterior hemiblock.  Right bundle branch block.  June 2018 ECG (independently read by me): Atrial fibrillation with ventricular rate at 70 bpm.  Right bundle-branch block with repolarization changes.   Probable left posterior fascicular block.  07/21/2016 ECG (independently read by me): atrial fibrillation with a controlled ventricular rate at 71 bpm.  Right bundle branch block and left posterior hemiblock.  Small inferior Q waves.  November 2017 ECG (independently read by me): Sinus rhythm with a PVC.  Right bundle branch block and probable left posterior hemiblock.  Inferior Q waves with preserved R waves.  February 2017 ECG (independently read by me): normal sinus rhythm at 61 bpm. Probable left atrial enlargement. Right bundle branch block.  ECG (independently read by me): Sinus bradycardia 58 bpm.  Increased QTc interval 473 ms.  Right bundle-branch block.  May 2016 ECG (independently read by me): Sinus bradycardia 59 bpm with right bundle branch block.I ST-T changes.  QTc interval 475 ms.  PR interval 182 ms.  No ectopy.  February 2016 ECG (independently read by me) normal sinus rhythm at 63 bpm.  Right bundle-branch block.  Probable left posterior fascicular block.  PR interval 170 ms.  QTC interval 480 ms.  November 2015 ECG (independently read by me): Normal sinus rhythm at 62 bpm.  Right bundle branch block.  Small nondiagnostic inferior Q waves.  Prior May 2015ECG : sinus rhythm at 58 beats per minute.  Right bundle branch block.  Prior December 2014 ECG: Sinus rhythm with sinus bradycardia 55 beats per minute. Right bundle branch block.  LABS: BMP Latest Ref Rng & Units 07/24/2019 07/23/2019 07/22/2019  Glucose 70 - 99 mg/dL 87 112(H) 111(H)  BUN 8 - 23 mg/dL 23 24(H) 25(H)  Creatinine 0.61 - 1.24 mg/dL 1.58(H) 1.51(H) 1.45(H)  BUN/Creat Ratio 10 - 24 - - -  Sodium 135 - 145 mmol/L 138 138 139  Potassium 3.5 - 5.1 mmol/L 4.3 3.6 4.5  Chloride 98 - 111 mmol/L 104 103 101  CO2 22 - 32 mmol/L 22 25 28   Calcium 8.9 - 10.3 mg/dL 8.6(L) 8.5(L) 9.0   Hepatic Function Latest Ref Rng & Units 07/11/2019 12/07/2018 10/12/2018  Total Protein 6.5 - 8.1 g/dL 7.3 6.3 6.0(L)  Albumin 3.5 -  5.0 g/dL 3.6 3.5(L) -  AST 15 - 41 U/L 19 24 -  ALT 0 - 44 U/L 14 17 -  Alk Phosphatase 38 - 126 U/L 92 134(H) -  Total Bilirubin 0.3 - 1.2 mg/dL 0.8 0.6 -  Bilirubin, Direct 0.0 - 0.2 mg/dL - - -   CBC Latest Ref Rng & Units 07/24/2019 07/23/2019 07/22/2019  WBC 4.0 - 10.5 K/uL 6.2 6.1 7.1  Hemoglobin 13.0 - 17.0 g/dL 8.3(L) 8.3(L) 9.4(L)  Hematocrit 39 - 52 % 25.2(L) 24.9(L) 28.6(L)  Platelets 150 - 400 K/uL 151 148(L) 171   Lab Results  Component Value Date   MCV 99.6 07/24/2019   MCV 98.4 07/23/2019   MCV 100.0 07/22/2019   Lab Results  Component Value Date   TSH 0.857 07/24/2018    Lipid Panel     Component Value Date/Time   CHOL 143 05/25/2018 1027   TRIG 84 05/25/2018 1027   HDL 63 05/25/2018 1027   CHOLHDL 2.3 05/25/2018 1027   CHOLHDL 2.6 08/01/2014 0852   VLDL 18 08/01/2014  [place] 05/25/2018 1027     RADIOLOGY: No results found.  IMPRESSION:  1. CAD in native artery   2. Hx of CABG '84, '96, last PCI 2012   3. Permanent atrial fibrillation ([place])   4. Chronic anticoagulation   5. Hyperlipidemia LDL goal <70   6. Bilateral carotid artery stenosis   7. S/P femoral-femoral bypass surgery   8. PAD (peripheral artery disease) ([place])   9. RBBB     ASSESSMENT AND PLAN: Mr. Gino Garrabrant is a 74 year old gentleman who has an extensive CAD/PVD history, and is 37 years status post his initial CABG revascularization surgery done in [place] in 1984 and 25 years status post his second CABG surgery in 1996. He has had multiple peripheral vascular procedures including carotid endarterectomy, renal stenting, and has documented SFA occlusion. His last catheterization/ intervention in 2012 demonstrated diffuse disease in the vein graft supplying the marginal vessel and 2 additional Taxus stents inserted commencing ostially and extending to the proximal portion of the recently placed mid prior stent. He has a known occluded graft to the RCA with a mid RCA occlusion  with left to right collaterals. He also has subclavian steal. He does have mild hypokinesis inferolaterally in the basal inferior wall with an EF of 45% at that catheterization. He has chronic right bundle branch block.  Previously he had complained of mild chest anginal symptomatology typically when he starts exercise and particularly after eating. He had potential areas of myocardium which may be responsible for ischemia  Ranexa 500 mg twice a day was added to his medical regimen and subsequently  titrated to 1000 mg twice a day , which resolved his symptomatology.  Because of issues with atrial fibrillation and a trial of amiodarone therapy in the past, Ranexa dose was reduced due to concomitant medication.  In the past he did not tolerate higher dose amiodarone which resulted in unsteadiness and balance issues.  He was taken off amiodarone.  He is now in permanent atrial fibrillation.  He was found to have chronic mesenteric ischemia and underwent successful stenting of both the celiac and superior mesenteric arteries by Dr. Ruta Hinds.  Over the past year he had developed chronic right lower extremity pain felt to be ischemic in etiology/.  His right leg pain has improved following revascularization with his most recent femorofemoral bypass surgery.  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  In May 2021, LV function was 50 to 55%.  There is mild aortic stenosis with a mean gradient of 10.8 and peak gradient of 17.1.  His blood pressure today is stable.  He has developed progressive lower extremity edema which I suspect is due to markedly reduced diuretic dose.  He had previously been on torsemide 40 mg twice a day which is equivalent to a Lasix 80 mg twice daily regimen.  However most recently he is only on Lasix 40 mg.  I have recommended initially he increase his morning Lasix to 60 mg but if edema continues to increase this to 80 mg.  He will continue 40 mg in the  afternoon.  He is not having any anginal symptoms.  He will be following up with Dr. Oneida Alar.  I will see him in 4 months for follow-up evaluation.   Troy Sine, MD, [facility]  09/22/2019 9:43 PM

## 2019-09-18 ENCOUNTER — Other Ambulatory Visit: Payer: Self-pay | Admitting: Cardiovascular Disease

## 2019-09-19 DIAGNOSIS — L97512 Non-pressure chronic ulcer of other part of right foot with fat layer exposed: Secondary | ICD-10-CM | POA: Diagnosis not present

## 2019-09-19 DIAGNOSIS — I739 Peripheral vascular disease, unspecified: Secondary | ICD-10-CM | POA: Diagnosis not present

## 2019-09-22 ENCOUNTER — Encounter: Payer: Self-pay | Admitting: Cardiovascular Disease

## 2019-09-29 DIAGNOSIS — I5020 Unspecified systolic (congestive) heart failure: Secondary | ICD-10-CM | POA: Diagnosis not present

## 2019-10-07 ENCOUNTER — Other Ambulatory Visit: Payer: Self-pay | Admitting: Physician Assistant

## 2019-10-17 DIAGNOSIS — R600 Localized edema: Secondary | ICD-10-CM | POA: Diagnosis not present

## 2019-10-17 DIAGNOSIS — B351 Tinea unguium: Secondary | ICD-10-CM | POA: Insufficient documentation

## 2019-10-17 DIAGNOSIS — I739 Peripheral vascular disease, unspecified: Secondary | ICD-10-CM | POA: Diagnosis not present

## 2019-10-30 DIAGNOSIS — I5020 Unspecified systolic (congestive) heart failure: Secondary | ICD-10-CM | POA: Diagnosis not present

## 2019-11-07 DIAGNOSIS — D649 Anemia, unspecified: Secondary | ICD-10-CM | POA: Diagnosis not present

## 2019-11-07 DIAGNOSIS — D509 Iron deficiency anemia, unspecified: Secondary | ICD-10-CM | POA: Diagnosis not present

## 2019-11-07 DIAGNOSIS — D631 Anemia in chronic kidney disease: Secondary | ICD-10-CM | POA: Diagnosis not present

## 2019-11-07 DIAGNOSIS — N189 Chronic kidney disease, unspecified: Secondary | ICD-10-CM | POA: Diagnosis not present

## 2019-11-14 ENCOUNTER — Ambulatory Visit: Payer: Medicare PPO | Admitting: Vascular Surgery

## 2019-11-14 ENCOUNTER — Encounter: Payer: Self-pay | Admitting: Vascular Surgery

## 2019-11-14 ENCOUNTER — Ambulatory Visit (HOSPITAL_COMMUNITY)
Admission: RE | Admit: 2019-11-14 | Discharge: 2019-11-14 | Disposition: A | Discharge status: Home / Self Care | Payer: Medicare PPO | Source: Ambulatory Visit | Attending: Vascular Surgery | Admitting: Vascular Surgery

## 2019-11-14 ENCOUNTER — Other Ambulatory Visit: Payer: Self-pay

## 2019-11-14 VITALS — BP 146/73 | HR 75 | Temp 97.4°F | Resp 20 | Ht 65.0 in | Wt 133.4 lb

## 2019-11-14 DIAGNOSIS — I739 Peripheral vascular disease, unspecified: Secondary | ICD-10-CM | POA: Insufficient documentation

## 2019-11-14 NOTE — Progress Notes (Signed)
Patient is a 74 year old male who returns for follow-up today.  He underwent femoral-femoral bypass Jul 15, 2019.  This was done for rest pain and ulcers on his right foot.  His wrist pain has completely resolved.  He does not really walk much due to balance issues.  He also still has swelling in both legs.  He is asking today whether or not he can work return to wearing his compression stockings.  Of note he is also previously had celiac and superior mesenteric artery stenting in May 2020.  He has no abdominal pain.  He is on aspirin and a statin.  Review of systems: He has shortness of breath with minimal exertion.  He has no chest pain.  Past Medical History:  Diagnosis Date  . Anemia   . Angina   . Aortic stenosis   . Atrial fibrillation ([place])   . Blood transfusion   . Bronchitis   . Bulging discs    "8 of them; thoracic, lumbar, sacral area"  . CHF (congestive heart failure) ([place])   . Chronic back pain   . Chronic kidney disease    "mild kidney disease"  . Colon polyps   . Coronary artery disease 01/24/11   Successful PCI long segmental stensois  vein graft to the CX marginal vessel-graft previously stented prox. 3.0x58mm TAXUS stent mid seg 3.0x52mm TAXUS stent now tandem stens of 3.0x63mm & 3.0x29mm placed with the seg. 50,80 & 99% stenosis reduced to 0%  . DDD (degenerative disc disease)   . Diastolic heart failure ([place])   . Dysrhythmia    "PAC's and PVC's"  . Esophageal dilatation    "have had it done 7 times; last time ~ 2001"  . Esophageal stricture   . GERD (gastroesophageal reflux disease)   . Headache(784.0)   . Heart murmur   . Hiatal hernia   . Hyperlipidemia   . Hypertension   . Myocardial infarction ([place]) 1991  . Myocardial infarction [facility]) 2005   "had 3 heart attacks this year"  . Paroxysmal atrial fibrillation ([facility])   . Pneumonia 2020  . Pyloric stenosis   . RBBB   . Renal artery stenosis ([facility])    hhistory of right renal artery stenting and re-intervention  for "in-stent restenosis.  . S/P CABG (coronary artery bypass graft) [place]  . Shortness of breath    "when I had the heart attacks"  . Subclavian artery stenosis, left ([place])   . Ulcer    "small; Dr. Henrene Pastor found it 02/2010"    Past Surgical History:  Procedure Laterality Date  . ABDOMINAL AORTOGRAM W/LOWER EXTREMITY N/A 05/10/2019   Procedure: ABDOMINAL AORTOGRAM W/LOWER EXTREMITY;  Surgeon: Elam Dutch, MD;  Location: [place] [facility];  Service: Cardiovascular;  Laterality: N/A;  . ADENOIDECTOMY     "when I was an infant"  . APPENDECTOMY  1953  . APPLICATION OF WOUND VAC Left 07/23/2019   Procedure: Application Of Wound Vac;  Surgeon: Angelia Mould, MD;  Location: [place];  Service: Vascular;  Laterality: Left;  . BALLOON DILATION N/A 10/14/2018   Procedure: BALLOON DILATION;  Surgeon: Lavena Bullion, DO;  Location: [place];  Service: Gastroenterology;  Laterality: N/A;  . BIOPSY  07/26/2018   Procedure: BIOPSY;  Surgeon: Thornton Park, MD;  Location: [place];  Service: Gastroenterology;;  . BIOPSY  10/14/2018   Procedure: BIOPSY;  Surgeon: Lavena Bullion, DO;  Location: [place];  Service:  Gastroenterology;;  . CARDIOVERSION N/A 04/17/2017   Procedure: CARDIOVERSION;  Surgeon: Jerline Pain, MD;  Location: [facility] ENDOSCOPY;  Service: Cardiovascular;  Laterality: N/A;  . CAROTID ENDARTERECTOMY Right 1996   CE  . COLONOSCOPY WITH PROPOFOL N/A 10/14/2018   Procedure: COLONOSCOPY WITH PROPOFOL;  Surgeon: Lavena Bullion, DO;  Location: [place];  Service: Gastroenterology;  Laterality: N/A;  . CORONARY ANGIOPLASTY WITH STENT PLACEMENT  01/24/11   "today makes a total of 9 stents"  . CORONARY ARTERY BYPASS GRAFT  1984   CABG X 4  . CORONARY ARTERY BYPASS GRAFT  1996   CABG X 4  . ENDARTERECTOMY FEMORAL Right 07/15/2019   Procedure: RIGHT FEMORAL ENDARTARECTOMY WITH PROFUNDOPLASTY;  Surgeon: Elam Dutch,  MD;  Location: [place];  Service: Vascular;  Laterality: Right;  . ESOPHAGOGASTRODUODENOSCOPY (EGD) WITH PROPOFOL N/A 07/26/2018   Procedure: ESOPHAGOGASTRODUODENOSCOPY (EGD) WITH PROPOFOL;  Surgeon: Thornton Park, MD;  Location: [place];  Service: Gastroenterology;  Laterality: N/A;  . ESOPHAGOGASTRODUODENOSCOPY (EGD) WITH PROPOFOL N/A 10/14/2018   Procedure: ESOPHAGOGASTRODUODENOSCOPY (EGD) WITH PROPOFOL;  Surgeon: Lavena Bullion, DO;  Location: [place];  Service: Gastroenterology;  Laterality: N/A;  . EYE SURGERY  2009   detached retina in right eye  . FEMORAL-FEMORAL BYPASS GRAFT Bilateral 07/15/2019   Procedure: LEFT TO RIGHT FEMORAL-FEMORAL ARTERY;  Surgeon: Elam Dutch, MD;  Location: [place];  Service: Vascular;  Laterality: Bilateral;  . FRACTURE SURGERY  07/03/2018   fractured right hip sx.  . IR THORACENTESIS ASP PLEURAL SPACE W/IMG GUIDE  10/12/2018  . LEFT HEART CATHETERIZATION WITH CORONARY/GRAFT ANGIOGRAM N/A 01/24/2011   Procedure: LEFT HEART CATHETERIZATION WITH Beatrix Fetters;  Surgeon: Troy Sine, MD;  Location[facility] CATH LAB;  Service: Cardiovascular;  Laterality: N/A;  . PERIPHERAL VASCULAR INTERVENTION  07/16/2018   Procedure: PERIPHERAL VASCULAR INTERVENTION;  Surgeon: Elam Dutch, MD;  Location: [place] [facility];  Service: Cardiovascular;;  SMA and Celiac  . pylondial cyst removal    . RETINAL DETACHMENT SURGERY  04/2007   right  . TONSILLECTOMY  1972  . VISCERAL ANGIOGRAPHY N/A 07/16/2018   Procedure: VISCERAL ANGIOGRAPHY;  Surgeon: Elam Dutch, MD;  Location: [place] [facility];  Service: Cardiovascular;  Laterality: N/A;  . WOUND DEBRIDEMENT Left 07/23/2019   Procedure: INCISION AND DRAINAGE LEFT GROIN WOUND;  Surgeon: Angelia Mould, MD;  Location: [facility] OR;  Service: Vascular;  Laterality: Left;    Current Outpatient Medications on File Prior to Visit  Medication Sig Dispense Refill  . aspirin EC 81 MG tablet Take 1  tablet (81 mg total) by mouth daily. 150 tablet 2  . butalbital-aspirin-caffeine (FIORINAL) 50-325-40 MG capsule Take 1 capsule by mouth daily as needed for headache.     . calcium carbonate (TUMS - DOSED IN MG ELEMENTAL CALCIUM) 500 MG chewable tablet Chew 1 tablet (200 mg of elemental calcium total) by mouth 3 (three) times daily as needed for indigestion or heartburn. 30 tablet 0  . diltiazem (CARDIZEM CD) 120 MG 24 hr capsule Take 1 capsule (120 mg total) by mouth daily. 90 capsule 0  . dutasteride (AVODART) 0.5 MG capsule Take 0.5 mg by mouth daily.     Marland Kitchen esomeprazole (NEXIUM) 40 MG capsule Take 1 capsule (40 mg total) by mouth 2 (two) times daily before a meal. 120 capsule 3  . ezetimibe (ZETIA) 10 MG tablet TAKE 1 TABLET BY MOUTH EVERY DAY 90 tablet 2  . Gabapentin Enacarbil (HORIZANT) 600 MG TBCR  Take 600 mg by mouth daily as needed (restless leg). (1700)    . HYDROcodone-acetaminophen (NORCO) 5-325 MG tablet Take 1 tablet by mouth every 6 (six) hours as needed for moderate pain. 15 tablet 0  . Ketotifen Fumarate (ALAWAY OP) Place 1 drop into the right eye daily. Uses before putting contact Lens in R eye    . loratadine (CLARITIN) 10 MG tablet Take 10 mg by mouth daily as needed for allergies, rhinitis or itching.   3  . lovastatin (MEVACOR) 40 MG tablet TAKE 1 TABLET BY MOUTH AT BEDTIME (Patient taking differently: Take 20 mg by mouth at bedtime. ) 90 tablet 3  . metolazone (ZAROXOLYN) 2.5 MG tablet Take 2.5 mg by mouth daily as needed (fluid retention).     . Multiple Vitamins-Minerals (MULTIVITAMINS THER. W/MINERALS) TABS Take 1 tablet by mouth every evening.     . nitroGLYCERIN (NITROLINGUAL) 0.4 MG/SPRAY spray Place 1 spray under the tongue every 5 (five) minutes x 3 doses as needed for chest pain. 4.9 g 2  . Potassium Chloride ER 20 MEQ TBCR Take 20 mEq by mouth every evening.     . ranolazine (RANEXA) 500 MG 12 hr tablet Take 500 mg by mouth 2 (two) times daily.    Marland Kitchen senna-docusate  (SENOKOT-S) 8.6-50 MG tablet Take 1 tablet by mouth daily. (Patient taking differently: Take 1 tablet by mouth at bedtime. ) 30 tablet 6  . sucralfate (CARAFATE) 1 GM/10ML suspension Take 10 mLs (1 g total) by mouth 4 (four) times daily -  with meals and at bedtime. (Patient taking differently: Take 1 g by mouth in the morning, at noon, in the evening, and at bedtime. ) 1200 mL 11  . torsemide (DEMADEX) 20 MG tablet     . vitamin E 200 UNIT capsule Take 200 Units by mouth every evening.     . furosemide (LASIX) 40 MG tablet TAKE 60MG  (1.5 TAB) MG IN THE MORNING- IF STILL HAVING SWELLING CAN INCREASE TO 80MG  (2 TAB) IN THE MORNING AND AN AS NEEDED 40MG  (1 TAB) TAB IN THE AFTERNOON (Patient not taking: Reported on 11/14/2019) 180 tablet 3   No current facility-administered medications on file prior to visit.     Physical exam:  Vitals:   11/14/19 0942  BP: (!) 146/73  Pulse: 75  Resp: 20  Temp: (!) 97.4 F (36.3 C)  SpO2: 97%  Weight: 133 lb 6.4 oz (60.5 kg)  Height: 5\' 5"  (1.651 m)    Abdomen: Soft nontender nondistended  extremities: 2+ femoral pulses and easily palpable suprapubic graft pulse, 2 x 2 centimeter mass left groin consistent with hematoma versus seroma, nonpulsatile  No palpable pedal pulses 1+ edema bilateral lower extremities pretibial in the foot  Small ulceration nailbed right first toe with some dry eschar  Data: Patient had bilateral ABIs performed today which were 0.4 on the right 0.7 on the left unchanged from his previous ABIs 3 months ago.  Assessment: Patent femoral-femoral bypass with resolved rest pain.  Patient has reasonable perfusion at this point.  Since he is asymptomatic I would not consider any further interventions at this point unless he develops new symptoms.  As far as his mesenteric stent is concerned he is currently asymptomatic but he is due for a duplex of this.  Plan: Patient will follow up in 3 months time in our APP clinic with bilateral  ABIs and a mesenteric duplex scan.  He will continue his aspirin and statin.  He will  wear compression stockings on both lower extremities to control his leg swelling.  Ruta Hinds, MD [facility] Office: [phone]

## 2019-11-19 ENCOUNTER — Other Ambulatory Visit: Payer: Self-pay | Admitting: *Deleted

## 2019-11-19 DIAGNOSIS — I739 Peripheral vascular disease, unspecified: Secondary | ICD-10-CM

## 2019-11-19 DIAGNOSIS — K551 Chronic vascular disorders of intestine: Secondary | ICD-10-CM

## 2019-11-22 ENCOUNTER — Other Ambulatory Visit: Payer: Self-pay | Admitting: Cardiovascular Disease

## 2019-11-29 DIAGNOSIS — I480 Paroxysmal atrial fibrillation: Secondary | ICD-10-CM | POA: Diagnosis not present

## 2019-11-29 DIAGNOSIS — I25700 Atherosclerosis of coronary artery bypass graft(s), unspecified, with unstable angina pectoris: Secondary | ICD-10-CM | POA: Diagnosis not present

## 2019-11-29 DIAGNOSIS — I739 Peripheral vascular disease, unspecified: Secondary | ICD-10-CM | POA: Diagnosis not present

## 2019-11-29 DIAGNOSIS — D500 Iron deficiency anemia secondary to blood loss (chronic): Secondary | ICD-10-CM | POA: Diagnosis not present

## 2019-11-29 DIAGNOSIS — Z23 Encounter for immunization: Secondary | ICD-10-CM | POA: Diagnosis not present

## 2019-11-29 DIAGNOSIS — N1831 Chronic kidney disease, stage 3a: Secondary | ICD-10-CM | POA: Diagnosis not present

## 2019-11-30 DIAGNOSIS — I5020 Unspecified systolic (congestive) heart failure: Secondary | ICD-10-CM | POA: Diagnosis not present

## 2019-12-05 DIAGNOSIS — I051 Rheumatic mitral insufficiency: Secondary | ICD-10-CM | POA: Diagnosis not present

## 2019-12-05 DIAGNOSIS — I509 Heart failure, unspecified: Secondary | ICD-10-CM | POA: Diagnosis not present

## 2019-12-05 DIAGNOSIS — I130 Hypertensive heart and chronic kidney disease with heart failure and stage 1 through stage 4 chronic kidney disease, or unspecified chronic kidney disease: Secondary | ICD-10-CM | POA: Diagnosis not present

## 2019-12-05 DIAGNOSIS — N1831 Chronic kidney disease, stage 3a: Secondary | ICD-10-CM | POA: Diagnosis not present

## 2019-12-05 DIAGNOSIS — I480 Paroxysmal atrial fibrillation: Secondary | ICD-10-CM | POA: Diagnosis not present

## 2019-12-05 DIAGNOSIS — D500 Iron deficiency anemia secondary to blood loss (chronic): Secondary | ICD-10-CM | POA: Diagnosis not present

## 2019-12-05 DIAGNOSIS — I25110 Atherosclerotic heart disease of native coronary artery with unstable angina pectoris: Secondary | ICD-10-CM | POA: Diagnosis not present

## 2019-12-05 DIAGNOSIS — D631 Anemia in chronic kidney disease: Secondary | ICD-10-CM | POA: Diagnosis not present

## 2019-12-05 DIAGNOSIS — I739 Peripheral vascular disease, unspecified: Secondary | ICD-10-CM | POA: Diagnosis not present

## 2019-12-10 DIAGNOSIS — D631 Anemia in chronic kidney disease: Secondary | ICD-10-CM | POA: Diagnosis not present

## 2019-12-10 DIAGNOSIS — D500 Iron deficiency anemia secondary to blood loss (chronic): Secondary | ICD-10-CM | POA: Diagnosis not present

## 2019-12-10 DIAGNOSIS — I509 Heart failure, unspecified: Secondary | ICD-10-CM | POA: Diagnosis not present

## 2019-12-10 DIAGNOSIS — I739 Peripheral vascular disease, unspecified: Secondary | ICD-10-CM | POA: Diagnosis not present

## 2019-12-10 DIAGNOSIS — I25110 Atherosclerotic heart disease of native coronary artery with unstable angina pectoris: Secondary | ICD-10-CM | POA: Diagnosis not present

## 2019-12-10 DIAGNOSIS — I051 Rheumatic mitral insufficiency: Secondary | ICD-10-CM | POA: Diagnosis not present

## 2019-12-10 DIAGNOSIS — N1831 Chronic kidney disease, stage 3a: Secondary | ICD-10-CM | POA: Diagnosis not present

## 2019-12-10 DIAGNOSIS — I130 Hypertensive heart and chronic kidney disease with heart failure and stage 1 through stage 4 chronic kidney disease, or unspecified chronic kidney disease: Secondary | ICD-10-CM | POA: Diagnosis not present

## 2019-12-10 DIAGNOSIS — I480 Paroxysmal atrial fibrillation: Secondary | ICD-10-CM | POA: Diagnosis not present

## 2019-12-12 DIAGNOSIS — D500 Iron deficiency anemia secondary to blood loss (chronic): Secondary | ICD-10-CM | POA: Diagnosis not present

## 2019-12-12 DIAGNOSIS — I051 Rheumatic mitral insufficiency: Secondary | ICD-10-CM | POA: Diagnosis not present

## 2019-12-12 DIAGNOSIS — I509 Heart failure, unspecified: Secondary | ICD-10-CM | POA: Diagnosis not present

## 2019-12-12 DIAGNOSIS — N1831 Chronic kidney disease, stage 3a: Secondary | ICD-10-CM | POA: Diagnosis not present

## 2019-12-12 DIAGNOSIS — D631 Anemia in chronic kidney disease: Secondary | ICD-10-CM | POA: Diagnosis not present

## 2019-12-12 DIAGNOSIS — I130 Hypertensive heart and chronic kidney disease with heart failure and stage 1 through stage 4 chronic kidney disease, or unspecified chronic kidney disease: Secondary | ICD-10-CM | POA: Diagnosis not present

## 2019-12-12 DIAGNOSIS — I739 Peripheral vascular disease, unspecified: Secondary | ICD-10-CM | POA: Diagnosis not present

## 2019-12-12 DIAGNOSIS — I480 Paroxysmal atrial fibrillation: Secondary | ICD-10-CM | POA: Diagnosis not present

## 2019-12-12 DIAGNOSIS — I25110 Atherosclerotic heart disease of native coronary artery with unstable angina pectoris: Secondary | ICD-10-CM | POA: Diagnosis not present

## 2019-12-17 DIAGNOSIS — I051 Rheumatic mitral insufficiency: Secondary | ICD-10-CM | POA: Diagnosis not present

## 2019-12-17 DIAGNOSIS — I25110 Atherosclerotic heart disease of native coronary artery with unstable angina pectoris: Secondary | ICD-10-CM | POA: Diagnosis not present

## 2019-12-17 DIAGNOSIS — N1831 Chronic kidney disease, stage 3a: Secondary | ICD-10-CM | POA: Diagnosis not present

## 2019-12-17 DIAGNOSIS — I739 Peripheral vascular disease, unspecified: Secondary | ICD-10-CM | POA: Diagnosis not present

## 2019-12-17 DIAGNOSIS — D500 Iron deficiency anemia secondary to blood loss (chronic): Secondary | ICD-10-CM | POA: Diagnosis not present

## 2019-12-17 DIAGNOSIS — I130 Hypertensive heart and chronic kidney disease with heart failure and stage 1 through stage 4 chronic kidney disease, or unspecified chronic kidney disease: Secondary | ICD-10-CM | POA: Diagnosis not present

## 2019-12-17 DIAGNOSIS — I480 Paroxysmal atrial fibrillation: Secondary | ICD-10-CM | POA: Diagnosis not present

## 2019-12-17 DIAGNOSIS — D631 Anemia in chronic kidney disease: Secondary | ICD-10-CM | POA: Diagnosis not present

## 2019-12-17 DIAGNOSIS — I509 Heart failure, unspecified: Secondary | ICD-10-CM | POA: Diagnosis not present

## 2019-12-19 DIAGNOSIS — B351 Tinea unguium: Secondary | ICD-10-CM | POA: Diagnosis not present

## 2019-12-19 DIAGNOSIS — I480 Paroxysmal atrial fibrillation: Secondary | ICD-10-CM | POA: Diagnosis not present

## 2019-12-19 DIAGNOSIS — I509 Heart failure, unspecified: Secondary | ICD-10-CM | POA: Diagnosis not present

## 2019-12-19 DIAGNOSIS — D631 Anemia in chronic kidney disease: Secondary | ICD-10-CM | POA: Diagnosis not present

## 2019-12-19 DIAGNOSIS — I051 Rheumatic mitral insufficiency: Secondary | ICD-10-CM | POA: Diagnosis not present

## 2019-12-19 DIAGNOSIS — N1831 Chronic kidney disease, stage 3a: Secondary | ICD-10-CM | POA: Diagnosis not present

## 2019-12-19 DIAGNOSIS — I130 Hypertensive heart and chronic kidney disease with heart failure and stage 1 through stage 4 chronic kidney disease, or unspecified chronic kidney disease: Secondary | ICD-10-CM | POA: Diagnosis not present

## 2019-12-19 DIAGNOSIS — I25110 Atherosclerotic heart disease of native coronary artery with unstable angina pectoris: Secondary | ICD-10-CM | POA: Diagnosis not present

## 2019-12-19 DIAGNOSIS — D500 Iron deficiency anemia secondary to blood loss (chronic): Secondary | ICD-10-CM | POA: Diagnosis not present

## 2019-12-19 DIAGNOSIS — I739 Peripheral vascular disease, unspecified: Secondary | ICD-10-CM | POA: Diagnosis not present

## 2019-12-19 DIAGNOSIS — R600 Localized edema: Secondary | ICD-10-CM | POA: Diagnosis not present

## 2019-12-20 ENCOUNTER — Ambulatory Visit (HOSPITAL_BASED_OUTPATIENT_CLINIC_OR_DEPARTMENT_OTHER)
Admission: RE | Admit: 2019-12-20 | Discharge: 2019-12-20 | Disposition: A | Discharge status: Home / Self Care | Payer: Medicare PPO | Source: Ambulatory Visit | Attending: Cardiovascular Disease | Admitting: Cardiovascular Disease

## 2019-12-20 ENCOUNTER — Other Ambulatory Visit: Payer: Self-pay | Admitting: Cardiovascular Disease

## 2019-12-20 ENCOUNTER — Ambulatory Visit (HOSPITAL_COMMUNITY)
Admission: RE | Admit: 2019-12-20 | Discharge: 2019-12-20 | Disposition: A | Discharge status: Home / Self Care | Payer: Medicare PPO | Source: Ambulatory Visit | Attending: Cardiology | Admitting: Cardiology

## 2019-12-20 ENCOUNTER — Other Ambulatory Visit: Payer: Self-pay

## 2019-12-20 DIAGNOSIS — I6523 Occlusion and stenosis of bilateral carotid arteries: Secondary | ICD-10-CM | POA: Diagnosis not present

## 2019-12-20 DIAGNOSIS — I701 Atherosclerosis of renal artery: Secondary | ICD-10-CM | POA: Diagnosis not present

## 2019-12-20 DIAGNOSIS — I25810 Atherosclerosis of coronary artery bypass graft(s) without angina pectoris: Secondary | ICD-10-CM | POA: Diagnosis not present

## 2019-12-23 DIAGNOSIS — I480 Paroxysmal atrial fibrillation: Secondary | ICD-10-CM | POA: Diagnosis not present

## 2019-12-23 DIAGNOSIS — I509 Heart failure, unspecified: Secondary | ICD-10-CM | POA: Diagnosis not present

## 2019-12-23 DIAGNOSIS — N1831 Chronic kidney disease, stage 3a: Secondary | ICD-10-CM | POA: Diagnosis not present

## 2019-12-23 DIAGNOSIS — I130 Hypertensive heart and chronic kidney disease with heart failure and stage 1 through stage 4 chronic kidney disease, or unspecified chronic kidney disease: Secondary | ICD-10-CM | POA: Diagnosis not present

## 2019-12-23 DIAGNOSIS — I739 Peripheral vascular disease, unspecified: Secondary | ICD-10-CM | POA: Diagnosis not present

## 2019-12-23 DIAGNOSIS — D631 Anemia in chronic kidney disease: Secondary | ICD-10-CM | POA: Diagnosis not present

## 2019-12-23 DIAGNOSIS — I051 Rheumatic mitral insufficiency: Secondary | ICD-10-CM | POA: Diagnosis not present

## 2019-12-23 DIAGNOSIS — D500 Iron deficiency anemia secondary to blood loss (chronic): Secondary | ICD-10-CM | POA: Diagnosis not present

## 2019-12-23 DIAGNOSIS — I25110 Atherosclerotic heart disease of native coronary artery with unstable angina pectoris: Secondary | ICD-10-CM | POA: Diagnosis not present

## 2019-12-26 DIAGNOSIS — S72143A Displaced intertrochanteric fracture of unspecified femur, initial encounter for closed fracture: Secondary | ICD-10-CM | POA: Diagnosis not present

## 2019-12-26 DIAGNOSIS — S72141D Displaced intertrochanteric fracture of right femur, subsequent encounter for closed fracture with routine healing: Secondary | ICD-10-CM | POA: Diagnosis not present

## 2019-12-30 DIAGNOSIS — I5020 Unspecified systolic (congestive) heart failure: Secondary | ICD-10-CM | POA: Diagnosis not present

## 2020-01-01 DIAGNOSIS — D631 Anemia in chronic kidney disease: Secondary | ICD-10-CM | POA: Diagnosis not present

## 2020-01-01 DIAGNOSIS — I509 Heart failure, unspecified: Secondary | ICD-10-CM | POA: Diagnosis not present

## 2020-01-01 DIAGNOSIS — D500 Iron deficiency anemia secondary to blood loss (chronic): Secondary | ICD-10-CM | POA: Diagnosis not present

## 2020-01-01 DIAGNOSIS — I051 Rheumatic mitral insufficiency: Secondary | ICD-10-CM | POA: Diagnosis not present

## 2020-01-01 DIAGNOSIS — I25110 Atherosclerotic heart disease of native coronary artery with unstable angina pectoris: Secondary | ICD-10-CM | POA: Diagnosis not present

## 2020-01-01 DIAGNOSIS — I480 Paroxysmal atrial fibrillation: Secondary | ICD-10-CM | POA: Diagnosis not present

## 2020-01-01 DIAGNOSIS — N1831 Chronic kidney disease, stage 3a: Secondary | ICD-10-CM | POA: Diagnosis not present

## 2020-01-01 DIAGNOSIS — I130 Hypertensive heart and chronic kidney disease with heart failure and stage 1 through stage 4 chronic kidney disease, or unspecified chronic kidney disease: Secondary | ICD-10-CM | POA: Diagnosis not present

## 2020-01-01 DIAGNOSIS — I739 Peripheral vascular disease, unspecified: Secondary | ICD-10-CM | POA: Diagnosis not present

## 2020-01-02 ENCOUNTER — Other Ambulatory Visit: Payer: Self-pay | Admitting: Cardiovascular Disease

## 2020-01-04 DIAGNOSIS — I25110 Atherosclerotic heart disease of native coronary artery with unstable angina pectoris: Secondary | ICD-10-CM | POA: Diagnosis not present

## 2020-01-04 DIAGNOSIS — I130 Hypertensive heart and chronic kidney disease with heart failure and stage 1 through stage 4 chronic kidney disease, or unspecified chronic kidney disease: Secondary | ICD-10-CM | POA: Diagnosis not present

## 2020-01-04 DIAGNOSIS — D631 Anemia in chronic kidney disease: Secondary | ICD-10-CM | POA: Diagnosis not present

## 2020-01-04 DIAGNOSIS — N1831 Chronic kidney disease, stage 3a: Secondary | ICD-10-CM | POA: Diagnosis not present

## 2020-01-04 DIAGNOSIS — D500 Iron deficiency anemia secondary to blood loss (chronic): Secondary | ICD-10-CM | POA: Diagnosis not present

## 2020-01-04 DIAGNOSIS — I739 Peripheral vascular disease, unspecified: Secondary | ICD-10-CM | POA: Diagnosis not present

## 2020-01-04 DIAGNOSIS — I051 Rheumatic mitral insufficiency: Secondary | ICD-10-CM | POA: Diagnosis not present

## 2020-01-04 DIAGNOSIS — I480 Paroxysmal atrial fibrillation: Secondary | ICD-10-CM | POA: Diagnosis not present

## 2020-01-04 DIAGNOSIS — I509 Heart failure, unspecified: Secondary | ICD-10-CM | POA: Diagnosis not present

## 2020-01-08 DIAGNOSIS — N1831 Chronic kidney disease, stage 3a: Secondary | ICD-10-CM | POA: Diagnosis not present

## 2020-01-08 DIAGNOSIS — I739 Peripheral vascular disease, unspecified: Secondary | ICD-10-CM | POA: Diagnosis not present

## 2020-01-08 DIAGNOSIS — D631 Anemia in chronic kidney disease: Secondary | ICD-10-CM | POA: Diagnosis not present

## 2020-01-08 DIAGNOSIS — I130 Hypertensive heart and chronic kidney disease with heart failure and stage 1 through stage 4 chronic kidney disease, or unspecified chronic kidney disease: Secondary | ICD-10-CM | POA: Diagnosis not present

## 2020-01-08 DIAGNOSIS — I480 Paroxysmal atrial fibrillation: Secondary | ICD-10-CM | POA: Diagnosis not present

## 2020-01-08 DIAGNOSIS — I051 Rheumatic mitral insufficiency: Secondary | ICD-10-CM | POA: Diagnosis not present

## 2020-01-08 DIAGNOSIS — I25110 Atherosclerotic heart disease of native coronary artery with unstable angina pectoris: Secondary | ICD-10-CM | POA: Diagnosis not present

## 2020-01-08 DIAGNOSIS — I509 Heart failure, unspecified: Secondary | ICD-10-CM | POA: Diagnosis not present

## 2020-01-08 DIAGNOSIS — D500 Iron deficiency anemia secondary to blood loss (chronic): Secondary | ICD-10-CM | POA: Diagnosis not present

## 2020-01-15 DIAGNOSIS — I051 Rheumatic mitral insufficiency: Secondary | ICD-10-CM | POA: Diagnosis not present

## 2020-01-15 DIAGNOSIS — I739 Peripheral vascular disease, unspecified: Secondary | ICD-10-CM | POA: Diagnosis not present

## 2020-01-15 DIAGNOSIS — N1831 Chronic kidney disease, stage 3a: Secondary | ICD-10-CM | POA: Diagnosis not present

## 2020-01-15 DIAGNOSIS — D631 Anemia in chronic kidney disease: Secondary | ICD-10-CM | POA: Diagnosis not present

## 2020-01-15 DIAGNOSIS — I25110 Atherosclerotic heart disease of native coronary artery with unstable angina pectoris: Secondary | ICD-10-CM | POA: Diagnosis not present

## 2020-01-15 DIAGNOSIS — I509 Heart failure, unspecified: Secondary | ICD-10-CM | POA: Diagnosis not present

## 2020-01-15 DIAGNOSIS — I480 Paroxysmal atrial fibrillation: Secondary | ICD-10-CM | POA: Diagnosis not present

## 2020-01-15 DIAGNOSIS — D500 Iron deficiency anemia secondary to blood loss (chronic): Secondary | ICD-10-CM | POA: Diagnosis not present

## 2020-01-15 DIAGNOSIS — I130 Hypertensive heart and chronic kidney disease with heart failure and stage 1 through stage 4 chronic kidney disease, or unspecified chronic kidney disease: Secondary | ICD-10-CM | POA: Diagnosis not present

## 2020-01-21 DIAGNOSIS — I130 Hypertensive heart and chronic kidney disease with heart failure and stage 1 through stage 4 chronic kidney disease, or unspecified chronic kidney disease: Secondary | ICD-10-CM | POA: Diagnosis not present

## 2020-01-21 DIAGNOSIS — D631 Anemia in chronic kidney disease: Secondary | ICD-10-CM | POA: Diagnosis not present

## 2020-01-21 DIAGNOSIS — I739 Peripheral vascular disease, unspecified: Secondary | ICD-10-CM | POA: Diagnosis not present

## 2020-01-21 DIAGNOSIS — I480 Paroxysmal atrial fibrillation: Secondary | ICD-10-CM | POA: Diagnosis not present

## 2020-01-21 DIAGNOSIS — I509 Heart failure, unspecified: Secondary | ICD-10-CM | POA: Diagnosis not present

## 2020-01-21 DIAGNOSIS — D500 Iron deficiency anemia secondary to blood loss (chronic): Secondary | ICD-10-CM | POA: Diagnosis not present

## 2020-01-21 DIAGNOSIS — I25110 Atherosclerotic heart disease of native coronary artery with unstable angina pectoris: Secondary | ICD-10-CM | POA: Diagnosis not present

## 2020-01-21 DIAGNOSIS — N1831 Chronic kidney disease, stage 3a: Secondary | ICD-10-CM | POA: Diagnosis not present

## 2020-01-21 DIAGNOSIS — I051 Rheumatic mitral insufficiency: Secondary | ICD-10-CM | POA: Diagnosis not present

## 2020-01-22 ENCOUNTER — Ambulatory Visit: Payer: Medicare PPO | Admitting: Cardiovascular Disease

## 2020-01-22 ENCOUNTER — Encounter: Payer: Self-pay | Admitting: Cardiovascular Disease

## 2020-01-22 VITALS — BP 116/56 | HR 82 | Ht 65.0 in | Wt 130.2 lb

## 2020-01-22 DIAGNOSIS — E785 Hyperlipidemia, unspecified: Secondary | ICD-10-CM

## 2020-01-22 DIAGNOSIS — I10 Essential (primary) hypertension: Secondary | ICD-10-CM | POA: Diagnosis not present

## 2020-01-22 DIAGNOSIS — I739 Peripheral vascular disease, unspecified: Secondary | ICD-10-CM | POA: Diagnosis not present

## 2020-01-22 DIAGNOSIS — D649 Anemia, unspecified: Secondary | ICD-10-CM

## 2020-01-22 DIAGNOSIS — R600 Localized edema: Secondary | ICD-10-CM

## 2020-01-22 DIAGNOSIS — I25709 Atherosclerosis of coronary artery bypass graft(s), unspecified, with unspecified angina pectoris: Secondary | ICD-10-CM

## 2020-01-22 DIAGNOSIS — I4510 Unspecified right bundle-branch block: Secondary | ICD-10-CM

## 2020-01-22 DIAGNOSIS — I4821 Permanent atrial fibrillation: Secondary | ICD-10-CM

## 2020-01-22 DIAGNOSIS — Z951 Presence of aortocoronary bypass graft: Secondary | ICD-10-CM

## 2020-01-22 DIAGNOSIS — Z79899 Other long term (current) drug therapy: Secondary | ICD-10-CM

## 2020-01-22 MED ORDER — METOPROLOL SUCCINATE ER 25 MG PO TB24: 12.5000 mg | ORAL_TABLET | Freq: Every day | ORAL | 2 refills | Status: DC

## 2020-01-22 NOTE — Addendum Note (Signed)
Addended by: Vennie Homans on: 01/22/2020 10:49 AM   Modules accepted: Orders

## 2020-01-22 NOTE — Progress Notes (Signed)
Patient ID: [name], male   DOB: 23-May-1945, 74 y.o.   MRN: [id number]    Primary MD:  Dr. Garwin Brothers  HPI: [name] is a 74 y.o. male who is a former patient of Dr. Rollene Fare.  He presents for a 4 month follow-up cardiology evaluation.  [name] has an extensive cardiac and peripheral vascular history. He underwent initial CABG revascularization surgery in [place] in 1984. In 1996 he required re-do CABG revascularization surgery after all grafts were found to be occluded. At that time he also underwent right carotid endarterectomy. In 1998 he underwent stenting of his left main coronary artery and also had stenting done to the vein graft supplying the marginal vessel. He has history of right renal artery stenosis and underwent initial stenting with predilatation 2005. He has documented chronic right bundle branch block. He also significant peripheral vascular disease with bilateral SFA occlusion. He has known left subclavian artery occlusion with right subclavian stenosis. Has a history of hypertension, hyperlipidemia, and remotely smoked cigarettes. His last catheterization was done in November 2012 which showed significant native CAD with evidence for patent left main stent. He had an occluded circumflex marginal vessel from the AV groove circumflex. There was 80% ostial RCA stenosis with 90% stenosis in the mid right coronary artery proximal to the RV marginal branch and total occlusion of the mid RCA. An occluded vein graft supplying the right coronary artery but evidence for collateralization to the distal right artery the left coronary injection and injection of the grafts. The vein supplying the marginal vessel with the previously placed ostial proximal Taxus stent had in-stent narrowing of 50% followed by 95% stenosis in the body of the proximal third of the vessel graft prior to the previously placed mid grafts 3.0x20 mm Taxus stent. At that time, he underwent  difficult but successful intervention involving long segmental stenosis of the vein graft supplying the circumflex marginal vessel. Additional tandem 3.0x38 mm and 3.0x8 mm Taxus stents were inserted with excellent angiographic result.   He saw Dr. Rollene Fare in August 2014 and due to concern for progressive carotid stenoses repeat Doppler studies were done and he also saw Dr. Sherren Mocha Early for followup evaluation. Repeat imaging showed a patent right carotid endarterectomy site the Doppler velocity suggestive of 40  percent stenosis of the bilateral proximal internal carotid arteries. There was left subclavian steal noted with retrograde left vertebral artery flow, monophasic left subclavian artery flow and significant difference in the bilateral brachial pressures.  He established cardiology care with me in December 2014.  A nuclear perfusion study in May 2015 demonstrated mild inferobasal ischemia, most likely secondary to RCA distribution.    On 03/25/2014 he was seen by Dr. Martinique after he developed an episode of atrial fibrillation the day before.  An ECG in Dr. Latina Craver  office showed atrial fibrillation with rate of 120.  His atenolol was increased to 50 mg twice a day, and he converted to sinus rhythm after 18 hours.  When he saw Dr. Martinique the following day he was in sinus rhythm.  On the increased dose of beta blocker he denies recurrent palpitations.  He tells me he recently underwent a colonoscopy and had 3 polyps removed, but also required cauterization for another site that was oozing blood.  This reason, he was not restarted on anticoagulation but has been maintained on low-dose aspirin and Plavix.  He has seen Dr. Gwenlyn Found in follow-up of his PVD and is status post  left renal stenting.  He also is followed by Dr. Donnetta Hutching for his carotid disease.  In August 2017 he was seen by Kerin Ransom after he was found to be in atrial fibrillation 1 week previously at cardiac rehabilitation.  He was started on  amiodarone 200 mg twice a day for 1 week and then 200 mg daily and his atenolol was reduced to 25 mg twice a day.  He was seen several weeks later on 10/29/2015 by Lesia Hausen and was back in sinus rhythm.  He underwent carotid duplex imaging on 12/23/2015.  The right carotid endarterectomy site had Doppler velocities suggestive of a 60-79% stenosis. The left internal carotid artery velocity suggested a 40-59% stenoses.  Renal duplex imaging to just a patent right renal artery stent.  There was stable greater than 60% left renal artery stenosis.    When I saw him in November 2017 he was maintaining sinus rhythm and  since he was on amiodarone, I recommended he reduce lovastatin to 20 mg and added Zetia for continued aggressive lipid management.  He had progressive lower extremity edema and recommended he increase his torsemide.  In March 2018 he developed episodes of bronchitis.  Since that time his rhythm has been irregular.  He wore a Holter monitor which showed atrial fibrillation with a minimum rate at 52 at 4:02 AM was sleeping and maximum rate at 119.  In the afternoon with an average rate of 82 bpm.  There are frequent PVCs, rare couplets, occasional episodes of bigeminy and trigeminy but no episodes of VT.  There were no pauses.  Previously, his blood pressure had become low and hehad been on losartan and I saw him one month ago there was leg swelling, left greater than right.  He had a chest x-ray with Dr. Reesa Chew on 06/21/2016.  A  follow-up carotid Doppler study in April 2018 showed improvement in his velocities such that his right carotid endarterectomy was patent with velocities now suggestive of 40-59% stenosis.  The left internal carotid velocities suggested a 1-39% stenosis.   When I saw him in May 2018, he was in atrial fibrillation.  I discontinued Plavix and started him on eliquis 5 mg twice a day.  I recommended that he not to take aspirin with his GI history.  I increased amiodarone to 200  mg twice a day and reduced his Ranexa dose to 500 mg twice a day with concomitant therapy.  He was having lower extremity edema.  A Doppler study was negative for DVT by his primary physician.  I recommended support stockings.    I saw him 1 month later and he continued to be in atrial fibrillation.  At that time, we discussed potential DC cardioversion for restoration of sinus rhythm.  He underwent repeat echo on 08/24/2016 which showed an EF of 55-60%.  His left atrium was normal in size and his right atrium was mildly dilated.  PA pressure was increased at 48 mm.  There was moderate LVH. Marland Kitchen  He apparently was seen subsequent to that evaluation by Almyra Deforest and was having 2+ bilateral lower extremity edema.  Further discussion concerning cardioversion was undertaken but the patient initially deferred at that point and wanted to that it further.  He tells me 1 month ago he noticed some bright red blood per rectum.  As result, he self reduced his eliquis from 5 mg twice a day down to 5 mg daily.  Remotely, he has seen Dr. Scarlette Shorts in the  past.  His last colonoscopy was at least 5 years ago.  He had stopped taking amiodarone since he felt to contribute to fatigue, tremors, dizziness, and gait staggering.  The symptomshave improved off amiodarone antiarrhythmic therapy.  Her, he has noticed that at times his pulse can increase up to the low 100s with minimal activity.  He has now been using compression stockings for his ankle edema which has improved.    When I  saw him in November 2018 , I recommended eliquis 5 mg twice a day for full anticoagulation.  I started him on metoprolol 50 mg twice a day  I discussed options concerning continuing permanent AF versus possible Tikosyn or AF ablation and referred him for  EP evaluation.  He saw Dr. Curt Bears in January 2019. Due to his QTC prolongation  he was not felt to be a candidate for Tikosyn or sotalol a and also was felt to be high risk for ablation.  As result he  recommended reiitiation of low-dose amiodarone.n 04/12/2017.  He underwent successful cardioversion.  When he saw Kerin Ransom on 04/19/2017  He had complaints of increasing dyspnea on exertion and orthopnea, which were similar to his previous symptoms on amiodarone.  When he last saw Shanon Brow a on May 02, 2017 he also complained of a fine tremor since institution of amiodarone, and amiodarone  was reduced to 100 mg every other day.   He underwent carotid duplex imaging which showed mild bilateral carotid plaque 40-59% on the right and 1-39% in the left.  In addition, an abdominal renal duplex study showed patent right renal artery stent, moderate left renal artery stenosis, and significant SMA stenosis.  This was reviewed by Dr. Gwenlyn Found.  When seen in March 2019 he felt his tremor and loss of coordination on the higher dose of amiodarone  improved with the lower dose amiodarone.  He was not taking Demadex for the past 10 days and this has resulted in increasing leg swelling.  Subsequently, he was seen in the atrial fibrillation clinic by Orson Eva several occasions, last seen Jul 14, 2017.  Prior to that evaluation he had seen his PCP for intermittent fevers.  When last seen in A. fib clinic he was in sinus rhythm. He denies any recurrent symptoms.  He continues to be on amiodarone 100 mg every other day and Eliquis for anticoagulation.  He is on metoprolol 75 mg twice a day, Ranexa 500 mg twice a day and his dose was reduced when amiodarone was instituted.  He is on lovastatin 40 mg for hyperlipidemia.  He has been taking torsemide on an as-needed basis.  He has a prescription for diltiazem 30 mg to take if his heart rate increases above 100 and long as blood pressure is greater than 100.    When I saw him in September 2019 he was back in atrial fibrillation with ventricular rate at 76 bpm.  His right bundle branch block was stable and there was evidence for left posterior hemiblock.  Had only been  taking amiodarone 100 mg every other day and I suggested that he further titrate his back to 100 mg daily.  With this slightly higher dose he has noticed some slight increase in some balance and coordination issues.  He denies any anginal symptoms.  He has noted some leg swelling bilaterally.   Since I last saw him in November 2019, he has seen Dr. Curt Bears in January 2020.  He was not atrial fibrillation and has not  maintained sinus rhythm after his cardioversion.  At that time, he recommended discontinuance of amiodarone and that he continue in permanent atrial fibrillation.  At the end of February 2020 he developed increasing chest congestion, went to urgent care and was treated with antibiotics for 10 days.  Reportedly a chest x-ray was negative.  He has continued to have chest congestion.    He fractured his right hip in April and spent 4 days at [facility].  In May he underwent PV intervention by Dr. Ruta Hinds with stenting of his celiac artery and superior mesenteric artery due to high-grade stenoses and evidence for chronic mesenteric ischemia.  He developed a pseudoaneurysm which was successfully compressed on Jul 27, 2018.  He saw Dr. Gwenlyn Found in June 2020 for progressive lower extremity edema who doubled his torsemide to twice a day.  Subsequently he apparently was given a prescription for metolazone by Dr. Virgina Norfolk to take on Monday Wednesday and Friday.    I saw him in June 2020 at which time his blood pressure was stable but he had been experiencing progressive lower extremity edema necessitating doubling of his torsemide dose to 20 twice a day and initiation of metolazone by Dr. Reesa Chew.  He was not having any anginal symptoms.  He was last evaluated by Dr. Gwenlyn Found  in a telemedicine visit for his peripheral vascular disease including his renal artery stenosis, and known bilateral SFA occlusions.  He was not having any lifestyle limiting claudication.  Blood pressure and renal function had  remained stable.  He had moderate right and mild to moderate left internal carotid stenoses with plan noninvasive annual monitoring.  I last saw him a telemedicine visit in December 2020.  He was without chest pain.  His atrial fibrillation rate has been controlled.  His leg swelling has improved and is better but at times there is still some mild lower extremity swelling.  He has been taking torsemide 2 tablets twice a day.  He has not required use of metolazone in over 3 weeks.  He will be undergoing laboratory at the end of December at the cancer center who has been following his hemoglobin closely.  He had required a transfusion at [facility] in late summer.    I last saw him in July 2021.  Over the prior 14 months he was hospitalized on 7 occasions.  He underwent left to right femorofemoral bypass surgery by Dr. Oneida Alar on Jul 15, 1999.  He was readmitted to the hospital on Jul 22, 2019 due to a large fluid accumulation in the left groin along the tract of the graft.  He underwent evacuation of a lymphocele left main and placement of a negative pressure VAC dressing by Dr. Doren Custard on Jul 23, 2019.  Following revascularization, his chronic right leg pain had improved.  He was taken off Eliquis due to low hemoglobin.  He also underwent iron infusion therapy.  He has continued to experience 1-2+ pretibial edema bilaterally.  Apparently he had been on torsemide 40 mg twice a day but this was switched by Dr. Reesa Chew to just Lasix 40 mg daily.  He has chronic kidney disease with creatinine 1.58 May 2021.  Presently, he has felt fairly well but in admits to mild occasional chest discomfort.  Oftentimes this occurs while eating breakfast.  He has taken sublingual nitroglycerin with relief.  Also if he had taken his morning Cardizem this would not occur.  He admits to residual leg swelling particularly left foot pretibial region.  He has been taking furosemide 40 mg twice a day.  His blood pressure has been  stable on diltiazem 120 mg daily.  He continues to be on ranolazine 500 mg twice a day.  Approximately 1 time a month he takes 2.5 mg of metoprolol depending upon swelling.  He is on Zetia and lovastatin for hyperlipidemia.  He has restless legs and takes gabapentin 600 mg at 6 PM.  He walks with a walker he denies any chest discomfort while walking.  He admits to periods of increased heart rate which may increase to the low 100s.  His right foot wound has healed following his successful revascularization.  He presents for evaluation  Past Medical History:  Diagnosis Date  . Anemia   . Angina   . Aortic stenosis   . Atrial fibrillation ([place])   . Blood transfusion   . Bronchitis   . Bulging discs    "8 of them; thoracic, lumbar, sacral area"  . CHF (congestive heart failure) ([place])   . Chronic back pain   . Chronic kidney disease    "mild kidney disease"  . Colon polyps   . Coronary artery disease 01/24/11   Successful PCI long segmental stensois  vein graft to the CX marginal vessel-graft previously stented prox. 3.0x10m TAXUS stent mid seg 3.0x22mTAXUS stent now tandem stens of 3.0x3864m 3.0x8mm14maced with the seg. 50,80 & 99% stenosis reduced to 0%  . DDD (degenerative disc disease)   . Diastolic heart failure (HCC)[place]. Dysrhythmia    "PAC's and PVC's"  . Esophageal dilatation    "have had it done 7 times; last time ~ 2001"  . Esophageal stricture   . GERD (gastroesophageal reflux disease)   . Headache(784.0)   . Heart murmur   . Hiatal hernia   . Hyperlipidemia   . Hypertension   . Myocardial infarction (HCC)[place]  [facility]   "had 3 heart attacks this year"  . Paroxysmal atrial fibrillation (HCC)   . Pneumonia 2020  . Pyloric stenosis   . RBBB   . Renal artery stenosis (HCC)    hhistory of right renal artery stenting and re-intervention for "in-stent restenosis.  . S/P CABG (coronary artery [place] graft[place]    "when I had the heart attacks"  . Subclavian artery stenosis, left (HCC)[place]. Ulcer    "small; Dr. PerrHenrene Pastornd it 02/2010"    Past Surgical History:  Procedure Laterality Date  . ABDOMINAL AORTOGRAM W/LOWER EXTREMITY N/A 05/10/2019   Procedure: ABDOMINAL AORTOGRAM W/LOWER EXTREMITY;  Surgeon: FielElam Dutch;  [facility] [place];  Service: Cardiovascular;  Laterality: N/A;  . ADENOIDECTOMY     "when I was an infant"  . APPENDECTOMY  1953  . APPLICATION OF WOUND VAC Left 07/23/2019   Procedure: Application Of Wound Vac;  Surgeon: DickAngelia Mould;  [facility] [place];  Laterality: Left;  . BALLOON DILATION N/A 10/14/2018   Procedure: BALLOON DILATION;  Surgeon: CiriLavena Bullion;  [facility] [place] [place];  Laterality: N/A;  . BIOPSY  07/26/2018   Procedure: BIOPSY;  Surgeon: BeavThornton Park;  [facility];;  . BIOPSY  10/14/2018   Procedure: BIOPSY;  Surgeon: CiriLavena Bullion;  [facility] [place];;  . CARDIOVERSION N/A 04/17/2017  Procedure: CARDIOVERSION;  Surgeon: Jerline Pain, MD;  Location: [facility] ENDOSCOPY;  Service: Cardiovascular;  Laterality: N/A;  . CAROTID ENDARTERECTOMY Right 1996   CE  . COLONOSCOPY WITH PROPOFOL N/A 10/14/2018   Procedure: COLONOSCOPY WITH PROPOFOL;  Surgeon: Lavena Bullion, DO;  Location: [place];  Service: Gastroenterology;  Laterality: N/A;  . CORONARY ANGIOPLASTY WITH STENT PLACEMENT  01/24/11   "today makes a total of 9 stents"  . CORONARY ARTERY BYPASS GRAFT  1984   CABG X 4  . CORONARY ARTERY BYPASS GRAFT  1996   CABG X 4  . ENDARTERECTOMY FEMORAL Right 07/15/2019   Procedure: RIGHT FEMORAL ENDARTARECTOMY WITH PROFUNDOPLASTY;  Surgeon: Elam Dutch, MD;  Location: [place];  Service: Vascular;  Laterality: Right;  . ESOPHAGOGASTRODUODENOSCOPY (EGD) WITH  PROPOFOL N/A 07/26/2018   Procedure: ESOPHAGOGASTRODUODENOSCOPY (EGD) WITH PROPOFOL;  Surgeon: Thornton Park, MD;  Location: [place];  Service: Gastroenterology;  Laterality: N/A;  . ESOPHAGOGASTRODUODENOSCOPY (EGD) WITH PROPOFOL N/A 10/14/2018   Procedure: ESOPHAGOGASTRODUODENOSCOPY (EGD) WITH PROPOFOL;  Surgeon: Lavena Bullion, DO;  Location: [place];  Service: Gastroenterology;  Laterality: N/A;  . EYE SURGERY  2009   detached retina in right eye  . FEMORAL-FEMORAL BYPASS GRAFT Bilateral 07/15/2019   Procedure: LEFT TO RIGHT FEMORAL-FEMORAL ARTERY;  Surgeon: Elam Dutch, MD;  Location: [place];  Service: Vascular;  Laterality: Bilateral;  . FRACTURE SURGERY  07/03/2018   fractured right hip sx.  . IR THORACENTESIS ASP PLEURAL SPACE W/IMG GUIDE  10/12/2018  . LEFT HEART CATHETERIZATION WITH CORONARY/GRAFT ANGIOGRAM N/A 01/24/2011   Procedure: LEFT HEART CATHETERIZATION WITH Beatrix Fetters;  Surgeon: Troy Sine, MD;  Location[facility] CATH LAB;  Service: Cardiovascular;  Laterality: N/A;  . PERIPHERAL VASCULAR INTERVENTION  07/16/2018   Procedure: PERIPHERAL VASCULAR INTERVENTION;  Surgeon: Elam Dutch, MD;  Location: [place] CV LAB;  Service: Cardiovascular;;  SMA and Celiac  . pylondial cyst removal    . RETINAL DETACHMENT SURGERY  04/2007   right  . TONSILLECTOMY  1972  . VISCERAL ANGIOGRAPHY N/A 07/16/2018   Procedure: VISCERAL ANGIOGRAPHY;  Surgeon: Elam Dutch, MD;  Location: [place] CV LAB;  Service: Cardiovascular;  Laterality: N/A;  . WOUND DEBRIDEMENT Left 07/23/2019   Procedure: INCISION AND DRAINAGE LEFT GROIN WOUND;  Surgeon: Angelia Mould, MD;  Location: [place];  Service: Vascular;  Laterality: Left;    Allergies  Allergen Reactions  . Codeine Nausea And Vomiting  . Sulfa Antibiotics Rash  . Sulfonamide Derivatives Rash  . Oxycodone Nausea Only    Current Outpatient Medications  Medication Sig Dispense Refill  .  aspirin EC 81 MG tablet Take 1 tablet (81 mg total) by mouth daily. 150 tablet 2  . butalbital-aspirin-caffeine (FIORINAL) 50-325-40 MG capsule Take 1 capsule by mouth daily as needed for headache.     . diltiazem (CARDIZEM CD) 120 MG 24 hr capsule TAKE 1 CAPSULE BY MOUTH EVERY DAY 90 capsule 0  . dutasteride (AVODART) 0.5 MG capsule Take 0.5 mg by mouth daily.     Marland Kitchen esomeprazole (NEXIUM) 40 MG capsule Take 1 capsule (40 mg total) by mouth 2 (two) times daily before a meal. 120 capsule 3  . ezetimibe (ZETIA) 10 MG tablet TAKE 1 TABLET BY MOUTH EVERY DAY 90 tablet 2  . Gabapentin Enacarbil (HORIZANT) 600 MG TBCR Take 600 mg by mouth daily as needed (restless leg). (1700)    . Ketotifen Fumarate (ALAWAY OP) Place 1 drop into the right eye  daily. Uses before putting contact Lens in R eye    . loratadine (CLARITIN) 10 MG tablet Take 10 mg by mouth daily as needed for allergies, rhinitis or itching.   3  . lovastatin (MEVACOR) 40 MG tablet TAKE 1 TABLET BY MOUTH EVERYDAY AT BEDTIME 90 tablet 3  . metolazone (ZAROXOLYN) 2.5 MG tablet Take 2.5 mg by mouth daily as needed (fluid retention).     . Multiple Vitamins-Minerals (MULTIVITAMINS THER. W/MINERALS) TABS Take 1 tablet by mouth every evening.     . nitroGLYCERIN (NITROLINGUAL) 0.4 MG/SPRAY spray Place 1 spray under the tongue every 5 (five) minutes x 3 doses as needed for chest pain. 4.9 g 2  . Potassium Chloride ER 20 MEQ TBCR Take 20 mEq by mouth every evening.     . ranolazine (RANEXA) 500 MG 12 hr tablet Take 500 mg by mouth 2 (two) times daily.    Marland Kitchen senna-docusate (SENOKOT-S) 8.6-50 MG tablet Take 1 tablet by mouth daily. 30 tablet 6  . sucralfate (CARAFATE) 1 GM/10ML suspension Take 10 mLs (1 g total) by mouth 4 (four) times daily -  with meals and at bedtime. 1200 mL 11  . torsemide (DEMADEX) 20 MG tablet 2 (two) times daily.     . vitamin E 200 UNIT capsule Take 200 Units by mouth every evening.     . calcium carbonate (TUMS - DOSED IN MG  ELEMENTAL CALCIUM) 500 MG chewable tablet Chew 1 tablet (200 mg of elemental calcium total) by mouth 3 (three) times daily as needed for indigestion or heartburn. (Patient not taking: Reported on 01/22/2020) 30 tablet 0  . furosemide (LASIX) 40 MG tablet TAKE 60MG (1.5 TAB) MG IN THE MORNING- IF STILL HAVING SWELLING CAN INCREASE TO 80MG (2 TAB) IN THE MORNING AND AN AS NEEDED 40MG (1 TAB) TAB IN THE AFTERNOON (Patient not taking: Reported on 11/14/2019) 180 tablet 3  . metoprolol succinate (TOPROL XL) 25 MG 24 hr tablet Take 0.5 tablets (12.5 mg total) by mouth at bedtime. 45 tablet 2   No current facility-administered medications for this visit.    Social History   Socioeconomic History  . Marital status: Married    Spouse name: Not on file  . Number of children: Not on file  . Years of education: Not on file  . Highest education level: Not on file  Occupational History  . Not on file  Tobacco Use  . Smoking status: Former Smoker    Packs/day: 1.00    Years: 38.00    Pack years: 38.00    Types: Cigarettes    Quit date: 02/09/2005    Years since quitting: 14.9  . Smokeless tobacco: Never Used  . Tobacco comment: "quit smoking cigarrettes 2006"  Vaping Use  . Vaping Use: Never used  Substance and Sexual Activity  . Alcohol use: No    Alcohol/week: 0.0 standard drinks  . Drug use: No  . Sexual activity: Not on file  Other Topics Concern  . Not on file  Social History Narrative  . Not on file   Social Determinants of Health   Financial Resource Strain:   . Difficulty of Paying Living Expenses: Not on file  Food Insecurity:   . Worried About Charity fundraiser in the Last Year: Not on file  . Ran Out of Food in the Last Year: Not on file  Transportation Needs:   . Lack of Transportation (Medical): Not on file  . Lack of Transportation (Non-Medical): Not  on file  Physical Activity:   . Days of Exercise per Week: Not on file  . Minutes of Exercise per Session: Not on file    Stress:   . Feeling of Stress : Not on file  Social Connections:   . Frequency of Communication with Friends and Family: Not on file  . Frequency of Social Gatherings with Friends and Family: Not on file  . Attends Religious Services: Not on file  . Active Member of Clubs or Organizations: Not on file  . Attends Archivist Meetings: Not on file  . Marital Status: Not on file  Intimate Partner Violence:   . Fear of Current or Ex-Partner: Not on file  . Emotionally Abused: Not on file  . Physically Abused: Not on file  . Sexually Abused: Not on file   Socially he is married has 2 children 2 stepchildren. He quit tobacco in 2000.  Family History  Problem Relation Age of Onset  . Breast cancer Paternal Aunt   . Diabetes Sister   . Ulcers Father   . Heart disease Father   . Heart failure Father   . Heart attack Father   . AAA (abdominal aortic aneurysm) Father   . Stroke Mother   . Hypertension Mother   . 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 Henrene Pastor; mesenteric ischemia status post stenting of the celiac and superior mesenteric arteries. GU: Occasional concentrated or  dark urine without blood Musculoskeletal: Negative; no myalgias, joint pain, or weakness Hematologic/Oncology: Positive for anemia Endocrine: Negative; no heat/cold intolerance; no diabetes Neuro: Negative; no changes in balance, headaches Skin: Negative; No rashes or skin lesions Psychiatric: Negative; No behavioral problems, depression Sleep: Positive for restless legsy Other comprehensive 14 point system review is negative.   PE BP (!) 116/56   Pulse 82   Ht _0  (1.651  m)   Wt 130 lb 3.2 oz (59.1 kg)   BMI 21.67 kg/m    Repeat blood pressure by me was 128/62  Wt Readings from Last 3 Encounters:  01/22/20 130 lb 3.2 oz (59.1 kg)  11/14/19 133 lb 6.4 oz (60.5 kg)  09/16/19 130 lb (59 kg)   General: Alert, oriented, no distress. Bearded since the pandemic. Skin: normal turgor, no rashes, warm and dry HEENT: Normocephalic, atraumatic. Pupils equal round and reactive to light; sclera anicteric; extraocular muscles intact;  Nose without nasal septal hypertrophy Mouth/Parynx benign; Mallinpatti scale 3 Neck: No JVD, no carotid bruits; normal carotid upstroke Lungs: Without rales or wheezing Chest wall: without tenderness to palpitation Heart: PMI not displaced, irregularly irregular with a rate in the 80s, s1 s2 normal, 2/6 systolic murmur, no diastolic murmur, no rubs, gallops, thrills, or heaves Abdomen: soft, nontender; no hepatosplenomehaly, BS+; abdominal aorta nontender and not dilated by palpation. Back: no CVA tenderness Pulses 2+ Musculoskeletal: full range of motion, normal strength, no joint deformities Extremities: 1+ edema at the dorsum of his left foot.  Trace edema above the sock line pretibially; no clubbing cyanosis , Homan's sign negative  Neurologic: grossly nonfocal; Cranial nerves grossly wnl Psychologic: Normal mood and affect  .ECG (independently read by me): Atrial fibrillation at 82 bpm, right bundle  branch block, left posterior hemiblock small nondiagnostic inferior Q waves 3 and aVF,  July 2021 ECG (independently read by me): Atrial fibrillation at 76 bpm with isolated PVC.  Right bundle branch block with repolarization changes.  Small nondiagnostic inferior Q waves in 3 and aVF  April 2020 ECG (independently read by me): Atrial fibrillation at 96 bpm, right bundle branch block with repolarization changes.  May 25, 2018 ECG (independently read by me): Probable atrial flutter with 3-1 block and a ventricular rate at 85.   Bundle branch block with repolarization changes.    January 25, 2018 ECG (independently read by me): Atrial fibrillation at 77 bpm.  Right bundle branch block with repolarization changes.  QTc interval 506 ms.  November 22, 2017 ECG (independently read by me): Atrial fibrillation at 76 bpm.  Right bundle branch block with repolarization changes.  Left posterior hemiblock.  Nonspecific ST-T changes  May 2019 ECG (independently read by me): Normal sinus rhythm at 63 bpm.  Right bundle branch block with repolarization changes.  Nonspecific ST changes.  QTc interval 493 ms.    March 2019 ECG (independently read by me): normal sinus rhythm at 64 bpm.  Right bundle branch block with repolarization changes.  Left posterior hemiblock.  QTc interval 497 ms.    November 2018 ECG (independently read by me): Atrial flutter with variable block.  PVC.  Left posterior hemiblock.  Right bundle branch block.  June 2018 ECG (independently read by me): Atrial fibrillation with ventricular rate at 70 bpm.  Right bundle-branch block with repolarization changes.  Probable left posterior fascicular block.  07/21/2016 ECG (independently read by me): atrial fibrillation with a controlled ventricular rate at 71 bpm.  Right bundle branch block and left posterior hemiblock.  Small inferior Q waves.  November 2017 ECG (independently read by me): Sinus rhythm with a PVC.  Right bundle branch block and probable left posterior hemiblock.  Inferior Q waves with preserved R waves.  February 2017 ECG (independently read by me): normal sinus rhythm at 61 bpm. Probable left atrial enlargement. Right bundle branch block.  ECG (independently read by me): Sinus bradycardia 58 bpm.  Increased QTc interval 473 ms.  Right bundle-branch block.  May 2016 ECG (independently read by me): Sinus bradycardia 59 bpm with right bundle branch block.I ST-T changes.  QTc interval 475 ms.  PR interval 182 ms.  No ectopy.  February 2016 ECG  (independently read by me) normal sinus rhythm at 63 bpm.  Right bundle-branch block.  Probable left posterior fascicular block.  PR interval 170 ms.  QTC interval 480 ms.  November 2015 ECG (independently read by me): Normal sinus rhythm at 62 bpm.  Right bundle branch block.  Small nondiagnostic inferior Q waves.  Prior May 2015ECG : sinus rhythm at 58 beats per minute.  Right bundle branch block.  Prior December 2014 ECG: Sinus rhythm with sinus bradycardia 55 beats per minute. Right bundle branch block.  LABS: BMP Latest Ref Rng & Units 07/24/2019 07/23/2019 07/22/2019  Glucose 70 - 99 mg/dL 87 112(H) 111(H)  BUN 8 - 23 mg/dL 23 24(H) 25(H)  Creatinine 0.61 - 1.24 mg/dL 1.58(H) 1.51(H) 1.45(H)  BUN/Creat Ratio 10 - 24 - - -  Sodium 135 - 145 mmol/L 138 138 139  Potassium 3.5 - 5.1 mmol/L 4.3 3.6 4.5  Chloride 98 - 111 mmol/L 104 103 101  CO2 22 - 32 mmol/L _0 Calcium 8.9 - 10.3 mg/dL 8.6(L) 8.5(L) 9.0   Hepatic  Function Latest Ref Rng & Units 07/11/2019 12/07/2018 10/12/2018  Total Protein 6.5 - 8.1 g/dL 7.3 6.3 6.0(L)  Albumin 3.5 - 5.0 g/dL 3.6 3.5(L) -  AST 15 - 41 U/L 19 24 -  ALT 0 - 44 U/L 14 17 -  Alk Phosphatase 38 - 126 U/L 92 134(H) -  Total Bilirubin 0.3 - 1.2 mg/dL 0.8 0.6 -  Bilirubin, Direct 0.0 - 0.2 mg/dL - - -   CBC Latest Ref Rng & Units 07/24/2019 07/23/2019 07/22/2019  WBC 4.0 - 10.5 K/uL 6.2 6.1 7.1  Hemoglobin 13.0 - 17.0 g/dL 8.3(L) 8.3(L) 9.4(L)  Hematocrit 39 - 52 % 25.2(L) 24.9(L) 28.6(L)  Platelets 150 - 400 K/uL 151 148(L) 171   Lab Results  Component Value Date   MCV 99.6 07/24/2019   MCV 98.4 07/23/2019   MCV 100.0 07/22/2019   Lab Results  Component Value Date   TSH 0.857 07/24/2018    Lipid Panel     Component Value Date/Time   CHOL 143 05/25/2018 1027   TRIG 84 05/25/2018 1027   HDL 63 05/25/2018 1027   CHOLHDL 2.3 05/25/2018 1027   CHOLHDL 2.6 08/01/2014 0852   VLDL 18 08/01/2014 0852   LDLCALC 63 05/25/2018 1027      RADIOLOGY: No results found.  IMPRESSION:  1. Coronary artery disease involving coronary bypass graft of native heart with angina pectoris ([place])   2. Essential hypertension   3. Permanent atrial fibrillation ([place])   4. Hyperlipidemia with target LDL less than 70   5. Leg edema   6. Hx of CABG '84, '96, last PCI 2012   7. PAD (peripheral artery disease) ([place])   8. RBBB   9. Anemia, unspecified type   10. Medication management     ASSESSMENT AND PLAN: [name] is a 74 year old gentleman who has an extensive CAD/PVD history, and is 37 years status post his initial CABG revascularization surgery done in [place] in 1984 and 25 years status post his second CABG surgery in 1996. He has had multiple peripheral vascular procedures including carotid endarterectomy, renal stenting, and has documented SFA occlusion. His last catheterization/ intervention in 2012 demonstrated diffuse disease in the vein graft supplying the marginal vessel and 2 additional Taxus stents inserted commencing ostially and extending to the proximal portion of the recently placed mid prior stent. He has a known occluded graft to the RCA with a mid RCA occlusion with left to right collaterals. He also has subclavian steal. He does have mild hypokinesis inferolaterally in the basal inferior wall with an EF of 45% at that catheterization. He has chronic right bundle branch block.  Previously he had complained of mild chest anginal symptomatology typically when he starts exercise and particularly after eating. He had potential areas of myocardium which may be responsible for ischemia  Ranexa 500 mg twice a day was added to his medical regimen and subsequently  titrated to 1000 mg twice a day , which resolved his symptomatology.  Because of issues with atrial fibrillation and a trial of amiodarone therapy in the past, Ranexa dose was reduced due to concomitant medication.  In the past he did not tolerate higher dose  amiodarone which resulted in unsteadiness and balance issues.  He was taken off amiodarone.  He is now in permanent atrial fibrillation.  He was found to have chronic mesenteric ischemia and underwent successful stenting of both the celiac and superior mesenteric arteries by Dr. Ruta Hinds.  Over the past year  he had developed chronic right lower extremity pain felt to be ischemic in etiology/.  His right leg pain has improved following revascularization with his most recent femorofemoral bypass surgery.  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  His previous nonhealing wound on his right foot had healed with recent revascularization.  In May 2021, LV function was 50 to 55%.  There is mild aortic stenosis with a mean gradient of 10.8 and peak gradient of 17.1.  Presently his blood pressure today is stable.  His blood pressure today is stable.  He has developed progressive lower extremity edema which I suspect is due to markedly reduced diuretic dose.  His prior edema has improved but he continues to experience predominantly left leg edema.  This leg had undergone saphenous vein resection for his CABG revascularization.  Presently I am recommending support stockings of 20 to 30 mm.  He has experienced occasional palpitations and is in permanent atrial fibrillation.  I am adding very low-dose metoprolol succinate at 12.5 mg twice a day to his medical regimen which should provide improved rate control as well as potential anti-ischemic benefit.  He has stage IIIb chronic kidney disease.  He is also being followed by Dr. [name] Rumpf in [place] for his low hemoglobin which has improved following iron infusions.  He is no longer on Eliquis and takes a baby aspirin daily without recurrent bleeding.  He tells me hemoglobin when last checked by Dr. [name] Rumpf had increased from 8 up to 63.  I am recommending fasting laboratory be obtained including a comprehensive metabolic panel, CBC, TSH and lipid  studies.  He takes gabapentin for his restless leg syndrome on a daily basis.  I will see him in 6 months for reevaluation or sooner as needed.  Troy Sine, MD, [facility]  01/22/2020 10:30 AM

## 2020-01-22 NOTE — Patient Instructions (Signed)
Medication Instructions:  START- Metoprolol Succinate 12.5 mg by mouth daily at bedtime time  *If you need a refill on your cardiac medications before your next appointment, please call your pharmacy*   Lab Work: CBC, CMP, TSH and Fasting Lipids  If you have labs (blood work) drawn today and your tests are completely normal, you will receive your results only by:  [place] (if you have MyChart) OR  A paper copy in the mail If you have any lab test that is abnormal or we need to change your treatment, we will call you to review the results.   Testing/Procedures: None Ordered   Follow-Up: At [facility], you and your health needs are our priority.  As part of our continuing mission to provide you with exceptional heart care, we have created designated Provider Care Teams.  These Care Teams include your primary Cardiologist (physician) and Advanced Practice Providers (APPs -  Physician Assistants and Nurse Practitioners) who all work together to provide you with the care you need, when you need it.  We recommend signing up for the patient portal called "MyChart".  Sign up information is provided on this After Visit Summary.  MyChart is used to connect with patients for Virtual Visits (Telemedicine).  Patients are able to view lab/test results, encounter notes, upcoming appointments, etc.  Non-urgent messages can be sent to your provider as well.   To learn more about what you can do with MyChart, go to [email or web address].    Your next appointment:   6 month(s)  The format for your next appointment:   In Person  Provider:   You may see Shelva Majestic, MD or one of the following Advanced Practice Providers on your designated Care Team:    Almyra Deforest, PA-C  Fabian Sharp, PA-C or   Roby Lofts, [place]

## 2020-01-29 DIAGNOSIS — I25708 Atherosclerosis of coronary artery bypass graft(s), unspecified, with other forms of angina pectoris: Secondary | ICD-10-CM | POA: Diagnosis not present

## 2020-01-29 DIAGNOSIS — N183 Chronic kidney disease, stage 3 unspecified: Secondary | ICD-10-CM | POA: Diagnosis not present

## 2020-01-29 DIAGNOSIS — Z79899 Other long term (current) drug therapy: Secondary | ICD-10-CM | POA: Diagnosis not present

## 2020-01-29 DIAGNOSIS — E782 Mixed hyperlipidemia: Secondary | ICD-10-CM | POA: Diagnosis not present

## 2020-01-29 DIAGNOSIS — E785 Hyperlipidemia, unspecified: Secondary | ICD-10-CM | POA: Diagnosis not present

## 2020-01-29 DIAGNOSIS — I4821 Permanent atrial fibrillation: Secondary | ICD-10-CM | POA: Diagnosis not present

## 2020-01-29 DIAGNOSIS — Z1389 Encounter for screening for other disorder: Secondary | ICD-10-CM | POA: Diagnosis not present

## 2020-01-29 DIAGNOSIS — I10 Essential (primary) hypertension: Secondary | ICD-10-CM | POA: Diagnosis not present

## 2020-01-29 DIAGNOSIS — Z6822 Body mass index (BMI) 22.0-22.9, adult: Secondary | ICD-10-CM | POA: Diagnosis not present

## 2020-01-29 DIAGNOSIS — D631 Anemia in chronic kidney disease: Secondary | ICD-10-CM | POA: Diagnosis not present

## 2020-01-29 DIAGNOSIS — Z0000 Encounter for general adult medical examination without abnormal findings: Secondary | ICD-10-CM | POA: Diagnosis not present

## 2020-01-30 DIAGNOSIS — I5020 Unspecified systolic (congestive) heart failure: Secondary | ICD-10-CM | POA: Diagnosis not present

## 2020-01-30 LAB — LIPID PANEL
Chol/HDL Ratio: 2.1 ratio (ref 0.0–5.0)
Cholesterol, Total: 132 mg/dL (ref 100–199)
HDL: 63 mg/dL (ref >39)
LDL Chol Calc (NIH): 55 mg/dL (ref 0–99)
Triglycerides: 65 mg/dL (ref 0–149)
VLDL Cholesterol Cal: 14 mg/dL (ref 5–40)

## 2020-01-30 LAB — COMPREHENSIVE METABOLIC PANEL
ALT: 12 IU/L (ref 0–44)
AST: 23 IU/L (ref 0–40)
Albumin/Globulin Ratio: 1.5 (ref 1.2–2.2)
Albumin: 3.9 g/dL (ref 3.7–4.7)
Alkaline Phosphatase: 151 IU/L — ABNORMAL HIGH (ref 44–121)
BUN/Creatinine Ratio: 16 (ref 10–24)
BUN: 27 mg/dL (ref 8–27)
Bilirubin Total: <0.2 mg/dL (ref 0.0–1.2)
CO2: 25 mmol/L (ref 20–29)
Calcium: 8.7 mg/dL (ref 8.6–10.2)
Chloride: 102 mmol/L (ref 96–106)
Creatinine, Ser: 1.66 mg/dL — ABNORMAL HIGH (ref 0.76–1.27)
GFR calc Af Amer: 46 mL/min/{1.73_m2} — ABNORMAL LOW (ref >59)
GFR calc non Af Amer: 40 mL/min/{1.73_m2} — ABNORMAL LOW (ref >59)
Globulin, Total: 2.6 g/dL (ref 1.5–4.5)
Glucose: 88 mg/dL (ref 65–99)
Potassium: 4.2 mmol/L (ref 3.5–5.2)
Sodium: 143 mmol/L (ref 134–144)
Total Protein: 6.5 g/dL (ref 6.0–8.5)

## 2020-01-30 LAB — CBC
Hematocrit: 33.5 % — ABNORMAL LOW (ref 37.5–51.0)
Hemoglobin: 11.9 g/dL — ABNORMAL LOW (ref 13.0–17.7)
MCH: 34.6 pg — ABNORMAL HIGH (ref 26.6–33.0)
MCHC: 35.5 g/dL (ref 31.5–35.7)
MCV: 97 fL (ref 79–97)
Platelets: 157 10*3/uL (ref 150–450)
RBC: 3.44 x10E6/uL — ABNORMAL LOW (ref 4.14–5.80)
RDW: 12.5 % (ref 11.6–15.4)
WBC: 6.4 10*3/uL (ref 3.4–10.8)

## 2020-01-30 LAB — TSH: TSH: 0.964 u[IU]/mL (ref 0.450–4.500)

## 2020-02-11 ENCOUNTER — Telehealth: Payer: Self-pay | Admitting: Cardiovascular Disease

## 2020-02-11 NOTE — Telephone Encounter (Signed)
Patient is returning call to discuss lab results. 

## 2020-02-11 NOTE — Telephone Encounter (Signed)
Called patient back to convey [name] remarks about the pt's most recent set of labs. The patient verbalizes understanding and appreciation for the call.

## 2020-02-19 ENCOUNTER — Other Ambulatory Visit: Payer: Self-pay | Admitting: Hematology and Oncology

## 2020-02-19 DIAGNOSIS — D509 Iron deficiency anemia, unspecified: Secondary | ICD-10-CM

## 2020-02-29 DIAGNOSIS — I5020 Unspecified systolic (congestive) heart failure: Secondary | ICD-10-CM | POA: Diagnosis not present

## 2020-03-12 ENCOUNTER — Other Ambulatory Visit: Payer: Medicare PPO

## 2020-03-12 ENCOUNTER — Ambulatory Visit: Payer: Medicare PPO | Admitting: Oncology

## 2020-03-15 ENCOUNTER — Other Ambulatory Visit: Payer: Self-pay | Admitting: Oncology

## 2020-03-15 DIAGNOSIS — D509 Iron deficiency anemia, unspecified: Secondary | ICD-10-CM | POA: Insufficient documentation

## 2020-03-15 DIAGNOSIS — N189 Chronic kidney disease, unspecified: Secondary | ICD-10-CM | POA: Insufficient documentation

## 2020-03-15 DIAGNOSIS — D631 Anemia in chronic kidney disease: Secondary | ICD-10-CM | POA: Insufficient documentation

## 2020-03-15 NOTE — Progress Notes (Deleted)
[facility]  [address] [place],  [place]  [address] [phone]  Clinic Day:  03/15/2020  Referring physician: Garwin Brothers, MD   HISTORY OF PRESENT ILLNESS:  The patient is a 75 y.o. male with anemia secondary to renal insufficiency and iron deficiency.  In the past, IV Feraheme was effective in bolstering his iron stores.  He comes in today to reassess his iron and hemoglobin levels.  Overall, the patient claims to be doing fairly well.  He denies having increased fatigue or any overt forms of blood loss which concern him for a declining hemoglobin.   PHYSICAL EXAM:  There were no vitals taken for this visit. Wt Readings from Last 3 Encounters:  01/22/20 130 lb 3.2 oz (59.1 kg)  11/14/19 133 lb 6.4 oz (60.5 kg)  09/16/19 130 lb (59 kg)   There is no height or weight on file to calculate BMI. Performance status (ECOG): {CHL ONC [id number] Physical Exam  LABS:   CBC Latest Ref Rng & Units 01/29/2020 07/24/2019 07/23/2019  WBC 3.4 - 10.8 x10E3/uL 6.4 6.2 6.1  Hemoglobin 13.0 - 17.7 g/dL 11.9(L) 8.3(L) 8.3(L)  Hematocrit 37.5 - 51.0 % 33.5(L) 25.2(L) 24.9(L)  Platelets 150 - 450 x10E3/uL 157 151 148(L)   CMP Latest Ref Rng & Units 01/29/2020 07/24/2019 07/23/2019  Glucose 65 - 99 mg/dL 88 87 112(H)  BUN 8 - 27 mg/dL 27 23 24(H)  Creatinine 0.76 - 1.27 mg/dL 1.66(H) 1.58(H) 1.51(H)  Sodium 134 - 144 mmol/L 143 138 138  Potassium 3.5 - 5.2 mmol/L 4.2 4.3 3.6  Chloride 96 - 106 mmol/L 102 104 103  CO2 20 - 29 mmol/L 25 22 25   Calcium 8.6 - 10.2 mg/dL 8.7 8.6(L) 8.5(L)  Total Protein 6.0 - 8.5 g/dL 6.5 - -  Total Bilirubin 0.0 - 1.2 mg/dL <0.2 - -  Alkaline Phos 44 - 121 IU/L 151(H) - -  AST 0 - 40 IU/L 23 - -  ALT 0 - 44 IU/L 12 - -     No results found for: CEA1 / No results found for: CEA1 No results found for: PSA1 No results found for: UXL244 No results found for: CAN125  No results found for: Ronnald Ramp, A1GS,  A2GS, Arnaldo Natal, GAMS, MSPIKE, SPEI Lab Results  Component Value Date   TIBC 333 10/15/2018   TIBC 264 08/01/2014   FERRITIN 51 10/15/2018   FERRITIN 122 07/14/2018   FERRITIN 61 08/01/2014   IRONPCTSAT 8 (L) 10/15/2018   IRONPCTSAT 33 08/01/2014   Lab Results  Component Value Date   LDH 190 10/12/2018   LDH 231 (H) 07/13/2018       Component Value Date/Time   LDH 190 10/12/2018 0520    Recent Review Flowsheet Data    Oncology Labs Latest Ref Rng & Units 07/14/2018 10/12/2018 10/15/2018   FERRITIN 24 - 336 ng/mL 122 - 51   IRONPCTSAT 17.9 - 39.5 % - - 8(L)   LDH 98 - 192 U/L - 190 -       ASSESSMENT & PLAN:   Assessment/Plan:  A 75 y.o. male with anemia secondary to chronic renal insufficiency and iron deficiency.  His labs today show    As that is the case, I will see him back in 4 months for repeat clinical assessment.  The patient understands all the plans discussed today and is in agreement with them.      Chinmayi Rumer Macarthur Critchley, MD

## 2020-03-16 ENCOUNTER — Inpatient Hospital Stay: Payer: Medicare PPO | Admitting: Oncology

## 2020-03-16 ENCOUNTER — Inpatient Hospital Stay: Payer: Medicare PPO

## 2020-03-16 DIAGNOSIS — J029 Acute pharyngitis, unspecified: Secondary | ICD-10-CM | POA: Diagnosis not present

## 2020-03-23 ENCOUNTER — Telehealth: Payer: Medicare PPO | Admitting: Cardiology

## 2020-03-25 ENCOUNTER — Other Ambulatory Visit: Payer: Self-pay | Admitting: Cardiovascular Disease

## 2020-03-26 DIAGNOSIS — B351 Tinea unguium: Secondary | ICD-10-CM | POA: Diagnosis not present

## 2020-03-26 DIAGNOSIS — I739 Peripheral vascular disease, unspecified: Secondary | ICD-10-CM | POA: Diagnosis not present

## 2020-03-26 DIAGNOSIS — R600 Localized edema: Secondary | ICD-10-CM | POA: Diagnosis not present

## 2020-03-31 DIAGNOSIS — I5020 Unspecified systolic (congestive) heart failure: Secondary | ICD-10-CM | POA: Diagnosis not present

## 2020-04-02 ENCOUNTER — Ambulatory Visit (HOSPITAL_COMMUNITY)
Admission: RE | Admit: 2020-04-02 | Discharge: 2020-04-02 | Disposition: A | Discharge status: Home / Self Care | Payer: Medicare PPO | Source: Ambulatory Visit | Attending: Vascular Surgery | Admitting: Vascular Surgery

## 2020-04-02 ENCOUNTER — Ambulatory Visit (INDEPENDENT_AMBULATORY_CARE_PROVIDER_SITE_OTHER)
Admission: RE | Admit: 2020-04-02 | Discharge: 2020-04-02 | Disposition: A | Discharge status: Home / Self Care | Payer: Medicare PPO | Source: Ambulatory Visit | Attending: Vascular Surgery | Admitting: Vascular Surgery

## 2020-04-02 ENCOUNTER — Ambulatory Visit: Payer: Medicare PPO | Admitting: Physician Assistant

## 2020-04-02 ENCOUNTER — Other Ambulatory Visit: Payer: Self-pay

## 2020-04-02 VITALS — BP 120/58 | HR 67 | Temp 97.6°F | Resp 20 | Ht 65.0 in | Wt 137.4 lb

## 2020-04-02 DIAGNOSIS — I739 Peripheral vascular disease, unspecified: Secondary | ICD-10-CM | POA: Diagnosis not present

## 2020-04-02 DIAGNOSIS — Z87891 Personal history of nicotine dependence: Secondary | ICD-10-CM | POA: Diagnosis not present

## 2020-04-02 DIAGNOSIS — K551 Chronic vascular disorders of intestine: Secondary | ICD-10-CM

## 2020-04-02 NOTE — Progress Notes (Signed)
HISTORY AND PHYSICAL     CC:  follow up. Requesting Provider:  Garwin Brothers, MD  HPI: This is a 75 y.o. male who is here today for follow up for PAD.  He has hx of left to right femoral to femoral bypass grafting 07/15/2019 by Dr. Oneida Alar and this was done for rest pain and ulcers on his right foot.  He states when he woke up, he could instantly tell a difference and his rest pain was much improved.  He has hx of celiac and SMA stenting in May 2020 also by Dr. Oneida Alar.  He has remote hx of CABG in combination with right CEA by Dr. Donnetta Hutching in 1996 and his carotid disease is followed by his cardiologist.  He had 40-59% bilateral ICA stenosis in October 2021.  He also has hx of CAD and MI with multiple stent placements.  He also has hx of detached retina of the right eye in 2009.  He had BLE GSV harvest for CABG.  He has permanent afib.   Pt was last seen September 2021 by Dr. Oneida Alar and at that time, he had ABI of 0.4 on the right and 0.7 on the left, which was essentially unchanged from previous visit.    The pt returns today for follow up.  He states he has occasional nerve pain in his right foot but it goes away as fast as it comes on.  He does not have rest pain or non healing wounds.  He walks with a walker and his walking is more limited by his DDD and scoliosis.  He does have some swelling in both lower legs.  He states he has some redness of both feet and legs.  He has dry skin on both feet.  He states he feels his fem fem bypass and also listens with a stethoscope to make sure it is still working.  He was taken off his Eliquis for GIB.    The pt is on a statin for cholesterol management.    The pt is on an aspirin.    Other AC:  none The pt is on CCB, BB for hypertension.  The pt does not have diabetes. Tobacco hx:  former    Past Medical History:  Diagnosis Date  . Anemia   . Angina   . Aortic stenosis   . Atrial fibrillation ([place])   . Blood transfusion   . Bronchitis   . Bulging  discs    "8 of them; thoracic, lumbar, sacral area"  . CHF (congestive heart failure) ([place])   . Chronic back pain   . Chronic kidney disease    "mild kidney disease"  . Colon polyps   . Coronary artery disease 01/24/11   Successful PCI long segmental stensois  vein graft to the CX marginal vessel-graft previously stented prox. 3.0x54mm TAXUS stent mid seg 3.0x72mm TAXUS stent now tandem stens of 3.0x72mm & 3.0x72mm placed with the seg. 50,80 & 99% stenosis reduced to 0%  . DDD (degenerative disc disease)   . Diastolic heart failure ([place])   . Dysrhythmia    "PAC's and PVC's"  . Esophageal dilatation    "have had it done 7 times; last time ~ 2001"  . Esophageal stricture   . GERD (gastroesophageal reflux disease)   . Headache(784.0)   . Heart murmur   . Hiatal hernia   . Hyperlipidemia   . Hypertension   . Myocardial infarction ([place]) 1991  . Myocardial infarction [facility]) 2005   "had  3 heart attacks this year"  . Paroxysmal atrial fibrillation (HCC)   . Pneumonia 2020  . Pyloric stenosis   . RBBB   . Renal artery stenosis (H[facility])    hhistory of right renal artery stenting and re-intervention for "in-stent restenosis.  . S/P CABG (coronary artery bypass graft) [place]  . Shortness of breath    "when I had the heart attacks"  . Subclavian artery stenosis, left ([place])   . Ulcer    "small; Dr. Henrene Pastor found it 02/2010"    Past Surgical History:  Procedure Laterality Date  . ABDOMINAL AORTOGRAM W/LOWER EXTREMITY N/A 05/10/2019   Procedure: ABDOMINAL AORTOGRAM W/LOWER EXTREMITY;  Surgeon: Elam Dutch, MD;  Location: [place] [facility];  Service: Cardiovascular;  Laterality: N/A;  . ADENOIDECTOMY     "when I was an infant"  . APPENDECTOMY  1953  . APPLICATION OF WOUND VAC Left 07/23/2019   Procedure: Application Of Wound Vac;  Surgeon: Angelia Mould, MD;  Location: [place];  Service: Vascular;  Laterality: Left;  . BALLOON DILATION N/A  10/14/2018   Procedure: BALLOON DILATION;  Surgeon: Lavena Bullion, DO;  Location: [place];  Service: Gastroenterology;  Laterality: N/A;  . BIOPSY  07/26/2018   Procedure: BIOPSY;  Surgeon: Thornton Park, MD;  Location: [place];  Service: Gastroenterology;;  . BIOPSY  10/14/2018   Procedure: BIOPSY;  Surgeon: Lavena Bullion, DO;  Location: [place];  Service: Gastroenterology;;  . CARDIOVERSION N/A 04/17/2017   Procedure: CARDIOVERSION;  Surgeon: Jerline Pain, MD;  Location: [facility];  Service: Cardiovascular;  Laterality: N/A;  . CAROTID ENDARTERECTOMY Right 1996   CE  . COLONOSCOPY WITH PROPOFOL N/A 10/14/2018   Procedure: COLONOSCOPY WITH PROPOFOL;  Surgeon: Lavena Bullion, DO;  Location: [place];  Service: Gastroenterology;  Laterality: N/A;  . CORONARY ANGIOPLASTY WITH STENT PLACEMENT  01/24/11   "today makes a total of 9 stents"  . CORONARY ARTERY BYPASS GRAFT  1984   CABG X 4  . CORONARY ARTERY BYPASS GRAFT  1996   CABG X 4  . ENDARTERECTOMY FEMORAL Right 07/15/2019   Procedure: RIGHT FEMORAL ENDARTARECTOMY WITH PROFUNDOPLASTY;  Surgeon: Elam Dutch, MD;  Location: [place];  Service: Vascular;  Laterality: Right;  . ESOPHAGOGASTRODUODENOSCOPY (EGD) WITH PROPOFOL N/A 07/26/2018   Procedure: ESOPHAGOGASTRODUODENOSCOPY (EGD) WITH PROPOFOL;  Surgeon: Thornton Park, MD;  Location: [place];  Service: Gastroenterology;  Laterality: N/A;  . ESOPHAGOGASTRODUODENOSCOPY (EGD) WITH PROPOFOL N/A 10/14/2018   Procedure: ESOPHAGOGASTRODUODENOSCOPY (EGD) WITH PROPOFOL;  Surgeon: Lavena Bullion, DO;  Location: [place];  Service: Gastroenterology;  Laterality: N/A;  . EYE SURGERY  2009   detached retina in right eye  . FEMORAL-FEMORAL BYPASS GRAFT Bilateral 07/15/2019   Procedure: LEFT TO RIGHT FEMORAL-FEMORAL ARTERY;  Surgeon: Elam Dutch, MD;  Location: [place];  Service: Vascular;  Laterality: Bilateral;  . FRACTURE SURGERY  07/03/2018    fractured right hip sx.  . IR THORACENTESIS ASP PLEURAL SPACE W/IMG GUIDE  10/12/2018  . LEFT HEART CATHETERIZATION WITH CORONARY/GRAFT ANGIOGRAM N/A 01/24/2011   Procedure: LEFT HEART CATHETERIZATION WITH Beatrix Fetters;  Surgeon: Troy Sine, MD;  Location: [facility] CATH LAB;  Service: Cardiovascular;  Laterality: N/A;  . PERIPHERAL VASCULAR INTERVENTION  07/16/2018   Procedure: PERIPHERAL VASCULAR INTERVENTION;  Surgeon: Elam Dutch, MD;  Location: [place] [facility];  Service: Cardiovascular;;  SMA and Celiac  . pylondial cyst removal    .  RETINAL DETACHMENT SURGERY  04/2007   right  . TONSILLECTOMY  1972  . VISCERAL ANGIOGRAPHY N/A 07/16/2018   Procedure: VISCERAL ANGIOGRAPHY;  Surgeon: Elam Dutch, MD;  Location: [place] [facility];  Service: Cardiovascular;  Laterality: N/A;  . WOUND DEBRIDEMENT Left 07/23/2019   Procedure: INCISION AND DRAINAGE LEFT GROIN WOUND;  Surgeon: Angelia Mould, MD;  Location: [place];  Service: Vascular;  Laterality: Left;    Allergies  Allergen Reactions  . Codeine Nausea And Vomiting  . Sulfa Antibiotics Rash  . Sulfonamide Derivatives Rash  . Oxycodone Nausea Only    Current Outpatient Medications  Medication Sig Dispense Refill  . aspirin EC 81 MG tablet Take 1 tablet (81 mg total) by mouth daily. 150 tablet 2  . butalbital-aspirin-caffeine (FIORINAL) 50-325-40 MG capsule Take 1 capsule by mouth daily as needed for headache.     . diltiazem (CARDIZEM CD) 120 MG 24 hr capsule TAKE 1 CAPSULE BY MOUTH EVERY DAY 90 capsule 0  . dutasteride (AVODART) 0.5 MG capsule Take 0.5 mg by mouth daily.     Marland Kitchen esomeprazole (NEXIUM) 40 MG capsule Take 1 capsule (40 mg total) by mouth 2 (two) times daily before a meal. 120 capsule 3  . ezetimibe (ZETIA) 10 MG tablet TAKE 1 TABLET BY MOUTH EVERY DAY 90 tablet 2  . furosemide (LASIX) 40 MG tablet Take 40 mg by mouth 2 (two) times daily.    . Gabapentin Enacarbil (HORIZANT) 600 MG TBCR Take 600 mg  by mouth daily as needed (restless leg). (1700)    . Ketotifen Fumarate (ALAWAY OP) Place 1 drop into the right eye daily. Uses before putting contact Lens in R eye    . loratadine (CLARITIN) 10 MG tablet Take 10 mg by mouth daily as needed for allergies, rhinitis or itching.   3  . lovastatin (MEVACOR) 40 MG tablet TAKE 1 TABLET BY MOUTH EVERYDAY AT BEDTIME 90 tablet 3  . metolazone (ZAROXOLYN) 2.5 MG tablet Take 2.5 mg by mouth daily as needed (fluid retention).     . metoprolol succinate (TOPROL XL) 25 MG 24 hr tablet Take 0.5 tablets (12.5 mg total) by mouth at bedtime. 45 tablet 2  . Multiple Vitamins-Minerals (MULTIVITAMINS THER. W/MINERALS) TABS Take 1 tablet by mouth every evening.    . nitroGLYCERIN (NITROLINGUAL) 0.4 MG/SPRAY spray Place 1 spray under the tongue every 5 (five) minutes x 3 doses as needed for chest pain. 4.9 g 2  . Potassium Chloride ER 20 MEQ TBCR Take 20 mEq by mouth every evening.     . ranolazine (RANEXA) 500 MG 12 hr tablet Take 500 mg by mouth 2 (two) times daily.    Marland Kitchen senna-docusate (SENOKOT-S) 8.6-50 MG tablet Take 1 tablet by mouth daily. 30 tablet 6  . sucralfate (CARAFATE) 1 GM/10ML suspension Take 10 mLs (1 g total) by mouth 4 (four) times daily -  with meals and at bedtime. 1200 mL 11  . vitamin E 200 UNIT capsule Take 200 Units by mouth every evening.     No current facility-administered medications for this visit.    Family History  Problem Relation Age of Onset  . Breast cancer Paternal Aunt   . Diabetes Sister   . Ulcers Father   . Heart disease Father   . Heart failure Father   . Heart attack Father   . AAA (abdominal aortic aneurysm) Father   . Stroke Mother   . Hypertension Mother   . Colon cancer Neg  Hx     Social History   Socioeconomic History  . Marital status: Married    Spouse name: Not on file  . Number of children: Not on file  . Years of education: Not on file  . Highest education level: Not on file  Occupational History   . Not on file  Tobacco Use  . Smoking status: Former Smoker    Packs/day: 1.00    Years: 38.00    Pack years: 38.00    Types: Cigarettes    Quit date: 02/09/2005    Years since quitting: 15.1  . Smokeless tobacco: Never Used  . Tobacco comment: "quit smoking cigarrettes 2006"  Vaping Use  . Vaping Use: Never used  Substance and Sexual Activity  . Alcohol use: No    Alcohol/week: 0.0 standard drinks  . Drug use: No  . Sexual activity: Not on file  Other Topics Concern  . Not on file  Social History Narrative  . Not on file   Social Determinants of Health   Financial Resource Strain: Not on file  Food Insecurity: Not on file  Transportation Needs: Not on file  Physical Activity: Not on file  Stress: Not on file  Social Connections: Not on file  Intimate Partner Violence: Not on file     REVIEW OF SYSTEMS:   [X]  denotes positive finding, [ ]  denotes negative finding Cardiac  Comments:  Chest pain or chest pressure:    Shortness of breath upon exertion:    Short of breath when lying flat:    Irregular heart rhythm:        Vascular    Pain in calf, thigh, or hip brought on by ambulation:    Pain in feet at night that wakes you up from your sleep:     Blood clot in your veins:    Leg swelling:         Pulmonary    Oxygen at home:    Productive cough:     Wheezing:         Neurologic    Sudden weakness in arms or legs:     Sudden numbness in arms or legs:     Sudden onset of difficulty speaking or slurred speech:    Temporary loss of vision in one eye:     Problems with dizziness:         Gastrointestinal    Blood in stool:     Vomited blood:         Genitourinary    Burning when urinating:     Blood in urine:        Psychiatric    Major depression:         Hematologic    Bleeding problems:    Problems with blood clotting too easily:        Skin    Rashes or ulcers:        Constitutional    Fever or chills:      PHYSICAL  EXAMINATION:  Today's Vitals   04/02/20 1026  BP: (!) 120/58  Pulse: 67  Resp: 20  Temp: 97.6 F (36.4 C)  TempSrc: Temporal  SpO2: 97%  Weight: 137 lb 6.4 oz (62.3 kg)  Height: 5\' 5"  (1.651 m)  PainSc: 4   PainLoc: Leg   Body mass index is 22.86 kg/m.   General:  WDWN in NAD; vital signs documented above Gait: Not observed HENT: WNL, normocephalic Pulmonary: normal non-labored breathing , without wheezing Cardiac:  irregular HR; without carotid bruits Abdomen: soft, NT, no masses; aortic pulse is palpable Skin: with rash BLE Vascular Exam/Pulses:  Right Left  Radial 2+ (normal) 2+ (normal)  Femoral 2+ (normal) 2+ (normal)  Popliteal 1+ (weak) 1+ (weak)  DP monophasic Brisk monophasic  PT monophasic Brisk monophasic  Peroneal monophasic monophasic   Extremities:     Musculoskeletal: no muscle wasting or atrophy  Neurologic: A&O X 3;  No focal weakness or paresthesias are detected Psychiatric:  The pt has Normal affect.   Non-Invasive Vascular Imaging:   ABI's/TBI's on 04/02/2020: Right:  0.4/0.12 - Great toe pressure: 18 Left:  0.63/0.22 - Great toe pressure: 32  Mesenteric Arterial duplex on 04/02/2020: Duplex Findings:  +----------------------+--------+--------+------+--------+  Mesenteric      PSV cm/sEDV cm/sPlaqueComments  +----------------------+--------+--------+------+--------+  Aorta at SMA       52               +----------------------+--------+--------+------+--------+  Aorta at Celiac      44               +----------------------+--------+--------+------+--------+  Celiac Artery Proximal  77               +----------------------+--------+--------+------+--------+  SMA Origin        99               +----------------------+--------+--------+------+--------+  SMA Proximal       88                +----------------------+--------+--------+------+--------+  SMA Mid         100               +----------------------+--------+--------+------+--------+  CHA            70               +----------------------+--------+--------+------+--------+  Splenic          57               +----------------------+--------+--------+------+--------+     Mesenteric Technologist observations: No obvious in stent restenosis.  Difficult study secondary to high celiac origin , abdominal gas and  patient discomfort.   Summary:  Mesenteric:  Normal Celiac artery , Splenic artery, Hepatic artery and Superior  Mesenteric  artery findings.  Limited imaging showed patent left to right fem-fem bypass graft. IMA not  identified.   Previous ABI's/TBI's on 11/14/2019: Right:  0.43/0.29 - Great toe pressure: 38 Left:  0.65/0 - Great toe pressure:  87    ASSESSMENT/PLAN:: 75 y.o. male here for follow up for PAD with hx of right to left femoral to femoral bypass in May 2021 and celiac and SMA stenting in May 2020 by Dr. Oneida Alar  Mesenteric ischemia -celiac and SMA stents are patent and pt does not endorse abdominal pain.  -pt will f/u in one year with mesenteric duplex.  PAD -ABI's are essentially unchanged with decrease in toe pressures.  He does not have rest pain or wounds.  His walking is limited by his back issues.  His fem fem bypass is patent.  Discussed with pt to walk as he can tolerate.  We will see him back in 3 months with repeat ABI and fem fem duplex.  He will continue to check his fem fem for pulse as he has been doing.  He knows to call sooner if he develops any non healing wounds or rest pain or any other issues.  Dr. Oneida Alar previous note - he would not consider  any further interventions at this point unless he develops new sx.   -discussed with pt to keep his feet moisturized to help prevent cracking and developing  new wounds.   Carotid stenosis with hx of right CEA in 1996 by Dr. Donnetta Hutching 40-59% bilaterally on duplex in October 2021 and is followed by cardiology  -continue asa/statin.   Leontine Locket, [facility] [phone]  Clinic MD:   Oneida Alar

## 2020-04-06 ENCOUNTER — Other Ambulatory Visit: Payer: Self-pay

## 2020-04-06 DIAGNOSIS — I739 Peripheral vascular disease, unspecified: Secondary | ICD-10-CM

## 2020-04-07 ENCOUNTER — Other Ambulatory Visit: Payer: Self-pay | Admitting: Cardiovascular Disease

## 2020-04-15 DIAGNOSIS — H25813 Combined forms of age-related cataract, bilateral: Secondary | ICD-10-CM | POA: Diagnosis not present

## 2020-04-23 ENCOUNTER — Other Ambulatory Visit: Payer: Self-pay

## 2020-04-23 ENCOUNTER — Inpatient Hospital Stay: Payer: Medicare PPO | Admitting: Oncology

## 2020-04-23 ENCOUNTER — Other Ambulatory Visit: Payer: Self-pay | Admitting: Hematology and Oncology

## 2020-04-23 ENCOUNTER — Encounter: Payer: Self-pay | Admitting: Oncology

## 2020-04-23 ENCOUNTER — Other Ambulatory Visit: Payer: Self-pay | Admitting: Oncology

## 2020-04-23 ENCOUNTER — Inpatient Hospital Stay: Payer: Medicare PPO | Attending: Oncology

## 2020-04-23 VITALS — BP 120/60 | HR 75 | Temp 98.2°F | Resp 16 | Ht 65.0 in | Wt 137.6 lb

## 2020-04-23 DIAGNOSIS — D631 Anemia in chronic kidney disease: Secondary | ICD-10-CM

## 2020-04-23 DIAGNOSIS — D509 Iron deficiency anemia, unspecified: Secondary | ICD-10-CM

## 2020-04-23 DIAGNOSIS — N189 Chronic kidney disease, unspecified: Secondary | ICD-10-CM | POA: Diagnosis not present

## 2020-04-23 DIAGNOSIS — Z0001 Encounter for general adult medical examination with abnormal findings: Secondary | ICD-10-CM | POA: Diagnosis not present

## 2020-04-23 DIAGNOSIS — D649 Anemia, unspecified: Secondary | ICD-10-CM | POA: Diagnosis not present

## 2020-04-23 LAB — HEPATIC FUNCTION PANEL
ALT: 14 (ref 10–40)
AST: 24 (ref 14–40)
Alkaline Phosphatase: 146 — AB (ref 25–125)
Bilirubin, Total: 0.4

## 2020-04-23 LAB — CBC AND DIFFERENTIAL
HCT: 36 — AB (ref 41–53)
Hemoglobin: 12.4 — AB (ref 13.5–17.5)
Neutrophils Absolute: 5.33
Platelets: 158 (ref 150–399)
WBC: 7.5

## 2020-04-23 LAB — BASIC METABOLIC PANEL
BUN: 28 — AB (ref 4–21)
CO2: 29 — AB (ref 13–22)
Chloride: 102 (ref 99–108)
Creatinine: 1.9 — AB (ref 0.6–1.3)
Glucose: 110
Potassium: 4.1 (ref 3.4–5.3)
Sodium: 140 (ref 137–147)

## 2020-04-23 LAB — COMPREHENSIVE METABOLIC PANEL
Albumin: 4 (ref 3.5–5.0)
Calcium: 9 (ref 8.7–10.7)

## 2020-04-23 LAB — CBC
MCV: 100 — AB (ref 80–94)
RBC: 3.62 — AB (ref 3.87–5.11)

## 2020-05-01 DIAGNOSIS — I5020 Unspecified systolic (congestive) heart failure: Secondary | ICD-10-CM | POA: Diagnosis not present

## 2020-05-01 NOTE — Progress Notes (Signed)
[place]  [address] [place],  [place]  [address] [phone]  Clinic Day:  04/23/2020  Referring physician: Garwin Brothers, MD   HISTORY OF PRESENT ILLNESS:  The patient is a 75 y.o. male with anemia secondary to renal insufficiency.  Furthermore, recent labs also showed him to be iron deficient.  In the past, IV Feraheme was effective in bolstering his iron stores.  He comes in today to reassess his iron and hemoglobin levels.  Overall, the patient claims to be doing fairly well.  He denies having increased fatigue or any overt forms of blood loss which concern him for a declining hemoglobin.  PHYSICAL EXAM:  Blood pressure 120/60, pulse 75, temperature 98.2 F (36.8 C), resp. rate 16, height 5\' 5"  (1.651 m), weight 137 lb 9.6 oz (62.4 kg), SpO2 97 %. Wt Readings from Last 3 Encounters:  04/23/20 137 lb 9.6 oz (62.4 kg)  04/02/20 137 lb 6.4 oz (62.3 kg)  01/22/20 130 lb 3.2 oz (59.1 kg)   Body mass index is 22.9 kg/m. Performance status (ECOG): 1 - Symptomatic but completely ambulatory Physical Exam Constitutional:      Appearance: Normal appearance. He is not ill-appearing.  HENT:     Mouth/Throat:     Mouth: Mucous membranes are moist.     Pharynx: Oropharynx is clear. No oropharyngeal exudate or posterior oropharyngeal erythema.  Cardiovascular:     Rate and Rhythm: Normal rate and regular rhythm.     Heart sounds: No murmur heard. No friction rub. No gallop.   Pulmonary:     Effort: Pulmonary effort is normal. No respiratory distress.     Breath sounds: Normal breath sounds. No wheezing, rhonchi or rales.  Chest:  Breasts:     Right: No axillary adenopathy or supraclavicular adenopathy.     Left: No axillary adenopathy or supraclavicular adenopathy.    Abdominal:     General: Bowel sounds are normal. There is no distension.     Palpations: Abdomen is soft. There is no mass.     Tenderness: There is no abdominal tenderness.   Musculoskeletal:        General: No swelling.     Right lower leg: No edema.     Left lower leg: No edema.  Lymphadenopathy:     Cervical: No cervical adenopathy.     Upper Body:     Right upper body: No supraclavicular or axillary adenopathy.     Left upper body: No supraclavicular or axillary adenopathy.     Lower Body: No right inguinal adenopathy. No left inguinal adenopathy.  Skin:    General: Skin is warm.     Coloration: Skin is not jaundiced.     Findings: No lesion or rash.  Neurological:     General: No focal deficit present.     Mental Status: He is alert and oriented to person, place, and time. Mental status is at baseline.     Cranial Nerves: Cranial nerves are intact.  Psychiatric:        Mood and Affect: Mood normal.        Behavior: Behavior normal.        Thought Content: Thought content normal.     LABS:   CBC Latest Ref Rng & Units 04/23/2020 01/29/2020 07/24/2019  WBC - 7.5 6.4 6.2  Hemoglobin 13.5 - 17.5 12.4(A) 11.9(L) 8.3(L)  Hematocrit 41 - 53 36(A) 33.5(L) 25.2(L)  Platelets 150 - 399 158 157 151   CMP  Latest Ref Rng & Units 04/23/2020 01/29/2020 07/24/2019  Glucose 65 - 99 mg/dL - 88 87  BUN 4 - 21 28(A) 27 23  Creatinine 0.6 - 1.3 1.9(A) 1.66(H) 1.58(H)  Sodium 137 - 147 140 143 138  Potassium 3.4 - 5.3 4.1 4.2 4.3  Chloride 99 - 108 102 102 104  CO2 13 - 22 29(A) 25 22  Calcium 8.7 - 10.7 9.0 8.7 8.6(L)  Total Protein 6.0 - 8.5 g/dL - 6.5 -  Total Bilirubin 0.0 - 1.2 mg/dL - <0.2 -  Alkaline Phos 25 - 125 146(A) 151(H) -  AST 14 - 40 24 23 -  ALT 10 - 40 14 12 -     ASSESSMENT & PLAN:  Assessment/Plan:  A 75 y.o. male with anemia secondary to chronic renal insufficiency and iron deficiency.  I am pleased as his hemoglobin is even better at 12.4 today.  Furthermore, his iron parameters remain fine.  Clinically, the patient is doing well.  As that is the case, I will see him back in 6 months for repeat clinical assessment. The patient  understands all the plans discussed today and is in agreement with them.    Dequincy Macarthur Critchley, MD

## 2020-05-04 DIAGNOSIS — N1831 Chronic kidney disease, stage 3a: Secondary | ICD-10-CM | POA: Diagnosis not present

## 2020-05-04 DIAGNOSIS — G8929 Other chronic pain: Secondary | ICD-10-CM | POA: Diagnosis not present

## 2020-05-04 DIAGNOSIS — I480 Paroxysmal atrial fibrillation: Secondary | ICD-10-CM | POA: Diagnosis not present

## 2020-05-04 DIAGNOSIS — D500 Iron deficiency anemia secondary to blood loss (chronic): Secondary | ICD-10-CM | POA: Diagnosis not present

## 2020-05-04 DIAGNOSIS — M545 Low back pain, unspecified: Secondary | ICD-10-CM | POA: Diagnosis not present

## 2020-05-07 DIAGNOSIS — M545 Low back pain, unspecified: Secondary | ICD-10-CM | POA: Diagnosis not present

## 2020-05-11 DIAGNOSIS — R2689 Other abnormalities of gait and mobility: Secondary | ICD-10-CM | POA: Diagnosis not present

## 2020-05-11 DIAGNOSIS — M545 Low back pain, unspecified: Secondary | ICD-10-CM | POA: Diagnosis not present

## 2020-05-11 DIAGNOSIS — M62551 Muscle wasting and atrophy, not elsewhere classified, right thigh: Secondary | ICD-10-CM | POA: Diagnosis not present

## 2020-05-11 DIAGNOSIS — M62552 Muscle wasting and atrophy, not elsewhere classified, left thigh: Secondary | ICD-10-CM | POA: Diagnosis not present

## 2020-05-11 DIAGNOSIS — M79661 Pain in right lower leg: Secondary | ICD-10-CM | POA: Diagnosis not present

## 2020-05-14 DIAGNOSIS — M62551 Muscle wasting and atrophy, not elsewhere classified, right thigh: Secondary | ICD-10-CM | POA: Diagnosis not present

## 2020-05-14 DIAGNOSIS — M62552 Muscle wasting and atrophy, not elsewhere classified, left thigh: Secondary | ICD-10-CM | POA: Diagnosis not present

## 2020-05-14 DIAGNOSIS — M79661 Pain in right lower leg: Secondary | ICD-10-CM | POA: Diagnosis not present

## 2020-05-14 DIAGNOSIS — R2689 Other abnormalities of gait and mobility: Secondary | ICD-10-CM | POA: Diagnosis not present

## 2020-05-14 DIAGNOSIS — M545 Low back pain, unspecified: Secondary | ICD-10-CM | POA: Diagnosis not present

## 2020-05-18 DIAGNOSIS — M79661 Pain in right lower leg: Secondary | ICD-10-CM | POA: Diagnosis not present

## 2020-05-18 DIAGNOSIS — R2689 Other abnormalities of gait and mobility: Secondary | ICD-10-CM | POA: Diagnosis not present

## 2020-05-18 DIAGNOSIS — M62552 Muscle wasting and atrophy, not elsewhere classified, left thigh: Secondary | ICD-10-CM | POA: Diagnosis not present

## 2020-05-18 DIAGNOSIS — M545 Low back pain, unspecified: Secondary | ICD-10-CM | POA: Diagnosis not present

## 2020-05-18 DIAGNOSIS — M62551 Muscle wasting and atrophy, not elsewhere classified, right thigh: Secondary | ICD-10-CM | POA: Diagnosis not present

## 2020-05-21 DIAGNOSIS — M79661 Pain in right lower leg: Secondary | ICD-10-CM | POA: Diagnosis not present

## 2020-05-21 DIAGNOSIS — R2689 Other abnormalities of gait and mobility: Secondary | ICD-10-CM | POA: Diagnosis not present

## 2020-05-21 DIAGNOSIS — M62552 Muscle wasting and atrophy, not elsewhere classified, left thigh: Secondary | ICD-10-CM | POA: Diagnosis not present

## 2020-05-21 DIAGNOSIS — M62551 Muscle wasting and atrophy, not elsewhere classified, right thigh: Secondary | ICD-10-CM | POA: Diagnosis not present

## 2020-05-21 DIAGNOSIS — M545 Low back pain, unspecified: Secondary | ICD-10-CM | POA: Diagnosis not present

## 2020-05-25 ENCOUNTER — Other Ambulatory Visit: Payer: Self-pay

## 2020-05-25 ENCOUNTER — Encounter: Payer: Self-pay | Admitting: Cardiology

## 2020-05-25 ENCOUNTER — Ambulatory Visit: Payer: Medicare PPO | Admitting: Cardiology

## 2020-05-25 VITALS — BP 116/58 | HR 77 | Ht 65.0 in | Wt 137.8 lb

## 2020-05-25 DIAGNOSIS — M545 Low back pain, unspecified: Secondary | ICD-10-CM | POA: Diagnosis not present

## 2020-05-25 DIAGNOSIS — R2689 Other abnormalities of gait and mobility: Secondary | ICD-10-CM | POA: Diagnosis not present

## 2020-05-25 DIAGNOSIS — I4821 Permanent atrial fibrillation: Secondary | ICD-10-CM | POA: Diagnosis not present

## 2020-05-25 DIAGNOSIS — M62552 Muscle wasting and atrophy, not elsewhere classified, left thigh: Secondary | ICD-10-CM | POA: Diagnosis not present

## 2020-05-25 DIAGNOSIS — M62551 Muscle wasting and atrophy, not elsewhere classified, right thigh: Secondary | ICD-10-CM | POA: Diagnosis not present

## 2020-05-25 DIAGNOSIS — M79661 Pain in right lower leg: Secondary | ICD-10-CM | POA: Diagnosis not present

## 2020-05-25 MED ORDER — METOPROLOL SUCCINATE ER 25 MG PO TB24
25.0000 mg | ORAL_TABLET | Freq: Every day | ORAL | 3 refills | Status: DC
Start: 2020-05-25 — End: 2021-06-18

## 2020-05-25 NOTE — Progress Notes (Signed)
Electrophysiology Office Note   Date:  05/25/2020   ID:  [name], [name] [birth year], MRN [id number]  PCP:  Garwin Brothers, MD  Cardiologist:  Claiborne Billings Primary Electrophysiologist:  Joyclyn Plazola Meredith Leeds, MD    No chief complaint on file.    History of Present Illness: [name] is a 75 y.o. male who is being seen today for the evaluation of atrial fibrillation at the request of [name]. Presenting today for electrophysiology evaluation.    He has a history significant for coronary artery disease status post CABG in 1984 in [place], carotid endarterectomy, left main stent, renal artery stenosis status post dilation in 2005, right bundle branch block, peripheral vascular disease with bilateral SFA occlusions, subclavian artery occlusion, right subclavian stenosis, hypertension, hyperlipidemia, remote smoking history.He also has atrial fibrillation.  He has been taken off of amiodarone and is now permanent.  Today, denies symptoms of palpitations, chest pain, shortness of breath, orthopnea, PND, lower extremity edema, claudication, dizziness, presyncope, syncope, bleeding, or neurologic sequela. The patient is tolerating medications without difficulties.  He continues to have episodes of chest pain.  His chest pain is worse when his heart rate gets up around 100 bpm.  When his heart rate is in the 70s, he feels well.  He also gets chest pain in the mornings after he eats his breakfast.  He is unsure whether or not his chest pain is due to reflux or coronary disease.  May have 2021, he underwent femorofemoral bypass due to rest pain and ulcers on his right foot.  His ulcers have since healed.   Past Medical History:  Diagnosis Date  . Anemia   . Angina   . Aortic stenosis   . Atrial fibrillation ([place])   . Blood transfusion   . Bronchitis   . Bulging discs    "8 of them; thoracic, lumbar, sacral area"  . CHF (congestive heart failure) ([place])   . Chronic back pain   .  Chronic kidney disease    "mild kidney disease"  . Colon polyps   . Coronary artery disease 01/24/11   Successful PCI long segmental stensois  vein graft to the CX marginal vessel-graft previously stented prox. 3.0x68mm TAXUS stent mid seg 3.0x19mm TAXUS stent now tandem stens of 3.0x20mm & 3.0x90mm placed with the seg. 50,80 & 99% stenosis reduced to 0%  . DDD (degenerative disc disease)   . Diastolic heart failure ([place])   . Dysrhythmia    "PAC's and PVC's"  . Esophageal dilatation    "have had it done 7 times; last time ~ 2001"  . Esophageal stricture   . GERD (gastroesophageal reflux disease)   . Headache(784.0)   . Heart murmur   . Hiatal hernia   . Hyperlipidemia   . Hypertension   . Myocardial infarction ([place]) 1991  . Myocardial infarction [facility]) 2005   "had 3 heart attacks this year"  . Paroxysmal atrial fibrillation (HCC)   . Pneumonia 2020  . Pyloric stenosis   . RBBB   . Renal artery stenosis (HCC)    hhistory of right renal artery stenting and re-intervention for "in-stent restenosis.  . S/P CABG (coronary artery bypass graft) [place]  . Shortness of breath    "when I had the heart attacks"  . Subclavian artery stenosis, left ([place])   . Ulcer    "small; Dr. Henrene Pastor found it 02/2010"   Past Surgical History:  Procedure Laterality  Date  . ABDOMINAL AORTOGRAM W/LOWER EXTREMITY N/A 05/10/2019   Procedure: ABDOMINAL AORTOGRAM W/LOWER EXTREMITY;  Surgeon: Elam Dutch, MD;  Location: [place] CV [facility];  Service: Cardiovascular;  Laterality: N/A;  . ADENOIDECTOMY     "when I was an infant"  . APPENDECTOMY  1953  . APPLICATION OF WOUND VAC Left 07/23/2019   Procedure: Application Of Wound Vac;  Surgeon: Angelia Mould, MD;  Location: [place];  Service: Vascular;  Laterality: Left;  . BALLOON DILATION N/A 10/14/2018   Procedure: BALLOON DILATION;  Surgeon: Lavena Bullion, DO;  Location: [place];  Service:  Gastroenterology;  Laterality: N/A;  . BIOPSY  07/26/2018   Procedure: BIOPSY;  Surgeon: Thornton Park, MD;  Location: [place];  Service: Gastroenterology;;  . BIOPSY  10/14/2018   Procedure: BIOPSY;  Surgeon: Lavena Bullion, DO;  Location: [place];  Service: Gastroenterology;;  . CARDIOVERSION N/A 04/17/2017   Procedure: CARDIOVERSION;  Surgeon: Jerline Pain, MD;  Location: [facility] ENDOSCOPY;  Service: Cardiovascular;  Laterality: N/A;  . CAROTID ENDARTERECTOMY Right 1996   CE  . COLONOSCOPY WITH PROPOFOL N/A 10/14/2018   Procedure: COLONOSCOPY WITH PROPOFOL;  Surgeon: Lavena Bullion, DO;  Location: [place];  Service: Gastroenterology;  Laterality: N/A;  . CORONARY ANGIOPLASTY WITH STENT PLACEMENT  01/24/11   "today makes a total of 9 stents"  . CORONARY ARTERY BYPASS GRAFT  1984   CABG X 4  . CORONARY ARTERY BYPASS GRAFT  1996   CABG X 4  . ENDARTERECTOMY FEMORAL Right 07/15/2019   Procedure: RIGHT FEMORAL ENDARTARECTOMY WITH PROFUNDOPLASTY;  Surgeon: Elam Dutch, MD;  Location: [place];  Service: Vascular;  Laterality: Right;  . ESOPHAGOGASTRODUODENOSCOPY (EGD) WITH PROPOFOL N/A 07/26/2018   Procedure: ESOPHAGOGASTRODUODENOSCOPY (EGD) WITH PROPOFOL;  Surgeon: Thornton Park, MD;  Location: [place];  Service: Gastroenterology;  Laterality: N/A;  . ESOPHAGOGASTRODUODENOSCOPY (EGD) WITH PROPOFOL N/A 10/14/2018   Procedure: ESOPHAGOGASTRODUODENOSCOPY (EGD) WITH PROPOFOL;  Surgeon: Lavena Bullion, DO;  Location: [place];  Service: Gastroenterology;  Laterality: N/A;  . EYE SURGERY  2009   detached retina in right eye  . FEMORAL-FEMORAL BYPASS GRAFT Bilateral 07/15/2019   Procedure: LEFT TO RIGHT FEMORAL-FEMORAL ARTERY;  Surgeon: Elam Dutch, MD;  Location: [place];  Service: Vascular;  Laterality: Bilateral;  . FRACTURE SURGERY  07/03/2018   fractured right hip sx.  . IR THORACENTESIS ASP PLEURAL SPACE W/IMG GUIDE  10/12/2018  . LEFT HEART  CATHETERIZATION WITH CORONARY/GRAFT ANGIOGRAM N/A 01/24/2011   Procedure: LEFT HEART CATHETERIZATION WITH Beatrix Fetters;  Surgeon: Troy Sine, MD;  Location: [facility] CATH LAB;  Service: Cardiovascular;  Laterality: N/A;  . PERIPHERAL VASCULAR INTERVENTION  07/16/2018   Procedure: PERIPHERAL VASCULAR INTERVENTION;  Surgeon: Elam Dutch, MD;  Location: [place] CV [facility];  Service: Cardiovascular;;  SMA and Celiac  . pylondial cyst removal    . RETINAL DETACHMENT SURGERY  04/2007   right  . TONSILLECTOMY  1972  . VISCERAL ANGIOGRAPHY N/A 07/16/2018   Procedure: VISCERAL ANGIOGRAPHY;  Surgeon: Elam Dutch, MD;  Location: [place] [facility];  Service: Cardiovascular;  Laterality: N/A;  . WOUND DEBRIDEMENT Left 07/23/2019   Procedure: INCISION AND DRAINAGE LEFT GROIN WOUND;  Surgeon: Angelia Mould, MD;  Location: [facility] OR;  Service: Vascular;  Laterality: Left;     Current Outpatient Medications  Medication Sig Dispense Refill  . aspirin EC 81 MG tablet Take 1 tablet (81 mg total) by mouth daily. 150 tablet 2  .  butalbital-aspirin-caffeine (FIORINAL) 50-325-40 MG capsule Take 1 capsule by mouth daily as needed for headache.     . diltiazem (CARDIZEM CD) 120 MG 24 hr capsule TAKE 1 CAPSULE BY MOUTH EVERY DAY 90 capsule 0  . dutasteride (AVODART) 0.5 MG capsule Take 0.5 mg by mouth daily.     Marland Kitchen esomeprazole (NEXIUM) 40 MG capsule Take 1 capsule (40 mg total) by mouth 2 (two) times daily before a meal. 120 capsule 3  . ezetimibe (ZETIA) 10 MG tablet TAKE 1 TABLET BY MOUTH EVERY DAY 90 tablet 2  . furosemide (LASIX) 40 MG tablet Take 80 mg by mouth 2 (two) times daily.    . Gabapentin Enacarbil (HORIZANT) 600 MG TBCR Take 600 mg by mouth daily as needed (restless leg). (1700)    . Ketotifen Fumarate (ALAWAY OP) Place 1 drop into the right eye daily. Uses before putting contact Lens in R eye    . loratadine (CLARITIN) 10 MG tablet Take 10 mg by mouth daily as needed for  allergies, rhinitis or itching.   3  . lovastatin (MEVACOR) 40 MG tablet TAKE 1 TABLET BY MOUTH EVERYDAY AT BEDTIME 90 tablet 3  . metolazone (ZAROXOLYN) 2.5 MG tablet Take 2.5 mg by mouth daily as needed (fluid retention).     . Multiple Vitamins-Minerals (MULTIVITAMINS THER. W/MINERALS) TABS Take 1 tablet by mouth every evening.    . nitroGLYCERIN (NITROLINGUAL) 0.4 MG/SPRAY spray PLACE 1 SPRAY UNDER THE TONGUE EVERY 5 (FIVE) MINUTES X 3 DOSES AS NEEDED FOR CHEST PAIN. 0.4 g 2  . Potassium Chloride ER 20 MEQ TBCR Take 20 mEq by mouth as needed (With Metolazone).    . ranolazine (RANEXA) 500 MG 12 hr tablet Take 500 mg by mouth 2 (two) times daily.    Marland Kitchen senna-docusate (SENOKOT-S) 8.6-50 MG tablet Take 1 tablet by mouth daily. 30 tablet 6  . sucralfate (CARAFATE) 1 GM/10ML suspension Take 10 mLs (1 g total) by mouth 4 (four) times daily -  with meals and at bedtime. 1200 mL 11  . vitamin E 200 UNIT capsule Take 200 Units by mouth every evening.    . metoprolol succinate (TOPROL XL) 25 MG 24 hr tablet Take 1 tablet (25 mg total) by mouth at bedtime. 90 tablet 3   No current facility-administered medications for this visit.    Allergies:   Codeine, Sulfa antibiotics, Sulfonamide derivatives, and Oxycodone   Social History:  The patient  reports that he quit smoking about 15 years ago. His smoking use included cigarettes. He has a 38.00 pack-year smoking history. He has never used smokeless tobacco. He reports that he does not drink alcohol and does not use drugs.   Family History:  The patient's family history includes AAA (abdominal aortic aneurysm) in his father; Breast cancer in his paternal aunt; Diabetes in his sister; Heart attack in his father; Heart disease in his father; Heart failure in his father; Hypertension in his mother; Stroke in his mother; Ulcers in his father.   ROS:  Please see the history of present illness.   Otherwise, review of systems is positive for none.   All other  systems are reviewed and negative.   PHYSICAL EXAM: VS:  BP (!) 116/58   Pulse 77   Ht 5\' 5"  (1.651 m)   Wt 137 lb 12.8 oz (62.5 kg)   SpO2 98%   BMI 22.93 kg/m  , BMI Body mass index is 22.93 kg/m. GEN: Well nourished, well developed, in no acute  distress  HEENT: normal  Neck: no JVD, carotid bruits, or masses Cardiac: irregular; no murmurs, rubs, or gallops,no edema  Respiratory:  clear to auscultation bilaterally, normal work of breathing GI: soft, nontender, nondistended, + BS MS: no deformity or atrophy  Skin: warm and dry Neuro:  Strength and sensation are intact Psych: euthymic mood, full affect  EKG:  EKG is ordered today. Personal review of the ekg ordered shows atrial fibrillation, right bundle branch block, left posterior fascicular block  Recent Labs: 01/29/2020: TSH 0.964 04/23/2020: ALT 14; BUN 28; Creatinine 1.9; Hemoglobin 12.4; Platelets 158; Potassium 4.1; Sodium 140    Lipid Panel     Component Value Date/Time   CHOL 132 01/29/2020 1010   TRIG 65 01/29/2020 1010   HDL 63 01/29/2020 1010   CHOLHDL 2.1 01/29/2020 1010   CHOLHDL 2.6 08/01/2014 0852   VLDL 18 08/01/2014 0852   LDLCALC 55 01/29/2020 1010     Wt Readings from Last 3 Encounters:  05/25/20 137 lb 12.8 oz (62.5 kg)  04/23/20 137 lb 9.6 oz (62.4 kg)  04/02/20 137 lb 6.4 oz (62.3 kg)      Other studies Reviewed: Additional studies/ records that were reviewed today include: TTE 08/24/16  Review of the above records today demonstrates:  - Left ventricle: The cavity size was normal. Wall thickness was   increased in a pattern of moderate LVH. Systolic function was   normal. The estimated ejection fraction was in the range of 55%   to 60%. Wall motion was normal; there were no regional wall   motion abnormalities. The study is not technically sufficient to   allow evaluation of LV diastolic function. - Mitral valve: Calcified annulus. Mildly thickened leaflets .   There was mild  regurgitation. - Left atrium: The atrium was normal in size. - Right atrium: The atrium was mildly dilated. - Tricuspid valve: There was mild regurgitation. - Pulmonary arteries: PA peak pressure: 48 mm Hg (S). - Inferior vena cava: The vessel was dilated. The respirophasic   diameter changes were blunted (< 50%), consistent with elevated   central venous pressure.  Holter 06/17/16 - personally reviewed The predominant rhythm is atrial fibrillation.  The average heart rate is 82 bpm with a minimum heart rate of 50 to a maximum heart rate of 119 bpm.  There are frequent PVCs with several episodes of bigeminy and trigeminy and a very rare ventricular couplet.  There were no episodes of ventricular tachycardia.  There were no prolonged causes.  ASSESSMENT AND PLAN:  1.  Permanent atrial fibrillation: Currently on aspirin and metoprolol.  CHA2DS2-VASc of 3.  I am concerned that he has come off of his Eliquis due to GI bleeding.  He is asymptomatic from his atrial fibrillation, but he has had a very high stroke risk.  I am curious as to whether or not he would benefit from [place].  I have discussed this with the watchman implant or who felt that his femorofemoral bypass makes him not a candidate.  2.  Coronary artery disease status post CABG: Chest pain has continued though is stable.  No changes.  3.  Peripheral arterial disease: Currently followed by vascular surgery.  Status post recent femorofemoral bypass.  Continues to have reduced circulation in his lower extremities.  Current medicines are reviewed at length with the patient today.   The patient does not have concerns regarding his medicines.  The following changes were made today: None  Labs/ tests ordered today include:  Orders Placed This Encounter  Procedures  . EKG 12-Lead    Disposition:   FU with Valene Villa 12 months  Signed, Mitchelle Goerner Meredith Leeds, MD  05/25/2020 9:58 AM     [facility] [address] [place] [address] [phone] (office) [phone] (fax)

## 2020-05-25 NOTE — Patient Instructions (Signed)
Medication Instructions:  Your physician has recommended you make the following change in your medication:  1. INCREASE Metoprolol Succinate (Toprol) to 25 mg once daily  *If you need a refill on your cardiac medications before your next appointment, please call your pharmacy*   Lab Work: None ordered  Testing/Procedures: None ordered   Follow-Up: At [facility], you and your health needs are our priority.  As part of our continuing mission to provide you with exceptional heart care, we have created designated Provider Care Teams.  These Care Teams include your primary Cardiologist (physician) and Advanced Practice Providers (APPs -  Physician Assistants and Nurse Practitioners) who all work together to provide you with the care you need, when you need it.  Your next appointment:   1 year(s)  The format for your next appointment:   In Person  Provider:   Allegra Lai, MD    Thank you for choosing [facility]!!   Trinidad Curet, RN [phone]

## 2020-05-28 DIAGNOSIS — M545 Low back pain, unspecified: Secondary | ICD-10-CM | POA: Diagnosis not present

## 2020-05-28 DIAGNOSIS — R2689 Other abnormalities of gait and mobility: Secondary | ICD-10-CM | POA: Diagnosis not present

## 2020-05-28 DIAGNOSIS — M79661 Pain in right lower leg: Secondary | ICD-10-CM | POA: Diagnosis not present

## 2020-05-28 DIAGNOSIS — M62551 Muscle wasting and atrophy, not elsewhere classified, right thigh: Secondary | ICD-10-CM | POA: Diagnosis not present

## 2020-05-28 DIAGNOSIS — M62552 Muscle wasting and atrophy, not elsewhere classified, left thigh: Secondary | ICD-10-CM | POA: Diagnosis not present

## 2020-05-29 DIAGNOSIS — I5020 Unspecified systolic (congestive) heart failure: Secondary | ICD-10-CM | POA: Diagnosis not present

## 2020-06-01 DIAGNOSIS — R2689 Other abnormalities of gait and mobility: Secondary | ICD-10-CM | POA: Diagnosis not present

## 2020-06-01 DIAGNOSIS — M62551 Muscle wasting and atrophy, not elsewhere classified, right thigh: Secondary | ICD-10-CM | POA: Diagnosis not present

## 2020-06-01 DIAGNOSIS — M62552 Muscle wasting and atrophy, not elsewhere classified, left thigh: Secondary | ICD-10-CM | POA: Diagnosis not present

## 2020-06-01 DIAGNOSIS — M545 Low back pain, unspecified: Secondary | ICD-10-CM | POA: Diagnosis not present

## 2020-06-01 DIAGNOSIS — M79661 Pain in right lower leg: Secondary | ICD-10-CM | POA: Diagnosis not present

## 2020-06-04 DIAGNOSIS — M545 Low back pain, unspecified: Secondary | ICD-10-CM | POA: Diagnosis not present

## 2020-06-04 DIAGNOSIS — M62551 Muscle wasting and atrophy, not elsewhere classified, right thigh: Secondary | ICD-10-CM | POA: Diagnosis not present

## 2020-06-04 DIAGNOSIS — M79661 Pain in right lower leg: Secondary | ICD-10-CM | POA: Diagnosis not present

## 2020-06-04 DIAGNOSIS — R2689 Other abnormalities of gait and mobility: Secondary | ICD-10-CM | POA: Diagnosis not present

## 2020-06-04 DIAGNOSIS — M62552 Muscle wasting and atrophy, not elsewhere classified, left thigh: Secondary | ICD-10-CM | POA: Diagnosis not present

## 2020-06-08 DIAGNOSIS — M79661 Pain in right lower leg: Secondary | ICD-10-CM | POA: Diagnosis not present

## 2020-06-08 DIAGNOSIS — R2689 Other abnormalities of gait and mobility: Secondary | ICD-10-CM | POA: Diagnosis not present

## 2020-06-08 DIAGNOSIS — M545 Low back pain, unspecified: Secondary | ICD-10-CM | POA: Diagnosis not present

## 2020-06-08 DIAGNOSIS — M62551 Muscle wasting and atrophy, not elsewhere classified, right thigh: Secondary | ICD-10-CM | POA: Diagnosis not present

## 2020-06-08 DIAGNOSIS — M62552 Muscle wasting and atrophy, not elsewhere classified, left thigh: Secondary | ICD-10-CM | POA: Diagnosis not present

## 2020-06-11 DIAGNOSIS — M79661 Pain in right lower leg: Secondary | ICD-10-CM | POA: Diagnosis not present

## 2020-06-11 DIAGNOSIS — M545 Low back pain, unspecified: Secondary | ICD-10-CM | POA: Diagnosis not present

## 2020-06-11 DIAGNOSIS — M62551 Muscle wasting and atrophy, not elsewhere classified, right thigh: Secondary | ICD-10-CM | POA: Diagnosis not present

## 2020-06-11 DIAGNOSIS — M62552 Muscle wasting and atrophy, not elsewhere classified, left thigh: Secondary | ICD-10-CM | POA: Diagnosis not present

## 2020-06-11 DIAGNOSIS — R2689 Other abnormalities of gait and mobility: Secondary | ICD-10-CM | POA: Diagnosis not present

## 2020-06-15 DIAGNOSIS — M62551 Muscle wasting and atrophy, not elsewhere classified, right thigh: Secondary | ICD-10-CM | POA: Diagnosis not present

## 2020-06-15 DIAGNOSIS — M79661 Pain in right lower leg: Secondary | ICD-10-CM | POA: Diagnosis not present

## 2020-06-15 DIAGNOSIS — M62552 Muscle wasting and atrophy, not elsewhere classified, left thigh: Secondary | ICD-10-CM | POA: Diagnosis not present

## 2020-06-15 DIAGNOSIS — R2689 Other abnormalities of gait and mobility: Secondary | ICD-10-CM | POA: Diagnosis not present

## 2020-06-15 DIAGNOSIS — M545 Low back pain, unspecified: Secondary | ICD-10-CM | POA: Diagnosis not present

## 2020-06-18 DIAGNOSIS — M545 Low back pain, unspecified: Secondary | ICD-10-CM | POA: Diagnosis not present

## 2020-06-18 DIAGNOSIS — M79661 Pain in right lower leg: Secondary | ICD-10-CM | POA: Diagnosis not present

## 2020-06-18 DIAGNOSIS — M62551 Muscle wasting and atrophy, not elsewhere classified, right thigh: Secondary | ICD-10-CM | POA: Diagnosis not present

## 2020-06-18 DIAGNOSIS — R2689 Other abnormalities of gait and mobility: Secondary | ICD-10-CM | POA: Diagnosis not present

## 2020-06-18 DIAGNOSIS — M62552 Muscle wasting and atrophy, not elsewhere classified, left thigh: Secondary | ICD-10-CM | POA: Diagnosis not present

## 2020-06-25 ENCOUNTER — Other Ambulatory Visit: Payer: Self-pay | Admitting: Cardiovascular Disease

## 2020-06-25 DIAGNOSIS — R2689 Other abnormalities of gait and mobility: Secondary | ICD-10-CM | POA: Diagnosis not present

## 2020-06-25 DIAGNOSIS — M62551 Muscle wasting and atrophy, not elsewhere classified, right thigh: Secondary | ICD-10-CM | POA: Diagnosis not present

## 2020-06-25 DIAGNOSIS — M79661 Pain in right lower leg: Secondary | ICD-10-CM | POA: Diagnosis not present

## 2020-06-25 DIAGNOSIS — M545 Low back pain, unspecified: Secondary | ICD-10-CM | POA: Diagnosis not present

## 2020-06-25 DIAGNOSIS — M62552 Muscle wasting and atrophy, not elsewhere classified, left thigh: Secondary | ICD-10-CM | POA: Diagnosis not present

## 2020-06-29 DIAGNOSIS — M545 Low back pain, unspecified: Secondary | ICD-10-CM | POA: Diagnosis not present

## 2020-06-29 DIAGNOSIS — M62552 Muscle wasting and atrophy, not elsewhere classified, left thigh: Secondary | ICD-10-CM | POA: Diagnosis not present

## 2020-06-29 DIAGNOSIS — M62551 Muscle wasting and atrophy, not elsewhere classified, right thigh: Secondary | ICD-10-CM | POA: Diagnosis not present

## 2020-06-29 DIAGNOSIS — R2689 Other abnormalities of gait and mobility: Secondary | ICD-10-CM | POA: Diagnosis not present

## 2020-06-29 DIAGNOSIS — M79661 Pain in right lower leg: Secondary | ICD-10-CM | POA: Diagnosis not present

## 2020-06-29 DIAGNOSIS — I5020 Unspecified systolic (congestive) heart failure: Secondary | ICD-10-CM | POA: Diagnosis not present

## 2020-07-02 ENCOUNTER — Ambulatory Visit: Payer: Medicare PPO | Admitting: Physician Assistant

## 2020-07-02 ENCOUNTER — Ambulatory Visit (INDEPENDENT_AMBULATORY_CARE_PROVIDER_SITE_OTHER)
Admission: RE | Admit: 2020-07-02 | Discharge: 2020-07-02 | Disposition: A | Discharge status: Home / Self Care | Payer: Medicare PPO | Source: Ambulatory Visit | Attending: Vascular Surgery | Admitting: Vascular Surgery

## 2020-07-02 ENCOUNTER — Other Ambulatory Visit: Payer: Self-pay

## 2020-07-02 ENCOUNTER — Ambulatory Visit (HOSPITAL_COMMUNITY)
Admission: RE | Admit: 2020-07-02 | Discharge: 2020-07-02 | Disposition: A | Discharge status: Home / Self Care | Payer: Medicare PPO | Source: Ambulatory Visit | Attending: Vascular Surgery | Admitting: Vascular Surgery

## 2020-07-02 VITALS — BP 134/62 | HR 78 | Temp 97.8°F | Resp 20 | Ht 65.0 in | Wt 138.4 lb

## 2020-07-02 DIAGNOSIS — I739 Peripheral vascular disease, unspecified: Secondary | ICD-10-CM

## 2020-07-02 DIAGNOSIS — M79661 Pain in right lower leg: Secondary | ICD-10-CM | POA: Diagnosis not present

## 2020-07-02 DIAGNOSIS — M62551 Muscle wasting and atrophy, not elsewhere classified, right thigh: Secondary | ICD-10-CM | POA: Diagnosis not present

## 2020-07-02 DIAGNOSIS — M545 Low back pain, unspecified: Secondary | ICD-10-CM | POA: Diagnosis not present

## 2020-07-02 DIAGNOSIS — R2689 Other abnormalities of gait and mobility: Secondary | ICD-10-CM | POA: Diagnosis not present

## 2020-07-02 DIAGNOSIS — M62552 Muscle wasting and atrophy, not elsewhere classified, left thigh: Secondary | ICD-10-CM | POA: Diagnosis not present

## 2020-07-02 NOTE — Progress Notes (Signed)
Office Note     CC:  follow up Requesting Provider:  Garwin Brothers, MD  HPI: [name] is a 75 y.o. (10-28-1945) male who presents for follow-up of left to right femoral to femoral artery bypass graft performed by Dr. Oneida Alar in May 2021.  The patient's primary complaint is chronic lower extremity edema, tightness of his feet and ankles and areas of skin weeping.  He has history of degenerative disc disease and his ambulation is limited by chronic back pain.  He ambulates slowly with a rolling walker.  He denies calf pain when walking or rest pain.  His wife accompanies him today.  He was taken off of Eliquis secondary to GI bleed.  The pt is on a statin for cholesterol management.    The pt is on an aspirin.    Other AC:  none The pt is on CCB, BB for hypertension.  The pt does not have diabetes. Tobacco hx:  former Past Medical History:  Diagnosis Date  . Anemia   . Angina   . Aortic stenosis   . Atrial fibrillation ([place])   . Blood transfusion   . Bronchitis   . Bulging discs    "8 of them; thoracic, lumbar, sacral area"  . CHF (congestive heart failure) ([place])   . Chronic back pain   . Chronic kidney disease    "mild kidney disease"  . Colon polyps   . Coronary artery disease 01/24/11   Successful PCI long segmental stensois  vein graft to the CX marginal vessel-graft previously stented prox. 3.0x18mm TAXUS stent mid seg 3.0x57mm TAXUS stent now tandem stens of 3.0x86mm & 3.0x20mm placed with the seg. 50,80 & 99% stenosis reduced to 0%  . DDD (degenerative disc disease)   . Diastolic heart failure ([place])   . Dysrhythmia    "PAC's and PVC's"  . Esophageal dilatation    "have had it done 7 times; last time ~ 2001"  . Esophageal stricture   . GERD (gastroesophageal reflux disease)   . Headache(784.0)   . Heart murmur   . Hiatal hernia   . Hyperlipidemia   . Hypertension   . Myocardial infarction ([place]) 1991  . Myocardial infarction [facility]) 2005   "had 3 heart attacks  this year"  . Paroxysmal atrial fibrillation ([facility])   . Pneumonia 2020  . Pyloric stenosis   . RBBB   . Renal artery stenosis ([facility])    hhistory of right renal artery stenting and re-intervention for "in-stent restenosis.  . S/P CABG (coronary artery bypass graft) [place]  . Shortness of breath    "when I had the heart attacks"  . Subclavian artery stenosis, left ([place])   . Ulcer    "small; Dr. Henrene Pastor found it 02/2010"    Past Surgical History:  Procedure Laterality Date  . ABDOMINAL AORTOGRAM W/LOWER EXTREMITY N/A 05/10/2019   Procedure: ABDOMINAL AORTOGRAM W/LOWER EXTREMITY;  Surgeon: Elam Dutch, MD;  Location: [place] [facility];  Service: Cardiovascular;  Laterality: N/A;  . ADENOIDECTOMY     "when I was an infant"  . APPENDECTOMY  1953  . APPLICATION OF WOUND VAC Left 07/23/2019   Procedure: Application Of Wound Vac;  Surgeon: Angelia Mould, MD;  Location: [place];  Service: Vascular;  Laterality: Left;  . BALLOON DILATION N/A 10/14/2018   Procedure: BALLOON DILATION;  Surgeon: Lavena Bullion, DO;  Location: [place];  Service: Gastroenterology;  Laterality: N/A;  .  BIOPSY  07/26/2018   Procedure: BIOPSY;  Surgeon: Thornton Park, MD;  Location: [place];  Service: Gastroenterology;;  . BIOPSY  10/14/2018   Procedure: BIOPSY;  Surgeon: Lavena Bullion, DO;  Location: [place];  Service: Gastroenterology;;  . CARDIOVERSION N/A 04/17/2017   Procedure: CARDIOVERSION;  Surgeon: Jerline Pain, MD;  Location: [facility];  Service: Cardiovascular;  Laterality: N/A;  . CAROTID ENDARTERECTOMY Right 1996   CE  . COLONOSCOPY WITH PROPOFOL N/A 10/14/2018   Procedure: COLONOSCOPY WITH PROPOFOL;  Surgeon: Lavena Bullion, DO;  Location: [place];  Service: Gastroenterology;  Laterality: N/A;  . CORONARY ANGIOPLASTY WITH STENT PLACEMENT  01/24/11   "today makes a total of 9 stents"  . CORONARY ARTERY BYPASS GRAFT  1984    CABG X 4  . CORONARY ARTERY BYPASS GRAFT  1996   CABG X 4  . ENDARTERECTOMY FEMORAL Right 07/15/2019   Procedure: RIGHT FEMORAL ENDARTARECTOMY WITH PROFUNDOPLASTY;  Surgeon: Elam Dutch, MD;  Location: [place];  Service: Vascular;  Laterality: Right;  . ESOPHAGOGASTRODUODENOSCOPY (EGD) WITH PROPOFOL N/A 07/26/2018   Procedure: ESOPHAGOGASTRODUODENOSCOPY (EGD) WITH PROPOFOL;  Surgeon: Thornton Park, MD;  Location: [place];  Service: Gastroenterology;  Laterality: N/A;  . ESOPHAGOGASTRODUODENOSCOPY (EGD) WITH PROPOFOL N/A 10/14/2018   Procedure: ESOPHAGOGASTRODUODENOSCOPY (EGD) WITH PROPOFOL;  Surgeon: Lavena Bullion, DO;  Location: [place];  Service: Gastroenterology;  Laterality: N/A;  . EYE SURGERY  2009   detached retina in right eye  . FEMORAL-FEMORAL BYPASS GRAFT Bilateral 07/15/2019   Procedure: LEFT TO RIGHT FEMORAL-FEMORAL ARTERY;  Surgeon: Elam Dutch, MD;  Location: [place];  Service: Vascular;  Laterality: Bilateral;  . FRACTURE SURGERY  07/03/2018   fractured right hip sx.  . IR THORACENTESIS ASP PLEURAL SPACE W/IMG GUIDE  10/12/2018  . LEFT HEART CATHETERIZATION WITH CORONARY/GRAFT ANGIOGRAM N/A 01/24/2011   Procedure: LEFT HEART CATHETERIZATION WITH Beatrix Fetters;  Surgeon: Troy Sine, MD;  Location: [facility] CATH LAB;  Service: Cardiovascular;  Laterality: N/A;  . PERIPHERAL VASCULAR INTERVENTION  07/16/2018   Procedure: PERIPHERAL VASCULAR INTERVENTION;  Surgeon: Elam Dutch, MD;  Location: [place] [facility];  Service: Cardiovascular;;  SMA and Celiac  . pylondial cyst removal    . RETINAL DETACHMENT SURGERY  04/2007   right  . TONSILLECTOMY  1972  . VISCERAL ANGIOGRAPHY N/A 07/16/2018   Procedure: VISCERAL ANGIOGRAPHY;  Surgeon: Elam Dutch, MD;  Location: [place] [facility];  Service: Cardiovascular;  Laterality: N/A;  . WOUND DEBRIDEMENT Left 07/23/2019   Procedure: INCISION AND DRAINAGE LEFT GROIN WOUND;  Surgeon: Angelia Mould, MD;  Location: [facility] OR;  Service: Vascular;  Laterality: Left;    Social History   Socioeconomic History  . Marital status: Married    Spouse name: Not on file  . Number of children: Not on file  . Years of education: Not on file  . Highest education level: Not on file  Occupational History  . Not on file  Tobacco Use  . Smoking status: Former Smoker    Packs/day: 1.00    Years: 38.00    Pack years: 38.00    Types: Cigarettes    Quit date: 02/09/2005    Years since quitting: 15.4  . Smokeless tobacco: Never Used  . Tobacco comment: "quit smoking cigarrettes 2006"  Vaping Use  . Vaping Use: Never used  Substance and Sexual Activity  . Alcohol use: No    Alcohol/week: 0.0 standard drinks  . Drug use: No  .  Sexual activity: Not on file  Other Topics Concern  . Not on file  Social History Narrative  . Not on file   Social Determinants of Health   Financial Resource Strain: Not on file  Food Insecurity: Not on file  Transportation Needs: Not on file  Physical Activity: Not on file  Stress: Not on file  Social Connections: Not on file  Intimate Partner Violence: Not on file   Family History  Problem Relation Age of Onset  . Breast cancer Paternal Aunt   . Diabetes Sister   . Ulcers Father   . Heart disease Father   . Heart failure Father   . Heart attack Father   . AAA (abdominal aortic aneurysm) Father   . Stroke Mother   . Hypertension Mother   . Colon cancer Neg Hx     Current Outpatient Medications  Medication Sig Dispense Refill  . aspirin EC 81 MG tablet Take 1 tablet (81 mg total) by mouth daily. 150 tablet 2  . butalbital-aspirin-caffeine (FIORINAL) 50-325-40 MG capsule Take 1 capsule by mouth daily as needed for headache.     . diltiazem (CARDIZEM CD) 120 MG 24 hr capsule Take 1 capsule (120 mg total) by mouth daily. 90 capsule 3  . dutasteride (AVODART) 0.5 MG capsule Take 0.5 mg by mouth daily.     Marland Kitchen ezetimibe (ZETIA) 10 MG tablet TAKE  1 TABLET BY MOUTH EVERY DAY 90 tablet 2  . furosemide (LASIX) 40 MG tablet Take 80 mg by mouth 2 (two) times daily.    . Gabapentin Enacarbil (HORIZANT) 600 MG TBCR Take 600 mg by mouth daily as needed (restless leg). (1700)    . Ketotifen Fumarate (ALAWAY OP) Place 1 drop into the right eye daily. Uses before putting contact Lens in R eye    . loratadine (CLARITIN) 10 MG tablet Take 10 mg by mouth daily as needed for allergies, rhinitis or itching.   3  . lovastatin (MEVACOR) 40 MG tablet TAKE 1 TABLET BY MOUTH EVERYDAY AT BEDTIME 90 tablet 3  . metolazone (ZAROXOLYN) 2.5 MG tablet Take 2.5 mg by mouth daily as needed (fluid retention).     . metoprolol succinate (TOPROL XL) 25 MG 24 hr tablet Take 1 tablet (25 mg total) by mouth at bedtime. 90 tablet 3  . Multiple Vitamins-Minerals (MULTIVITAMINS THER. W/MINERALS) TABS Take 1 tablet by mouth every evening.    . nitroGLYCERIN (NITROLINGUAL) 0.4 MG/SPRAY spray PLACE 1 SPRAY UNDER THE TONGUE EVERY 5 (FIVE) MINUTES X 3 DOSES AS NEEDED FOR CHEST PAIN. 0.4 g 2  . Potassium Chloride ER 20 MEQ TBCR Take 20 mEq by mouth as needed (With Metolazone).    . ranolazine (RANEXA) 500 MG 12 hr tablet Take 500 mg by mouth 2 (two) times daily.    Marland Kitchen senna-docusate (SENOKOT-S) 8.6-50 MG tablet Take 1 tablet by mouth daily. 30 tablet 6  . vitamin E 200 UNIT capsule Take 200 Units by mouth every evening.    Marland Kitchen esomeprazole (NEXIUM) 40 MG capsule Take 1 capsule (40 mg total) by mouth 2 (two) times daily before a meal. 120 capsule 3  . sucralfate (CARAFATE) 1 GM/10ML suspension Take 10 mLs (1 g total) by mouth 4 (four) times daily -  with meals and at bedtime. 1200 mL 11   No current facility-administered medications for this visit.    Allergies  Allergen Reactions  . Codeine Nausea And Vomiting  . Sulfa Antibiotics Rash  . Sulfonamide Derivatives Rash  .  Oxycodone Nausea Only     REVIEW OF SYSTEMS:   [X]  denotes positive finding, [ ]  denotes negative  finding Cardiac  Comments:  Chest pain or chest pressure:    Shortness of breath upon exertion:    Short of breath when lying flat:    Irregular heart rhythm:        Vascular    Pain in calf, thigh, or hip brought on by ambulation:    Pain in feet at night that wakes you up from your sleep:     Blood clot in your veins:    Leg swelling:         Pulmonary    Oxygen at home:    Productive cough:     Wheezing:         Neurologic    Sudden weakness in arms or legs:     Sudden numbness in arms or legs:     Sudden onset of difficulty speaking or slurred speech:    Temporary loss of vision in one eye:     Problems with dizziness:         Gastrointestinal    Blood in stool:     Vomited blood:         Genitourinary    Burning when urinating:     Blood in urine:        Psychiatric    Major depression:         Hematologic    Bleeding problems:    Problems with blood clotting too easily:        Skin    Rashes or ulcers:        Constitutional    Fever or chills:      PHYSICAL EXAMINATION:  Vitals:   07/02/20 1401  BP: 134/62  Pulse: 78  Resp: 20  Temp: 97.8 F (36.6 C)  TempSrc: Temporal  SpO2: 97%  Weight: 138 lb 6.4 oz (62.8 kg)  Height: 5\' 5"  (1.651 m)    General:  WDWN in NAD; vital signs documented above Gait: Slow but steady with rolling walker HENT: WNL, normocephalic Pulmonary: normal non-labored breathing , without Rales, rhonchi,  wheezing Cardiac: irregular HR,  Abdomen: soft, NT, no masses Skin: with rashes Vascular Exam/Pulses: 2+ brachial, radial, femoral artery pulses.  1+ bypass pulse. He has dampened bilateral peroneal Doppler signals.  I am able to obtain a left dorsalis pedis and a right anterior tibial Doppler signal.  I am unable to obtain posterior tibial signals. Extremities: without acute ischemic changes, without Gangrene , without cellulitis; without open wounds; he has evidence of venous insufficiency of both lower extremities.   Left foot edema is more prominent.  Small area of scale of the left lateral leg and right anterior lower leg. Musculoskeletal: no muscle wasting or atrophy.    Neurologic: A&O X 3;  No focal weakness or paresthesias are detected Psychiatric:  The pt has Normal affect.       Non-Invasive Vascular Imaging:   07/02/2020 Patent left to right femorofemoral bypass graft with inflow velocities  suggestive of a >50% stenosis.   ABIs ABI/TBI Today's ABI Today's TBI Previous ABI Previous TBI Right 0.43 absent 0.40 0.12 Left 0.60 absent 0.63 0.22 Toe pressures are 0 bilaterally   ASSESSMENT/PLAN:: 75 y.o. male here for follow up for peripheral arterial disease.  In addition, he has chronic venous insufficiency of both lower extremities.  He has no skin issues to suggest arterial ischemia.  His ABIs are essentially  unchanged.  He is able to ambulate with a rolling walker and has no rest pain.  His femoral-femoral bypass is patent.  Chronic lower extremity edema: Recommend compression knee-high stocking to left lower extremity.  I advised the patient and his wife wife not to use compression therapy on the right.  Also gave him written material regarding proper position for lower extremity elevation.  Recommend doing this for 15 to 20 minutes daily.  I reviewed the patient's duplex examination with Dr. Oneida Alar as well as his physical exam findings.  We will not plan to repeat duplex of the femoral to femoral bypass.  We will get ABIs when he returns in January for mesenteric duplex.   Barbie Banner, PA-C [facility] [phone]  Clinic MD:   Dr. Oneida Alar

## 2020-07-06 DIAGNOSIS — M62552 Muscle wasting and atrophy, not elsewhere classified, left thigh: Secondary | ICD-10-CM | POA: Diagnosis not present

## 2020-07-06 DIAGNOSIS — M62551 Muscle wasting and atrophy, not elsewhere classified, right thigh: Secondary | ICD-10-CM | POA: Diagnosis not present

## 2020-07-06 DIAGNOSIS — M545 Low back pain, unspecified: Secondary | ICD-10-CM | POA: Diagnosis not present

## 2020-07-06 DIAGNOSIS — R2689 Other abnormalities of gait and mobility: Secondary | ICD-10-CM | POA: Diagnosis not present

## 2020-07-06 DIAGNOSIS — M79661 Pain in right lower leg: Secondary | ICD-10-CM | POA: Diagnosis not present

## 2020-07-09 DIAGNOSIS — Z872 Personal history of diseases of the skin and subcutaneous tissue: Secondary | ICD-10-CM | POA: Insufficient documentation

## 2020-07-09 DIAGNOSIS — M79661 Pain in right lower leg: Secondary | ICD-10-CM | POA: Diagnosis not present

## 2020-07-09 DIAGNOSIS — Z8631 Personal history of diabetic foot ulcer: Secondary | ICD-10-CM | POA: Diagnosis not present

## 2020-07-09 DIAGNOSIS — R600 Localized edema: Secondary | ICD-10-CM | POA: Diagnosis not present

## 2020-07-09 DIAGNOSIS — M62552 Muscle wasting and atrophy, not elsewhere classified, left thigh: Secondary | ICD-10-CM | POA: Diagnosis not present

## 2020-07-09 DIAGNOSIS — I739 Peripheral vascular disease, unspecified: Secondary | ICD-10-CM | POA: Diagnosis not present

## 2020-07-09 DIAGNOSIS — B351 Tinea unguium: Secondary | ICD-10-CM | POA: Diagnosis not present

## 2020-07-09 DIAGNOSIS — M62551 Muscle wasting and atrophy, not elsewhere classified, right thigh: Secondary | ICD-10-CM | POA: Diagnosis not present

## 2020-07-09 DIAGNOSIS — E119 Type 2 diabetes mellitus without complications: Secondary | ICD-10-CM | POA: Diagnosis not present

## 2020-07-09 DIAGNOSIS — R2689 Other abnormalities of gait and mobility: Secondary | ICD-10-CM | POA: Diagnosis not present

## 2020-07-09 DIAGNOSIS — M545 Low back pain, unspecified: Secondary | ICD-10-CM | POA: Diagnosis not present

## 2020-07-13 DIAGNOSIS — M545 Low back pain, unspecified: Secondary | ICD-10-CM | POA: Diagnosis not present

## 2020-07-13 DIAGNOSIS — M62552 Muscle wasting and atrophy, not elsewhere classified, left thigh: Secondary | ICD-10-CM | POA: Diagnosis not present

## 2020-07-13 DIAGNOSIS — I480 Paroxysmal atrial fibrillation: Secondary | ICD-10-CM | POA: Diagnosis not present

## 2020-07-13 DIAGNOSIS — M79661 Pain in right lower leg: Secondary | ICD-10-CM | POA: Diagnosis not present

## 2020-07-13 DIAGNOSIS — N1831 Chronic kidney disease, stage 3a: Secondary | ICD-10-CM | POA: Diagnosis not present

## 2020-07-13 DIAGNOSIS — R2689 Other abnormalities of gait and mobility: Secondary | ICD-10-CM | POA: Diagnosis not present

## 2020-07-13 DIAGNOSIS — M62551 Muscle wasting and atrophy, not elsewhere classified, right thigh: Secondary | ICD-10-CM | POA: Diagnosis not present

## 2020-07-13 DIAGNOSIS — D500 Iron deficiency anemia secondary to blood loss (chronic): Secondary | ICD-10-CM | POA: Diagnosis not present

## 2020-07-13 DIAGNOSIS — I739 Peripheral vascular disease, unspecified: Secondary | ICD-10-CM | POA: Diagnosis not present

## 2020-07-13 DIAGNOSIS — G8929 Other chronic pain: Secondary | ICD-10-CM | POA: Diagnosis not present

## 2020-07-23 DIAGNOSIS — M545 Low back pain, unspecified: Secondary | ICD-10-CM | POA: Diagnosis not present

## 2020-07-29 DIAGNOSIS — I5020 Unspecified systolic (congestive) heart failure: Secondary | ICD-10-CM | POA: Diagnosis not present

## 2020-08-28 DIAGNOSIS — U071 COVID-19: Secondary | ICD-10-CM | POA: Diagnosis not present

## 2020-08-29 DIAGNOSIS — I5020 Unspecified systolic (congestive) heart failure: Secondary | ICD-10-CM | POA: Diagnosis not present

## 2020-09-03 DIAGNOSIS — M5137 Other intervertebral disc degeneration, lumbosacral region: Secondary | ICD-10-CM | POA: Diagnosis not present

## 2020-09-03 DIAGNOSIS — M549 Dorsalgia, unspecified: Secondary | ICD-10-CM | POA: Diagnosis not present

## 2020-09-03 DIAGNOSIS — M47896 Other spondylosis, lumbar region: Secondary | ICD-10-CM | POA: Diagnosis not present

## 2020-09-03 DIAGNOSIS — Z8781 Personal history of (healed) traumatic fracture: Secondary | ICD-10-CM | POA: Diagnosis not present

## 2020-09-03 DIAGNOSIS — G894 Chronic pain syndrome: Secondary | ICD-10-CM | POA: Diagnosis not present

## 2020-09-03 DIAGNOSIS — Z1389 Encounter for screening for other disorder: Secondary | ICD-10-CM | POA: Diagnosis not present

## 2020-09-04 DIAGNOSIS — Z8781 Personal history of (healed) traumatic fracture: Secondary | ICD-10-CM | POA: Diagnosis not present

## 2020-09-04 DIAGNOSIS — M5137 Other intervertebral disc degeneration, lumbosacral region: Secondary | ICD-10-CM | POA: Diagnosis not present

## 2020-09-04 DIAGNOSIS — M549 Dorsalgia, unspecified: Secondary | ICD-10-CM | POA: Diagnosis not present

## 2020-09-04 DIAGNOSIS — M47896 Other spondylosis, lumbar region: Secondary | ICD-10-CM | POA: Diagnosis not present

## 2020-09-16 DIAGNOSIS — Z8781 Personal history of (healed) traumatic fracture: Secondary | ICD-10-CM | POA: Diagnosis not present

## 2020-09-16 DIAGNOSIS — M47896 Other spondylosis, lumbar region: Secondary | ICD-10-CM | POA: Diagnosis not present

## 2020-09-16 DIAGNOSIS — M5137 Other intervertebral disc degeneration, lumbosacral region: Secondary | ICD-10-CM | POA: Diagnosis not present

## 2020-09-16 DIAGNOSIS — Z1389 Encounter for screening for other disorder: Secondary | ICD-10-CM | POA: Diagnosis not present

## 2020-09-16 DIAGNOSIS — M549 Dorsalgia, unspecified: Secondary | ICD-10-CM | POA: Diagnosis not present

## 2020-09-16 DIAGNOSIS — G894 Chronic pain syndrome: Secondary | ICD-10-CM | POA: Diagnosis not present

## 2020-09-28 DIAGNOSIS — I5020 Unspecified systolic (congestive) heart failure: Secondary | ICD-10-CM | POA: Diagnosis not present

## 2020-10-06 ENCOUNTER — Other Ambulatory Visit: Payer: Self-pay | Admitting: Internal Medicine

## 2020-10-09 ENCOUNTER — Other Ambulatory Visit: Payer: Self-pay | Admitting: Cardiovascular Disease

## 2020-10-09 DIAGNOSIS — Z8781 Personal history of (healed) traumatic fracture: Secondary | ICD-10-CM | POA: Diagnosis not present

## 2020-10-09 DIAGNOSIS — M549 Dorsalgia, unspecified: Secondary | ICD-10-CM | POA: Diagnosis not present

## 2020-10-09 DIAGNOSIS — M5137 Other intervertebral disc degeneration, lumbosacral region: Secondary | ICD-10-CM | POA: Diagnosis not present

## 2020-10-09 DIAGNOSIS — M47896 Other spondylosis, lumbar region: Secondary | ICD-10-CM | POA: Diagnosis not present

## 2020-10-13 ENCOUNTER — Telehealth: Payer: Self-pay | Admitting: Gastroenterology

## 2020-10-13 NOTE — Telephone Encounter (Signed)
Patient called requesting a refill on Nexium states he is disabled and would like to know if he can have a phone call visit.

## 2020-10-14 DIAGNOSIS — M47816 Spondylosis without myelopathy or radiculopathy, lumbar region: Secondary | ICD-10-CM | POA: Diagnosis not present

## 2020-10-14 MED ORDER — ESOMEPRAZOLE MAGNESIUM 40 MG PO CPDR: 40.0000 mg | DELAYED_RELEASE_CAPSULE | Freq: Two times a day (BID) | ORAL | 3 refills | Status: DC

## 2020-10-14 NOTE — Telephone Encounter (Signed)
LVM patient can do mychart video for an appointment. He will need to have access to [employer]  Medication sent

## 2020-10-14 NOTE — Telephone Encounter (Signed)
Patient daughter, states she will try to set mychart to get an appointment schedule.

## 2020-10-14 NOTE — Progress Notes (Signed)
[facility]  [address] [place],  [place]  [address] [phone]  Clinic Day:  10/21/2020  Referring physician: Garwin Brothers, MD  This document serves as a record of services personally performed by Dequincy Macarthur Critchley, MD. It was created on their behalf by [facility], a trained medical scribe. The creation of this record is based on the scribe's personal observations and the provider's statements to them.  HISTORY OF PRESENT ILLNESS:  The patient is a 75 y.o. male with anemia secondary to renal insufficiency.  Furthermore, recent labs also showed him to be iron deficient.  In the past, IV Feraheme was effective in bolstering his iron stores.  He comes in today to reassess his iron and hemoglobin levels.  Overall, the patient claims to be doing fairly well.  He denies having increased fatigue or any overt forms of blood loss which concern him for a declining hemoglobin.  PHYSICAL EXAM:  There were no vitals taken for this visit. Wt Readings from Last 3 Encounters:  07/02/20 138 lb 6.4 oz (62.8 kg)  05/25/20 137 lb 12.8 oz (62.5 kg)  04/23/20 137 lb 9.6 oz (62.4 kg)   There is no height or weight on file to calculate BMI. Performance status (ECOG): 1 - Symptomatic but completely ambulatory Physical Exam Constitutional:      Appearance: Normal appearance. He is not ill-appearing.  HENT:     Mouth/Throat:     Mouth: Mucous membranes are moist.     Pharynx: Oropharynx is clear. No oropharyngeal exudate or posterior oropharyngeal erythema.  Cardiovascular:     Rate and Rhythm: Normal rate and regular rhythm.     Heart sounds: No murmur heard.   No friction rub. No gallop.  Pulmonary:     Effort: Pulmonary effort is normal. No respiratory distress.     Breath sounds: Normal breath sounds. No wheezing, rhonchi or rales.  Abdominal:     General: Bowel sounds are normal. There is no distension.     Palpations: Abdomen is soft. There is no mass.      Tenderness: There is no abdominal tenderness.  Musculoskeletal:        General: No swelling.     Right lower leg: No edema.     Left lower leg: No edema.  Lymphadenopathy:     Cervical: No cervical adenopathy.     Upper Body:     Right upper body: No supraclavicular or axillary adenopathy.     Left upper body: No supraclavicular or axillary adenopathy.     Lower Body: No right inguinal adenopathy. No left inguinal adenopathy.  Skin:    General: Skin is warm.     Coloration: Skin is not jaundiced.     Findings: No lesion or rash.  Neurological:     General: No focal deficit present.     Mental Status: He is alert and oriented to person, place, and time. Mental status is at baseline.     Cranial Nerves: Cranial nerves are intact.  Psychiatric:        Mood and Affect: Mood normal.        Behavior: Behavior normal.        Thought Content: Thought content normal.    LABS:   CBC Latest Ref Rng & Units 10/21/2020 04/23/2020 01/29/2020  WBC - 6.6 7.5 6.4  Hemoglobin 13.5 - 17.5 13.1(A) 12.4(A) 11.9(L)  Hematocrit 41 - 53 38(A) 36(A) 33.5(L)  Platelets 150 - 399 172 158 157  CMP Latest Ref Rng & Units 10/21/2020 04/23/2020 01/29/2020  Glucose 65 - 99 mg/dL - - 88  BUN 4 - 21 31(A) 28(A) 27  Creatinine 0.6 - 1.3 2.1(A) 1.9(A) 1.66(H)  Sodium 137 - 147 140 140 143  Potassium 3.4 - 5.3 3.9 4.1 4.2  Chloride 99 - 108 102 102 102  CO2 13 - 22 28(A) 29(A) 25  Calcium 8.7 - 10.7 9.0 9.0 8.7  Total Protein 6.0 - 8.5 g/dL - - 6.5  Total Bilirubin 0.0 - 1.2 mg/dL - - <0.2  Alkaline Phos 25 - 125 115 146(A) 151(H)  AST 14 - 40 33 24 23  ALT 10 - 40 13 14 12     Ref. Range 10/21/2020 09:17 10/21/2020 10:19  Iron Latest Ref Range: 45 - 182 ug/dL 116   UIBC Latest Units: ug/dL 163   TIBC Latest Ref Range: 250 - 450 ug/dL 279   Saturation Ratios Latest Ref Range: 17.9 - 39.5 % 42 (H)   Ferritin Latest Ref Range: 24 - 336 ng/mL 77   Vitamin B12 Latest Ref Range: 180 - 914 pg/mL  516    ASSESSMENT & PLAN:  Assessment/Plan:  A 75 y.o. male with anemia secondary to chronic renal insufficiency and iron deficiency.  I am pleased as his hemoglobin is even better at 13.1 today.  Furthermore, his iron parameters remain fine.  Clinically, the patient is doing well.  As that is the case, I will see him back in 6 months for repeat clinical assessment. The patient understands all the plans discussed today and is in agreement with them.     I, Rita Ohara, am acting as scribe for Marice Potter, MD    I have reviewed this report as typed by the medical scribe, and it is complete and accurate.  Dequincy Macarthur Critchley, MD

## 2020-10-15 DIAGNOSIS — G894 Chronic pain syndrome: Secondary | ICD-10-CM | POA: Diagnosis not present

## 2020-10-15 DIAGNOSIS — Z8781 Personal history of (healed) traumatic fracture: Secondary | ICD-10-CM | POA: Diagnosis not present

## 2020-10-15 DIAGNOSIS — M47896 Other spondylosis, lumbar region: Secondary | ICD-10-CM | POA: Diagnosis not present

## 2020-10-15 DIAGNOSIS — M549 Dorsalgia, unspecified: Secondary | ICD-10-CM | POA: Diagnosis not present

## 2020-10-15 DIAGNOSIS — M5137 Other intervertebral disc degeneration, lumbosacral region: Secondary | ICD-10-CM | POA: Diagnosis not present

## 2020-10-15 DIAGNOSIS — Z1389 Encounter for screening for other disorder: Secondary | ICD-10-CM | POA: Diagnosis not present

## 2020-10-19 DIAGNOSIS — G2581 Restless legs syndrome: Secondary | ICD-10-CM | POA: Diagnosis not present

## 2020-10-19 DIAGNOSIS — E785 Hyperlipidemia, unspecified: Secondary | ICD-10-CM | POA: Diagnosis not present

## 2020-10-19 DIAGNOSIS — I4821 Permanent atrial fibrillation: Secondary | ICD-10-CM | POA: Diagnosis not present

## 2020-10-19 DIAGNOSIS — I25708 Atherosclerosis of coronary artery bypass graft(s), unspecified, with other forms of angina pectoris: Secondary | ICD-10-CM | POA: Diagnosis not present

## 2020-10-19 DIAGNOSIS — N1831 Chronic kidney disease, stage 3a: Secondary | ICD-10-CM | POA: Diagnosis not present

## 2020-10-21 ENCOUNTER — Inpatient Hospital Stay: Payer: Medicare PPO

## 2020-10-21 ENCOUNTER — Encounter: Payer: Self-pay | Admitting: Oncology

## 2020-10-21 ENCOUNTER — Inpatient Hospital Stay: Payer: Medicare PPO | Attending: Oncology | Admitting: Oncology

## 2020-10-21 ENCOUNTER — Other Ambulatory Visit: Payer: Self-pay | Admitting: Oncology

## 2020-10-21 ENCOUNTER — Other Ambulatory Visit: Payer: Self-pay

## 2020-10-21 ENCOUNTER — Telehealth: Payer: Self-pay | Admitting: Oncology

## 2020-10-21 VITALS — BP 124/61 | HR 64 | Temp 98.0°F | Resp 16 | Ht 65.0 in | Wt 133.6 lb

## 2020-10-21 DIAGNOSIS — D631 Anemia in chronic kidney disease: Secondary | ICD-10-CM

## 2020-10-21 DIAGNOSIS — N189 Chronic kidney disease, unspecified: Secondary | ICD-10-CM

## 2020-10-21 DIAGNOSIS — D509 Iron deficiency anemia, unspecified: Secondary | ICD-10-CM | POA: Diagnosis not present

## 2020-10-21 DIAGNOSIS — D649 Anemia, unspecified: Secondary | ICD-10-CM | POA: Diagnosis not present

## 2020-10-21 DIAGNOSIS — E611 Iron deficiency: Secondary | ICD-10-CM | POA: Diagnosis not present

## 2020-10-21 DIAGNOSIS — D508 Other iron deficiency anemias: Secondary | ICD-10-CM

## 2020-10-21 LAB — CBC AND DIFFERENTIAL
HCT: 38 — AB (ref 41–53)
Hemoglobin: 13.1 — AB (ref 13.5–17.5)
Neutrophils Absolute: 4.36
Platelets: 172 (ref 150–399)
WBC: 6.6

## 2020-10-21 LAB — IRON AND TIBC
Iron: 116 ug/dL (ref 45–182)
Saturation Ratios: 42 % — ABNORMAL HIGH (ref 17.9–39.5)
TIBC: 279 ug/dL (ref 250–450)
UIBC: 163 ug/dL

## 2020-10-21 LAB — CBC: RBC: 3.69 — AB (ref 3.87–5.11)

## 2020-10-21 LAB — HEPATIC FUNCTION PANEL
ALT: 13 (ref 10–40)
AST: 33 (ref 14–40)
Alkaline Phosphatase: 115 (ref 25–125)
Bilirubin, Total: 0.4

## 2020-10-21 LAB — BASIC METABOLIC PANEL
BUN: 31 — AB (ref 4–21)
CO2: 28 — AB (ref 13–22)
Chloride: 102 (ref 99–108)
Creatinine: 2.1 — AB (ref 0.6–1.3)
Glucose: 104
Potassium: 3.9 (ref 3.4–5.3)
Sodium: 140 (ref 137–147)

## 2020-10-21 LAB — COMPREHENSIVE METABOLIC PANEL
Albumin: 4.1 (ref 3.5–5.0)
Calcium: 9 (ref 8.7–10.7)

## 2020-10-21 LAB — VITAMIN B12: Vitamin B-12: 516 pg/mL (ref 180–914)

## 2020-10-21 LAB — FOLATE: Folate: 35.9 ng/mL (ref >5.9)

## 2020-10-21 LAB — FERRITIN: Ferritin: 77 ng/mL (ref 24–336)

## 2020-10-21 NOTE — Telephone Encounter (Signed)
Per 8/17 LOS, patient scheduled for Feb Appt's.  Gave patient Appt Summary

## 2020-10-22 DIAGNOSIS — I739 Peripheral vascular disease, unspecified: Secondary | ICD-10-CM | POA: Diagnosis not present

## 2020-10-22 DIAGNOSIS — R600 Localized edema: Secondary | ICD-10-CM | POA: Diagnosis not present

## 2020-10-22 DIAGNOSIS — B351 Tinea unguium: Secondary | ICD-10-CM | POA: Diagnosis not present

## 2020-10-22 DIAGNOSIS — Z9862 Peripheral vascular angioplasty status: Secondary | ICD-10-CM | POA: Diagnosis not present

## 2020-10-29 DIAGNOSIS — I5020 Unspecified systolic (congestive) heart failure: Secondary | ICD-10-CM | POA: Diagnosis not present

## 2020-11-09 DIAGNOSIS — M5137 Other intervertebral disc degeneration, lumbosacral region: Secondary | ICD-10-CM | POA: Diagnosis not present

## 2020-11-09 DIAGNOSIS — Z8781 Personal history of (healed) traumatic fracture: Secondary | ICD-10-CM | POA: Diagnosis not present

## 2020-11-09 DIAGNOSIS — M549 Dorsalgia, unspecified: Secondary | ICD-10-CM | POA: Diagnosis not present

## 2020-11-09 DIAGNOSIS — M47896 Other spondylosis, lumbar region: Secondary | ICD-10-CM | POA: Diagnosis not present

## 2020-11-11 DIAGNOSIS — M47816 Spondylosis without myelopathy or radiculopathy, lumbar region: Secondary | ICD-10-CM | POA: Diagnosis not present

## 2020-11-19 DIAGNOSIS — I25119 Atherosclerotic heart disease of native coronary artery with unspecified angina pectoris: Secondary | ICD-10-CM | POA: Diagnosis not present

## 2020-11-19 DIAGNOSIS — M47896 Other spondylosis, lumbar region: Secondary | ICD-10-CM | POA: Diagnosis not present

## 2020-11-19 DIAGNOSIS — I4891 Unspecified atrial fibrillation: Secondary | ICD-10-CM | POA: Diagnosis not present

## 2020-11-19 DIAGNOSIS — I509 Heart failure, unspecified: Secondary | ICD-10-CM | POA: Diagnosis not present

## 2020-11-19 DIAGNOSIS — M549 Dorsalgia, unspecified: Secondary | ICD-10-CM | POA: Diagnosis not present

## 2020-11-19 DIAGNOSIS — I7092 Chronic total occlusion of artery of the extremities: Secondary | ICD-10-CM | POA: Diagnosis not present

## 2020-11-19 DIAGNOSIS — J449 Chronic obstructive pulmonary disease, unspecified: Secondary | ICD-10-CM | POA: Diagnosis not present

## 2020-11-19 DIAGNOSIS — Z1389 Encounter for screening for other disorder: Secondary | ICD-10-CM | POA: Diagnosis not present

## 2020-11-19 DIAGNOSIS — Z8249 Family history of ischemic heart disease and other diseases of the circulatory system: Secondary | ICD-10-CM | POA: Diagnosis not present

## 2020-11-19 DIAGNOSIS — I130 Hypertensive heart and chronic kidney disease with heart failure and stage 1 through stage 4 chronic kidney disease, or unspecified chronic kidney disease: Secondary | ICD-10-CM | POA: Diagnosis not present

## 2020-11-19 DIAGNOSIS — I701 Atherosclerosis of renal artery: Secondary | ICD-10-CM | POA: Diagnosis not present

## 2020-11-19 DIAGNOSIS — M5137 Other intervertebral disc degeneration, lumbosacral region: Secondary | ICD-10-CM | POA: Diagnosis not present

## 2020-11-19 DIAGNOSIS — G894 Chronic pain syndrome: Secondary | ICD-10-CM | POA: Diagnosis not present

## 2020-11-19 DIAGNOSIS — Z7409 Other reduced mobility: Secondary | ICD-10-CM | POA: Diagnosis not present

## 2020-11-19 DIAGNOSIS — Z7982 Long term (current) use of aspirin: Secondary | ICD-10-CM | POA: Diagnosis not present

## 2020-11-19 DIAGNOSIS — Z8781 Personal history of (healed) traumatic fracture: Secondary | ICD-10-CM | POA: Diagnosis not present

## 2020-11-19 DIAGNOSIS — K551 Chronic vascular disorders of intestine: Secondary | ICD-10-CM | POA: Diagnosis not present

## 2020-11-19 DIAGNOSIS — I70209 Unspecified atherosclerosis of native arteries of extremities, unspecified extremity: Secondary | ICD-10-CM | POA: Diagnosis not present

## 2020-11-23 ENCOUNTER — Other Ambulatory Visit: Payer: Self-pay | Admitting: Cardiovascular Disease

## 2020-11-29 DIAGNOSIS — I5020 Unspecified systolic (congestive) heart failure: Secondary | ICD-10-CM | POA: Diagnosis not present

## 2020-12-08 DIAGNOSIS — M47816 Spondylosis without myelopathy or radiculopathy, lumbar region: Secondary | ICD-10-CM | POA: Diagnosis not present

## 2020-12-09 DIAGNOSIS — M5137 Other intervertebral disc degeneration, lumbosacral region: Secondary | ICD-10-CM | POA: Diagnosis not present

## 2020-12-09 DIAGNOSIS — Z8781 Personal history of (healed) traumatic fracture: Secondary | ICD-10-CM | POA: Diagnosis not present

## 2020-12-09 DIAGNOSIS — M47896 Other spondylosis, lumbar region: Secondary | ICD-10-CM | POA: Diagnosis not present

## 2020-12-09 DIAGNOSIS — M549 Dorsalgia, unspecified: Secondary | ICD-10-CM | POA: Diagnosis not present

## 2020-12-16 ENCOUNTER — Other Ambulatory Visit (HOSPITAL_COMMUNITY): Payer: Self-pay | Admitting: Cardiovascular Disease

## 2020-12-16 DIAGNOSIS — I6523 Occlusion and stenosis of bilateral carotid arteries: Secondary | ICD-10-CM

## 2020-12-16 DIAGNOSIS — Z9889 Other specified postprocedural states: Secondary | ICD-10-CM

## 2020-12-16 DIAGNOSIS — I701 Atherosclerosis of renal artery: Secondary | ICD-10-CM

## 2020-12-19 DIAGNOSIS — Z8781 Personal history of (healed) traumatic fracture: Secondary | ICD-10-CM | POA: Diagnosis not present

## 2020-12-19 DIAGNOSIS — M47896 Other spondylosis, lumbar region: Secondary | ICD-10-CM | POA: Diagnosis not present

## 2020-12-19 DIAGNOSIS — M549 Dorsalgia, unspecified: Secondary | ICD-10-CM | POA: Diagnosis not present

## 2020-12-19 DIAGNOSIS — M5137 Other intervertebral disc degeneration, lumbosacral region: Secondary | ICD-10-CM | POA: Diagnosis not present

## 2020-12-24 ENCOUNTER — Ambulatory Visit (HOSPITAL_COMMUNITY): Payer: Medicare PPO

## 2020-12-29 DIAGNOSIS — I5020 Unspecified systolic (congestive) heart failure: Secondary | ICD-10-CM | POA: Diagnosis not present

## 2021-01-07 DIAGNOSIS — M5137 Other intervertebral disc degeneration, lumbosacral region: Secondary | ICD-10-CM | POA: Diagnosis not present

## 2021-01-07 DIAGNOSIS — Z1389 Encounter for screening for other disorder: Secondary | ICD-10-CM | POA: Diagnosis not present

## 2021-01-07 DIAGNOSIS — M47896 Other spondylosis, lumbar region: Secondary | ICD-10-CM | POA: Diagnosis not present

## 2021-01-07 DIAGNOSIS — G894 Chronic pain syndrome: Secondary | ICD-10-CM | POA: Diagnosis not present

## 2021-01-07 DIAGNOSIS — M549 Dorsalgia, unspecified: Secondary | ICD-10-CM | POA: Diagnosis not present

## 2021-01-07 DIAGNOSIS — Z8781 Personal history of (healed) traumatic fracture: Secondary | ICD-10-CM | POA: Diagnosis not present

## 2021-01-09 DIAGNOSIS — M47896 Other spondylosis, lumbar region: Secondary | ICD-10-CM | POA: Diagnosis not present

## 2021-01-09 DIAGNOSIS — M549 Dorsalgia, unspecified: Secondary | ICD-10-CM | POA: Diagnosis not present

## 2021-01-09 DIAGNOSIS — M5137 Other intervertebral disc degeneration, lumbosacral region: Secondary | ICD-10-CM | POA: Diagnosis not present

## 2021-01-09 DIAGNOSIS — Z8781 Personal history of (healed) traumatic fracture: Secondary | ICD-10-CM | POA: Diagnosis not present

## 2021-01-14 ENCOUNTER — Ambulatory Visit (HOSPITAL_BASED_OUTPATIENT_CLINIC_OR_DEPARTMENT_OTHER)
Admission: RE | Admit: 2021-01-14 | Discharge: 2021-01-14 | Disposition: A | Discharge status: Home / Self Care | Payer: Medicare PPO | Source: Ambulatory Visit | Attending: Cardiovascular Disease | Admitting: Cardiovascular Disease

## 2021-01-14 ENCOUNTER — Other Ambulatory Visit: Payer: Self-pay

## 2021-01-14 ENCOUNTER — Ambulatory Visit (HOSPITAL_COMMUNITY)
Admission: RE | Admit: 2021-01-14 | Discharge: 2021-01-14 | Disposition: A | Discharge status: Home / Self Care | Payer: Medicare PPO | Source: Ambulatory Visit | Attending: Cardiovascular Disease | Admitting: Cardiovascular Disease

## 2021-01-14 DIAGNOSIS — I701 Atherosclerosis of renal artery: Secondary | ICD-10-CM

## 2021-01-14 DIAGNOSIS — I6523 Occlusion and stenosis of bilateral carotid arteries: Secondary | ICD-10-CM

## 2021-01-14 DIAGNOSIS — Z9889 Other specified postprocedural states: Secondary | ICD-10-CM | POA: Insufficient documentation

## 2021-01-19 DIAGNOSIS — M5137 Other intervertebral disc degeneration, lumbosacral region: Secondary | ICD-10-CM | POA: Diagnosis not present

## 2021-01-19 DIAGNOSIS — Z8781 Personal history of (healed) traumatic fracture: Secondary | ICD-10-CM | POA: Diagnosis not present

## 2021-01-19 DIAGNOSIS — M549 Dorsalgia, unspecified: Secondary | ICD-10-CM | POA: Diagnosis not present

## 2021-01-19 DIAGNOSIS — M47896 Other spondylosis, lumbar region: Secondary | ICD-10-CM | POA: Diagnosis not present

## 2021-01-20 ENCOUNTER — Telehealth: Payer: Self-pay

## 2021-01-20 NOTE — Telephone Encounter (Signed)
Erroneous encounter

## 2021-01-25 ENCOUNTER — Other Ambulatory Visit: Payer: Self-pay

## 2021-01-25 DIAGNOSIS — I739 Peripheral vascular disease, unspecified: Secondary | ICD-10-CM

## 2021-01-25 DIAGNOSIS — K551 Chronic vascular disorders of intestine: Secondary | ICD-10-CM

## 2021-02-01 DIAGNOSIS — E782 Mixed hyperlipidemia: Secondary | ICD-10-CM | POA: Diagnosis not present

## 2021-02-01 DIAGNOSIS — Z23 Encounter for immunization: Secondary | ICD-10-CM | POA: Diagnosis not present

## 2021-02-01 DIAGNOSIS — G2581 Restless legs syndrome: Secondary | ICD-10-CM | POA: Diagnosis not present

## 2021-02-01 DIAGNOSIS — I25708 Atherosclerosis of coronary artery bypass graft(s), unspecified, with other forms of angina pectoris: Secondary | ICD-10-CM | POA: Diagnosis not present

## 2021-02-01 DIAGNOSIS — N1831 Chronic kidney disease, stage 3a: Secondary | ICD-10-CM | POA: Diagnosis not present

## 2021-02-01 DIAGNOSIS — E785 Hyperlipidemia, unspecified: Secondary | ICD-10-CM | POA: Diagnosis not present

## 2021-02-01 DIAGNOSIS — I4821 Permanent atrial fibrillation: Secondary | ICD-10-CM | POA: Diagnosis not present

## 2021-02-05 ENCOUNTER — Telehealth: Payer: Self-pay | Admitting: Cardiovascular Disease

## 2021-02-05 NOTE — Telephone Encounter (Signed)
Patient has upcoming appt on 12/7 in the office, he would like to switch it to a telephone visit if possible.

## 2021-02-05 NOTE — Telephone Encounter (Signed)
Informed pt that Dr. Gwenlyn Found no longer does video/virtual visits. Pt is ok to keep his appointment.

## 2021-02-08 DIAGNOSIS — M47896 Other spondylosis, lumbar region: Secondary | ICD-10-CM | POA: Diagnosis not present

## 2021-02-08 DIAGNOSIS — M549 Dorsalgia, unspecified: Secondary | ICD-10-CM | POA: Diagnosis not present

## 2021-02-08 DIAGNOSIS — M5137 Other intervertebral disc degeneration, lumbosacral region: Secondary | ICD-10-CM | POA: Diagnosis not present

## 2021-02-08 DIAGNOSIS — Z8781 Personal history of (healed) traumatic fracture: Secondary | ICD-10-CM | POA: Diagnosis not present

## 2021-02-10 ENCOUNTER — Other Ambulatory Visit: Payer: Self-pay

## 2021-02-10 ENCOUNTER — Ambulatory Visit: Payer: Medicare PPO | Admitting: Cardiovascular Disease

## 2021-02-10 ENCOUNTER — Encounter: Payer: Self-pay | Admitting: Cardiovascular Disease

## 2021-02-10 VITALS — BP 135/66 | HR 84 | Ht 65.0 in | Wt 130.8 lb

## 2021-02-10 DIAGNOSIS — I771 Stricture of artery: Secondary | ICD-10-CM

## 2021-02-10 DIAGNOSIS — I4821 Permanent atrial fibrillation: Secondary | ICD-10-CM | POA: Diagnosis not present

## 2021-02-10 DIAGNOSIS — I701 Atherosclerosis of renal artery: Secondary | ICD-10-CM | POA: Diagnosis not present

## 2021-02-10 NOTE — Patient Instructions (Signed)

## 2021-02-10 NOTE — Assessment & Plan Note (Signed)
History of remote right renal artery intervention by Dr. Rollene Fare and myself with 3 intervention for in-stent restenosis.  We have been following his Doppler studies which most recently performed 01/14/2021 revealed them to remain stable with a right renal aortic ratio of 3.5 and a left of 5.6.  His blood pressures have been under good control.  We will continue to follow him noninvasively on an annual basis.

## 2021-02-10 NOTE — Progress Notes (Signed)
02/10/2021 Semaj Coburn [facility]   March 23, 1945  [id number]  Primary Physician Garwin Brothers, MD Primary Cardiologist: [name] MD Lupe Carney, [place]  HPI:  [name] is a 75 y.o.  thin-appearing Caucasian male patient of [name], formally a patient [name]. I last spoke to on the phone for a virtual telemedicine phone visit 02/20/2019.  He has a long extensive vascular history back to 1994 when he had coronary bypass grafting at [facility]. He had redo bypass grafting at [facility] January 1996 with simultaneous right carotid endarterectomy performed by Dr. Donnetta Hutching. His other problems include hypertension, hyperlipidemia, peripheral vascular disease with occluded SFAs although with cardiac rehabilitation he no longer has claudication. He had right renal artery stenting by myself and Dr. Rollene Fare approximately 15 years ago with repeat intervention several years thereafter because of what sounds like re-in-stent restenosis. He's had annual renal Doppler studies which have shown progressive left renal artery stenosis with slight decrease in the left kidney dimensions. His blood pressures have been well-controlled and his renal function is normal.  He has persistent A. fib on Eliquis, rate controlled.  He was recently hospitalized and had a non-STEMI thought to be related to demand ischemia.  He had stenting of his SMA and celiac axis by Dr. 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  He was sent back to me as an add-on today for progressive lower extremity edema when I saw him back in June.  We adjusted his diuretics.  He has been having problems with anemia as well as volume overload.  He has had transfusions in Epogen injections.  There is a discrepancy in the 2D echo at [facility] which suggested moderate to severe aortic stenosis as opposed to the echo performed at [facility] which suggested mild aortic stenosis.   Since I spoke to him 2  years ago he has undergone femorofemoral crossover grafting for critical limb ischemia.  The vascular follow his lower extremity Dopplers and his mesenteric Dopplers.  He does have chronic A. fib rate controlled but no longer is on anticoagulation because of GI bleeding apparently.   Current Meds  Medication Sig   aspirin EC 81 MG tablet Take 1 tablet (81 mg total) by mouth daily.   butalbital-aspirin-caffeine (FIORINAL) 50-325-40 MG capsule Take 1 capsule by mouth daily as needed for headache.    Dapagliflozin Propanediol (FARXIGA PO) Take by mouth.   diltiazem (CARDIZEM CD) 120 MG 24 hr capsule Take 1 capsule (120 mg total) by mouth daily.   dutasteride (AVODART) 0.5 MG capsule Take 0.5 mg by mouth daily.    ezetimibe (ZETIA) 10 MG tablet TAKE 1 TABLET BY MOUTH EVERY DAY   furosemide (LASIX) 40 MG tablet Take 80 mg by mouth 2 (two) times daily.   Gabapentin Enacarbil (HORIZANT) 600 MG TBCR Take 600 mg by mouth daily as needed (restless leg). (1700)   Ketotifen Fumarate (ALAWAY OP) Place 1 drop into the right eye daily. Uses before putting contact Lens in R eye   loratadine (CLARITIN) 10 MG tablet Take 10 mg by mouth daily as needed for allergies, rhinitis or itching.    lovastatin (MEVACOR) 40 MG tablet TAKE 1 TABLET BY MOUTH EVERYDAY AT BEDTIME   metolazone (ZAROXOLYN) 2.5 MG tablet Take 2.5 mg by mouth daily as needed (fluid retention).    metoprolol succinate (TOPROL XL) 25 MG 24 hr tablet Take 1 tablet (25 mg total) by mouth at bedtime.   Multiple  Vitamins-Minerals (MULTIVITAMINS THER. W/MINERALS) TABS Take 1 tablet by mouth every evening.   nitroGLYCERIN (NITROLINGUAL) 0.4 MG/SPRAY spray PLACE 1 SPRAY UNDER THE TONGUE EVERY 5 (FIVE) MINUTES X 3 DOSES AS NEEDED FOR CHEST PAIN.   Potassium Chloride ER 20 MEQ TBCR Take 20 mEq by mouth as needed (With Metolazone).   ranolazine (RANEXA) 500 MG 12 hr tablet Take 500 mg by mouth 2 (two) times daily.   senna-docusate (SENOKOT-S) 8.6-50 MG tablet  Take 1 tablet by mouth daily.   vitamin E 200 UNIT capsule Take 200 Units by mouth every evening.     Allergies  Allergen Reactions   Codeine Nausea And Vomiting   Sulfa Antibiotics Rash   Sulfonamide Derivatives Rash   Oxycodone Nausea Only    Social History   Socioeconomic History   Marital status: Married    Spouse name: Not on file   Number of children: Not on file   Years of education: Not on file   Highest education level: Not on file  Occupational History   Not on file  Tobacco Use   Smoking status: Former    Packs/day: 1.00    Years: 38.00    Pack years: 38.00    Types: Cigarettes    Quit date: 02/09/2005    Years since quitting: 16.0   Smokeless tobacco: Never   Tobacco comments:    "quit smoking cigarrettes 2006"  Vaping Use   Vaping Use: Never used  Substance and Sexual Activity   Alcohol use: No    Alcohol/week: 0.0 standard drinks   Drug use: No   Sexual activity: Not on file  Other Topics Concern   Not on file  Social History Narrative   Not on file   Social Determinants of Health   Financial Resource Strain: Not on file  Food Insecurity: Not on file  Transportation Needs: Not on file  Physical Activity: Not on file  Stress: Not on file  Social Connections: Not on file  Intimate Partner Violence: Not on file     Review of Systems: General: negative for chills, fever, night sweats or weight changes.  Cardiovascular: negative for chest pain, dyspnea on exertion, edema, orthopnea, palpitations, paroxysmal nocturnal dyspnea or shortness of breath Dermatological: negative for rash Respiratory: negative for cough or wheezing Urologic: negative for hematuria Abdominal: negative for nausea, vomiting, diarrhea, bright red blood per rectum, melena, or hematemesis Neurologic: negative for visual changes, syncope, or dizziness All other systems reviewed and are otherwise negative except as noted above.    Blood pressure 135/66, pulse 84, height 5'  5" (1.651 m), weight 130 lb 12.8 oz (59.3 kg), SpO2 98 %.  General appearance: alert and no distress Neck: no adenopathy, no JVD, and supple, symmetrical, trachea midline Lungs: clear to auscultation bilaterally Heart: irregularly irregular rhythm Extremities: extremities normal, atraumatic, no cyanosis or edema Pulses: Diminished pedal pulses Skin: Skin color, texture, turgor normal. No rashes or lesions Neurologic: Grossly normal  EKG atrial fibrillation with a ventricular sponsor of 84 and right bundle branch block with left anterior fascicular block (bifascicular block).  I personally reviewed this EKG.  ASSESSMENT AND PLAN:   Renal artery stenosis (HCC) History of remote right renal artery intervention by Dr. Rollene Fare and myself with 3 intervention for in-stent restenosis.  We have been following his Doppler studies which most recently performed 01/14/2021 revealed them to remain stable with a right renal aortic ratio of 3.5 and a left of 5.6.  His blood pressures have been under  good control.  We will continue to follow him noninvasively on an annual basis.  Subclavian artery stenosis, left [facility]) Patient has had a history of left subclavian artery stenosis and/or occlusion for years.  Recent Doppler studies confirm this with retrograde left vertebral blood flow and a 40 mm blood pressure difference between the 2 arms.  He is minimally symptomatic.  No further evaluation is required.     [name] MD FACP,FACC,FAHA, [facility] 02/10/2021 1:14 PM

## 2021-02-10 NOTE — Assessment & Plan Note (Signed)
Patient has had a history of left subclavian artery stenosis and/or occlusion for years.  Recent Doppler studies confirm this with retrograde left vertebral blood flow and a 40 mm blood pressure difference between the 2 arms.  He is minimally symptomatic.  No further evaluation is required.

## 2021-02-18 DIAGNOSIS — M549 Dorsalgia, unspecified: Secondary | ICD-10-CM | POA: Diagnosis not present

## 2021-02-18 DIAGNOSIS — Z8781 Personal history of (healed) traumatic fracture: Secondary | ICD-10-CM | POA: Diagnosis not present

## 2021-02-18 DIAGNOSIS — M47896 Other spondylosis, lumbar region: Secondary | ICD-10-CM | POA: Diagnosis not present

## 2021-02-18 DIAGNOSIS — M5137 Other intervertebral disc degeneration, lumbosacral region: Secondary | ICD-10-CM | POA: Diagnosis not present

## 2021-02-23 DIAGNOSIS — Z9862 Peripheral vascular angioplasty status: Secondary | ICD-10-CM | POA: Diagnosis not present

## 2021-02-23 DIAGNOSIS — Z872 Personal history of diseases of the skin and subcutaneous tissue: Secondary | ICD-10-CM | POA: Diagnosis not present

## 2021-02-23 DIAGNOSIS — I739 Peripheral vascular disease, unspecified: Secondary | ICD-10-CM | POA: Diagnosis not present

## 2021-02-23 DIAGNOSIS — R600 Localized edema: Secondary | ICD-10-CM | POA: Diagnosis not present

## 2021-02-23 DIAGNOSIS — B351 Tinea unguium: Secondary | ICD-10-CM | POA: Diagnosis not present

## 2021-02-25 ENCOUNTER — Ambulatory Visit (HOSPITAL_COMMUNITY)
Admission: RE | Admit: 2021-02-25 | Discharge: 2021-02-25 | Disposition: A | Discharge status: Home / Self Care | Payer: Medicare PPO | Source: Ambulatory Visit | Attending: Vascular Surgery | Admitting: Vascular Surgery

## 2021-02-25 ENCOUNTER — Ambulatory Visit (INDEPENDENT_AMBULATORY_CARE_PROVIDER_SITE_OTHER)
Admission: RE | Admit: 2021-02-25 | Discharge: 2021-02-25 | Disposition: A | Discharge status: Home / Self Care | Payer: Medicare PPO | Source: Ambulatory Visit | Attending: Vascular Surgery | Admitting: Vascular Surgery

## 2021-02-25 ENCOUNTER — Ambulatory Visit: Payer: Medicare PPO | Admitting: Physician Assistant

## 2021-02-25 ENCOUNTER — Other Ambulatory Visit: Payer: Self-pay

## 2021-02-25 VITALS — BP 103/54 | HR 91 | Temp 97.9°F | Wt 130.9 lb

## 2021-02-25 DIAGNOSIS — I739 Peripheral vascular disease, unspecified: Secondary | ICD-10-CM

## 2021-02-25 DIAGNOSIS — Z95828 Presence of other vascular implants and grafts: Secondary | ICD-10-CM

## 2021-02-25 DIAGNOSIS — K551 Chronic vascular disorders of intestine: Secondary | ICD-10-CM | POA: Diagnosis not present

## 2021-02-25 NOTE — Progress Notes (Signed)
Office Note     CC:  follow up Requesting Provider:  Garwin Brothers, MD  HPI: [name] is a 75 y.o. (1945/04/02) male who presents for surveillance of PAD and mesenteric stenosis.  Surgical history is significant for left to right femoral to femoral bypass on 07/15/2019 by Dr. Oneida Alar for rest pain and ulcers of right foot.  He continues to be without rest pain of bilateral lower extremities.  He recently sustained an abrasion on the dorsal aspect of his right second toe however after seeing his podiatrist believes the area is improving.  He denies any nonhealing wounds of left lower extremity.  He is also followed for mesenteric artery stenosis with history of celiac and superior mesenteric artery stenting in May 2020.  He denies any postprandial pain.  He also has no fear of food or significant weight loss.  He admittedly does not eat much however attributes this to his lack of appetite and mobility.  He is ambulatory with a walker at home but his wife states he does not move around much.  He has atrial fibrillation however was taken off anticoagulation due to a GI bleed.  He is on aspirin daily.   Past Medical History:  Diagnosis Date   Anemia    Angina    Aortic stenosis    Atrial fibrillation ([place])    Blood transfusion    Bronchitis    Bulging discs    "8 of them; thoracic, lumbar, sacral area"   CHF (congestive heart failure) (HCC)    Chronic back pain    Chronic kidney disease    "mild kidney disease"   Colon polyps    Coronary artery disease 01/24/11   Successful PCI long segmental stensois  vein graft to the CX marginal vessel-graft previously stented prox. 3.0x27mm TAXUS stent mid seg 3.0x7mm TAXUS stent now tandem stens of 3.0x68mm & 3.0x47mm placed with the seg. 50,80 & 99% stenosis reduced to 0%   DDD (degenerative disc disease)    Diastolic heart failure ([place])    Dysrhythmia    "PAC's and PVC's"   Esophageal dilatation    "have had it done 7 times; last time ~  2001"   Esophageal stricture    GERD (gastroesophageal reflux disease)    Headache(784.0)    Heart murmur    Hiatal hernia    Hyperlipidemia    Hypertension    Myocardial infarction [facility]) 1991   Myocardial infarction ([place]) 2005   "had 3 heart attacks this year"   Paroxysmal atrial fibrillation ([place])    Pneumonia 2020   Pyloric stenosis    RBBB    Renal artery stenosis (HCC)    hhistory of right renal artery stenting and re-intervention for "in-stent restenosis.   S/P CABG (coronary artery bypass graft) [place]   Shortness of breath    "when I had the heart attacks"   Subclavian artery stenosis, left ([place])    Ulcer    "small; Dr. Henrene Pastor found it 02/2010"    Past Surgical History:  Procedure Laterality Date   ABDOMINAL AORTOGRAM W/LOWER EXTREMITY N/A 05/10/2019   Procedure: ABDOMINAL AORTOGRAM W/LOWER EXTREMITY;  Surgeon: Elam Dutch, MD;  Location: [place] [facility];  Service: Cardiovascular;  Laterality: N/A;   ADENOIDECTOMY     "when I was an infant"   APPENDECTOMY  1610   APPLICATION OF WOUND VAC Left 07/23/2019   Procedure: Application Of Wound Vac;  Surgeon: Scot Dock,  Judeth Cornfield, MD;  Location: [place];  Service: Vascular;  Laterality: Left;   BALLOON DILATION N/A 10/14/2018   Procedure: BALLOON DILATION;  Surgeon: Lavena Bullion, DO;  Location: [place];  Service: Gastroenterology;  Laterality: N/A;   BIOPSY  07/26/2018   Procedure: BIOPSY;  Surgeon: Thornton Park, MD;  Location: [place];  Service: Gastroenterology;;   BIOPSY  10/14/2018   Procedure: BIOPSY;  Surgeon: Lavena Bullion, DO;  Location: [place];  Service: Gastroenterology;;   CARDIOVERSION N/A 04/17/2017   Procedure: CARDIOVERSION;  Surgeon: Jerline Pain, MD;  Location: [place];  Service: Cardiovascular;  Laterality: N/A;   CAROTID ENDARTERECTOMY Right 1996   CE   COLONOSCOPY WITH PROPOFOL N/A 10/14/2018   Procedure: COLONOSCOPY WITH  PROPOFOL;  Surgeon: Lavena Bullion, DO;  Location: [place];  Service: Gastroenterology;  Laterality: N/A;   CORONARY ANGIOPLASTY WITH STENT PLACEMENT  01/24/11   "today makes a total of 9 stents"   CORONARY ARTERY BYPASS GRAFT  1984   CABG X 4   CORONARY ARTERY BYPASS GRAFT  1996   CABG X 4   ENDARTERECTOMY FEMORAL Right 07/15/2019   Procedure: RIGHT FEMORAL ENDARTARECTOMY WITH PROFUNDOPLASTY;  Surgeon: Elam Dutch, MD;  Location: [place];  Service: Vascular;  Laterality: Right;   ESOPHAGOGASTRODUODENOSCOPY (EGD) WITH PROPOFOL N/A 07/26/2018   Procedure: ESOPHAGOGASTRODUODENOSCOPY (EGD) WITH PROPOFOL;  Surgeon: Thornton Park, MD;  Location: [place];  Service: Gastroenterology;  Laterality: N/A;   ESOPHAGOGASTRODUODENOSCOPY (EGD) WITH PROPOFOL N/A 10/14/2018   Procedure: ESOPHAGOGASTRODUODENOSCOPY (EGD) WITH PROPOFOL;  Surgeon: Lavena Bullion, DO;  Location: [place];  Service: Gastroenterology;  Laterality: N/A;   EYE SURGERY  2009   detached retina in right eye   FEMORAL-FEMORAL BYPASS GRAFT Bilateral 07/15/2019   Procedure: LEFT TO RIGHT FEMORAL-FEMORAL ARTERY;  Surgeon: Elam Dutch, MD;  Location: [facility];  Service: Vascular;  Laterality: Bilateral;   FRACTURE SURGERY  07/03/2018   fractured right hip sx.   IR THORACENTESIS ASP PLEURAL SPACE W/IMG GUIDE  10/12/2018   LEFT HEART CATHETERIZATION WITH CORONARY/GRAFT ANGIOGRAM N/A 01/24/2011   Procedure: LEFT HEART CATHETERIZATION WITH Beatrix Fetters;  Surgeon: Troy Sine, MD;  Location: [facility];  Service: Cardiovascular;  Laterality: N/A;   PERIPHERAL VASCULAR INTERVENTION  07/16/2018   Procedure: PERIPHERAL VASCULAR INTERVENTION;  Surgeon: Elam Dutch, MD;  Location: [place] [facility];  Service: Cardiovascular;;  SMA and Celiac   pylondial cyst removal     RETINAL DETACHMENT SURGERY  04/2007   right   TONSILLECTOMY  1972   VISCERAL ANGIOGRAPHY N/A 07/16/2018   Procedure: VISCERAL  ANGIOGRAPHY;  Surgeon: Elam Dutch, MD;  Location: [place] [facility];  Service: Cardiovascular;  Laterality: N/A;   WOUND DEBRIDEMENT Left 07/23/2019   Procedure: INCISION AND DRAINAGE LEFT GROIN WOUND;  Surgeon: Angelia Mould, MD;  Location: [facility] OR;  Service: Vascular;  Laterality: Left;    Social History   Socioeconomic History   Marital status: Married    Spouse name: Not on file   Number of children: Not on file   Years of education: Not on file   Highest education level: Not on file  Occupational History   Not on file  Tobacco Use   Smoking status: Former    Packs/day: 1.00    Years: 38.00    Pack years: 38.00    Types: Cigarettes    Quit date: 02/09/2005    Years since quitting: 16.0   Smokeless tobacco:  Never   Tobacco comments:    "quit smoking cigarrettes 2006"  Vaping Use   Vaping Use: Never used  Substance and Sexual Activity   Alcohol use: No    Alcohol/week: 0.0 standard drinks   Drug use: No   Sexual activity: Not on file  Other Topics Concern   Not on file  Social History Narrative   Not on file   Social Determinants of Health   Financial Resource Strain: Not on file  Food Insecurity: Not on file  Transportation Needs: Not on file  Physical Activity: Not on file  Stress: Not on file  Social Connections: Not on file  Intimate Partner Violence: Not on file    Family History  Problem Relation Age of Onset   Breast cancer Paternal Aunt    Diabetes Sister    Ulcers Father    Heart disease Father    Heart failure Father    Heart attack Father    AAA (abdominal aortic aneurysm) Father    Stroke Mother    Hypertension Mother    Colon cancer Neg Hx     Current Outpatient Medications  Medication Sig Dispense Refill   aspirin EC 81 MG tablet Take 1 tablet (81 mg total) by mouth daily. 150 tablet 2   butalbital-aspirin-caffeine (FIORINAL) 50-325-40 MG capsule Take 1 capsule by mouth daily as needed for headache.      diltiazem  (CARDIZEM CD) 120 MG 24 hr capsule Take 1 capsule (120 mg total) by mouth daily. 90 capsule 3   dutasteride (AVODART) 0.5 MG capsule Take 0.5 mg by mouth daily.      ezetimibe (ZETIA) 10 MG tablet TAKE 1 TABLET BY MOUTH EVERY DAY 90 tablet 3   furosemide (LASIX) 40 MG tablet Take 80 mg by mouth 2 (two) times daily.     Gabapentin Enacarbil (HORIZANT) 600 MG TBCR Take 600 mg by mouth daily as needed (restless leg). (1700)     Ketotifen Fumarate (ALAWAY OP) Place 1 drop into the right eye daily. Uses before putting contact Lens in R eye     loratadine (CLARITIN) 10 MG tablet Take 10 mg by mouth daily as needed for allergies, rhinitis or itching.   3   lovastatin (MEVACOR) 40 MG tablet TAKE 1 TABLET BY MOUTH EVERYDAY AT BEDTIME 90 tablet 3   metolazone (ZAROXOLYN) 2.5 MG tablet Take 2.5 mg by mouth daily as needed (fluid retention).      metoprolol succinate (TOPROL XL) 25 MG 24 hr tablet Take 1 tablet (25 mg total) by mouth at bedtime. 90 tablet 3   Multiple Vitamins-Minerals (MULTIVITAMINS THER. W/MINERALS) TABS Take 1 tablet by mouth every evening.     nitroGLYCERIN (NITROLINGUAL) 0.4 MG/SPRAY spray PLACE 1 SPRAY UNDER THE TONGUE EVERY 5 (FIVE) MINUTES X 3 DOSES AS NEEDED FOR CHEST PAIN. 0.4 g 2   Potassium Chloride ER 20 MEQ TBCR Take 20 mEq by mouth as needed (With Metolazone).     ranolazine (RANEXA) 500 MG 12 hr tablet Take 500 mg by mouth 2 (two) times daily.     senna-docusate (SENOKOT-S) 8.6-50 MG tablet Take 1 tablet by mouth daily. 30 tablet 6   vitamin E 200 UNIT capsule Take 200 Units by mouth every evening.     Dapagliflozin Propanediol (FARXIGA PO) Take by mouth. (Patient not taking: Reported on 02/25/2021)     esomeprazole (NEXIUM) 40 MG capsule Take 1 capsule (40 mg total) by mouth 2 (two) times daily before a meal. 120 capsule  3   sucralfate (CARAFATE) 1 GM/10ML suspension Take 10 mLs (1 g total) by mouth 4 (four) times daily -  with meals and at bedtime. 1200 mL 11   No current  facility-administered medications for this visit.    Allergies  Allergen Reactions   Codeine Nausea And Vomiting   Sulfa Antibiotics Rash   Sulfonamide Derivatives Rash   Oxycodone Nausea Only     REVIEW OF SYSTEMS:   [X]  denotes positive finding, [ ]  denotes negative finding Cardiac  Comments:  Chest pain or chest pressure:    Shortness of breath upon exertion:    Short of breath when lying flat:    Irregular heart rhythm:        Vascular    Pain in calf, thigh, or hip brought on by ambulation:    Pain in feet at night that wakes you up from your sleep:     Blood clot in your veins:    Leg swelling:         Pulmonary    Oxygen at home:    Productive cough:     Wheezing:         Neurologic    Sudden weakness in arms or legs:     Sudden numbness in arms or legs:     Sudden onset of difficulty speaking or slurred speech:    Temporary loss of vision in one eye:     Problems with dizziness:         Gastrointestinal    Blood in stool:     Vomited blood:         Genitourinary    Burning when urinating:     Blood in urine:        Psychiatric    Major depression:         Hematologic    Bleeding problems:    Problems with blood clotting too easily:        Skin    Rashes or ulcers:        Constitutional    Fever or chills:      PHYSICAL EXAMINATION:  Vitals:   02/25/21 0928  BP: (!) 103/54  Pulse: 91  Temp: 97.9 F (36.6 C)  TempSrc: Skin  SpO2: 95%  Weight: 130 lb 14.4 oz (59.4 kg)    General:  WDWN in NAD; vital signs documented above Gait: Not observed HENT: WNL, normocephalic Pulmonary: normal non-labored breathing , without Rales, rhonchi,  wheezing Cardiac: regular HR Abdomen: soft, NT, no masses Skin: without rashes Vascular Exam/Pulses:  Right Left  Radial 2+ (normal) 2+ (normal)  DP absent absent  PT absent absent   Extremities: Palpable femorofemoral bypass pulse; pitting edema to the level of the mid shin bilaterally; surface  abrasion right second toe without ulceration or sign of infection Musculoskeletal: no muscle wasting or atrophy  Neurologic: A&O X 3;  No focal weakness or paresthesias are detected Psychiatric:  The pt has Normal affect.   Non-Invasive Vascular Imaging:   Difficult study per ultrasound tech however SMA stent appears patent with elevated velocity of 298 cm/s   ABI/TBI Today's ABI Today's TBI Previous ABI Previous TBI   +-------+-----------+-----------+------------+------------+   Right   0.67        0.27        0.43         absent         +-------+-----------+-----------+------------+------------+   Left    0.87  0.4        0.60         absent          ASSESSMENT/PLAN:: 75 y.o. male here for follow up for surveillance of PAD and mesenteric artery stenosis  -Patent femorofemoral bypass by palpation on exam; ABIs unchanged over the last 6 months.  Patient recently sustained an abrasion to his right second toe.  I stressed the importance of calling/returning to office if this fails to heal as he would need further work-up and revascularization if this were to develop into critical limb ischemia -He is also followed for mesenteric artery stenosis with history of celiac and SMA stenting.  The ultrasound technician was unable to capture detailed imaging today however was able to identify patent SMA stent despite elevated velocities.  Patient is currently asymptomatic.  If he were to develop symptoms we could consider CTA imaging and/or angiography. -We will recheck femorofemoral bypass duplex, ABIs, and mesenteric duplex in 6 months.  Patient will call/return office sooner with any questions or concerns   Dagoberto Ligas, PA-C [facility] [phone]  Clinic MD:   Scot Dock

## 2021-03-02 ENCOUNTER — Other Ambulatory Visit: Payer: Self-pay

## 2021-03-02 DIAGNOSIS — I739 Peripheral vascular disease, unspecified: Secondary | ICD-10-CM

## 2021-03-02 DIAGNOSIS — K551 Chronic vascular disorders of intestine: Secondary | ICD-10-CM

## 2021-03-11 DIAGNOSIS — M5137 Other intervertebral disc degeneration, lumbosacral region: Secondary | ICD-10-CM | POA: Diagnosis not present

## 2021-03-11 DIAGNOSIS — M549 Dorsalgia, unspecified: Secondary | ICD-10-CM | POA: Diagnosis not present

## 2021-03-11 DIAGNOSIS — G894 Chronic pain syndrome: Secondary | ICD-10-CM | POA: Diagnosis not present

## 2021-03-11 DIAGNOSIS — M47896 Other spondylosis, lumbar region: Secondary | ICD-10-CM | POA: Diagnosis not present

## 2021-03-11 DIAGNOSIS — Z8781 Personal history of (healed) traumatic fracture: Secondary | ICD-10-CM | POA: Diagnosis not present

## 2021-03-11 DIAGNOSIS — Z1389 Encounter for screening for other disorder: Secondary | ICD-10-CM | POA: Diagnosis not present

## 2021-04-11 DIAGNOSIS — Z8781 Personal history of (healed) traumatic fracture: Secondary | ICD-10-CM | POA: Diagnosis not present

## 2021-04-11 DIAGNOSIS — M5137 Other intervertebral disc degeneration, lumbosacral region: Secondary | ICD-10-CM | POA: Diagnosis not present

## 2021-04-11 DIAGNOSIS — M47896 Other spondylosis, lumbar region: Secondary | ICD-10-CM | POA: Diagnosis not present

## 2021-04-11 DIAGNOSIS — M549 Dorsalgia, unspecified: Secondary | ICD-10-CM | POA: Diagnosis not present

## 2021-04-15 ENCOUNTER — Other Ambulatory Visit: Payer: Self-pay | Admitting: Cardiovascular Disease

## 2021-04-16 NOTE — Progress Notes (Signed)
[facility]  [address] [place],  [place]  [address] [phone]  Clinic Day:  04/22/2021  Referring physician: Garwin Brothers, MD  This document serves as a record of services personally performed by Jaxyn Mestas Macarthur Critchley, MD. It was created on their behalf by [facility], a trained medical scribe. The creation of this record is based on the scribe's personal observations and the provider's statements to them.  HISTORY OF PRESENT ILLNESS:  The patient is a 76 y.o. male with anemia secondary to renal insufficiency.  Furthermore, recent labs also showed him to be iron deficient.  In the past, IV Feraheme was effective in bolstering his iron stores.  He comes in today to reassess his iron and hemoglobin levels.  Overall, the patient claims to be doing fairly well.  He denies having increased fatigue or any overt forms of blood loss which concern him for a declining hemoglobin.  PHYSICAL EXAM:  There were no vitals taken for this visit. Wt Readings from Last 3 Encounters:  02/25/21 130 lb 14.4 oz (59.4 kg)  02/10/21 130 lb 12.8 oz (59.3 kg)  10/21/20 133 lb 9.6 oz (60.6 kg)   There is no height or weight on file to calculate BMI. Performance status (ECOG): 1 - Symptomatic but completely ambulatory Physical Exam Constitutional:      Appearance: Normal appearance. He is not ill-appearing.  HENT:     Mouth/Throat:     Mouth: Mucous membranes are moist.     Pharynx: Oropharynx is clear. No oropharyngeal exudate or posterior oropharyngeal erythema.  Cardiovascular:     Rate and Rhythm: Normal rate and regular rhythm.     Heart sounds: No murmur heard.   No friction rub. No gallop.  Pulmonary:     Effort: Pulmonary effort is normal. No respiratory distress.     Breath sounds: Normal breath sounds. No wheezing, rhonchi or rales.  Abdominal:     General: Bowel sounds are normal. There is no distension.     Palpations: Abdomen is soft. There is no mass.      Tenderness: There is no abdominal tenderness.  Musculoskeletal:        General: No swelling.     Right lower leg: No edema.     Left lower leg: No edema.  Lymphadenopathy:     Cervical: No cervical adenopathy.     Upper Body:     Right upper body: No supraclavicular or axillary adenopathy.     Left upper body: No supraclavicular or axillary adenopathy.     Lower Body: No right inguinal adenopathy. No left inguinal adenopathy.  Skin:    General: Skin is warm.     Coloration: Skin is not jaundiced.     Findings: No lesion or rash.  Neurological:     General: No focal deficit present.     Mental Status: He is alert and oriented to person, place, and time. Mental status is at baseline.  Psychiatric:        Mood and Affect: Mood normal.        Behavior: Behavior normal.        Thought Content: Thought content normal.    LABS:   CBC Latest Ref Rng & Units 04/22/2021 10/21/2020 04/23/2020  WBC - 7.8 6.6 7.5  Hemoglobin 13.5 - 17.5 13.3(A) 13.1(A) 12.4(A)  Hematocrit 41 - 53 39(A) 38(A) 36(A)  Platelets 150 - 399 180 172 158   CMP Latest Ref Rng & Units 10/21/2020 04/23/2020  01/29/2020  Glucose 65 - 99 mg/dL - - 88  BUN 4 - 21 31(A) 28(A) 27  Creatinine 0.6 - 1.3 2.1(A) 1.9(A) 1.66(H)  Sodium 137 - 147 140 140 143  Potassium 3.4 - 5.3 3.9 4.1 4.2  Chloride 99 - 108 102 102 102  CO2 13 - 22 28(A) 29(A) 25  Calcium 8.7 - 10.7 9.0 9.0 8.7  Total Protein 6.0 - 8.5 g/dL - - 6.5  Total Bilirubin 0.0 - 1.2 mg/dL - - <0.2  Alkaline Phos 25 - 125 115 146(A) 151(H)  AST 14 - 40 33 24 23  ALT 10 - 40 13 14 12     Latest Reference Range & Units 04/22/21 09:00  Iron 45 - 182 ug/dL 76  UIBC ug/dL 255  TIBC 250 - 450 ug/dL 331  Saturation Ratios 17.9 - 39.5 % 23  Ferritin 24 - 336 ng/mL 74   ASSESSMENT & PLAN:  Assessment/Plan:  A 76 y.o. male with anemia secondary to chronic renal insufficiency and iron deficiency.  I am pleased as his hemoglobin is even better at 13.3 today.   Furthermore, his iron parameters remain fine.  Clinically, the patient is doing well.  As that is the case, I will see him back in 6 months for repeat clinical assessment. The patient understands all the plans discussed today and is in agreement with them.     I, Rita Ohara, am acting as scribe for Marice Potter, MD    I have reviewed this report as typed by the medical scribe, and it is complete and accurate.  Bentzion Dauria Macarthur Critchley, MD

## 2021-04-21 DIAGNOSIS — H25813 Combined forms of age-related cataract, bilateral: Secondary | ICD-10-CM | POA: Diagnosis not present

## 2021-04-22 ENCOUNTER — Encounter: Payer: Self-pay | Admitting: Oncology

## 2021-04-22 ENCOUNTER — Other Ambulatory Visit: Payer: Self-pay

## 2021-04-22 ENCOUNTER — Inpatient Hospital Stay: Payer: Medicare PPO | Admitting: Oncology

## 2021-04-22 ENCOUNTER — Other Ambulatory Visit: Payer: Self-pay | Admitting: Oncology

## 2021-04-22 ENCOUNTER — Telehealth: Payer: Self-pay

## 2021-04-22 ENCOUNTER — Inpatient Hospital Stay: Payer: Medicare PPO | Attending: Oncology

## 2021-04-22 ENCOUNTER — Other Ambulatory Visit: Payer: Self-pay | Admitting: Hematology and Oncology

## 2021-04-22 DIAGNOSIS — Z79899 Other long term (current) drug therapy: Secondary | ICD-10-CM | POA: Insufficient documentation

## 2021-04-22 DIAGNOSIS — D631 Anemia in chronic kidney disease: Secondary | ICD-10-CM | POA: Insufficient documentation

## 2021-04-22 DIAGNOSIS — N189 Chronic kidney disease, unspecified: Secondary | ICD-10-CM | POA: Diagnosis not present

## 2021-04-22 DIAGNOSIS — D509 Iron deficiency anemia, unspecified: Secondary | ICD-10-CM

## 2021-04-22 DIAGNOSIS — K551 Chronic vascular disorders of intestine: Secondary | ICD-10-CM | POA: Diagnosis not present

## 2021-04-22 DIAGNOSIS — D649 Anemia, unspecified: Secondary | ICD-10-CM | POA: Diagnosis not present

## 2021-04-22 LAB — CBC: RBC: 3.79 — AB (ref 3.87–5.11)

## 2021-04-22 LAB — CBC AND DIFFERENTIAL
HCT: 39 — AB (ref 41–53)
Hemoglobin: 13.3 — AB (ref 13.5–17.5)
Neutrophils Absolute: 5.38
Platelets: 180 (ref 150–399)
WBC: 7.8

## 2021-04-22 LAB — IRON AND TIBC
Iron: 76 ug/dL (ref 45–182)
Saturation Ratios: 23 % (ref 17.9–39.5)
TIBC: 331 ug/dL (ref 250–450)
UIBC: 255 ug/dL

## 2021-04-22 LAB — FERRITIN: Ferritin: 74 ng/mL (ref 24–336)

## 2021-04-22 NOTE — Telephone Encounter (Signed)
Patient notified of 3 month followup appt on 07/22/21 at 0900 for labs and 0930 for Dr. [name] Rumpf- Patient states understanding, repeats back date and time

## 2021-05-09 DIAGNOSIS — M549 Dorsalgia, unspecified: Secondary | ICD-10-CM | POA: Diagnosis not present

## 2021-05-09 DIAGNOSIS — M5137 Other intervertebral disc degeneration, lumbosacral region: Secondary | ICD-10-CM | POA: Diagnosis not present

## 2021-05-09 DIAGNOSIS — M47896 Other spondylosis, lumbar region: Secondary | ICD-10-CM | POA: Diagnosis not present

## 2021-05-09 DIAGNOSIS — Z8781 Personal history of (healed) traumatic fracture: Secondary | ICD-10-CM | POA: Diagnosis not present

## 2021-05-10 DIAGNOSIS — H9313 Tinnitus, bilateral: Secondary | ICD-10-CM | POA: Diagnosis not present

## 2021-05-10 DIAGNOSIS — H9190 Unspecified hearing loss, unspecified ear: Secondary | ICD-10-CM | POA: Diagnosis not present

## 2021-05-13 DIAGNOSIS — H40011 Open angle with borderline findings, low risk, right eye: Secondary | ICD-10-CM | POA: Diagnosis not present

## 2021-06-06 ENCOUNTER — Other Ambulatory Visit: Payer: Self-pay | Admitting: Gastroenterology

## 2021-06-08 DIAGNOSIS — M47816 Spondylosis without myelopathy or radiculopathy, lumbar region: Secondary | ICD-10-CM | POA: Diagnosis not present

## 2021-06-08 DIAGNOSIS — M5137 Other intervertebral disc degeneration, lumbosacral region: Secondary | ICD-10-CM | POA: Diagnosis not present

## 2021-06-09 DIAGNOSIS — M5137 Other intervertebral disc degeneration, lumbosacral region: Secondary | ICD-10-CM | POA: Diagnosis not present

## 2021-06-09 DIAGNOSIS — M47896 Other spondylosis, lumbar region: Secondary | ICD-10-CM | POA: Diagnosis not present

## 2021-06-09 DIAGNOSIS — Z8781 Personal history of (healed) traumatic fracture: Secondary | ICD-10-CM | POA: Diagnosis not present

## 2021-06-09 DIAGNOSIS — M549 Dorsalgia, unspecified: Secondary | ICD-10-CM | POA: Diagnosis not present

## 2021-06-14 ENCOUNTER — Encounter: Payer: Self-pay | Admitting: Cardiology

## 2021-06-14 ENCOUNTER — Ambulatory Visit: Payer: Medicare PPO | Admitting: Cardiology

## 2021-06-14 VITALS — BP 125/67 | HR 71 | Ht 65.0 in | Wt 126.2 lb

## 2021-06-14 DIAGNOSIS — I4821 Permanent atrial fibrillation: Secondary | ICD-10-CM | POA: Diagnosis not present

## 2021-06-14 NOTE — Patient Instructions (Signed)
Medication Instructions:  ?Your physician recommends that you continue on your current medications as directed. Please refer to the Current Medication list given to you today. ? ?*If you need a refill on your cardiac medications before your next appointment, please call your pharmacy* ? ? ?Lab Work: ?None ordered. ? ?If you have labs (blood work) drawn today and your tests are completely normal, you will receive your results only by: ?MyChart Message (if you have MyChart) OR ?A paper copy in the mail ?If you have any lab test that is abnormal or we need to change your treatment, we will call you to review the results. ? ? ?Testing/Procedures: ?None ordered. ? ? ? ?Follow-Up: ?At [facility], you and your health needs are our priority.  As part of our continuing mission to provide you with exceptional heart care, we have created designated Provider Care Teams.  These Care Teams include your primary Cardiologist (physician) and Advanced Practice Providers (APPs -  Physician Assistants and Nurse Practitioners) who all work together to provide you with the care you need, when you need it. ? ?We recommend signing up for the patient portal called "MyChart".  Sign up information is provided on this After Visit Summary.  MyChart is used to connect with patients for Virtual Visits (Telemedicine).  Patients are able to view lab/test results, encounter notes, upcoming appointments, etc.  Non-urgent messages can be sent to your provider as well.   ?To learn more about what you can do with MyChart, go to [email or web address].   ? ?Your next appointment:   ?Follow up with Dr Curt Bears as needed ? ?Follow up with Dr Claiborne Billings in 3 months ? ?Important Information About Sugar ? ? ? ? ?  ?

## 2021-06-14 NOTE — Progress Notes (Signed)
? ?Electrophysiology Office Note ? ? ?Date:  06/14/2021  ? ?ID:  [name], DOB 1945/03/27, MRN [id number] ? ?PCP:  Garwin Brothers, MD  ?Cardiologist:  Claiborne Billings ?Primary Electrophysiologist:  Candace Begue Meredith Leeds, MD   ? ?No chief complaint on file. ? ?  ?History of Present Illness: ?[name] is a 76 y.o. male who is being seen today for the [name] of atrial fibrillation at the request of [name].   ? ?He has a history significant for coronary artery disease status post CABG 19 1984 in [place], carotid endarterectomy, left main stent, renal artery stenosis status post dilation in 2005, right bundle branch block, peripheral vascular disease with bilateral SFA occlusions, subclavian artery occlusion, right subclavian stenosis, hypertension, hyperlipidemia, remote smoking history.  He has atrial fibrillation that is become permanent.  He is currently not anticoagulated due to GI bleeding. ? ?Today, denies symptoms of palpitations, orthopnea, PND, lower extremity edema, claudication, dizziness, presyncope, syncope, bleeding, or neurologic sequela. The patient is tolerating medications without difficulties.  He is overall doing about the same as he usually does.  He has no chest pain or shortness of breath currently, but does get intermittent chest pain.  This is relieved by his nitroglycerin.  He is currently unaware of his atrial fibrillation, stating that he is no longer fatigued.  He is short of breath, but he did have COVID last year and has residual issues from that. ? ? ?Past Medical History:  ?Diagnosis Date  ? Anemia   ? Angina   ? Aortic stenosis   ? Atrial fibrillation ([place])   ? Blood transfusion   ? Bronchitis   ? Bulging discs   ? "8 of them; thoracic, lumbar, sacral area"  ? CHF (congestive heart failure) ([place])   ? Chronic back pain   ? Chronic kidney disease   ? "mild kidney disease"  ? Colon polyps   ? Coronary artery disease  01/24/11  ? Successful PCI long segmental stensois  vein graft to the CX marginal vessel-graft previously stented prox. 3.0x85m TAXUS stent mid seg 3.0x237mTAXUS stent now tandem stens of 3.0x3876m 3.0x8mm69maced with the seg. 50,80 & 99% stenosis reduced to 0%  ? DDD (degenerative disc disease)   ? Diastolic heart failure (HCC)[place]? Dysrhythmia   ? "PAC's and PVC's"  ? Esophageal dilatation   ? "have had it done 7 times; last time ~ 2001"  ? Esophageal stricture   ? GERD (gastroesophageal reflux disease)   ? Headache(784.0)   ? Heart murmur   ? Hiatal hernia   ? Hyperlipidemia   ? Hypertension   ? [facility]  [facility]  ? "had 3 heart attacks this year"  ? Paroxysmal atrial fibrillation (HCC)   ? Pneumonia 2020  ? Pyloric stenosis   ? RBBB   ? Renal artery stenosis (HCC)[place]? hhistory of right renal artery stenting and re-intervention for "in-stent restenosis.  ? S/P CABG (coronary artery bypass graft) 1984 & 1996  ? CHAPAlta SierraShortness of breath   ? "when I had the heart attacks"  ? Subclavian artery stenosis, left (HCC)   ? Ulcer   ? "small; Dr. PerrHenrene Pastornd it 02/2010"  ? ?Past Surgical History:  ?Procedure Laterality Date  ? ABDOMINAL AORTOGRAM W/LOWER EXTREMITY N/A 05/10/2019  ? Procedure: ABDOMINAL AORTOGRAM W/LOWER EXTREMITY;  Surgeon: FielElam Dutch;  Location: [facility];  Service: Cardiovascular;  Laterality: N/A;  ? ADENOIDECTOMY    ? "when I was an infant"  ? APPENDECTOMY  1953  ? APPLICATION OF WOUND VAC Left 07/23/2019  ? Procedure: Application Of Wound Vac;  Surgeon: Angelia Mould, MD;  Location: [facility];  Service: Vascular;  Laterality: Left;  ? BALLOON DILATION N/A 10/14/2018  ? Procedure: BALLOON DILATION;  Surgeon: Lavena Bullion, DO;  Location: [facility];  Service: Gastroenterology;  Laterality: N/A;  ? BIOPSY  07/26/2018  ? Procedure: BIOPSY;  Surgeon: Thornton Park, MD;  Location: [facility];   Service: Gastroenterology;;  ? BIOPSY  10/14/2018  ? Procedure: BIOPSY;  Surgeon: Lavena Bullion, DO;  Location: [facility];  Service: Gastroenterology;;  ? CARDIOVERSION N/A 04/17/2017  ? Procedure: CARDIOVERSION;  Surgeon: Jerline Pain, MD;  Location[facility] ENDOSCOPY;  Service: Cardiovascular;  Laterality: N/A;  ? CAROTID ENDARTERECTOMY Right 1996  ? CE  ? COLONOSCOPY WITH PROPOFOL N/A 10/14/2018  ? Procedure: COLONOSCOPY WITH PROPOFOL;  Surgeon: Lavena Bullion, DO;  Location: [facility] ENDOSCOPY;  Service: Gastroenterology;  Laterality: N/A;  ? CORONARY ANGIOPLASTY WITH STENT PLACEMENT  01/24/11  ? "today makes a total of 9 stents"  ? CORONARY ARTERY BYPASS GRAFT  1984  ? CABG X 4  ? CORONARY ARTERY BYPASS GRAFT  1996  ? CABG X 4  ? ENDARTERECTOMY FEMORAL Right 07/15/2019  ? Procedure: RIGHT FEMORAL ENDARTARECTOMY WITH PROFUNDOPLASTY;  Surgeon: Elam Dutch, MD;  [facility];  Service: Vascular;  Laterality: Right;  ? ESOPHAGOGASTRODUODENOSCOPY (EGD) WITH PROPOFOL N/A 07/26/2018  ? Procedure: ESOPHAGOGASTRODUODENOSCOPY (EGD) WITH PROPOFOL;  Surgeon: Thornton Park, MD;  Location: [facility];  Service: Gastroenterology;  Laterality: N/A;  ? ESOPHAGOGASTRODUODENOSCOPY (EGD) WITH PROPOFOL N/A 10/14/2018  ? Procedure: ESOPHAGOGASTRODUODENOSCOPY (EGD) WITH PROPOFOL;  Surgeon: Lavena Bullion, DO;  Location: [facility] ENDOSCOPY;  Service: Gastroenterology;  Laterality: N/A;  ? EYE SURGERY  2009  ? detached retina in right eye  ? FEMORAL-FEMORAL BYPASS GRAFT Bilateral 07/15/2019  ? Procedure: LEFT TO RIGHT FEMORAL-FEMORAL ARTERY;  Surgeon: Elam Dutch, MD;  Location: [facility] OR;  Service: Vascular;  Laterality: Bilateral;  ? FRACTURE SURGERY  07/03/2018  ? fractured right hip sx.  ? IR THORACENTESIS ASP PLEURAL SPACE W/IMG GUIDE  10/12/2018  ? LEFT HEART CATHETERIZATION WITH CORONARY/GRAFT ANGIOGRAM N/A 01/24/2011  ? Procedure: LEFT HEART CATHETERIZATION WITH Beatrix Fetters;  Surgeon: [place] Sine, MD;   Location: [facility] CATH LAB;  Service: Cardiovascular;  Laterality: N/A;  ? PERIPHERAL VASCULAR INTERVENTION  07/16/2018  ? Procedure: PERIPHERAL VASCULAR INTERVENTION;  Surgeon: Elam Dutch, MD;  Location: [facility] CV LAB;  Service: Cardiovascular;;  SMA and Celiac  ? pylondial cyst removal    ? RETINAL DETACHMENT SURGERY  04/2007  ? right  ? TONSILLECTOMY  1972  ? VISCERAL ANGIOGRAPHY N/A 07/16/2018  ? Procedure: VISCERAL ANGIOGRAPHY;  Surgeon: Elam Dutch, MD;  Location: [facility] CV LAB;  Service: Cardiovascular;  Laterality: N/A;  ? WOUND DEBRIDEMENT Left 07/23/2019  ? Procedure: INCISION AND DRAINAGE LEFT GROIN WOUND;  Surgeon: Angelia Mould, MD;  Location: [facility];  Service: Vascular;  Laterality: Left;  ? ? ? ?Current Outpatient Medications  ?Medication Sig Dispense Refill  ? aspirin EC 81 MG tablet Take 1 tablet (81 mg total) by mouth daily. 150 tablet 2  ? butalbital-aspirin-caffeine (FIORINAL) 50-325-40 MG capsule Take 1 capsule by mouth daily as needed for headache.     ? diltiazem (  CARDIZEM CD) 120 MG 24 hr capsule Take 1 capsule (120 mg total) by mouth daily. 90 capsule 3  ? dutasteride (AVODART) 0.5 MG capsule Take 0.5 mg by mouth daily.     ? esomeprazole (NEXIUM) 40 MG capsule TAKE 1 CAPSULE (40 MG TOTAL) BY MOUTH 2 (TWO) TIMES DAILY BEFORE A MEAL. 60 capsule 7  ? ezetimibe (ZETIA) 10 MG tablet TAKE 1 TABLET BY MOUTH EVERY DAY 90 tablet 3  ? fluticasone (FLONASE) 50 MCG/ACT nasal spray Place into both nostrils.    ? furosemide (LASIX) 40 MG tablet Take 80 mg by mouth 2 (two) times daily.    ? Gabapentin Enacarbil (HORIZANT) 600 MG TBCR Take 600 mg by mouth daily as needed (restless leg). (1700)    ? Ketotifen Fumarate (ALAWAY OP) Place 1 drop into the right eye daily. Uses before putting contact Lens in R eye    ? Lidocaine (ZTLIDO) 1.8 % PTCH APPLY TOPICALLY TO THE AFFECTED AREA 1-2 TIMES DAILY, SEPARATED BY 12 HOURS    ? loratadine (CLARITIN) 10 MG tablet Take 10 mg by mouth daily as  needed for allergies, rhinitis or itching.   3  ? lovastatin (MEVACOR) 40 MG tablet TAKE 1 TABLET BY MOUTH EVERYDAY AT BEDTIME 90 tablet 3  ? metolazone (ZAROXOLYN) 2.5 MG tablet Take 2.5 mg by mouth daily as

## 2021-06-18 ENCOUNTER — Other Ambulatory Visit: Payer: Self-pay | Admitting: Cardiology

## 2021-06-24 DIAGNOSIS — Z872 Personal history of diseases of the skin and subcutaneous tissue: Secondary | ICD-10-CM | POA: Diagnosis not present

## 2021-06-24 DIAGNOSIS — I739 Peripheral vascular disease, unspecified: Secondary | ICD-10-CM | POA: Diagnosis not present

## 2021-06-24 DIAGNOSIS — B351 Tinea unguium: Secondary | ICD-10-CM | POA: Diagnosis not present

## 2021-06-24 DIAGNOSIS — R600 Localized edema: Secondary | ICD-10-CM | POA: Diagnosis not present

## 2021-07-05 DIAGNOSIS — H9193 Unspecified hearing loss, bilateral: Secondary | ICD-10-CM | POA: Diagnosis not present

## 2021-07-05 DIAGNOSIS — H903 Sensorineural hearing loss, bilateral: Secondary | ICD-10-CM | POA: Diagnosis not present

## 2021-07-05 DIAGNOSIS — H9313 Tinnitus, bilateral: Secondary | ICD-10-CM | POA: Diagnosis not present

## 2021-07-05 DIAGNOSIS — Z77122 Contact with and (suspected) exposure to noise: Secondary | ICD-10-CM | POA: Diagnosis not present

## 2021-07-05 DIAGNOSIS — H9190 Unspecified hearing loss, unspecified ear: Secondary | ICD-10-CM | POA: Diagnosis not present

## 2021-07-09 DIAGNOSIS — M47896 Other spondylosis, lumbar region: Secondary | ICD-10-CM | POA: Diagnosis not present

## 2021-07-09 DIAGNOSIS — M5137 Other intervertebral disc degeneration, lumbosacral region: Secondary | ICD-10-CM | POA: Diagnosis not present

## 2021-07-09 DIAGNOSIS — M549 Dorsalgia, unspecified: Secondary | ICD-10-CM | POA: Diagnosis not present

## 2021-07-09 DIAGNOSIS — Z8781 Personal history of (healed) traumatic fracture: Secondary | ICD-10-CM | POA: Diagnosis not present

## 2021-07-14 DIAGNOSIS — M47816 Spondylosis without myelopathy or radiculopathy, lumbar region: Secondary | ICD-10-CM | POA: Diagnosis not present

## 2021-07-14 DIAGNOSIS — G894 Chronic pain syndrome: Secondary | ICD-10-CM | POA: Diagnosis not present

## 2021-07-14 DIAGNOSIS — M5137 Other intervertebral disc degeneration, lumbosacral region: Secondary | ICD-10-CM | POA: Diagnosis not present

## 2021-07-18 ENCOUNTER — Other Ambulatory Visit: Payer: Self-pay | Admitting: Cardiovascular Disease

## 2021-07-22 ENCOUNTER — Ambulatory Visit: Payer: Medicare PPO | Admitting: Oncology

## 2021-07-22 ENCOUNTER — Other Ambulatory Visit: Payer: Medicare PPO

## 2021-07-28 DIAGNOSIS — H833X3 Noise effects on inner ear, bilateral: Secondary | ICD-10-CM | POA: Diagnosis not present

## 2021-07-28 DIAGNOSIS — J3089 Other allergic rhinitis: Secondary | ICD-10-CM | POA: Diagnosis not present

## 2021-07-29 DIAGNOSIS — H40011 Open angle with borderline findings, low risk, right eye: Secondary | ICD-10-CM | POA: Diagnosis not present

## 2021-08-09 DIAGNOSIS — Z8781 Personal history of (healed) traumatic fracture: Secondary | ICD-10-CM | POA: Diagnosis not present

## 2021-08-09 DIAGNOSIS — M549 Dorsalgia, unspecified: Secondary | ICD-10-CM | POA: Diagnosis not present

## 2021-08-09 DIAGNOSIS — M5137 Other intervertebral disc degeneration, lumbosacral region: Secondary | ICD-10-CM | POA: Diagnosis not present

## 2021-08-09 DIAGNOSIS — M47896 Other spondylosis, lumbar region: Secondary | ICD-10-CM | POA: Diagnosis not present

## 2021-08-16 DIAGNOSIS — I4821 Permanent atrial fibrillation: Secondary | ICD-10-CM | POA: Diagnosis not present

## 2021-08-16 DIAGNOSIS — N1832 Chronic kidney disease, stage 3b: Secondary | ICD-10-CM | POA: Diagnosis not present

## 2021-08-16 DIAGNOSIS — E782 Mixed hyperlipidemia: Secondary | ICD-10-CM | POA: Diagnosis not present

## 2021-08-16 DIAGNOSIS — I10 Essential (primary) hypertension: Secondary | ICD-10-CM | POA: Diagnosis not present

## 2021-09-08 DIAGNOSIS — M5137 Other intervertebral disc degeneration, lumbosacral region: Secondary | ICD-10-CM | POA: Diagnosis not present

## 2021-09-08 DIAGNOSIS — Z8781 Personal history of (healed) traumatic fracture: Secondary | ICD-10-CM | POA: Diagnosis not present

## 2021-09-08 DIAGNOSIS — M549 Dorsalgia, unspecified: Secondary | ICD-10-CM | POA: Diagnosis not present

## 2021-09-08 DIAGNOSIS — M47896 Other spondylosis, lumbar region: Secondary | ICD-10-CM | POA: Diagnosis not present

## 2021-10-04 ENCOUNTER — Telehealth: Payer: Self-pay | Admitting: Cardiovascular Disease

## 2021-10-04 NOTE — Telephone Encounter (Signed)
Patient aware he will need to keep in-person visit, no telehealth options on 8/3. He reports no reason he needs to have his appt type changed and will keep visit as scheduled

## 2021-10-04 NOTE — Telephone Encounter (Signed)
Patient called wanting to have a tele-visit on Thursday, 8/3 instead of an in office visit.

## 2021-10-07 ENCOUNTER — Ambulatory Visit: Payer: Medicare PPO | Admitting: Cardiovascular Disease

## 2021-10-07 ENCOUNTER — Encounter: Payer: Self-pay | Admitting: Cardiovascular Disease

## 2021-10-07 DIAGNOSIS — Z951 Presence of aortocoronary bypass graft: Secondary | ICD-10-CM | POA: Diagnosis not present

## 2021-10-07 DIAGNOSIS — I4821 Permanent atrial fibrillation: Secondary | ICD-10-CM

## 2021-10-07 DIAGNOSIS — I6523 Occlusion and stenosis of bilateral carotid arteries: Secondary | ICD-10-CM

## 2021-10-07 DIAGNOSIS — E785 Hyperlipidemia, unspecified: Secondary | ICD-10-CM

## 2021-10-07 DIAGNOSIS — I4510 Unspecified right bundle-branch block: Secondary | ICD-10-CM | POA: Diagnosis not present

## 2021-10-07 DIAGNOSIS — I701 Atherosclerosis of renal artery: Secondary | ICD-10-CM | POA: Diagnosis not present

## 2021-10-07 DIAGNOSIS — K551 Chronic vascular disorders of intestine: Secondary | ICD-10-CM | POA: Diagnosis not present

## 2021-10-07 DIAGNOSIS — G2581 Restless legs syndrome: Secondary | ICD-10-CM

## 2021-10-07 DIAGNOSIS — I771 Stricture of artery: Secondary | ICD-10-CM

## 2021-10-07 DIAGNOSIS — I739 Peripheral vascular disease, unspecified: Secondary | ICD-10-CM

## 2021-10-07 DIAGNOSIS — I25709 Atherosclerosis of coronary artery bypass graft(s), unspecified, with unspecified angina pectoris: Secondary | ICD-10-CM

## 2021-10-07 NOTE — Patient Instructions (Addendum)
Medication Instructions:   No changes *If you need a refill on your cardiac medications before your next appointment, please call your pharmacy*   Lab Work: Not needed    Testing/Procedures:   Will be schedule at [facility] has requested that you have a carotid duplex. This test is an ultrasound of the carotid arteries in your neck. It looks at blood flow through these arteries that supply the brain with blood. Allow one hour for this exam. There are no restrictions or special instructions.  And    [address] Your physician has requested that you have an echocardiogram. Echocardiography is a painless test that uses sound waves to create images of your heart. It provides your doctor with information about the size and shape of your heart and how well your heart's chambers and valves are working. This procedure takes approximately one hour. There are no restrictions for this procedure.  Follow-Up: At [facility], you and your health needs are our priority.  As part of our continuing mission to provide you with exceptional heart care, we have created designated Provider Care Teams.  These Care Teams include your primary Cardiologist (physician) and Advanced Practice Providers (APPs -  Physician Assistants and Nurse Practitioners) who all work together to provide you with the care you need, when you need it.     Your next appointment:   6 month(s)  The format for your next appointment:   In Person  Provider:   Shelva Majestic, MD    Other Instructions   Call vascular surgeons - to see Surgeons not a P.A. or NP

## 2021-10-07 NOTE — Progress Notes (Signed)
Patient ID: [name] [name], male   DOB: 1945/11/13, 76 y.o.   MRN: [id number]    Primary MD:  [name] [name]  HPI: [name] [name] is [name] 76 y.o. male who is [name] former patient of [name] [name].  He presents for [name] 19 month follow-up cardiology evaluation.  [name] [name] has an extensive cardiac and peripheral vascular history. He underwent initial CABG revascularization surgery in [employer] in 1984. In 1996 he required re-do CABG revascularization surgery after all grafts were [name] to be occluded. At that time he also underwent right carotid endarterectomy. In 1998 he underwent stenting of his left main coronary artery and also had stenting done to the vein graft supplying the marginal vessel. He has history of right renal artery stenosis and underwent initial stenting with predilatation 2005. He has documented chronic right bundle branch block. He also significant peripheral vascular disease with bilateral SFA occlusion. He has known left subclavian artery occlusion with right subclavian stenosis. Has [name] history of hypertension, hyperlipidemia, and remotely smoked cigarettes. His last catheterization was done in November 2012 which showed significant native CAD with evidence for patent left main stent. He had an occluded circumflex marginal vessel from the AV groove circumflex. There was 80% ostial RCA stenosis with 90% stenosis in the mid right coronary artery proximal to the RV marginal branch and total occlusion of the mid RCA. An occluded vein graft supplying the right coronary artery but evidence for collateralization to the distal right artery the left coronary injection and injection of the grafts. The vein supplying the marginal vessel with the previously placed ostial proximal Taxus stent had in-stent narrowing of 50% followed by 95% stenosis in the body of the proximal third of the vessel graft prior to the previously placed mid grafts 3.0x20 mm Taxus stent. At that time, he underwent  difficult but successful intervention involving long segmental stenosis of the vein graft supplying the circumflex marginal vessel. Additional tandem 3.0x38 mm and 3.0x8 mm Taxus stents were inserted with excellent angiographic result.   He saw [name] [name] in August 2014 and due to concern for progressive carotid stenoses repeat Doppler studies were done and he also saw [name] [name] for followup evaluation. Repeat imaging showed [name] patent right carotid endarterectomy site the Doppler velocity suggestive of 40  percent stenosis of the bilateral proximal internal carotid arteries. There was left subclavian steal noted with retrograde left vertebral artery flow, monophasic left subclavian artery flow and significant difference in the bilateral brachial pressures.  He established cardiology care with me in December 2014.  [name] nuclear perfusion study in May 2015 demonstrated mild inferobasal ischemia, most likely secondary to RCA distribution.    On 03/25/2014 he was seen by [name] [name] after he developed an episode of atrial fibrillation the day before.  An ECG in [name] [name]  office showed atrial fibrillation with rate of 120.  His atenolol was increased to 50 mg twice [name], and he converted to sinus rhythm after 18 hours.  When he saw [name] [name] the following day he was in sinus rhythm.  On the increased dose of beta blocker he denies recurrent palpitations.  He tells me he recently underwent [name] colonoscopy and had 3 polyps removed, but also required cauterization for another site that was oozing blood.  This reason, he was not restarted on anticoagulation but has been maintained on low-dose aspirin and Plavix.  He has seen [name] [name] in follow-up of his PVD and is status post  left renal stenting.  He also is followed by [name] [name] for his carotid disease.  In August 2017 he was seen by [name] [name] after he was [name] to be in atrial fibrillation 1 week previously at cardiac rehabilitation.  He was started on  amiodarone 200 mg twice [name] for 1 week and then 200 mg daily and his atenolol was reduced to 25 mg twice [name].  He was seen several weeks later on 10/29/2015 by [name] [name] and was back in sinus rhythm.  He underwent carotid duplex imaging on 12/23/2015.  The right carotid endarterectomy site had Doppler velocities suggestive of [name] 60-79% stenosis. The left internal carotid artery velocity suggested [name] 40-59% stenoses.  Renal duplex imaging to just [name] patent right renal artery stent.  There was stable greater than 60% left renal artery stenosis.    When I saw him in November 2017 he was maintaining sinus rhythm and  since he was on amiodarone, I recommended he reduce lovastatin to 20 mg and added Zetia for continued aggressive lipid management.  He had progressive lower extremity edema and recommended he increase his torsemide.  In March 2018 he developed episodes of bronchitis.  Since that time his rhythm has been irregular.  He wore [name] Holter monitor which showed atrial fibrillation with [name] at 52 at 4:02 AM was sleeping and maximum rate at 119.  In the afternoon with an average rate of 82 bpm.  There are frequent PVCs, rare couplets, occasional episodes of bigeminy and trigeminy but no episodes of VT.  There were no pauses.  Previously, his blood pressure had become low and hehad been on losartan and I saw him one month ago there was leg swelling, left greater than right.  He had [name] chest x-ray with [name] [name] on 06/21/2016.  [name] carotid Doppler study in April 2018 showed improvement in his velocities such that his right carotid endarterectomy was patent with velocities now suggestive of 40-59% stenosis.  The left internal carotid velocities suggested [name] 1-39% stenosis.   When I saw him in May 2018, he was in atrial fibrillation.  I discontinued Plavix and started him on eliquis 5 mg twice [name].  I recommended that he not to take aspirin with his GI history.  I increased amiodarone to 200  mg twice [name] and reduced his Ranexa dose to 500 mg twice [name] with concomitant therapy.  He was having lower extremity edema.  [name] Doppler study was negative for DVT by his primary physician.  I recommended support stockings.    I saw him 1 month later and he continued to be in atrial fibrillation.  At that time, we discussed potential DC cardioversion for restoration of sinus rhythm.  He underwent repeat echo on 08/24/2016 which showed an EF of 55-60%.  His left atrium was normal in size and his right atrium was mildly dilated.  PA pressure was increased at 48 mm.  There was moderate LVH. Marland Kitchen  He apparently was seen subsequent to that evaluation by [name] [name] and was having 2+ bilateral lower extremity edema.  Further discussion concerning cardioversion was undertaken but the patient initially deferred at that point and wanted to that it further.  He tells me 1 month ago he noticed some bright red blood per rectum.  As result, he self reduced his eliquis from 5 mg twice [name] down to 5 mg daily.  Remotely, he has seen Dr. Scarlette Shorts in the  past.  His last colonoscopy was at least 5 years ago.  He had stopped taking amiodarone since he felt to contribute to fatigue, tremors, dizziness, and gait staggering.  The symptomshave improved off amiodarone antiarrhythmic therapy.  Her, he has noticed that at times his pulse can increase up to the low 100s with minimal activity.  He has now been using compression stockings for his ankle edema which has improved.    When I saw him in November 2018 , I recommended eliquis 5 mg twice [name] for full anticoagulation.  I started him on metoprolol 50 mg twice [name]  I discussed options concerning continuing permanent AF versus possible Tikosyn or AF ablation and referred him for  EP evaluation.  He saw Dr. Curt Bears in January 2019. Due to his QTC prolongation  he was not felt to be [name] candidate for Tikosyn or sotalol [name] and also was felt to be high risk for ablation.  As result he  recommended reiitiation of low-dose amiodarone.n 04/12/2017.  He underwent successful cardioversion.  When he saw [name] [name] on 04/19/2017  He had complaints of increasing dyspnea on exertion and orthopnea, which were similar to his previous symptoms on amiodarone.  When he last saw [name] [name] on May 02, 2017 he also complained of [name] fine tremor since institution of amiodarone, and amiodarone  was reduced to 100 mg every other day.   He underwent carotid duplex imaging which showed mild bilateral carotid plaque 40-59% on the right and 1-39% in the left.  In addition, an abdominal renal duplex study showed patent right renal artery stent, moderate left renal artery stenosis, and significant SMA stenosis.  This was reviewed by [name] [name].  When seen in March 2019 he felt his tremor and loss of coordination on the higher dose of amiodarone  improved with the lower dose amiodarone.  He was not taking Demadex for the past 10 days and this has resulted in increasing leg swelling.  Subsequently, he was seen in the atrial fibrillation clinic by Orson Eva several occasions, last seen Jul 14, 2017.  Prior to that evaluation he had seen his PCP for intermittent fevers.  When last seen in [name]. fib clinic he was in sinus rhythm. He denies any recurrent symptoms.  He continues to be on amiodarone 100 mg every other day and Eliquis for anticoagulation.  He is on metoprolol 75 mg twice [name], Ranexa 500 mg twice [name] and his dose was reduced when amiodarone was instituted.  He is on lovastatin 40 mg for hyperlipidemia.  He has been taking torsemide on an as-needed basis.  He has [name] prescription for diltiazem 30 mg to take if his heart rate increases above 100 and long as blood pressure is greater than 100.    When I saw him in September 2019 he was back in atrial fibrillation with ventricular rate at 76 bpm.  His right bundle branch block was stable and there was evidence for left posterior hemiblock.  Had only been  taking amiodarone 100 mg every other day and I suggested that he further titrate his back to 100 mg daily.  With this slightly higher dose he has noticed some slight increase in some balance and coordination issues.  He denies any anginal symptoms.  He has noted some leg swelling bilaterally.   Since I last saw him in November 2019, he has seen Dr. Curt Bears in January 2020.  He was not atrial fibrillation and has not maintained  sinus rhythm after his cardioversion.  At that time, he recommended discontinuance of amiodarone and that he continue in permanent atrial fibrillation.  At the end of February 2020 he developed increasing chest congestion, went to urgent care and was treated with antibiotics for 10 days.  Reportedly [name] chest x-ray was negative.  He has continued to have chest congestion.    He fractured his right hip in April and spent 4 days at [facility].  In May he underwent PV intervention by Dr. Ruta Hinds with stenting of his celiac artery and superior mesenteric artery due to high-grade stenoses and evidence for chronic mesenteric ischemia.  He developed [name] pseudoaneurysm which was successfully compressed on Jul 27, 2018.  He saw [name] [name] in June 2020 for progressive lower extremity edema who doubled his torsemide to twice [name].  Subsequently he apparently was given [name] prescription for metolazone by Dr. Virgina Norfolk to take on Monday Wednesday and Friday.     I saw him in June 2020 at which time his blood pressure was stable but he had been experiencing progressive lower extremity edema necessitating doubling of his torsemide dose to 20 twice [name] and initiation of metolazone by [name] [name].  He was not having any anginal symptoms.   He was last evaluated by [name] [name]  in [name] telemedicine visit for his peripheral vascular disease including his renal artery stenosis, and known bilateral SFA occlusions.  He was not having any lifestyle limiting claudication.  Blood pressure and renal function had  remained stable.  He had moderate right and mild to moderate left internal carotid stenoses with plan noninvasive annual monitoring.   I last saw him [name] telemedicine visit in December 2020.  He was without chest pain.  His atrial fibrillation rate has been controlled.  His leg swelling has improved and is better but at times there is still some mild lower extremity swelling.  He has been taking torsemide 2 tablets twice [name].  He has not required use of metolazone in over 3 weeks.  He will be undergoing laboratory at the end of December at the cancer center who has been following his hemoglobin closely.  He had required [name] transfusion at [facility] in late summer.    I saw him in July 2021.  Over the prior 14 months he was hospitalized on 7 occasions.  He underwent left to right femorofemoral bypass surgery by Dr. Oneida Alar on Jul 15, 1999.  He was readmitted to the hospital on Jul 22, 2019 due to [name] large fluid accumulation in the left groin along the tract of the graft.  He underwent evacuation of [name] lymphocele left main and placement of [name] negative pressure VAC dressing by Dr. Doren Custard on Jul 23, 2019.  Following revascularization, his chronic right leg pain had improved.  He was taken off Eliquis due to low hemoglobin.  He also underwent iron infusion therapy.  He has continued to experience 1-2+ pretibial edema bilaterally.  Apparently he had been on torsemide 40 mg twice [name] but this was switched by [name] [name] to just Lasix 40 mg daily.  He has chronic kidney disease with creatinine 1.58 May 2021.  I last saw him on January 22, 2020.  He had undergone an echocardiogram on Jul 09, 2019 which showed an EF of 50 to 55%.  There was mild LVH without wall motion abnormalities.  There was mild aortic stenosis with [name] mean gradient of 10.8 mm.  [name] carotid ultrasound on  December 20, 2019 was unchanged from his prior study with previously noted 40 to 59% stenosis in the right carotid and left carotid.  Right vertebral  flow was normal left subclavian flow was monophasic phasic consistent with known stenosis/occlusion.  When I saw him he was experiencing mild occasional chest discomfort which often occurred while eating breakfast.  He has taken sublingual nitroglycerin with relief.  Also if he had taken his morning Cardizem this would not occur.  He admits to residual leg swelling particularly left foot pretibial region.  He has been taking furosemide 40 mg twice [name].  His blood pressure has been stable on diltiazem 120 mg daily.  He continues to be on ranolazine 500 mg twice [name].  Approximately 1 time [name] month he takes 2.5 mg of metoprolol depending upon swelling.  He is on Zetia and lovastatin for hyperlipidemia.  He has restless legs and takes gabapentin 600 mg at 6 PM.  He walks with [name] walker he denies any chest discomfort while walking.  He admits to periods of increased heart rate which may increase to the low 100s.  His right foot wound has healed following his successful revascularization.   Since I saw him, he has been evaluated by [name] [name] last seen in December 2022 he was minimally symptomatic with his chronic left subclavian artery stenosis and/or occlusion with 40 mm blood pressure differential between the 2 arms.  With Dr. Oneida Alar retiring, I he saw Rodman Key abdomen PA-C at [facility] in December 2022.  He has not yet seen [name] surgeon [name] surgeon in place of Dr. Oneida Alar retirement.  He has been evaluated by Dr. Curt Bears with his atrial fibrillation and last saw him on June 14, 2021.  With his iliofemoral bypass he is not [name] candidate for the Watchman device.  He had GI bleeds on anticoagulation and is not on anticoagulation therapy.  Presently, he he denies any angina with walking.  He has noticed some chest pain with eating that is nitrate responsive raising concern for possible esophageal spasm etiology rather than ischemic CAD.  He is followed by [name] [name] at [facility] who checked laboratory recently.  He  has not yet seen Dr. Trula Slade who will assume Dr. Oneida Alar care.  He presents for follow-up cardiology evaluation.  Past Medical History:  Diagnosis Date   Anemia    Angina    Aortic stenosis    Atrial fibrillation ([place])    Blood transfusion    Bronchitis    Bulging discs    "8 of them; thoracic, lumbar, sacral area"   CHF (congestive heart failure) (HCC)    Chronic back pain    Chronic kidney disease    "mild kidney disease"   Colon polyps    Coronary artery disease 01/24/11   Successful PCI long segmental stensois  vein graft to the CX marginal vessel-graft previously stented prox. 3.0x35mm TAXUS stent mid seg 3.0x12mm TAXUS stent now tandem stens of 3.0x70mm & 3.0x65mm placed with the seg. 50,80 & 99% stenosis reduced to 0%   DDD (degenerative disc disease)    Diastolic heart failure (HCC)    Dysrhythmia    "PAC's and PVC's"   Esophageal dilatation    "have had it done 7 times; last time ~ 2001"   Esophageal stricture    GERD (gastroesophageal reflux disease)    Headache(784.0)    Heart murmur    Hiatal hernia    Hyperlipidemia    Hypertension    Myocardial infarction (  [place]) 1991   Myocardial infarction [facility]) 2005   "had 3 heart attacks this year"   Paroxysmal atrial fibrillation ([place])    Pneumonia 2020   Pyloric stenosis    RBBB    Renal artery stenosis (HCC)    hhistory of right renal artery stenting and re-intervention for "in-stent restenosis.   S/P CABG (coronary artery bypass graft) [place]   Shortness of breath    "when I had the heart attacks"   Subclavian artery stenosis, left ([place])    Ulcer    "small; Dr. Henrene Pastor [name] it 02/2010"    Past Surgical History:  Procedure Laterality Date   ABDOMINAL AORTOGRAM W/LOWER EXTREMITY N/[name] 05/10/2019   Procedure: ABDOMINAL AORTOGRAM W/LOWER EXTREMITY;  Surgeon: Elam Dutch, MD;  Location: [place] [facility];  Service: Cardiovascular;  Laterality: N/[name];   ADENOIDECTOMY     "when  I was an infant"   APPENDECTOMY  6734   APPLICATION OF WOUND VAC Left 07/23/2019   Procedure: Application Of Wound Vac;  Surgeon: Angelia Mould, MD;  Location: [place];  Service: Vascular;  Laterality: Left;   BALLOON DILATION N/[name] 10/14/2018   Procedure: BALLOON DILATION;  Surgeon: Lavena Bullion, DO;  Location: [place];  Service: Gastroenterology;  Laterality: N/[name];   BIOPSY  07/26/2018   Procedure: BIOPSY;  Surgeon: Thornton Park, MD;  Location: [place];  Service: Gastroenterology;;   BIOPSY  10/14/2018   Procedure: BIOPSY;  Surgeon: Lavena Bullion, DO;  Location: [place];  Service: Gastroenterology;;   CARDIOVERSION N/[name] 04/17/2017   Procedure: CARDIOVERSION;  Surgeon: Jerline Pain, MD;  Location: [place];  Service: Cardiovascular;  Laterality: N/[name];   CAROTID ENDARTERECTOMY Right 1996   CE   COLONOSCOPY WITH PROPOFOL N/[name] 10/14/2018   Procedure: COLONOSCOPY WITH PROPOFOL;  Surgeon: Lavena Bullion, DO;  Location: [place];  Service: Gastroenterology;  Laterality: N/[name];   CORONARY ANGIOPLASTY WITH STENT PLACEMENT  01/24/11   "today makes [name] total of 9 stents"   CORONARY ARTERY BYPASS GRAFT  1984   CABG X 4   CORONARY ARTERY BYPASS GRAFT  1996   CABG X 4   ENDARTERECTOMY FEMORAL Right 07/15/2019   Procedure: RIGHT FEMORAL ENDARTARECTOMY WITH PROFUNDOPLASTY;  Surgeon: Elam Dutch, MD;  Location: [place];  Service: Vascular;  Laterality: Right;   ESOPHAGOGASTRODUODENOSCOPY (EGD) WITH PROPOFOL N/[name] 07/26/2018   Procedure: ESOPHAGOGASTRODUODENOSCOPY (EGD) WITH PROPOFOL;  Surgeon: Thornton Park, MD;  Location: [place];  Service: Gastroenterology;  Laterality: N/[name];   ESOPHAGOGASTRODUODENOSCOPY (EGD) WITH PROPOFOL N/[name] 10/14/2018   Procedure: ESOPHAGOGASTRODUODENOSCOPY (EGD) WITH PROPOFOL;  Surgeon: Lavena Bullion, DO;  Location: [place];  Service: Gastroenterology;  Laterality: N/[name];   EYE SURGERY  2009   detached retina in right eye    FEMORAL-FEMORAL BYPASS GRAFT Bilateral 07/15/2019   Procedure: LEFT TO RIGHT FEMORAL-FEMORAL ARTERY;  Surgeon: Elam Dutch, MD;  Location[facility] OR;  Service: Vascular;  Laterality: Bilateral;   FRACTURE SURGERY  07/03/2018   fractured right hip sx.   IR THORACENTESIS ASP PLEURAL SPACE W/IMG GUIDE  10/12/2018   LEFT HEART CATHETERIZATION WITH CORONARY/GRAFT ANGIOGRAM N/[name] 01/24/2011   Procedure: LEFT HEART CATHETERIZATION WITH Beatrix Fetters;  Surgeon: Troy Sine, MD;  Location: [facility] CATH LAB;  Service: Cardiovascular;  Laterality: N/[name];   PERIPHERAL VASCULAR INTERVENTION  07/16/2018   Procedure: PERIPHERAL VASCULAR INTERVENTION;  Surgeon: Elam Dutch, MD;  Location: [place] [facility];  Service: Cardiovascular;;  SMA and Celiac   pylondial cyst removal     RETINAL DETACHMENT SURGERY  04/2007   right   TONSILLECTOMY  1972   VISCERAL ANGIOGRAPHY N/[name] 07/16/2018   Procedure: VISCERAL ANGIOGRAPHY;  Surgeon: Elam Dutch, MD;  Location: [place] [facility];  Service: Cardiovascular;  Laterality: N/[name];   WOUND DEBRIDEMENT Left 07/23/2019   Procedure: INCISION AND DRAINAGE LEFT GROIN WOUND;  Surgeon: Angelia Mould, MD;  Location: [place];  Service: Vascular;  Laterality: Left;    Allergies  Allergen Reactions   Codeine Nausea And Vomiting   Sulfa Antibiotics Rash   Sulfonamide Derivatives Rash   Oxycodone Nausea Only    Current Outpatient Medications  Medication Sig Dispense Refill   aspirin EC 81 MG tablet Take 1 tablet (81 mg total) by mouth daily. 150 tablet 2   butalbital-aspirin-caffeine (FIORINAL) 50-325-40 MG capsule Take 1 capsule by mouth daily as needed for headache.      diltiazem (CARDIZEM CD) 120 MG 24 hr capsule TAKE 1 CAPSULE BY MOUTH EVERY DAY 90 capsule 3   esomeprazole (NEXIUM) 40 MG capsule TAKE 1 CAPSULE (40 MG TOTAL) BY MOUTH 2 (TWO) TIMES DAILY BEFORE [name] MEAL. 60 capsule 7   ezetimibe (ZETIA) 10 MG tablet TAKE 1 TABLET BY MOUTH EVERY DAY 90 tablet  3   fluticasone (FLONASE) 50 MCG/ACT nasal spray Place into both nostrils.     Gabapentin Enacarbil (HORIZANT) 600 MG TBCR Take 600 mg by mouth daily as needed (restless leg). (1700)     Ketotifen Fumarate (ALAWAY OP) Place 1 drop into the right eye daily. Uses before putting contact Lens in R eye     loratadine (CLARITIN) 10 MG tablet Take 10 mg by mouth daily as needed for allergies, rhinitis or itching.   3   lovastatin (MEVACOR) 40 MG tablet TAKE 1 TABLET BY MOUTH EVERYDAY AT BEDTIME 90 tablet 3   metolazone (ZAROXOLYN) 2.5 MG tablet Take 2.5 mg by mouth daily as needed (fluid retention).      metoprolol succinate (TOPROL-XL) 25 MG 24 hr tablet TAKE 1 TABLET BY MOUTH EVERYDAY AT BEDTIME 90 tablet 3   Multiple Vitamins-Minerals (MULTIVITAMINS THER. W/MINERALS) TABS Take 1 tablet by mouth every evening.     nitroGLYCERIN (NITROLINGUAL) 0.4 MG/SPRAY spray PLACE 1 SPRAY UNDER THE TONGUE EVERY 5 (FIVE) MINUTES X 3 DOSES AS NEEDED FOR CHEST PAIN. 0.4 g 2   Potassium Chloride ER 20 MEQ TBCR Take 20 mEq by mouth as needed (With Metolazone).     senna-docusate (SENOKOT-S) 8.6-50 MG tablet Take 1 tablet by mouth daily. 30 tablet 6   vitamin E 200 UNIT capsule Take 200 Units by mouth every evening.     Lidocaine (ZTLIDO) 1.8 % PTCH APPLY TOPICALLY TO THE AFFECTED AREA 1-2 TIMES DAILY, SEPARATED BY 12 HOURS (Patient not taking: Reported on 10/07/2021)     ranolazine (RANEXA) 500 MG 12 hr tablet Take 500 mg by mouth 2 (two) times daily. (Patient not taking: Reported on 10/07/2021)     sucralfate (CARAFATE) 1 GM/10ML suspension Take 10 mLs (1 g total) by mouth 4 (four) times daily -  with meals and at bedtime. (Patient not taking: Reported on 10/07/2021) 1200 mL 11   torsemide (DEMADEX) 20 MG tablet Take by mouth. (Patient not taking: Reported on 10/07/2021)     No current facility-administered medications for this visit.    Social History   Socioeconomic History   Marital status: Married    Spouse name: Not  on file   Number of children: Not on file   Years of education: Not on file   Highest education level: Not on file  Occupational History   Not on file  Tobacco Use   Smoking status: Former    Packs/day: 1.00    Years: 38.00    Total pack years: 38.00    Types: Cigarettes    Quit date: 02/09/2005    Years since quitting: 16.6   Smokeless tobacco: Never   Tobacco comments:    "quit smoking cigarrettes 2006"  Vaping Use   Vaping Use: Never used  Substance and Sexual Activity   Alcohol use: No    Alcohol/week: 0.0 standard drinks of alcohol   Drug use: No   Sexual activity: Not on file  Other Topics Concern   Not on file  Social History Narrative   Not on file   Social Determinants of Health   Financial Resource Strain: Low Risk  (07/18/2018)   Overall Financial Resource Strain (CARDIA)    Difficulty of Paying Living Expenses: Not hard at all  Food Insecurity: No Food Insecurity (07/18/2018)   Hunger Vital Sign    Worried About Running Out of Food in the Last Year: Never true    [place] in the Last Year: Never true  Transportation Needs: No Transportation Needs (07/18/2018)   PRAPARE - Hydrologist (Medical): No    Lack of Transportation (Non-Medical): No  Physical Activity: Inactive (07/18/2018)   Exercise Vital Sign    Days of Exercise per Week: 0 days    Minutes of Exercise per Session: 0 min  Stress: No Stress Concern Present (07/18/2018)   [place]    Feeling of Stress : Only [name] little  Social Connections: Unknown (07/18/2018)   Social Connection and Isolation Panel [NHANES]    Frequency of Communication with Friends and Family: Not on file    Frequency of Social Gatherings with Friends and Family: Not on file    Attends Religious Services: Not on file    Active Member of Clubs or Organizations: No    Attends Archivist Meetings: Never    Marital Status:  Married  Human resources officer Violence: Not At Risk (07/18/2018)   Humiliation, Afraid, Rape, and Kick questionnaire    Fear of Current or Ex-Partner: No    Emotionally Abused: No    Physically Abused: No    Sexually Abused: No   Socially he is married has 2 children 2 stepchildren. He quit tobacco in 2000.  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 Henrene Pastor; mesenteric ischemia status post stenting of the celiac and superior mesenteric arteries. GU: Occasional concentrated or  dark urine without blood Musculoskeletal: Negative; no myalgias, joint pain, or weakness  Hematologic/Oncology: Positive for anemia Endocrine: Negative; no heat/cold intolerance; no diabetes Neuro: Negative; no changes in balance, headaches Skin: Negative; No rashes or skin lesions Psychiatric: Negative; No behavioral problems, depression Sleep: Positive for restless legsy Other comprehensive 14 point system review is negative.   PE BP 125/68   Ht $R'5\' 5"'iY$  (1.651 m)   Wt 124 lb 9.6 oz (56.5 kg)   SpO2 96%   BMI 20.73 kg/m     Wt Readings from Last 3 Encounters:  10/07/21 124 lb 9.6 oz (56.5 kg)  06/14/21 126 lb 3.2 oz (57.2 kg)  02/25/21 130 lb 14.4 oz (59.4 kg)   General: Alert, oriented, no distress.  Bearded Skin: normal turgor, no rashes, warm and dry HEENT: Normocephalic, atraumatic. Pupils equal round and  reactive to light; sclera anicteric; extraocular muscles intact;  Nose without nasal septal hypertrophy Mouth/Parynx benign; Mallinpatti scale 3 Neck: No JVD, no carotid bruits; normal carotid upstroke Lungs: clear to ausculatation and percussion; no wheezing or rales Chest wall: without tenderness to palpitation Heart: PMI not displaced, irregular irregular consistent with his permanent atrial fibrillation, s1 s2 normal, 2/6 systolic murmur consistent with his mild aortic stenosis, no diastolic murmur, no rubs, gallops, thrills, or heaves Abdomen: soft, nontender; no hepatosplenomehaly, BS+; abdominal aorta nontender and not dilated by palpation. Back: no CVA tenderness Pulses 2+ Musculoskeletal: full range of motion, normal strength, no joint deformities Extremities: Trace left lower extremity edema, trace to 1+ right lower extremity edema.  No clubbing cyanosis, Homan's sign negative  Neurologic: grossly nonfocal; Cranial nerves grossly wnl Psychologic: Normal mood and affect    October 07, 2021 ECG (independently read by me): Atrial fibillation at 70; RBBB  January 22, 2020 ECG (independently read by me): Atrial fibrillation at 82 bpm, right bundle branch block, left posterior hemiblock small nondiagnostic inferior Q waves 3 and aVF,  July 2021 ECG (independently read by me): Atrial fibrillation at 76 bpm with isolated PVC.  Right bundle branch block with repolarization changes.  Small nondiagnostic inferior Q waves in 3 and aVF  April 2020 ECG (independently read by me): Atrial fibrillation at 96 bpm, right bundle branch block with repolarization changes.  May 25, 2018 ECG (independently read by me): Probable atrial flutter with 3-1 block and [name] ventricular rate at 85.  Bundle branch block with repolarization changes.    January 25, 2018 ECG (independently read by me): Atrial fibrillation at 77 bpm.  Right bundle branch block with repolarization changes.  QTc interval 506  ms.  November 22, 2017 ECG (independently read by me): Atrial fibrillation at 76 bpm.  Right bundle branch block with repolarization changes.  Left posterior hemiblock.  Nonspecific ST-T changes  May 2019 ECG (independently read by me): Normal sinus rhythm at 63 bpm.  Right bundle branch block with repolarization changes.  Nonspecific ST changes.  QTc interval 493 ms.    March 2019 ECG (independently read by me): normal sinus rhythm at 64 bpm.  Right bundle branch block with repolarization changes.  Left posterior hemiblock.  QTc interval 497 ms.    November 2018 ECG (independently read by me): Atrial flutter with variable block.  PVC.  Left posterior hemiblock.  Right bundle branch block.  June 2018 ECG (independently read by me): Atrial fibrillation with ventricular rate at 70 bpm.  Right bundle-branch block with repolarization changes.  Probable left posterior fascicular block.  Jul 21, 2016 ECG (independently read by me): atrial fibrillation with [name] controlled ventricular rate at 71 bpm.  Right  bundle branch block and left posterior hemiblock.  Small inferior Q waves.  November 2017 ECG (independently read by me): Sinus rhythm with [name] PVC.  Right bundle branch block and probable left posterior hemiblock.  Inferior Q waves with preserved R waves.  February 2017 ECG (independently read by me): normal sinus rhythm at 61 bpm. Probable left atrial enlargement. Right bundle branch block.  ECG (independently read by me): Sinus bradycardia 58 bpm.  Increased QTc interval 473 ms.  Right bundle-branch block.  May 2016 ECG (independently read by me): Sinus bradycardia 59 bpm with right bundle branch block.I ST-T changes.  QTc interval 475 ms.  PR interval 182 ms.  No ectopy.  February 2016 ECG (independently read by me) normal sinus rhythm at 63 bpm.  Right bundle-branch block.  Probable left posterior fascicular block.  PR interval 170 ms.  QTC interval 480 ms.  November 2015 ECG (independently read  by me): Normal sinus rhythm at 62 bpm.  Right bundle branch block.  Small nondiagnostic inferior Q waves.  Prior May 2015ECG : sinus rhythm at 58 beats per minute.  Right bundle branch block.  Prior December 2014 ECG: Sinus rhythm with sinus bradycardia 55 beats per minute. Right bundle branch block.  LABS:    Latest Ref Rng & Units 10/21/2020   12:00 AM 04/23/2020   12:00 AM 01/29/2020   10:10 AM  BMP  Glucose 65 - 99 mg/dL   88   BUN 4 - $R'21 31  28     27   'Pp$ Creatinine 0.6 - 1.3 2.1  1.9     1.66   BUN/Creat Ratio 10 - 24   16   Sodium 137 - 147 140  140     143   Potassium 3.4 - 5.3 3.9  4.1     4.2   Chloride 99 - 108 102  102     102   CO2 13 - $Re'22 28  29     25   'VfC$ Calcium 8.7 - 10.7 9.0  9.0     8.7      This result is from an external source.      Latest Ref Rng & Units 10/21/2020   12:00 AM 04/23/2020   12:00 AM 01/29/2020   10:10 AM  Hepatic Function  Total Protein 6.0 - 8.5 g/dL   6.5   Albumin 3.5 - 5.0 4.1  4.0     3.9   AST 14 - 40 33  24     23   ALT 10 - 40 $Re'13  14     12   'xDG$ Alk Phosphatase 25 - 125 115  146     151   Total Bilirubin 0.0 - 1.2 mg/dL   <0.2      This result is from an external source.      Latest Ref Rng & Units 04/22/2021   12:00 AM 10/21/2020   12:00 AM 04/23/2020   12:00 AM  CBC  WBC  7.8  6.6  7.5      Hemoglobin 13.5 - 17.5 13.3  13.1  12.4      Hematocrit 41 - 53 39  38  36      Platelets 150 - 399 180  172  158         This result is from an external source.   Lab Results  Component Value Date   MCV 100 ([name]) 04/23/2020   MCV 97 01/29/2020   MCV  99.6 07/24/2019   Lab Results  Component Value Date   TSH 0.964 01/29/2020    Lipid Panel     Component Value Date/Time   CHOL 132 01/29/2020 1010   TRIG 65 01/29/2020 1010   HDL 63 01/29/2020 1010   CHOLHDL 2.1 01/29/2020 1010   CHOLHDL 2.6 08/01/2014 0852   VLDL 18 08/01/2014 0852   LDLCALC 55 01/29/2020 1010     RADIOLOGY: No results [name].  IMPRESSION:  1. Coronary  artery disease involving coronary bypass graft of native heart with angina pectoris ([place])   2. Hx of CABG '84, '96, last PCI 2012   3. Permanent atrial fibrillation ([place])   4. PAD (peripheral artery disease) ([place])   5. Bilateral carotid artery stenosis   6. RBBB   7. Subclavian artery stenosis, left ([facility])   8. RAS (renal artery stenosis) ([place])   9. Chronic mesenteric ischemia ([place])   10. Hyperlipidemia with target LDL less than 70   11. Restless legs syndrome (RLS)      ASSESSMENT AND PLAN: Mr. Lum Stillinger is [name] 76year-old gentleman who has an extensive CAD/PVD history, and is 39 years status post his initial CABG revascularization surgery done in [place] in 1984 and 27 years status post his second CABG surgery in 1996. He has had multiple peripheral vascular procedures including carotid endarterectomy, renal stenting, and has documented SFA occlusion. His last catheterization/ intervention in 2012 demonstrated diffuse disease in the vein graft supplying the marginal vessel and 2 additional Taxus stents inserted commencing ostially and extending to the proximal portion of the recently placed mid prior stent. He has [name] known occluded graft to the RCA with [name] mid RCA occlusion with left to right collaterals. He also has subclavian steal. He does have mild hypokinesis inferolaterally in the basal inferior wall with an EF of 45% at that catheterization. He has chronic right bundle branch block.  Previously he had complained of mild chest anginal symptomatology typically when he starts exercise and particularly after eating. He had potential areas of myocardium which may be responsible for ischemia  Ranexa 500 mg twice [name] was added to his medical regimen and subsequently  titrated to 1000 mg twice [name] , which resolved his symptomatology.  Because of issues with atrial fibrillation and [name] trial of amiodarone therapy in the past, Ranexa dose was reduced due to concomitant medication.  In the past he did  not tolerate higher dose amiodarone which resulted in unsteadiness and balance issues.  He was taken off amiodarone.  He is now in permanent atrial fibrillation.  He was [name] to have chronic mesenteric ischemia and underwent successful stenting of both the celiac and superior mesenteric arteries by Dr. Ruta Hinds.  In 2021 he developed chronic ischemic right lower extremity extremity pain which improved following femorofemoral bypass graft surgery.  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  He has been in permanent atrial fibrillation.  He developed recurrent GI blood loss leading to discontinuance of anticoagulation.  He has been evaluated by Dr. Curt Bears and he is not felt to be [name] candidate for the Watchman device due to his prior iliofemoral surgery.  Presently, he denies any definitive anginal symptoms.  He admits to some chest pain with eating and he has taken sublingual nitroglycerin with benefit.  This also raises concern for possible esophageal spasm improved with nitrate treatment.  Dr. Oneida Alar has since retired.  He has not yet established with Dr. Trula Slade who he will be  establishing subsequent care.  He did see a PA at [facility] in December 2022.  He continues to be followed periodically by [name] [name].  His last echo showed mild aortic stenosis with EF of 50 to 55% and mean gradient of 11 mm.  Recent laboratory done by his primary provider on August 16, 2021 shows CKD stage IIIb with creatinine 1.91 which is slightly increased from November 2022 lab at 1.8, but improved from August 2022 lab at 2.1.  LFTs are normal.  Hemoglobin in February 2023 was 13.3 improved off Eliquis.  He will follow-up with [facility].  His atrial fibrillation rate is controlled on diltiazem 120 mg and metoprolol succinate 25 mg daily and he is not having any angina with also ranolazine 500 mg twice [name].  He has been most recently taking torsemide which has been increased to 40 mg twice [name] and takes metolazone 2.5  mg once [name] week for his edema.  He is on Zetia 10 mg and lovastatin 40 mg for hyperlipidemia with target LDL less than 70.  He continues to take gabapentin for his restless legs.  I will see him in 6 months for reevaluation or sooner as needed   Troy Sine, MD, [facility]  10/10/2021 3:10 PM

## 2021-10-08 ENCOUNTER — Telehealth: Payer: Self-pay | Admitting: Cardiovascular Disease

## 2021-10-08 NOTE — Telephone Encounter (Signed)
Called patient, advised that via avs, no change to medications.  Patient verbalized understanding.

## 2021-10-08 NOTE — Telephone Encounter (Signed)
Patient called wanting to confirm there were no changes to his medication as his After Visit Summary was not clear.

## 2021-10-09 DIAGNOSIS — M5137 Other intervertebral disc degeneration, lumbosacral region: Secondary | ICD-10-CM | POA: Diagnosis not present

## 2021-10-09 DIAGNOSIS — M47896 Other spondylosis, lumbar region: Secondary | ICD-10-CM | POA: Diagnosis not present

## 2021-10-09 DIAGNOSIS — Z8781 Personal history of (healed) traumatic fracture: Secondary | ICD-10-CM | POA: Diagnosis not present

## 2021-10-09 DIAGNOSIS — M549 Dorsalgia, unspecified: Secondary | ICD-10-CM | POA: Diagnosis not present

## 2021-10-10 ENCOUNTER — Encounter: Payer: Self-pay | Admitting: Cardiovascular Disease

## 2021-10-12 DIAGNOSIS — M549 Dorsalgia, unspecified: Secondary | ICD-10-CM | POA: Diagnosis not present

## 2021-10-12 DIAGNOSIS — G894 Chronic pain syndrome: Secondary | ICD-10-CM | POA: Diagnosis not present

## 2021-10-12 DIAGNOSIS — M5137 Other intervertebral disc degeneration, lumbosacral region: Secondary | ICD-10-CM | POA: Diagnosis not present

## 2021-10-15 ENCOUNTER — Other Ambulatory Visit: Payer: Self-pay | Admitting: Cardiovascular Disease

## 2021-10-19 ENCOUNTER — Ambulatory Visit (INDEPENDENT_AMBULATORY_CARE_PROVIDER_SITE_OTHER): Payer: Medicare PPO

## 2021-10-19 DIAGNOSIS — I739 Peripheral vascular disease, unspecified: Secondary | ICD-10-CM | POA: Diagnosis not present

## 2021-10-19 DIAGNOSIS — I6523 Occlusion and stenosis of bilateral carotid arteries: Secondary | ICD-10-CM

## 2021-10-19 DIAGNOSIS — I4510 Unspecified right bundle-branch block: Secondary | ICD-10-CM

## 2021-10-19 DIAGNOSIS — K551 Chronic vascular disorders of intestine: Secondary | ICD-10-CM | POA: Diagnosis not present

## 2021-10-19 DIAGNOSIS — I701 Atherosclerosis of renal artery: Secondary | ICD-10-CM

## 2021-10-19 DIAGNOSIS — I771 Stricture of artery: Secondary | ICD-10-CM | POA: Diagnosis not present

## 2021-10-19 LAB — ECHOCARDIOGRAM COMPLETE
AR max vel: 0.69 cm2
AV Area VTI: 0.78 cm2
AV Area mean vel: 0.69 cm2
AV Mean grad: 19 mmHg
AV Peak grad: 30.6 mmHg
Ao pk vel: 2.77 m/s
Area-P 1/2: 4.33 cm2
Calc EF: 47.9 %
P 1/2 time: 325 msec
S' Lateral: 3.3 cm
Single Plane A2C EF: 48.9 %
Single Plane A4C EF: 44.8 %

## 2021-10-20 NOTE — Progress Notes (Signed)
[name]  [address] [place],  [place]  [address] [phone]  Clinic Day:  10/21/2021  Referring physician: Garwin Brothers, MD  HISTORY OF PRESENT ILLNESS:  The patient is a 76 y.o. male with anemia secondary to renal insufficiency and iron deficiency.  In the past, IV Feraheme was effective in bolstering his iron stores and improving his hemoglobin.  He comes in today to reassess his iron and hemoglobin levels.  Overall, the patient claims to be doing fairly well.  He denies having increased fatigue or any overt forms of blood loss which concern him for a declining hemoglobin.  PHYSICAL EXAM:  Blood pressure (!) 114/56, pulse 76, temperature 98.5 F (36.9 C), resp. rate 16, height '5\' 5"'$  (1.651 m), weight 128 lb 9.6 oz (58.3 kg), SpO2 93 %. Wt Readings from Last 3 Encounters:  10/21/21 128 lb 9.6 oz (58.3 kg)  10/07/21 124 lb 9.6 oz (56.5 kg)  06/14/21 126 lb 3.2 oz (57.2 kg)   Body mass index is 21.4 kg/m. Performance status (ECOG): 1 - Symptomatic but completely ambulatory Physical Exam Constitutional:      Appearance: Normal appearance. He is ill-appearing (chronically ill appearance).     Comments: Ambulating with a walker  HENT:     Mouth/Throat:     Mouth: Mucous membranes are moist.     Pharynx: Oropharynx is clear. No oropharyngeal exudate or posterior oropharyngeal erythema.  Cardiovascular:     Rate and Rhythm: Normal rate and regular rhythm.     Heart sounds: No murmur heard.    No friction rub. No gallop.  Pulmonary:     Effort: Pulmonary effort is normal. No respiratory distress.     Breath sounds: Normal breath sounds. No wheezing, rhonchi or rales.  Abdominal:     General: Bowel sounds are normal. There is no distension.     Palpations: Abdomen is soft. There is no mass.     Tenderness: There is no abdominal tenderness.  Musculoskeletal:        General: No swelling.     Right lower leg: No edema.     Left lower leg: No  edema.  Lymphadenopathy:     Cervical: No cervical adenopathy.     Upper Body:     Right upper body: No supraclavicular or axillary adenopathy.     Left upper body: No supraclavicular or axillary adenopathy.     Lower Body: No right inguinal adenopathy. No left inguinal adenopathy.  Skin:    General: Skin is warm.     Coloration: Skin is not jaundiced.     Findings: No lesion or rash.  Neurological:     General: No focal deficit present.     Mental Status: He is alert and oriented to person, place, and time. Mental status is at baseline.  Psychiatric:        Mood and Affect: Mood normal.        Behavior: Behavior normal.        Thought Content: Thought content normal.    LABS:      Latest Ref Rng & Units 10/21/2021   12:00 AM 04/22/2021   12:00 AM 10/21/2020   12:00 AM  CBC  WBC  7.3     7.8  6.6   Hemoglobin 13.5 - 17.5 12.9     13.3  13.1   Hematocrit 41 - 53 37     39  38   Platelets 150 - 400 K/uL 171  180  172      This result is from an external source.      Latest Ref Rng & Units 10/21/2021   12:00 AM 10/21/2020   12:00 AM 04/23/2020   12:00 AM  CMP  BUN 4 - 21 36     31  28      Creatinine 0.6 - 1.3 1.9     2.1  1.9      Sodium 137 - 147 138     140  140      Potassium 3.5 - 5.1 mEq/L 3.9     3.9  4.1      Chloride 99 - 108 102     102  102      CO2 13 - '22 28     28  29      '$ Calcium 8.7 - 10.7 8.8     9.0  9.0      Alkaline Phos 25 - 125 98     115  146      AST 14 - 40 25     33  24      ALT 10 - 40 U/L '13     13  14         '$ This result is from an external source.    Latest Reference Range & Units 10/21/21 09:02  Iron 45 - 182 ug/dL 81  UIBC ug/dL 178  TIBC 250 - 450 ug/dL 259  Saturation Ratios 17.9 - 39.5 % 31  Ferritin 24 - 336 ng/mL 74   ASSESSMENT & PLAN:  Assessment/Plan:  A 76 y.o. male with anemia secondary to chronic renal insufficiency and iron deficiency.  I am pleased as his hemoglobin is satisfactory at 12.9.  Furthermore, his iron  parameters remain fine.  Clinically, the patient is doing well.  As that is the case, I will see him back in 6 months for repeat clinical assessment. The patient understands all the plans discussed today and is in agreement with them.    [name] Knipp Macarthur Critchley, MD

## 2021-10-21 ENCOUNTER — Inpatient Hospital Stay (INDEPENDENT_AMBULATORY_CARE_PROVIDER_SITE_OTHER): Payer: Medicare PPO | Admitting: Oncology

## 2021-10-21 ENCOUNTER — Inpatient Hospital Stay: Payer: Medicare PPO | Attending: Oncology

## 2021-10-21 ENCOUNTER — Other Ambulatory Visit: Payer: Self-pay | Admitting: Oncology

## 2021-10-21 ENCOUNTER — Telehealth: Payer: Self-pay | Admitting: Oncology

## 2021-10-21 VITALS — BP 114/56 | HR 76 | Temp 98.5°F | Resp 16 | Ht 65.0 in | Wt 128.6 lb

## 2021-10-21 DIAGNOSIS — D508 Other iron deficiency anemias: Secondary | ICD-10-CM | POA: Diagnosis not present

## 2021-10-21 DIAGNOSIS — D509 Iron deficiency anemia, unspecified: Secondary | ICD-10-CM | POA: Insufficient documentation

## 2021-10-21 DIAGNOSIS — Z79899 Other long term (current) drug therapy: Secondary | ICD-10-CM | POA: Insufficient documentation

## 2021-10-21 DIAGNOSIS — D649 Anemia, unspecified: Secondary | ICD-10-CM | POA: Diagnosis not present

## 2021-10-21 LAB — BASIC METABOLIC PANEL
BUN: 36 — AB (ref 4–21)
CO2: 28 — AB (ref 13–22)
Chloride: 102 (ref 99–108)
Creatinine: 1.9 — AB (ref 0.6–1.3)
Glucose: 105
Potassium: 3.9 mEq/L (ref 3.5–5.1)
Sodium: 138 (ref 137–147)

## 2021-10-21 LAB — CBC AND DIFFERENTIAL
HCT: 37 — AB (ref 41–53)
Hemoglobin: 12.9 — AB (ref 13.5–17.5)
Neutrophils Absolute: 5.33
Platelets: 171 10*3/uL (ref 150–400)
WBC: 7.3

## 2021-10-21 LAB — IRON AND TIBC
Iron: 81 ug/dL (ref 45–182)
Saturation Ratios: 31 % (ref 17.9–39.5)
TIBC: 259 ug/dL (ref 250–450)
UIBC: 178 ug/dL

## 2021-10-21 LAB — HEPATIC FUNCTION PANEL
ALT: 13 U/L (ref 10–40)
AST: 25 (ref 14–40)
Alkaline Phosphatase: 98 (ref 25–125)
Bilirubin, Total: 0.6

## 2021-10-21 LAB — CBC: RBC: 3.73 — AB (ref 3.87–5.11)

## 2021-10-21 LAB — FERRITIN: Ferritin: 74 ng/mL (ref 24–336)

## 2021-10-21 LAB — COMPREHENSIVE METABOLIC PANEL
Albumin: 4 (ref 3.5–5.0)
Calcium: 8.8 (ref 8.7–10.7)

## 2021-10-21 NOTE — Telephone Encounter (Signed)
Per 10/21/21 los next appt scheduled and confirmed with patient 

## 2021-10-22 DIAGNOSIS — J310 Chronic rhinitis: Secondary | ICD-10-CM | POA: Diagnosis not present

## 2021-10-22 DIAGNOSIS — J329 Chronic sinusitis, unspecified: Secondary | ICD-10-CM | POA: Diagnosis not present

## 2021-10-29 ENCOUNTER — Telehealth: Payer: Self-pay | Admitting: Cardiovascular Disease

## 2021-10-29 MED ORDER — NITROGLYCERIN 0.4 MG SL SUBL: 0.4000 mg | SUBLINGUAL_TABLET | SUBLINGUAL | 0 refills | Status: DC | PRN

## 2021-10-29 NOTE — Telephone Encounter (Signed)
Pt c/o medication issue:  1. Name of Medication: nitroGLYCERIN (NITROLINGUAL) 0.4 MG/SPRAY spray  2. How are you currently taking this medication (dosage and times per day)?   3. Are you having a reaction (difficulty breathing--STAT)?   4. What is your medication issue? Pt called stating that his pharmacy is having some troubles finding this spray and he wants to know if he can get a prescription for the pills instead.

## 2021-10-29 NOTE — Telephone Encounter (Signed)
Returned call to patient-patient states ntg spray is on backorder and request ntg SL tablets to be sent to pharmacy until spray is available.    Rx sent to pharmacy.

## 2021-11-04 DIAGNOSIS — B351 Tinea unguium: Secondary | ICD-10-CM | POA: Diagnosis not present

## 2021-11-04 DIAGNOSIS — Z9862 Peripheral vascular angioplasty status: Secondary | ICD-10-CM | POA: Diagnosis not present

## 2021-11-04 DIAGNOSIS — I739 Peripheral vascular disease, unspecified: Secondary | ICD-10-CM | POA: Diagnosis not present

## 2021-11-04 DIAGNOSIS — Z872 Personal history of diseases of the skin and subcutaneous tissue: Secondary | ICD-10-CM | POA: Diagnosis not present

## 2021-11-12 ENCOUNTER — Telehealth: Payer: Self-pay | Admitting: Cardiovascular Disease

## 2021-11-12 DIAGNOSIS — I200 Unstable angina: Secondary | ICD-10-CM

## 2021-11-12 DIAGNOSIS — K551 Chronic vascular disorders of intestine: Secondary | ICD-10-CM

## 2021-11-12 MED ORDER — RANOLAZINE ER 500 MG PO TB12: 500.0000 mg | ORAL_TABLET | Freq: Two times a day (BID) | ORAL | 2 refills | Status: DC

## 2021-11-12 NOTE — Telephone Encounter (Signed)
Pt c/o medication issue:  1. Name of Medication: ranolazine (RANEXA) 500 MG 12 hr tablet  2. How are you currently taking this medication (dosage and times per day)? 1/2 a tablet of 1000 Mg's daily  3. Are you having a reaction (difficulty breathing--STAT)? No   4. What is your medication issue? Patient is calling stating he is taking a 1/2 tablet of 1000 Mg daily. Cutting the tablets in half makes sharps edges irritating his aphagics as is goes down. Patient is wanting to know if he was prescribed 500 MG tablets to take once daily if it would cost him more. If not he would like a new prescription written and sent to his pharmacy. Please advise.

## 2021-11-12 NOTE — Telephone Encounter (Signed)
Do not know if price will be different but am happy to change the prescription for him

## 2021-11-15 DIAGNOSIS — E782 Mixed hyperlipidemia: Secondary | ICD-10-CM | POA: Diagnosis not present

## 2021-11-15 DIAGNOSIS — N1832 Chronic kidney disease, stage 3b: Secondary | ICD-10-CM | POA: Diagnosis not present

## 2021-11-15 DIAGNOSIS — I10 Essential (primary) hypertension: Secondary | ICD-10-CM | POA: Diagnosis not present

## 2021-11-15 DIAGNOSIS — I25708 Atherosclerosis of coronary artery bypass graft(s), unspecified, with other forms of angina pectoris: Secondary | ICD-10-CM | POA: Diagnosis not present

## 2021-11-15 DIAGNOSIS — I4821 Permanent atrial fibrillation: Secondary | ICD-10-CM | POA: Diagnosis not present

## 2021-11-15 NOTE — Telephone Encounter (Signed)
Pt informed of providers recommendations. Pt verbalized understanding. No further questions .  Pt states that his wife picked up this rx.

## 2021-11-16 ENCOUNTER — Telehealth: Payer: Self-pay | Admitting: Cardiovascular Disease

## 2021-11-16 NOTE — Telephone Encounter (Signed)
Patient informed of echo results per Dr. Claiborne Billings:  Normal LV function with EF 60 to 65%.  Mild LVH.  Moderate left atrial dilatation. Mild MR without mitral stenosis.  Moderate to severe aortic stenosis. He had no questions or concerns at this time. Reviewed cardiac meds with patient. Made 6 month f/u for 04/20/22.

## 2021-11-16 NOTE — Telephone Encounter (Signed)
Patient returned call for test results.  °

## 2021-11-18 DIAGNOSIS — H40011 Open angle with borderline findings, low risk, right eye: Secondary | ICD-10-CM | POA: Diagnosis not present

## 2021-11-18 DIAGNOSIS — H25813 Combined forms of age-related cataract, bilateral: Secondary | ICD-10-CM | POA: Diagnosis not present

## 2021-11-18 NOTE — Telephone Encounter (Signed)
Noted. Thanks.   Updated in result note.

## 2021-12-19 ENCOUNTER — Other Ambulatory Visit: Payer: Self-pay | Admitting: Gastroenterology

## 2021-12-20 ENCOUNTER — Other Ambulatory Visit: Payer: Self-pay | Admitting: Cardiovascular Disease

## 2022-01-05 NOTE — Progress Notes (Unsigned)
HISTORY AND PHYSICAL     CC:  follow up. Requesting Provider:  Garwin Brothers, MD  HPI: This is a 76 y.o. male who is here today for follow up for PAD.  Pt has hx of  left to right femoral to femoral bypass grafting 07/15/2019 by Dr. Oneida Alar and this was done for rest pain and ulcers on his right foot.  He states when he woke up, he could instantly tell a difference and his rest pain was much improved.  He has hx of celiac and SMA stenting in May 2020 also by Dr. Oneida Alar.  He has remote hx of CABG in combination with right CEA by Dr. Donnetta Hutching in 1996 and his carotid disease is followed by his cardiologist.  He had 40-59% bilateral ICA stenosis in October 2021.  He also has hx of CAD and MI with multiple stent placements.  He also has hx of detached retina of the right eye in 2009.  He had BLE GSV harvest for CABG.  He has permanent afib.  He was taken off Eliquis due to GIB.    Pt was last seen 02/25/2021 and at that time, he was without rest pain.  He did have an abrasion on the dorsal aspect of the right 2nd toe and he felt this was improving.  He was not having any post prandial pain or fear of food or weight loss.  He was ambulatory with a walker but his wife stated he was not getting around much.   His fem fem bypass was patent and ABI were unchanged from previous exam.  His SMA stent had stenosis but patent and pt was asymptomatic.  He was scheduled for 6 month follow up.  Since that visit, he did have carotid duplex with cardiology that revealed 1-39% right and 40-59% left ICA stenosis.   The pt returns today for follow up.  ***  The pt is on a statin for cholesterol management.    The pt is on an aspirin.    Other AC:  none The pt is on CCB, diuretic, BB for hypertension.  The pt does not have diabetes. Tobacco hx:  former  Pt does *** have family hx of AAA.  Past Medical History:  Diagnosis Date   Anemia    Angina    Aortic stenosis    Atrial fibrillation ([place])    Blood transfusion     Bronchitis    Bulging discs    "8 of them; thoracic, lumbar, sacral area"   CHF (congestive heart failure) (HCC)    Chronic back pain    Chronic kidney disease    "mild kidney disease"   Colon polyps    Coronary artery disease 01/24/11   Successful PCI long segmental stensois  vein graft to the CX marginal vessel-graft previously stented prox. 3.0x37m TAXUS stent mid seg 3.0x214mTAXUS stent now tandem stens of 3.0x3865m 3.0x8mm65maced with the seg. 50,80 & 99% stenosis reduced to 0%   DDD (degenerative disc disease)    Diastolic heart failure (HCC)[place] Dysrhythmia    "PAC's and PVC's"   Esophageal dilatation    "have had it done 7 times; last time ~ 2001"   Esophageal stricture    GERD (gastroesophageal reflux disease)    Headache(784.0)    Heart murmur    Hiatal hernia    Hyperlipidemia    Hypertension    [facility]   Myocardial infarction (HCC)[place]05   "had 3 heart  attacks this year"   Paroxysmal atrial fibrillation ([place])    Pneumonia 2020   Pyloric stenosis    RBBB    Renal artery stenosis (HCC)    hhistory of right renal artery stenting and re-intervention for "in-stent restenosis.   S/P CABG (coronary artery bypass graft) [place]   Shortness of breath    "when I had the heart attacks"   Subclavian artery stenosis, left ([place])    Ulcer    "small; Dr. Henrene Pastor found it 02/2010"    Past Surgical History:  Procedure Laterality Date   ABDOMINAL AORTOGRAM W/LOWER EXTREMITY N/A 05/10/2019   Procedure: ABDOMINAL AORTOGRAM W/LOWER EXTREMITY;  Surgeon: Elam Dutch, MD;  Location: [place] [facility];  Service: Cardiovascular;  Laterality: N/A;   ADENOIDECTOMY     "when I was an infant"   APPENDECTOMY  3086   APPLICATION OF WOUND VAC Left 07/23/2019   Procedure: Application Of Wound Vac;  Surgeon: Angelia Mould, MD;  Location: [place];  Service: Vascular;  Laterality: Left;   BALLOON DILATION N/A 10/14/2018    Procedure: BALLOON DILATION;  Surgeon: Lavena Bullion, DO;  Location: [place];  Service: Gastroenterology;  Laterality: N/A;   BIOPSY  07/26/2018   Procedure: BIOPSY;  Surgeon: Thornton Park, MD;  Location: [place];  Service: Gastroenterology;;   BIOPSY  10/14/2018   Procedure: BIOPSY;  Surgeon: Lavena Bullion, DO;  Location: [place];  Service: Gastroenterology;;   CARDIOVERSION N/A 04/17/2017   Procedure: CARDIOVERSION;  Surgeon: Jerline Pain, MD;  Location: [place];  Service: Cardiovascular;  Laterality: N/A;   CAROTID ENDARTERECTOMY Right 1996   CE   COLONOSCOPY WITH PROPOFOL N/A 10/14/2018   Procedure: COLONOSCOPY WITH PROPOFOL;  Surgeon: Lavena Bullion, DO;  Location: [place];  Service: Gastroenterology;  Laterality: N/A;   CORONARY ANGIOPLASTY WITH STENT PLACEMENT  01/24/11   "today makes a total of 9 stents"   CORONARY ARTERY BYPASS GRAFT  1984   CABG X 4   CORONARY ARTERY BYPASS GRAFT  1996   CABG X 4   ENDARTERECTOMY FEMORAL Right 07/15/2019   Procedure: RIGHT FEMORAL ENDARTARECTOMY WITH PROFUNDOPLASTY;  Surgeon: Elam Dutch, MD;  Location: [place];  Service: Vascular;  Laterality: Right;   ESOPHAGOGASTRODUODENOSCOPY (EGD) WITH PROPOFOL N/A 07/26/2018   Procedure: ESOPHAGOGASTRODUODENOSCOPY (EGD) WITH PROPOFOL;  Surgeon: Thornton Park, MD;  Location: [place];  Service: Gastroenterology;  Laterality: N/A;   ESOPHAGOGASTRODUODENOSCOPY (EGD) WITH PROPOFOL N/A 10/14/2018   Procedure: ESOPHAGOGASTRODUODENOSCOPY (EGD) WITH PROPOFOL;  Surgeon: Lavena Bullion, DO;  Location: [place];  Service: Gastroenterology;  Laterality: N/A;   EYE SURGERY  2009   detached retina in right eye   FEMORAL-FEMORAL BYPASS GRAFT Bilateral 07/15/2019   Procedure: LEFT TO RIGHT FEMORAL-FEMORAL ARTERY;  Surgeon: Elam Dutch, MD;  [facility] OR;  Service: Vascular;  Laterality: Bilateral;   FRACTURE SURGERY  07/03/2018   fractured right hip sx.    IR THORACENTESIS ASP PLEURAL SPACE W/IMG GUIDE  10/12/2018   LEFT HEART CATHETERIZATION WITH CORONARY/GRAFT ANGIOGRAM N/A 01/24/2011   Procedure: LEFT HEART CATHETERIZATION WITH Beatrix Fetters;  Surgeon: Troy Sine, MD;  Location: [facility] C[facility];  Service: Cardiovascular;  Laterality: N/A;   PERIPHERAL VASCULAR INTERVENTION  07/16/2018   Procedure: PERIPHERAL VASCULAR INTERVENTION;  Surgeon: Elam Dutch, MD;  Location: [place] [facility];  Service: Cardiovascular;;  SMA and Celiac   pylondial cyst removal  RETINAL DETACHMENT SURGERY  04/2007   right   TONSILLECTOMY  1972   VISCERAL ANGIOGRAPHY N/A 07/16/2018   Procedure: VISCERAL ANGIOGRAPHY;  Surgeon: Elam Dutch, MD;  Location: [place] [facility];  Service: Cardiovascular;  Laterality: N/A;   WOUND DEBRIDEMENT Left 07/23/2019   Procedure: INCISION AND DRAINAGE LEFT GROIN WOUND;  Surgeon: Angelia Mould, MD;  Location: [place];  Service: Vascular;  Laterality: Left;    Allergies  Allergen Reactions   Codeine Nausea And Vomiting   Sulfa Antibiotics Rash   Sulfonamide Derivatives Rash   Oxycodone Nausea Only    Current Outpatient Medications  Medication Sig Dispense Refill   aspirin EC 81 MG tablet Take 1 tablet (81 mg total) by mouth daily. 150 tablet 2   butalbital-aspirin-caffeine (FIORINAL) 50-325-40 MG capsule Take 1 capsule by mouth daily as needed for headache.      diltiazem (CARDIZEM CD) 120 MG 24 hr capsule TAKE 1 CAPSULE BY MOUTH EVERY DAY 90 capsule 3   esomeprazole (NEXIUM) 40 MG capsule TAKE 1 CAPSULE (40 MG TOTAL) BY MOUTH 2 (TWO) TIMES DAILY BEFORE A MEAL. 60 capsule 7   ezetimibe (ZETIA) 10 MG tablet TAKE 1 TABLET BY MOUTH EVERY DAY 90 tablet 3   fluticasone (FLONASE) 50 MCG/ACT nasal spray Place into both nostrils.     Gabapentin Enacarbil (HORIZANT) 600 MG TBCR Take 600 mg by mouth daily as needed (restless leg). (1700)     Ketotifen Fumarate (ALAWAY OP) Place 1 drop into the right eye  daily. Uses before putting contact Lens in R eye     Lidocaine (ZTLIDO) 1.8 % PTCH APPLY TOPICALLY TO THE AFFECTED AREA 1-2 TIMES DAILY, SEPARATED BY 12 HOURS (Patient not taking: Reported on 10/07/2021)     loratadine (CLARITIN) 10 MG tablet Take 10 mg by mouth daily as needed for allergies, rhinitis or itching.   3   lovastatin (MEVACOR) 40 MG tablet TAKE 1 TABLET BY MOUTH EVERYDAY AT BEDTIME 90 tablet 3   metolazone (ZAROXOLYN) 2.5 MG tablet Take 2.5 mg by mouth daily as needed (fluid retention).      metoprolol succinate (TOPROL-XL) 25 MG 24 hr tablet TAKE 1 TABLET BY MOUTH EVERYDAY AT BEDTIME 90 tablet 3   Multiple Vitamins-Minerals (MULTIVITAMINS THER. W/MINERALS) TABS Take 1 tablet by mouth every evening.     nitroGLYCERIN (NITROLINGUAL) 0.4 MG/SPRAY spray PLACE 1 SPRAY UNDER THE TONGUE EVERY 5 (FIVE) MINUTES X 3 DOSES AS NEEDED FOR CHEST PAIN. 0.4 g 2   nitroGLYCERIN (NITROSTAT) 0.4 MG SL tablet PLACE 1 TABLET UNDER THE TONGUE EVERY 5 MINUTES AS NEEDED. 25 tablet 0   Potassium Chloride ER 20 MEQ TBCR Take 20 mEq by mouth as needed (With Metolazone).     ranolazine (RANEXA) 500 MG 12 hr tablet Take 1 tablet (500 mg total) by mouth 2 (two) times daily. 60 tablet 2   senna-docusate (SENOKOT-S) 8.6-50 MG tablet Take 1 tablet by mouth daily. 30 tablet 6   sucralfate (CARAFATE) 1 GM/10ML suspension Take 10 mLs (1 g total) by mouth 4 (four) times daily -  with meals and at bedtime. (Patient not taking: Reported on 10/07/2021) 1200 mL 11   torsemide (DEMADEX) 20 MG tablet Take by mouth. (Patient not taking: Reported on 10/07/2021)     vitamin E 200 UNIT capsule Take 200 Units by mouth every evening.     No current facility-administered medications for this visit.    Family History  Problem Relation Age of Onset  Breast cancer Paternal Aunt    Diabetes Sister    Ulcers Father    Heart disease Father    Heart failure Father    Heart attack Father    AAA (abdominal aortic aneurysm) Father     Stroke Mother    Hypertension Mother    Colon cancer Neg Hx     Social History   Socioeconomic History   Marital status: Married    Spouse name: Not on file   Number of children: Not on file   Years of education: Not on file   Highest education level: Not on file  Occupational History   Not on file  Tobacco Use   Smoking status: Former    Packs/day: 1.00    Years: 38.00    Total pack years: 38.00    Types: Cigarettes    Quit date: 02/09/2005    Years since quitting: 16.9   Smokeless tobacco: Never   Tobacco comments:    "quit smoking cigarrettes 2006"  Vaping Use   Vaping Use: Never used  Substance and Sexual Activity   Alcohol use: No    Alcohol/week: 0.0 standard drinks of alcohol   Drug use: No   Sexual activity: Not on file  Other Topics Concern   Not on file  Social History Narrative   Not on file   Social Determinants of Health   Financial Resource Strain: Low Risk  (07/18/2018)   Overall Financial Resource Strain (CARDIA)    Difficulty of Paying Living Expenses: Not hard at all  Food Insecurity: No Food Insecurity (07/18/2018)   Hunger Vital Sign    Worried About Running Out of Food in the Last Year: Never true    [place] in the Last Year: Never true  Transportation Needs: No Transportation Needs (07/18/2018)   PRAPARE - Hydrologist (Medical): No    Lack of Transportation (Non-Medical): No  Physical Activity: Inactive (07/18/2018)   Exercise Vital Sign    Days of Exercise per Week: 0 days    Minutes of Exercise per Session: 0 min  Stress: No Stress Concern Present (07/18/2018)   [place]    Feeling of Stress : Only a little  Social Connections: Unknown (07/18/2018)   Social Connection and Isolation Panel [NHANES]    Frequency of Communication with Friends and Family: Not on file    Frequency of Social Gatherings with Friends and Family: Not on file     Attends Religious Services: Not on file    Active Member of Clubs or Organizations: No    Attends Archivist Meetings: Never    Marital Status: Married  Human resources officer Violence: Not At Risk (07/18/2018)   Humiliation, Afraid, Rape, and Kick questionnaire    Fear of Current or Ex-Partner: No    Emotionally Abused: No    Physically Abused: No    Sexually Abused: No     REVIEW OF SYSTEMS:  *** '[X]'$  denotes positive finding, '[ ]'$  denotes negative finding Cardiac  Comments:  Chest pain or chest pressure:    Shortness of breath upon exertion:    Short of breath when lying flat:    Irregular heart rhythm:        Vascular    Pain in calf, thigh, or hip brought on by ambulation:    Pain in feet at night that wakes you up from your sleep:     Blood clot  in your veins:    Leg swelling:         Pulmonary    Oxygen at home:    Productive cough:     Wheezing:         Neurologic    Sudden weakness in arms or legs:     Sudden numbness in arms or legs:     Sudden onset of difficulty speaking or slurred speech:    Temporary loss of vision in one eye:     Problems with dizziness:         Gastrointestinal    Blood in stool:     Vomited blood:         Genitourinary    Burning when urinating:     Blood in urine:        Psychiatric    Major depression:         Hematologic    Bleeding problems:    Problems with blood clotting too easily:        Skin    Rashes or ulcers:        Constitutional    Fever or chills:      PHYSICAL EXAMINATION:  ***  General:  WDWN in NAD; vital signs documented above Gait: Not observed HENT: WNL, normocephalic Pulmonary: normal non-labored breathing , without wheezing Cardiac: {Desc; regular/irreg:14544} HR, {With/Without:20273} carotid bruit*** Abdomen: soft, NT; aortic pulse is *** palpable Skin: {With/Without:20273} rashes Vascular Exam/Pulses:  Right Left  Radial {Exam; arterial pulse strength 0-4:30167} {Exam; arterial pulse  strength 0-4:30167}  Femoral {Exam; arterial pulse strength 0-4:30167} {Exam; arterial pulse strength 0-4:30167}  Popliteal {Exam; arterial pulse strength 0-4:30167} {Exam; arterial pulse strength 0-4:30167}  DP {Exam; arterial pulse strength 0-4:30167} {Exam; arterial pulse strength 0-4:30167}  PT {Exam; arterial pulse strength 0-4:30167} {Exam; arterial pulse strength 0-4:30167}  Peroneal *** ***   Extremities: {With/Without:20273} ischemic changes, {With/Without:20273} Gangrene , {With/Without:20273} cellulitis; {With/Without:20273} open wounds Musculoskeletal: no muscle wasting or atrophy  Neurologic: A&O X 3 Psychiatric:  The pt has {Desc; normal/abnormal:11317::"Normal"} affect.   Non-Invasive Vascular Imaging:   ABI's/TBI's on 01/06/2022: Right:  *** - Great toe pressure: *** Left:  *** - Great toe pressure: ***  Arterial duplex on 01/06/2022: ***  Mesenteric duplex 01/06/2022: ***  Previous ABI's/TBI's on 02/25/2021: Right:  0.67/0.27 - Great toe pressure: 27 Left:  0.87/0.43 - Great toe pressure:  43  Previous arterial duplex on 07/02/2021: Patent left to right femorofemoral bypass graft with inflow velocities suggestive of a >50% stenosis.  Previous mesenteric duplex on 07/02/2021: Suboptimal, limited exam. Increased velocity at the SMA suggesting > 70% stenosis. Celiac artery not well visualized    Previous carotid duplex on 10/19/2021: Right:  1-39% ICA stenosis Left:  40-59% ICA stenosis    ASSESSMENT/PLAN:: 76 y.o. male here for follow up for PAD with hx of  left to right femoral to femoral bypass grafting 07/15/2019 by Dr. Oneida Alar and this was done for rest pain and ulcers on his right foot.  He states when he woke up, he could instantly tell a difference and his rest pain was much improved.  He has hx of celiac and SMA stenting in May 2020 also by Dr. Oneida Alar.   PAD -*** -continue ***  Mesenteric ischemia -***  -continue asa/statin -pt will f/u in *** with  ***.   Leontine Locket, [facility] [phone]  Clinic MD:   Scot Dock

## 2022-01-06 ENCOUNTER — Ambulatory Visit (INDEPENDENT_AMBULATORY_CARE_PROVIDER_SITE_OTHER)
Admission: RE | Admit: 2022-01-06 | Discharge: 2022-01-06 | Disposition: A | Discharge status: Home / Self Care | Payer: Medicare PPO | Source: Ambulatory Visit | Attending: Physician Assistant | Admitting: Physician Assistant

## 2022-01-06 ENCOUNTER — Ambulatory Visit (HOSPITAL_COMMUNITY)
Admission: RE | Admit: 2022-01-06 | Discharge: 2022-01-06 | Disposition: A | Discharge status: Home / Self Care | Payer: Medicare PPO | Source: Ambulatory Visit | Attending: Physician Assistant | Admitting: Physician Assistant

## 2022-01-06 ENCOUNTER — Ambulatory Visit: Payer: Medicare PPO | Admitting: Physician Assistant

## 2022-01-06 VITALS — BP 116/62 | HR 62 | Temp 97.5°F | Resp 20 | Ht 65.0 in | Wt 127.5 lb

## 2022-01-06 DIAGNOSIS — I739 Peripheral vascular disease, unspecified: Secondary | ICD-10-CM

## 2022-01-06 DIAGNOSIS — K551 Chronic vascular disorders of intestine: Secondary | ICD-10-CM | POA: Diagnosis not present

## 2022-01-06 DIAGNOSIS — M7989 Other specified soft tissue disorders: Secondary | ICD-10-CM | POA: Diagnosis not present

## 2022-01-11 ENCOUNTER — Other Ambulatory Visit: Payer: Self-pay

## 2022-01-11 DIAGNOSIS — Z95828 Presence of other vascular implants and grafts: Secondary | ICD-10-CM

## 2022-01-11 DIAGNOSIS — I739 Peripheral vascular disease, unspecified: Secondary | ICD-10-CM

## 2022-01-14 ENCOUNTER — Other Ambulatory Visit: Payer: Self-pay

## 2022-01-14 ENCOUNTER — Telehealth: Payer: Self-pay | Admitting: Gastroenterology

## 2022-01-14 ENCOUNTER — Other Ambulatory Visit: Payer: Self-pay | Admitting: Gastroenterology

## 2022-01-14 MED ORDER — ESOMEPRAZOLE MAGNESIUM 40 MG PO CPDR: 40.0000 mg | DELAYED_RELEASE_CAPSULE | Freq: Two times a day (BID) | ORAL | 0 refills | Status: DC

## 2022-01-14 NOTE — Telephone Encounter (Signed)
Nexium 40 MG BID sent to [facility] for 30 days. Patient made aware.

## 2022-01-14 NOTE — Telephone Encounter (Signed)
Patient called requesting a refill for Nexium although he has not been seen since 2020 asked if he could please get one to hold him off until his appt 02/17/22. Will be out of it for about 2 weeks.

## 2022-01-31 ENCOUNTER — Ambulatory Visit: Payer: Medicare PPO | Admitting: Internal Medicine

## 2022-01-31 VITALS — BP 118/70 | HR 71 | Temp 97.8°F | Resp 16 | Ht 65.0 in | Wt 130.0 lb

## 2022-01-31 DIAGNOSIS — R29898 Other symptoms and signs involving the musculoskeletal system: Secondary | ICD-10-CM

## 2022-01-31 DIAGNOSIS — N1832 Chronic kidney disease, stage 3b: Secondary | ICD-10-CM

## 2022-01-31 DIAGNOSIS — I4821 Permanent atrial fibrillation: Secondary | ICD-10-CM | POA: Diagnosis not present

## 2022-01-31 DIAGNOSIS — I739 Peripheral vascular disease, unspecified: Secondary | ICD-10-CM | POA: Diagnosis not present

## 2022-01-31 DIAGNOSIS — Z23 Encounter for immunization: Secondary | ICD-10-CM

## 2022-01-31 NOTE — Progress Notes (Signed)
   Established Patient Office Visit  Subjective   Patient ID: [name], male    DOB: March 07, 1946  Age: 76 y.o. MRN: [id number]  Chief Complaint  Patient presents with   Follow-up    CKD    HPI 76 years old male is here for follow up. He has PAD s/p femorofemoral bypass and he follows with vascular surgeon. He also has CAD s/p CABG and follows with cardiologist. He also has AF and is not on anticoagulation due to fall risk. He also says he feel weak in his legs but no fall.    Review of Systems  Constitutional: Negative.   Cardiovascular:  Positive for chest pain.  Gastrointestinal: Negative.   Neurological:  Positive for weakness.      Objective:     BP 118/70 (BP Location: Right Arm, Patient Position: Sitting, Cuff Size: Normal)   Pulse 71   Temp 97.8 F (36.6 C) (Temporal)   Resp 16   Ht 5' 5"$  (1.651 m)   Wt 130 lb 0.6 oz (59 kg)   SpO2 93%   BMI 21.64 kg/m    Physical Exam Constitutional:      Appearance: He is ill-appearing.  HENT:     Head: Normocephalic and atraumatic.  Cardiovascular:     Rate and Rhythm: Normal rate and regular rhythm.     Heart sounds: Normal heart sounds.  Pulmonary:     Effort: Pulmonary effort is normal.     Breath sounds: Normal breath sounds.  Abdominal:     General: Bowel sounds are normal.     Palpations: Abdomen is soft.  Neurological:     General: No focal deficit present.     Mental Status: Mental status is at baseline.      No results found for any visits on 01/31/22.   The ASCVD Risk score (Arnett DK, et al., 2019) failed to calculate for the following reasons:   The patient has a prior MI or stroke diagnosis    Assessment & Plan:   Problem List Items Addressed This Visit       Cardiovascular and Mediastinum   Atrial fibrillation ([place]) - Primary (Chronic)   PAD (peripheral artery disease) (HCC)     Genitourinary   Stage 3b chronic kidney disease ([place])     Other   Weakness of both lower  extremities    Return in about 3 months (around 05/03/2022).    Garwin Brothers, MD

## 2022-02-01 ENCOUNTER — Other Ambulatory Visit: Payer: Self-pay

## 2022-02-01 MED ORDER — FLUTICASONE PROPIONATE 50 MCG/ACT NA SUSP: 1.0000 | Freq: Every day | NASAL | 6 refills | Status: AC

## 2022-02-02 LAB — CMP14 + ANION GAP
ALT: 7 IU/L (ref 0–44)
AST: 14 IU/L (ref 0–40)
Albumin/Globulin Ratio: 1.7 (ref 1.2–2.2)
Albumin: 4.3 g/dL (ref 3.8–4.8)
Alkaline Phosphatase: 109 IU/L (ref 44–121)
Anion Gap: 22 mmol/L — ABNORMAL HIGH (ref 10.0–18.0)
BUN/Creatinine Ratio: 18 (ref 10–24)
BUN: 36 mg/dL — ABNORMAL HIGH (ref 8–27)
Bilirubin Total: 0.2 mg/dL (ref 0.0–1.2)
CO2: 23 mmol/L (ref 20–29)
Calcium: 9 mg/dL (ref 8.6–10.2)
Chloride: 98 mmol/L (ref 96–106)
Creatinine, Ser: 2.01 mg/dL — ABNORMAL HIGH (ref 0.76–1.27)
Globulin, Total: 2.6 g/dL (ref 1.5–4.5)
Glucose: 95 mg/dL (ref 70–99)
Potassium: 3.7 mmol/L (ref 3.5–5.2)
Sodium: 143 mmol/L (ref 134–144)
Total Protein: 6.9 g/dL (ref 6.0–8.5)
eGFR: 34 mL/min/{1.73_m2} — ABNORMAL LOW (ref >59)

## 2022-02-03 ENCOUNTER — Other Ambulatory Visit: Payer: Self-pay | Admitting: Cardiovascular Disease

## 2022-02-06 ENCOUNTER — Other Ambulatory Visit: Payer: Self-pay | Admitting: Gastroenterology

## 2022-02-07 ENCOUNTER — Other Ambulatory Visit: Payer: Self-pay

## 2022-02-07 ENCOUNTER — Encounter (HOSPITAL_COMMUNITY): Payer: Self-pay

## 2022-02-07 ENCOUNTER — Telehealth: Payer: Self-pay | Admitting: Cardiovascular Disease

## 2022-02-07 ENCOUNTER — Emergency Department (HOSPITAL_COMMUNITY): Payer: Medicare PPO

## 2022-02-07 ENCOUNTER — Inpatient Hospital Stay (HOSPITAL_COMMUNITY)
Admission: EM | Admit: 2022-02-07 | Discharge: 2022-02-11 | DRG: 281 | Disposition: A | Discharge status: Home Health | Payer: Medicare PPO | Attending: Family Medicine | Admitting: Family Medicine

## 2022-02-07 ENCOUNTER — Other Ambulatory Visit: Payer: Self-pay | Admitting: Cardiovascular Disease

## 2022-02-07 DIAGNOSIS — D631 Anemia in chronic kidney disease: Secondary | ICD-10-CM | POA: Diagnosis present

## 2022-02-07 DIAGNOSIS — Z823 Family history of stroke: Secondary | ICD-10-CM

## 2022-02-07 DIAGNOSIS — K551 Chronic vascular disorders of intestine: Secondary | ICD-10-CM

## 2022-02-07 DIAGNOSIS — N1832 Chronic kidney disease, stage 3b: Secondary | ICD-10-CM | POA: Diagnosis present

## 2022-02-07 DIAGNOSIS — I252 Old myocardial infarction: Secondary | ICD-10-CM | POA: Diagnosis not present

## 2022-02-07 DIAGNOSIS — E785 Hyperlipidemia, unspecified: Secondary | ICD-10-CM | POA: Diagnosis not present

## 2022-02-07 DIAGNOSIS — I5030 Unspecified diastolic (congestive) heart failure: Secondary | ICD-10-CM | POA: Diagnosis present

## 2022-02-07 DIAGNOSIS — I200 Unstable angina: Secondary | ICD-10-CM

## 2022-02-07 DIAGNOSIS — R0789 Other chest pain: Secondary | ICD-10-CM | POA: Diagnosis not present

## 2022-02-07 DIAGNOSIS — I25119 Atherosclerotic heart disease of native coronary artery with unspecified angina pectoris: Secondary | ICD-10-CM | POA: Diagnosis present

## 2022-02-07 DIAGNOSIS — Z7982 Long term (current) use of aspirin: Secondary | ICD-10-CM

## 2022-02-07 DIAGNOSIS — I472 Ventricular tachycardia, unspecified: Secondary | ICD-10-CM | POA: Diagnosis not present

## 2022-02-07 DIAGNOSIS — K2970 Gastritis, unspecified, without bleeding: Secondary | ICD-10-CM | POA: Diagnosis present

## 2022-02-07 DIAGNOSIS — Z955 Presence of coronary angioplasty implant and graft: Secondary | ICD-10-CM

## 2022-02-07 DIAGNOSIS — I878 Other specified disorders of veins: Secondary | ICD-10-CM | POA: Diagnosis present

## 2022-02-07 DIAGNOSIS — I5032 Chronic diastolic (congestive) heart failure: Secondary | ICD-10-CM | POA: Diagnosis present

## 2022-02-07 DIAGNOSIS — Z1152 Encounter for screening for COVID-19: Secondary | ICD-10-CM

## 2022-02-07 DIAGNOSIS — Z515 Encounter for palliative care: Secondary | ICD-10-CM | POA: Diagnosis not present

## 2022-02-07 DIAGNOSIS — Z803 Family history of malignant neoplasm of breast: Secondary | ICD-10-CM

## 2022-02-07 DIAGNOSIS — Z79899 Other long term (current) drug therapy: Secondary | ICD-10-CM

## 2022-02-07 DIAGNOSIS — I959 Hypotension, unspecified: Secondary | ICD-10-CM | POA: Diagnosis present

## 2022-02-07 DIAGNOSIS — J9811 Atelectasis: Secondary | ICD-10-CM | POA: Diagnosis not present

## 2022-02-07 DIAGNOSIS — Z951 Presence of aortocoronary bypass graft: Secondary | ICD-10-CM | POA: Diagnosis not present

## 2022-02-07 DIAGNOSIS — R600 Localized edema: Secondary | ICD-10-CM | POA: Diagnosis not present

## 2022-02-07 DIAGNOSIS — Z8249 Family history of ischemic heart disease and other diseases of the circulatory system: Secondary | ICD-10-CM

## 2022-02-07 DIAGNOSIS — Z833 Family history of diabetes mellitus: Secondary | ICD-10-CM

## 2022-02-07 DIAGNOSIS — J90 Pleural effusion, not elsewhere classified: Secondary | ICD-10-CM | POA: Diagnosis not present

## 2022-02-07 DIAGNOSIS — I214 Non-ST elevation (NSTEMI) myocardial infarction: Secondary | ICD-10-CM | POA: Diagnosis not present

## 2022-02-07 DIAGNOSIS — I6529 Occlusion and stenosis of unspecified carotid artery: Secondary | ICD-10-CM | POA: Diagnosis present

## 2022-02-07 DIAGNOSIS — I10 Essential (primary) hypertension: Secondary | ICD-10-CM | POA: Diagnosis present

## 2022-02-07 DIAGNOSIS — I130 Hypertensive heart and chronic kidney disease with heart failure and stage 1 through stage 4 chronic kidney disease, or unspecified chronic kidney disease: Secondary | ICD-10-CM | POA: Diagnosis not present

## 2022-02-07 DIAGNOSIS — Z885 Allergy status to narcotic agent status: Secondary | ICD-10-CM

## 2022-02-07 DIAGNOSIS — I509 Heart failure, unspecified: Secondary | ICD-10-CM | POA: Diagnosis not present

## 2022-02-07 DIAGNOSIS — Z87891 Personal history of nicotine dependence: Secondary | ICD-10-CM | POA: Diagnosis not present

## 2022-02-07 DIAGNOSIS — Z7189 Other specified counseling: Secondary | ICD-10-CM | POA: Diagnosis not present

## 2022-02-07 DIAGNOSIS — I6523 Occlusion and stenosis of bilateral carotid arteries: Secondary | ICD-10-CM

## 2022-02-07 DIAGNOSIS — I452 Bifascicular block: Secondary | ICD-10-CM | POA: Diagnosis present

## 2022-02-07 DIAGNOSIS — K2100 Gastro-esophageal reflux disease with esophagitis, without bleeding: Secondary | ICD-10-CM | POA: Diagnosis present

## 2022-02-07 DIAGNOSIS — R000 Tachycardia, unspecified: Secondary | ICD-10-CM | POA: Diagnosis not present

## 2022-02-07 DIAGNOSIS — Z86010 Personal history of colonic polyps: Secondary | ICD-10-CM

## 2022-02-07 DIAGNOSIS — I350 Nonrheumatic aortic (valve) stenosis: Secondary | ICD-10-CM | POA: Diagnosis present

## 2022-02-07 DIAGNOSIS — Z882 Allergy status to sulfonamides status: Secondary | ICD-10-CM

## 2022-02-07 DIAGNOSIS — N5089 Other specified disorders of the male genital organs: Secondary | ICD-10-CM | POA: Diagnosis present

## 2022-02-07 DIAGNOSIS — I739 Peripheral vascular disease, unspecified: Secondary | ICD-10-CM | POA: Diagnosis not present

## 2022-02-07 DIAGNOSIS — Z888 Allergy status to other drugs, medicaments and biological substances status: Secondary | ICD-10-CM

## 2022-02-07 DIAGNOSIS — Z8711 Personal history of peptic ulcer disease: Secondary | ICD-10-CM

## 2022-02-07 DIAGNOSIS — J811 Chronic pulmonary edema: Secondary | ICD-10-CM | POA: Diagnosis not present

## 2022-02-07 DIAGNOSIS — I872 Venous insufficiency (chronic) (peripheral): Secondary | ICD-10-CM | POA: Diagnosis present

## 2022-02-07 DIAGNOSIS — I4821 Permanent atrial fibrillation: Secondary | ICD-10-CM | POA: Diagnosis present

## 2022-02-07 DIAGNOSIS — M7989 Other specified soft tissue disorders: Secondary | ICD-10-CM | POA: Diagnosis present

## 2022-02-07 DIAGNOSIS — R079 Chest pain, unspecified: Secondary | ICD-10-CM | POA: Diagnosis not present

## 2022-02-07 DIAGNOSIS — E876 Hypokalemia: Secondary | ICD-10-CM | POA: Diagnosis present

## 2022-02-07 LAB — CBC WITH DIFFERENTIAL/PLATELET
Abs Immature Granulocytes: 0.03 10*3/uL (ref 0.00–0.07)
Basophils Absolute: 0 10*3/uL (ref 0.0–0.1)
Basophils Relative: 0 %
Eosinophils Absolute: 0.1 10*3/uL (ref 0.0–0.5)
Eosinophils Relative: 1 %
HCT: 33 % — ABNORMAL LOW (ref 39.0–52.0)
Hemoglobin: 11.6 g/dL — ABNORMAL LOW (ref 13.0–17.0)
Immature Granulocytes: 0 %
Lymphocytes Relative: 5 %
Lymphs Abs: 0.5 10*3/uL — ABNORMAL LOW (ref 0.7–4.0)
MCH: 34.6 pg — ABNORMAL HIGH (ref 26.0–34.0)
MCHC: 35.2 g/dL (ref 30.0–36.0)
MCV: 98.5 fL (ref 80.0–100.0)
Monocytes Absolute: 0.4 10*3/uL (ref 0.1–1.0)
Monocytes Relative: 4 %
Neutro Abs: 9.4 10*3/uL — ABNORMAL HIGH (ref 1.7–7.7)
Neutrophils Relative %: 90 %
Platelets: 157 10*3/uL (ref 150–400)
RBC: 3.35 MIL/uL — ABNORMAL LOW (ref 4.22–5.81)
RDW: 13.2 % (ref 11.5–15.5)
WBC: 10.4 10*3/uL (ref 4.0–10.5)
nRBC: 0.2 % (ref 0.0–0.2)

## 2022-02-07 LAB — HEPARIN LEVEL (UNFRACTIONATED): Heparin Unfractionated: <0.1 IU/mL — ABNORMAL LOW (ref 0.30–0.70)

## 2022-02-07 LAB — COMPREHENSIVE METABOLIC PANEL
ALT: 11 U/L (ref 0–44)
AST: 19 U/L (ref 15–41)
Albumin: 3.2 g/dL — ABNORMAL LOW (ref 3.5–5.0)
Alkaline Phosphatase: 80 U/L (ref 38–126)
Anion gap: 7 (ref 5–15)
BUN: 41 mg/dL — ABNORMAL HIGH (ref 8–23)
CO2: 25 mmol/L (ref 22–32)
Calcium: 8.2 mg/dL — ABNORMAL LOW (ref 8.9–10.3)
Chloride: 106 mmol/L (ref 98–111)
Creatinine, Ser: 2.2 mg/dL — ABNORMAL HIGH (ref 0.61–1.24)
GFR, Estimated: 30 mL/min — ABNORMAL LOW (ref >60)
Glucose, Bld: 110 mg/dL — ABNORMAL HIGH (ref 70–99)
Potassium: 3.5 mmol/L (ref 3.5–5.1)
Sodium: 138 mmol/L (ref 135–145)
Total Bilirubin: 0.6 mg/dL (ref 0.3–1.2)
Total Protein: 6.1 g/dL — ABNORMAL LOW (ref 6.5–8.1)

## 2022-02-07 LAB — I-STAT VENOUS BLOOD GAS, ED
Acid-Base Excess: 2 mmol/L (ref 0.0–2.0)
Bicarbonate: 24.7 mmol/L (ref 20.0–28.0)
Calcium, Ion: 1.06 mmol/L — ABNORMAL LOW (ref 1.15–1.40)
HCT: 33 % — ABNORMAL LOW (ref 39.0–52.0)
Hemoglobin: 11.2 g/dL — ABNORMAL LOW (ref 13.0–17.0)
O2 Saturation: 91 %
Potassium: 3.5 mmol/L (ref 3.5–5.1)
Sodium: 139 mmol/L (ref 135–145)
TCO2: 26 mmol/L (ref 22–32)
pCO2, Ven: 33 mmHg — ABNORMAL LOW (ref 44–60)
pH, Ven: 7.483 — ABNORMAL HIGH (ref 7.25–7.43)
pO2, Ven: 57 mmHg — ABNORMAL HIGH (ref 32–45)

## 2022-02-07 LAB — RESP PANEL BY RT-PCR (FLU A&B, COVID) ARPGX2
Influenza A by PCR: NEGATIVE
Influenza B by PCR: NEGATIVE
SARS Coronavirus 2 by RT PCR: NEGATIVE

## 2022-02-07 LAB — BRAIN NATRIURETIC PEPTIDE: B Natriuretic Peptide: 601.5 pg/mL — ABNORMAL HIGH (ref 0.0–100.0)

## 2022-02-07 LAB — TROPONIN I (HIGH SENSITIVITY)
Troponin I (High Sensitivity): 546 ng/L (ref <18)
Troponin I (High Sensitivity): 1717 ng/L (ref <18)

## 2022-02-07 MED ORDER — ASPIRIN 81 MG PO TBEC
81.0000 mg | DELAYED_RELEASE_TABLET | Freq: Every day | ORAL | Status: DC
  Administered 2022-02-08 – 2022-02-11 (×4): 81 mg via ORAL
  Filled 2022-02-07 (×4): qty 1

## 2022-02-07 MED ORDER — ADULT MULTIVITAMIN W/MINERALS CH
1.0000 | ORAL_TABLET | Freq: Every evening | ORAL | Status: DC
  Administered 2022-02-08 – 2022-02-10 (×3): 1 via ORAL
  Filled 2022-02-07 (×3): qty 1

## 2022-02-07 MED ORDER — HEPARIN (PORCINE) 25000 UT/250ML-% IV SOLN
1150.0000 [IU]/h | INTRAVENOUS | Status: AC
  Administered 2022-02-07: 700 [IU]/h via INTRAVENOUS
  Administered 2022-02-08: 850 [IU]/h via INTRAVENOUS
  Administered 2022-02-09: 1150 [IU]/h via INTRAVENOUS
  Filled 2022-02-07 (×3): qty 250

## 2022-02-07 MED ORDER — SODIUM CHLORIDE 0.9% FLUSH
3.0000 mL | Freq: Two times a day (BID) | INTRAVENOUS | Status: DC
  Administered 2022-02-08 – 2022-02-11 (×4): 3 mL via INTRAVENOUS

## 2022-02-07 MED ORDER — METOLAZONE 2.5 MG PO TABS: 2.5000 mg | ORAL_TABLET | Freq: Every day | ORAL | Status: DC | PRN

## 2022-02-07 MED ORDER — EZETIMIBE 10 MG PO TABS
10.0000 mg | ORAL_TABLET | Freq: Every day | ORAL | Status: DC
  Administered 2022-02-08 – 2022-02-11 (×4): 10 mg via ORAL
  Filled 2022-02-07 (×4): qty 1

## 2022-02-07 MED ORDER — FUROSEMIDE 10 MG/ML IJ SOLN
80.0000 mg | Freq: Two times a day (BID) | INTRAMUSCULAR | Status: DC
  Administered 2022-02-07 – 2022-02-09 (×5): 80 mg via INTRAVENOUS
  Filled 2022-02-07 (×5): qty 8

## 2022-02-07 MED ORDER — FLUTICASONE PROPIONATE 50 MCG/ACT NA SUSP
1.0000 | Freq: Every day | NASAL | Status: DC
  Administered 2022-02-08 – 2022-02-11 (×4): 1 via NASAL
  Filled 2022-02-07: qty 16

## 2022-02-07 MED ORDER — PRAVASTATIN SODIUM 10 MG PO TABS
10.0000 mg | ORAL_TABLET | Freq: Every day | ORAL | Status: DC
  Administered 2022-02-08 – 2022-02-10 (×3): 10 mg via ORAL
  Filled 2022-02-07 (×3): qty 1

## 2022-02-07 MED ORDER — SODIUM CHLORIDE 0.9% FLUSH: 3.0000 mL | INTRAVENOUS | Status: DC | PRN

## 2022-02-07 MED ORDER — PANTOPRAZOLE SODIUM 40 MG PO TBEC
40.0000 mg | DELAYED_RELEASE_TABLET | Freq: Every day | ORAL | Status: DC
  Administered 2022-02-08 – 2022-02-11 (×4): 40 mg via ORAL
  Filled 2022-02-07 (×4): qty 1

## 2022-02-07 MED ORDER — ONDANSETRON HCL 4 MG/2ML IJ SOLN: 4.0000 mg | Freq: Four times a day (QID) | INTRAMUSCULAR | Status: DC | PRN

## 2022-02-07 MED ORDER — SENNOSIDES-DOCUSATE SODIUM 8.6-50 MG PO TABS
1.0000 | ORAL_TABLET | Freq: Every day | ORAL | Status: DC
  Administered 2022-02-08 – 2022-02-11 (×4): 1 via ORAL
  Filled 2022-02-07 (×4): qty 1

## 2022-02-07 MED ORDER — ONDANSETRON HCL 4 MG PO TABS: 4.0000 mg | ORAL_TABLET | Freq: Four times a day (QID) | ORAL | Status: DC | PRN

## 2022-02-07 MED ORDER — METOPROLOL SUCCINATE ER 25 MG PO TB24
25.0000 mg | ORAL_TABLET | Freq: Every day | ORAL | Status: DC
  Administered 2022-02-08 – 2022-02-09 (×2): 25 mg via ORAL
  Filled 2022-02-07 (×2): qty 1

## 2022-02-07 MED ORDER — POTASSIUM CHLORIDE CRYS ER 20 MEQ PO TBCR: 20.0000 meq | EXTENDED_RELEASE_TABLET | Freq: Every day | ORAL | Status: DC

## 2022-02-07 MED ORDER — DUTASTERIDE 0.5 MG PO CAPS
0.5000 mg | ORAL_CAPSULE | Freq: Every day | ORAL | Status: DC
  Administered 2022-02-08 – 2022-02-11 (×4): 0.5 mg via ORAL
  Filled 2022-02-07 (×4): qty 1

## 2022-02-07 MED ORDER — ALBUTEROL SULFATE (2.5 MG/3ML) 0.083% IN NEBU: 2.5000 mg | INHALATION_SOLUTION | Freq: Four times a day (QID) | RESPIRATORY_TRACT | Status: DC | PRN

## 2022-02-07 MED ORDER — ASPIRIN 325 MG PO TABS
325.0000 mg | ORAL_TABLET | Freq: Once | ORAL | Status: DC
  Filled 2022-02-07: qty 1

## 2022-02-07 MED ORDER — GABAPENTIN 300 MG PO CAPS: 300.0000 mg | ORAL_CAPSULE | Freq: Two times a day (BID) | ORAL | Status: DC | PRN

## 2022-02-07 MED ORDER — HEPARIN BOLUS VIA INFUSION
3500.0000 [IU] | Freq: Once | INTRAVENOUS | Status: AC
  Administered 2022-02-07: 3500 [IU] via INTRAVENOUS
  Filled 2022-02-07: qty 3500

## 2022-02-07 MED ORDER — ISOSORBIDE MONONITRATE ER 30 MG PO TB24
30.0000 mg | ORAL_TABLET | Freq: Every day | ORAL | Status: DC
  Administered 2022-02-07 – 2022-02-11 (×5): 30 mg via ORAL
  Filled 2022-02-07 (×5): qty 1

## 2022-02-07 MED ORDER — DILTIAZEM HCL ER COATED BEADS 120 MG PO CP24
120.0000 mg | ORAL_CAPSULE | Freq: Every day | ORAL | Status: DC
  Administered 2022-02-08 – 2022-02-11 (×4): 120 mg via ORAL
  Filled 2022-02-07 (×4): qty 1

## 2022-02-07 MED ORDER — SODIUM CHLORIDE 0.9 % IV SOLN: 250.0000 mL | INTRAVENOUS | Status: DC | PRN

## 2022-02-07 MED ORDER — NITROGLYCERIN 0.4 MG SL SUBL: 0.4000 mg | SUBLINGUAL_TABLET | SUBLINGUAL | Status: DC | PRN

## 2022-02-07 MED ORDER — FUROSEMIDE 10 MG/ML IJ SOLN
40.0000 mg | Freq: Once | INTRAMUSCULAR | Status: AC
  Administered 2022-02-07: 40 mg via INTRAVENOUS
  Filled 2022-02-07: qty 4

## 2022-02-07 MED ORDER — RANOLAZINE ER 500 MG PO TB12
500.0000 mg | ORAL_TABLET | Freq: Two times a day (BID) | ORAL | Status: DC
  Administered 2022-02-07 – 2022-02-11 (×8): 500 mg via ORAL
  Filled 2022-02-07 (×8): qty 1

## 2022-02-07 NOTE — Telephone Encounter (Signed)
Pt c/o of Chest Pain: STAT if CP now or developed within 24 hours  1. Are you having CP right now?  Yes   2. Are you experiencing any other symptoms (ex. SOB, nausea, vomiting, sweating)?  Arm pain, SOB, HR got up to 113  3. How long have you been experiencing CP?  About 1 week, gradually increasing   4. Is your CP continuous or coming and going?  Coming and going   5. Have you taken Nitroglycerin?  Patient's wife states nitro is not helping  ?

## 2022-02-07 NOTE — Progress Notes (Signed)
ANTICOAGULATION CONSULT NOTE - Follow Up Consult  Pharmacy Consult for Heparin Indication: chest pain/ACS  Allergies  Allergen Reactions   Codeine Nausea And Vomiting   Sulfa Antibiotics Rash   Sulfonamide Derivatives Rash   Oxycodone Nausea Only    Patient Measurements: Height: '5\' 5"'$  (165.1 cm) Weight: 57.3 kg (126 lb 5.2 oz) IBW/kg (Calculated) : 61.5 Heparin Dosing Weight: 59 kg  Vital Signs: Temp: 97.4 F (36.3 C) (12/04 2223) Temp Source: Oral (12/04 2223) BP: 117/63 (12/04 2223) Pulse Rate: 79 (12/04 2223)  Labs: Recent Labs    02/07/22 1200 02/07/22 1237 02/07/22 2251  HGB 11.6* 11.2*  --   HCT 33.0* 33.0*  --   PLT 157  --   --   HEPARINUNFRC  --   --  <0.10*  CREATININE 2.20*  --   --   TROPONINIHS 546*  --   --      Estimated Creatinine Clearance: 22.8 mL/min (A) (by C-G formula based on SCr of 2.2 mg/dL (H)).   Medications:  Scheduled:   [START ON 02/08/2022] aspirin EC  81 mg Oral Daily   [START ON 02/08/2022] diltiazem  120 mg Oral Daily   [START ON 02/08/2022] dutasteride  0.5 mg Oral Daily   [START ON 02/08/2022] ezetimibe  10 mg Oral Daily   [START ON 02/08/2022] fluticasone  1 spray Each Nare Daily   furosemide  80 mg Intravenous BID   isosorbide mononitrate  30 mg Oral Daily   [START ON 02/08/2022] metoprolol succinate  25 mg Oral Daily   [START ON 02/08/2022] multivitamin with minerals  1 tablet Oral QPM   [START ON 02/08/2022] pantoprazole  40 mg Oral Daily   [START ON 02/08/2022] potassium chloride SA  20 mEq Oral Daily   [START ON 02/08/2022] pravastatin  10 mg Oral q1800   [START ON 02/08/2022] ranolazine  500 mg Oral BID   [START ON 02/08/2022] senna-docusate  1 tablet Oral Daily   [START ON 02/08/2022] sodium chloride flush  3 mL Intravenous Q12H   Infusions:   sodium chloride     heparin 700 Units/hr (02/07/22 1532)    Assessment: 75 YOM presenting with chest pain and SOB. Not on anticoagulation prior to admission. Pharmacy consulted to  dose heparin.  Initial heparin level undetectable, there were line issues and heparin gtt was not running just prior to lab draw   Goal of Therapy:  Heparin level 0.3-0.7 units/ml Monitor platelets by anticoagulation protocol: Yes   Plan:  Continue heparin gtt at 700 units/hr and repeat heparin level  Bertis Ruddy, PharmD, [place] Pharmacist ED Pharmacist Phone # [phone] 02/07/2022 11:46 PM

## 2022-02-07 NOTE — Progress Notes (Signed)
ANTICOAGULATION CONSULT NOTE - Follow Up Consult  Pharmacy Consult for Heparin Indication: chest pain/ACS  Allergies  Allergen Reactions   Codeine Nausea And Vomiting   Sulfa Antibiotics Rash   Sulfonamide Derivatives Rash   Oxycodone Nausea Only    Patient Measurements: Height: '5\' 5"'$  (165.1 cm) Weight: 59 kg (130 lb) IBW/kg (Calculated) : 61.5 Heparin Dosing Weight: 59 kg  Vital Signs: Temp: 98.6 F (37 C) (12/04 0942) BP: 99/63 (12/04 1245) Pulse Rate: 77 (12/04 1245)  Labs: Recent Labs    02/07/22 1200 02/07/22 1237  HGB 11.6* 11.2*  HCT 33.0* 33.0*  PLT 157  --   CREATININE 2.20*  --   TROPONINIHS 546*  --     Estimated Creatinine Clearance: 23.5 mL/min (A) (by C-G formula based on SCr of 2.2 mg/dL (H)).   Medications:  Scheduled:   aspirin  325 mg Oral Once   Infusions:   Assessment: 3 YOM presenting with chest pain and SOB. Not on anticoagulation prior to admission. Pharmacy consulted to dose heparin.  CBC within normal limits. No concern for bleeding.   Goal of Therapy:  Heparin level 0.3-0.7 units/ml Monitor platelets by anticoagulation protocol: Yes   Plan:  Heparin 3500 units IV bolus once Start heparin 700 units/hr Check 8hr heparin level at 2300 Daily CBC and heparin level. Monitor for signs/symptoms of bleeding.  Merrilee Jansky, PharmD Clinical Pharmacist 02/07/2022,2:05 PM

## 2022-02-07 NOTE — H&P (Addendum)
[facility] History and Physical  [name] YDX:[id number] DOB: December 17, 1945 DOA: 02/07/2022  Referring physician: ED  PCP: Garwin Brothers, MD   Patient is coming from: Home  Chief Complaint: Chest pain  HPI:  Patient is a 76 years old male with past medical history of coronary artery disease status post CABG in 1984 followed by redo CABG in 1996 history of PCI, CKD stage III, hypertension, hyperlipidemia, persistent atrial fibrillation not on anticoagulation, history of carotid artery disease status post carotid endarterectomy 2005, renal artery stenosis status post stenting presented to hospital with complaints of chest pain that started this morning in the midsternal area with radiation to the back, Jaw which improved with nitroglycerin.  This was associated with some nausea and shortness of breath.  He had been having some similar chest pain over the last week or so.  He also complained of right foot redness and swelling.  Patient denies any urinary urgency, frequency, dysuria.  Denies any nausea, vomiting or diarrhea.  Denies any dizziness, lightheadedness or syncope.  Denies any fever, cough, chills or rigor but has some feeling of congestion in his lungs.  At baseline patient is able to ambulate with the help of walker.  Lives with his wife at home.  In the ED, patient was mildly hypotensive and was on 2 L of oxygen by nasal cannula.  Patient was noted to have elevated creatinine at 2.2 with elevated troponin at 546.  Chest x-ray showed increased right pleural effusion and superimposed pulmonary opacity.  EKG showed atrial fibrillation with right bundle branch block.  Assessment and plan.  Principal Problem:   NSTEMI (non-ST elevated myocardial infarction) ([place]) Active Problems:   RBBB (right bundle branch block with left anterior fascicular block)   Essential hypertension   PVD (peripheral vascular disease) (HCC)   Carotid stenosis   Hyperlipidemia LDL goal <72    Diastolic CHF (HCC)   Bilateral lower extremity edema   PAD (peripheral artery disease) (HCC)   Stage 3b chronic kidney disease (HCC)   Non-ST elevation MI.  History of multivessel coronary artery disease status post CABG.   Patient had chest pain pain and shortness of breath that improved with nitro.  EKG without any acute changes.  Patient does have underlying CKD stage III and had multiple cardiac and vascular intervention in the past.  Cardiology has seen the patient today and recommend 48 hours of heparin drip aspirin beta-blocker Cardizem Ranexa and Imdur.  Will follow cardiology recommendation.  Permanent A fib Not on anticoagulation due to previous GI bleed and anemia.  Continue beta-blocker and Cardizem.   Right foot swelling and erythema. PAD s/p left to right femorofemoral  bypass grafting 07/15/2019 Venous stasis and lymphedema. On high-dose Lasix.  Will continue to monitor intake and output charting Daily weights.  Leg elevation.  No leg pain.  Will get PT evaluation as well.  Hyperlipidemia. Continue statin and zetia    CKD IIIb Continue supportive care.   GERD/Gastritis/esophagitis  - per primary team   DVT Prophylaxis: Heparin subcu  Review of Systems:  All systems were reviewed and were negative unless otherwise mentioned in the HPI   Past Medical History:  Diagnosis Date   Anemia    Angina    Aortic stenosis    Atrial fibrillation ([place])    Blood transfusion    Bronchitis    Bulging discs    "8 of them; thoracic, lumbar, sacral area"   CHF (congestive heart failure) ([place])  Chronic back pain    Chronic kidney disease    "mild kidney disease"   Colon polyps    Coronary artery disease 01/24/11   Successful PCI long segmental stensois  vein graft to the CX marginal vessel-graft previously stented prox. 3.0x49m TAXUS stent mid seg 3.0x253mTAXUS stent now tandem stens of 3.0x3856m 3.0x8mm64maced with the seg. 50,80 & 99% stenosis reduced to 0%   DDD  (degenerative disc disease)    Diastolic heart failure (HCC)[place] Dysrhythmia    "PAC's and PVC's"   Esophageal dilatation    "have had it done 7 times; last time ~ 2001"   Esophageal stricture    GERD (gastroesophageal reflux disease)    Headache(784.0)    Heart murmur    Hiatal hernia    Hyperlipidemia    Hypertension    [facility]   Myocardial infarction (HCC)[place]05   "had 3 heart attacks this year"   Paroxysmal atrial fibrillation (HCC)[place] Pneumonia 2020   Pyloric stenosis    RBBB    Renal artery stenosis (HCC)    hhistory of right renal artery stenting and re-intervention for "in-stent restenosis.   S/P CABG (coronary artery bypass graft) [place]    "when I had the heart attacks"   Subclavian artery stenosis, left (HCC)[place] Ulcer    "small; Dr. PerrHenrene Pastornd it 02/2010"   Past Surgical History:  Procedure Laterality Date   ABDOMINAL AORTOGRAM W/LOWER EXTREMITY N/A 05/10/2019   Procedure: ABDOMINAL AORTOGRAM W/LOWER EXTREMITY;  Surgeon: FielElam Dutch;  [facility];  Service: Cardiovascular;  Laterality: N/A;   ADENOIDECTOMY     "when I was an infant"   APPENDECTOMY  [id number] OF WOUND VAC Left 07/23/2019   Procedure: Application Of Wound Vac;  Surgeon: DickAngelia Mould;  [facility] [place];  Laterality: Left;   BALLOON DILATION N/A 10/14/2018   Procedure: BALLOON DILATION;  Surgeon: CiriLavena Bullion;  [facility];  Laterality: N/A;   BIOPSY  07/26/2018   Procedure: BIOPSY;  Surgeon: BeavThornton Park;  [facility] [place] [facility];;   BIOPSY  10/14/2018   Procedure: BIOPSY;  Surgeon: CiriLavena Bullion;  [facility] [place];;   CARDIOVERSION N/A 04/17/2017   Procedure: CARDIOVERSION;  Surgeon: SkaiJerline Pain;  [facility]  [place];  Laterality: N/A;   CAROTID ENDARTERECTOMY Right 1996   CE   COLONOSCOPY WITH PROPOFOL N/A 10/14/2018   Procedure: COLONOSCOPY WITH PROPOFOL;  Surgeon: CiriLavena Bullion;  [facility] [place];  Laterality: N/A;   CORONARY ANGIOPLASTY WITH STENT PLACEMENT  01/24/11   "today makes a total of 9 stents"   CORONARY ARTERY BYPASS GRAFT  1984   CABG X 4   CORONARY ARTERY BYPASS GRAFT  1996   CABG X 4   ENDARTERECTOMY FEMORAL Right 07/15/2019   Procedure: RIGHT FEMORAL ENDARTARECTOMY WITH PROFUNDOPLASTY;  Surgeon: FielElam Dutch;  [facility] [place];  Laterality: Right;   ESOPHAGOGASTRODUODENOSCOPY (EGD) WITH PROPOFOL N/A 07/26/2018   Procedure: ESOPHAGOGASTRODUODENOSCOPY (EGD) WITH PROPOFOL;  Surgeon: BeavThornton Park;  [facility];  Laterality: N/A;   ESOPHAGOGASTRODUODENOSCOPY (EGD) WITH PROPOFOL N/A 10/14/2018   Procedure: ESOPHAGOGASTRODUODENOSCOPY (EGD) WITH PROPOFOL;  Surgeon: Lavena Bullion, DO;  Location: [facility] ENDOSCOPY;  Service: Gastroenterology;  Laterality: N/A;   EYE SURGERY  2009   detached retina in right eye   FEMORAL-FEMORAL BYPASS GRAFT Bilateral 07/15/2019   Procedure: LEFT TO RIGHT FEMORAL-FEMORAL ARTERY;  Surgeon: Elam Dutch, MD;  Location: [facility] OR;  Service: Vascular;  Laterality: Bilateral;   FRACTURE SURGERY  07/03/2018   fractured right hip sx.   IR THORACENTESIS ASP PLEURAL SPACE W/IMG GUIDE  10/12/2018   LEFT HEART CATHETERIZATION WITH CORONARY/GRAFT ANGIOGRAM N/A 01/24/2011   Procedure: LEFT HEART CATHETERIZATION WITH Beatrix Fetters;  Surgeon: Troy Sine, MD;  Location: [facility] CATH LAB;  Service: Cardiovascular;  Laterality: N/A;   PERIPHERAL VASCULAR INTERVENTION  07/16/2018   Procedure: PERIPHERAL VASCULAR INTERVENTION;  Surgeon: Elam Dutch, MD;  Location: [place] CV LAB;  Service: Cardiovascular;;  SMA and  Celiac   pylondial cyst removal     RETINAL DETACHMENT SURGERY  04/2007   right   TONSILLECTOMY  1972   VISCERAL ANGIOGRAPHY N/A 07/16/2018   Procedure: VISCERAL ANGIOGRAPHY;  Surgeon: Elam Dutch, MD;  Location: [place] CV LAB;  Service: Cardiovascular;  Laterality: N/A;   WOUND DEBRIDEMENT Left 07/23/2019   Procedure: INCISION AND DRAINAGE LEFT GROIN WOUND;  Surgeon: Angelia Mould, MD;  Location: [facility] OR;  Service: Vascular;  Laterality: Left;    Social History:  reports that he quit smoking about 17 years ago. His smoking use included cigarettes. He has a 38.00 pack-year smoking history. He has never been exposed to tobacco smoke. He has never used smokeless tobacco. He reports that he does not drink alcohol and does not use drugs.  Allergies  Allergen Reactions   Codeine Nausea And Vomiting   Sulfa Antibiotics Rash   Sulfonamide Derivatives Rash   Oxycodone Nausea Only    Family History  Problem Relation Age of Onset   Breast cancer Paternal Aunt    Diabetes Sister    Ulcers Father    Heart disease Father    Heart failure Father    Heart attack Father    AAA (abdominal aortic aneurysm) Father    Stroke Mother    Hypertension Mother    Colon cancer Neg Hx      Prior to Admission medications   Medication Sig Start Date End Date Taking? Authorizing Provider  aspirin EC 81 MG tablet Take 1 tablet (81 mg total) by mouth daily. 04/11/19   Elam Dutch, MD  butalbital-aspirin-caffeine [facility]) [phone] MG capsule Take 1 capsule by mouth daily as needed for headache.  12/03/18   [provider]  diltiazem (CARDIZEM CD) 120 MG 24 hr capsule TAKE 1 CAPSULE BY MOUTH EVERY DAY 07/19/21   Troy Sine, MD  dutasteride (AVODART) 0.5 MG capsule Take 0.5 mg by mouth daily. 11/24/21   [provider]  esomeprazole (NEXIUM) 40 MG capsule TAKE 1 CAPSULE (40 MG TOTAL) BY MOUTH 2 (TWO) TIMES DAILY BEFORE A MEAL. 02/07/22 04/08/22  Cirigliano, Vito V, DO   ezetimibe (ZETIA) 10 MG tablet TAKE 1 TABLET BY MOUTH EVERY DAY Patient not taking: Reported on 01/06/2022 10/15/21   Troy Sine, MD  fluticasone [facility]) 50 MCG/ACT nasal spray Place 1 spray into both nostrils daily. 02/01/22   Garwin Brothers, MD  Gabapentin Enacarbil (HORIZANT) 600 MG TBCR Take 600 mg by mouth daily as needed (restless leg). (1700) 10/08/18   [provider]  Ketotifen Fumarate (ALAWAY OP) Place 1 drop into the right eye  daily. Uses before putting contact Lens in R eye    [provider]  KLOR-CON M20 20 MEQ tablet Take 20 mEq by mouth daily.    [provider]  Lidocaine (ZTLIDO) 1.8 % [facility]  09/04/20   [provider]  loratadine (CLARITIN) 10 MG tablet Take 10 mg by mouth daily as needed for allergies, rhinitis or itching.  12/12/13   [provider]  lovastatin (MEVACOR) 40 MG tablet TAKE 1 TABLET BY MOUTH EVERYDAY AT BEDTIME 11/23/20   Troy Sine, MD  metolazone (ZAROXOLYN) 2.5 MG tablet Take 2.5 mg by mouth daily as needed (fluid retention).  09/24/18   [provider]  metoprolol succinate (TOPROL-XL) 25 MG 24 hr tablet TAKE 1 TABLET BY MOUTH EVERYDAY AT BEDTIME 06/18/21   Camnitz, Ocie Doyne, MD  Multiple Vitamins-Minerals (MULTIVITAMINS THER. W/MINERALS) TABS Take 1 tablet by mouth every evening.    [provider]  nitroGLYCERIN (NITROLINGUAL) 0.4 MG/SPRAY spray PLACE 1 SPRAY UNDER THE TONGUE EVERY 5 (FIVE) MINUTES X 3 DOSES AS NEEDED FOR CHEST PAIN. 04/15/21   [name], MD  nitroGLYCERIN (NITROSTAT) 0.4 MG SL tablet PLACE 1 TABLET UNDER THE TONGUE EVERY 5 MINUTES AS NEEDED. 02/03/22   Troy Sine, MD  Potassium Chloride ER 20 MEQ TBCR Take 20 mEq by mouth as needed (With Metolazone). 05/30/19   [provider]  ranolazine (RANEXA) 500 MG 12 hr tablet Take 1 tablet (500 mg total) by mouth 2 (two) times daily. 11/12/21   Troy Sine, MD  senna-docusate (SENOKOT-S) 8.6-50 MG tablet Take 1  tablet by mouth daily. 09/05/18   Irene Shipper, MD  sucralfate (CARAFATE) 1 GM/10ML suspension Take 10 mLs (1 g total) by mouth 4 (four) times daily -  with meals and at bedtime. Patient not taking: Reported on 10/07/2021 09/05/18 06/26/20  Irene Shipper, MD  torsemide [facility]) 20 MG tablet Take by mouth. 11/20/20   [provider]  vitamin E 200 UNIT capsule Take 200 Units by mouth every evening.    [provider]    Physical Exam: Vitals:   02/07/22 1445 02/07/22 1530 02/07/22 1556 02/07/22 1615  BP: 113/63 117/67  130/69  Pulse: 78 88 (!) 34 86  Resp:  19  18  Temp:   98 F (36.7 C)   SpO2: 95% 98% 98% 96%  Weight:      Height:       Wt Readings from Last 3 Encounters:  02/07/22 59 kg  01/31/22 59 kg  01/06/22 57.8 kg   Body mass index is 21.63 kg/m.  General: Alert awake and oriented, not in obvious distress. HENT: Normocephalic, No scleral pallor or icterus noted. Oral mucosa is moist.  Chest: Coarse breath sounds noted bilaterally,  CVS: S1 &S2 heard..  Irregularly irregular rhythm Abdomen: Soft, nontender, nondistended.  Bowel sounds are heard. No abdominal mass palpated Extremities: No cyanosis, clubbing with bilateral lower extremity edema, more on the left, mild erythema bilaterally peripheral pulses are palpable. Psych: Alert, awake and oriented, normal mood CNS:  No cranial nerve deficits.  Power equal in all extremities.   Skin: Warm and dry.  lower extremity lymphedema mild erythema.  Labs on Admission:   CBC: Recent Labs  Lab 02/07/22 1200 02/07/22 1237  WBC 10.4  --   NEUTROABS 9.4*  --   HGB 11.6* 11.2*  HCT 33.0* 33.0*  MCV 98.5  --   PLT 157  --     Basic  Metabolic Panel: Recent Labs  Lab 02/07/22 1200 02/07/22 1237  NA 138 139  K 3.5 3.5  CL 106  --   CO2 25  --   GLUCOSE 110*  --   BUN 41*  --   CREATININE 2.20*  --   CALCIUM 8.2*  --     Liver Function Tests: Recent Labs  Lab 02/07/22 1200  AST 19  ALT 11   ALKPHOS 80  BILITOT 0.6  PROT 6.1*  ALBUMIN 3.2*   No results for input(s): "LIPASE", "AMYLASE" in the last 168 hours. No results for input(s): "AMMONIA" in the last 168 hours.  Cardiac Enzymes: No results for input(s): "CKTOTAL", "CKMB", "CKMBINDEX", "TROPONINI" in the last 168 hours.  BNP (last 3 results) Recent Labs    02/07/22 1200  BNP 601.5*    ProBNP (last 3 results) No results for input(s): "PROBNP" in the last 8760 hours.  CBG: No results for input(s): "GLUCAP" in the last 168 hours.  Lipase     Component Value Date/Time   LIPASE 10.0 (L) 08/06/2009 1601     Urinalysis    Component Value Date/Time   COLORURINE YELLOW 07/11/2019 1114   APPEARANCEUR CLEAR 07/11/2019 1114   LABSPEC 1.012 07/11/2019 1114   PHURINE 5.0 07/11/2019 1114   GLUCOSEU NEGATIVE 07/11/2019 1114   HGBUR NEGATIVE 07/11/2019 1114   [place] 07/11/2019 1114   KETONESUR NEGATIVE 07/11/2019 1114   PROTEINUR NEGATIVE 07/11/2019 1114   NITRITE NEGATIVE 07/11/2019 1114   LEUKOCYTESUR NEGATIVE 07/11/2019 1114     Drugs of Abuse  No results found for: "LABOPIA", "COCAINSCRNUR", "LABBENZ", "AMPHETMU", "THCU", "LABBARB"    Radiological Exams on Admission: DG Chest Port 1 View  Result Date: 02/07/2022 CLINICAL DATA:  Chest pain EXAM: PORTABLE CHEST 1 VIEW COMPARISON:  CR 11/29/18 FINDINGS: Postsurgical changes from median sternotomy and CABG. There is slight interval increase in size of a right sided pleural effusion and superimposed hazy pulmonary opacity. No pneumothorax. No displaced rib fracture. There are prominent bilateral interstitial opacities, which are nonspecific, but could represent pulmonary venous congestion versus atypical infection. Cardiac and mediastinal contours are unchanged. Visualized upper abdomen is unremarkable. IMPRESSION: 1. Slight interval increase in size of a right sided pleural effusion and superimposed hazy pulmonary opacity, which could represent  combination of pleural effusion and atelectasis, but superimposed infection is not excluded. 2. There are prominent bilateral interstitial opacities, which are nonspecific, but could represent pulmonary venous congestion versus atypical infection. Electronically Signed   By: Marin Roberts M.D.   On: 02/07/2022 11:00    EKG: Personally reviewed by me which shows atrial fibrillation   Consultant: Cardiology  Code Status: Full code  Microbiology none  Antibiotics: None  Family Communication:  Patients' condition and plan of care including tests being ordered have been discussed with the patient and patient's spouse and son at bedside who indicate understanding and agree with the plan.   Severity of Illness: The appropriate patient status for this patient is INPATIENT. Inpatient status is judged to be reasonable and necessary in order to provide the required intensity of service to ensure the patient's safety. The patient's presenting symptoms, physical exam findings, and initial radiographic and laboratory data in the context of their chronic comorbidities is felt to place them at high risk for further clinical deterioration. Furthermore, it is not anticipated that the patient will be medically stable for discharge from the hospital within 2 midnights of admission.  I certify that at the point of admission it  is my clinical judgment that the patient will require inpatient hospital care spanning beyond 2 midnights from the point of admission due to high intensity of service, high risk for further deterioration and high frequency of surveillance required.  Signed, Flora Lipps, MD [facility] 02/07/2022

## 2022-02-07 NOTE — Consult Note (Addendum)
Cardiology Admission History and Physical   Patient ID: [name] MRN: [id number]; DOB: 12-Nov-1945   Admission date: 02/07/2022  PCP:  Garwin Brothers, [place] [facility] Cardiologist:  Shelva Majestic, MD  Electrophysiologist:  Constance Haw, MD  {  Chief Complaint:  chest pain  Patient Profile:   [name] is a 76 y.o. male with complex PMH of CAD s/p CABG in [place] and redo CABG in 1996 and subsequent multiples PCIs (see below), PAD with bilateral SFA occlusion, carotid artery disease s/p right CEA 2005, right bundle branch block, renal artery stenosis s/p right renal artery stenting and repeat intervention for in stent stenosis, chronic left subclavian artery stenosis, CKD stage III, HTN, HLD, and persistent atrial fibrillation not on AC, who is being seen for chest pain.     History of Present Illness:   Per chart review:   [name]  has long standing history of CAD and follows Dr. Claiborne Billings, underwent CABG in [place], redo CABG in 1996, stenting of his left main and stenting of SVG to [place], and stenting of SVG to marginal vessel in 2012.  Last catheterization was 01/2011 showed diffuse disease in the vein graft supplying the marginal vessel and 2 additional Taxus stents inserted commencing ostially and extending to the proximal portion of the recently placed mid prior stent .  He established care with Dr Claiborne Billings in 02/2013. Myovue last done on 07/09/19 showed basal inferior, mid inferior and apical inferior defect consistent with prior myocardial infarction with peri-infarct ischemia. Low risk study. EF 48%.  Most recent Echo from 10/19/2021 with LVEF 60 to 65%, no regional motion abnormality, normal RV, normal PASP, mild to moderate LAE, mild MR, moderate to severe aortic stenosis with aortic valve area, by VTI measures 0.78 cm. Aortic valve mean gradient measures 19.0 mmHg. AVA 0.8 cm. Peak gradient 30.6.  He was last seen by Dr.  Claiborne Billings 10/07/2021,  medically managed with aspirin 81 mg, lovastatin 40 mg, Zetia 10 mg Ranexa 500 mg twice daily.  He is on torsemide 40 mg twice daily and metolazone 2.67m weekly at baseline for chronic leg edema.  He was discovered with atrial fibrillation 2016, reportedly has chronic right bundle branch block, anticoagulation was not started due to GI bleed at the time.  He was found to have permanent atrial fibrillation in 07/2016 and Eliquis and amiodarone were started.  He later self reduced the dose of Eliquis and did not tolerate amiodarone due to fatigue/tremors/dizziness/unsteady gait.  He was referred to EP Dr. CCurt Bears1/2019, felt not candidate for Tikosyn or sotalol due to QT prolongation, felt high risk candidate for ablation.  He was placed back on amiodarone and underwent cardioversion on 04/17/2017.  Later he had recurrent A-fib and also developed tremor where his amiodarone was reduced and ultimately discontinued.  Eliquis was stopped due to anemia in 2021 during hospitalization after left to right femoral-femoral bypass. Last seen by Dr. CCurt Bears4/12/2021, due to hx of iliofemoral bypass and he was felt not a candidate for watchman.  He is minimally symptomatic from atrial fibrillation, has been continued on metoprolol and diltiazem for rate control.  He was continued on baby aspirin.  He has extensive peripheral vascular disease with hx of bilateral carotid stenosis s/p R CEA 2005, bilateral renal artery stenosis requiring stent with in-stent restenosis,  bilateral SFA occlusions, left subclavian artery stenosis with minimal symptoms, and chronic mesenteric ischemia. He underwent celiac  and SMA stenting in May 2020,  left to right femorofemoral  bypass grafting 07/15/2019.  He was last seen by Dr. Gwenlyn Found 02/10/2021, recommended continue interval noninvasive study for renal artery stenosis and left subclavian stenosis monitor.  He was last seen by vascular surgery 01/06/2022, no further resting pain  or nonhealing leg wounds or postprandial abdominal pain.  He was noted with venous insufficiency/lymphedema recommend compression stocking. Last mesenteric doppler 01/06/22 showed elevated SMA velocities suggestive of >70% stenosis. No significant change when compared to prior study 02/25/2021. Last arterial doppler 01/06/22 showed Patent left to right fem-fem bypass graft. 50-74% inflow artery stenosis with PSV of 308cm/s. Findings essentially unchanged when compared to prior study. Last carotid doppler 10/19/21 showed Mild right ICA narrowing at 1 to 39% with moderate in the left ICA at 40 to 59%.   He had extensive history of esophagitis/gastritis, GERD, esophageal ulcer, esophageal stricture, peptic ulcer, colonic AVMs requiring APC, chronic constipation, adenomatous colon polyps.  Most recent colonoscopy was 10/2018 revealed ischemic colitis.  Recent EGD was 10/2018 revealed benign esophageal stricture dilated with 18 mm TTS balloon, gastritis, fixed patent pyloric stenosis, normal duodenum.  He has chronic anemia due to CKD as well as iron deficiency, followed by hematology.  Has required transfusion with PRBC in the past.  Patient presented to the ER today complaining chest pain started at 2:30 AM.  Pain is located at midsternal area, radiating to his back/axilla/jaw.  Pain is 5/10.  Reduced to 3/10 after taking nitroglycerin at home. He had associated nausea, SOB. He had few episode similar chest pain over the past week. Pain is not going away and he was concerned therefore came to the hospital. He states he had MI in the past, typical angina is exertional and occurs after eating, but has not had that kind of pain lately. He is currently chest pain free.   He also is concerned if his pain is from his esophagus problems in the past.   He also c/o that his right foot is more swollen and red than usual, he felt the middle toe of right foot is hurting. He reports chronic leg edema bilaterally since his  bypass surgery, L>R. There is lower leg erythema, weeping of skin, and skin peeling intermittently as well. He has not had non-healing wound of his legs so far. He is not having pain of his legs. He takes torsemide 89m BID currently for leg edema.  He is taking ASA 879mdaily, no active bleeding he noted.   Work up so far showed PH 7.48. CMP showed Cr 2.2, GFR 30, BUN 41, albumin 3.2. Ion Ca 1.06. BNP 601. Hs trop 546. CBC with Hgb 11.6, + left shift. Flu and COVID-19 negative. CXR showed Slight interval increase in size of a right sided pleural effusion and superimposed hazy pulmonary opacity, which could represent combination of pleural effusion and atelectasis, but superimposed infection is not excluded.Prominent bilateral interstitial opacities. EKG showed A fib 93 bpm, RBBB old. He is HD stable at ED, AP 80s, BP 95/53 -117/67, on 2LNC with pox95% at ED. Cardiology is consulted for chest pain.     Past Medical History:  Diagnosis Date   Anemia    Angina    Aortic stenosis    Atrial fibrillation ([place]   Blood transfusion    Bronchitis    Bulging discs    "8 of them; thoracic, lumbar, sacral area"   CHF (congestive heart failure) (HCC)    Chronic back pain  Chronic kidney disease    "mild kidney disease"   Colon polyps    Coronary artery disease 01/24/11   Successful PCI long segmental stensois  vein graft to the CX marginal vessel-graft previously stented prox. 3.0x85m TAXUS stent mid seg 3.0x232mTAXUS stent now tandem stens of 3.0x3845m 3.0x8mm89maced with the seg. 50,80 & 99% stenosis reduced to 0%   DDD (degenerative disc disease)    Diastolic heart failure (HCC)[place] Dysrhythmia    "PAC's and PVC's"   Esophageal dilatation    "have had it done 7 times; last time ~ 2001"   Esophageal stricture    GERD (gastroesophageal reflux disease)    Headache(784.0)    Heart murmur    Hiatal hernia    Hyperlipidemia    Hypertension    [facility]   Myocardial  infarction (HCC)[place]05   "had 3 heart attacks this year"   Paroxysmal atrial fibrillation (HCC)[place] Pneumonia 2020   Pyloric stenosis    RBBB    Renal artery stenosis (HCC)    hhistory of right renal artery stenting and re-intervention for "in-stent restenosis.   S/P CABG (coronary artery bypass graft) [place]    "when I had the heart attacks"   Subclavian artery stenosis, left (HCC)[place] Ulcer    "small; Dr. PerrHenrene Pastornd it 02/2010"    Past Surgical History:  Procedure Laterality Date   ABDOMINAL AORTOGRAM W/LOWER EXTREMITY N/A 05/10/2019   Procedure: ABDOMINAL AORTOGRAM W/LOWER EXTREMITY;  Surgeon: FielElam Dutch;  [facility];  Service: Cardiovascular;  Laterality: N/A;   ADENOIDECTOMY     "when I was an infant"   APPENDECTOMY  [id number] OF WOUND VAC Left 07/23/2019   Procedure: Application Of Wound Vac;  Surgeon: DickAngelia Mould;  [facility];  Laterality: Left;   BALLOON DILATION N/A 10/14/2018   Procedure: BALLOON DILATION;  Surgeon: CiriLavena Bullion;  [facility]astro[facility]ology;  Laterality: N/A;   BIOPSY  07/26/2018   Procedure: BIOPSY;  Surgeon: BeavThornton Park;  [facility]ology;;   BIOPSY  10/14/2018   Procedure: BIOPSY;  Surgeon: CiriLavena Bullion;  [facility]: [facility] Gastroenterology;;   CARDIOVERSION N/A 04/17/2017   Procedure: CARDIOVERSION;  Surgeon: SkaiJerline Pain;  Location: [facility]: Cardiovascular;  Laterality: N/A;   CAROTID ENDARTERECTOMY Right 1996   CE   COLONOSCOPY WITH PROPOFOL N/A 10/14/2018   Procedure: COLONOSCOPY WITH PROPOFOL;  Surgeon: CiriLavena Bullion;  [facility] [place];  Laterality: N/A;   CORONARY ANGIOPLASTY WITH STENT PLACEMENT  01/24/11   "today makes a total of 9 stents"   CORONARY  ARTERY BYPASS GRAFT  1984   CABG X 4   CORONARY ARTERY BYPASS GRAFT  1996   CABG X 4   ENDARTERECTOMY FEMORAL Right 07/15/2019   Procedure: RIGHT FEMORAL ENDARTARECTOMY WITH PROFUNDOPLASTY;  Surgeon: FielElam Dutch;  [facility] [place];  Laterality: Right;   ESOPHAGOGASTRODUODENOSCOPY (EGD) WITH PROPOFOL N/A 07/26/2018   Procedure: ESOPHAGOGASTRODUODENOSCOPY (EGD) WITH PROPOFOL;  Surgeon: BeavThornton Park;  [facility] [place];  Laterality: N/A;   ESOPHAGOGASTRODUODENOSCOPY (EGD) WITH PROPOFOL N/A 10/14/2018   Procedure: ESOPHAGOGASTRODUODENOSCOPY (EGD) WITH PROPOFOL;  Surgeon: CiriLavena Bullion  DO;  Location: [place];  Service: Gastroenterology;  Laterality: N/A;   EYE SURGERY  2009   detached retina in right eye   FEMORAL-FEMORAL BYPASS GRAFT Bilateral 07/15/2019   Procedure: LEFT TO RIGHT FEMORAL-FEMORAL ARTERY;  Surgeon: Elam Dutch, MD;  Location: [facility] OR;  Service: Vascular;  Laterality: Bilateral;   FRACTURE SURGERY  07/03/2018   fractured right hip sx.   IR THORACENTESIS ASP PLEURAL SPACE W/IMG GUIDE  10/12/2018   LEFT HEART CATHETERIZATION WITH CORONARY/GRAFT ANGIOGRAM N/A 01/24/2011   Procedure: LEFT HEART CATHETERIZATION WITH Beatrix Fetters;  Surgeon: Troy Sine, MD;  Location[facility];  Service: Cardiovascular;  Laterality: N/A;   PERIPHERAL VASCULAR INTERVENTION  07/16/2018   Procedure: PERIPHERAL VASCULAR INTERVENTION;  Surgeon: Elam Dutch, MD;  Location: [place] CV LAB;  Service: Cardiovascular;;  SMA and Celiac   pylondial cyst removal     RETINAL DETACHMENT SURGERY  04/2007   right   TONSILLECTOMY  1972   VISCERAL ANGIOGRAPHY N/A 07/16/2018   Procedure: VISCERAL ANGIOGRAPHY;  Surgeon: Elam Dutch, MD;  Location: [place] CV LAB;  Service: Cardiovascular;  Laterality: N/A;   WOUND DEBRIDEMENT Left 07/23/2019   Procedure: INCISION AND DRAINAGE LEFT GROIN WOUND;  Surgeon:  Angelia Mould, MD;  Location: [facility] OR;  Service: Vascular;  Laterality: Left;     Medications Prior to Admission: Prior to Admission medications   Medication Sig Start Date End Date Taking? Authorizing Provider  aspirin EC 81 MG tablet Take 1 tablet (81 mg total) by mouth daily. 04/11/19   Elam Dutch, MD  butalbital-aspirin-caffeine [facility]) [phone] MG capsule Take 1 capsule by mouth daily as needed for headache.  12/03/18   [provider]  diltiazem (CARDIZEM CD) 120 MG 24 hr capsule TAKE 1 CAPSULE BY MOUTH EVERY DAY 07/19/21   Troy Sine, MD  dutasteride (AVODART) 0.5 MG capsule Take 0.5 mg by mouth daily. 11/24/21   [provider]  esomeprazole (NEXIUM) 40 MG capsule TAKE 1 CAPSULE (40 MG TOTAL) BY MOUTH 2 (TWO) TIMES DAILY BEFORE A MEAL. 02/07/22 04/08/22  Cirigliano, Vito V, DO  ezetimibe (ZETIA) 10 MG tablet TAKE 1 TABLET BY MOUTH EVERY DAY Patient not taking: Reported on 01/06/2022 10/15/21   Troy Sine, MD  fluticasone [facility]) 50 MCG/ACT nasal spray Place 1 spray into both nostrils daily. 02/01/22   Garwin Brothers, MD  Gabapentin Enacarbil (HORIZANT) 600 MG TBCR Take 600 mg by mouth daily as needed (restless leg). (1700) 10/08/18   [provider]  Ketotifen Fumarate (ALAWAY OP) Place 1 drop into the right eye daily. Uses before putting contact Lens in R eye    [provider]  KLOR-CON M20 20 MEQ tablet Take 20 mEq by mouth daily.    [provider]  Lidocaine (ZTLIDO) 1.8 % [facility]  09/04/20   [provider]  loratadine (CLARITIN) 10 MG tablet Take 10 mg by mouth daily as needed for allergies, rhinitis or itching.  12/12/13   [provider]  lovastatin (MEVACOR) 40 MG tablet TAKE 1 TABLET BY MOUTH EVERYDAY AT BEDTIME 11/23/20   Troy Sine, MD  metolazone (ZAROXOLYN) 2.5 MG tablet Take 2.5 mg by mouth daily as needed (fluid retention).  09/24/18   [provider]  metoprolol succinate (TOPROL-XL) 25  MG 24 hr tablet TAKE 1 TABLET BY MOUTH EVERYDAY AT BEDTIME 06/18/21   Camnitz, Ocie Doyne, MD  Multiple Vitamins-Minerals (MULTIVITAMINS THER. W/MINERALS) TABS Take 1 tablet by  mouth every evening.    [provider]  nitroGLYCERIN (NITROLINGUAL) 0.4 MG/SPRAY spray PLACE 1 SPRAY UNDER THE TONGUE EVERY 5 (FIVE) MINUTES X 3 DOSES AS NEEDED FOR CHEST PAIN. 04/15/21   [name], MD  nitroGLYCERIN (NITROSTAT) 0.4 MG SL tablet PLACE 1 TABLET UNDER THE TONGUE EVERY 5 MINUTES AS NEEDED. 02/03/22   Troy Sine, MD  Potassium Chloride ER 20 MEQ TBCR Take 20 mEq by mouth as needed (With Metolazone). 05/30/19   [provider]  ranolazine (RANEXA) 500 MG 12 hr tablet Take 1 tablet (500 mg total) by mouth 2 (two) times daily. 11/12/21   Troy Sine, MD  senna-docusate (SENOKOT-S) 8.6-50 MG tablet Take 1 tablet by mouth daily. 09/05/18   Irene Shipper, MD  sucralfate (CARAFATE) 1 GM/10ML suspension Take 10 mLs (1 g total) by mouth 4 (four) times daily -  with meals and at bedtime. Patient not taking: Reported on 10/07/2021 09/05/18 06/26/20  Irene Shipper, MD  torsemide [facility]) 20 MG tablet Take by mouth. 11/20/20   [provider]  vitamin E 200 UNIT capsule Take 200 Units by mouth every evening.    [provider]     Allergies:    Allergies  Allergen Reactions   Codeine Nausea And Vomiting   Sulfa Antibiotics Rash   Sulfonamide Derivatives Rash   Oxycodone Nausea Only    Social History:   Social History   Socioeconomic History   Marital status: Married    Spouse name: Not on file   Number of children: Not on file   Years of education: Not on file   Highest education level: Not on file  Occupational History   Not on file  Tobacco Use   Smoking status: Former    Packs/day: 1.00    Years: 38.00    Total pack years: 38.00    Types: Cigarettes    Quit date: 02/09/2005    Years since quitting: 17.0    Passive exposure: Never   Smokeless tobacco: Never    Tobacco comments:    "quit smoking cigarrettes 2006"  Vaping Use   Vaping Use: Never used  Substance and Sexual Activity   Alcohol use: No    Alcohol/week: 0.0 standard drinks of alcohol   Drug use: No   Sexual activity: Not on file  Other Topics Concern   Not on file  Social History Narrative   Not on file   Social Determinants of Health   Financial Resource Strain: Low Risk  (07/18/2018)   Overall Financial Resource Strain (CARDIA)    Difficulty of Paying Living Expenses: Not hard at all  Food Insecurity: No Food Insecurity (07/18/2018)   Hunger Vital Sign    Worried About Running Out of Food in the Last Year: Never true    [place] in the Last Year: Never true  Transportation Needs: No Transportation Needs (07/18/2018)   PRAPARE - Hydrologist (Medical): No    Lack of Transportation (Non-Medical): No  Physical Activity: Inactive (07/18/2018)   Exercise Vital Sign    Days of Exercise per Week: 0 days    Minutes of Exercise per Session: 0 min  Stress: No Stress Concern Present (07/18/2018)   Gap    Feeling of Stress : Only a little  Social Connections: Unknown (07/18/2018)   Social Connection and Isolation Panel [NHANES]    Frequency of Communication with  Friends and Family: Not on file    Frequency of Social Gatherings with Friends and Family: Not on file    Attends Religious Services: Not on file    Active Member of Clubs or Organizations: No    Attends Archivist Meetings: Never    Marital Status: Married  Human resources officer Violence: Not At Risk (07/18/2018)   Humiliation, Afraid, Rape, and Kick questionnaire    Fear of Current or Ex-Partner: No    Emotionally Abused: No    Physically Abused: No    Sexually Abused: No    Family History:   The patient's family history includes AAA (abdominal aortic aneurysm) in his father; Breast cancer in his paternal  aunt; Diabetes in his sister; Heart attack in his father; Heart disease in his father; Heart failure in his father; Hypertension in his mother; Stroke in his mother; Ulcers in his father. There is no history of Colon cancer.    ROS:  Constitutional: Denied fever, chills, malaise, night sweats Eyes: Denied vision change or loss Ears/Nose/Mouth/Throat: Denied ear ache, sore throat, coughing, sinus pain Cardiovascular: see HPI  Respiratory: see HPI Gastrointestinal: Denied nausea, vomiting, abdominal pain, diarrhea Genital/Urinary: Denied dysuria, hematuria, urinary frequency/urgency Musculoskeletal: see HPI Skin: see HPI Neuro: Denied headache, dizziness, syncope Psych: Denied history of depression/anxiety  Endocrine: Denied history of diabetes   Physical Exam/Data:   Vitals:   02/07/22 1200 02/07/22 1215 02/07/22 1227 02/07/22 1245  BP: (!) 103/55 103/61  99/63  Pulse: 77 75  77  Resp: (!) _0 Temp:      SpO2: (!) 88% 94%  96%  Weight:   59 kg   Height:   _1  (1.651 m)    No intake or output data in the 24 hours ending 02/07/22 1408    02/07/2022   12:27 PM 01/31/2022    2:56 PM 01/06/2022    9:56 AM  Last 3 Weights  Weight (lbs) 130 lb 130 lb 0.6 oz 127 lb 8 oz  Weight (kg) 58.968 kg 58.985 kg 57.834 kg     Body mass index is 21.63 kg/m.   Vitals:  Vitals:   02/07/22 1445 02/07/22 1530  BP: 113/63 117/67  Pulse: 78 88  Resp:  19  Temp:    SpO2: 95% 98%   General Appearance: In no apparent distress, sitting in bed HEENT: Normocephalic, atraumatic.  Neck: Supple, trachea midline, no JVDs Cardiovascular: Irregularly irregular,  rate and rhythm, normal H8-I5,  systolic murmur grade III RUSB Respiratory: Resting breathing unlabored, lungs sounds clear to auscultation bilaterally, no use of accessory muscles. On 2LNC, pox 95%, speaks full sentence.   Gastrointestinal: Bowel sounds positive, abdomen soft Extremities: Able to move all extremities in bed without  difficulty, venous stasis changes of BLE with chronic edema L>R, erythema of noted of BLE and feet  Musculoskeletal: Normal muscle bulk and tone Skin: Intact, warm, dry.  Neurologic: Alert, oriented to person, place and time. Fluent speech, no cognitive deficit Psychiatric: Normal affect. Mood is appropriate.   EKG:  A fib 93 bpm, old RBBB  Relevant CV Studies:   Echo from 10/19/21:    1. Left ventricular ejection fraction, by estimation, is 60 to 65%. The  left ventricle has normal function. The left ventricle has no regional  wall motion abnormalities. There is mild left ventricular hypertrophy.  Indeterminate diastolic filling due to  E-A fusion.   2. Right ventricular systolic function is normal. The right ventricular  size  is normal. There is normal pulmonary artery systolic pressure.   3. Left atrial size was mild to moderately dilated.   4. The mitral valve is normal in structure. Mild mitral valve  regurgitation. No evidence of mitral stenosis.   5. Visually the valve has moderate to severe stenosis. DI 0.23, mean  gradient only 19 mmHg, but AVA 0.78 cm2 SVi 34.. The aortic valve is  calcified. There is moderate calcification of the aortic valve. There is  moderate thickening of the aortic valve.  Aortic valve regurgitation is not visualized. Moderate to severe aortic  valve stenosis. Aortic valve area, by VTI measures 0.78 cm. Aortic valve  mean gradient measures 19.0 mmHg.   6. The inferior vena cava is normal in size with greater than 50%  respiratory variability, suggesting right atrial pressure of 3 mmHg.   Stress MYOVUE 07/09/19:  Nuclear stress EF: 48%. There was no ST segment deviation noted during stress. Defect 1: There is a large defect of severe severity present in the basal inferior, mid inferior and apical inferior location. Findings consistent with prior myocardial infarction with peri-infarct ischemia. This is a low risk study. The left ventricular  ejection fraction is mildly decreased (45-54%).  Laboratory Data:  High Sensitivity Troponin:   Recent Labs  Lab 02/07/22 1200  TROPONINIHS 546*      Chemistry Recent Labs  Lab 01/31/22 1538 02/07/22 1200 02/07/22 1237  NA 143 138 139  K 3.7 3.5 3.5  CL 98 106  --   CO2 23 25  --   GLUCOSE 95 110*  --   BUN 36* 41*  --   CREATININE 2.01* 2.20*  --   CALCIUM 9.0 8.2*  --   GFRNONAA  --  30*  --   ANIONGAP  --  7  --     Recent Labs  Lab 01/31/22 1538 02/07/22 1200  PROT 6.9 6.1*  ALBUMIN 4.3 3.2*  AST 14 19  ALT 7 11  ALKPHOS 109 80  BILITOT 0.2 0.6   Lipids No results for input(s): "CHOL", "TRIG", "HDL", "LABVLDL", "LDLCALC", "CHOLHDL" in the last 168 hours. Hematology Recent Labs  Lab 02/07/22 1200 02/07/22 1237  WBC 10.4  --   RBC 3.35*  --   HGB 11.6* 11.2*  HCT 33.0* 33.0*  MCV 98.5  --   MCH 34.6*  --   MCHC 35.2  --   RDW 13.2  --   PLT 157  --    Thyroid No results for input(s): "TSH", "FREET4" in the last 168 hours. BNPNo results for input(s): "BNP", "PROBNP" in the last 168 hours.  DDimer No results for input(s): "DDIMER" in the last 168 hours.   Radiology/Studies:  DG Chest Port 1 View  Result Date: 02/07/2022 CLINICAL DATA:  Chest pain EXAM: PORTABLE CHEST 1 VIEW COMPARISON:  CR 11/29/18 FINDINGS: Postsurgical changes from median sternotomy and CABG. There is slight interval increase in size of a right sided pleural effusion and superimposed hazy pulmonary opacity. No pneumothorax. No displaced rib fracture. There are prominent bilateral interstitial opacities, which are nonspecific, but could represent pulmonary venous congestion versus atypical infection. Cardiac and mediastinal contours are unchanged. Visualized upper abdomen is unremarkable. IMPRESSION: 1. Slight interval increase in size of a right sided pleural effusion and superimposed hazy pulmonary opacity, which could represent combination of pleural effusion and atelectasis, but  superimposed infection is not excluded. 2. There are prominent bilateral interstitial opacities, which are nonspecific, but could represent pulmonary venous congestion versus  atypical infection. Electronically Signed   By: Marin Roberts M.D.   On: 02/07/2022 11:00     Assessment and Plan:   NSTEMI Multivessel CAD with re-do CABG 1196 and multiple PCIs last on 2012 - presented with chest pain at rest, SOB, improved with nitro - Hs trop 546 x1, 2nd pending - EKG no acute changes - Cr 2.2 with eGFR 30 with CKD III - known extensive disease per previous cath, known extensive PVD with multiple intervention - recommend medical therapy to optimize anti-anginal regimen: continue 48 hours heparin gtt, PTA ASA 59m, Metoprolol XL 248mdaily, Diltiazem 12063maily, Ranexa 500m90mD, may add Imdur 30mg90mly   Permanent A fib - continue rate control measures with AVN agent  - not tolerating anticoagulation in the past due to GI bleed and anemia - not candidate for watchman per EP   Right foot erythema and swelling, new PAD s/p left to right femorofemoral  bypass grafting 07/15/2019 Venous stasis  Lymphedema  - will need evaluation for cellulitis versus worsening PAD, consider vascular surgery consult  - may continue PTA torsemide and metolazone    HLD - continue statin and zetia   CKD III - avoid nephrotoxin   GERD Gastritis/esophagitis  - per primary team       Risk Assessment/Risk Scores:   New York Heart Association (NYHA) Functional Class NYHA Class II    For questions or updates, please contact [facility] consult [email or web address] for contact info under     Signed, Xika Margie Billet 02/07/2022 2:08 PM    Patient seen, examined. Available data reviewed. Agree with findings, assessment, and plan as outlined by Xika Margie Billet  The patient is independently interviewed and examined.  His wife and son are at the bedside.  The patient is chronically ill-appearing.   HEENT is pertinent for poor dentition.  JVP is normal.  Carotid upstrokes are normal with bilateral bruits.  Lungs are diminished in the bases but otherwise clear.  Heart is regular rate and rhythm with a 3/6 systolic murmur at the left lower sternal border.  Abdomen is soft and nontender.  Extremities have 2+ pedal edema.  The right foot is erythematous compared to the left.  There are some dry ulcerations on the right lower extremity.  The right second toe has a dressing in place.  The patient presents with progressive symptoms of angina and has ruled in for non-STEMI.  He has extensive comorbidities including stage IIIb chronic kidney disease, severe PAD with history of left to right femorofemoral bypass grafting, and extensive coronary artery disease as outlined above.  The patient has had redo CABG and multiple PCI procedures.  His last cardiac catheterization was performed greater than 10 years ago.  He now presents with worsening angina that occurs with eating and sometimes after eating.  His symptoms have been responsive to sublingual nitroglycerin.  I suspect a combination of progressive coronary artery disease and aortic stenosis have led to worsening anginal symptoms and diastolic heart failure.  His x-ray shows pleural effusion and increased vascular congestion.  Unfortunately I think his treatment options are very limited.  He has severe occlusive peripheral arterial disease with left subclavian occlusion, severe aortoiliac disease with history of femorofemoral bypass, and I am not sure there is a way that we can evaluate his coronary and bypass graft anatomy, nor would we be able to perform transcatheter aortic valve replacement for treatment of his aortic stenosis.  I suspect the most  appropriate treatment will be palliative medical therapy.  I recommended initiation of isosorbide 30 mg daily.  He will continue current doses of ranolazine, diltiazem, and metoprolol.  I had a lengthy discussion with  him about limitations of interventional therapies in the setting of his comorbidities and they understand these limitations and the risks they present.  We will hold torsemide and try him on IV furosemide for diuresis and treatment of heart failure.  He will be treated with IV heparin for 48 hours.  He may need vascular surgery evaluation of his lower extremity occlusive disease but will defer that initially.  We will ask the hospitalist service to admit him in the setting of his extensive comorbidities and we we will follow closely in consultation.  Sherren Mocha, M.D. 02/07/2022 5:07 PM

## 2022-02-07 NOTE — ED Provider Notes (Signed)
[facility] EMERGENCY DEPARTMENT Provider Note  CSN: [id number] Arrival date & time: 02/07/22 0940  Chief Complaint(s) Chest Pain  HPI [name] is a 76 y.o. male with history of A-fib, coronary artery disease with prior CABG x 2, CHF, presenting to the emergency department with chest pain.  Patient reports he always has some anginal pain and takes nitroglycerin.  He reports his pain yesterday was worse, associated with exertion, improved with rest and nitroglycerin.  He reports that it came on slowly and resolved with rest.  He reports that radiated to the back.  He reports some shortness of breath as well as some leg swelling.  No fevers or chills, cough.  Reports nausea, vomiting once this morning.  Currently the symptoms are mild as he is sitting still.   Past Medical History Past Medical History:  Diagnosis Date   Anemia    Angina    Aortic stenosis    Atrial fibrillation ([place])    Blood transfusion    Bronchitis    Bulging discs    "8 of them; thoracic, lumbar, sacral area"   CHF (congestive heart failure) (HCC)    Chronic back pain    Chronic kidney disease    "mild kidney disease"   Colon polyps    Coronary artery disease 01/24/11   Successful PCI long segmental stensois  vein graft to the CX marginal vessel-graft previously stented prox. 3.0x9m TAXUS stent mid seg 3.0x278mTAXUS stent now tandem stens of 3.0x3844m 3.0x8mm61maced with the seg. 50,80 & 99% stenosis reduced to 0%   DDD (degenerative disc disease)    Diastolic heart failure (HCC)[place] Dysrhythmia    "PAC's and PVC's"   Esophageal dilatation    "have had it done 7 times; last time ~ 2001"   Esophageal stricture    GERD (gastroesophageal reflux disease)    Headache(784.0)    Heart murmur    Hiatal hernia    Hyperlipidemia    Hypertension    [facility]   Myocardial infarction (HCC)[place]05   "had 3 heart attacks this year"   Paroxysmal atrial fibrillation  (HCC)[place] Pneumonia 2020   Pyloric stenosis    RBBB    Renal artery stenosis (HCC)    hhistory of right renal artery stenting and re-intervention for "in-stent restenosis.   S/P CABG (coronary artery bypass graft) [place]    "when I had the heart attacks"   Subclavian artery stenosis, left (HCC)[place] Ulcer    "small; Dr. PerrHenrene Pastornd it 02/2010"   Patient Active Problem List   Diagnosis Date Noted   Weakness of both lower extremities 01/31/2022   Stage 3b chronic kidney disease (HCC)[place]/27/2023   History of foot ulcer 07/09/2020   Anemia in chronic kidney disease 03/15/2020   Iron deficiency anemia 03/15/2020   Onychomycosis due to dermatophyte 10/17/2019   S/P femoral-femoral bypass surgery 07/22/2019   PAD (peripheral artery disease) (HCC)[place]/12/2019   Ischemic pain of right foot 04/02/2019   Paronychia of toe 03/25/2019   Peripheral vascular disease of foot (HCC)[place]/02/2020   Gastritis and gastroduodenitis    Symptomatic anemia 10/10/2018   AKI (acute kidney injury) (HCC)[place]/07/2018   Hypokalemia 10/10/2018   Bilateral lower extremity edema 08/17/2018   Malnutrition of moderate degree 07/25/2018   Dysphagia 07/24/2018   Volume overload 07/23/2018  Nausea and vomiting 07/23/2018   Chronic mesenteric ischemia (HCC)    NSTEMI (non-ST elevated myocardial infarction) ([place]) 07/12/2018   Fever 07/12/2018   Lobar pneumonia ([place]) 93/73/4287   Diastolic CHF ([place]) 68/01/5725   Chronic anticoagulation 04/19/2017   Subclavian artery stenosis, left (HCC)    Dyspnea    S/P CABG (coronary artery bypass graft)    Renal artery stenosis (HCC)    RBBB    Pyloric stenosis    Paroxysmal atrial fibrillation (HCC)    Hyperlipidemia    Hiatal hernia    GERD (gastroesophageal reflux disease)    Benign esophageal stricture    Esophageal dilatation    Dysrhythmia    Complication of anesthesia    Colon polyps    Chronic back  pain    Bronchitis    Atrial fibrillation (HCC)    Palpitations 06/16/2016   PAF (paroxysmal atrial fibrillation) (HCC)    Change in bowel habits 02/03/2014   CN (constipation) 02/03/2014   Rectal bleeding 02/03/2014   Hyperlipidemia LDL goal <70 01/16/2014   Carotid stenosis 12/10/2013   Aftercare following surgery of the circulatory system 12/10/2013   Carotid disease, bilateral ([place]) 03/06/2013   Anemia 01/26/2011   PVD (peripheral vascular disease) ([place]) 01/26/2011   RBBB (right bundle branch block with left anterior fascicular block) 01/26/2011   Crescendo angina ([place]) 01/25/2011   Presence of stent of bypass graft 01/25/2011   Coronary artery disease 01/24/2011   CONSTIPATION 01/25/2010   NAUSEA 08/06/2009   ABDOMINAL PAIN, GENERALIZED 08/06/2009   ABDOMINAL PAIN-MULTIPLE SITES 08/06/2009   INGUINAL HERNIA 03/12/2009   FLATULENCE-GAS-BLOATING 03/12/2009   Ulcer of esophagus without bleeding 01/30/2008   HYPERLIPIDEMIA 04/24/2007   Essential hypertension 04/24/2007   Hx of CABG '84, '96, last PCI 2012 04/24/2007   HEMORRHOIDS 04/24/2007   EROSIVE ESOPHAGITIS 04/24/2007   GASTROESOPHAGEAL REFLUX DISEASE 04/24/2007   PEPTIC STRICTURE 04/24/2007   Myocardial infarction ([place]) 03/07/1989   Home Medication(s) Prior to Admission medications   Medication Sig Start Date End Date Taking? Authorizing Provider  aspirin EC 81 MG tablet Take 1 tablet (81 mg total) by mouth daily. 04/11/19   Elam Dutch, MD  butalbital-aspirin-caffeine [facility]) [phone] MG capsule Take 1 capsule by mouth daily as needed for headache.  12/03/18   [provider]  diltiazem (CARDIZEM CD) 120 MG 24 hr capsule TAKE 1 CAPSULE BY MOUTH EVERY DAY 07/19/21   Troy Sine, MD  dutasteride (AVODART) 0.5 MG capsule Take 0.5 mg by mouth daily. 11/24/21   [provider]  esomeprazole (NEXIUM) 40 MG capsule TAKE 1 CAPSULE (40 MG TOTAL) BY MOUTH 2 (TWO) TIMES DAILY BEFORE A MEAL. 02/07/22  04/08/22  Cirigliano, Vito V, DO  ezetimibe (ZETIA) 10 MG tablet TAKE 1 TABLET BY MOUTH EVERY DAY Patient not taking: Reported on 01/06/2022 10/15/21   Troy Sine, MD  fluticasone [facility]) 50 MCG/ACT nasal spray Place 1 spray into both nostrils daily. 02/01/22   Garwin Brothers, MD  Gabapentin Enacarbil (HORIZANT) 600 MG TBCR Take 600 mg by mouth daily as needed (restless leg). (1700) 10/08/18   [provider]  Ketotifen Fumarate (ALAWAY OP) Place 1 drop into the right eye daily. Uses before putting contact Lens in R eye    [provider]  KLOR-CON M20 20 MEQ tablet Take 20 mEq by mouth daily.    [provider]  Lidocaine (ZTLIDO) 1.8 % [facility]  09/04/20   [provider]  loratadine (CLARITIN) 10 MG tablet Take  10 mg by mouth daily as needed for allergies, rhinitis or itching.  12/12/13   [provider]  lovastatin (MEVACOR) 40 MG tablet TAKE 1 TABLET BY MOUTH EVERYDAY AT BEDTIME 11/23/20   Troy Sine, MD  metolazone (ZAROXOLYN) 2.5 MG tablet Take 2.5 mg by mouth daily as needed (fluid retention).  09/24/18   [provider]  metoprolol succinate (TOPROL-XL) 25 MG 24 hr tablet TAKE 1 TABLET BY MOUTH EVERYDAY AT BEDTIME 06/18/21   Camnitz, Ocie Doyne, MD  Multiple Vitamins-Minerals (MULTIVITAMINS THER. W/MINERALS) TABS Take 1 tablet by mouth every evening.    [provider]  nitroGLYCERIN (NITROLINGUAL) 0.4 MG/SPRAY spray PLACE 1 SPRAY UNDER THE TONGUE EVERY 5 (FIVE) MINUTES X 3 DOSES AS NEEDED FOR CHEST PAIN. 04/15/21   [name], MD  nitroGLYCERIN (NITROSTAT) 0.4 MG SL tablet PLACE 1 TABLET UNDER THE TONGUE EVERY 5 MINUTES AS NEEDED. 02/03/22   Troy Sine, MD  Potassium Chloride ER 20 MEQ TBCR Take 20 mEq by mouth as needed (With Metolazone). 05/30/19   [provider]  ranolazine (RANEXA) 500 MG 12 hr tablet Take 1 tablet (500 mg total) by mouth 2 (two) times daily. 11/12/21   Troy Sine, MD  senna-docusate  (SENOKOT-S) 8.6-50 MG tablet Take 1 tablet by mouth daily. 09/05/18   Irene Shipper, MD  sucralfate (CARAFATE) 1 GM/10ML suspension Take 10 mLs (1 g total) by mouth 4 (four) times daily -  with meals and at bedtime. Patient not taking: Reported on 10/07/2021 09/05/18 06/26/20  Irene Shipper, MD  torsemide [facility]) 20 MG tablet Take by mouth. 11/20/20   [provider]  vitamin E 200 UNIT capsule Take 200 Units by mouth every evening.    [provider]                                                                                                                                    Past Surgical History Past Surgical History:  Procedure Laterality Date   ABDOMINAL AORTOGRAM W/LOWER EXTREMITY N/A 05/10/2019   Procedure: ABDOMINAL AORTOGRAM W/LOWER EXTREMITY;  Surgeon: Elam Dutch, MD;  Location: [place] CV [facility];  Service: Cardiovascular;  Laterality: N/A;   ADENOIDECTOMY     "when I was an infant"   APPENDECTOMY  3267   APPLICATION OF WOUND VAC Left 07/23/2019   Procedure: Application Of Wound Vac;  Surgeon: Angelia Mould, MD;  Location: [place];  Service: Vascular;  Laterality: Left;   BALLOON DILATION N/A 10/14/2018   Procedure: BALLOON DILATION;  Surgeon: Lavena Bullion, DO;  Location: [place];  Service: Gastroenterology;  Laterality: N/A;   BIOPSY  07/26/2018   Procedure: BIOPSY;  Surgeon: [place], MD;  Location: [place];  Service: Gastroenterology;;   BIOPSY  10/14/2018   Procedure: BIOPSY;  Surgeon: Lavena Bullion, DO;  Location: [place];  Service: Gastroenterology;;   CARDIOVERSION N/A  04/17/2017   Procedure: CARDIOVERSION;  Surgeon: Jerline Pain, MD;  Location: [facility] ENDOSCOPY;  Service: Cardiovascular;  Laterality: N/A;   CAROTID ENDARTERECTOMY Right 1996   CE   COLONOSCOPY WITH PROPOFOL N/A 10/14/2018   Procedure: COLONOSCOPY WITH PROPOFOL;  Surgeon: Lavena Bullion, DO;  Location: [place];  Service: Gastroenterology;   Laterality: N/A;   CORONARY ANGIOPLASTY WITH STENT PLACEMENT  01/24/11   "today makes a total of 9 stents"   CORONARY ARTERY BYPASS GRAFT  1984   CABG X 4   CORONARY ARTERY BYPASS GRAFT  1996   CABG X 4   ENDARTERECTOMY FEMORAL Right 07/15/2019   Procedure: RIGHT FEMORAL ENDARTARECTOMY WITH PROFUNDOPLASTY;  Surgeon: Elam Dutch, MD;  Location: [place];  Service: Vascular;  Laterality: Right;   ESOPHAGOGASTRODUODENOSCOPY (EGD) WITH PROPOFOL N/A 07/26/2018   Procedure: ESOPHAGOGASTRODUODENOSCOPY (EGD) WITH PROPOFOL;  Surgeon: [place], MD;  Location: [place];  Service: Gastroenterology;  Laterality: N/A;   ESOPHAGOGASTRODUODENOSCOPY (EGD) WITH PROPOFOL N/A 10/14/2018   Procedure: ESOPHAGOGASTRODUODENOSCOPY (EGD) WITH PROPOFOL;  Surgeon: Lavena Bullion, DO;  Location: [place];  Service: Gastroenterology;  Laterality: N/A;   EYE SURGERY  2009   detached retina in right eye   FEMORAL-FEMORAL BYPASS GRAFT Bilateral 07/15/2019   Procedure: LEFT TO RIGHT FEMORAL-FEMORAL ARTERY;  Surgeon: Elam Dutch, MD;  Location[facility] OR;  Service: Vascular;  Laterality: Bilateral;   FRACTURE SURGERY  07/03/2018   fractured right hip sx.   IR THORACENTESIS ASP PLEURAL SPACE W/IMG GUIDE  10/12/2018   LEFT HEART CATHETERIZATION WITH CORONARY/GRAFT ANGIOGRAM N/A 01/24/2011   Procedure: LEFT HEART CATHETERIZATION WITH Beatrix Fetters;  Surgeon: Troy Sine, MD;  Location: [facility] CATH LAB;  Service: Cardiovascular;  Laterality: N/A;   PERIPHERAL VASCULAR INTERVENTION  07/16/2018   Procedure: PERIPHERAL VASCULAR INTERVENTION;  Surgeon: Elam Dutch, MD;  Location: [place] [facility];  Service: Cardiovascular;;  SMA and Celiac   pylondial cyst removal     RETINAL DETACHMENT SURGERY  04/2007   right   TONSILLECTOMY  1972   VISCERAL ANGIOGRAPHY N/A 07/16/2018   Procedure: VISCERAL ANGIOGRAPHY;  Surgeon: Elam Dutch, MD;  Location: [place] [facility];  Service: Cardiovascular;   Laterality: N/A;   WOUND DEBRIDEMENT Left 07/23/2019   Procedure: INCISION AND DRAINAGE LEFT GROIN WOUND;  Surgeon: Angelia Mould, MD;  Location: [facility] OR;  Service: Vascular;  Laterality: Left;   Family History Family History  Problem Relation Age of Onset   Breast cancer Paternal Aunt    Diabetes Sister    Ulcers Father    Heart disease Father    Heart failure Father    Heart attack Father    AAA (abdominal aortic aneurysm) Father    Stroke Mother    Hypertension Mother    Colon cancer Neg Hx     Social History Social History   Tobacco Use   Smoking status: Former    Packs/day: 1.00    Years: 38.00    Total pack years: 38.00    Types: Cigarettes    Quit date: 02/09/2005    Years since quitting: 17.0    Passive exposure: Never   Smokeless tobacco: Never   Tobacco comments:    "quit smoking cigarrettes 2006"  Vaping Use   Vaping Use: Never used  Substance Use Topics   Alcohol use: No    Alcohol/week: 0.0 standard drinks of alcohol   Drug use: No   Allergies Codeine, Sulfa antibiotics, Sulfonamide derivatives, and Oxycodone  Review of Systems Review of Systems  All other systems reviewed and are negative.   Physical Exam Vital Signs  I have reviewed the triage vital signs BP 104/67   Pulse 80   Temp 98.6 F (37 C)   Resp 20   Ht '5\' 5"'$  (1.651 m)   Wt 59 kg   SpO2 98%   BMI 21.63 kg/m  Physical Exam Vitals and nursing note reviewed.  Constitutional:      General: He is not in acute distress.    Appearance: Normal appearance.  HENT:     Mouth/Throat:     Mouth: Mucous membranes are moist.  Eyes:     Conjunctiva/sclera: Conjunctivae normal.  Cardiovascular:     Rate and Rhythm: Normal rate and regular rhythm.  Pulmonary:     Effort: Pulmonary effort is normal. No respiratory distress.     Breath sounds: Normal breath sounds.  Abdominal:     General: Abdomen is flat.     Palpations: Abdomen is soft.     Tenderness: There is no abdominal  tenderness.  Musculoskeletal:     Right lower leg: Edema present.     Left lower leg: Edema present.  Skin:    General: Skin is warm and dry.     Capillary Refill: Capillary refill takes less than 2 seconds.  Neurological:     Mental Status: He is alert and oriented to person, place, and time. Mental status is at baseline.  Psychiatric:        Mood and Affect: Mood normal.        Behavior: Behavior normal.     ED Results and Treatments Labs (all labs ordered are listed, but only abnormal results are displayed) Labs Reviewed  COMPREHENSIVE METABOLIC PANEL - Abnormal; Notable for the following components:      Result Value   Glucose, Bld 110 (*)    BUN 41 (*)    Creatinine, Ser 2.20 (*)    Calcium 8.2 (*)    Total Protein 6.1 (*)    Albumin 3.2 (*)    GFR, Estimated 30 (*)    All other components within normal limits  CBC WITH DIFFERENTIAL/PLATELET - Abnormal; Notable for the following components:   RBC 3.35 (*)    Hemoglobin 11.6 (*)    HCT 33.0 (*)    MCH 34.6 (*)    Neutro Abs 9.4 (*)    Lymphs Abs 0.5 (*)    All other components within normal limits  BRAIN NATRIURETIC PEPTIDE - Abnormal; Notable for the following components:   B Natriuretic Peptide 601.5 (*)    All other components within normal limits  I-STAT VENOUS BLOOD GAS, ED - Abnormal; Notable for the following components:   pH, Ven 7.483 (*)    pCO2, Ven 33.0 (*)    pO2, Ven 57 (*)    Calcium, Ion 1.06 (*)    HCT 33.0 (*)    Hemoglobin 11.2 (*)    All other components within normal limits  TROPONIN I (HIGH SENSITIVITY) - Abnormal; Notable for the following components:   Troponin I (High Sensitivity) 546 (*)    All other components within normal limits  RESP PANEL BY RT-PCR (FLU A&B, COVID) ARPGX2  HEPARIN LEVEL (UNFRACTIONATED)  TROPONIN I (HIGH SENSITIVITY)  Radiology DG Chest Port  1 View  Result Date: 02/07/2022 CLINICAL DATA:  Chest pain EXAM: PORTABLE CHEST 1 VIEW COMPARISON:  CR 11/29/18 FINDINGS: Postsurgical changes from median sternotomy and CABG. There is slight interval increase in size of a right sided pleural effusion and superimposed hazy pulmonary opacity. No pneumothorax. No displaced rib fracture. There are prominent bilateral interstitial opacities, which are nonspecific, but could represent pulmonary venous congestion versus atypical infection. Cardiac and mediastinal contours are unchanged. Visualized upper abdomen is unremarkable. IMPRESSION: 1. Slight interval increase in size of a right sided pleural effusion and superimposed hazy pulmonary opacity, which could represent combination of pleural effusion and atelectasis, but superimposed infection is not excluded. 2. There are prominent bilateral interstitial opacities, which are nonspecific, but could represent pulmonary venous congestion versus atypical infection. Electronically Signed   By: Marin Roberts M.D.   On: 02/07/2022 11:00    Pertinent labs & imaging results that were available during my care of the patient were reviewed by me and considered in my medical decision making (see MDM for details).  Medications Ordered in ED Medications  heparin ADULT infusion 100 units/mL (25000 units/260m) (700 Units/hr Intravenous New Bag/Given 02/07/22 1432)  furosemide (LASIX) injection 40 mg (40 mg Intravenous Given 02/07/22 1221)  heparin bolus via infusion 3,500 Units (3,500 Units Intravenous Bolus from Bag 02/07/22 1433)                                                                                                                                     Procedures .Critical Care  Performed by: SCristie Hem MD Authorized by: SCristie Hem MD   Critical care provider statement:    Critical care time (minutes):  30   Critical care time was exclusive of:  Separately billable procedures and treating  other patients   Critical care was necessary to treat or prevent imminent or life-threatening deterioration of the following conditions:  Cardiac failure   Critical care was time spent personally by me on the following activities:  Development of treatment plan with patient or surrogate, discussions with consultants, evaluation of patient's response to treatment, examination of patient, ordering and review of laboratory studies, ordering and review of radiographic studies, ordering and performing treatments and interventions, pulse oximetry, re-evaluation of patient's condition and review of old charts   Care discussed with: admitting provider     (including critical care time)  Medical Decision Making / ED Course   MDM:  76year old male presenting to the emergency department with chest pain.  Patient chronically ill-appearing but no acute distress.  Differential includes ACS, will obtain troponin.  EKG without evidence of acute ST changes.  Differential also includes pneumothorax, pneumonia although less likely without cough.  Doubt PE, patient denies pleuritic pain.  The pain radiates to the back, doubt dissection, pain comes and goes, worse with exertion, not severe sudden onset pain.  Less likely esophageal pathology  as pain preceded the nausea and vomiting.  Will obtain chest x-ray, labs.  Patient may also have component of CHF as he does have some lower extremity edema.  Clinical Course as of 02/07/22 1525  Mon Feb 07, 2022  1517 Discussed with cardiology, they will consult.  Patient no longer having any chest pain.  He reports that he received 4 baby aspirin from EMS and chewed them up earlier today. [WS]  1519 Will admit patient [WS]    Clinical Course User Index [WS] Cristie Hem, MD     Additional history obtained: -Additional history obtained from ems and spouse -External records from outside source obtained and reviewed including: Chart review including previous  notes, labs, imaging, consultation notes including 2021 lexiscan   Lab Tests: -I ordered, reviewed, and interpreted labs.   The pertinent results include:   Labs Reviewed  COMPREHENSIVE METABOLIC PANEL - Abnormal; Notable for the following components:      Result Value   Glucose, Bld 110 (*)    BUN 41 (*)    Creatinine, Ser 2.20 (*)    Calcium 8.2 (*)    Total Protein 6.1 (*)    Albumin 3.2 (*)    GFR, Estimated 30 (*)    All other components within normal limits  CBC WITH DIFFERENTIAL/PLATELET - Abnormal; Notable for the following components:   RBC 3.35 (*)    Hemoglobin 11.6 (*)    HCT 33.0 (*)    MCH 34.6 (*)    Neutro Abs 9.4 (*)    Lymphs Abs 0.5 (*)    All other components within normal limits  BRAIN NATRIURETIC PEPTIDE - Abnormal; Notable for the following components:   B Natriuretic Peptide 601.5 (*)    All other components within normal limits  I-STAT VENOUS BLOOD GAS, ED - Abnormal; Notable for the following components:   pH, Ven 7.483 (*)    pCO2, Ven 33.0 (*)    pO2, Ven 57 (*)    Calcium, Ion 1.06 (*)    HCT 33.0 (*)    Hemoglobin 11.2 (*)    All other components within normal limits  TROPONIN I (HIGH SENSITIVITY) - Abnormal; Notable for the following components:   Troponin I (High Sensitivity) 546 (*)    All other components within normal limits  RESP PANEL BY RT-PCR (FLU A&B, COVID) ARPGX2  HEPARIN LEVEL (UNFRACTIONATED)  TROPONIN I (HIGH SENSITIVITY)    Notable for elevated BNP and elevated troponin  EKG   EKG Interpretation  Date/Time:  Monday February 07 2022 09:43:58 EST Ventricular Rate:  93 PR Interval:    QRS Duration: 154 QT Interval:  432 QTC Calculation: 538 R Axis:   118 Text Interpretation: Atrial fibrillation Right bundle branch block Borderline repolarization abnormality Confirmed by Garnette Gunner [id number]) on 02/07/2022 9:50:19 AM         Imaging Studies ordered: I ordered imaging studies including CXR  On my  interpretation imaging demonstrates no acute process I independently visualized and interpreted imaging. I agree with the radiologist interpretation   Medicines ordered and prescription drug management: Meds ordered this encounter  Medications   furosemide (LASIX) injection 40 mg   DISCONTD: aspirin tablet 325 mg   heparin bolus via infusion 3,500 Units   heparin ADULT infusion 100 units/mL (25000 units/240m)    -I have reviewed the patients home medicines and have made adjustments as needed   Consultations Obtained: I requested consultation with the cardiologist,  and discussed lab and imaging findings as  well as pertinent plan - they recommend: admission   Cardiac Monitoring: The patient was maintained on a cardiac monitor.  I personally viewed and interpreted the cardiac monitored which showed an underlying rhythm of: NSR  Social Determinants of Health:  Diagnosis or treatment significantly limited by social determinants of health: former smoker   Reevaluation: After the interventions noted above, I reevaluated the patient and found that they have improved  Co morbidities that complicate the patient evaluation  Past Medical History:  Diagnosis Date   Anemia    Angina    Aortic stenosis    Atrial fibrillation ([place])    Blood transfusion    Bronchitis    Bulging discs    "8 of them; thoracic, lumbar, sacral area"   CHF (congestive heart failure) (HCC)    Chronic back pain    Chronic kidney disease    "mild kidney disease"   Colon polyps    Coronary artery disease 01/24/11   Successful PCI long segmental stensois  vein graft to the CX marginal vessel-graft previously stented prox. 3.0x28m TAXUS stent mid seg 3.0x267mTAXUS stent now tandem stens of 3.0x3865m 3.0x8mm58maced with the seg. 50,80 & 99% stenosis reduced to 0%   DDD (degenerative disc disease)    Diastolic heart failure (HCC)[place] Dysrhythmia    "PAC's and PVC's"   Esophageal dilatation    "have had it  done 7 times; last time ~ 2001"   Esophageal stricture    GERD (gastroesophageal reflux disease)    Headache(784.0)    Heart murmur    Hiatal hernia    Hyperlipidemia    Hypertension    [facility]   Myocardial infarction (HCC)[place]05   "had 3 heart attacks this year"   Paroxysmal atrial fibrillation (HCC)[place] Pneumonia 2020   Pyloric stenosis    RBBB    Renal artery stenosis (HCC)    hhistory of right renal artery stenting and re-intervention for "in-stent restenosis.   S/P CABG (coronary artery bypass graft) [place]    "when I had the heart attacks"   Subclavian artery stenosis, left (HCC)    Ulcer    "small; Dr. PerrHenrene Pastornd it 02/2010"      Dispostion: Disposition decision including need for hospitalization was considered, and patient admitted to the hospital.    Final Clinical Impression(s) / ED Diagnoses Final diagnoses:  [facility]ST elevated [facility]) [facility]  This chart was dictated using voice recognition software.  Despite best efforts to proofread,  errors can occur which can change the documentation meaning.    ScheCristie Hem 02/07/22 1525

## 2022-02-07 NOTE — ED Triage Notes (Signed)
Pt BIB [facility] from home d/t CP that started at 0230, rated it 5/10, radiates to back & feels like a central burning pain. Home NITRO DECREASED PAIN TO 3/10 DOES HAVE EXTENSIVE CARDIAC hX, a/oX4, a-FIB & Rt BBB ON ems MONITOR, 18G Rt AC PIV, 116/72, 90 BPM, 97% on 2L O2, cbg 136, PT REPORTS NOT FEELING WELL OVER THE LAST 24 HRS PRIOR TO ARRIVAL.

## 2022-02-07 NOTE — Telephone Encounter (Signed)
Spouse called in to triage to report patient is having active chest pain and SOB. She stated 2 ntg did not help. Recommended she call 911 and have patient take the 3rd NTG. She wanted to have patient taken to [facility]. Recommended she speak with EMS personnel when they arrive to discuss those options.

## 2022-02-07 NOTE — Hospital Course (Signed)
Patient is a 76 years old male with past medical history of coronary artery disease status post CABG in 1984 followed by redo CABG in 1996 history of PCI, CKD stage III, hypertension, hyperlipidemia, persistent atrial fibrillation not on anticoagulation, history of carotid artery disease status post carotid endarterectomy 2005, renal artery stenosis status post stenting presented to hospital with complaints of chest pain that started this morning in the midsternal area with radiation to the back, Jaw which improved with nitroglycerin.  This was associated with some nausea and shortness of breath.  He had been having some similar chest pain over the last week or so.  He also complained of right foot redness and swelling.   In the ED, patient was mildly hypotensive and was on 2 L of oxygen by nasal cannula.  Patient was noted to have elevated creatinine at 2.2 with elevated troponin at 546.  Chest x-ray showed increased right pleural effusion and superimposed pulmonary opacity.  EKG showed atrial fibrillation with right bundle branch block.  Assessment and plan.  Non-ST elevation MI.  History of multivessel coronary artery disease status post CABG.  Patient had chest pain pain and shortness of breath that improved with nitro.  EKG without any acute changes.  Patient does have underlying CKD stage III and had multiple cardiac and vascular intervention in the past.  Cardiology has seen the patient today and recommend 48 hours of heparin drip aspirin beta-blocker Cardizem Ranexa and Imdur.  Permanent A fib Not on anticoagulation due to previous GI bleed and anemia.  Continue beta-blocker and Cardizem.   Right foot swelling and erythema. PAD s/p left to right femorofemoral  bypass grafting 07/15/2019 Venous stasis and lymphedema. Continue torsemide metolazone. F???   Hyperlipidemia. Continue statin and zetia    CKD IIIb Continue supportive care.   GERD/Gastritis/esophagitis  - per primary team

## 2022-02-08 DIAGNOSIS — I214 Non-ST elevation (NSTEMI) myocardial infarction: Secondary | ICD-10-CM | POA: Diagnosis not present

## 2022-02-08 LAB — HEPARIN LEVEL (UNFRACTIONATED)
Heparin Unfractionated: <0.1 IU/mL — ABNORMAL LOW (ref 0.30–0.70)
Heparin Unfractionated: 0.14 IU/mL — ABNORMAL LOW (ref 0.30–0.70)
Heparin Unfractionated: 0.19 IU/mL — ABNORMAL LOW (ref 0.30–0.70)

## 2022-02-08 LAB — CBC
HCT: 31.5 % — ABNORMAL LOW (ref 39.0–52.0)
Hemoglobin: 11.1 g/dL — ABNORMAL LOW (ref 13.0–17.0)
MCH: 34.4 pg — ABNORMAL HIGH (ref 26.0–34.0)
MCHC: 35.2 g/dL (ref 30.0–36.0)
MCV: 97.5 fL (ref 80.0–100.0)
Platelets: 169 10*3/uL (ref 150–400)
RBC: 3.23 MIL/uL — ABNORMAL LOW (ref 4.22–5.81)
RDW: 13.1 % (ref 11.5–15.5)
WBC: 7.7 10*3/uL (ref 4.0–10.5)
nRBC: 0 % (ref 0.0–0.2)

## 2022-02-08 LAB — BASIC METABOLIC PANEL
Anion gap: 11 (ref 5–15)
BUN: 38 mg/dL — ABNORMAL HIGH (ref 8–23)
CO2: 25 mmol/L (ref 22–32)
Calcium: 8.4 mg/dL — ABNORMAL LOW (ref 8.9–10.3)
Chloride: 103 mmol/L (ref 98–111)
Creatinine, Ser: 1.94 mg/dL — ABNORMAL HIGH (ref 0.61–1.24)
GFR, Estimated: 35 mL/min — ABNORMAL LOW (ref >60)
Glucose, Bld: 83 mg/dL (ref 70–99)
Potassium: 3 mmol/L — ABNORMAL LOW (ref 3.5–5.1)
Sodium: 139 mmol/L (ref 135–145)

## 2022-02-08 MED ORDER — POTASSIUM CHLORIDE CRYS ER 20 MEQ PO TBCR
40.0000 meq | EXTENDED_RELEASE_TABLET | Freq: Once | ORAL | Status: AC
  Administered 2022-02-08: 40 meq via ORAL
  Filled 2022-02-08: qty 2

## 2022-02-08 MED ORDER — MAGNESIUM SULFATE 2 GM/50ML IV SOLN
2.0000 g | Freq: Once | INTRAVENOUS | Status: AC
  Administered 2022-02-08: 2 g via INTRAVENOUS
  Filled 2022-02-08: qty 50

## 2022-02-08 MED ORDER — HEPARIN BOLUS VIA INFUSION
1000.0000 [IU] | Freq: Once | INTRAVENOUS | Status: AC
  Administered 2022-02-08: 1000 [IU] via INTRAVENOUS
  Filled 2022-02-08: qty 1000

## 2022-02-08 MED ORDER — HEPARIN BOLUS VIA INFUSION
2000.0000 [IU] | Freq: Once | INTRAVENOUS | Status: AC
  Administered 2022-02-08: 2000 [IU] via INTRAVENOUS
  Filled 2022-02-08: qty 2000

## 2022-02-08 MED ORDER — POTASSIUM CHLORIDE 10 MEQ/100ML IV SOLN
10.0000 meq | INTRAVENOUS | Status: AC
  Administered 2022-02-08 (×4): 10 meq via INTRAVENOUS
  Filled 2022-02-08 (×4): qty 100

## 2022-02-08 NOTE — Progress Notes (Signed)
ANTICOAGULATION CONSULT NOTE - Follow Up Consult  Pharmacy Consult for Heparin Indication: chest pain/ACS  Allergies  Allergen Reactions   Codeine Nausea And Vomiting   Sulfa Antibiotics Rash   Sulfonamide Derivatives Rash   Oxycodone Nausea Only    Patient Measurements: Height: '5\' 5"'$  (165.1 cm) Weight: 57.4 kg (126 lb 8.7 oz) IBW/kg (Calculated) : 61.5 Heparin Dosing Weight: TBW  Vital Signs: Temp: 98.4 F (36.9 C) (12/05 0743) Temp Source: Oral (12/05 0743) BP: 116/72 (12/05 1216) Pulse Rate: 97 (12/05 1030)  Labs: Recent Labs    02/07/22 1200 02/07/22 1237 02/07/22 2251 02/08/22 0350 02/08/22 0415 02/08/22 1148  HGB 11.6* 11.2*  --  11.1*  --   --   HCT 33.0* 33.0*  --  31.5*  --   --   PLT 157  --   --  169  --   --   HEPARINUNFRC  --   --  <0.10* <0.10*  --  0.14*  CREATININE 2.20*  --   --   --  1.94*  --   TROPONINIHS 546*  --  1,717*  --   --   --      Estimated Creatinine Clearance: 25.9 mL/min (A) (by C-G formula based on SCr of 1.94 mg/dL (H)).   Medications:  Scheduled:   aspirin EC  81 mg Oral Daily   diltiazem  120 mg Oral Daily   dutasteride  0.5 mg Oral Daily   ezetimibe  10 mg Oral Daily   fluticasone  1 spray Each Nare Daily   furosemide  80 mg Intravenous BID   isosorbide mononitrate  30 mg Oral Daily   metoprolol succinate  25 mg Oral Daily   multivitamin with minerals  1 tablet Oral QPM   pantoprazole  40 mg Oral Daily   pravastatin  10 mg Oral q1800   ranolazine  500 mg Oral BID   senna-docusate  1 tablet Oral Daily   sodium chloride flush  3 mL Intravenous Q12H   Infusions:   sodium chloride     heparin 850 Units/hr (02/08/22 1216)   potassium chloride 10 mEq (02/08/22 1213)    Assessment: [name] presenting with chest pain and SOB. Not on anticoagulation prior to admission. Pharmacy consulted to dose heparin.  Heparin level low at 0.14 on repeat check, no infusion issues or bleeding noted. CBC stable overnight.    Goal  of Therapy:  Heparin level 0.3-0.7 units/ml Monitor platelets by anticoagulation protocol: Yes   Plan:  Heparin 2000 units IV x 1, and increase heparin gtt to 1000 units/hr F/u 8 hour heparin level  Erin Hearing PharmD., BCPS Clinical Pharmacist 02/08/2022 12:22 PM

## 2022-02-08 NOTE — Progress Notes (Signed)
PROGRESS NOTE    [name]  LGX:[id number] DOB: [birth year] DOA: 02/07/2022 PCP: Garwin Brothers, MD    Brief Narrative:  Patient is a 76 years old male with past medical history of coronary artery disease status post CABG in 1984 followed by redo CABG in 1996 history of PCI, CKD stage III, hypertension, hyperlipidemia, persistent atrial fibrillation not on anticoagulation, history of carotid artery disease status post carotid endarterectomy 2005, renal artery stenosis status post stenting presented to hospital with complaints of mid sternal chest pain with some radiation to the back and jaw with nausea and shortness of breath. .  He had been having some similar chest pain over the last week or so.  In the ED, patient was mildly hypotensive and was on 2 L of oxygen by nasal cannula.  Patient was noted to have elevated creatinine at 2.2 with elevated troponin at 546.  Chest x-ray showed increased right pleural effusion and superimposed pulmonary opacity.  EKG showed atrial fibrillation with right bundle branch block.Cardiology was consulted from the ED and was admitted to the hospital for further evaluation and treatment.  Assessment and plan.  Principal Problem:   NSTEMI (non-ST elevated myocardial infarction) ([place]) Active Problems:   RBBB (right bundle branch block with left anterior fascicular block)   Essential hypertension   PVD (peripheral vascular disease) ([facility])   Carotid stenosis   Hyperlipidemia LDL goal <81   Diastolic CHF ([facility]CC)   Bilateral lower extremity edema   PAD (peripheral artery disease) ([facility])   Stage 3b chronic kidney disease ([facility])   Non-ST elevation MI.   History of multivessel coronary artery disease status post CABG.   Denies chest pain and shortness of breath that improved with nitro.  Troponins were elevated did not show any acute ischemic changes.  Cardiology has seen the patient at this time and recommended IV heparin.  Patient does have underlying CKD stage  III and had multiple cardiac and vascular intervention in the past.  As per cardiology patient is not a good surgical candidate due to poor renal function and significant vascular disease severe aortic stenosis and underlying CAD.  Continue aspirin beta-blocker Cardizem Ranexa and Imdur.  Check 2D echocardiogram.  Cardiology suggesting palliative care evaluation.  Permanent A fib Not on anticoagulation due to previous GI bleed and anemia.  Continue beta-blocker and Cardizem.  Nonsustained VT.  Blood pressure was low this morning and metoprolol and Cardizem was on hold.  Had several episodes of nonsustained VT last night.  Has been changed to metoprolol succinate.  Potassium is low we will replace through IV and orally.  Give 1 dose of magnesium sulfate 2 g.  Check levels in AM.  Hypokalemia.  Potassium 3.0.  Had nonsustained V. tach.  We will aggressively replace.  Give magnesium sulfate 2 g as well.  Check levels in AM.   Right foot swelling and erythema. PAD s/p left to right femorofemoral  bypass grafting 07/15/2019 Venous stasis and lymphedema. Continue torsemide for now.   Hyperlipidemia. Continue statin and zetia    CKD IIIb Continue supportive care.   GERD/Gastritis/esophagitis  - per primary team       DVT prophylaxis: Heparin drip   Code Status:     Code Status: Full Code  Disposition:  Home likely in 1 to 2 days  Status is: Inpatient  Remains inpatient appropriate because: Non ST elevation MI on IV heparin drip, need for PT evaluation,   Family Communication:  Spoke with the patient's wife and  son at bedside on 02/07/2022.  I spoke with the patient's wife on the phone today.  Communicated about limited prognosis and palliative care evaluation..  Consultants:  Cardiology Palliative care  Procedures:  None  Antimicrobials:  None  Anti-infectives (From admission, onward)    None      Subjective: Today, patient was seen and examined at bedside.  Patient  was reported to have multiple episodes of ventricular tachycardia nonsustained.  Denies any chest pain or shortness of breath.  Blood pressure was reported to be low.  Objective: Vitals:   02/08/22 0600 02/08/22 0743 02/08/22 1030 02/08/22 1216  BP:  (!) 79/61 117/70 116/72  Pulse: 88 82 97 95  Resp: '16 17  16  '$ Temp:  98.4 F (36.9 C)  98.2 F (36.8 C)  TempSrc:  Oral  Oral  SpO2: 93% 96%  95%  Weight:      Height:        Intake/Output Summary (Last 24 hours) at 02/08/2022 1245 Last data filed at 02/08/2022 0954 Gross per 24 hour  Intake 284.97 ml  Output 950 ml  Net -665.03 ml   Filed Weights   02/07/22 1227 02/07/22 2223 02/08/22 0025  Weight: 59 kg 57.3 kg 57.4 kg   Physical Examination: Body mass index is 21.06 kg/m.   General:  Average built, not in obvious distress, on nasal oxygen HENT:   No scleral pallor or icterus noted. Oral mucosa is moist.  Chest:    Diminished breath sounds bilaterally. No crackles or wheezes.  CVS: S1 &S2 heard.  Irregularly irregular rhythm.  Systolic murmur noted. Abdomen: Soft, nontender, nondistended.  Bowel sounds are heard.   Extremities: No cyanosis, clubbing or edema.  Peripheral pulses are palpable. Psych: Alert, awake and oriented, normal mood CNS:  No cranial nerve deficits.  Power equal in all extremities.   Skin: Warm and dry.  No rashes noted.  Data Reviewed:   CBC: Recent Labs  Lab 02/07/22 1200 02/07/22 1237 02/08/22 0350  WBC 10.4  --  7.7  NEUTROABS 9.4*  --   --   HGB 11.6* 11.2* 11.1*  HCT 33.0* 33.0* 31.5*  MCV 98.5  --  97.5  PLT 157  --  834    Basic Metabolic Panel: Recent Labs  Lab 02/07/22 1200 02/07/22 1237 02/08/22 0415  NA 138 139 139  K 3.5 3.5 3.0*  CL 106  --  103  CO2 25  --  25  GLUCOSE 110*  --  83  BUN 41*  --  38*  CREATININE 2.20*  --  1.94*  CALCIUM 8.2*  --  8.4*    Liver Function Tests: Recent Labs  Lab 02/07/22 1200  AST 19  ALT 11  ALKPHOS 80  BILITOT 0.6  PROT  6.1*  ALBUMIN 3.2*     Radiology Studies: DG Chest Port 1 View  Result Date: 02/07/2022 CLINICAL DATA:  Chest pain EXAM: PORTABLE CHEST 1 VIEW COMPARISON:  CR 11/29/18 FINDINGS: Postsurgical changes from median sternotomy and CABG. There is slight interval increase in size of a right sided pleural effusion and superimposed hazy pulmonary opacity. No pneumothorax. No displaced rib fracture. There are prominent bilateral interstitial opacities, which are nonspecific, but could represent pulmonary venous congestion versus atypical infection. Cardiac and mediastinal contours are unchanged. Visualized upper abdomen is unremarkable. IMPRESSION: 1. Slight interval increase in size of a right sided pleural effusion and superimposed hazy pulmonary opacity, which could represent combination of pleural effusion and atelectasis, but superimposed  infection is not excluded. 2. There are prominent bilateral interstitial opacities, which are nonspecific, but could represent pulmonary venous congestion versus atypical infection. Electronically Signed   By: Marin Roberts M.D.   On: 02/07/2022 11:00      LOS: 1 day    Flora Lipps, MD [facility] Available via Epic secure chat 7am-7pm After these hours, please refer to coverage provider listed on [email or web address] 02/08/2022, 12:45 PM

## 2022-02-08 NOTE — Progress Notes (Signed)
PT Cancellation Note  Patient Details Name: [name] MRN: [id number] DOB: [birth year]   Cancelled Treatment:    Reason Eval/Treat Not Completed: Medical issues which prohibited therapy - PT awaiting further cardiology input regarding medical appropriateness for eval given "deeper ST depression in the anterolateral leads", awaiting palliative consult, etc. PT to check back.  Stacie Glaze, PT DPT Acute Rehabilitation Services Pager [phone]  Office [phone]    Louis Matte 02/08/2022, 3:51 PM

## 2022-02-08 NOTE — Progress Notes (Signed)
ANTICOAGULATION CONSULT NOTE - Follow Up Consult  Pharmacy Consult for Heparin Indication: chest pain/ACS  Allergies  Allergen Reactions   Codeine Nausea And Vomiting   Sulfa Antibiotics Rash   Sulfonamide Derivatives Rash   Oxycodone Nausea Only    Patient Measurements: Height: '5\' 5"'$  (165.1 cm) Weight: 57.4 kg (126 lb 8.7 oz) IBW/kg (Calculated) : 61.5 Heparin Dosing Weight: TBW  Vital Signs: Temp: 98.1 F (36.7 C) (12/05 2028) Temp Source: Oral (12/05 2028) BP: 121/68 (12/05 2028) Pulse Rate: 98 (12/05 2028)  Labs: Recent Labs    02/07/22 1200 02/07/22 1237 02/07/22 2251 02/07/22 2251 02/08/22 0350 02/08/22 0415 02/08/22 1148 02/08/22 2037  HGB 11.6* 11.2*  --   --  11.1*  --   --   --   HCT 33.0* 33.0*  --   --  31.5*  --   --   --   PLT 157  --   --   --  169  --   --   --   HEPARINUNFRC  --   --  <0.10*   < > <0.10*  --  0.14* 0.19*  CREATININE 2.20*  --   --   --   --  1.94*  --   --   TROPONINIHS 546*  --  1,717*  --   --   --   --   --    < > = values in this interval not displayed.     Estimated Creatinine Clearance: 25.9 mL/min (A) (by C-G formula based on SCr of 1.94 mg/dL (H)).   Medications:  Scheduled:   aspirin EC  81 mg Oral Daily   diltiazem  120 mg Oral Daily   dutasteride  0.5 mg Oral Daily   ezetimibe  10 mg Oral Daily   fluticasone  1 spray Each Nare Daily   furosemide  80 mg Intravenous BID   isosorbide mononitrate  30 mg Oral Daily   metoprolol succinate  25 mg Oral Daily   multivitamin with minerals  1 tablet Oral QPM   pantoprazole  40 mg Oral Daily   pravastatin  10 mg Oral q1800   ranolazine  500 mg Oral BID   senna-docusate  1 tablet Oral Daily   sodium chloride flush  3 mL Intravenous Q12H   Infusions:   sodium chloride     heparin 1,000 Units/hr (02/08/22 1235)    Assessment: 75 YOM presenting with chest pain and SOB. Not on anticoagulation prior to admission. Pharmacy consulted to dose heparin.  Heparin level =  0.19 after heparin bolus and increase to 1000 units/hr   Goal of Therapy:  Heparin level 0.3-0.7 units/ml Monitor platelets by anticoagulation protocol: Yes   Plan:  -Heparin 1000 units IV x 1, and increase heparin gtt to 1150 units/hr -Heparin level in 8 hours and daily wth CBC daily  Hildred Laser, PharmD Clinical Pharmacist **Pharmacist phone directory can now be found on [email or web address] ([email or web address]).  Listed under [place].

## 2022-02-08 NOTE — Progress Notes (Addendum)
Rounding Note    Patient Name: [name] Date of Encounter: 02/08/2022  [place] Cardiologist: Shelva Majestic, MD   Subjective   Denies any CP or SOB.   Inpatient Medications    Scheduled Meds:  aspirin EC  81 mg Oral Daily   diltiazem  120 mg Oral Daily   dutasteride  0.5 mg Oral Daily   ezetimibe  10 mg Oral Daily   fluticasone  1 spray Each Nare Daily   furosemide  80 mg Intravenous BID   isosorbide mononitrate  30 mg Oral Daily   metoprolol succinate  25 mg Oral Daily   multivitamin with minerals  1 tablet Oral QPM   pantoprazole  40 mg Oral Daily   pravastatin  10 mg Oral q1800   ranolazine  500 mg Oral BID   senna-docusate  1 tablet Oral Daily   sodium chloride flush  3 mL Intravenous Q12H   Continuous Infusions:  sodium chloride     heparin 850 Units/hr (02/08/22 0626)   potassium chloride 10 mEq (02/08/22 0913)   PRN Meds: sodium chloride, albuterol, gabapentin, metolazone, nitroGLYCERIN, ondansetron **OR** ondansetron (ZOFRAN) IV, sodium chloride flush   Vital Signs    Vitals:   02/08/22 0438 02/08/22 0500 02/08/22 0600 02/08/22 0743  BP: 100/62   (!) 79/61  Pulse: 84 86 88 82  Resp: '18 20 16 17  '$ Temp: 98.1 F (36.7 C)   98.4 F (36.9 C)  TempSrc: Oral   Oral  SpO2: 94% 95% 93% 96%  Weight:      Height:        Intake/Output Summary (Last 24 hours) at 02/08/2022 0959 Last data filed at 02/08/2022 0954 Gross per 24 hour  Intake 284.97 ml  Output 950 ml  Net -665.03 ml      02/08/2022   12:25 AM 02/07/2022   10:23 PM 02/07/2022   12:27 PM  Last 3 Weights  Weight (lbs) 126 lb 8.7 oz 126 lb 5.2 oz 130 lb  Weight (kg) 57.4 kg 57.3 kg 58.968 kg      Telemetry    Atrial fibrillation with several episode of NSVT- Personally Reviewed  ECG    Atrial fibrillation with ST depression in the anterolateral leads - Personally Reviewed  Physical Exam   GEN: No acute distress.   Neck: No JVD Cardiac: irregularly irregular,  no rubs, or gallops. 2/6 systolic murmur at RUSB Respiratory: Clear to auscultation bilaterally. GI: Soft, nontender, non-distended  MS: No edema; No deformity. Neuro:  Nonfocal  Psych: Normal affect   Labs    High Sensitivity Troponin:   Recent Labs  Lab 02/07/22 1200 02/07/22 2251  TROPONINIHS 546* 1,717*     Chemistry Recent Labs  Lab 02/07/22 1200 02/07/22 1237 02/08/22 0415  NA 138 139 139  K 3.5 3.5 3.0*  CL 106  --  103  CO2 25  --  25  GLUCOSE 110*  --  83  BUN 41*  --  38*  CREATININE 2.20*  --  1.94*  CALCIUM 8.2*  --  8.4*  PROT 6.1*  --   --   ALBUMIN 3.2*  --   --   AST 19  --   --   ALT 11  --   --   ALKPHOS 80  --   --   BILITOT 0.6  --   --   GFRNONAA 30*  --  35*  ANIONGAP 7  --  11  Lipids No results for input(s): "CHOL", "TRIG", "HDL", "LABVLDL", "LDLCALC", "CHOLHDL" in the last 168 hours.  Hematology Recent Labs  Lab 02/07/22 1200 02/07/22 1237 02/08/22 0350  WBC 10.4  --  7.7  RBC 3.35*  --  3.23*  HGB 11.6* 11.2* 11.1*  HCT 33.0* 33.0* 31.5*  MCV 98.5  --  97.5  MCH 34.6*  --  34.4*  MCHC 35.2  --  35.2  RDW 13.2  --  13.1  PLT 157  --  169   Thyroid No results for input(s): "TSH", "FREET4" in the last 168 hours.  BNP Recent Labs  Lab 02/07/22 1200  BNP 601.5*    DDimer No results for input(s): "DDIMER" in the last 168 hours.   Radiology    DG Chest Port 1 View  Result Date: 02/07/2022 CLINICAL DATA:  Chest pain EXAM: PORTABLE CHEST 1 VIEW COMPARISON:  CR 11/29/18 FINDINGS: Postsurgical changes from median sternotomy and CABG. There is slight interval increase in size of a right sided pleural effusion and superimposed hazy pulmonary opacity. No pneumothorax. No displaced rib fracture. There are prominent bilateral interstitial opacities, which are nonspecific, but could represent pulmonary venous congestion versus atypical infection. Cardiac and mediastinal contours are unchanged. Visualized upper abdomen is unremarkable.  IMPRESSION: 1. Slight interval increase in size of a right sided pleural effusion and superimposed hazy pulmonary opacity, which could represent combination of pleural effusion and atelectasis, but superimposed infection is not excluded. 2. There are prominent bilateral interstitial opacities, which are nonspecific, but could represent pulmonary venous congestion versus atypical infection. Electronically Signed   By: Marin Roberts M.D.   On: 02/07/2022 11:00    Cardiac Studies   Echo 10/19/2021  1. Left ventricular ejection fraction, by estimation, is 60 to 65%. The  left ventricle has normal function. The left ventricle has no regional  wall motion abnormalities. There is mild left ventricular hypertrophy.  Indeterminate diastolic filling due to  E-A fusion.   2. Right ventricular systolic function is normal. The right ventricular  size is normal. There is normal pulmonary artery systolic pressure.   3. Left atrial size was mild to moderately dilated.   4. The mitral valve is normal in structure. Mild mitral valve  regurgitation. No evidence of mitral stenosis.   5. Visually the valve has moderate to severe stenosis. DI 0.23, mean  gradient only 19 mmHg, but AVA 0.78 cm2 SVi 34.. The aortic valve is  calcified. There is moderate calcification of the aortic valve. There is  moderate thickening of the aortic valve.  Aortic valve regurgitation is not visualized. Moderate to severe aortic  valve stenosis. Aortic valve area, by VTI measures 0.78 cm. Aortic valve  mean gradient measures 19.0 mmHg.   6. The inferior vena cava is normal in size with greater than 50%  respiratory variability, suggesting right atrial pressure of 3 mmHg.   Patient Profile     76 y.o. male with PMH of CAD s/p CABG in [place] and redo CABG 1996 with multiple PCI, PAD w bilateral SFA occlusion, carotid artery disease s/p R CEA 2005, RBBB, renal artery disease s/p R renal artery stenting and repeat intervetion,  chronic L subclavian artery stenosis, HTN, HLD, CKD stage III and permanent atrial fibrillation not on AC due to GI bleed who presented with chest pain.   Assessment & Plan    NSTEMI  - Serial trop 546 --> 1717  - chest pain resolved after addition of Imdur '30mg'$  daily. Continue  IV heparin for 48 hours. Not good interventional candidate given significant vascular disease, poor renal function, severe AS and underlying CAD  - EKG this morning shows permanent afib with deeper ST depression in the anterolateral leads. Consider limited echo to look at LVEF. Palliative care  NSVT: both metoprolol and diltiazem held this morning due to low BP, instructed nurse to resume. Several episode of NSVT last night. Likely related to underlying ischemia. Consider consolidate metoprolol succinate and lisinopril to metoprolol succinate only.   Severe aortic stenosis: moderate to severe AS on last echo  CAD s/p CABG: on ASA, zetia and pravastatin  PAD: bilateral SFA occlusion, carotid artery disease s/p R CEA 2005, RBBB, renal artery disease s/p R renal artery stenting and repeat intervetion, chronic L subclavian artery stenosis  HTN: hypotensive this morning, BP improved to 117, will resume medication  HLD  Permanent atrial fibrillation: rate borderline controlled.   CKD: Cr 2.2 --> 1.8      For questions or updates, please contact [place] Please consult [email or web address] for contact info under        Signed, Almyra Deforest, [place]  02/08/2022, 9:59 AM    Patient seen, examined. Available data reviewed. Agree with findings, assessment, and plan as outlined by Almyra Deforest, PA.  The patient is independently interviewed and examined.  He is an elderly male in no distress at rest on O2 per nasal cannula.  Lungs are diminished in the bases but otherwise clear, JVP is normal, heart is irregularly irregular with grade 3/6 systolic murmur at the right upper sternal border, abdomen is soft nontender, lower  extremity edema is improving with continued redness right greater than left foot.  Otherwise no significant changes.  The patient is chest pain-free since he has been in the hospital.  States he is not short of breath at rest but he really has not moved around much to know and see if he has exertional dyspnea.  I think it is appropriate to continue with another dose of IV Lasix today.  Hopefully isosorbide will help manage his chest discomfort.  I had a frank discussion with him today and he understands that his problems related to coronary artery disease and aortic stenosis will get worse.  His comorbid conditions outlined in yesterday's note make him a poor candidate for interventional procedures.  I think his severe comorbidities of chronic kidney disease, severe peripheral arterial disease with history of femorofemoral bypass, kyphoscoliosis, left subclavian occlusion, and congestive heart failure, represent too many comorbidities for him to get through evaluation and treatment safely with TAVR or other interventional treatments.  I do not think he would have any vascular access for TAVR.  Medical therapy will be continued.  His telemetry is reviewed and showed episodes of nonsustained VT today.  This could be related to acute ischemic heart disease, old infarct, and aortic stenosis.  Continue beta-blocker.  Has been intolerant to amiodarone in the past.  Palliative care consultation might be appropriate.  I discussed this with the patient today and he is open to a palliative care evaluation, but states that he has plenty of support at home at this time.  Sherren Mocha, M.D. 02/08/2022 12:35 PM

## 2022-02-08 NOTE — Progress Notes (Signed)
   02/08/22 1511  Mobility  Activity Stood at bedside  Level of Assistance Standby assist, set-up cues, supervision of patient - no hands on  Assistive Device None  Distance Ambulated (ft) 0 ft  Activity Response Tolerated well  Mobility Referral Yes  $Mobility charge 1 Mobility   Mobility Specialist Progress Note  Pre-Mobility: 105 HR During Mobility: 135 HR  Post-Mobility: 115 HR  Pt was in bed and agreeable. Mobility cut short d/t HR rising. Will f/u as time permits. Left EOB w/ all needs met and call bell in reach. RN notified.   Lucious Groves Mobility Specialist  Please contact via SecureChat or Rehab office at [phone]

## 2022-02-08 NOTE — TOC Initial Note (Signed)
Transition of Care [facility]) - Initial/Assessment Note    Patient Details  Name: Mart Colpitts MRN: [id number] Date of Birth: [birth year]  [facility] CM/SW Contact:    Ninfa Meeker, RN Phone Number: 02/08/2022, 12:58 PM  Clinical Narrative:                  Transition of Care Screening Note Transition of Care [facility]) Department has reviewed patient and no TOC needs have been identified at this time. We will continue to monitor patient advancement through Interdisciplinary progressions and if new patient needs arise, please place a consult.        Patient Goals and CMS Choice        Expected Discharge Plan and Services                                                Prior Living Arrangements/Services                       Activities of Daily Living Home Assistive Devices/Equipment: None ADL Screening (condition at time of admission) Patient's cognitive ability adequate to safely complete daily activities?: Yes Is the patient deaf or have difficulty hearing?: Yes Does the patient have difficulty seeing, even when wearing glasses/contacts?: No Does the patient have difficulty concentrating, remembering, or making decisions?: No Patient able to express need for assistance with ADLs?: Yes Does the patient have difficulty dressing or bathing?: No Independently performs ADLs?: Yes (appropriate for developmental age) Does the patient have difficulty walking or climbing stairs?: No Weakness of Legs: None Weakness of Arms/Hands: None  Permission Sought/Granted                  Emotional Assessment              Admission diagnosis:  NSTEMI (non-ST elevated myocardial infarction) ([place]) [I21.4] Patient Active Problem List   Diagnosis Date Noted   Weakness of both lower extremities 01/31/2022   Stage 3b chronic kidney disease ([place]) 01/31/2022   History of foot ulcer 07/09/2020   Anemia in chronic kidney disease 03/15/2020    Iron deficiency anemia 03/15/2020   Onychomycosis due to dermatophyte 10/17/2019   S/P femoral-femoral bypass surgery 07/22/2019   PAD (peripheral artery disease) ([place]) 07/15/2019   Ischemic pain of right foot 04/02/2019   Paronychia of toe 03/25/2019   Peripheral vascular disease of foot ([place]) 03/19/2019   Gastritis and gastroduodenitis    Symptomatic anemia 10/10/2018   AKI (acute kidney injury) ([place]) 10/10/2018   Hypokalemia 10/10/2018   Bilateral lower extremity edema 08/17/2018   Malnutrition of moderate degree 07/25/2018   Dysphagia 07/24/2018   Volume overload 07/23/2018   Nausea and vomiting 07/23/2018   Chronic mesenteric ischemia (HCC)    NSTEMI (non-ST elevated myocardial infarction) ([place]) 07/12/2018   Fever 07/12/2018   Lobar pneumonia ([place]) 87/68/1157   Diastolic CHF ([place]) 26/20/3559   Chronic anticoagulation 04/19/2017   Subclavian artery stenosis, left (HCC)    Dyspnea    S/P CABG (coronary artery bypass graft)    Renal artery stenosis (HCC)    RBBB    Pyloric stenosis    Paroxysmal atrial fibrillation ([place])    Hyperlipidemia    Hiatal hernia    GERD (gastroesophageal reflux disease)    Benign esophageal stricture    Esophageal dilatation  Dysrhythmia    Complication of anesthesia    Colon polyps    Chronic back pain    Bronchitis    Atrial fibrillation (HCC)    Palpitations 06/16/2016   PAF (paroxysmal atrial fibrillation) (HCC)    Change in bowel habits 02/03/2014   CN (constipation) 02/03/2014   Rectal bleeding 02/03/2014   Hyperlipidemia LDL goal <70 01/16/2014   Carotid stenosis 12/10/2013   Aftercare following surgery of the circulatory system 12/10/2013   Carotid disease, bilateral ([place]) 03/06/2013   Anemia 01/26/2011   PVD (peripheral vascular disease) ([place]) 01/26/2011   RBBB (right bundle branch block with left anterior fascicular block) 01/26/2011   Crescendo angina ([place]) 01/25/2011   Presence of stent of bypass graft 01/25/2011    Coronary artery disease 01/24/2011   CONSTIPATION 01/25/2010   NAUSEA 08/06/2009   ABDOMINAL PAIN, GENERALIZED 08/06/2009   ABDOMINAL PAIN-MULTIPLE SITES 08/06/2009   INGUINAL HERNIA 03/12/2009   FLATULENCE-GAS-BLOATING 03/12/2009   Ulcer of esophagus without bleeding 01/30/2008   HYPERLIPIDEMIA 04/24/2007   Essential hypertension 04/24/2007   Hx of CABG '84, '96, last PCI 2012 04/24/2007   HEMORRHOIDS 04/24/2007   EROSIVE ESOPHAGITIS 04/24/2007   GASTROESOPHAGEAL REFLUX DISEASE 04/24/2007   PEPTIC STRICTURE 04/24/2007   Myocardial infarction ([place]) 03/07/1989   PCP:  Garwin Brothers, MD Pharmacy:   [id number] [place] [address] [id number]: [phone] Fax: [phone]    Social Determinants of Health (SDOH) Interventions    Readmission Risk Interventions    07/17/2019   11:26 AM  Readmission Risk Prevention Plan  Transportation Screening Complete  PCP or Specialist Appt within 3-5 Days Complete  [place]RI or [place]  Social Work Consult for [place]anning/Counseling Complete  Palliative Care Screening Not Applicable  Medication Review (Press photographer Complete

## 2022-02-08 NOTE — Progress Notes (Signed)
ANTICOAGULATION CONSULT NOTE - Follow Up Consult  Pharmacy Consult for Heparin Indication: chest pain/ACS  Allergies  Allergen Reactions   Codeine Nausea And Vomiting   Sulfa Antibiotics Rash   Sulfonamide Derivatives Rash   Oxycodone Nausea Only    Patient Measurements: Height: '5\' 5"'$  (165.1 cm) Weight: 57.4 kg (126 lb 8.7 oz) IBW/kg (Calculated) : 61.5 Heparin Dosing Weight: TBW  Vital Signs: Temp: 98 F (36.7 C) (12/05 0025) Temp Source: Oral (12/05 0025) BP: 109/56 (12/05 0025) Pulse Rate: 85 (12/05 0400)  Labs: Recent Labs    02/07/22 1200 02/07/22 1237 02/07/22 2251 02/08/22 0350  HGB 11.6* 11.2*  --  11.1*  HCT 33.0* 33.0*  --  31.5*  PLT 157  --   --  169  HEPARINUNFRC  --   --  <0.10* <0.10*  CREATININE 2.20*  --   --   --   TROPONINIHS 546*  --  1,717*  --      Estimated Creatinine Clearance: 22.8 mL/min (A) (by C-G formula based on SCr of 2.2 mg/dL (H)).   Medications:  Scheduled:   aspirin EC  81 mg Oral Daily   diltiazem  120 mg Oral Daily   dutasteride  0.5 mg Oral Daily   ezetimibe  10 mg Oral Daily   fluticasone  1 spray Each Nare Daily   furosemide  80 mg Intravenous BID   isosorbide mononitrate  30 mg Oral Daily   metoprolol succinate  25 mg Oral Daily   multivitamin with minerals  1 tablet Oral QPM   pantoprazole  40 mg Oral Daily   potassium chloride SA  20 mEq Oral Daily   pravastatin  10 mg Oral q1800   ranolazine  500 mg Oral BID   senna-docusate  1 tablet Oral Daily   sodium chloride flush  3 mL Intravenous Q12H   Infusions:   sodium chloride     heparin 700 Units/hr (02/08/22 0423)    Assessment: [name] presenting with chest pain and SOB. Not on anticoagulation prior to admission. Pharmacy consulted to dose heparin.  Heparin level undetectable with repeat lab, no infusion issues since last lab draw   Goal of Therapy:  Heparin level 0.3-0.7 units/ml Monitor platelets by anticoagulation protocol: Yes   Plan:  Heparin  2000 units IV x 1, and increase heparin gtt to 850 units/hr F/u 8 hour heparin level  Bertis Ruddy, PharmD, [place] Pharmacist ED Pharmacist Phone # [phone] 02/08/2022 4:27 AM

## 2022-02-09 DIAGNOSIS — N1832 Chronic kidney disease, stage 3b: Secondary | ICD-10-CM | POA: Diagnosis not present

## 2022-02-09 DIAGNOSIS — I214 Non-ST elevation (NSTEMI) myocardial infarction: Secondary | ICD-10-CM | POA: Diagnosis not present

## 2022-02-09 DIAGNOSIS — I350 Nonrheumatic aortic (valve) stenosis: Secondary | ICD-10-CM

## 2022-02-09 LAB — BASIC METABOLIC PANEL
Anion gap: 11 (ref 5–15)
BUN: 34 mg/dL — ABNORMAL HIGH (ref 8–23)
CO2: 23 mmol/L (ref 22–32)
Calcium: 8.6 mg/dL — ABNORMAL LOW (ref 8.9–10.3)
Chloride: 102 mmol/L (ref 98–111)
Creatinine, Ser: 1.89 mg/dL — ABNORMAL HIGH (ref 0.61–1.24)
GFR, Estimated: 36 mL/min — ABNORMAL LOW (ref >60)
Glucose, Bld: 103 mg/dL — ABNORMAL HIGH (ref 70–99)
Potassium: 3.8 mmol/L (ref 3.5–5.1)
Sodium: 136 mmol/L (ref 135–145)

## 2022-02-09 LAB — CBC
HCT: 32.3 % — ABNORMAL LOW (ref 39.0–52.0)
Hemoglobin: 11.2 g/dL — ABNORMAL LOW (ref 13.0–17.0)
MCH: 34.1 pg — ABNORMAL HIGH (ref 26.0–34.0)
MCHC: 34.7 g/dL (ref 30.0–36.0)
MCV: 98.5 fL (ref 80.0–100.0)
Platelets: 160 10*3/uL (ref 150–400)
RBC: 3.28 MIL/uL — ABNORMAL LOW (ref 4.22–5.81)
RDW: 13.2 % (ref 11.5–15.5)
WBC: 8 10*3/uL (ref 4.0–10.5)
nRBC: 0 % (ref 0.0–0.2)

## 2022-02-09 LAB — HEPARIN LEVEL (UNFRACTIONATED): Heparin Unfractionated: 0.38 IU/mL (ref 0.30–0.70)

## 2022-02-09 MED ORDER — HEPARIN SODIUM (PORCINE) 5000 UNIT/ML IJ SOLN
5000.0000 [IU] | Freq: Three times a day (TID) | INTRAMUSCULAR | Status: DC
  Administered 2022-02-10 – 2022-02-11 (×4): 5000 [IU] via SUBCUTANEOUS
  Filled 2022-02-09 (×4): qty 1

## 2022-02-09 MED ORDER — POTASSIUM CHLORIDE CRYS ER 20 MEQ PO TBCR
20.0000 meq | EXTENDED_RELEASE_TABLET | Freq: Once | ORAL | Status: AC
  Administered 2022-02-09: 20 meq via ORAL
  Filled 2022-02-09: qty 1

## 2022-02-09 MED ORDER — METOPROLOL SUCCINATE ER 25 MG PO TB24
25.0000 mg | ORAL_TABLET | Freq: Two times a day (BID) | ORAL | Status: DC
  Administered 2022-02-09 – 2022-02-11 (×4): 25 mg via ORAL
  Filled 2022-02-09 (×4): qty 1

## 2022-02-09 NOTE — Progress Notes (Addendum)
ANTICOAGULATION CONSULT NOTE - Follow Up Consult  Pharmacy Consult for Heparin Indication: chest pain/ACS  Allergies  Allergen Reactions   Codeine Nausea And Vomiting   Sulfa Antibiotics Rash   Sulfonamide Derivatives Rash   Oxycodone Nausea Only    Patient Measurements: Height: '5\' 5"'$  (165.1 cm) Weight: 56.9 kg (125 lb 8 oz) IBW/kg (Calculated) : 61.5 Heparin Dosing Weight: TBW  Vital Signs: Temp: 98.2 F (36.8 C) (12/06 0634) Temp Source: Oral (12/06 0634) BP: 125/69 (12/06 0634) Pulse Rate: 142 (12/06 0634)  Labs: Recent Labs    02/07/22 1200 02/07/22 1237 02/07/22 2251 02/07/22 2251 02/08/22 0350 02/08/22 0415 02/08/22 1148 02/08/22 2037 02/09/22 0545  HGB 11.6* 11.2*  --   --  11.1*  --   --   --  11.2*  HCT 33.0* 33.0*  --   --  31.5*  --   --   --  32.3*  PLT 157  --   --   --  169  --   --   --  160  HEPARINUNFRC  --   --  <0.10*   < > <0.10*  --  0.14* 0.19* 0.38  CREATININE 2.20*  --   --   --   --  1.94*  --   --  1.89*  TROPONINIHS 546*  --  1,717*  --   --   --   --   --   --    < > = values in this interval not displayed.     Estimated Creatinine Clearance: 26.3 mL/min (A) (by C-G formula based on SCr of 1.89 mg/dL (H)).   Medications:  Scheduled:   aspirin EC  81 mg Oral Daily   diltiazem  120 mg Oral Daily   dutasteride  0.5 mg Oral Daily   ezetimibe  10 mg Oral Daily   fluticasone  1 spray Each Nare Daily   furosemide  80 mg Intravenous BID   isosorbide mononitrate  30 mg Oral Daily   metoprolol succinate  25 mg Oral Daily   multivitamin with minerals  1 tablet Oral QPM   pantoprazole  40 mg Oral Daily   pravastatin  10 mg Oral q1800   ranolazine  500 mg Oral BID   senna-docusate  1 tablet Oral Daily   sodium chloride flush  3 mL Intravenous Q12H   Infusions:   sodium chloride     heparin 1,150 Units/hr (02/08/22 2140)    Assessment: [name] presenting with chest pain and SOB. Not on anticoagulation prior to admission. Pharmacy  consulted to dose heparin.  Heparin level came back therapeutic at 0.38, on 1150 units/hr. CBC stable - Hgb 11.2, plt 160. No s/sx of bleeding or infusion issues.   Goal of Therapy:  Heparin level 0.3-0.7 units/ml Monitor platelets by anticoagulation protocol: Yes   Plan:  -Continue heparin infusion at 1150 units/hr -Plan for heparin to stop on 02/09/2022 ~1400  Antonietta Jewel, PharmD, [place] Pharmacist  Phone: [phone] 02/09/2022 7:24 AM  Please check AMION for all [place] phone numbers After 10:00 PM, call [place] [phone]

## 2022-02-09 NOTE — Progress Notes (Addendum)
Rounding Note    Patient Name: [name] Date of Encounter: 02/09/2022  [place] Cardiologist: Shelva Majestic, MD   Subjective   No chest pain this morning, breathing is better.   Reports significant, painful scrotal edema.  Inpatient Medications    Scheduled Meds:  aspirin EC  81 mg Oral Daily   diltiazem  120 mg Oral Daily   dutasteride  0.5 mg Oral Daily   ezetimibe  10 mg Oral Daily   fluticasone  1 spray Each Nare Daily   furosemide  80 mg Intravenous BID   [START ON 02/10/2022] heparin injection (subcutaneous)  5,000 Units Subcutaneous Q8H   isosorbide mononitrate  30 mg Oral Daily   metoprolol succinate  25 mg Oral Daily   multivitamin with minerals  1 tablet Oral QPM   pantoprazole  40 mg Oral Daily   pravastatin  10 mg Oral q1800   ranolazine  500 mg Oral BID   senna-docusate  1 tablet Oral Daily   sodium chloride flush  3 mL Intravenous Q12H   Continuous Infusions:  sodium chloride     heparin 1,150 Units/hr (02/08/22 2140)   PRN Meds: sodium chloride, albuterol, gabapentin, metolazone, nitroGLYCERIN, ondansetron **OR** ondansetron (ZOFRAN) IV, sodium chloride flush   Vital Signs    Vitals:   02/08/22 2028 02/09/22 0116 02/09/22 0634 02/09/22 0846  BP: 121/68 (!) 124/50 125/69 123/89  Pulse: 98 77 (!) 142 93  Resp: '17 20 20 18  '$ Temp: 98.1 F (36.7 C) 98.1 F (36.7 C) 98.2 F (36.8 C) 98.2 F (36.8 C)  TempSrc: Oral Oral Oral Oral  SpO2: 95% 91% 92% 93%  Weight:  56.9 kg    Height:        Intake/Output Summary (Last 24 hours) at 02/09/2022 1132 Last data filed at 02/09/2022 0900 Gross per 24 hour  Intake 1037.64 ml  Output 1575 ml  Net -537.36 ml      02/09/2022    1:16 AM 02/08/2022   12:25 AM 02/07/2022   10:23 PM  Last 3 Weights  Weight (lbs) 125 lb 8 oz 126 lb 8.7 oz 126 lb 5.2 oz  Weight (kg) 56.926 kg 57.4 kg 57.3 kg      Telemetry    Afib rates 90, but intermittent episodes into the 140s - Personally  Reviewed  ECG    No new tracing  Physical Exam   GEN: No acute distress.   Neck: No JVD Cardiac: Irreg Irreg, 3/6 systolic murmur RUSB, no rubs, or gallops.  Respiratory: Clear to auscultation bilaterally. GI: Soft, nontender, non-distended  MS: 1+ pedal edema; No deformity. Neuro:  Nonfocal  Psych: Normal affect   Labs    High Sensitivity Troponin:   Recent Labs  Lab 02/07/22 1200 02/07/22 2251  TROPONINIHS 546* 1,717*     Chemistry Recent Labs  Lab 02/07/22 1200 02/07/22 1237 02/08/22 0415 02/09/22 0545  NA 138 139 139 136  K 3.5 3.5 3.0* 3.8  CL 106  --  103 102  CO2 25  --  25 23  GLUCOSE 110*  --  83 103*  BUN 41*  --  38* 34*  CREATININE 2.20*  --  1.94* 1.89*  CALCIUM 8.2*  --  8.4* 8.6*  PROT 6.1*  --   --   --   ALBUMIN 3.2*  --   --   --   AST 19  --   --   --   ALT 11  --   --   --  ALKPHOS 80  --   --   --   BILITOT 0.6  --   --   --   GFRNONAA 30*  --  35* 36*  ANIONGAP 7  --  11 11    Lipids No results for input(s): "CHOL", "TRIG", "HDL", "LABVLDL", "LDLCALC", "CHOLHDL" in the last 168 hours.  Hematology Recent Labs  Lab 02/07/22 1200 02/07/22 1237 02/08/22 0350 02/09/22 0545  WBC 10.4  --  7.7 8.0  RBC 3.35*  --  3.23* 3.28*  HGB 11.6* 11.2* 11.1* 11.2*  HCT 33.0* 33.0* 31.5* 32.3*  MCV 98.5  --  97.5 98.5  MCH 34.6*  --  34.4* 34.1*  MCHC 35.2  --  35.2 34.7  RDW 13.2  --  13.1 13.2  PLT 157  --  169 160   Thyroid No results for input(s): "TSH", "FREET4" in the last 168 hours.  BNP Recent Labs  Lab 02/07/22 1200  BNP 601.5*    DDimer No results for input(s): "DDIMER" in the last 168 hours.   Radiology    No results found.  Cardiac Studies   Echo: 10/2021  IMPRESSIONS     1. Left ventricular ejection fraction, by estimation, is 60 to 65%. The  left ventricle has normal function. The left ventricle has no regional  wall motion abnormalities. There is mild left ventricular hypertrophy.  Indeterminate diastolic  filling due to  E-A fusion.   2. Right ventricular systolic function is normal. The right ventricular  size is normal. There is normal pulmonary artery systolic pressure.   3. Left atrial size was mild to moderately dilated.   4. The mitral valve is normal in structure. Mild mitral valve  regurgitation. No evidence of mitral stenosis.   5. Visually the valve has moderate to severe stenosis. DI 0.23, mean  gradient only 19 mmHg, but AVA 0.78 cm2 SVi 34.. The aortic valve is  calcified. There is moderate calcification of the aortic valve. There is  moderate thickening of the aortic valve.  Aortic valve regurgitation is not visualized. Moderate to severe aortic  valve stenosis. Aortic valve area, by VTI measures 0.78 cm. Aortic valve  mean gradient measures 19.0 mmHg.   6. The inferior vena cava is normal in size with greater than 50%  respiratory variability, suggesting right atrial pressure of 3 mmHg.   FINDINGS   Left Ventricle: Left ventricular ejection fraction, by estimation, is 60  to 65%. The left ventricle has normal function. The left ventricle has no  regional wall motion abnormalities. The left ventricular internal cavity  size was normal in size. There is   mild left ventricular hypertrophy. Indeterminate diastolic filling due to  E-A fusion.   Right Ventricle: The right ventricular size is normal. No increase in  right ventricular wall thickness. Right ventricular systolic function is  normal. There is normal pulmonary artery systolic pressure. The tricuspid  regurgitant velocity is 2.19 m/s, and   with an assumed right atrial pressure of 8 mmHg, the estimated right  ventricular systolic pressure is 58.0 mmHg.   Left Atrium: Left atrial size was mild to moderately dilated.   Right Atrium: Right atrial size was normal in size.   Pericardium: There is no evidence of pericardial effusion.   Mitral Valve: The mitral valve is normal in structure. Mild mitral valve   regurgitation. No evidence of mitral valve stenosis.   Tricuspid Valve: The tricuspid valve is normal in structure. Tricuspid  valve regurgitation is mild . No  evidence of tricuspid stenosis.   Aortic Valve: Visually the valve has moderate to severe stenosis. DI 0.23,  mean gradient only 19 mmHg, but AVA 0.78 cm2 SVi 34. The aortic valve is  calcified. There is moderate calcification of the aortic valve. There is  moderate thickening of the aortic   valve. Aortic valve regurgitation is not visualized. Aortic regurgitation  PHT measures 325 msec. Moderate to severe aortic stenosis is present.  Aortic valve mean gradient measures 19.0 mmHg. Aortic valve peak gradient  measures 30.6 mmHg. Aortic valve  area, by VTI measures 0.78 cm.   Pulmonic Valve: The pulmonic valve was normal in structure. Pulmonic valve  regurgitation is not visualized. No evidence of pulmonic stenosis.   Aorta: The aortic root is normal in size and structure.   Venous: The inferior vena cava is normal in size with greater than 50%  respiratory variability, suggesting right atrial pressure of 3 mmHg.   IAS/Shunts: No atrial level shunt detected by color flow Doppler.   Patient Profile     76 y.o. male with PMH of CAD s/p CABG in [place] and redo CABG 1996 with multiple PCI, PAD w bilateral SFA occlusion, carotid artery disease s/p R CEA 2005, RBBB, renal artery disease s/p R renal artery stenting and repeat intervetion, chronic L subclavian artery stenosis, HTN, HLD, CKD stage III and permanent atrial fibrillation not on AC due to GI bleed who presented with chest pain.    Assessment & Plan    NSTEMI CAD s/p CABG (redo) -- hsTn 546>>1717. EKG was abnormal with ST depression in anterolateral leads but not felt to be a candidate for invasive work up. Being managed medically -- IV heparin for total of 48 hrs, continue ASA, statin, BB, Ranexa, Imdur -- no further episodes of chest pain  NSVT -- in the  setting of hypokalemia, now resolved with K+ 3.8 -- continue Toprol XL -- has been intolerant amiodarone in the past  Severe AS Dyspnea -- noted as moderate to severe on last echo 10/2021. Given his co-morbidities, renal disease and limited vascular access, he is not a candidate for TAVR -- medical management -- volume status improving, continue IV lasix through today  PAD -- bilateral SFA occlusion, carotid artery disease s/p R CEA 2005, RBBB, renal artery disease s/p R renal artery stenting and repeat intervetion, chronic L subclavian artery stenosis   HTN -- had some issues with hypotension this admission, stable this morning -- continue Dilt/metoprolol  HLD -- on pravastatin, zetia  Permanent Afib -- rate controlled on Diltiazem and metoprolol XL  CKD lllb -- stable, improving 2.2>>1.94>>1.89 -- follow BMET  Hypokalemia -- K+ 3.0>>3.8 this morning -- check mag in am  Scrotal edema -- reports episodes in the past -- will continue IV lasix -- management per primary  Palliative care consult pending  For questions or updates, please contact [place] Please consult [email or web address] for contact info under   Signed, Reino Bellis, NP  02/09/2022, 11:32 AM    Patient seen, examined. Available data reviewed. Agree with findings, assessment, and plan as outlined by Reino Bellis, NP.  Patient is independently interviewed and examined.  He feels better today but had some shortness of breath when lying down overnight.  On my exam, JVP is normal, lungs are diminished in the right base but otherwise clear, heart is irregularly irregular with 3/6 systolic murmur at the right upper sternal border, abdomen is soft and nontender, extremities have 1+ pedal edema which  is improved from previous, erythema is also improved.  Plan: Continue IV diuresis, continue metoprolol succinate and diltiazem for heart rate control, but increase metoprolol succinate to 25 mg twice daily as  blood pressure tolerates.  I had a lengthy discussion with the patient's wife today about his overall prognosis.  I have recommended palliative care consultation.  Patient does not seem quite ready for this, but is willing to meet with the palliative care team.  I think he is going to continue to decline because of his severe comorbidities, understanding the natural history of progressive aortic stenosis, and I have a high suspicion that he has progressive ischemic substrate as well.  Will order repeat chest x-ray tomorrow to assess his pleural effusion.  Otherwise we will continue current treatments.  Sherren Mocha, M.D. 02/09/2022 2:15 PM

## 2022-02-09 NOTE — Progress Notes (Signed)
Heart Failure Nurse Navigator Progress Note  Following this hospitalization to assess for HV TOC readiness. Pt has significant history of CABG + redo, many vascular interventions, CKD III, underlying CAD, severe AS. Not strong surgical candidate per cardiology note, suggesting palliative approach.   EF 60-65%, mild LVH, RV WNL.   Leaning toward no HV TOC clinic follow-up appointment as low benefit to patient.    Pricilla Holm, MSN, RN Heart Failure Nurse Navigator

## 2022-02-09 NOTE — Progress Notes (Signed)
   02/09/22 1000  Mobility  Activity Ambulated with assistance in room  Level of Assistance Contact guard assist, steadying assist  Assistive Device Front wheel walker  Distance Ambulated (ft) 6 ft  Activity Response Tolerated well  Mobility Referral Yes  $Mobility charge 1 Mobility   Mobility Specialist Progress Note  Pre-Mobility: 92 HR, 92% SpO2 During Mobility: 98- 125 HR, 91% SpO2 Post-Mobility: 91 HR,93% SpO2  Pt was in bed and agreeable. Was SOB during ambulation. Returned to chair w/ all needs met and call bell in reach.   Lucious Groves Mobility Specialist  Please contact via SecureChat or Rehab office at [phone]

## 2022-02-09 NOTE — Progress Notes (Signed)
  Progress Note   Patient: [name] HER:[id number] DOB: 01/24/1946 DOA: 02/07/2022     2 DOS: the patient was seen and examined on 02/09/2022   Brief hospital course: 76 year old man PMH including CABG, CKD presented with chest pain.  Admitted for further evaluation and treatment of NSTEMI.  Found to have severe AS also with NSVT.  Followed by cardiology.  Prognosis felt to be guarded and palliative consultation requested.  Appears stable today.  Assessment and Plan: NSTEMI CAD s/p CABG (redo) Managed medically, IV heparin total 48 hours.  Aspirin, statin, beta-blocker, Ranexa, Imdur. Currently stable.   NSVT In the setting of hypokalemia, now resolved.  Continue Toprol-XL.  Intolerant to amiodarone in the past.   Severe AS Dyspnea, volume overload Per cardiology given his co-morbidities, renal disease and limited vascular access, not a candidate for TAVR Continue medical management.  Continue IV diuresis.  Permanent A fib Not on anticoagulation due to previous GI bleed and anemia.  Continue beta-blocker and Cardizem.   Hypokalemia.   Resolved   PAD s/p left to right femorofemoral  bypass grafting 07/15/2019 Venous stasis and lymphedema. Continue torsemide for now.   Hyperlipidemia. Continue statin and zetia    CKD IIIb Stable.  Continue supportive care.      Subjective: feels ok  Physical Exam: Vitals:   02/09/22 0634 02/09/22 0846 02/09/22 1300 02/09/22 1538  BP: 125/69 123/89 90/63 (!) 81/61  Pulse: (!) 142 93 92 78  Resp: '20 18 16 16  '$ Temp: 98.2 F (36.8 C) 98.2 F (36.8 C) 98 F (36.7 C) (!) 97.2 F (36.2 C)  TempSrc: Oral Oral Oral Oral  SpO2: 92% 93% 97% 100%  Weight:      Height:       Physical Exam Vitals reviewed.  Constitutional:      General: He is not in acute distress.    Appearance: He is not ill-appearing or toxic-appearing.  Cardiovascular:     Rate and Rhythm: Normal rate and regular rhythm.     Heart sounds: No murmur  heard. Pulmonary:     Effort: Pulmonary effort is normal. No respiratory distress.     Breath sounds: No wheezing, rhonchi or rales.  Musculoskeletal:     Right lower leg: Edema present.     Left lower leg: Edema present.  Neurological:     Mental Status: He is alert.  Psychiatric:        Mood and Affect: Mood normal.        Behavior: Behavior normal.     Data Reviewed: Creatinine trending down, 1.89 Hgb stable 11.2  Family Communication: none  Disposition: Status is: Inpatient Remains inpatient appropriate because: volume overload  Planned Discharge Destination: Home    Time spent: 25 minutes  Author: Murray Hodgkins, MD 02/09/2022 7:41 PM  For on call review [email or web address].

## 2022-02-09 NOTE — Consult Note (Signed)
   [facility] CM Inpatient Consult   02/09/2022  Guled [facility] [place] [birth year] [id number]  [place] [facility] [ACO] Patient: [place] Medicare   Primary Care Provider:  Garwin Brothers, MD with [facility] noted   Patient screened for hospitalization with noted and discussed in progression meeting to assess for potential [place] Management service needs for post hospital transition for care coordination.  Review of patient's electronic medical record reveals patient is awaiting consults when medically stable with PT/OT/Palliative.  Plan:  Continue to follow progress and disposition to assess for post hospital community care coordination/management needs.  Referral request for community care coordination: based on outcomes/pending disposition and needs  Of note, [place] does not replace or interfere with any arrangements made by the Inpatient Transition of Care team.  For questions contact:   Natividad Brood, RN BSN [place]  [phone] business mobile phone Toll free office [phone]  *[place]  [phone] Fax number: [phone] [email or web address]

## 2022-02-09 NOTE — Progress Notes (Signed)
Palliative:  Chart reviewed extensively. Introduced palliative care. Spoke with wife, Pamala Hurry. Meeting today does not work. We discussed plan to meet tomorrow 12/7 at 11 am.  Juel Burrow, DNP, [facility] Palliative Medicine Team Team Phone # [phone]  Pager # [phone]  NO CHARGE

## 2022-02-09 NOTE — Evaluation (Addendum)
Physical Therapy Evaluation Patient Details Name: [name] MRN: [id number] DOB: Apr 14, 1945 Today's Date: 02/09/2022  History of Present Illness  76 yo male presents to [facility] on 12/4 with chest pain, alleviated by NTG and rest. Workup for NSTEMI In the ED, patient was mildly hypotensive and was on 2 L of oxygen by nasal cannula.  Patient was noted to have elevated creatinine at 2.2 with elevated troponin at 546.  Chest x-ray showed increased right pleural effusion and superimposed pulmonary opacity.  EKG showed atrial fibrillation with right bundle branch block.PMH includes afib, HF, CKD, DDD, HTN, HLD, Multivessel CAD with re-do CABG 1996 and multiple PCIs last on 2012, R femoral endarterectomy 07/2019.  Clinical Impression  PTA pt living with wife in single story home with 2 steps to enter. Pt ambulates with RW in home, Rollator for limited community distances and transport chair for longer community distances. Pt is independent with bathing and dressing, wife supervises shower transfers, able to complete iADLs with [place] assist. Pt is currently limited in safe mobility by decreased strength and endurance, SoB with short distance ambulation and increase in HR. Pt is supervision for transfers and ambulation with RW. PT recommends HHPT at discharge. PT will continue to follow acutely.       Recommendations for follow up therapy are one component of a multi-disciplinary discharge planning process, led by the attending physician.  Recommendations may be updated based on patient status, additional functional criteria and insurance authorization.  Follow Up Recommendations Home health PT      Assistance Recommended at Discharge Intermittent Supervision/Assistance  Patient can return home with the following  A little help with bathing/dressing/bathroom;A little help with walking and/or transfers;Assistance with cooking/housework;Assist for transportation;Help with stairs or ramp for entrance     Equipment Recommendations None recommended by PT  Recommendations for Other Services       Functional Status Assessment Patient has had a recent decline in their functional status and demonstrates the ability to make significant improvements in function in a reasonable and predictable amount of time.     Precautions / Restrictions Precautions Precautions: None      Mobility  Bed Mobility               General bed mobility comments: sitting in recliner on entry    Transfers Overall transfer level: Modified independent                 General transfer comment: slowed but steady power up and self steadying in RW    Ambulation/Gait Ambulation/Gait assistance: Supervision Gait Distance (Feet): 20 Feet Assistive device: Rolling walker (2 wheels) Gait Pattern/deviations: Step-through pattern, Decreased step length - right, Decreased step length - left, Trunk flexed, Shuffle Gait velocity: slowed Gait velocity interpretation: <1.31 ft/sec, indicative of household ambulator   General Gait Details: supervision for safety with slowed, shuffling gait, with increased flexion due to scoliosis, vc for proximity to RW for safety     Balance Overall balance assessment: Mild deficits observed, not formally tested                                           Pertinent Vitals/Pain Pain Assessment Pain Assessment: No/denies pain    Home Living Family/patient expects to be discharged to:: Private residence Living Arrangements: Spouse/significant other Available Help at Discharge: Family;Available 24 hours/day (wife works 4 hr a day at  [employer]) Type of Home: House Home Access: Stairs to enter Entrance Stairs-Rails:  (storm door) Entrance Stairs-Number of Steps: 2   Home Layout: One level Home Equipment: Conservation officer, nature (2 wheels);Rollator (4 wheels);Transport chair;Hospital bed      Prior Function Prior Level of Function : Independent/Modified  Independent             Mobility Comments: use walker in house and Rollator for limited community distances, and transport chair for longer distances ADLs Comments: wife supervises getting in and out of shower, pt does bathing and dressing     Hand Dominance   Dominant Hand: Right    Extremity/Trunk Assessment   Upper Extremity Assessment Upper Extremity Assessment: Overall WFL for tasks assessed    Lower Extremity Assessment Lower Extremity Assessment: Overall WFL for tasks assessed    Cervical / Trunk Assessment Cervical / Trunk Assessment: Other exceptions (scoliosis)  Communication   Communication: No difficulties  Cognition Arousal/Alertness: Awake/alert Behavior During Therapy: WFL for tasks assessed/performed Overall Cognitive Status: Within Functional Limits for tasks assessed                                          General Comments General comments (skin integrity, edema, etc.): SpO2 >90% on RA, HR mid 80-100s at rest with ambulation HR increased to 120s with increased breathlessness, recovers quickly with return to seated        Assessment/Plan    PT Assessment Patient needs continued PT services  PT Problem List Decreased activity tolerance;Decreased balance;Cardiopulmonary status limiting activity       PT Treatment Interventions DME instruction;Gait training;Stair training;Functional mobility training;Therapeutic activities;Therapeutic exercise;Balance training;Cognitive remediation;Patient/family education    PT Goals (Current goals can be found in the Care Plan section)  Acute Rehab PT Goals Patient Stated Goal: go home PT Goal Formulation: With patient Time For Goal Achievement: 02/23/22 Potential to Achieve Goals: Good    Frequency Min 3X/week     Co-evaluation               AM-PAC PT "6 Clicks" Mobility  Outcome Measure Help needed turning from your back to your side while in a flat bed without using bedrails?:  None Help needed moving from lying on your back to sitting on the side of a flat bed without using bedrails?: None Help needed moving to and from a bed to a chair (including a wheelchair)?: None Help needed standing up from a chair using your arms (e.g., wheelchair or bedside chair)?: None Help needed to walk in hospital room?: None Help needed climbing 3-5 steps with a railing? : A Little 6 Click Score: 23    End of Session Equipment Utilized During Treatment: Gait belt Activity Tolerance: Treatment limited secondary to medical complications (Comment) (SoB, increased HR) Patient left: in chair;with call bell/phone within reach Nurse Communication: Mobility status PT Visit Diagnosis: Muscle weakness (generalized) (M62.81);Difficulty in walking, not elsewhere classified (R26.2)    Time: 1884-1660 PT Time Calculation (min) (ACUTE ONLY): 27 min   Charges:   PT Evaluation $PT Eval Moderate Complexity: 1 Mod PT Treatments $Gait Training: 8-22 mins        Roger Fasnacht B. Migdalia Dk PT, DPT Acute Rehabilitation Services Please use secure chat or  Call Office [phone]   [place] 02/09/2022, 11:34 AM

## 2022-02-10 ENCOUNTER — Inpatient Hospital Stay (HOSPITAL_COMMUNITY): Payer: Medicare PPO

## 2022-02-10 DIAGNOSIS — Z515 Encounter for palliative care: Secondary | ICD-10-CM | POA: Diagnosis not present

## 2022-02-10 DIAGNOSIS — I350 Nonrheumatic aortic (valve) stenosis: Secondary | ICD-10-CM | POA: Diagnosis not present

## 2022-02-10 DIAGNOSIS — Z7189 Other specified counseling: Secondary | ICD-10-CM | POA: Diagnosis not present

## 2022-02-10 DIAGNOSIS — I214 Non-ST elevation (NSTEMI) myocardial infarction: Secondary | ICD-10-CM | POA: Diagnosis not present

## 2022-02-10 LAB — BASIC METABOLIC PANEL
Anion gap: 12 (ref 5–15)
BUN: 35 mg/dL — ABNORMAL HIGH (ref 8–23)
CO2: 24 mmol/L (ref 22–32)
Calcium: 8.9 mg/dL (ref 8.9–10.3)
Chloride: 100 mmol/L (ref 98–111)
Creatinine, Ser: 1.85 mg/dL — ABNORMAL HIGH (ref 0.61–1.24)
GFR, Estimated: 37 mL/min — ABNORMAL LOW (ref >60)
Glucose, Bld: 119 mg/dL — ABNORMAL HIGH (ref 70–99)
Potassium: 3.8 mmol/L (ref 3.5–5.1)
Sodium: 136 mmol/L (ref 135–145)

## 2022-02-10 LAB — CBC
HCT: 32.1 % — ABNORMAL LOW (ref 39.0–52.0)
Hemoglobin: 11.3 g/dL — ABNORMAL LOW (ref 13.0–17.0)
MCH: 33.6 pg (ref 26.0–34.0)
MCHC: 35.2 g/dL (ref 30.0–36.0)
MCV: 95.5 fL (ref 80.0–100.0)
Platelets: 153 10*3/uL (ref 150–400)
RBC: 3.36 MIL/uL — ABNORMAL LOW (ref 4.22–5.81)
RDW: 13.1 % (ref 11.5–15.5)
WBC: 7.4 10*3/uL (ref 4.0–10.5)
nRBC: 0 % (ref 0.0–0.2)

## 2022-02-10 LAB — MAGNESIUM: Magnesium: 2.3 mg/dL (ref 1.7–2.4)

## 2022-02-10 MED ORDER — TORSEMIDE 20 MG PO TABS
80.0000 mg | ORAL_TABLET | Freq: Two times a day (BID) | ORAL | Status: DC
  Administered 2022-02-10 – 2022-02-11 (×3): 80 mg via ORAL
  Filled 2022-02-10 (×3): qty 4

## 2022-02-10 NOTE — Progress Notes (Addendum)
Rounding Note    Patient Name: [name] Date of Encounter: 02/10/2022  [place] Cardiologist: Shelva Majestic, MD   Subjective   Feeling well this morning. HR improved. Hopeful to DC home soon.   Inpatient Medications    Scheduled Meds:  aspirin EC  81 mg Oral Daily   diltiazem  120 mg Oral Daily   dutasteride  0.5 mg Oral Daily   ezetimibe  10 mg Oral Daily   fluticasone  1 spray Each Nare Daily   furosemide  80 mg Intravenous BID   heparin injection (subcutaneous)  5,000 Units Subcutaneous Q8H   isosorbide mononitrate  30 mg Oral Daily   metoprolol succinate  25 mg Oral BID   multivitamin with minerals  1 tablet Oral QPM   pantoprazole  40 mg Oral Daily   pravastatin  10 mg Oral q1800   ranolazine  500 mg Oral BID   senna-docusate  1 tablet Oral Daily   sodium chloride flush  3 mL Intravenous Q12H   Continuous Infusions:  sodium chloride     PRN Meds: sodium chloride, albuterol, gabapentin, nitroGLYCERIN, ondansetron **OR** ondansetron (ZOFRAN) IV, sodium chloride flush   Vital Signs    Vitals:   02/09/22 2047 02/10/22 0115 02/10/22 0424 02/10/22 0822  BP:    130/67  Pulse: 76   78  Resp:    18  Temp:   98.3 F (36.8 C)   TempSrc:   Oral   SpO2:    96%  Weight:  56.4 kg    Height:        Intake/Output Summary (Last 24 hours) at 02/10/2022 0950 Last data filed at 02/10/2022 0823 Gross per 24 hour  Intake 953.07 ml  Output 1650 ml  Net -696.93 ml      02/10/2022    1:15 AM 02/09/2022    1:16 AM 02/08/2022   12:25 AM  Last 3 Weights  Weight (lbs) 124 lb 4.8 oz 125 lb 8 oz 126 lb 8.7 oz  Weight (kg) 56.382 kg 56.926 kg 57.4 kg      Telemetry    Afib rates mostly in the 90s, some rates in the 110s - Personally Reviewed  ECG    No new tracing this morning   Physical Exam   GEN: No acute distress.   Neck: No JVD Cardiac: Irreg Irreg, 3/6 systolic murmur RUSB, no rubs, or gallops.  Respiratory: Clear to auscultation  bilaterally. GI: Soft, nontender, non-distended  MS: mild pedal edema; No deformity. Neuro:  Nonfocal  Psych: Normal affect   Labs    High Sensitivity Troponin:   Recent Labs  Lab 02/07/22 1200 02/07/22 2251  TROPONINIHS 546* 1,717*     Chemistry Recent Labs  Lab 02/07/22 1200 02/07/22 1237 02/08/22 0415 02/09/22 0545 02/10/22 0124  NA 138   < > 139 136 136  K 3.5   < > 3.0* 3.8 3.8  CL 106  --  103 102 100  CO2 25  --  '25 23 24  '$ GLUCOSE 110*  --  83 103* 119*  BUN 41*  --  38* 34* 35*  CREATININE 2.20*  --  1.94* 1.89* 1.85*  CALCIUM 8.2*  --  8.4* 8.6* 8.9  MG  --   --   --   --  2.3  PROT 6.1*  --   --   --   --   ALBUMIN 3.2*  --   --   --   --  AST 19  --   --   --   --   ALT 11  --   --   --   --   ALKPHOS 80  --   --   --   --   BILITOT 0.6  --   --   --   --   GFRNONAA 30*  --  35* 36* 37*  ANIONGAP 7  --  '11 11 12   '$ < > = values in this interval not displayed.    Lipids No results for input(s): "CHOL", "TRIG", "HDL", "LABVLDL", "LDLCALC", "CHOLHDL" in the last 168 hours.  Hematology Recent Labs  Lab 02/08/22 0350 02/09/22 0545 02/10/22 0124  WBC 7.7 8.0 7.4  RBC 3.23* 3.28* 3.36*  HGB 11.1* 11.2* 11.3*  HCT 31.5* 32.3* 32.1*  MCV 97.5 98.5 95.5  MCH 34.4* 34.1* 33.6  MCHC 35.2 34.7 35.2  RDW 13.1 13.2 13.1  PLT 169 160 153   Thyroid No results for input(s): "TSH", "FREET4" in the last 168 hours.  BNP Recent Labs  Lab 02/07/22 1200  BNP 601.5*    DDimer No results for input(s): "DDIMER" in the last 168 hours.   Radiology    DG Chest 2 View  Result Date: 02/10/2022 CLINICAL DATA:  Congestive heart failure. EXAM: CHEST - 2 VIEW COMPARISON:  February 07, 2022. FINDINGS: The heart size and mediastinal contours are within normal limits. Status post coronary artery bypass graft. Mild diffuse interstitial densities are noted most consistent pulmonary edema. Small right pleural effusion is noted with associated subsegmental atelectasis. The  visualized skeletal structures are unremarkable. IMPRESSION: Probable bilateral pulmonary edema with small right pleural effusion. Electronically Signed   By: Marijo Conception M.D.   On: 02/10/2022 08:47    Cardiac Studies   Echo: 10/2021   IMPRESSIONS     1. Left ventricular ejection fraction, by estimation, is 60 to 65%. The  left ventricle has normal function. The left ventricle has no regional  wall motion abnormalities. There is mild left ventricular hypertrophy.  Indeterminate diastolic filling due to  E-A fusion.   2. Right ventricular systolic function is normal. The right ventricular  size is normal. There is normal pulmonary artery systolic pressure.   3. Left atrial size was mild to moderately dilated.   4. The mitral valve is normal in structure. Mild mitral valve  regurgitation. No evidence of mitral stenosis.   5. Visually the valve has moderate to severe stenosis. DI 0.23, mean  gradient only 19 mmHg, but AVA 0.78 cm2 SVi 34.. The aortic valve is  calcified. There is moderate calcification of the aortic valve. There is  moderate thickening of the aortic valve.  Aortic valve regurgitation is not visualized. Moderate to severe aortic  valve stenosis. Aortic valve area, by VTI measures 0.78 cm. Aortic valve  mean gradient measures 19.0 mmHg.   6. The inferior vena cava is normal in size with greater than 50%  respiratory variability, suggesting right atrial pressure of 3 mmHg.   FINDINGS   Left Ventricle: Left ventricular ejection fraction, by estimation, is 60  to 65%. The left ventricle has normal function. The left ventricle has no  regional wall motion abnormalities. The left ventricular internal cavity  size was normal in size. There is   mild left ventricular hypertrophy. Indeterminate diastolic filling due to  E-A fusion.   Right Ventricle: The right ventricular size is normal. No increase in  right ventricular wall thickness. Right ventricular systolic  function is  normal. There is normal pulmonary artery systolic pressure. The tricuspid  regurgitant velocity is 2.19 m/s, and   with an assumed right atrial pressure of 8 mmHg, the estimated right  ventricular systolic pressure is 99.8 mmHg.   Left Atrium: Left atrial size was mild to moderately dilated.   Right Atrium: Right atrial size was normal in size.   Pericardium: There is no evidence of pericardial effusion.   Mitral Valve: The mitral valve is normal in structure. Mild mitral valve  regurgitation. No evidence of mitral valve stenosis.   Tricuspid Valve: The tricuspid valve is normal in structure. Tricuspid  valve regurgitation is mild . No evidence of tricuspid stenosis.   Aortic Valve: Visually the valve has moderate to severe stenosis. DI 0.23,  mean gradient only 19 mmHg, but AVA 0.78 cm2 SVi 34. The aortic valve is  calcified. There is moderate calcification of the aortic valve. There is  moderate thickening of the aortic   valve. Aortic valve regurgitation is not visualized. Aortic regurgitation  PHT measures 325 msec. Moderate to severe aortic stenosis is present.  Aortic valve mean gradient measures 19.0 mmHg. Aortic valve peak gradient  measures 30.6 mmHg. Aortic valve  area, by VTI measures 0.78 cm.   Pulmonic Valve: The pulmonic valve was normal in structure. Pulmonic valve  regurgitation is not visualized. No evidence of pulmonic stenosis.   Aorta: The aortic root is normal in size and structure.   Venous: The inferior vena cava is normal in size with greater than 50%  respiratory variability, suggesting right atrial pressure of 3 mmHg.   IAS/Shunts: No atrial level shunt detected by color flow Doppler.     Patient Profile     76 y.o. male with PMH of CAD s/p CABG in [place] and redo CABG 1996 with multiple PCI, PAD w bilateral SFA occlusion, carotid artery disease s/p R CEA 2005, RBBB, renal artery disease s/p R renal artery stenting and repeat  intervetion, chronic L subclavian artery stenosis, HTN, HLD, CKD stage III and permanent atrial fibrillation not on AC due to GI bleed who presented with chest pain.    Assessment & Plan    NSTEMI CAD s/p CABG (redo) -- hsTn 546>>1717. EKG was abnormal with ST depression in anterolateral leads but not felt to be a candidate for invasive work up. Being managed medically given his advanced age and co-morbidities  -- treated with IV heparin for total of 48 hrs, continue ASA, statin, BB, Ranexa, Imdur -- no further episodes of chest pain since admission   NSVT -- in the setting of hypokalemia, now resolved with K+ 3.8 -- continue Toprol XL '25mg'$  BID -- has been intolerant amiodarone in the past   Severe AS Dyspnea -- noted as moderate to severe on last echo 10/2021. Given his co-morbidities, renal disease and limited vascular access, he is not a candidate for TAVR -- medical management -- volume status improving, weight trending down net - 1.7L. CXR with persistent small right sided pleural effusion, but this is noted on CXR back from 2020 as well -- will transition back to home oral torsemide '80mg'$  BID   PAD -- bilateral SFA occlusion, carotid artery disease s/p R CEA 2005, RBBB, renal artery disease s/p R renal artery stenting and repeat intervetion, chronic L subclavian artery stenosis    HTN -- had some issues with hypotension this admission, stable this morning -- continue Dilt/metoprolol   HLD -- on pravastatin, zetia   Permanent  Afib -- rate controlled on Diltiazem and metoprolol XL. Tolerated increase of Toprol to '25mg'$  BID   CKD lllb -- stable, improving 2.2>>1.94>>1.89>>1.85 -- follow BMET   Hypokalemia -- resolved, mag 2.3  Scrotal edema -- reports episodes in the past -- management per primary   Palliative care consult pending     For questions or updates, please contact [place] Please consult [email or web address] for contact info under         Signed, Reino Bellis, NP  02/10/2022, 9:50 AM    Patient seen, examined. Available data reviewed. Agree with findings, assessment, and plan as outlined by Reino Bellis, NP.  On exam the patient is alert, oriented, no distress.  JVP is normal, lungs are clear bilaterally, heart is irregularly irregular with a 3/6 harsh crescendo decrescendo murmur at the right upper sternal border, abdomen is soft and nontender, lower extremity edema is improving and there is less redness in his ankles.  The patient has improved significantly.  I have reviewed his medical program which includes an increased dose of metoprolol succinate to better control his heart rate.  The patient's renal function is improving even with diuresis.  I agree with transitioning him back to oral torsemide at his previous dose of 80 mg twice daily.  He was taking metolazone once weekly.  He should continue on the same regimen with potassium supplementation.  Appreciate palliative care consultation.  Will arrange outpatient cardiology follow-up.  Seems to be approaching medical stability for discharge.  Sherren Mocha, M.D. 02/10/2022 12:50 PM

## 2022-02-10 NOTE — Progress Notes (Signed)
Heart Failure Navigator Progress Note  Assessed for Heart & Vascular TOC clinic readiness.  Patient EF 60-65%,  Venous stasis and lymphedema, Palliative consult for goals of care.  Navigator will sign off at this time.   Earnestine Leys, BSN, Clinical cytogeneticist Only

## 2022-02-10 NOTE — Progress Notes (Signed)
  Progress Note   Patient: [name] HKV:[id number] DOB: Dec 01, 1945 DOA: 02/07/2022     3 DOS: the patient was seen and examined on 02/10/2022   Brief hospital course: 76 year old man PMH including CABG, CKD presented with chest pain.  Admitted for further evaluation and treatment of NSTEMI.  Found to have severe AS also with NSVT.  Followed by cardiology.  Prognosis felt to be guarded and palliative consultation requested.  Overall improving.  Likely home tomorrow.  Assessment and Plan: NSTEMI CAD s/p CABG (redo) Managed medically, status post IV heparin total 48 hours.  Aspirin, statin, beta-blocker, Ranexa, Imdur. Currently stable.  No further evaluation per cardiology.   NSVT In the setting of hypokalemia, now resolved.  Continue Toprol-XL.  Intolerant to amiodarone in the past.   Severe AS Dyspnea, volume overload Per cardiology given his co-morbidities, renal disease and limited vascular access, not a candidate for TAVR Continue medical management.  Diuretic changed to oral.   Permanent A fib Not on anticoagulation due to previous GI bleed and anemia.  Continue beta-blocker and Cardizem.   Hypokalemia.   Resolved   PAD s/p left to right femorofemoral  bypass grafting 07/15/2019 Venous stasis and lymphedema. Continue torsemide for now.   Hyperlipidemia. Continue statin and zetia    CKD IIIb Stable.  Continue supportive care.  Overall improving.  Anticipate discharge tomorrow.      Subjective:  Feels better No angina  Physical Exam: Vitals:   02/09/22 2047 02/10/22 0115 02/10/22 0424 02/10/22 0822  BP:    130/67  Pulse: 76   78  Resp:    18  Temp:   98.3 F (36.8 C)   TempSrc:   Oral   SpO2:    96%  Weight:  56.4 kg    Height:       Physical Exam Vitals reviewed.  Constitutional:      General: He is not in acute distress.    Appearance: He is not ill-appearing or toxic-appearing.  Cardiovascular:     Rate and Rhythm: Normal rate and regular  rhythm.     Heart sounds: No murmur heard. Pulmonary:     Effort: Pulmonary effort is normal. No respiratory distress.     Breath sounds: No wheezing, rhonchi or rales.  Musculoskeletal:     Right lower leg: No edema.     Left lower leg: No edema.  Neurological:     Mental Status: He is alert.  Psychiatric:        Mood and Affect: Mood normal.        Behavior: Behavior normal.     Data Reviewed: Creatinine stable 1.85, Mg2+ 2.3 Hgb stable 11.3 CXR my read, right pleural effusion bigger  Family Communication: wife at bedside  Disposition: Status is: Inpatient Remains inpatient appropriate because: NSTEMI  Planned Discharge Destination: Home    Time spent: 35 minutes  Author: Murray Hodgkins, MD 02/10/2022 5:58 PM  For on call review [email or web address].

## 2022-02-10 NOTE — Consult Note (Signed)
Consultation Note Date: 02/10/2022   Patient Name: [name]  DOB: 10-30-1945  MRN: [id number]  Age / Sex: 76 y.o., male  PCP: Garwin Brothers, MD Referring Physician: Samuella Cota, MD  Reason for Consultation: Establishing goals of care  HPI/Patient Profile: 76 y.o. male  with past medical history of PVD, CAD, CABG, carotid stenosis, and CKD  admitted on 02/07/2022 with chest pain.  Treated for non-STEMI.  Found to have severe aortic stenosis.  Not a candidate for TAVR.  PMT consulted to discuss goals of care.  Clinical Assessment and Goals of Care: I have reviewed medical records including EPIC notes, labs and imaging, assessed the patient and then met with patient and wife Pamala Hurry to discuss diagnosis prognosis, [place], EOL wishes, disposition and options.  I introduced Palliative Medicine as specialized medical care for people living with serious illness. It focuses on providing relief from the symptoms and stress of a serious illness. The goal is to improve quality of life for both the patient and the family.  As far as functional and nutritional status patient tells me he has been mostly stable over the past few months.  He tells me he uses a walker for mobilization as he is limited due to his back pain.  He tells me about extreme fatigue and weakness at home.  He tells me about limited appetite.   We discussed patient's current illness and what it means in the larger context of patient's on-going co-morbidities.  Natural disease trajectory and expectations at EOL were discussed.  We discussed his multiple cardiac issues.  We discussed he is currently stabilized and is preparing for discharge but is at high risk for rehospitalization related to multiple issues.  He expresses understanding.  He understands limitations of medicine and that he is not a great candidate for interventions typically offered in the situations.  I attempted to  elicit values and goals of care important to the patient.    The difference between aggressive medical intervention and comfort care was considered in light of the patient's goals of care.   Introduced a MOST form and asked him to speak about these options with his wife.  They agreed to review and discuss later.  Also provided hard choices book for him and wife to review.  Discussed with patient the importance of continued conversation with family and the medical providers regarding overall plan of care and treatment options, ensuring decisions are within the context of the patients values and GOCs.    Palliative Care services outpatient were explained and offered.  Patient unsure if he would like to receive outpatient palliative care but he is agreeable to referral.  Questions and concerns were addressed. The family was encouraged to call with questions or concerns.  Primary Decision Maker PATIENT Spouse of patient unable  SUMMARY OF RECOMMENDATIONS   Initial goals of care discussion MOST introduced Hard choices book provided Referred to outpatient palliative care  Code Status/Advance Care Planning: Full code      Primary Diagnoses: Present on Admission:  NSTEMI (non-ST elevated myocardial infarction) ([place])  RBBB (right bundle branch block with left anterior fascicular block)  Bilateral lower extremity edema  Carotid stenosis  Diastolic CHF ([place])  PAD (peripheral artery disease) ([place])  Stage 3b chronic kidney disease ([employer])  Essential hypertension  Hyperlipidemia LDL goal <70  PVD (peripheral vascular disease) ([place])   I have reviewed the medical record, interviewed the patient and family, and examined the patient. The following aspects are pertinent.  Past Medical History:  Diagnosis Date   Anemia    Angina    Aortic stenosis    Atrial fibrillation ([place])    Blood transfusion    Bronchitis    Bulging discs    "8 of them; thoracic, lumbar, sacral area"   CHF  (congestive heart failure) (HCC)    Chronic back pain    Chronic kidney disease    "mild kidney disease"   Colon polyps    Coronary artery disease 01/24/11   Successful PCI long segmental stensois  vein graft to the CX marginal vessel-graft previously stented prox. 3.0x36m TAXUS stent mid seg 3.0x261mTAXUS stent now tandem stens of 3.0x3868m 3.0x8mm41maced with the seg. 50,80 & 99% stenosis reduced to 0%   DDD (degenerative disc disease)    Diastolic heart failure (HCC)[place] Dysrhythmia    "PAC's and PVC's"   Esophageal dilatation    "have had it done 7 times; last time ~ 2001"   Esophageal stricture    GERD (gastroesophageal reflux disease)    Headache(784.0)    Heart murmur    Hiatal hernia    Hyperlipidemia    Hypertension    [facility]   Myocardial infarction (HCC)[place]05   "had 3 heart attacks this year"   Paroxysmal atrial fibrillation (HCC)[place] Pneumonia 2020   Pyloric stenosis    RBBB    Renal artery stenosis (HCC)    hhistory of right renal artery stenting and re-intervention for "in-stent restenosis.   S/[place] CAB[place] (coronary artery bypass graft[place]    "when I had the heart attacks"   Subclavian artery stenosis, left (HCC)    Ulcer    "small; Dr. PerrHenrene Pastornd it 02/2010"   Social History   Socioeconomic History   Marital status: Married    Spouse name: Not on file   Number of children: Not on file   Years of education: Not on file   Highest education level: Not on file  Occupational History   Not on file  Tobacco Use   Smoking status: Former    Packs/day: 1.00    Years: 38.00    Total pack years: 38.00    Types: Cigarettes    Quit date: 02/09/2005    Years since quitting: 17.0    Passive exposure: Never   Smokeless tobacco: Never   Tobacco comments:    "quit smoking cigarrettes 2006"  Vaping Use   Vaping Use: Never used  Substance and Sexual Activity   Alcohol use: No     Alcohol/week: 0.0 standard drinks of alcohol   Drug use: No   Sexual activity: Not on file  Other Topics Concern   Not on file  Social History Narrative   Not on file   Social Determinants of Health   Financial Resource Strain: Low Risk  (07/18/2018)   Overall Financial Resource Strain (CARDIA)    Difficulty of Paying Living Expenses: Not hard at all  Food Insecurity: No Food Insecurity (02/07/2022)   Hunger Vital Sign    Worried About Running Out of Food in the Last Year: Never true    [place] Last Year: Never true  Transportation Needs: No Transportation Needs (02/07/2022)   PRAPARE - TranHydrologistdical): No    Lack of Transportation (Non-Medical): No  Physical Activity: Inactive (07/18/2018)  Exercise Vital Sign    Days of Exercise per Week: 0 days    Minutes of Exercise per Session: 0 min  Stress: No Stress Concern Present (07/18/2018)   [place]    Feeling of Stress : Only a little  Social Connections: Unknown (07/18/2018)   Social Connection and Isolation Panel [NHANES]    Frequency of Communication with Friends and Family: Not on file    Frequency of Social Gatherings with Friends and Family: Not on file    Attends Religious Services: Not on file    Active Member of Clubs or Organizations: No    Attends Archivist Meetings: Never    Marital Status: Married   Family History  Problem Relation Age of Onset   Breast cancer Paternal Aunt    Diabetes Sister    Ulcers Father    Heart disease Father    Heart failure Father    Heart attack Father    AAA (abdominal aortic aneurysm) Father    Stroke Mother    Hypertension Mother    Colon cancer Neg Hx    Scheduled Meds:  aspirin EC  81 mg Oral Daily   diltiazem  120 mg Oral Daily   dutasteride  0.5 mg Oral Daily   ezetimibe  10 mg Oral Daily   fluticasone  1 spray Each Nare Daily   heparin  injection (subcutaneous)  5,000 Units Subcutaneous Q8H   isosorbide mononitrate  30 mg Oral Daily   metoprolol succinate  25 mg Oral BID   multivitamin with minerals  1 tablet Oral QPM   pantoprazole  40 mg Oral Daily   pravastatin  10 mg Oral q1800   ranolazine  500 mg Oral BID   senna-docusate  1 tablet Oral Daily   sodium chloride flush  3 mL Intravenous Q12H   torsemide  80 mg Oral BID   Continuous Infusions:  sodium chloride     PRN Meds:.sodium chloride, albuterol, gabapentin, nitroGLYCERIN, ondansetron **OR** ondansetron (ZOFRAN) IV, sodium chloride flush Allergies  Allergen Reactions   Codeine Nausea And Vomiting   Sulfa Antibiotics Rash   Sulfonamide Derivatives Rash   Amiodarone     Developed tremor ultimately requiring discontinuation   Oxycodone Nausea Only   Review of Systems  Constitutional:  Positive for activity change, appetite change and fatigue.  Respiratory:  Positive for shortness of breath.   Neurological:  Positive for weakness.    Physical Exam Constitutional:      General: He is not in acute distress.    Appearance: He is ill-appearing.  Pulmonary:     Effort: Pulmonary effort is normal.  Skin:    General: Skin is warm and dry.  Neurological:     Mental Status: He is alert and oriented to person, place, and time.     Vital Signs: BP 130/67 (BP Location: Right Arm)   Pulse 78   Temp 98.3 F (36.8 C) (Oral)   Resp 18   Ht _0  (1.651 m)   Wt 56.4 kg   SpO2 96%   BMI 20.68 kg/m  Pain Scale: 0-10   Pain Score: 0-No pain   SpO2: SpO2: 96 % O2 Device:SpO2: 96 % O2 Flow Rate: .O2 Flow Rate (L/min): 2 L/min  IO: Intake/output summary:  Intake/Output Summary (Last 24 hours) at 02/10/2022 1505 Last data filed at 02/10/2022 0823 Gross per 24 hour  Intake 569.45 ml  Output 1350 ml  Net -780.55 ml  LBM: Last BM Date : 02/08/22 Baseline Weight: Weight: 59 kg Most recent weight: Weight: 56.4 kg     Palliative Assessment/Data: PPS  60%     *Please note that this is a verbal dictation therefore any spelling or grammatical errors are due to the "[place]" system interpretation.   Juel Burrow, DNP, AGNP-C Palliative Medicine Team [phone] Pager: [phone]

## 2022-02-10 NOTE — Progress Notes (Signed)
   02/10/22 1500  Mobility  Activity Ambulated with assistance in hallway  Level of Assistance Contact guard assist, steadying assist  Assistive Device Front wheel walker  Distance Ambulated (ft) 60 ft  Activity Response Tolerated well  Mobility Referral Yes  $Mobility charge 1 Mobility   Mobility Specialist Progress Note  Pre-Mobility: 77 HR, 106/67 BP, 96% SpO2 During Mobility: 102 HR, 93% SpO2 Post-Mobility: 88 HR  Pt was in bed and agreeable. Had no c/o pain throughout ambulation. Returned to bed w/ all needs met and call bell in reach.   Lucious Groves Mobility Specialist  Please contact via SecureChat or Rehab office at [phone]

## 2022-02-10 NOTE — Care Management Important Message (Signed)
Important Message  Patient Details  Name: [name] MRN: [id number] Date of Birth: September 03, 1945   Medicare Important Message Given:  Yes     Shelda Altes 02/10/2022, 12:48 PM

## 2022-02-10 NOTE — TOC Progression Note (Signed)
Transition of Care [facility]) - Progression Note    Patient Details  Name: [name] MRN: [id number] Date of Birth: 25-Feb-1946  Transition of Care [facility]) CM/SW Contact  Loletha Grayer Beverely Pace, RN Phone Number: 02/10/2022, 1:00 PM  Clinical Narrative:    Case manager spoke with patient's wife, [name], to discuss need for [place]. She states they have used [facility] in the past and she wants to use them now. Referral called to [facility], [place]. Patient has rollator, walker, and oxygen if needed. Has family support at home when medically ready for discharge.    Expected Discharge Plan: [place] Barriers to Discharge: Continued Medical Work up  Expected Discharge Plan and Services Expected Discharge Plan: [place]   Discharge Planning Services: CM Consult Post Acute Care Choice: [place] arrangements for the past 2 months: Single Family Home                 DME Arranged: N/A DME Agency: NA       HH Arranged: PT HH Agency: [place] Date [place]: 02/10/22 Time HH Agency Contacted: 1300 Representative spoke with at [place]: [place] ([place]) Interventions    Readmission Risk Interventions    07/17/2019   11:26 AM  Readmission Risk Prevention Plan  Transportation Screening Complete  PCP or Specialist Appt within 3-5 Days Complete  HRI or [place] Complete  Social Work Consult for [place] Planning/Counseling Complete  Palliative Care Screening Not Applicable  Medication Review Press photographer) Complete

## 2022-02-11 DIAGNOSIS — N1832 Chronic kidney disease, stage 3b: Secondary | ICD-10-CM | POA: Diagnosis not present

## 2022-02-11 DIAGNOSIS — I214 Non-ST elevation (NSTEMI) myocardial infarction: Secondary | ICD-10-CM | POA: Diagnosis not present

## 2022-02-11 DIAGNOSIS — I350 Nonrheumatic aortic (valve) stenosis: Secondary | ICD-10-CM | POA: Diagnosis not present

## 2022-02-11 LAB — BASIC METABOLIC PANEL
Anion gap: 14 (ref 5–15)
BUN: 37 mg/dL — ABNORMAL HIGH (ref 8–23)
CO2: 23 mmol/L (ref 22–32)
Calcium: 8.9 mg/dL (ref 8.9–10.3)
Chloride: 98 mmol/L (ref 98–111)
Creatinine, Ser: 1.89 mg/dL — ABNORMAL HIGH (ref 0.61–1.24)
GFR, Estimated: 36 mL/min — ABNORMAL LOW (ref >60)
Glucose, Bld: 115 mg/dL — ABNORMAL HIGH (ref 70–99)
Potassium: 4 mmol/L (ref 3.5–5.1)
Sodium: 135 mmol/L (ref 135–145)

## 2022-02-11 LAB — CBC
HCT: 31.7 % — ABNORMAL LOW (ref 39.0–52.0)
Hemoglobin: 11.3 g/dL — ABNORMAL LOW (ref 13.0–17.0)
MCH: 34.6 pg — ABNORMAL HIGH (ref 26.0–34.0)
MCHC: 35.6 g/dL (ref 30.0–36.0)
MCV: 96.9 fL (ref 80.0–100.0)
Platelets: 147 10*3/uL — ABNORMAL LOW (ref 150–400)
RBC: 3.27 MIL/uL — ABNORMAL LOW (ref 4.22–5.81)
RDW: 13.2 % (ref 11.5–15.5)
WBC: 6.5 10*3/uL (ref 4.0–10.5)
nRBC: 0 % (ref 0.0–0.2)

## 2022-02-11 MED ORDER — ISOSORBIDE MONONITRATE ER 30 MG PO TB24: 30.0000 mg | ORAL_TABLET | Freq: Every day | ORAL | 2 refills | Status: AC

## 2022-02-11 MED ORDER — METOPROLOL SUCCINATE ER 25 MG PO TB24: 25.0000 mg | ORAL_TABLET | Freq: Two times a day (BID) | ORAL | 2 refills | Status: AC

## 2022-02-11 NOTE — TOC Transition Note (Signed)
Transition of Care [facility]) - CM/SW Discharge Note   Patient Details  Name: Canaan Holzer MRN: [id number] Date of Birth: [birth year]  Transition of Care [facility]) CM/SW Contact:  Pollie Friar, RN Phone Number: 02/11/2022, 11:50 AM   Clinical Narrative:    Pt is discharging home with home health services through [place]. Information on the AVS.  Pt has supervision at home and transportation to home.   Final next level of care: Home w Home Health Services Barriers to Discharge: No Barriers Identified   Patient Goals and CMS Choice   CMS Medicare.gov Compare Post Acute Care list provided to:: Patient Represenative (must comment) Choice offered to / list presented to : Spouse  Discharge Placement                       Discharge Plan and Services   Discharge Planning Services: CM Consult Post Acute Care Choice: Home Health          DME Arranged: N/A DME Agency: NA       HH Arranged: PT HH Agency: [place] Date [facility] Agency Contacted: 02/10/22 Time HH Agency Contacted: 51 Representative spoke with at [place]: [place] ([place]) Interventions     Readmission Risk Interventions    07/17/2019   11:26 AM  Readmission Risk Prevention Plan  Transportation Screening Complete  PCP or Specialist Appt within 3-5 Days Complete  HRI or [place] Complete  Social Work Consult for [place] Planning/Counseling Complete  Palliative Care Screening Not Applicable  Medication Review Press photographer) Complete

## 2022-02-11 NOTE — Plan of Care (Signed)
PT discharging to home with family.  Problem: Education: Goal: Ability to demonstrate management of disease process will improve Outcome: Adequate for Discharge Goal: Ability to verbalize understanding of medication therapies will improve Outcome: Adequate for Discharge Goal: Individualized Educational Video(s) Outcome: Adequate for Discharge   Problem: Activity: Goal: Capacity to carry out activities will improve Outcome: Adequate for Discharge   Problem: Cardiac: Goal: Ability to achieve and maintain adequate cardiopulmonary perfusion will improve Outcome: Adequate for Discharge   Problem: Education: Goal: Knowledge of General Education information will improve Description: Including pain rating scale, medication(s)/side effects and non-pharmacologic comfort measures Outcome: Adequate for Discharge   Problem: Health Behavior/Discharge Planning: Goal: Ability to manage health-related needs will improve Outcome: Adequate for Discharge   Problem: Clinical Measurements: Goal: Ability to maintain clinical measurements within normal limits will improve Outcome: Adequate for Discharge Goal: Will remain free from infection Outcome: Adequate for Discharge Goal: Diagnostic test results will improve Outcome: Adequate for Discharge Goal: Respiratory complications will improve Outcome: Adequate for Discharge Goal: Cardiovascular complication will be avoided Outcome: Adequate for Discharge   Problem: Activity: Goal: Risk for activity intolerance will decrease Outcome: Adequate for Discharge   Problem: Nutrition: Goal: Adequate nutrition will be maintained Outcome: Adequate for Discharge   Problem: Coping: Goal: Level of anxiety will decrease Outcome: Adequate for Discharge   Problem: Elimination: Goal: Will not experience complications related to bowel motility Outcome: Adequate for Discharge Goal: Will not experience complications related to urinary retention Outcome:  Adequate for Discharge   Problem: Pain Managment: Goal: General experience of comfort will improve Outcome: Adequate for Discharge   Problem: Safety: Goal: Ability to remain free from injury will improve Outcome: Adequate for Discharge   Problem: Skin Integrity: Goal: Risk for impaired skin integrity will decrease Outcome: Adequate for Discharge

## 2022-02-11 NOTE — Progress Notes (Signed)
Physical Therapy Treatment Patient Details Name: [name] MRN: [id number] DOB: 29-Jul-1945 Today's Date: 02/11/2022   History of Present Illness 76 yo male [facility] with chest pain, alleviated by NTG and rest. Workup for NSTEMI In the ED, patient was mildly hypotensive and was on 2 L of oxygen by nasal cannula.  Patient was noted to have elevated creatinine at 2.2 with elevated troponin at 546.  Chest x-ray showed increased right pleural effusion and superimposed pulmonary opacity.  EKG showed atrial fibrillation with right bundle branch block.PMH includes afib, HF, CKD, DDD, HTN, HLD, Multivessel CAD with re-do CABG 1996 and multiple PCIs last on 2012, R femoral endarterectomy 07/2019.    PT Comments    Session focused on providing pt with education in regards to a walking program and exercises to address his lower extremity weakness upon d/c. Focused session also on balance and strength training as pt already ambulated earlier and was planning to d/c home soon. Pt demonstrating deficits in dynamic standing balance and eccentric quadriceps strength. Will continue to follow acutely. Current recommendations remain appropriate.     Recommendations for follow up therapy are one component of a multi-disciplinary discharge planning process, led by the attending physician.  Recommendations may be updated based on patient status, additional functional criteria and insurance authorization.  Follow Up Recommendations  Home health PT     Assistance Recommended at Discharge Intermittent Supervision/Assistance  Patient can return home with the following A little help with bathing/dressing/bathroom;A little help with walking and/or transfers;Assistance with cooking/housework;Assist for transportation;Help with stairs or ramp for entrance   Equipment Recommendations  None recommended by PT    Recommendations for Other Services       Precautions / Restrictions  Precautions Precautions: None Restrictions Weight Bearing Restrictions: No     Mobility  Bed Mobility               General bed mobility comments: sitting in recliner on entry    Transfers Overall transfer level: Modified independent Equipment used: None               General transfer comment: Pt able to come to stand from recliner > 7x without assistance. Pt with instability when trying to do so without using UEs but no LOB, but able to demonstrate improved stability with light fingertip support on arm rests.    Ambulation/Gait               General Gait Details: Deferred, focused session on pt education and exercises/balance training   Stairs             Wheelchair Mobility    Modified Rankin (Stroke Patients Only)       Balance Overall balance assessment: Mild deficits observed, not formally tested                                          Cognition Arousal/Alertness: Awake/alert Behavior During Therapy: WFL for tasks assessed/performed Overall Cognitive Status: Within Functional Limits for tasks assessed                                          Exercises Other Exercises Other Exercises: sit <> stand 5x in a row (3x without UE support, 2x with bil finger tips  support), cuing for eccentric control to sit Other Exercises: static and dynamic sitting and standing balance challenged through pt reaching off BOS to donn lower body clothing Other Exercises: educated pt on progressing gait distance/time and exercises at home    General Comments General comments (skin integrity, edema, etc.): educated pt on energy conservation, progressing mobility, and exercises for d/c      Pertinent Vitals/Pain Pain Assessment Pain Assessment: Faces Faces Pain Scale: No hurt Pain Intervention(s): Monitored during session    Home Living                          Prior Function            PT Goals (current  goals can now be found in the care plan section) Acute Rehab PT Goals Patient Stated Goal: go home PT Goal Formulation: With patient Time For Goal Achievement: 02/23/22 Potential to Achieve Goals: Good Progress towards PT goals: Progressing toward goals    Frequency    Min 3X/week      PT Plan Current plan remains appropriate    Co-evaluation              AM-PAC PT "6 Clicks" Mobility   Outcome Measure  Help needed turning from your back to your side while in a flat bed without using bedrails?: None Help needed moving from lying on your back to sitting on the side of a flat bed without using bedrails?: None Help needed moving to and from a bed to a chair (including a wheelchair)?: None Help needed standing up from a chair using your arms (e.g., wheelchair or bedside chair)?: None Help needed to walk in hospital room?: None Help needed climbing 3-5 steps with a railing? : A Little 6 Click Score: 23    End of Session   Activity Tolerance: Patient tolerated treatment well Patient left: in chair;with call bell/phone within reach   PT Visit Diagnosis: Muscle weakness (generalized) (M62.81);Difficulty in walking, not elsewhere classified (R26.2)     Time: 6222-9798 PT Time Calculation (min) (ACUTE ONLY): 15 min  Charges:  $Therapeutic Exercise: 8-22 mins                     [name], PT, DPT Acute Rehabilitation Services  Office: [phone]    Orvan Falconer 02/11/2022, 2:12 PM

## 2022-02-11 NOTE — Progress Notes (Signed)
Mobility Specialist Progress Note    02/11/22 1112  Mobility  Activity Ambulated with assistance in hallway  Level of Assistance Contact guard assist, steadying assist  Assistive Device Front wheel walker  Distance Ambulated (ft) 240 ft  Activity Response Tolerated well  Mobility Referral Yes  $Mobility charge 1 Mobility   Pre-Mobility: 79 HR, 111/55 (71) BP, 96% SpO2 During Mobility: 128 HR Post-Mobility: 87 HR, 128/67 (81) BP, 96% SpO2  Pt received in bed and agreeable. No complaints on walk. Returned to chair with call bell in reach.    Hildred Alamin Mobility Specialist  Please Psychologist, sport and exercise or Rehab Office at [phone]

## 2022-02-11 NOTE — Telephone Encounter (Signed)
Active Nexium prescription is on file at [facility]. Patient made aware. Advised patient to keep his appointment on 03/15/22. He has not been seen by our providers since 2020.

## 2022-02-11 NOTE — Telephone Encounter (Signed)
Outgoing call to patient to reschedule his 12/14 appt, patient is wondering if he could get another refill of his Nexium to hold him until his new 1/9 appt. Please advise

## 2022-02-11 NOTE — Discharge Summary (Signed)
Physician Discharge Summary   Patient: [name] MRN: [id number] DOB: [birth year]  Admit date:     02/07/2022  Discharge date: 02/11/22  Discharge Physician: Murray Hodgkins   PCP: Garwin Brothers, MD   Recommendations at discharge:  NSTEMI Severe AS   Discharge Diagnoses: Principal Problem:   NSTEMI (non-ST elevated myocardial infarction) [facility]) Active Problems:   RBBB (right bundle branch block with left anterior fascicular block)   Essential hypertension   PVD (peripheral vascular disease) ([place])   Carotid stenosis   Hyperlipidemia LDL goal <41   Diastolic CHF ([place])   Bilateral lower extremity edema   PAD (peripheral artery disease) ([facility])   Stage 3b chronic kidney disease ([place])   Severe aortic stenosis  Resolved Problems:   * No resolved hospital problems. *  Hospital Course: 76 year old man PMH including CABG, CKD presented with chest pain.  Admitted for further evaluation and treatment of NSTEMI.  Found to have severe AS also with NSVT.  Followed by cardiology.  Prognosis felt to be guarded and palliative consultation requested.  However overall improved significantly and was cleared for discharge from cardiology.  NSTEMI CAD s/p CABG ([facility]) Managed medically, status post IV heparin total 48 hours. Aspirin, statin, beta-blocker, Ranexa, Imdur. Currently stable.  No further evaluation per cardiology.   NSVT In the setting of hypokalemia, now resolved.  Continue Toprol-XL '25mg'$  BID.  Intolerant to amiodarone in the past.   Severe AS Dyspnea, volume overload Per cardiology given his co-morbidities, renal disease and limited vascular access, not a candidate for TAVR Continue medical management.  Diuretic changed to oral. torsemide '80mg'$  BID  He was taking metolazone once weekly. He should continue on the same regimen with potassium supplementation    Permanent A fib Not on anticoagulation due to previous GI bleed and anemia.  Continue beta-blocker and Cardizem.    Hypokalemia.   Resolved   PAD s/p left to right femorofemoral  bypass grafting 07/15/2019 Venous stasis and lymphedema. Continue torsemide    Hyperlipidemia. Continue statin and zetia    CKD IIIb Stable.  Continue supportive care.   Mobility Level: 4 Movement: 40f   Disposition  PT: HH Pt: HH Family: HH  Consultants:  Cardiology   Procedures performed:  None   Disposition: Home health Diet recommendation:  Cardiac diet DISCHARGE MEDICATION: Allergies as of 02/11/2022       Reactions   Codeine Nausea And Vomiting   Sulfa Antibiotics Rash   Sulfonamide Derivatives Rash   Amiodarone    Developed tremor ultimately requiring discontinuation   Oxycodone Nausea Only        Medication List     TAKE these medications    acetaminophen 500 MG tablet Commonly known as: TYLENOL Take 1,000 mg by mouth every 6 (six) hours as needed for moderate pain.   aspirin EC 81 MG tablet Take 1 tablet (81 mg total) by mouth daily.   butalbital-aspirin-caffeine 50-325-40 MG capsule Commonly known as: FIORINAL Take 1 capsule by mouth daily as needed for headache.   diltiazem 120 MG 24 hr capsule Commonly known as: CARDIZEM CD TAKE 1 CAPSULE BY MOUTH EVERY DAY What changed: how much to take   dutasteride 0.5 MG capsule Commonly known as: AVODART Take 0.5 mg by mouth daily.   esomeprazole 40 MG capsule Commonly known as: NEXIUM TAKE 1 CAPSULE (40 MG TOTAL) BY MOUTH 2 (TWO) TIMES DAILY BEFORE A MEAL.   ezetimibe 10 MG tablet Commonly known as: ZETIA TAKE 1 TABLET BY MOUTH EVERY  DAY   fluticasone 50 MCG/ACT nasal spray Commonly known as: FLONASE Place 1 spray into both nostrils daily.   Horizant 600 MG Tbcr Generic drug: Gabapentin Enacarbil Take 600 mg by mouth daily as needed (restless leg). (1700)   isosorbide mononitrate 30 MG 24 hr tablet Commonly known as: IMDUR Take 1 tablet (30 mg total) by mouth daily. Start taking on: February 12, 2022   Klor-Con  M20 20 MEQ tablet Generic drug: potassium chloride SA Take 20 mEq by mouth once a week. On Wednesday's   loratadine 10 MG tablet Commonly known as: CLARITIN Take 10 mg by mouth daily as needed for allergies, rhinitis or itching.   lovastatin 40 MG tablet Commonly known as: MEVACOR TAKE 1 TABLET BY MOUTH EVERYDAY AT BEDTIME What changed: See the new instructions.   metolazone 2.5 MG tablet Commonly known as: ZAROXOLYN Take 2.5 mg by mouth once a week. On Wednesday's   metoprolol succinate 25 MG 24 hr tablet Commonly known as: TOPROL-XL Take 1 tablet (25 mg total) by mouth 2 (two) times daily. What changed: See the new instructions.   multivitamins ther. w/minerals Tabs tablet Take 1 tablet by mouth every evening.   nitroGLYCERIN 0.4 MG/SPRAY spray Commonly known as: NITROLINGUAL PLACE 1 SPRAY UNDER THE TONGUE EVERY 5 (FIVE) MINUTES X 3 DOSES AS NEEDED FOR CHEST PAIN. What changed: Another medication with the same name was changed. Make sure you understand how and when to take each.   nitroGLYCERIN 0.4 MG SL tablet Commonly known as: NITROSTAT PLACE 1 TABLET UNDER THE TONGUE EVERY 5 MINUTES AS NEEDED. What changed: See the new instructions.   ranolazine 500 MG 12 hr tablet Commonly known as: RANEXA TAKE 1 TABLET BY MOUTH TWICE A DAY   senna-docusate 8.6-50 MG tablet Commonly known as: Senokot-S Take 1 tablet by mouth daily.   sucralfate 1 GM/10ML suspension Commonly known as: CARAFATE Take 10 mLs (1 g total) by mouth 4 (four) times daily -  with meals and at bedtime.   torsemide 20 MG tablet Commonly known as: DEMADEX Take 80 mg by mouth 2 (two) times daily.   vitamin E 200 UNIT capsule Take 200 Units by mouth every evening.        Follow-up Information     [facility] Follow up.   Specialty: [place] Why: A representative from [facility] will contact you to arrange start date and time for yo9ur therapy. Contact  information: [address] [place] [address] [phone]         Troy Sine, MD Follow up.   Specialty: Cardiology Why: Office will contact you with an appointment Contact information: [address] [place] [address] [phone]                Feels fine No CP Breathing ok  Discharge Exam: Filed Weights   02/09/22 0116 02/10/22 0115 02/11/22 0552  Weight: 56.9 kg 56.4 kg 57.1 kg   Physical Exam Vitals reviewed.  Cardiovascular:     Rate and Rhythm: Normal rate and regular rhythm.     Heart sounds: No murmur heard. Pulmonary:     Effort: Pulmonary effort is normal. No respiratory distress.     Breath sounds: No wheezing, rhonchi or rales.  Musculoskeletal:     Right lower leg: No edema.     Left lower leg: No edema.  Neurological:     Mental Status: He is alert.  Psychiatric:  Mood and Affect: Mood normal.        Behavior: Behavior normal.      Condition at discharge: good  The results of significant diagnostics from this hospitalization (including imaging, microbiology, ancillary and laboratory) are listed below for reference.   Imaging Studies: DG Chest 2 View  Result Date: 02/10/2022 CLINICAL DATA:  Congestive heart failure. EXAM: CHEST - 2 VIEW COMPARISON:  February 07, 2022. FINDINGS: The heart size and mediastinal contours are within normal limits. Status post coronary artery bypass graft. Mild diffuse interstitial densities are noted most consistent pulmonary edema. Small right pleural effusion is noted with associated subsegmental atelectasis. The visualized skeletal structures are unremarkable. IMPRESSION: Probable bilateral pulmonary edema with small right pleural effusion. Electronically Signed   By: Marijo Conception M.D.   On: 02/10/2022 08:47   DG Chest Port 1 View  Result Date: 02/07/2022 CLINICAL DATA:  Chest pain EXAM: PORTABLE CHEST 1 VIEW COMPARISON:  CR 11/29/18 FINDINGS: Postsurgical  changes from median sternotomy and CABG. There is slight interval increase in size of a right sided pleural effusion and superimposed hazy pulmonary opacity. No pneumothorax. No displaced rib fracture. There are prominent bilateral interstitial opacities, which are nonspecific, but could represent pulmonary venous congestion versus atypical infection. Cardiac and mediastinal contours are unchanged. Visualized upper abdomen is unremarkable. IMPRESSION: 1. Slight interval increase in size of a right sided pleural effusion and superimposed hazy pulmonary opacity, which could represent combination of pleural effusion and atelectasis, but superimposed infection is not excluded. 2. There are prominent bilateral interstitial opacities, which are nonspecific, but could represent pulmonary venous congestion versus atypical infection. Electronically Signed   By: Marin Roberts M.D.   On: 02/07/2022 11:00    Microbiology: Results for orders placed or performed during the hospital encounter of 02/07/22  Resp Panel by RT-PCR (Flu A&B, Covid) Anterior Nasal Swab     Status: None   Collection Time: 02/07/22 10:38 AM   Specimen: Anterior Nasal Swab  Result Value Ref Range Status   SARS Coronavirus 2 by RT PCR NEGATIVE NEGATIVE Final    Comment: (NOTE) SARS-CoV-2 target nucleic acids are NOT DETECTED.  The SARS-CoV-2 RNA is generally detectable in upper respiratory specimens during the acute phase of infection. The lowest concentration of SARS-CoV-2 viral copies this assay can detect is 138 copies/mL. A negative result does not preclude SARS-Cov-2 infection and should not be used as the sole basis for treatment or other patient management decisions. A negative result may occur with  improper specimen collection/handling, submission of specimen other than nasopharyngeal swab, presence of viral mutation(s) within the areas targeted by this assay, and inadequate number of viral copies(<138 copies/mL). A negative  result must be combined with clinical observations, patient history, and epidemiological information. The expected result is Negative.  Fact Sheet for Patients:  EntrepreneurPulse.com.au  Fact Sheet for Healthcare Providers:  IncredibleEmployment.be  This test is no t yet approved or cleared by the [place] FDA and  has been authorized for detection and/or diagnosis of SARS-CoV-2 by FDA under an Emergency Use Authorization (EUA). This EUA will remain  in effect (meaning this test can be used) for the duration of the COVID-19 declaration under Section 564(b)(1) of the Act, 21 U.S.C.section 360bbb-3(b)(1), unless the authorization is terminated  or revoked sooner.       Influenza A by PCR NEGATIVE NEGATIVE Final   Influenza B by PCR NEGATIVE NEGATIVE Final    Comment: (NOTE) The Xpert Xpress SARS-CoV-2/FLU/RSV plus assay is intended  as an aid in the diagnosis of influenza from Nasopharyngeal swab specimens and should not be used as a sole basis for treatment. Nasal washings and aspirates are unacceptable for Xpert Xpress SARS-CoV-2/FLU/RSV testing.  Fact Sheet for Patients: EntrepreneurPulse.com.au  Fact Sheet for Healthcare Providers: IncredibleEmployment.be  This test is not yet approved or cleared by the [place] FDA and has been authorized for detection and/or diagnosis of SARS-CoV-2 by FDA under an Emergency Use Authorization (EUA). This EUA will remain in effect (meaning this test can be used) for the duration of the COVID-19 declaration under Section 564(b)(1) of the Act, 21 U.S.C. section 360bbb-3(b)(1), unless the authorization is terminated or revoked.  Performed at [facility], [place] [address] [place], [place] [address]     Labs: CBC: Recent Labs  Lab 02/07/22 1200 02/07/22 1237 02/08/22 0350 02/09/22 0545 02/10/22 0124 02/11/22 0108  WBC 10.4  --  7.7 8.0 7.4 6.5   NEUTROABS 9.4*  --   --   --   --   --   HGB 11.6* 11.2* 11.1* 11.2* 11.3* 11.3*  HCT 33.0* 33.0* 31.5* 32.3* 32.1* 31.7*  MCV 98.5  --  97.5 98.5 95.5 96.9  PLT 157  --  169 160 153 286*   Basic Metabolic Panel: Recent Labs  Lab 02/07/22 1200 02/07/22 1237 02/08/22 0415 02/09/22 0545 02/10/22 0124 02/11/22 0108  NA 138 139 139 136 136 135  K 3.5 3.5 3.0* 3.8 3.8 4.0  CL 106  --  103 102 100 98  CO2 25  --  '25 23 24 23  '$ GLUCOSE 110*  --  83 103* 119* 115*  BUN 41*  --  38* 34* 35* 37*  CREATININE 2.20*  --  1.94* 1.89* 1.85* 1.89*  CALCIUM 8.2*  --  8.4* 8.6* 8.9 8.9  MG  --   --   --   --  2.3  --    Liver Function Tests: Recent Labs  Lab 02/07/22 1200  AST 19  ALT 11  ALKPHOS 80  BILITOT 0.6  PROT 6.1*  ALBUMIN 3.2*   CBG: No results for input(s): "GLUCAP" in the last 168 hours.  Discharge time spent: less than 30 minutes.  Signed: Murray Hodgkins, MD [facility] 02/11/2022

## 2022-02-13 ENCOUNTER — Other Ambulatory Visit: Payer: Self-pay | Admitting: Cardiovascular Disease

## 2022-02-14 DIAGNOSIS — I130 Hypertensive heart and chronic kidney disease with heart failure and stage 1 through stage 4 chronic kidney disease, or unspecified chronic kidney disease: Secondary | ICD-10-CM | POA: Diagnosis not present

## 2022-02-14 DIAGNOSIS — I5032 Chronic diastolic (congestive) heart failure: Secondary | ICD-10-CM | POA: Diagnosis not present

## 2022-02-14 DIAGNOSIS — I214 Non-ST elevation (NSTEMI) myocardial infarction: Secondary | ICD-10-CM | POA: Diagnosis not present

## 2022-02-14 DIAGNOSIS — I25110 Atherosclerotic heart disease of native coronary artery with unstable angina pectoris: Secondary | ICD-10-CM | POA: Diagnosis not present

## 2022-02-17 ENCOUNTER — Ambulatory Visit: Payer: Medicare PPO | Admitting: Nurse Practitioner

## 2022-02-20 NOTE — Progress Notes (Unsigned)
Office Visit    Patient Name: [name] Date of Encounter: 02/21/2022  PCP:  Garwin Brothers, [facility]  Cardiologist:  Shelva Majestic, MD  Advanced Practice Provider:  No care team member to display Electrophysiologist:  Will Meredith Leeds, MD   HPI     Shellhammer is a 76 y.o. male with past medical history significant for CABG, CKD, NSVT presents today for follow-up appointment.  He was admitted 02/07/2022 to 02/11/2022 for further evaluation and treatment of NSTEMI.  Found to have severe AS with NSVT.  Followed by cardiology.  Prognosis was guarded and palliative care was consulted.  He underwent redo CABG.  NSVT in the setting of hypokalemia.  He was not a candidate for TAVR due to comorbidities, renal disease, and limited vascular access.  Diuretic changed to oral furosemide 80 mg twice daily at discharge.  He is in permanent atrial fibrillation not on anticoagulation due to previous GI bleed and anemia.  Today, he tells me that he has not had any further chest pain since starting Imdur in the hospital.  He is in permanent atrial fibrillation but not on a blood thinner due to bleeding in his stomach.  He does not tolerate amiodarone.  The Sunday before his hospitalization he felt out of sorts and was throwing up and nauseous.  He was diuresed in the hospital, felt much better, and was discharged.  He does feel like over the last 2 weeks that he has accumulated some fluid in his lower extremity.  He is always short of breath.  His shortness of breath started back in 2020 when he had his COVID infection.  Reports no chest pain, pressure, or tightness. No edema, orthopnea, PND. Reports no palpitations.   Past Medical History    Past Medical History:  Diagnosis Date   Anemia    Angina    Aortic stenosis    Atrial fibrillation ([place])    Blood transfusion    Bronchitis    Bulging discs    "8 of them; thoracic, lumbar, sacral area"   CHF  (congestive heart failure) (HCC)    Chronic back pain    Chronic kidney disease    "mild kidney disease"   Colon polyps    Coronary artery disease 01/24/11   Successful PCI long segmental stensois  vein graft to the CX marginal vessel-graft previously stented prox. 3.0x45m TAXUS stent mid seg 3.0x262mTAXUS stent now tandem stens of 3.0x3826m 3.0x8mm90maced with the seg. 50,80 & 99% stenosis reduced to 0%   DDD (degenerative disc disease)    Diastolic heart failure (HCC)[place] Dysrhythmia    "PAC's and PVC's"   Esophageal dilatation    "have had it done 7 times; last time ~ 2001"   Esophageal stricture    GERD (gastroesophageal reflux disease)    Headache(784.0)    Heart murmur    Hiatal hernia    Hyperlipidemia    Hypertension    [facility]   Myocardial infarction (HCC)[place]05   "had 3 heart attacks this year"   Paroxysmal atrial fibrillation (HCC)[place] Pneumonia 2020   Pyloric stenosis    RBBB    Renal artery stenosis (HCC)    hhistory of right renal artery stenting and re-intervention for "in-stent restenosis.   S/P CABG (coronary artery bypass graft) [place]    "  when I had the heart attacks"   Subclavian artery stenosis, left ([place])    Ulcer    "small; Dr. Henrene Pastor found it 02/2010"   Past Surgical History:  Procedure Laterality Date   ABDOMINAL AORTOGRAM W/LOWER EXTREMITY N/A 05/10/2019   Procedure: ABDOMINAL AORTOGRAM W/LOWER EXTREMITY;  Surgeon: Elam Dutch, MD;  Location: [place] [facility];  Service: Cardiovascular;  Laterality: N/A;   ADENOIDECTOMY     "when I was an infant"   APPENDECTOMY  2536   APPLICATION OF WOUND VAC Left 07/23/2019   Procedure: Application Of Wound Vac;  Surgeon: Angelia Mould, MD;  Location: [place];  Service: Vascular;  Laterality: Left;   BALLOON DILATION N/A 10/14/2018   Procedure: BALLOON DILATION;  Surgeon: Lavena Bullion, DO;  Location: [place];  Service: Gastroenterology;  Laterality: N/A;   BIOPSY  07/26/2018   Procedure: BIOPSY;  Surgeon: Thornton Park, MD;  Location: [place];  Service: Gastroenterology;;   BIOPSY  10/14/2018   Procedure: BIOPSY;  Surgeon: Lavena Bullion, DO;  Location: [place];  Service: Gastroenterology;;   CARDIOVERSION N/A 04/17/2017   Procedure: CARDIOVERSION;  Surgeon: Jerline Pain, MD;  Location: [place];  Service: Cardiovascular;  Laterality: N/A;   CAROTID ENDARTERECTOMY Right 1996   CE   COLONOSCOPY WITH PROPOFOL N/A 10/14/2018   Procedure: COLONOSCOPY WITH PROPOFOL;  Surgeon: Lavena Bullion, DO;  Location: [place];  Service: Gastroenterology;  Laterality: N/A;   CORONARY ANGIOPLASTY WITH STENT PLACEMENT  01/24/11   "today makes a total of 9 stents"   CORONARY ARTERY BYPASS GRAFT  1984   CABG X 4   CORONARY ARTERY BYPASS GRAFT  1996   CABG X 4   ENDARTERECTOMY FEMORAL Right 07/15/2019   Procedure: RIGHT FEMORAL ENDARTARECTOMY WITH PROFUNDOPLASTY;  Surgeon: Elam Dutch, MD;  Location: [place];  Service: Vascular;  Laterality: Right;   ESOPHAGOGASTRODUODENOSCOPY (EGD) WITH PROPOFOL N/A 07/26/2018   Procedure: ESOPHAGOGASTRODUODENOSCOPY (EGD) WITH PROPOFOL;  Surgeon: Thornton Park, MD;  Location: [place];  Service: Gastroenterology;  Laterality: N/A;   ESOPHAGOGASTRODUODENOSCOPY (EGD) WITH PROPOFOL N/A 10/14/2018   Procedure: ESOPHAGOGASTRODUODENOSCOPY (EGD) WITH PROPOFOL;  Surgeon: Lavena Bullion, DO;  Location: [place];  Service: Gastroenterology;  Laterality: N/A;   EYE SURGERY  2009   detached retina in right eye   FEMORAL-FEMORAL BYPASS GRAFT Bilateral 07/15/2019   Procedure: LEFT TO RIGHT FEMORAL-FEMORAL ARTERY;  Surgeon: Elam Dutch, MD;  [facility];  Service: Vascular;  Laterality: Bilateral;   FRACTURE SURGERY  07/03/2018   fractured right hip sx.   IR THORACENTESIS ASP PLEURAL SPACE W/IMG GUIDE  10/12/2018   LEFT HEART  CATHETERIZATION WITH CORONARY/GRAFT ANGIOGRAM N/A 01/24/2011   Procedure: LEFT HEART CATHETERIZATION WITH Beatrix Fetters;  Surgeon: Troy Sine, MD;  Location: [facility] C[facility] LAB;  Service: Cardiovascular;  Laterality: N/A;   PERIPHERAL VASCULAR INTERVENTION  07/16/2018   Procedure: PERIPHERAL VASCULAR INTERVENTION;  Surgeon: Elam Dutch, MD;  Location: [place] [facility];  Service: Cardiovascular;;  SMA and Celiac   pylondial cyst removal     RETINAL DETACHMENT SURGERY  04/2007   right   TONSILLECTOMY  1972   VISCERAL ANGIOGRAPHY N/A 07/16/2018   Procedure: VISCERAL ANGIOGRAPHY;  Surgeon: Elam Dutch, MD;  Location: [place] [facility];  Service: Cardiovascular;  Laterality: N/A;   WOUND DEBRIDEMENT Left 07/23/2019   Procedure: INCISION AND DRAINAGE LEFT GROIN WOUND;  Surgeon: Angelia Mould, MD;  Location: [place];  Service: Vascular;  Laterality: Left;    Allergies  Allergies  Allergen Reactions   Codeine Nausea And Vomiting   Sulfa Antibiotics Rash   Sulfonamide Derivatives Rash   Amiodarone     Developed tremor ultimately requiring discontinuation   Oxycodone Nausea Only     EKGs/Labs/Other Studies Reviewed:   The following studies were reviewed today:  Echocardiogram 10/19/21 IMPRESSIONS    1. Left ventricular ejection fraction, by estimation, is 60 to 65%. The left ventricle has normal function. The left ventricle has no regional wall motion abnormalities. There is mild left ventricular hypertrophy. Indeterminate diastolic filling due to E-A fusion.  2. Right ventricular systolic function is normal. The right ventricular size is normal. There is normal pulmonary artery systolic pressure.  3. Left atrial size was mild to moderately dilated.  4. The mitral valve is normal in structure. Mild mitral valve regurgitation. No evidence of mitral stenosis.  5. Visually the valve has moderate to severe stenosis. DI 0.23, mean gradient only 19 mmHg, but AVA  0.78 cm2 SVi 34.. The aortic valve is calcified. There is moderate calcification of the aortic valve. There is moderate thickening of the aortic valve. Aortic valve regurgitation is not visualized. Moderate to severe aortic valve stenosis. Aortic valve area, by VTI measures 0.78 cm. Aortic valve mean gradient measures 19.0 mmHg.  6. The inferior vena cava is normal in size with greater than 50% respiratory variability, suggesting right atrial pressure of 3 mmHg.  FINDINGS  Left Ventricle: Left ventricular ejection fraction, by estimation, is 60 to 65%. The left ventricle has normal function. The left ventricle has no regional wall motion abnormalities. The left ventricular internal cavity size was normal in size. There is  mild left ventricular hypertrophy. Indeterminate diastolic filling due to E-A fusion.  Right Ventricle: The right ventricular size is normal. No increase in right ventricular wall thickness. Right ventricular systolic function is normal. There is normal pulmonary artery systolic pressure. The tricuspid regurgitant velocity is 2.19 m/s, and  with an assumed right atrial pressure of 8 mmHg, the estimated right ventricular systolic pressure is 28.3 mmHg.  Left Atrium: Left atrial size was mild to moderately dilated.  Right Atrium: Right atrial size was normal in size.  Pericardium: There is no evidence of pericardial effusion.  Mitral Valve: The mitral valve is normal in structure. Mild mitral valve regurgitation. No evidence of mitral valve stenosis.  Tricuspid Valve: The tricuspid valve is normal in structure. Tricuspid valve regurgitation is mild . No evidence of tricuspid stenosis.  Aortic Valve: Visually the valve has moderate to severe stenosis. DI 0.23, mean gradient only 19 mmHg, but AVA 0.78 cm2 SVi 34. The aortic valve is calcified. There is moderate calcification of the aortic valve. There is moderate thickening of the aortic  valve. Aortic valve  regurgitation is not visualized. Aortic regurgitation PHT measures 325 msec. Moderate to severe aortic stenosis is present. Aortic valve mean gradient measures 19.0 mmHg. Aortic valve peak gradient measures 30.6 mmHg. Aortic valve area, by VTI measures 0.78 cm.  Pulmonic Valve: The pulmonic valve was normal in structure. Pulmonic valve regurgitation is not visualized. No evidence of pulmonic stenosis.  Aorta: The aortic root is normal in size and structure.  Venous: The inferior vena cava is normal in size with greater than 50% respiratory variability, suggesting right atrial pressure of 3 mmHg.  IAS/Shunts: No atrial level shunt detected by color flow Doppler.       EKG:  EKG is not ordered today.  Recent Labs: 02/07/2022: ALT 11; B Natriuretic Peptide 601.5 02/10/2022: Magnesium 2.3 02/11/2022: BUN 37; Creatinine, Ser 1.89; Hemoglobin 11.3; Platelets 147; Potassium 4.0; Sodium 135  Recent Lipid Panel    Component Value Date/Time   CHOL 132 01/29/2020 1010   TRIG 65 01/29/2020 1010   HDL 63 01/29/2020 1010   CHOLHDL 2.1 01/29/2020 1010   CHOLHDL 2.6 08/01/2014 0852   VLDL 18 08/01/2014 0852   LDLCALC 55 01/29/2020 1010    Risk Assessment/Calculations:   CHA2DS2-VASc Score = 4   This indicates a 4.8% annual risk of stroke. The patient's score is based upon: CHF History: 0 HTN History: 1 Diabetes History: 0 Stroke History: 0 Vascular Disease History: 1 Age Score: 2 Gender Score: 0     Home Medications   Current Meds  Medication Sig   acetaminophen (TYLENOL) 500 MG tablet Take 1,000 mg by mouth every 6 (six) hours as needed for moderate pain.   aspirin EC 81 MG tablet Take 1 tablet (81 mg total) by mouth daily.   butalbital-aspirin-caffeine (FIORINAL) 50-325-40 MG capsule Take 1 capsule by mouth daily as needed for headache.    diltiazem (CARDIZEM CD) 120 MG 24 hr capsule TAKE 1 CAPSULE BY MOUTH EVERY DAY   dutasteride (AVODART) 0.5 MG capsule Take 0.5 mg by  mouth daily.   esomeprazole (NEXIUM) 40 MG capsule TAKE 1 CAPSULE (40 MG TOTAL) BY MOUTH 2 (TWO) TIMES DAILY BEFORE A MEAL.   ezetimibe (ZETIA) 10 MG tablet TAKE 1 TABLET BY MOUTH EVERY DAY   fluticasone (FLONASE) 50 MCG/ACT nasal spray Place 1 spray into both nostrils daily.   Gabapentin Enacarbil (HORIZANT) 600 MG TBCR Take 600 mg by mouth daily as needed (restless leg). (1700)   isosorbide mononitrate (IMDUR) 30 MG 24 hr tablet Take 1 tablet (30 mg total) by mouth daily.   KLOR-CON M20 20 MEQ tablet Take 20 mEq by mouth once a week. On Wednesday's   loratadine (CLARITIN) 10 MG tablet Take 10 mg by mouth daily as needed for allergies, rhinitis or itching.    lovastatin (MEVACOR) 40 MG tablet TAKE 1 TABLET BY MOUTH EVERYDAY AT BEDTIME   metolazone (ZAROXOLYN) 2.5 MG tablet Take 2.5 mg by mouth once a week. On Wednesday's   metoprolol succinate (TOPROL-XL) 25 MG 24 hr tablet Take 1 tablet (25 mg total) by mouth 2 (two) times daily.   Multiple Vitamins-Minerals (MULTIVITAMINS THER. W/MINERALS) TABS Take 1 tablet by mouth every evening.   nitroGLYCERIN (NITROLINGUAL) 0.4 MG/SPRAY spray PLACE 1 SPRAY UNDER THE TONGUE EVERY 5 (FIVE) MINUTES X 3 DOSES AS NEEDED FOR CHEST PAIN.   nitroGLYCERIN (NITROSTAT) 0.4 MG SL tablet PLACE 1 TABLET UNDER THE TONGUE EVERY 5 MINUTES AS NEEDED. (Patient taking differently: Place 0.4 mg under the tongue every 5 (five) minutes as needed for chest pain.)   ranolazine (RANEXA) 500 MG 12 hr tablet TAKE 1 TABLET BY MOUTH TWICE A DAY   senna-docusate (SENOKOT-S) 8.6-50 MG tablet Take 1 tablet by mouth daily.   sucralfate (CARAFATE) 1 GM/10ML suspension Take 10 mLs (1 g total) by mouth 4 (four) times daily -  with meals and at bedtime.   torsemide (DEMADEX) 20 MG tablet Take 80 mg by mouth 2 (two) times daily.   vitamin E 200 UNIT capsule Take 200 Units by mouth every evening.     Review of Systems      All other systems reviewed and are otherwise negative except as noted  above.  Physical Exam  VS:  BP 110/86 (BP Location: Right Arm, Patient Position: Sitting, Cuff Size: Normal)   Pulse 68   Ht '5\' 5"'$  (1.651 m)   Wt 128 lb (58.1 kg)   SpO2 96%   BMI 21.30 kg/m  , BMI Body mass index is 21.3 kg/m.  Wt Readings from Last 3 Encounters:  02/21/22 128 lb (58.1 kg)  02/11/22 125 lb 14.1 oz (57.1 kg)  01/31/22 130 lb 0.6 oz (59 kg)     GEN: Well nourished, well developed, in no acute distress. HEENT: normal. Neck: Supple, no JVD, carotid bruits, or masses. Cardiac: irregularly irregular, 5/6 midsystolic harsh murmur radiates to carotids, rubs, or gallops. No clubbing, cyanosis, edema.  Radials/PT 2+ and equal bilaterally.  Respiratory:  Respirations regular and unlabored, clear to auscultation bilaterally. GI: Soft, nontender, nondistended. MS: No deformity or atrophy. Skin: Warm and dry, no rash. Neuro:  Strength and sensation are intact. Psych: Normal affect.  Assessment & Plan    CAD status post CABG ([place]) -no further chest pain since his increase in Imdur -Continue GDMT: Aspirin 81 mg daily, Cardizem 120 mg daily, Zetia 10 mg daily, Imdur 30 mg daily, metolazone 2.5 mg once a week, metoprolol succinate 25 mg twice daily, nitro as needed, Ranexa 500 mg twice a day, Demadex 40 mg twice a day (increased to 60 mg in the morning for 3 days) -close follow-up arranged  NSVT/permanent Afib -Not on a blood thinner due to GI bleed -Continue Cardizem 120 mg daily as well as metoprolol succinate 25 mg twice daily -Rate controlled atrial fibrillation today -Patient is asymptomatic  Severe AS -AVA 0.8 on recent echocardiogram, not a TAVR candidate -Continue medical management -Will plan annual echocardiograms unless symptoms progress  Lower ext edema -We will plan to increase his Demadex for 3 days and get a BMP next week -Somewhat limited due to his renal disease -Would recommend following up with a nephrologist  PAD status post left to right  femorofemoral bypass grafting 07/15/2019 -He has follow-up with vascular in February -They are following him with serial carotid artery ultrasounds  Hyperlipidemia -Last LDL 68 which is at goal -Would recommend follow-up lipid panel at his next appointment  CKD stage 3B -1.8 on recent labs -will get follow-up BMP next week after diuretic increase        Disposition: Follow up 2-3 months with Shelva Majestic, MD or APP.  Signed, Elgie Collard, PA-C 02/21/2022, 10:41 AM [place]

## 2022-02-21 ENCOUNTER — Encounter: Payer: Self-pay | Admitting: Physician Assistant

## 2022-02-21 ENCOUNTER — Ambulatory Visit: Payer: Medicare PPO | Attending: Physician Assistant | Admitting: Physician Assistant

## 2022-02-21 ENCOUNTER — Ambulatory Visit: Payer: Medicare PPO | Admitting: Physician Assistant

## 2022-02-21 VITALS — BP 110/86 | HR 68 | Ht 65.0 in | Wt 128.0 lb

## 2022-02-21 DIAGNOSIS — E876 Hypokalemia: Secondary | ICD-10-CM | POA: Diagnosis not present

## 2022-02-21 DIAGNOSIS — I4821 Permanent atrial fibrillation: Secondary | ICD-10-CM

## 2022-02-21 DIAGNOSIS — Z951 Presence of aortocoronary bypass graft: Secondary | ICD-10-CM

## 2022-02-21 DIAGNOSIS — N184 Chronic kidney disease, stage 4 (severe): Secondary | ICD-10-CM

## 2022-02-21 DIAGNOSIS — E785 Hyperlipidemia, unspecified: Secondary | ICD-10-CM | POA: Diagnosis not present

## 2022-02-21 DIAGNOSIS — I25709 Atherosclerosis of coronary artery bypass graft(s), unspecified, with unspecified angina pectoris: Secondary | ICD-10-CM | POA: Diagnosis not present

## 2022-02-21 DIAGNOSIS — I739 Peripheral vascular disease, unspecified: Secondary | ICD-10-CM | POA: Diagnosis not present

## 2022-02-21 NOTE — Patient Instructions (Signed)
Medication Instructions:  1.Take an extra 20 mg of torsemide (Demadex) in the morning for the next 3 days, then resume 40 mg in the morning and and 20 mg in the evening *If you need a refill on your cardiac medications before your next appointment, please call your pharmacy*   Lab Work: BMP in 1 week If you have labs (blood work) drawn today and your tests are completely normal, you will receive your results only by: [place] (if you have MyChart) OR A paper copy in the mail If you have any lab test that is abnormal or we need to change your treatment, we will call you to review the results.   Follow-Up: At [facility], you and your health needs are our priority.  As part of our continuing mission to provide you with exceptional heart care, we have created designated Provider Care Teams.  These Care Teams include your primary Cardiologist (physician) and Advanced Practice Providers (APPs -  Physician Assistants and Nurse Practitioners) who all work together to provide you with the care you need, when you need it.  Your next appointment:   2-3 month(s)  The format for your next appointment:   In Person  Provider:   Shelva Majestic, MD    Other Instructions 1.Wear compression stockings 2.Weigh yourself daily and let us know if you have a weight gain of 2-3 pounds or more overnight or 5 pounds or more in a week  Important Information About Sugar

## 2022-02-25 ENCOUNTER — Inpatient Hospital Stay: Payer: Medicare PPO | Admitting: Internal Medicine

## 2022-03-02 ENCOUNTER — Other Ambulatory Visit: Payer: Self-pay

## 2022-03-02 ENCOUNTER — Encounter: Payer: Self-pay | Admitting: Internal Medicine

## 2022-03-02 ENCOUNTER — Ambulatory Visit: Payer: Medicare PPO | Admitting: Internal Medicine

## 2022-03-02 VITALS — BP 112/64 | HR 70 | Temp 97.8°F | Resp 18 | Ht 65.0 in | Wt 131.6 lb

## 2022-03-02 DIAGNOSIS — I350 Nonrheumatic aortic (valve) stenosis: Secondary | ICD-10-CM | POA: Diagnosis not present

## 2022-03-02 DIAGNOSIS — I214 Non-ST elevation (NSTEMI) myocardial infarction: Secondary | ICD-10-CM | POA: Diagnosis not present

## 2022-03-02 DIAGNOSIS — D631 Anemia in chronic kidney disease: Secondary | ICD-10-CM | POA: Diagnosis not present

## 2022-03-02 DIAGNOSIS — Z951 Presence of aortocoronary bypass graft: Secondary | ICD-10-CM

## 2022-03-02 DIAGNOSIS — N1832 Chronic kidney disease, stage 3b: Secondary | ICD-10-CM | POA: Diagnosis not present

## 2022-03-02 DIAGNOSIS — I4821 Permanent atrial fibrillation: Secondary | ICD-10-CM

## 2022-03-02 DIAGNOSIS — I739 Peripheral vascular disease, unspecified: Secondary | ICD-10-CM

## 2022-03-02 MED ORDER — BUTALBITAL-ASPIRIN-CAFFEINE 50-325-40 MG PO CAPS: 1.0000 | ORAL_CAPSULE | ORAL | 0 refills | Status: DC | PRN

## 2022-03-02 NOTE — Assessment & Plan Note (Signed)
Will repeat renal function today. He takes metolazone once a week, and torsemide 80 mg twice a day.

## 2022-03-02 NOTE — Assessment & Plan Note (Signed)
Not a candidate for TAVR. He was recommended hospice vs palliative care. He will think over it.

## 2022-03-02 NOTE — Assessment & Plan Note (Signed)
S/p left to right femoral popliteal by pass.

## 2022-03-02 NOTE — Assessment & Plan Note (Signed)
Recent NSTEMI for medical management only.

## 2022-03-02 NOTE — Progress Notes (Addendum)
Established Patient Office Visit  Subjective   Patient ID: [name], male    DOB: 25-Sep-1945  Age: 76 y.o. MRN: [id number]  Chief Complaint  Patient presents with   Hospitalization Follow-up    Needs BMP checked per cardiology    HPI 76 years old male with history of coronary artery disease, status postcoronary artery bypass graft, Permanent atrial fibrillation who was admitted to hospital beginning of December with increased shortness of breath and increased swelling of his legs.  He was noted to have a severe aortic stenosis but he was considered not a candidate part for TAVR because of his kidney function and severe peripheral artery disease.  He was given diuretic to reduce swelling of his leg.  His echo shows ejection fraction of 60 to 65%.  He was advised comfort care option.  I have discussed with him the difference between hospice care and palliative care.  He understood that and he will think about it.  His wife accompanied him.  He says that since he was discharged home he noticed that his swelling in his leg is getting worse.  He did take metolazone 2.5 mg once a week and torsemide 80 mg twice a day.  He is feeling weak but denies any chest pain.  I have reviewed his medication with him.  Review of Systems  Constitutional:  Positive for malaise/fatigue.  Respiratory:  Positive for shortness of breath.   Gastrointestinal: Negative.   Neurological:  Positive for weakness and headaches.  Psychiatric/Behavioral:  Negative for depression and suicidal ideas.       Objective:     BP 112/64 (BP Location: Right Arm, Patient Position: Sitting, Cuff Size: Normal)   Pulse 70   Temp 97.8 F (36.6 C) (Temporal)   Resp 18   Ht '5\' 5"'$  (1.651 m)   Wt 131 lb 9.6 oz (59.7 kg)   SpO2 97%   BMI 21.90 kg/m    Physical Exam Constitutional:      Appearance: He is ill-appearing.  HENT:     Head: Normocephalic and atraumatic.  Eyes:     Extraocular Movements: Extraocular  movements intact.     Pupils: Pupils are equal, round, and reactive to light.  Cardiovascular:     Rate and Rhythm: Normal rate. Rhythm irregular.     Heart sounds: Murmur heard.  Pulmonary:     Effort: Pulmonary effort is normal.     Breath sounds: Normal breath sounds.  Abdominal:     General: Bowel sounds are normal.     Palpations: Abdomen is soft.  Musculoskeletal:        General: Swelling present.  Neurological:     General: No focal deficit present.     Mental Status: He is alert and oriented to person, place, and time.      No results found for any visits on 03/02/22.    The ASCVD Risk score (Arnett DK, et al., 2019) failed to calculate for the following reasons:   The patient has a prior MI or stroke diagnosis    Assessment & Plan:   Problem List Items Addressed This Visit       Cardiovascular and Mediastinum   PVD (peripheral vascular disease) ([place]) - Primary (Chronic)   Atrial fibrillation (HCC) (Chronic)    Rate is controlled, he is not on blood thinner.      NSTEMI (non-ST elevated myocardial infarction) [facility])    Recent NSTEMI for medical management only.  PAD (peripheral artery disease) (HCC)    S/p left to right femoral popliteal by pass.      Severe aortic stenosis    Not a candidate for TAVR. He was recommended hospice vs palliative care. He will think over it.        Genitourinary   Anemia in chronic kidney disease (Chronic)   Stage 3b chronic kidney disease ([place])    Will repeat renal function today. He takes metolazone once a week, and torsemide 80 mg twice a day.         Other   Hx of CABG '84, '96, last PCI 2012 (Chronic)    Return in about 1 month (around 04/02/2022).    Garwin Brothers, MD

## 2022-03-02 NOTE — Assessment & Plan Note (Signed)
Rate is controlled, he is not on blood thinner.

## 2022-03-03 ENCOUNTER — Other Ambulatory Visit: Payer: Self-pay | Admitting: Internal Medicine

## 2022-03-03 DIAGNOSIS — R600 Localized edema: Secondary | ICD-10-CM | POA: Diagnosis not present

## 2022-03-03 DIAGNOSIS — Z9862 Peripheral vascular angioplasty status: Secondary | ICD-10-CM | POA: Diagnosis not present

## 2022-03-03 DIAGNOSIS — I739 Peripheral vascular disease, unspecified: Secondary | ICD-10-CM | POA: Diagnosis not present

## 2022-03-03 DIAGNOSIS — L97511 Non-pressure chronic ulcer of other part of right foot limited to breakdown of skin: Secondary | ICD-10-CM | POA: Diagnosis not present

## 2022-03-03 DIAGNOSIS — B351 Tinea unguium: Secondary | ICD-10-CM | POA: Diagnosis not present

## 2022-03-03 LAB — CMP14 + ANION GAP
ALT: 6 IU/L (ref 0–44)
AST: 15 IU/L (ref 0–40)
Albumin/Globulin Ratio: 1.6 (ref 1.2–2.2)
Albumin: 4 g/dL (ref 3.8–4.8)
Alkaline Phosphatase: 98 IU/L (ref 44–121)
Anion Gap: 18 mmol/L (ref 10.0–18.0)
BUN/Creatinine Ratio: 18 (ref 10–24)
BUN: 42 mg/dL — ABNORMAL HIGH (ref 8–27)
Bilirubin Total: <0.2 mg/dL (ref 0.0–1.2)
CO2: 23 mmol/L (ref 20–29)
Calcium: 8.7 mg/dL (ref 8.6–10.2)
Chloride: 102 mmol/L (ref 96–106)
Creatinine, Ser: 2.31 mg/dL — ABNORMAL HIGH (ref 0.76–1.27)
Globulin, Total: 2.5 g/dL (ref 1.5–4.5)
Glucose: 103 mg/dL — ABNORMAL HIGH (ref 70–99)
Potassium: 4.2 mmol/L (ref 3.5–5.2)
Sodium: 143 mmol/L (ref 134–144)
Total Protein: 6.5 g/dL (ref 6.0–8.5)
eGFR: 28 mL/min/{1.73_m2} — ABNORMAL LOW (ref >59)

## 2022-03-03 MED ORDER — BUTALBITAL-ASPIRIN-CAFFEINE 50-325-40 MG PO CAPS: 1.0000 | ORAL_CAPSULE | ORAL | 2 refills | Status: AC | PRN

## 2022-03-04 ENCOUNTER — Telehealth: Payer: Self-pay | Admitting: Internal Medicine

## 2022-03-04 NOTE — Telephone Encounter (Signed)
The pharmacy said butalbital-asprin is not available, they need butalbital-acetaminophen-caffeine sent in instead

## 2022-03-09 ENCOUNTER — Other Ambulatory Visit: Payer: Self-pay

## 2022-03-09 MED ORDER — HORIZANT 600 MG PO TBCR: 600.0000 mg | EXTENDED_RELEASE_TABLET | Freq: Every day | ORAL | 3 refills | Status: AC

## 2022-03-14 ENCOUNTER — Other Ambulatory Visit: Payer: Self-pay

## 2022-03-14 ENCOUNTER — Other Ambulatory Visit: Payer: Self-pay | Admitting: Internal Medicine

## 2022-03-14 MED ORDER — BUTALBITAL-APAP-CAFFEINE 50-325-40 MG PO TABS: 1.0000 | ORAL_TABLET | Freq: Four times a day (QID) | ORAL | 2 refills | Status: AC | PRN

## 2022-03-15 ENCOUNTER — Ambulatory Visit: Payer: Medicare PPO | Admitting: Nurse Practitioner

## 2022-03-31 ENCOUNTER — Telehealth: Payer: Self-pay | Admitting: Cardiovascular Disease

## 2022-03-31 ENCOUNTER — Emergency Department (HOSPITAL_COMMUNITY): Payer: Medicare PPO

## 2022-03-31 ENCOUNTER — Encounter (HOSPITAL_COMMUNITY): Payer: Self-pay

## 2022-03-31 ENCOUNTER — Other Ambulatory Visit: Payer: Self-pay

## 2022-03-31 DIAGNOSIS — A419 Sepsis, unspecified organism: Secondary | ICD-10-CM

## 2022-03-31 DIAGNOSIS — R079 Chest pain, unspecified: Secondary | ICD-10-CM | POA: Diagnosis not present

## 2022-03-31 DIAGNOSIS — Z951 Presence of aortocoronary bypass graft: Secondary | ICD-10-CM | POA: Diagnosis not present

## 2022-03-31 DIAGNOSIS — E1122 Type 2 diabetes mellitus with diabetic chronic kidney disease: Secondary | ICD-10-CM | POA: Diagnosis present

## 2022-03-31 DIAGNOSIS — I5023 Acute on chronic systolic (congestive) heart failure: Secondary | ICD-10-CM | POA: Diagnosis not present

## 2022-03-31 DIAGNOSIS — I472 Ventricular tachycardia, unspecified: Secondary | ICD-10-CM | POA: Diagnosis not present

## 2022-03-31 DIAGNOSIS — Z823 Family history of stroke: Secondary | ICD-10-CM

## 2022-03-31 DIAGNOSIS — I25110 Atherosclerotic heart disease of native coronary artery with unstable angina pectoris: Secondary | ICD-10-CM | POA: Diagnosis present

## 2022-03-31 DIAGNOSIS — Z95820 Peripheral vascular angioplasty status with implants and grafts: Secondary | ICD-10-CM

## 2022-03-31 DIAGNOSIS — R570 Cardiogenic shock: Secondary | ICD-10-CM | POA: Diagnosis not present

## 2022-03-31 DIAGNOSIS — Z1152 Encounter for screening for COVID-19: Secondary | ICD-10-CM | POA: Diagnosis not present

## 2022-03-31 DIAGNOSIS — Z66 Do not resuscitate: Secondary | ICD-10-CM | POA: Diagnosis not present

## 2022-03-31 DIAGNOSIS — I4821 Permanent atrial fibrillation: Secondary | ICD-10-CM | POA: Diagnosis present

## 2022-03-31 DIAGNOSIS — I080 Rheumatic disorders of both mitral and aortic valves: Secondary | ICD-10-CM | POA: Diagnosis present

## 2022-03-31 DIAGNOSIS — R5383 Other fatigue: Secondary | ICD-10-CM | POA: Diagnosis present

## 2022-03-31 DIAGNOSIS — E785 Hyperlipidemia, unspecified: Secondary | ICD-10-CM | POA: Diagnosis present

## 2022-03-31 DIAGNOSIS — I5084 End stage heart failure: Secondary | ICD-10-CM | POA: Diagnosis present

## 2022-03-31 DIAGNOSIS — R0602 Shortness of breath: Secondary | ICD-10-CM | POA: Diagnosis not present

## 2022-03-31 DIAGNOSIS — I4891 Unspecified atrial fibrillation: Secondary | ICD-10-CM | POA: Diagnosis not present

## 2022-03-31 DIAGNOSIS — Z803 Family history of malignant neoplasm of breast: Secondary | ICD-10-CM

## 2022-03-31 DIAGNOSIS — R6521 Severe sepsis with septic shock: Secondary | ICD-10-CM | POA: Diagnosis not present

## 2022-03-31 DIAGNOSIS — N1832 Chronic kidney disease, stage 3b: Secondary | ICD-10-CM | POA: Diagnosis present

## 2022-03-31 DIAGNOSIS — Z7982 Long term (current) use of aspirin: Secondary | ICD-10-CM

## 2022-03-31 DIAGNOSIS — J9621 Acute and chronic respiratory failure with hypoxia: Secondary | ICD-10-CM | POA: Diagnosis present

## 2022-03-31 DIAGNOSIS — Z7189 Other specified counseling: Secondary | ICD-10-CM

## 2022-03-31 DIAGNOSIS — I350 Nonrheumatic aortic (valve) stenosis: Secondary | ICD-10-CM | POA: Diagnosis not present

## 2022-03-31 DIAGNOSIS — R0789 Other chest pain: Secondary | ICD-10-CM | POA: Diagnosis not present

## 2022-03-31 DIAGNOSIS — Z833 Family history of diabetes mellitus: Secondary | ICD-10-CM

## 2022-03-31 DIAGNOSIS — I5082 Biventricular heart failure: Secondary | ICD-10-CM | POA: Diagnosis present

## 2022-03-31 DIAGNOSIS — Z515 Encounter for palliative care: Secondary | ICD-10-CM

## 2022-03-31 DIAGNOSIS — L97919 Non-pressure chronic ulcer of unspecified part of right lower leg with unspecified severity: Secondary | ICD-10-CM | POA: Diagnosis present

## 2022-03-31 DIAGNOSIS — M549 Dorsalgia, unspecified: Secondary | ICD-10-CM | POA: Diagnosis present

## 2022-03-31 DIAGNOSIS — I249 Acute ischemic heart disease, unspecified: Secondary | ICD-10-CM

## 2022-03-31 DIAGNOSIS — I739 Peripheral vascular disease, unspecified: Secondary | ICD-10-CM | POA: Diagnosis present

## 2022-03-31 DIAGNOSIS — Z885 Allergy status to narcotic agent status: Secondary | ICD-10-CM

## 2022-03-31 DIAGNOSIS — J90 Pleural effusion, not elsewhere classified: Secondary | ICD-10-CM | POA: Diagnosis not present

## 2022-03-31 DIAGNOSIS — K219 Gastro-esophageal reflux disease without esophagitis: Secondary | ICD-10-CM | POA: Diagnosis present

## 2022-03-31 DIAGNOSIS — D649 Anemia, unspecified: Secondary | ICD-10-CM | POA: Diagnosis present

## 2022-03-31 DIAGNOSIS — R64 Cachexia: Secondary | ICD-10-CM | POA: Diagnosis present

## 2022-03-31 DIAGNOSIS — I214 Non-ST elevation (NSTEMI) myocardial infarction: Secondary | ICD-10-CM | POA: Diagnosis present

## 2022-03-31 DIAGNOSIS — I130 Hypertensive heart and chronic kidney disease with heart failure and stage 1 through stage 4 chronic kidney disease, or unspecified chronic kidney disease: Secondary | ICD-10-CM | POA: Diagnosis present

## 2022-03-31 DIAGNOSIS — I499 Cardiac arrhythmia, unspecified: Secondary | ICD-10-CM | POA: Diagnosis not present

## 2022-03-31 DIAGNOSIS — R4689 Other symptoms and signs involving appearance and behavior: Secondary | ICD-10-CM | POA: Diagnosis present

## 2022-03-31 DIAGNOSIS — Z87891 Personal history of nicotine dependence: Secondary | ICD-10-CM | POA: Diagnosis not present

## 2022-03-31 DIAGNOSIS — Z79899 Other long term (current) drug therapy: Secondary | ICD-10-CM

## 2022-03-31 DIAGNOSIS — I252 Old myocardial infarction: Secondary | ICD-10-CM

## 2022-03-31 DIAGNOSIS — Z882 Allergy status to sulfonamides status: Secondary | ICD-10-CM

## 2022-03-31 DIAGNOSIS — I959 Hypotension, unspecified: Secondary | ICD-10-CM | POA: Diagnosis present

## 2022-03-31 DIAGNOSIS — D72829 Elevated white blood cell count, unspecified: Secondary | ICD-10-CM | POA: Diagnosis present

## 2022-03-31 DIAGNOSIS — E872 Acidosis, unspecified: Secondary | ICD-10-CM | POA: Diagnosis present

## 2022-03-31 DIAGNOSIS — I4510 Unspecified right bundle-branch block: Secondary | ICD-10-CM | POA: Diagnosis present

## 2022-03-31 DIAGNOSIS — I50813 Acute on chronic right heart failure: Secondary | ICD-10-CM

## 2022-03-31 DIAGNOSIS — G8929 Other chronic pain: Secondary | ICD-10-CM | POA: Diagnosis present

## 2022-03-31 DIAGNOSIS — Z955 Presence of coronary angioplasty implant and graft: Secondary | ICD-10-CM

## 2022-03-31 DIAGNOSIS — Z6821 Body mass index (BMI) 21.0-21.9, adult: Secondary | ICD-10-CM

## 2022-03-31 DIAGNOSIS — Z8249 Family history of ischemic heart disease and other diseases of the circulatory system: Secondary | ICD-10-CM

## 2022-03-31 DIAGNOSIS — Z888 Allergy status to other drugs, medicaments and biological substances status: Secondary | ICD-10-CM

## 2022-03-31 LAB — POCT I-STAT 7, (LYTES, BLD GAS, ICA,H+H)
Acid-base deficit: 10 mmol/L — ABNORMAL HIGH (ref 0.0–2.0)
Bicarbonate: 14.2 mmol/L — ABNORMAL LOW (ref 20.0–28.0)
Calcium, Ion: 1.08 mmol/L — ABNORMAL LOW (ref 1.15–1.40)
HCT: 35 % — ABNORMAL LOW (ref 39.0–52.0)
Hemoglobin: 11.9 g/dL — ABNORMAL LOW (ref 13.0–17.0)
O2 Saturation: 94 %
Patient temperature: 97.9
Potassium: 4.5 mmol/L (ref 3.5–5.1)
Sodium: 135 mmol/L (ref 135–145)
TCO2: 15 mmol/L — ABNORMAL LOW (ref 22–32)
pCO2 arterial: 24.6 mmHg — ABNORMAL LOW (ref 32–48)
pH, Arterial: 7.368 (ref 7.35–7.45)
pO2, Arterial: 69 mmHg — ABNORMAL LOW (ref 83–108)

## 2022-03-31 LAB — COMPREHENSIVE METABOLIC PANEL
ALT: 14 U/L (ref 0–44)
AST: 59 U/L — ABNORMAL HIGH (ref 15–41)
Albumin: 3.3 g/dL — ABNORMAL LOW (ref 3.5–5.0)
Alkaline Phosphatase: 83 U/L (ref 38–126)
Anion gap: 20 — ABNORMAL HIGH (ref 5–15)
BUN: 35 mg/dL — ABNORMAL HIGH (ref 8–23)
CO2: 19 mmol/L — ABNORMAL LOW (ref 22–32)
Calcium: 8.8 mg/dL — ABNORMAL LOW (ref 8.9–10.3)
Chloride: 97 mmol/L — ABNORMAL LOW (ref 98–111)
Creatinine, Ser: 2.36 mg/dL — ABNORMAL HIGH (ref 0.61–1.24)
GFR, Estimated: 27 mL/min — ABNORMAL LOW (ref >60)
Glucose, Bld: 224 mg/dL — ABNORMAL HIGH (ref 70–99)
Potassium: 3.6 mmol/L (ref 3.5–5.1)
Sodium: 136 mmol/L (ref 135–145)
Total Bilirubin: 0.8 mg/dL (ref 0.3–1.2)
Total Protein: 6.9 g/dL (ref 6.5–8.1)

## 2022-03-31 LAB — I-STAT CHEM 8, ED
BUN: 33 mg/dL — ABNORMAL HIGH (ref 8–23)
Calcium, Ion: 1.01 mmol/L — ABNORMAL LOW (ref 1.15–1.40)
Chloride: 100 mmol/L (ref 98–111)
Creatinine, Ser: 2.3 mg/dL — ABNORMAL HIGH (ref 0.61–1.24)
Glucose, Bld: 211 mg/dL — ABNORMAL HIGH (ref 70–99)
HCT: 38 % — ABNORMAL LOW (ref 39.0–52.0)
Hemoglobin: 12.9 g/dL — ABNORMAL LOW (ref 13.0–17.0)
Potassium: 3.6 mmol/L (ref 3.5–5.1)
Sodium: 137 mmol/L (ref 135–145)
TCO2: 20 mmol/L — ABNORMAL LOW (ref 22–32)

## 2022-03-31 LAB — LACTIC ACID, PLASMA
Lactic Acid, Venous: 5 mmol/L (ref 0.5–1.9)
Lactic Acid, Venous: 2.3 mmol/L (ref 0.5–1.9)

## 2022-03-31 LAB — CBC WITH DIFFERENTIAL/PLATELET
Abs Immature Granulocytes: 0.07 10*3/uL (ref 0.00–0.07)
Basophils Absolute: 0 10*3/uL (ref 0.0–0.1)
Basophils Relative: 0 %
Eosinophils Absolute: 0 10*3/uL (ref 0.0–0.5)
Eosinophils Relative: 0 %
HCT: 37.7 % — ABNORMAL LOW (ref 39.0–52.0)
Hemoglobin: 12.8 g/dL — ABNORMAL LOW (ref 13.0–17.0)
Immature Granulocytes: 1 %
Lymphocytes Relative: 5 %
Lymphs Abs: 0.7 10*3/uL (ref 0.7–4.0)
MCH: 34.7 pg — ABNORMAL HIGH (ref 26.0–34.0)
MCHC: 34 g/dL (ref 30.0–36.0)
MCV: 102.2 fL — ABNORMAL HIGH (ref 80.0–100.0)
Monocytes Absolute: 0.9 10*3/uL (ref 0.1–1.0)
Monocytes Relative: 6 %
Neutro Abs: 13.1 10*3/uL — ABNORMAL HIGH (ref 1.7–7.7)
Neutrophils Relative %: 88 %
Platelets: 192 10*3/uL (ref 150–400)
RBC: 3.69 MIL/uL — ABNORMAL LOW (ref 4.22–5.81)
RDW: 14 % (ref 11.5–15.5)
WBC: 14.7 10*3/uL — ABNORMAL HIGH (ref 4.0–10.5)
nRBC: 0 % (ref 0.0–0.2)

## 2022-03-31 LAB — I-STAT ARTERIAL BLOOD GAS, ED
Acid-base deficit: 6 mmol/L — ABNORMAL HIGH (ref 0.0–2.0)
Bicarbonate: 17.2 mmol/L — ABNORMAL LOW (ref 20.0–28.0)
Calcium, Ion: 1.1 mmol/L — ABNORMAL LOW (ref 1.15–1.40)
HCT: 35 % — ABNORMAL LOW (ref 39.0–52.0)
Hemoglobin: 11.9 g/dL — ABNORMAL LOW (ref 13.0–17.0)
O2 Saturation: 100 %
Patient temperature: 99
Potassium: 3.9 mmol/L (ref 3.5–5.1)
Sodium: 135 mmol/L (ref 135–145)
TCO2: 18 mmol/L — ABNORMAL LOW (ref 22–32)
pCO2 arterial: 26.3 mmHg — ABNORMAL LOW (ref 32–48)
pH, Arterial: 7.425 (ref 7.35–7.45)
pO2, Arterial: 240 mmHg — ABNORMAL HIGH (ref 83–108)

## 2022-03-31 LAB — RESP PANEL BY RT-PCR (RSV, FLU A&B, COVID)  RVPGX2
Influenza A by PCR: NEGATIVE
Influenza B by PCR: NEGATIVE
Resp Syncytial Virus by PCR: NEGATIVE
SARS Coronavirus 2 by RT PCR: NEGATIVE

## 2022-03-31 LAB — TROPONIN I (HIGH SENSITIVITY)
Troponin I (High Sensitivity): 10290 ng/L (ref <18)
Troponin I (High Sensitivity): >24000 ng/L (ref <18)

## 2022-03-31 LAB — BRAIN NATRIURETIC PEPTIDE: B Natriuretic Peptide: 916.6 pg/mL — ABNORMAL HIGH (ref 0.0–100.0)

## 2022-03-31 LAB — MAGNESIUM: Magnesium: 2.1 mg/dL (ref 1.7–2.4)

## 2022-03-31 LAB — MRSA NEXT GEN BY PCR, NASAL: MRSA by PCR Next Gen: NOT DETECTED

## 2022-03-31 LAB — PROTIME-INR
INR: 1.2 (ref 0.8–1.2)
Prothrombin Time: 15.1 seconds (ref 11.4–15.2)

## 2022-03-31 MED ORDER — DOCUSATE SODIUM 100 MG PO CAPS: 100.0000 mg | ORAL_CAPSULE | Freq: Two times a day (BID) | ORAL | Status: DC | PRN

## 2022-03-31 MED ORDER — HEPARIN SODIUM (PORCINE) 5000 UNIT/ML IJ SOLN
5000.0000 [IU] | Freq: Three times a day (TID) | INTRAMUSCULAR | Status: DC
  Administered 2022-03-31: 5000 [IU] via SUBCUTANEOUS
  Filled 2022-03-31: qty 1

## 2022-03-31 MED ORDER — VANCOMYCIN HCL IN DEXTROSE 1-5 GM/200ML-% IV SOLN: 1000.0000 mg | INTRAVENOUS | Status: DC

## 2022-03-31 MED ORDER — VANCOMYCIN HCL 1250 MG/250ML IV SOLN
1250.0000 mg | Freq: Once | INTRAVENOUS | Status: AC
  Administered 2022-03-31: 1250 mg via INTRAVENOUS
  Filled 2022-03-31: qty 250

## 2022-03-31 MED ORDER — POTASSIUM CHLORIDE 10 MEQ/100ML IV SOLN
10.0000 meq | INTRAVENOUS | Status: AC
  Administered 2022-03-31 (×4): 10 meq via INTRAVENOUS
  Filled 2022-03-31 (×4): qty 100

## 2022-03-31 MED ORDER — FUROSEMIDE 10 MG/ML IJ SOLN
80.0000 mg | Freq: Once | INTRAMUSCULAR | Status: AC
  Administered 2022-03-31: 80 mg via INTRAVENOUS
  Filled 2022-03-31: qty 8

## 2022-03-31 MED ORDER — SODIUM BICARBONATE 8.4 % IV SOLN
50.0000 meq | Freq: Once | INTRAVENOUS | Status: AC
  Administered 2022-03-31: 50 meq via INTRAVENOUS
  Filled 2022-03-31: qty 50

## 2022-03-31 MED ORDER — OXYCODONE HCL 5 MG PO TABS
5.0000 mg | ORAL_TABLET | Freq: Four times a day (QID) | ORAL | Status: DC | PRN
  Administered 2022-03-31: 5 mg via ORAL
  Filled 2022-03-31: qty 1

## 2022-03-31 MED ORDER — NOREPINEPHRINE 4 MG/250ML-% IV SOLN: 2.0000 ug/min | INTRAVENOUS | Status: DC

## 2022-03-31 MED ORDER — SODIUM BICARBONATE 8.4 % IV SOLN
INTRAVENOUS | Status: DC
  Filled 2022-03-31: qty 1000

## 2022-03-31 MED ORDER — SODIUM CHLORIDE 0.9 % IV SOLN
2.0000 g | INTRAVENOUS | Status: DC
  Administered 2022-03-31: 2 g via INTRAVENOUS
  Filled 2022-03-31: qty 12.5

## 2022-03-31 MED ORDER — POLYETHYLENE GLYCOL 3350 17 G PO PACK: 17.0000 g | PACK | Freq: Every day | ORAL | Status: DC | PRN

## 2022-03-31 MED ORDER — MORPHINE SULFATE (PF) 2 MG/ML IV SOLN
1.0000 mg | INTRAVENOUS | Status: DC | PRN
  Administered 2022-03-31 – 2022-04-01 (×2): 1 mg via INTRAVENOUS
  Filled 2022-03-31 (×2): qty 1

## 2022-03-31 MED ORDER — CHLORHEXIDINE GLUCONATE CLOTH 2 % EX PADS
6.0000 | MEDICATED_PAD | Freq: Every day | CUTANEOUS | Status: DC
  Administered 2022-03-31: 6 via TOPICAL

## 2022-03-31 MED ORDER — SODIUM CHLORIDE 0.9 % IV SOLN: 250.0000 mL | INTRAVENOUS | Status: DC

## 2022-03-31 NOTE — Progress Notes (Signed)
[facility] Progress Note Patient Name: [name] DOB: 07/08/1945 MRN: [id number]   Date of Service  04/03/2022  HPI/Events of Note  ABG reviewed.  eICU Interventions  Patient with significant metabolic acidosis with respiratory compensation, one amp of bicarb bolused and Bicarb gtt at 50 ml / hour x 20 hours ordered.        Frederik Pear 03/08/2022, 8:44 PM

## 2022-03-31 NOTE — ED Provider Notes (Signed)
[facility] Provider Note   CSN: [id number] Arrival date & time: 04/06/2022  1441     History  Chief Complaint  Patient presents with   Irregular Heart Beat    [name] is a 77 y.o. male.  Pt is a 76y/o male with hx of CAD s/p CABG, Permanent atrial fibrillation with severe aortic stenosis but he was considered not a candidate part for TAVR because of his CKD and severe peripheral artery disease.  Last echo shows ejection fraction of 60 to 65% and metolazone 2.5 mg once a week and torsemide 80 mg twice a day presenting today with c/o of chest pain and SOB and then not acting himself.  Wife reports this morning he was having some chest pain and took a NTG and pain improved but then this afternooon worsening SOB and AMS.  When EMS arrived pt was in afib but reported en route he went into VTach and was cardioverted at [facility] while pt was still awake but lethargic.  Pt is on a NRB but sats still in the mid 80's but in the 70's before NRB.  Pt denies CP, abd pain at this time. Unclear if he has had fever or cough lately.  The history is provided by the EMS personnel and medical records.       Home Medications Prior to Admission medications   Medication Sig Start Date End Date Taking? Authorizing Provider  aspirin EC 81 MG tablet Take 1 tablet (81 mg total) by mouth daily. 04/11/19   Elam Dutch, MD  butalbital-acetaminophen-caffeine (FIORICET) [phone] MG tablet Take 1-2 tablets by mouth every 6 (six) hours as needed for headache. 03/14/22 03/14/23  Garwin Brothers, MD  butalbital-aspirin-caffeine [facility]) [phone] MG capsule Take 1 capsule by mouth every 4 (four) hours as needed for headache (Not to exceeded 6 capsules in 24hrs). 03/03/22   Garwin Brothers, MD  diltiazem (CARDIZEM CD) 120 MG 24 hr capsule TAKE 1 CAPSULE BY MOUTH EVERY DAY 07/19/21   Troy Sine, MD  dutasteride (AVODART) 0.5 MG capsule Take 0.5 mg by mouth daily. 11/24/21    [provider]  esomeprazole (NEXIUM) 40 MG capsule TAKE 1 CAPSULE (40 MG TOTAL) BY MOUTH 2 (TWO) TIMES DAILY BEFORE A MEAL. 02/07/22 04/08/22  Cirigliano, Vito V, DO  ezetimibe (ZETIA) 10 MG tablet TAKE 1 TABLET BY MOUTH EVERY DAY 10/15/21   Troy Sine, MD  fluticasone [facility]) 50 MCG/ACT nasal spray Place 1 spray into both nostrils daily. 02/01/22   Garwin Brothers, MD  Gabapentin Enacarbil (HORIZANT) 600 MG TBCR Take 1 tablet (600 mg total) by mouth daily. At about 5PM 03/09/22   Garwin Brothers, MD  isosorbide mononitrate (IMDUR) 30 MG 24 hr tablet Take 1 tablet (30 mg total) by mouth daily. 02/12/22   Samuella Cota, MD  KLOR-CON M20 20 MEQ tablet Take 20 mEq by mouth once a week. On Wednesday's    [provider]  loratadine (CLARITIN) 10 MG tablet Take 10 mg by mouth daily as needed for allergies, rhinitis or itching.  12/12/13   [provider]  lovastatin (MEVACOR) 40 MG tablet TAKE 1 TABLET BY MOUTH EVERYDAY AT BEDTIME 02/14/22   Troy Sine, MD  metolazone (ZAROXOLYN) 2.5 MG tablet Take 2.5 mg by mouth once a week. On Wednesday's 09/24/18   [provider]  metoprolol succinate (TOPROL-XL) 25 MG 24 hr tablet Take 1 tablet (25 mg total) by mouth 2 (two)  times daily. 02/11/22   Samuella Cota, MD  Multiple Vitamins-Minerals (MULTIVITAMINS THER. W/MINERALS) TABS Take 1 tablet by mouth every evening.    [provider]  nitroGLYCERIN (NITROLINGUAL) 0.4 MG/SPRAY spray PLACE 1 SPRAY UNDER THE TONGUE EVERY 5 (FIVE) MINUTES X 3 DOSES AS NEEDED FOR CHEST PAIN. 04/15/21   [name], MD  nitroGLYCERIN (NITROSTAT) 0.4 MG SL tablet PLACE 1 TABLET UNDER THE TONGUE EVERY 5 MINUTES AS NEEDED. Patient taking differently: Place 0.4 mg under the tongue every 5 (five) minutes as needed for chest pain. 02/03/22   Troy Sine, MD  ranolazine (RANEXA) 500 MG 12 hr tablet TAKE 1 TABLET BY MOUTH TWICE A DAY 02/08/22   Troy Sine, MD  senna-docusate  (SENOKOT-S) 8.6-50 MG tablet Take 1 tablet by mouth daily. 09/05/18   Irene Shipper, MD  sucralfate (CARAFATE) 1 GM/10ML suspension Take 10 mLs (1 g total) by mouth 4 (four) times daily -  with meals and at bedtime. 09/05/18 02/21/22  Irene Shipper, MD  torsemide (DEMADEX) 20 MG tablet Take 80 mg by mouth 2 (two) times daily. 11/20/20   [provider]  vitamin E 200 UNIT capsule Take 200 Units by mouth every evening.    [provider]      Allergies    Codeine, Sulfa antibiotics, Sulfonamide derivatives, Amiodarone, and Oxycodone    Review of Systems   Review of Systems  Physical Exam Updated Vital Signs BP (!) 88/71   Pulse (!) 113   Temp 99 F (37.2 C) (Temporal)   Resp (!) 22   Ht '5\' 5"'$  (1.651 m)   Wt 59.4 kg   SpO2 100%   BMI 21.80 kg/m  Physical Exam Vitals and nursing note reviewed.  Constitutional:      General: He is in acute distress.     Appearance: He is well-developed. He is ill-appearing and toxic-appearing.  HENT:     Head: Normocephalic and atraumatic.  Eyes:     Conjunctiva/sclera: Conjunctivae normal.     Pupils: Pupils are equal, round, and reactive to light.  Cardiovascular:     Rate and Rhythm: Tachycardia present. Rhythm irregular.     Heart sounds: No murmur heard. Pulmonary:     Effort: Tachypnea, accessory muscle usage and respiratory distress present.     Breath sounds: Wheezing and rhonchi present. No rales.  Abdominal:     General: There is no distension.     Palpations: Abdomen is soft.     Tenderness: There is no abdominal tenderness. There is no guarding or rebound.  Musculoskeletal:        General: No tenderness. Normal range of motion.     Cervical back: Normal range of motion and neck supple.     Right lower leg: Edema present.     Left lower leg: Edema present.     Comments: 3+ pitting edema up to the knee.  Scars consistent with bypass graft or vein harvest and feet are cool to the touch with poor cap refill in the  toes bilaterally  Skin:    General: Skin is warm and dry.     Findings: No erythema or rash.     Comments: Pearline Cables skin color  Neurological:     Comments: Oriented to self only.  Noted to move arms and legs without difficulty but pt is confused.  Will immediately open eyes when speaking with him  Psychiatric:        Behavior: Behavior normal.  ED Results / Procedures / Treatments   Labs (all labs ordered are listed, but only abnormal results are displayed) Labs Reviewed  COMPREHENSIVE METABOLIC PANEL - Abnormal; Notable for the following components:      Result Value   Chloride 97 (*)    CO2 19 (*)    Glucose, Bld 224 (*)    BUN 35 (*)    Creatinine, Ser 2.36 (*)    Calcium 8.8 (*)    Albumin 3.3 (*)    AST 59 (*)    GFR, Estimated 27 (*)    Anion gap 20 (*)    All other components within normal limits  LACTIC ACID, PLASMA - Abnormal; Notable for the following components:   Lactic Acid, Venous 5.0 (*)    All other components within normal limits  CBC WITH DIFFERENTIAL/PLATELET - Abnormal; Notable for the following components:   WBC 14.7 (*)    RBC 3.69 (*)    Hemoglobin 12.8 (*)    HCT 37.7 (*)    MCV 102.2 (*)    MCH 34.7 (*)    Neutro Abs 13.1 (*)    All other components within normal limits  BRAIN NATRIURETIC PEPTIDE - Abnormal; Notable for the following components:   B Natriuretic Peptide 916.6 (*)    All other components within normal limits  I-STAT ARTERIAL BLOOD GAS, ED - Abnormal; Notable for the following components:   pCO2 arterial 26.3 (*)    pO2, Arterial 240 (*)    Bicarbonate 17.2 (*)    TCO2 18 (*)    Acid-base deficit 6.0 (*)    Calcium, Ion 1.10 (*)    HCT 35.0 (*)    Hemoglobin 11.9 (*)    All other components within normal limits  I-STAT CHEM 8, ED - Abnormal; Notable for the following components:   BUN 33 (*)    Creatinine, Ser 2.30 (*)    Glucose, Bld 211 (*)    Calcium, Ion 1.01 (*)    TCO2 20 (*)    Hemoglobin 12.9 (*)    HCT 38.0  (*)    All other components within normal limits  TROPONIN I (HIGH SENSITIVITY) - Abnormal; Notable for the following components:   Troponin I (High Sensitivity) 10,290 (*)    All other components within normal limits  RESP PANEL BY RT-PCR (RSV, FLU A&B, COVID)  RVPGX2  CULTURE, BLOOD (ROUTINE X 2)  CULTURE, BLOOD (ROUTINE X 2)  PROTIME-INR  LACTIC ACID, PLASMA  URINALYSIS, ROUTINE W REFLEX MICROSCOPIC  MAGNESIUM  TROPONIN I (HIGH SENSITIVITY)    EKG EKG Interpretation  Date/Time:  Thursday March 31 2022 14:55:38 EST Ventricular Rate:  112 PR Interval:  157 QRS Duration: 148 QT Interval:  378 QTC Calculation: 516 R Axis:   118 Text Interpretation: Atrial fibrillation RBBB and LPFB ST depr, consider ischemia, inferior leads When compared to prior No significant change since last tracing Confirmed by Garnette Gunner [phone]) on 03/09/2022 2:59:53 PM  Radiology DG Chest Portable 1 View  Result Date: 03/08/2022 CLINICAL DATA:  Shortness of breath EXAM: PORTABLE CHEST 1 VIEW COMPARISON:  Chest x-ray dated February 10, 2022 FINDINGS: Cardiac and mediastinal contours are unchanged status post median sternotomy. Moderate right pleural effusion, increased in size when compared with prior exam. Central predominant interstitial opacities, increased when compared with the prior. No evidence of pneumothorax. IMPRESSION: 1. Moderate right pleural effusion, increased in size when compared with prior exam. 2. Increased interstitial opacities, likely due to worsened pulmonary edema. Electronically  Signed   By: Yetta Glassman M.D.   On: 03/29/2022 15:17    Procedures Procedures    Medications Ordered in ED Medications  ceFEPIme (MAXIPIME) 2 g in sodium chloride 0.9 % 100 mL IVPB (has no administration in time range)  vancomycin (VANCOREADY) IVPB 1250 mg/250 mL (has no administration in time range)  potassium chloride 10 mEq in 100 mL IVPB (has no administration in time range)  furosemide  (LASIX) injection 80 mg (80 mg Intravenous Given 04/02/2022 1541)    ED Course/ Medical Decision Making/ A&P                             Medical Decision Making Amount and/or Complexity of Data Reviewed Independent Historian: spouse External Data Reviewed: notes. Labs: ordered. Decision-making details documented in ED Course. Radiology: ordered and independent interpretation performed. Decision-making details documented in ED Course. ECG/medicine tests: ordered and independent interpretation performed. Decision-making details documented in ED Course.  Risk Prescription drug management. Decision regarding hospitalization.   Pt with multiple medical problems and comorbidities and presenting today with a complaint that caries a high risk for morbidity and mortality.  Here today with respiratory distress, altered mental status and earlier having chest pain.  EMS did cardiovert patient as they reported he went into V. tach while in the truck at [address].  Upon arrival here patient will open his eyes when you talk with him but is confused.  He is in respiratory distress at this time on a nonrebreather with sats in the mid 80s.  Patient has decreased breath sounds on the right and some wheezing on the left.  He has edema noted in bilateral lower extremities which based on prior outside records has been gradually worsening.  Patient has known history of severe aortic stenosis but is unable to get a TAVR due to his chronic medical conditions.  He takes a large amount of diuretics daily and has a history of chronic kidney disease with last creatinine around 2.3.  Patient denies any chest pain at this time unclear if he has had infectious symptoms.  Patient was placed on BiPAP due to work of breathing with improvement in his work of breathing and O2 sats.  I have independently visualized and interpreted pt's images today.  Chest x-ray concerning for fluid overload with right-sided pleural effusion.  I  independently interpreted patient's EKG and labs.  EKG shows atrial fibrillation which is chronic and a right bundle branch block which is unchanged.  CBC with leukocytosis of 14 of unknown etiology, stable hemoglobin and platelet count, Chem-8 with unchanged creatinine of 2.3, ABG with compensated metabolic acidosis.  Patient given IV Lasix, recently had a discussion with his doctor about hospice care but currently all we have on file is that patient is a full code.  Will attempt to discuss with wife.  5:05 PM Patient's lactic it is elevated at 5, troponin is 10,000, BNP in the 900s, patient did have a short run of tachycardia that appears to be V. tach or an aberrant wide complex rhythm from A-fib as he does have a bundle branch block.  Patient is wife in family is now present.  Discussed with her end-of-life goals.  At this time it was decided that patient would be a DNR DNI but they want to continue therapy at this time.  Cardiology consulted for further recommendations.  Patient's blood pressure is slowly decreasing may need a pressor.  Will  defer to cardiology on this.  Also his wife reported that she noticed he had a little blood in his urine today and was concern for possible UTI.  With oral temperature of 99, white count of 15 and lactic acid will cover with Vanco and cefepime.  Critical care to admit.  COVID is negative.  CRITICAL CARE Performed by: Jaziel Bennett Total critical care time: 50 minutes Critical care time was exclusive of separately billable procedures and treating other patients. Critical care was necessary to treat or prevent imminent or life-threatening deterioration. Critical care was time spent personally by me on the following activities: development of treatment plan with patient and/or surrogate as well as nursing, discussions with consultants, evaluation of patient's response to treatment, examination of patient, obtaining history from patient or surrogate, ordering and  performing treatments and interventions, ordering and review of laboratory studies, ordering and review of radiographic studies, pulse oximetry and re-evaluation of patient's condition.         Final Clinical Impression(s) / ED Diagnoses Final diagnoses:  Atrial fibrillation with RVR ([place])  Aortic valve stenosis, etiology of cardiac valve disease unspecified  ACS (acute coronary syndrome) (HCC)  Acute on chronic right-sided congestive heart failure (HCC)  Sepsis with acute hypoxic respiratory failure and septic shock, due to unspecified organism [facility])    Rx / DC Orders ED Discharge Orders     None         Blanchie Dessert, MD 03/18/2022 1708

## 2022-03-31 NOTE — H&P (Signed)
NAME:  [name], MRN:  [id number], DOB:  06-27-1945, LOS: 0 ADMISSION DATE:  03/24/2022, CONSULTATION DATE:  1/25  REFERRING MD:  Blanchie Dessert MD, CHIEF COMPLAINT:  Chest Pain and SOB   History of Present Illness:  [name] is a 77 year old male with a extensive cardiac past medical history of CAD, multiple CABGs as well as numerous PCIs, Afib on no current anticoag hx bc of GI bleeding, lymphedema, PAD with BL SFA occlusion left to right fem bypass 2021, chronic anemia, renal artery stenosis with right stenting, left subclavian stenosis, HTN, HLD, CKD Stage III, and GERD with gastritis and esophagitis who presented by EMS with chest pain and SOB requiring NRB due to hypoxia O2 Sats in the 70s. On route to EMS patient's rhythm progressed from Afib RVR to a wide complex ventricular tachycardia that was intervened by cardioversion. Patient had an approximately 20 seconds of VTACH with pulse within the ED. Patient place on BIPAP due to hypoxia and PCCM was consulted for further management. Patient opens eyes to voice and currently oriented x 4. Patient denies any current chest pain but still experiencing SOB. Patient complains of lower right leg pain which he characterizes as throbbing, hot, and painful with exertion. Patient denies any recent sick contacts, nausea, vomiting, hemoptysis, hematochezia, or any signs of abnormal bruising or bleeding.   Pertinent  Medical History   Past Medical History:  Diagnosis Date   Anemia    Angina    Aortic stenosis    Atrial fibrillation ([place])    Blood transfusion    Bronchitis    Bulging discs    "8 of them; thoracic, lumbar, sacral area"   CHF (congestive heart failure) (HCC)    Chronic back pain    Chronic kidney disease    "mild kidney disease"   Colon polyps    Coronary artery disease 01/24/11   Successful PCI long segmental stensois  vein graft to the CX marginal vessel-graft previously stented prox. 3.0x82m TAXUS stent  mid seg 3.0x217mTAXUS stent now tandem stens of 3.0x3861m 3.0x8mm1maced with the seg. 50,80 & 99% stenosis reduced to 0%   DDD (degenerative disc disease)    Diastolic heart failure (HCC)[place] Dysrhythmia    "PAC's and PVC's"   Esophageal dilatation    "have had it done 7 times; last time ~ 2001"   Esophageal stricture    GERD (gastroesophageal reflux disease)    Headache(784.0)    Heart murmur    Hiatal hernia    Hyperlipidemia    Hypertension    [facility]   Myocardial infarction (HCC)[place]05   "had 3 heart attacks this year"   Paroxysmal atrial fibrillation (HCC)[place] Pneumonia 2020   Pyloric stenosis    RBBB    Renal artery stenosis (HCC)    hhistory of right renal artery stenting and re-intervention for "in-stent restenosis.   S/P CABG (coronary artery bypass graft) 1984[place]ness of breath    "when I had the heart attacks"   Subclavian artery stenosis, left (HCC)[place] Ulcer    "small; Dr. PerrHenrene Pastornd it 02/2010"     Significant Hospital Events: Including procedures, antibiotic start and stop dates in addition to other pertinent events   1/25 Admit to ICU due to Chest pain/SOB and episodes of [facility] cardio[facility]   Interim History / Subjective:  Acute chronically ill male hemodynamically unstable   Objective   Blood pressure 90/68, pulse 92, temperature 99 F (37.2 C), temperature source Temporal, resp. rate (!) 26, height '5\' 5"'$  (1.651 m), weight 59.4 kg, SpO2 100 %.    FiO2 (%):  [40 %] 40 %  No intake or output data in the 24 hours ending 03/24/2022 1806 Filed Weights   04/02/2022 1451  Weight: 59.4 kg    Examination: General: Acute chronically ill male lying on ED Stretcher  HENT: Normocephalic, MM dry and pink on BIPAP Lungs: crackles  and Diminished to auscultation, tachypnea, labored breathing on BIPAP Cardiovascular: s1,s2, no murmors, gallops, friction rubs noted, rate, rhythm  irregular  Abdomen: BS + in all quadrants, abdomen soft, nontender  Extremities: generalized weakness, ulcerations on right leg, 2+ edema in lower extremities BL  Neuro: opens eyes to voice, oriented x 4, able to follow simple commands GU: male genitalia intact, perineal edema   Resolved Hospital Problem list   N/A   Assessment & Plan:  NSTEMI  -Troponin 10,290, patient admitted with Chest pain and SOB, currently in Afib with moderate ventricular response but has had an episode of wide complex VTACH requiring cardioversion per EMS, and a 20 second episode of VTACH with a pulse in the ED.  Acute Heart Failure Exacerbation with flash pulmonary edema  Last ECHO 60-65%, LV had normal function and no regional wall abnormalities. RV also normal. Mitral valve regurg.  Moderate to Severe Aortic Valve Stenosis Patient initially hypoxic on NRB transitioned to BIPAP, Crackles and diminished breath sounds throughout lung fields, chest x-ray indicates pleural effusion/edema on right side. Patient hypotensive, SBP within the 90s P:  Admit to ICU  Monitor Cardiac Tele  Start peripheral levophed if MAP <65  Continue to have defib pads on with Zoll and connected due to episodes of VTACH  Continuous Vital Signs  Lasix '80mg'$  given, will reassess further need of diuresis, bladder scan, if needs In and out, would recommend foley  Discussed with patient and family if patient becomes worse, transition to comfort, palliative consult ordered   Acute Hypoxic Respiratory failure  NRB with hypoxia, needing BIPAP, respiratory distress and increase WOB P:  Continue BIPAP Continue pulse ox Keep O2 sats > 90%  Morphine prn for pain and SOB  Leukocytosis with lactic acidosis WBC 14.7, lactic 5.0  Lactic 2.3 trending down  Right leg has superficial ulcerations, leg is warm to the touch, red  P: Continue to follow CBC, lactic acids Received cefepime and vanc  Continue empiric antibiotics for now  Monitor temps  with other VS   CKD Stage III Cr 2.30, oliguric, bilateral lower extemity edema 2+  P:  Continue lasix administration And Monitor BMETS daily with electrolytes  Monitor Strict I/O   Best Practice (right click and "Reselect all SmartList Selections" daily)   Diet/type: NPO DVT prophylaxis: prophylactic heparin  GI prophylaxis: N/A Lines: N/A Foley:  N/A Code Status:  limited Last date of multidisciplinary goals of care discussion [patient and family updated at bedside on 1/25  Labs   CBC: Recent Labs  Lab 03/24/2022 1455 03/09/2022 1500 03/17/2022 1504  WBC 14.7*  --   --   NEUTROABS 13.1*  --   --   HGB 12.8* 11.9* 12.9*  HCT 37.7* 35.0* 38.0*  MCV 102.2*  --   --   PLT 192  --   --     Basic Metabolic Panel: Recent Labs  Lab 04/06/2022 1455 03/16/2022 1500 03/19/2022  1504  NA 136 135 137  K 3.6 3.9 3.6  CL 97*  --  100  CO2 19*  --   --   GLUCOSE 224*  --  211*  BUN 35*  --  33*  CREATININE 2.36*  --  2.30*  CALCIUM 8.8*  --   --    GFR: Estimated Creatinine Clearance: 22.2 mL/min (A) (by C-G formula based on SCr of 2.3 mg/dL (H)). Recent Labs  Lab 03/30/2022 1455 03/17/2022 1456  WBC 14.7*  --   LATICACIDVEN  --  5.0*    Liver Function Tests: Recent Labs  Lab 03/18/2022 1455  AST 59*  ALT 14  ALKPHOS 83  BILITOT 0.8  PROT 6.9  ALBUMIN 3.3*   No results for input(s): "LIPASE", "AMYLASE" in the last 168 hours. No results for input(s): "AMMONIA" in the last 168 hours.  ABG    Component Value Date/Time   PHART 7.425 03/20/2022 1500   PCO2ART 26.3 (L) 03/14/2022 1500   PO2ART 240 (H) 03/10/2022 1500   HCO3 17.2 (L) 04/05/2022 1500   TCO2 20 (L) 03/14/2022 1504   ACIDBASEDEF 6.0 (H) 03/16/2022 1500   O2SAT 100 03/30/2022 1500     Coagulation Profile: Recent Labs  Lab 03/16/2022 1455  INR 1.2    Cardiac Enzymes: No results for input(s): "CKTOTAL", "CKMB", "CKMBINDEX", "TROPONINI" in the last 168 hours.  HbA1C: No results found for:  "HGBA1C"  CBG: No results for input(s): "GLUCAP" in the last 168 hours.  Review of Systems:   Please see the history of present illness. All other systems reviewed and are negative   Past Medical History:  He,  has a past medical history of Anemia, Angina, Aortic stenosis, Atrial fibrillation ([place]), Blood transfusion, Bronchitis, Bulging discs, CHF (congestive heart failure) (HCC), Chronic back pain, Chronic kidney disease, Colon polyps, Coronary artery disease (01/24/11), DDD (degenerative disc disease), Diastolic heart failure ([place]), Dysrhythmia, Esophageal dilatation, Esophageal stricture, GERD (gastroesophageal reflux disease), Headache(784.0), Heart murmur, Hiatal hernia, Hyperlipidemia, Hypertension, Myocardial infarction ([place]) (1991), Myocardial infarction ([place]) (2005), Paroxysmal atrial fibrillation ([place]), Pneumonia (2020), Pyloric stenosis, RBBB, Renal artery stenosis (HCC), S/P CABG (coronary artery bypass graft) (1984 & 1996), Shortness of breath, Subclavian artery stenosis, left ([place]), and Ulcer.   Surgical History:   Past Surgical History:  Procedure Laterality Date   ABDOMINAL AORTOGRAM W/LOWER EXTREMITY N/A 05/10/2019   Procedure: ABDOMINAL AORTOGRAM W/LOWER EXTREMITY;  Surgeon: Elam Dutch, MD;  Location: [place] CV LAB;  Service: Cardiovascular;  Laterality: N/A;   ADENOIDECTOMY     "when I was an infant"   APPENDECTOMY  4076   APPLICATION OF WOUND VAC Left 07/23/2019   Procedure: Application Of Wound Vac;  Surgeon: Angelia Mould, MD;  Location: [place];  Service: Vascular;  Laterality: Left;   BALLOON DILATION N/A 10/14/2018   Procedure: BALLOON DILATION;  Surgeon: Lavena Bullion, DO;  Location: [place];  Service: Gastroenterology;  Laterality: N/A;   BIOPSY  07/26/2018   Procedure: BIOPSY;  Surgeon: Thornton Park, MD;  Location: [place];  Service: Gastroenterology;;   BIOPSY  10/14/2018   Procedure: BIOPSY;  Surgeon: Lavena Bullion, DO;   Location: [place];  Service: Gastroenterology;;   CARDIOVERSION N/A 04/17/2017   Procedure: CARDIOVERSION;  Surgeon: Jerline Pain, MD;  [facility] ENDOSCOPY;  Service: Cardiovascular;  Laterality: N/A;   CAROTID ENDARTERECTOMY Right 1996   CE   COLONOSCOPY WITH PROPOFOL N/A 10/14/2018   Procedure: COLONOSCOPY WITH PROPOFOL;  Surgeon: Gerrit Heck  V, DO;  Location: [place];  Service: Gastroenterology;  Laterality: N/A;   CORONARY ANGIOPLASTY WITH STENT PLACEMENT  01/24/11   "today makes a total of 9 stents"   CORONARY ARTERY BYPASS GRAFT  1984   CABG X 4   CORONARY ARTERY BYPASS GRAFT  1996   CABG X 4   ENDARTERECTOMY FEMORAL Right 07/15/2019   Procedure: RIGHT FEMORAL ENDARTARECTOMY WITH PROFUNDOPLASTY;  Surgeon: Elam Dutch, MD;  Location: [place];  Service: Vascular;  Laterality: Right;   ESOPHAGOGASTRODUODENOSCOPY (EGD) WITH PROPOFOL N/A 07/26/2018   Procedure: ESOPHAGOGASTRODUODENOSCOPY (EGD) WITH PROPOFOL;  Surgeon: Thornton Park, MD;  Location: [place];  Service: Gastroenterology;  Laterality: N/A;   ESOPHAGOGASTRODUODENOSCOPY (EGD) WITH PROPOFOL N/A 10/14/2018   Procedure: ESOPHAGOGASTRODUODENOSCOPY (EGD) WITH PROPOFOL;  Surgeon: Lavena Bullion, DO;  Location: [place];  Service: Gastroenterology;  Laterality: N/A;   EYE SURGERY  2009   detached retina in right eye   FEMORAL-FEMORAL BYPASS GRAFT Bilateral 07/15/2019   Procedure: LEFT TO RIGHT FEMORAL-FEMORAL ARTERY;  Surgeon: Elam Dutch, MD;  Location[facility] OR;  Service: Vascular;  Laterality: Bilateral;   FRACTURE SURGERY  07/03/2018   fractured right hip sx.   IR THORACENTESIS ASP PLEURAL SPACE W/IMG GUIDE  10/12/2018   LEFT HEART CATHETERIZATION WITH CORONARY/GRAFT ANGIOGRAM N/A 01/24/2011   Procedure: LEFT HEART CATHETERIZATION WITH Beatrix Fetters;  Surgeon: Troy Sine, MD;  Location[facility] CATH LAB;  Service: Cardiovascular;  Laterality: N/A;   PERIPHERAL VASCULAR INTERVENTION   07/16/2018   Procedure: PERIPHERAL VASCULAR INTERVENTION;  Surgeon: Elam Dutch, MD;  Location: [place] CV LAB;  Service: Cardiovascular;;  SMA and Celiac   pylondial cyst removal     RETINAL DETACHMENT SURGERY  04/2007   right   TONSILLECTOMY  1972   VISCERAL ANGIOGRAPHY N/A 07/16/2018   Procedure: VISCERAL ANGIOGRAPHY;  Surgeon: Elam Dutch, MD;  Location: [place] CV LAB;  Service: Cardiovascular;  Laterality: N/A;   WOUND DEBRIDEMENT Left 07/23/2019   Procedure: INCISION AND DRAINAGE LEFT GROIN WOUND;  Surgeon: Angelia Mould, MD;  Location: [facility] OR;  Service: Vascular;  Laterality: Left;     Social History:   reports that he quit smoking about 17 years ago. His smoking use included cigarettes. He has a 38.00 pack-year smoking history. He has never been exposed to tobacco smoke. He has never used smokeless tobacco. He reports that he does not drink alcohol and does not use drugs.   Family History:  His family history includes AAA (abdominal aortic aneurysm) in his father; Breast cancer in his paternal aunt; Diabetes in his sister; Heart attack in his father; Heart disease in his father; Heart failure in his father; Hypertension in his mother; Stroke in his mother; Ulcers in his father. There is no history of Colon cancer.   Allergies Allergies  Allergen Reactions   Codeine Nausea And Vomiting   Sulfa Antibiotics Rash   Sulfonamide Derivatives Rash   Amiodarone     Developed tremor ultimately requiring discontinuation   Oxycodone Nausea Only     Home Medications  Prior to Admission medications   Medication Sig Start Date End Date Taking? Authorizing Provider  aspirin EC 81 MG tablet Take 1 tablet (81 mg total) by mouth daily. 04/11/19   Elam Dutch, MD  butalbital-acetaminophen-caffeine (FIORICET) [phone] MG tablet Take 1-2 tablets by mouth every 6 (six) hours as needed for headache. 03/14/22 03/14/23  Garwin Brothers, MD  butalbital-aspirin-caffeine [facility])  [phone] MG capsule Take 1 capsule  by mouth every 4 (four) hours as needed for headache (Not to exceeded 6 capsules in 24hrs). 03/03/22   Garwin Brothers, MD  diltiazem (CARDIZEM CD) 120 MG 24 hr capsule TAKE 1 CAPSULE BY MOUTH EVERY DAY 07/19/21   Troy Sine, MD  dutasteride (AVODART) 0.5 MG capsule Take 0.5 mg by mouth daily. 11/24/21   [provider]  esomeprazole (NEXIUM) 40 MG capsule TAKE 1 CAPSULE (40 MG TOTAL) BY MOUTH 2 (TWO) TIMES DAILY BEFORE A MEAL. 02/07/22 04/08/22  Cirigliano, Vito V, DO  ezetimibe (ZETIA) 10 MG tablet TAKE 1 TABLET BY MOUTH EVERY DAY 10/15/21   Troy Sine, MD  fluticasone [facility]) 50 MCG/ACT nasal spray Place 1 spray into both nostrils daily. 02/01/22   Garwin Brothers, MD  Gabapentin Enacarbil (HORIZANT) 600 MG TBCR Take 1 tablet (600 mg total) by mouth daily. At about 5PM 03/09/22   Garwin Brothers, MD  isosorbide mononitrate (IMDUR) 30 MG 24 hr tablet Take 1 tablet (30 mg total) by mouth daily. 02/12/22   Samuella Cota, MD  KLOR-CON M20 20 MEQ tablet Take 20 mEq by mouth once a week. On Wednesday's    [provider]  loratadine (CLARITIN) 10 MG tablet Take 10 mg by mouth daily as needed for allergies, rhinitis or itching.  12/12/13   [provider]  lovastatin (MEVACOR) 40 MG tablet TAKE 1 TABLET BY MOUTH EVERYDAY AT BEDTIME 02/14/22   Troy Sine, MD  metolazone (ZAROXOLYN) 2.5 MG tablet Take 2.5 mg by mouth once a week. On Wednesday's 09/24/18   [provider]  metoprolol succinate (TOPROL-XL) 25 MG 24 hr tablet Take 1 tablet (25 mg total) by mouth 2 (two) times daily. 02/11/22   Samuella Cota, MD  Multiple Vitamins-Minerals (MULTIVITAMINS THER. W/MINERALS) TABS Take 1 tablet by mouth every evening.    [provider]  nitroGLYCERIN (NITROLINGUAL) 0.4 MG/SPRAY spray PLACE 1 SPRAY UNDER THE TONGUE EVERY 5 (FIVE) MINUTES X 3 DOSES AS NEEDED FOR CHEST PAIN. 04/15/21   [name], MD  nitroGLYCERIN (NITROSTAT) 0.4  MG SL tablet PLACE 1 TABLET UNDER THE TONGUE EVERY 5 MINUTES AS NEEDED. Patient taking differently: Place 0.4 mg under the tongue every 5 (five) minutes as needed for chest pain. 02/03/22   Troy Sine, MD  ranolazine (RANEXA) 500 MG 12 hr tablet TAKE 1 TABLET BY MOUTH TWICE A DAY 02/08/22   Troy Sine, MD  senna-docusate (SENOKOT-S) 8.6-50 MG tablet Take 1 tablet by mouth daily. 09/05/18   Irene Shipper, MD  sucralfate (CARAFATE) 1 GM/10ML suspension Take 10 mLs (1 g total) by mouth 4 (four) times daily -  with meals and at bedtime. 09/05/18 02/21/22  Irene Shipper, MD  torsemide (DEMADEX) 20 MG tablet Take 80 mg by mouth 2 (two) times daily. 11/20/20   [provider]  vitamin E 200 UNIT capsule Take 200 Units by mouth every evening.    [provider]     Critical care time: 30 mins   CRITICAL CARE Performed by: Waldo Laine RN, BSN, S-ACNP   Total critical care time: 30 minutes  Critical care time was exclusive of separately billable procedures and treating other patients.  Critical care was necessary to treat or prevent imminent or life-threatening deterioration.  Critical care was time spent personally by me on the following activities: development of treatment plan with patient and/or surrogate as well as nursing, discussions with consultants, evaluation of patient's response to treatment, examination  of patient, obtaining history from patient or surrogate, ordering and performing treatments and interventions, ordering and review of laboratory studies, ordering and review of radiographic studies, pulse oximetry and re-evaluation of patient's condition.   Waldo Laine RN, BSN, S-ACNP

## 2022-03-31 NOTE — ED Notes (Signed)
Family at bedside. 

## 2022-03-31 NOTE — Progress Notes (Signed)
Pharmacy Antibiotic Note  [name] is a 77 y.o. male for which pharmacy has been consulted for cefepime and vancomycin dosing for sepsis.  Patient with a history of CAD s/p CABG x 2, multiple PCIs, atrial fibrillation, severe aortic stenosis, PAD, hypertension, CKDIII and GERD. Patient presenting with chest pain and dyspnea.  SCr 2.3 - near baseline WBC 14.7; LA 5>2.3; T 99; HR 107; RR 22 COVID neg / flu neg  Plan: Cefepime 2g q24hr Vancomycin 1250 mg once then 1000 mg q48hr (eAUC 511.5) unless change in renal function Trend WBC, Fever, Renal function F/u cultures, clinical course, WBC De-escalate when able  Height: '5\' 5"'$  (165.1 cm) Weight: 59.4 kg (131 lb) IBW/kg (Calculated) : 61.5  Temp (24hrs), Avg:99 F (37.2 C), Min:99 F (37.2 C), Max:99 F (37.2 C)  Recent Labs  Lab 03/30/2022 1455 03/14/2022 1456 03/18/2022 1504  WBC 14.7*  --   --   CREATININE 2.36*  --  2.30*  LATICACIDVEN  --  5.0*  --     Estimated Creatinine Clearance: 22.2 mL/min (A) (by C-G formula based on SCr of 2.3 mg/dL (H)).    Allergies  Allergen Reactions   Codeine Nausea And Vomiting   Sulfa Antibiotics Rash   Sulfonamide Derivatives Rash   Amiodarone     Developed tremor ultimately requiring discontinuation   Oxycodone Nausea Only    Antimicrobials this admission: vancomycin 1/25 >>  cefepime 1/25 >>   Microbiology results: Pending  Thank you for allowing pharmacy to be a part of this patient's care.  Lorelei Pont, PharmD, BCPS 03/08/2022 4:35 PM ED Clinical Pharmacist -  [phone]

## 2022-03-31 NOTE — ED Notes (Signed)
Pt had 20 seconds of Vtach with a pulse, HR got to 201. EKG printed and MD notified.

## 2022-03-31 NOTE — Progress Notes (Signed)
IV Consult generated r/t order for Vasopressors.  Spoke with RN, [place] who states there is not currently a need for the u/s guided PIV.  Advised to place a new consult if need arises.  RN agreeable.

## 2022-03-31 NOTE — Consult Note (Addendum)
Cardiology Consult   Patient ID: [name] MRN: [id number]; DOB: [birth year]   Admission date: 03/29/2022  PCP:  Garwin Brothers, [place] [facility] Cardiologist:  Shelva Majestic, MD  Electrophysiologist:  Constance Haw, MD  {  Chief Complaint:  chest pain and shortness of breath  Patient Profile:   [name] is a 77 y.o. male with a history of CAD s/p CABG in 1984 in [place] with redo CABG in 1996 and multiple subsequent PCIs,  permanent atrial fibrillation not on anticoagulation due to GI bleeds and anemia, lymphedema, PAD with bilateral SFA occlusion s/p left to right fem-fem bypass in 07/2019, carotid artery disease s/p right CEA in 2005, chronic left subclavian artery stenosis, renal artery stenosis s/p right renal artery stenting and repeat intervention for in-stent restenosis, hypertension, hyperlipidemia, CKD stage III, GERD with gastritis/ esophagitis, and chronic anemia who is being seen 04/04/2022 for the evaluation of chest pain and shortness of breath.  History of Present Illness:   [name] is a 77 year old male with the above history who is followed by Dr. Claiborne Billings and Dr. Curt Bears. He has a complex medical history as described above. He has a long history of CAD and underwent CABG in 1984 in [place] and then redo CABG in 1996. He has had multiple PCIs since that time. His wife states he has had 8 MIs and 11 stents over the years. During last cardiac catheterization in 01/2011, he underwent successful cutting balloon atherectomy and subsequent DES of the left main and proximal LAD and POBA of the proximal to mid LAD beyond Diag 3 and large septal perforator branch. He has had multiple Myoviews since that time. Last Myoview in 07/2019 was low risk with findings consistent with prior MI and peri-infarct ischemia. He also has significant vascular disease inlving the lower extremities, carotids, and renal arteries and has undergone multiple  intervention. In addition, he has known severe aortic stenosis. Last Echo in 10/2021 showed LVEF of 60-65% with no regional wall motion, normal RV, mild MR, moderate to severe aortic stenosis.   He was recently admitted in 02/2022 for NSTEMI. High-sensitivity troponin peaked at 1,717 at that time. He was not felt to be a candidate for any invasive work-up (including cardiac catheterization and TAVR) due to multiple comorbiditis including renal function and limited vascular access due to severe PAD. He was treated medically and diuresed. He was last seen by Nicholes Rough, PA-C, for follow-up on 02/21/2022 at which time he denied any recurrent chest pain.  Patient presented back to the ED via EMS today for chest pain and shortness of breath. En route to the ED, patient reportedly went into VT and was successful cardioverted. Upon arrival to the ED, patient hypotension, tachycardic, and hypoxic with O2 sats in the 70s. He was placed on BiPAP. Initial EKG showed atrial fibrillation, rate 112 bpm, with ST depression in multiple leads. However, he did have 20 seconds of recurrent VT with heart rates in the 190s to low 200s. He came out of this on his own. High-sensitivity troponin 10,290. BNP 916. Chest x-ray showed moderate right pleural effusion (increased in size from prior exam) and increased interstitial opacities likely due to worsened pulmonary edema. WBC 14.7, Hgb 12.8, Plts 192. Na 136, K 3.6, Glucose 224, BUN 35, Cr 2.36. Albumin 3.3, AST 59, ALT 14, Alk Phos 83, Total Bili 0.8. Lactic acid 5.0. Cardiology consulted.  Patient is currently on BiPAP and he  is hypotensive with BP in the 80s/60s. He is cachetic and looks very uncomfortable. Wife reports that he was doing well since last hospitalization but the developed recurrent chest pain this morning with associated shortness of breath. He has chronic lower extremity as well as wounds on his right leg. Wife also mentions concerns for possible UTI as she noted  some hematuria. She mentions that patient is very resilient and has overcome the worst odds. We had a long discussion with family about patient's prognosis and how unfortunately we do not have much to offer. Wife states that even if patient only has a "10% chance" of making it through a procedure, he would want to take that chance since he has overcome so much in the past. However, we explained that he is unfortunately not a candidate for any invasive work-up. They were understanding.    Past Medical History:  Diagnosis Date   Anemia    Angina    Aortic stenosis    Atrial fibrillation ([place])    Blood transfusion    Bronchitis    Bulging discs    "8 of them; thoracic, lumbar, sacral area"   CHF (congestive heart failure) (HCC)    Chronic back pain    Chronic kidney disease    "mild kidney disease"   Colon polyps    Coronary artery disease 01/24/11   Successful PCI long segmental stensois  vein graft to the CX marginal vessel-graft previously stented prox. 3.0x52m TAXUS stent mid seg 3.0x249mTAXUS stent now tandem stens of 3.0x3848m 3.0x8mm52maced with the seg. 50,80 & 99% stenosis reduced to 0%   DDD (degenerative disc disease)    Diastolic heart failure (HCC)[place] Dysrhythmia    "PAC's and PVC's"   Esophageal dilatation    "have had it done 7 times; last time ~ 2001"   Esophageal stricture    GERD (gastroesophageal reflux disease)    Headache(784.0)    Heart murmur    Hiatal hernia    Hyperlipidemia    Hypertension    [facility]   Myocardial infarction (HCC)[place]05   "had 3 heart attacks this year"   Paroxysmal atrial fibrillation (HCC)[place] Pneumonia 2020   Pyloric stenosis    RBBB    Renal artery stenosis (HCC)    hhistory of right renal artery stenting and re-intervention for "in-stent restenosis.   S/P CABG (coronary artery bypass graft) [place]    "when I had the heart attacks"    Subclavian artery stenosis, left (HCC)[place] Ulcer    "small; Dr. PerrHenrene Pastornd it 02/2010"    Past Surgical History:  Procedure Laterality Date   ABDOMINAL AORTOGRAM W/LOWER EXTREMITY N/A 05/10/2019   Procedure: ABDOMINAL AORTOGRAM W/LOWER EXTREMITY;  Surgeon: FielElam Dutch;  [facility];  Service: Cardiovascular;  Laterality: N/A;   ADENOIDECTOMY     "when I was an infant"   APPENDECTOMY  [id number] OF WOUND VAC Left 07/23/2019   Procedure: Application Of Wound Vac;  Surgeon: DickAngelia Mould;  [facility];  Laterality: Left;   BALLOON DILATION N/A 10/14/2018   Procedure: BALLOON DILATION;  Surgeon: CiriLavena Bullion;  [facility]enterology;  Laterality: N/A;   BIOPSY  07/26/2018   Procedure: BIOPSY;  Surgeon: BeavThornton Park;  [facility]  Service: Gastroenterology;;   BIOPSY  10/14/2018   Procedure: BIOPSY;  Surgeon: Lavena Bullion, DO;  Location: [place];  Service: Gastroenterology;;   CARDIOVERSION N/A 04/17/2017   Procedure: CARDIOVERSION;  Surgeon: Jerline Pain, MD;  Location: [place];  Service: Cardiovascular;  Laterality: N/A;   CAROTID ENDARTERECTOMY Right 1996   CE   COLONOSCOPY WITH PROPOFOL N/A 10/14/2018   Procedure: COLONOSCOPY WITH PROPOFOL;  Surgeon: Lavena Bullion, DO;  Location: [place];  Service: Gastroenterology;  Laterality: N/A;   CORONARY ANGIOPLASTY WITH STENT PLACEMENT  01/24/11   "today makes a total of 9 stents"   CORONARY ARTERY BYPASS GRAFT  1984   CABG X 4   CORONARY ARTERY BYPASS GRAFT  1996   CABG X 4   ENDARTERECTOMY FEMORAL Right 07/15/2019   Procedure: RIGHT FEMORAL ENDARTARECTOMY WITH PROFUNDOPLASTY;  Surgeon: Elam Dutch, MD;  Location: [place];  Service: Vascular;  Laterality: Right;   ESOPHAGOGASTRODUODENOSCOPY (EGD) WITH PROPOFOL N/A 07/26/2018   Procedure: ESOPHAGOGASTRODUODENOSCOPY (EGD) WITH PROPOFOL;   Surgeon: Thornton Park, MD;  Location: [place];  Service: Gastroenterology;  Laterality: N/A;   ESOPHAGOGASTRODUODENOSCOPY (EGD) WITH PROPOFOL N/A 10/14/2018   Procedure: ESOPHAGOGASTRODUODENOSCOPY (EGD) WITH PROPOFOL;  Surgeon: Lavena Bullion, DO;  Location: [place];  Service: Gastroenterology;  Laterality: N/A;   EYE SURGERY  2009   detached retina in right eye   FEMORAL-FEMORAL BYPASS GRAFT Bilateral 07/15/2019   Procedure: LEFT TO RIGHT FEMORAL-FEMORAL ARTERY;  Surgeon: Elam Dutch, MD;  [facility];  Service: Vascular;  Laterality: Bilateral;   FRACTURE SURGERY  07/03/2018   fractured right hip sx.   IR THORACENTESIS ASP PLEURAL SPACE W/IMG GUIDE  10/12/2018   LEFT HEART CATHETERIZATION WITH CORONARY/GRAFT ANGIOGRAM N/A 01/24/2011   Procedure: LEFT HEART CATHETERIZATION WITH Beatrix Fetters;  Surgeon: Troy Sine, MD;  Location[facility] CATH LAB;  Service: Cardiovascular;  Laterality: N/A;   PERIPHERAL VASCULAR INTERVENTION  07/16/2018   Procedure: PERIPHERAL VASCULAR INTERVENTION;  Surgeon: Elam Dutch, MD;  Location: [place] CV LAB;  Service: Cardiovascular;;  SMA and Celiac   pylondial cyst removal     RETINAL DETACHMENT SURGERY  04/2007   right   TONSILLECTOMY  1972   VISCERAL ANGIOGRAPHY N/A 07/16/2018   Procedure: VISCERAL ANGIOGRAPHY;  Surgeon: Elam Dutch, MD;  Location: [place] CV LAB;  Service: Cardiovascular;  Laterality: N/A;   WOUND DEBRIDEMENT Left 07/23/2019   Procedure: INCISION AND DRAINAGE LEFT GROIN WOUND;  Surgeon: Angelia Mould, MD;  Location: [facility] OR;  Service: Vascular;  Laterality: Left;     Medications Prior to Admission: Prior to Admission medications   Medication Sig Start Date End Date Taking? Authorizing Provider  aspirin EC 81 MG tablet Take 1 tablet (81 mg total) by mouth daily. 04/11/19   Elam Dutch, MD  butalbital-acetaminophen-caffeine (FIORICET) [id number] MG tablet Take 1-2 tablets by mouth  every 6 (six) hours as needed for headache. 03/14/22 03/14/23  Garwin Brothers, MD  butalbital-aspirin-caffeine [facility]) [phone] MG capsule Take 1 capsule by mouth every 4 (four) hours as needed for headache (Not to exceeded 6 capsules in 24hrs). 03/03/22   Garwin Brothers, MD  diltiazem (CARDIZEM CD) 120 MG 24 hr capsule TAKE 1 CAPSULE BY MOUTH EVERY DAY 07/19/21   Troy Sine, MD  dutasteride (AVODART) 0.5 MG capsule Take 0.5 mg by mouth daily. 11/24/21   [provider]  esomeprazole (NEXIUM) 40 MG capsule TAKE 1 CAPSULE (40 MG TOTAL) BY MOUTH 2 (TWO)  TIMES DAILY BEFORE A MEAL. 02/07/22 04/08/22  Cirigliano, Vito V, DO  ezetimibe (ZETIA) 10 MG tablet TAKE 1 TABLET BY MOUTH EVERY DAY 10/15/21   Troy Sine, MD  fluticasone [facility]) 50 MCG/ACT nasal spray Place 1 spray into both nostrils daily. 02/01/22   Garwin Brothers, MD  Gabapentin Enacarbil (HORIZANT) 600 MG TBCR Take 1 tablet (600 mg total) by mouth daily. At about 5PM 03/09/22   Garwin Brothers, MD  isosorbide mononitrate (IMDUR) 30 MG 24 hr tablet Take 1 tablet (30 mg total) by mouth daily. 02/12/22   Samuella Cota, MD  KLOR-CON M20 20 MEQ tablet Take 20 mEq by mouth once a week. On Wednesday's    [provider]  loratadine (CLARITIN) 10 MG tablet Take 10 mg by mouth daily as needed for allergies, rhinitis or itching.  12/12/13   [provider]  lovastatin (MEVACOR) 40 MG tablet TAKE 1 TABLET BY MOUTH EVERYDAY AT BEDTIME 02/14/22   Troy Sine, MD  metolazone (ZAROXOLYN) 2.5 MG tablet Take 2.5 mg by mouth once a week. On Wednesday's 09/24/18   [provider]  metoprolol succinate (TOPROL-XL) 25 MG 24 hr tablet Take 1 tablet (25 mg total) by mouth 2 (two) times daily. 02/11/22   Samuella Cota, MD  Multiple Vitamins-Minerals (MULTIVITAMINS THER. W/MINERALS) TABS Take 1 tablet by mouth every evening.    [provider]  nitroGLYCERIN (NITROLINGUAL) 0.4 MG/SPRAY spray PLACE 1 SPRAY UNDER THE TONGUE EVERY 5  (FIVE) MINUTES X 3 DOSES AS NEEDED FOR CHEST PAIN. 04/15/21   [name], MD  nitroGLYCERIN (NITROSTAT) 0.4 MG SL tablet PLACE 1 TABLET UNDER THE TONGUE EVERY 5 MINUTES AS NEEDED. Patient taking differently: Place 0.4 mg under the tongue every 5 (five) minutes as needed for chest pain. 02/03/22   Troy Sine, MD  ranolazine (RANEXA) 500 MG 12 hr tablet TAKE 1 TABLET BY MOUTH TWICE A DAY 02/08/22   Troy Sine, MD  senna-docusate (SENOKOT-S) 8.6-50 MG tablet Take 1 tablet by mouth daily. 09/05/18   Irene Shipper, MD  sucralfate (CARAFATE) 1 GM/10ML suspension Take 10 mLs (1 g total) by mouth 4 (four) times daily -  with meals and at bedtime. 09/05/18 02/21/22  Irene Shipper, MD  torsemide (DEMADEX) 20 MG tablet Take 80 mg by mouth 2 (two) times daily. 11/20/20   [provider]  vitamin E 200 UNIT capsule Take 200 Units by mouth every evening.    [provider]     Allergies:    Allergies  Allergen Reactions   Codeine Nausea And Vomiting   Sulfa Antibiotics Rash   Sulfonamide Derivatives Rash   Amiodarone     Developed tremor ultimately requiring discontinuation   Oxycodone Nausea Only    Social History:   Social History   Socioeconomic History   Marital status: Married    Spouse name: Not on file   Number of children: Not on file   Years of education: Not on file   Highest education level: Not on file  Occupational History   Not on file  Tobacco Use   Smoking status: Former    Packs/day: 1.00    Years: 38.00    Total pack years: 38.00    Types: Cigarettes    Quit date: 02/09/2005    Years since quitting: 17.1    Passive exposure: Never   Smokeless tobacco: Never   Tobacco comments:    "quit smoking cigarrettes 2006"  Vaping  Use   Vaping Use: Never used  Substance and Sexual Activity   Alcohol use: No    Alcohol/week: 0.0 standard drinks of alcohol   Drug use: No   Sexual activity: Not on file  Other Topics Concern   Not on file  Social  History Narrative   Not on file   Social Determinants of Health   Financial Resource Strain: Low Risk  (07/18/2018)   Overall Financial Resource Strain (CARDIA)    Difficulty of Paying Living Expenses: Not hard at all  Food Insecurity: No Food Insecurity (02/07/2022)   Hunger Vital Sign    Worried About Running Out of Food in the Last Year: Never true    Ran Out of Food in the Last Year: Never true  Transportation Needs: No Transportation Needs (02/07/2022)   PRAPARE - Hydrologist (Medical): No    Lack of Transportation (Non-Medical): No  Physical Activity: Inactive (07/18/2018)   Exercise Vital Sign    Days of Exercise per Week: 0 days    Minutes of Exercise per Session: 0 min  Stress: No Stress Concern Present (07/18/2018)   [place]    Feeling of Stress : Only a little  Social Connections: Unknown (07/18/2018)   Social Connection and Isolation Panel [NHANES]    Frequency of Communication with Friends and Family: Not on file    Frequency of Social Gatherings with Friends and Family: Not on file    Attends Religious Services: Not on file    Active Member of Clubs or Organizations: No    Attends Archivist Meetings: Never    Marital Status: Married  Human resources officer Violence: Not At Risk (02/07/2022)   Humiliation, Afraid, Rape, and Kick questionnaire    Fear of Current or Ex-Partner: No    Emotionally Abused: No    Physically Abused: No    Sexually Abused: No    Family History:   The patient's family history includes AAA (abdominal aortic aneurysm) in his father; Breast cancer in his paternal aunt; Diabetes in his sister; Heart attack in his father; Heart disease in his father; Heart failure in his father; Hypertension in his mother; Stroke in his mother; Ulcers in his father. There is no history of Colon cancer.    ROS:  Please see the history of present illness.  Review of  Systems  Unable to perform ROS: Critical illness    Physical Exam/Data:   Vitals:   03/24/2022 1645 03/16/2022 1700 04/05/2022 1715 03/14/2022 1730  BP: (!) 88/71 (!) 89/66 (!) 85/75 90/68  Pulse: (!) 113 99 100 92  Resp: (!) 22 (!) 24 (!) 31 (!) 26  Temp:      TempSrc:      SpO2: 100% 100% 100% 100%  Weight:      Height:       No intake or output data in the 24 hours ending 03/27/2022 1748    03/09/2022    2:51 PM 03/02/2022    8:33 AM 02/21/2022    9:45 AM  Last 3 Weights  Weight (lbs) 131 lb 131 lb 9.6 oz 128 lb  Weight (kg) 59.421 kg 59.693 kg 58.06 kg     Body mass index is 21.8 kg/m.   Exam per MD:  General: 77 y.o. cachetic Caucasian  male. On BiPAP and appears chronically ill and very uncomfortable and in distress. HEENT: Normocephalic and atraumatic. Sclera clear. EOMs intact. Neck: Supple. No  JVD. Heart: Tachycardic and irregular. Late peaking systolic murmur noted. Lungs: On BiPAP with increased work of breathing. Crackles noted throughout all lung fields.  Abdomen: Soft, non-distended, and non-tender to palpation. Extremities: 1-2+ pitting edema of bilateral lower extremities. Wounds on calf of right lower extremity. Skin: Cold and dry.  Neuro: No focal deficits. Psych: On BiPAP. Appears uncomfortable and in distress.  EKG:  The ECG that was done was personally reviewed. Initial EKG showed atrial fibrillation, rate 112 bpm, with RBBB and mild ST depressions in multiple leads. Repeat EKG showed VT with rate in the 190s.  Relevant CV Studies:  Myoview 07/09/2019: Nuclear stress EF: 48%. There was no ST segment deviation noted during stress. Defect 1: There is a large defect of severe severity present in the basal inferior, mid inferior and apical inferior location. Findings consistent with prior myocardial infarction with peri-infarct ischemia. This is a low risk study. The left ventricular ejection fraction is mildly decreased (45-54%).   Abnormal, low risk stress  nuclear study with prior inferior infarct and mild peri-infarct ischemia.  Gated ejection fraction 48% with inferior hypokinesis and mild left ventricular enlargement.  _______________  Echocardiogram 10/19/2021: Impressions:  1. Left ventricular ejection fraction, by estimation, is 60 to 65%. The  left ventricle has normal function. The left ventricle has no regional  wall motion abnormalities. There is mild left ventricular hypertrophy.  Indeterminate diastolic filling due to  E-A fusion.   2. Right ventricular systolic function is normal. The right ventricular  size is normal. There is normal pulmonary artery systolic pressure.   3. Left atrial size was mild to moderately dilated.   4. The mitral valve is normal in structure. Mild mitral valve  regurgitation. No evidence of mitral stenosis.   5. Visually the valve has moderate to severe stenosis. DI 0.23, mean  gradient only 19 mmHg, but AVA 0.78 cm2 SVi 34.. The aortic valve is  calcified. There is moderate calcification of the aortic valve. There is  moderate thickening of the aortic valve.  Aortic valve regurgitation is not visualized. Moderate to severe aortic  valve stenosis. Aortic valve area, by VTI measures 0.78 cm. Aortic valve  mean gradient measures 19.0 mmHg.   6. The inferior vena cava is normal in size with greater than 50%  respiratory variability, suggesting right atrial pressure of 3 mmHg.    Laboratory Data:  High Sensitivity Troponin:   Recent Labs  Lab 03/26/2022 1455  TROPONINIHS 10,290*      Chemistry Recent Labs  Lab 04/02/2022 1455 04/04/2022 1500 03/27/2022 1504  NA 136 135 137  K 3.6 3.9 3.6  CL 97*  --  100  CO2 19*  --   --   GLUCOSE 224*  --  211*  BUN 35*  --  33*  CREATININE 2.36*  --  2.30*  CALCIUM 8.8*  --   --   GFRNONAA 27*  --   --   ANIONGAP 20*  --   --     Recent Labs  Lab 03/24/2022 1455  PROT 6.9  ALBUMIN 3.3*  AST 59*  ALT 14  ALKPHOS 83  BILITOT 0.8   Lipids No results  for input(s): "CHOL", "TRIG", "HDL", "LABVLDL", "LDLCALC", "CHOLHDL" in the last 168 hours. Hematology Recent Labs  Lab 03/17/2022 1455 04/04/2022 1500 03/30/2022 1504  WBC 14.7*  --   --   RBC 3.69*  --   --   HGB 12.8* 11.9* 12.9*  HCT 37.7* 35.0* 38.0*  MCV  102.2*  --   --   MCH 34.7*  --   --   MCHC 34.0  --   --   RDW 14.0  --   --   PLT 192  --   --    Thyroid No results for input(s): "TSH", "FREET4" in the last 168 hours. BNP Recent Labs  Lab 03/18/2022 1455  BNP 916.6*    DDimer No results for input(s): "DDIMER" in the last 168 hours.   Radiology/Studies:  DG Chest Portable 1 View  Result Date: 04/02/2022 CLINICAL DATA:  Shortness of breath EXAM: PORTABLE CHEST 1 VIEW COMPARISON:  Chest x-ray dated February 10, 2022 FINDINGS: Cardiac and mediastinal contours are unchanged status post median sternotomy. Moderate right pleural effusion, increased in size when compared with prior exam. Central predominant interstitial opacities, increased when compared with the prior. No evidence of pneumothorax. IMPRESSION: 1. Moderate right pleural effusion, increased in size when compared with prior exam. 2. Increased interstitial opacities, likely due to worsened pulmonary edema. Electronically Signed   By: Yetta Glassman M.D.   On: 03/19/2022 15:17     Assessment and Plan:   NSTEMI CAD Ventricular Tachycardia Severe Aortic Stenosis Acute Hypoxic Respiratory Failure  Acute on Chronic Diastolic CHF Patient as complex medical history. She has a long history of CAD with CABG in 1984 in [place] with redo CABG in 1996 and multiple subsequent PCIs (last PCI in 2012). He has has significant peripheral vascular disease involving lower extremities, carotids, and renal arteries and has had multiple intervention. In addition, she has known moderate to severe aortic stenosis on Echo in 10/2021. She was recently admitted in 02/2022 with NSTEMI. High-sensitivity troponin peaked at 1,717 at that time.  He was not felt to be a candidate for any invasive work-up (including cardiac catheterization and TAVR) due to multiple comorbiditis including renal function and limited vascular access due to severe PAD. He was treated medically and diuresed. It sounds like he was doing well since discharge until this morning when he developed recurrent chest pain and shortness of breath. En route to the ED, he reportedly went into VT and was successful cardioverted. On arrival to the ED, he is hypotensive, tachycardia, tachypneic, and hypoxic. He was placed on BiPAP. He had recurrent VT in the ED. He came out of this on his own after about 20 seconds. Initial high-sensitivity troponin elevated in the 10,000s. BNP in the 900s. Chest x-ray showed moderate right pleural effusion (increased in size from prior exam) and increased interstitial opacities likely due to worsened pulmonary edema. Creatinine 2.36. Lactic acid 5. BP currently in the 80s/60s. He is cold on exam Unfortunately, we do not have much to offer the patient. He is not a candidate for any invasive work-up including cardiac catheterization an TAVR. His prognosis is very poor. Dr. Johney Frame had a long discussion with the family. There is a high chance that patient may not survive the next 24-48 hours. Recommend palliative care consultation and discussion goals of care. If family decides to proceed with full scope of care, can treat medically with IV Heparin and IV diuresis but would need inotropes. Will defer this to PCCM.  Permanent Atrial Fibrillation Currently in atrial fibrillation with rates in the 110s to 120s. Unable to use AV nodal agents at this time due to hypotension. Not on chronic anticoagulation due to history of GI bleeds and anemia. Not a candidate for Watchman per EP.  CKD Stage IIIB Creatinine 2.36 on admission. Recent baseline  around 1.9 to 2.3.  Otherwise, per primary team: - PAD - Hyperlipidemia - GERD - Chronic anemia - Leukocytosis -  Lactic acidosis   Risk Assessment/Risk Scores:   TIMI Risk Score for Unstable Angina or Non-ST Elevation MI:   The patient's TIMI risk score is 6, which indicates a 41% risk of all cause mortality, new or recurrent myocardial infarction or need for urgent revascularization in the next 14 days.{   New York Heart Association (NYHA) Functional Class NYHA Class IV  CHA2DS2-VASc Score = 7  This indicates a 11.2% annual risk of stroke. The patient's score is based upon: CHF History: 1 HTN History: 1 Diabetes History: 0 Stroke History: 2 Vascular Disease History: 1 Age Score: 2 Gender Score: 0   For questions or updates, please contact [place] Please consult [email or web address] for contact info under     Signed, Eppie Gibson  03/21/2022 5:48 PM   Patient seen and examined and agree with Sande Rives, PA-C as detailed above.  In brief, the patient is a 77 y.o. male with a history of CAD s/p CABG in 1984 in [place] with redo CABG in 1996 and multiple subsequent PCIs,  permanent atrial fibrillation not on anticoagulation due to GI bleeds and anemia, lymphedema, PAD with bilateral SFA occlusion s/p left to right fem-fem bypass in 07/2019, carotid artery disease s/p right CEA in 2005, chronic left subclavian artery stenosis, renal artery stenosis s/p right renal artery stenting and repeat intervention for in-stent restenosis, hypertension, hyperlipidemia, CKD stage III, GERD with gastritis/ esophagitis, chronic anemia, and severe aortic stenosis deemed not to be a TAVR candidate who is being seen for chest pain and SOB.  The patient has extensive medical history with known CAD s/p CABG in 1984 with redo in 1996 and multiple subsequent PCIs, severe AS deemed not to be TAVR candidate, permanent Afib, and severe PAD. Family called EMS today due to the patient having worsening chest pain and SOB. En route to the ER, patient developed VT requiring shock in the field. On  arrival here, had another episode of recurrent VT but converted to Afib without intervention. Labs on arrival notable for trop 10290, BNP 916, lactate 5, Cr 2.36. CXR with moderate pleural effusions and PAD. He was visibly dyspneic and tachypneic requiring BiPAP. Hypotensive with BP in 80/60s.  Overall, patient either had an acute coronary event or became hypotensive and developed significant cardiac ischemia in the setting of severe AS. Unfortunately, given his significant comorbid conditions and overall poor prognosis, he is not a candidate for invasive cardiac procedures. He is notably DNR/DNI and I agree that CPR would cause more harm than benefit at this stage. This was discussed extensively with the family at bedside. Recommend palliative care evaluation.   GEN: Frail, chronically ill appearing, cachectic   Neck: JVD to the angle of the mandible Cardiac: Irregular, tachycardic, 2/6 harsh late peaking systolic murmur Respiratory: Crackles bilaterally. On BiPAP and tachypneic GI: Soft, nontender, non-distended  MS: Cold. 1+ edema. Multiple draining wounds on the calves Neuro:  Opens eyes to voice. Not answering questions Psych: Unable to asses  Plan: -Patient is not a candidate for invasive procedures from a CV perspective -Given his significant comorbid conditions, tenuous HD status, and overall poor prognosis with limited therapeutic options, recommended palliative evaluation -Patient is DNR/DNI; on BiPAP currently -Will be admitted to ICU for monitoring  Discussed plan extensively with the patient's family at beside. They will continue ongoing [name] discussions with PCCM  team.  Gwyndolyn Kaufman, MD

## 2022-03-31 NOTE — Telephone Encounter (Signed)
Pt c/o of Chest Pain: STAT if CP now or developed within 24 hours  1. Are you having CP right now? No, but occurred this morning   2. Are you experiencing any other symptoms (ex. SOB, nausea, vomiting, sweating)? Yes, SOB   3. How long have you been experiencing CP? Yes   4. Is your CP continuous or coming and going? Continuous   5. Have you taken Nitroglycerin? Yes, relieved partially and then took an imdur.  ?

## 2022-03-31 NOTE — ED Triage Notes (Signed)
BIB EMS with [place] started having chest pain and took 1 nitro which helped then called ems due to pain returning. Increased SOB AMS.  With EMS was intitally in afib and then went into vtach ems cardioverted.  Patient lethargic at this time. Initial sats in 70s placed on NRB.  Alaso had wound on right leg which is hot to the touch.

## 2022-03-31 NOTE — Telephone Encounter (Signed)
Spoke with tina,[facility] nurse, she reports she is at the patients home and his wife reports that he had chest pain this morning with SOB. He took 1 NTG and it has eased some but she reports he is still uncomfortable. His wife placed his oxygen on him. His bp is 110/60 but she reports he is very weak and his heart rate was elevated with the chest pain. She was calling to give [place] an update and they are trying to get him to go to the ER.

## 2022-04-01 ENCOUNTER — Inpatient Hospital Stay (HOSPITAL_COMMUNITY): Payer: Medicare PPO

## 2022-04-01 LAB — BLOOD CULTURE ID PANEL (REFLEXED) - BCID2

## 2022-04-01 LAB — BASIC METABOLIC PANEL
Anion gap: 13 (ref 5–15)
BUN: 40 mg/dL — ABNORMAL HIGH (ref 8–23)
CO2: 21 mmol/L — ABNORMAL LOW (ref 22–32)
Calcium: 8.2 mg/dL — ABNORMAL LOW (ref 8.9–10.3)
Chloride: 101 mmol/L (ref 98–111)
Creatinine, Ser: 2.33 mg/dL — ABNORMAL HIGH (ref 0.61–1.24)
GFR, Estimated: 28 mL/min — ABNORMAL LOW (ref >60)
Glucose, Bld: 141 mg/dL — ABNORMAL HIGH (ref 70–99)
Potassium: 3.9 mmol/L (ref 3.5–5.1)
Sodium: 135 mmol/L (ref 135–145)

## 2022-04-01 LAB — MAGNESIUM: Magnesium: 2 mg/dL (ref 1.7–2.4)

## 2022-04-01 MED ORDER — AMIODARONE IV BOLUS ONLY 150 MG/100ML: 150.0000 mg | Freq: Once | INTRAVENOUS | Status: DC

## 2022-04-01 MED ORDER — LIDOCAINE BOLUS VIA INFUSION
50.0000 mg | Freq: Once | INTRAVENOUS | Status: AC
  Administered 2022-04-01: 50 mg via INTRAVENOUS
  Filled 2022-04-01: qty 52

## 2022-04-01 MED ORDER — MAGNESIUM SULFATE 2 GM/50ML IV SOLN
INTRAVENOUS | Status: AC
  Filled 2022-04-01: qty 50

## 2022-04-01 MED ORDER — MAGNESIUM SULFATE 2 GM/50ML IV SOLN
2.0000 g | Freq: Once | INTRAVENOUS | Status: AC
  Administered 2022-04-01: 2 g via INTRAVENOUS

## 2022-04-01 MED ORDER — LIDOCAINE IN D5W 4-5 MG/ML-% IV SOLN
1.0000 mg/min | INTRAVENOUS | Status: DC
  Administered 2022-04-01: 1 mg/min via INTRAVENOUS
  Filled 2022-04-01: qty 500

## 2022-04-01 MED ORDER — AMIODARONE IV BOLUS ONLY 150 MG/100ML
INTRAVENOUS | Status: AC
  Filled 2022-04-01: qty 100

## 2022-04-02 LAB — CALCIUM, IONIZED: Calcium, Ionized, Serum: 4.3 mg/dL — ABNORMAL LOW (ref 4.5–5.6)

## 2022-04-03 LAB — CULTURE, BLOOD (ROUTINE X 2)
Special Requests: ADEQUATE
Special Requests: ADEQUATE

## 2022-04-04 ENCOUNTER — Ambulatory Visit: Payer: Medicare PPO | Admitting: Internal Medicine

## 2022-04-07 ENCOUNTER — Other Ambulatory Visit: Payer: Self-pay | Admitting: Internal Medicine

## 2022-04-07 ENCOUNTER — Ambulatory Visit: Payer: Medicare PPO | Admitting: Nurse Practitioner

## 2022-04-07 NOTE — Death Summary Note (Signed)
DEATH SUMMARY   Patient Details  Name: [name] MRN: [id number] DOB: 1945/11/23  Admission/Discharge Information   Admit Date:  04-26-2022  Date of Death: Date of Death: 04-27-2022  Time of Death: Time of Death: 0252  Length of Stay: 1  Referring Physician: Garwin Brothers, MD   Reason(s) for Hospitalization  {Trauma:[id number]::"***"}  Diagnoses  Preliminary cause of death:  Secondary Diagnoses (including complications and co-morbidities):  Principal Problem:   V-tach [facility])   Brief Hospital Course (including significant findings, care, treatment, and services provided and events leading to death)  [name] is a 77 y.o. year old male who ***    Pertinent Labs and Studies  Significant Diagnostic Studies DG Chest Portable 1 View  Result Date: April 26, 2022 CLINICAL DATA:  Shortness of breath EXAM: PORTABLE CHEST 1 VIEW COMPARISON:  Chest x-ray dated February 10, 2022 FINDINGS: Cardiac and mediastinal contours are unchanged status post median sternotomy. Moderate right pleural effusion, increased in size when compared with prior exam. Central predominant interstitial opacities, increased when compared with the prior. No evidence of pneumothorax. IMPRESSION: 1. Moderate right pleural effusion, increased in size when compared with prior exam. 2. Increased interstitial opacities, likely due to worsened pulmonary edema. Electronically Signed   By: Yetta Glassman M.D.   On: 2022-04-26 15:17    Microbiology Recent Results (from the past 240 hour(s))  Culture, blood (Routine x 2)     Status: Abnormal   Collection Time: 26-Apr-2022  2:55 PM   Specimen: BLOOD  Result Value Ref Range Status   Specimen Description BLOOD SITE NOT SPECIFIED  Final   Special Requests   Final    BOTTLES DRAWN AEROBIC AND ANAEROBIC Blood Culture adequate volume   Culture  Setup Time   Final    GRAM NEGATIVE RODS IN BOTH AEROBIC AND ANAEROBIC BOTTLES PATIENT DISCHARGED OR EXPIRED Performed at [facility], [address] [place], [place] [address]    Culture SERRATIA MARCESCENS (A)  Final   Report Status 04/03/2022 FINAL  Final   Organism ID, Bacteria SERRATIA MARCESCENS  Final      Susceptibility   Serratia marcescens - MIC*    CEFAZOLIN >=64 RESISTANT Resistant     CEFEPIME <=0.12 SENSITIVE Sensitive     CEFTAZIDIME <=1 SENSITIVE Sensitive     CEFTRIAXONE <=0.25 SENSITIVE Sensitive     CIPROFLOXACIN <=0.25 SENSITIVE Sensitive     GENTAMICIN <=1 SENSITIVE Sensitive     TRIMETH/SULFA <=20 SENSITIVE Sensitive     * SERRATIA MARCESCENS  Blood Culture ID Panel (Reflexed)     Status: Abnormal   Collection Time: 2022-04-26  2:55 PM  Result Value Ref Range Status   Enterococcus faecalis NOT DETECTED NOT DETECTED Final   Enterococcus Faecium NOT DETECTED NOT DETECTED Final   Listeria monocytogenes NOT DETECTED NOT DETECTED Final   Staphylococcus species NOT DETECTED NOT DETECTED Final   Staphylococcus aureus (BCID) NOT DETECTED NOT DETECTED Final   Staphylococcus epidermidis NOT DETECTED NOT DETECTED Final   Staphylococcus lugdunensis NOT DETECTED NOT DETECTED Final   Streptococcus species NOT DETECTED NOT DETECTED Final   Streptococcus agalactiae NOT DETECTED NOT DETECTED Final   Streptococcus pneumoniae NOT DETECTED NOT DETECTED Final   Streptococcus pyogenes NOT DETECTED NOT DETECTED Final   A.calcoaceticus-baumannii NOT DETECTED NOT DETECTED Final   Bacteroides fragilis NOT DETECTED NOT DETECTED Final   Enterobacterales DETECTED (A) NOT DETECTED Final    Comment: Enterobacterales represent a large order of gram negative bacteria, not a single  organism.   Enterobacter cloacae complex NOT DETECTED NOT DETECTED Final   Escherichia coli NOT DETECTED NOT DETECTED Final   Klebsiella aerogenes NOT DETECTED NOT DETECTED Final   Klebsiella oxytoca NOT DETECTED NOT DETECTED Final   Klebsiella pneumoniae NOT DETECTED NOT DETECTED Final   Proteus species NOT DETECTED NOT DETECTED  Final   Salmonella species NOT DETECTED NOT DETECTED Final   Serratia marcescens DETECTED (A) NOT DETECTED Final    Comment: PATIENT DISCHARGED OR EXPIRED   Haemophilus influenzae NOT DETECTED NOT DETECTED Final   Neisseria meningitidis NOT DETECTED NOT DETECTED Final   Pseudomonas aeruginosa NOT DETECTED NOT DETECTED Final   Stenotrophomonas maltophilia NOT DETECTED NOT DETECTED Final   Candida albicans NOT DETECTED NOT DETECTED Final   Candida auris NOT DETECTED NOT DETECTED Final   Candida glabrata NOT DETECTED NOT DETECTED Final   Candida krusei NOT DETECTED NOT DETECTED Final   Candida parapsilosis NOT DETECTED NOT DETECTED Final   Candida tropicalis NOT DETECTED NOT DETECTED Final   Cryptococcus neoformans/gattii NOT DETECTED NOT DETECTED Final   CTX-M ESBL NOT DETECTED NOT DETECTED Final   Carbapenem resistance IMP NOT DETECTED NOT DETECTED Final   Carbapenem resistance KPC NOT DETECTED NOT DETECTED Final   Carbapenem resistance NDM NOT DETECTED NOT DETECTED Final   Carbapenem resist OXA 48 LIKE NOT DETECTED NOT DETECTED Final   Carbapenem resistance VIM NOT DETECTED NOT DETECTED Final    Comment: Performed at [facility], [address], [place], [place] [address]  Resp panel by RT-PCR (RSV, Flu A&B, Covid)     Status: None   Collection Time: 03/15/2022  3:04 PM   Specimen: Nasal Swab  Result Value Ref Range Status   SARS Coronavirus 2 by RT PCR NEGATIVE NEGATIVE Final    Comment: (NOTE) SARS-CoV-2 target nucleic acids are NOT DETECTED.  The SARS-CoV-2 RNA is generally detectable in upper respiratory specimens during the acute phase of infection. The lowest concentration of SARS-CoV-2 viral copies this assay can detect is 138 copies/mL. A negative result does not preclude SARS-Cov-2 infection and should not be used as the sole basis for treatment or other patient management decisions. A negative result may occur with  improper specimen collection/handling, submission  of specimen other than nasopharyngeal swab, presence of viral mutation(s) within the areas targeted by this assay, and inadequate number of viral copies(<138 copies/mL). A negative result must be combined with clinical observations, patient history, and epidemiological information. The expected result is Negative.  Fact Sheet for Patients:  EntrepreneurPulse.com.au  Fact Sheet for Healthcare Providers:  IncredibleEmployment.be  This test is no t yet approved or cleared by the [place] FDA and  has been authorized for detection and/or diagnosis of SARS-CoV-2 by FDA under an Emergency Use Authorization (EUA). This EUA will remain  in effect (meaning this test can be used) for the duration of the COVID-19 declaration under Section 564(b)(1) of the Act, 21 U.S.C.section 360bbb-3(b)(1), unless the authorization is terminated  or revoked sooner.       Influenza A by PCR NEGATIVE NEGATIVE Final   Influenza B by PCR NEGATIVE NEGATIVE Final    Comment: (NOTE) The Xpert Xpress SARS-CoV-2/FLU/RSV plus assay is intended as an aid in the diagnosis of influenza from Nasopharyngeal swab specimens and should not be used as a sole basis for treatment. Nasal washings and aspirates are unacceptable for Xpert Xpress SARS-CoV-2/FLU/RSV testing.  Fact Sheet for Patients: EntrepreneurPulse.com.au  Fact Sheet for Healthcare Providers: IncredibleEmployment.be  This test is  not yet approved or cleared by the [place] and has been authorized for detection and/or diagnosis of SARS-CoV-2 by FDA under an Emergency Use Authorization (EUA). This EUA will remain in effect (meaning this test can be used) for the duration of the COVID-19 declaration under Section 564(b)(1) of the Act, 21 U.S.C. section 360bbb-3(b)(1), unless the authorization is terminated or revoked.     Resp Syncytial Virus by PCR NEGATIVE NEGATIVE  Final    Comment: (NOTE) Fact Sheet for Patients: EntrepreneurPulse.com.au  Fact Sheet for Healthcare Providers: IncredibleEmployment.be  This test is not yet approved or cleared by the [place] FDA and has been authorized for detection and/or diagnosis of SARS-CoV-2 by FDA under an Emergency Use Authorization (EUA). This EUA will remain in effect (meaning this test can be used) for the duration of the COVID-19 declaration under Section 564(b)(1) of the Act, 21 U.S.C. section 360bbb-3(b)(1), unless the authorization is terminated or revoked.  Performed at [facility], [place] [address], [place], [place] [address]   Culture, blood (Routine x 2)     Status: Abnormal   Collection Time: 03/07/2022  3:07 PM   Specimen: BLOOD  Result Value Ref Range Status   Specimen Description BLOOD SITE NOT SPECIFIED  Final   Special Requests   Final    BOTTLES DRAWN AEROBIC AND ANAEROBIC Blood Culture adequate volume   Culture  Setup Time   Final    GRAM NEGATIVE RODS IN BOTH AEROBIC AND ANAEROBIC BOTTLES CRITICAL VALUE NOTED.  VALUE IS CONSISTENT WITH PREVIOUSLY REPORTED AND CALLED VALUE.    Culture (A)  Final    SERRATIA MARCESCENS SUSCEPTIBILITIES PERFORMED ON PREVIOUS CULTURE WITHIN THE LAST 5 DAYS. Performed at [facility], [place] [address] [place], [place] [address]    Report Status 04/03/2022 FINAL  Final  MRSA Next Gen by PCR, Nasal     Status: None   Collection Time: 03/09/2022  8:27 PM   Specimen: Nasal Mucosa; Nasal Swab  Result Value Ref Range Status   MRSA by PCR Next Gen NOT DETECTED NOT DETECTED Final    Comment: (NOTE) The GeneXpert MRSA Assay (FDA approved for NASAL specimens only), is one component of a comprehensive MRSA colonization surveillance program. It is not intended to diagnose MRSA infection nor to guide or monitor treatment for MRSA infections. Test performance is not FDA approved in patients less than 28  years old. Performed at [facility], [place] [address] [place], [place] [address]     Lab Basic Metabolic Panel: Recent Labs  Lab 03/15/2022 1455 03/25/2022 1500 03/29/2022 1504 03/11/2022 2008 03/26/2022 2141 04-15-2022 0124  NA 136 135 137 135  --  135  K 3.6 3.9 3.6 4.5  --  3.9  CL 97*  --  100  --   --  101  CO2 19*  --   --   --   --  21*  GLUCOSE 224*  --  211*  --   --  141*  BUN 35*  --  33*  --   --  40*  CREATININE 2.36*  --  2.30*  --   --  2.33*  CALCIUM 8.8*  --   --   --   --  8.2*  MG  --   --   --   --  2.1 2.0   Liver Function Tests: Recent Labs  Lab 03/12/2022 1455  AST 59*  ALT 14  ALKPHOS 83  BILITOT 0.8  PROT 6.9  ALBUMIN 3.3*   No results for input(s): "LIPASE", "AMYLASE" in the last 168 hours. No results for input(s): "AMMONIA" in the last 168 hours. CBC: Recent Labs  Lab 03/26/2022 1455 03/28/2022 1500 03/18/2022 1504 04/06/2022 2008  WBC 14.7*  --   --   --   NEUTROABS 13.1*  --   --   --   HGB 12.8* 11.9* 12.9* 11.9*  HCT 37.7* 35.0* 38.0* 35.0*  MCV 102.2*  --   --   --   PLT 192  --   --   --    Cardiac Enzymes: No results for input(s): "CKTOTAL", "CKMB", "CKMBINDEX", "TROPONINI" in the last 168 hours. Sepsis Labs: Recent Labs  Lab 04/03/2022 1455 04/05/2022 1456 03/11/2022 1656  WBC 14.7*  --   --   LATICACIDVEN  --  5.0* 2.3*    Procedures/Operations  ***   Freddi Starr 04/05/2022, 7:57 AM

## 2022-04-07 NOTE — Progress Notes (Signed)
eLink Physician-Brief Progress Note Patient Name: [name] DOB: 1946/01/12 MRN: [id number]   Date of Service  Apr 10, 2022  HPI/Events of Note  Patient went into ventricular tachycardia.  eICU Interventions  Lidocaine 100 mg iv bolus followed by increase in the Lidocaine gtt to 2 mg / min, VT persisted and Amiodarone 150 mg iv x 1 was ordered but the patient  became asystolic before the Amiodarone could be given. He was not resuscitated in accordance with his DNR status, wife and son were in the room.        [name] April 10, 2022, 2:49 AM

## 2022-04-07 NOTE — Progress Notes (Signed)
Patient went into VT heart rhythm at 02:31. This nurse when to assess patient, he was unresponsive he was did not have a pulse. Patient Code is DNR, This nurse called [facility] for orders. MD. Ordered a lidocaine bolus, gave bolus with no improvement on patients status. MD order 2 g of mag, started infusing mg. MD. Order Amio bolus patient went Asystole before bolus.    York Cerise RN.

## 2022-04-07 NOTE — Progress Notes (Signed)
  Patient asystole at 02:50 . Auscultated heart and lungs sounds for 2 minutes, no sounds noted. Second RN verified: York Cerise RN and Garner Gavel. Pt pronounced at 02:52 on 04/29/22.

## 2022-04-07 DEATH — deceased

## 2022-04-13 ENCOUNTER — Encounter (HOSPITAL_COMMUNITY): Payer: Medicare PPO

## 2022-04-13 ENCOUNTER — Ambulatory Visit: Payer: Medicare PPO | Admitting: Vascular Surgery

## 2022-04-20 ENCOUNTER — Ambulatory Visit: Payer: Medicare PPO | Admitting: Cardiovascular Disease

## 2022-04-25 ENCOUNTER — Ambulatory Visit: Payer: Medicare PPO | Admitting: Internal Medicine

## 2022-04-28 ENCOUNTER — Ambulatory Visit: Payer: Medicare PPO | Admitting: Oncology

## 2022-04-28 ENCOUNTER — Other Ambulatory Visit: Payer: Medicare PPO
